# Patient Record
Sex: Female | Born: 1940
Health system: Southern US, Community
[De-identification: ages and names within clinical notes are randomized; demographics above are authoritative.]

## PROBLEM LIST (undated history)

## (undated) DIAGNOSIS — N189 Chronic kidney disease, unspecified: Secondary | ICD-10-CM

## (undated) DIAGNOSIS — I10 Essential (primary) hypertension: Secondary | ICD-10-CM

## (undated) DIAGNOSIS — F0390 Unspecified dementia without behavioral disturbance: Secondary | ICD-10-CM

## (undated) DIAGNOSIS — R51 Headache: Secondary | ICD-10-CM

## (undated) DIAGNOSIS — D649 Anemia, unspecified: Secondary | ICD-10-CM

## (undated) DIAGNOSIS — K219 Gastro-esophageal reflux disease without esophagitis: Secondary | ICD-10-CM

## (undated) DIAGNOSIS — U071 COVID-19: Secondary | ICD-10-CM

## (undated) DIAGNOSIS — M1990 Unspecified osteoarthritis, unspecified site: Secondary | ICD-10-CM

## (undated) DIAGNOSIS — E119 Type 2 diabetes mellitus without complications: Secondary | ICD-10-CM

## (undated) DIAGNOSIS — R519 Headache, unspecified: Secondary | ICD-10-CM

## (undated) DIAGNOSIS — M109 Gout, unspecified: Secondary | ICD-10-CM

## (undated) HISTORY — PX: DIALYSIS/PERMA CATHETER INSERTION: CATH118288

## (undated) HISTORY — PX: ABDOMINAL HYSTERECTOMY: SHX81

---

## 2000-05-15 ENCOUNTER — Encounter: Payer: Self-pay | Admitting: Nephrology

## 2000-05-15 ENCOUNTER — Encounter: Admission: RE | Admit: 2000-05-15 | Discharge: 2000-05-15 | Payer: Self-pay | Admitting: Nephrology

## 2007-09-17 ENCOUNTER — Ambulatory Visit (HOSPITAL_COMMUNITY): Admission: RE | Admit: 2007-09-17 | Discharge: 2007-09-17 | Payer: Self-pay | Admitting: Internal Medicine

## 2007-10-04 ENCOUNTER — Ambulatory Visit (HOSPITAL_COMMUNITY): Admission: RE | Admit: 2007-10-04 | Discharge: 2007-10-04 | Payer: Self-pay | Admitting: Internal Medicine

## 2010-05-02 ENCOUNTER — Other Ambulatory Visit: Admission: RE | Admit: 2010-05-02 | Discharge: 2010-05-02 | Payer: Self-pay | Admitting: Internal Medicine

## 2012-09-17 ENCOUNTER — Other Ambulatory Visit: Payer: Self-pay | Admitting: Internal Medicine

## 2012-09-17 DIAGNOSIS — Z780 Asymptomatic menopausal state: Secondary | ICD-10-CM

## 2012-09-17 DIAGNOSIS — Z1231 Encounter for screening mammogram for malignant neoplasm of breast: Secondary | ICD-10-CM

## 2012-10-03 ENCOUNTER — Ambulatory Visit
Admission: RE | Admit: 2012-10-03 | Discharge: 2012-10-03 | Disposition: A | Discharge status: Home / Self Care | Payer: 59 | Source: Ambulatory Visit | Attending: Internal Medicine | Admitting: Internal Medicine

## 2012-10-03 DIAGNOSIS — Z1231 Encounter for screening mammogram for malignant neoplasm of breast: Secondary | ICD-10-CM

## 2012-10-03 DIAGNOSIS — Z780 Asymptomatic menopausal state: Secondary | ICD-10-CM

## 2014-03-04 ENCOUNTER — Encounter (HOSPITAL_COMMUNITY): Payer: Self-pay | Admitting: Emergency Medicine

## 2014-03-04 ENCOUNTER — Inpatient Hospital Stay (HOSPITAL_COMMUNITY)
Admission: EM | Admit: 2014-03-04 | Discharge: 2014-03-06 | DRG: 684 | Disposition: A | Discharge status: Home / Self Care | Payer: No Typology Code available for payment source | Attending: Internal Medicine | Admitting: Internal Medicine

## 2014-03-04 DIAGNOSIS — N182 Chronic kidney disease, stage 2 (mild): Secondary | ICD-10-CM | POA: Diagnosis present

## 2014-03-04 DIAGNOSIS — IMO0002 Reserved for concepts with insufficient information to code with codable children: Secondary | ICD-10-CM

## 2014-03-04 DIAGNOSIS — Z9119 Patient's noncompliance with other medical treatment and regimen: Secondary | ICD-10-CM

## 2014-03-04 DIAGNOSIS — E785 Hyperlipidemia, unspecified: Secondary | ICD-10-CM | POA: Diagnosis present

## 2014-03-04 DIAGNOSIS — N179 Acute kidney failure, unspecified: Principal | ICD-10-CM

## 2014-03-04 DIAGNOSIS — I129 Hypertensive chronic kidney disease with stage 1 through stage 4 chronic kidney disease, or unspecified chronic kidney disease: Secondary | ICD-10-CM | POA: Diagnosis present

## 2014-03-04 DIAGNOSIS — E860 Dehydration: Secondary | ICD-10-CM

## 2014-03-04 DIAGNOSIS — Z91199 Patient's noncompliance with other medical treatment and regimen due to unspecified reason: Secondary | ICD-10-CM

## 2014-03-04 DIAGNOSIS — M109 Gout, unspecified: Secondary | ICD-10-CM | POA: Diagnosis present

## 2014-03-04 DIAGNOSIS — E1365 Other specified diabetes mellitus with hyperglycemia: Secondary | ICD-10-CM | POA: Diagnosis present

## 2014-03-04 DIAGNOSIS — E1349 Other specified diabetes mellitus with other diabetic neurological complication: Secondary | ICD-10-CM | POA: Diagnosis present

## 2014-03-04 DIAGNOSIS — Z91148 Patient's other noncompliance with medication regimen for other reason: Secondary | ICD-10-CM

## 2014-03-04 DIAGNOSIS — R739 Hyperglycemia, unspecified: Secondary | ICD-10-CM

## 2014-03-04 DIAGNOSIS — R7309 Other abnormal glucose: Secondary | ICD-10-CM

## 2014-03-04 DIAGNOSIS — Z9114 Patient's other noncompliance with medication regimen: Secondary | ICD-10-CM

## 2014-03-04 DIAGNOSIS — R197 Diarrhea, unspecified: Secondary | ICD-10-CM

## 2014-03-04 DIAGNOSIS — I10 Essential (primary) hypertension: Secondary | ICD-10-CM

## 2014-03-04 DIAGNOSIS — Z794 Long term (current) use of insulin: Secondary | ICD-10-CM

## 2014-03-04 HISTORY — DX: Type 2 diabetes mellitus without complications: E11.9

## 2014-03-04 LAB — COMPREHENSIVE METABOLIC PANEL
ALK PHOS: 122 U/L — AB (ref 39–117)
ALT: 12 U/L (ref 0–35)
AST: 12 U/L (ref 0–37)
Albumin: 3.3 g/dL — ABNORMAL LOW (ref 3.5–5.2)
BUN: 58 mg/dL — ABNORMAL HIGH (ref 6–23)
CALCIUM: 9.2 mg/dL (ref 8.4–10.5)
CO2: 20 meq/L (ref 19–32)
Chloride: 100 mEq/L (ref 96–112)
Creatinine, Ser: 2.92 mg/dL — ABNORMAL HIGH (ref 0.50–1.10)
GFR, EST AFRICAN AMERICAN: 18 mL/min — AB (ref >90)
GFR, EST NON AFRICAN AMERICAN: 15 mL/min — AB (ref >90)
GLUCOSE: 156 mg/dL — AB (ref 70–99)
POTASSIUM: 4 meq/L (ref 3.7–5.3)
SODIUM: 136 meq/L — AB (ref 137–147)
TOTAL PROTEIN: 7.5 g/dL (ref 6.0–8.3)
Total Bilirubin: <0.2 mg/dL — ABNORMAL LOW (ref 0.3–1.2)

## 2014-03-04 LAB — CBC WITH DIFFERENTIAL/PLATELET
BASOS ABS: 0 10*3/uL (ref 0.0–0.1)
Basophils Relative: 0 % (ref 0–1)
EOS PCT: 2 % (ref 0–5)
Eosinophils Absolute: 0.1 10*3/uL (ref 0.0–0.7)
HCT: 33.4 % — ABNORMAL LOW (ref 36.0–46.0)
Hemoglobin: 11.1 g/dL — ABNORMAL LOW (ref 12.0–15.0)
LYMPHS ABS: 1.6 10*3/uL (ref 0.7–4.0)
LYMPHS PCT: 25 % (ref 12–46)
MCH: 27.1 pg (ref 26.0–34.0)
MCHC: 33.2 g/dL (ref 30.0–36.0)
MCV: 81.5 fL (ref 78.0–100.0)
Monocytes Absolute: 0.4 10*3/uL (ref 0.1–1.0)
Monocytes Relative: 7 % (ref 3–12)
NEUTROS PCT: 66 % (ref 43–77)
Neutro Abs: 4.1 10*3/uL (ref 1.7–7.7)
PLATELETS: 266 10*3/uL (ref 150–400)
RBC: 4.1 MIL/uL (ref 3.87–5.11)
RDW: 13.9 % (ref 11.5–15.5)
WBC: 6.3 10*3/uL (ref 4.0–10.5)

## 2014-03-04 LAB — CBG MONITORING, ED
Glucose-Capillary: 147 mg/dL — ABNORMAL HIGH (ref 70–99)
Glucose-Capillary: 96 mg/dL (ref 70–99)

## 2014-03-04 LAB — GLUCOSE, CAPILLARY: Glucose-Capillary: 187 mg/dL — ABNORMAL HIGH (ref 70–99)

## 2014-03-04 MED ORDER — SODIUM CHLORIDE 0.9 % IV SOLN
INTRAVENOUS | Status: DC
  Administered 2014-03-05: 800 mL via INTRAVENOUS
  Administered 2014-03-05: 01:00:00 via INTRAVENOUS

## 2014-03-04 MED ORDER — EZETIMIBE 10 MG PO TABS
10.0000 mg | ORAL_TABLET | Freq: Every day | ORAL | Status: DC
  Administered 2014-03-05 – 2014-03-06 (×2): 10 mg via ORAL
  Filled 2014-03-04 (×2): qty 1

## 2014-03-04 MED ORDER — INSULIN ASPART 100 UNIT/ML ~~LOC~~ SOLN
0.0000 [IU] | Freq: Three times a day (TID) | SUBCUTANEOUS | Status: DC
  Administered 2014-03-05 (×2): 5 [IU] via SUBCUTANEOUS
  Administered 2014-03-06: 3 [IU] via SUBCUTANEOUS

## 2014-03-04 MED ORDER — PNEUMOCOCCAL VAC POLYVALENT 25 MCG/0.5ML IJ INJ
0.5000 mL | INJECTION | INTRAMUSCULAR | Status: DC
  Filled 2014-03-04: qty 0.5

## 2014-03-04 MED ORDER — AMLODIPINE BESYLATE 10 MG PO TABS
10.0000 mg | ORAL_TABLET | Freq: Every day | ORAL | Status: DC
  Administered 2014-03-05 – 2014-03-06 (×2): 10 mg via ORAL
  Filled 2014-03-04 (×2): qty 1

## 2014-03-04 MED ORDER — SODIUM CHLORIDE 0.9 % IV SOLN: INTRAVENOUS | Status: DC

## 2014-03-04 MED ORDER — INSULIN DETEMIR 100 UNIT/ML ~~LOC~~ SOLN
20.0000 [IU] | Freq: Every day | SUBCUTANEOUS | Status: DC
  Administered 2014-03-05 (×2): 20 [IU] via SUBCUTANEOUS
  Filled 2014-03-04 (×3): qty 0.2

## 2014-03-04 MED ORDER — SODIUM CHLORIDE 0.9 % IV BOLUS (SEPSIS)
1000.0000 mL | Freq: Once | INTRAVENOUS | Status: AC
  Administered 2014-03-04: 1000 mL via INTRAVENOUS

## 2014-03-04 MED ORDER — ALLOPURINOL 100 MG PO TABS
100.0000 mg | ORAL_TABLET | Freq: Every day | ORAL | Status: DC
  Administered 2014-03-05 – 2014-03-06 (×2): 100 mg via ORAL
  Filled 2014-03-04 (×2): qty 1

## 2014-03-04 NOTE — Progress Notes (Signed)
Pt admitted to the unit. Pt is alert and oriented. Pt oriented to room, staff, and call bell. Bed in lowest position. Full assessment to Epic. Call bell with in reach. Told to call for assists. Will continue to monitor.  Abbagayle Zaragoza E  

## 2014-03-04 NOTE — ED Notes (Signed)
Nurse First Rounds : Nurse explained delay , wait time and process to pt. Respirations unlabored / VSS / no distress.

## 2014-03-04 NOTE — ED Notes (Addendum)
Pt in stating her CBG was 532 at her MD office and he gave her some insulin, she has since developed bilateral lower leg pain and cramping, denies other symptoms with this, no distress noted, states she has been having trouble controlling her CBG since some recent medication changes, denies pain at this time.

## 2014-03-04 NOTE — H&P (Signed)
PCP:   Foye Spurling, MD   Chief Complaint:  Uncontrolled sugar  HPI: 73 yo female h/o dm, htn, renal insuff last cr 1.5 per pcp report comes in from pcp office for leg cramps and uncontrolled diabetes.  Pt was recently started on lantus but her insurance has been refusing to pay for unclear reasons so she has not been able to take lantus.  She went to pcp today, her glucose was over 400, a short acting form of insulin was given unknown dosage, she came to ed for legs cramping.  By the time she was seen in ED, her glucose had normalized but she was found to be in worsening renal failure.  edp called pcp and found out her cr is normally around 1.5.  Pt states she had about 24 hours of mild diarrhea one week ago which has since resolved.  No n/v.  No abd pain.  No dysuria.  No suprapubic pain, no foul smelling urine.  No swelling.  Her leg cramps are gone.  Is getting her first liter of ivf now.  She is hungry.  Review of Systems:  Positive and negative as per HPI otherwise all other systems are negative  Past Medical History: Past Medical History  Diagnosis Date  . Diabetes mellitus without complication    History reviewed. No pertinent past surgical history.  Medications: Prior to Admission medications   Medication Sig Start Date End Date Taking? Authorizing Provider  allopurinol (ZYLOPRIM) 100 MG tablet Take 100 mg by mouth daily.   Yes Historical Provider, MD  amLODipine (NORVASC) 10 MG tablet Take 10 mg by mouth daily.   Yes Historical Provider, MD  ezetimibe (ZETIA) 10 MG tablet Take 10 mg by mouth daily.   Yes Historical Provider, MD  insulin detemir (LEVEMIR) 100 UNIT/ML injection Inject 20 Units into the skin at bedtime.   Yes Historical Provider, MD  valsartan-hydrochlorothiazide (DIOVAN-HCT) 320-12.5 MG per tablet Take 1 tablet by mouth daily.   Yes Historical Provider, MD    Allergies:  No Known Allergies  Social History:  reports that she has never smoked. She does not  have any smokeless tobacco history on file. Her alcohol and drug histories are not on file.  Family History: History reviewed. No pertinent family history.  Physical Exam: Filed Vitals:   03/04/14 1517 03/04/14 1935 03/04/14 2030 03/04/14 2117  BP: 142/59 150/64 170/63   Pulse: 98 94 92   Temp: 98.4 F (36.9 C) 98.1 F (36.7 C)    TempSrc: Oral Oral    Resp: 20 14    SpO2: 99% 100% 100% 100%   General appearance: alert, cooperative and no distress Head: Normocephalic, without obvious abnormality, atraumatic Eyes: negative Nose: Nares normal. Septum midline. Mucosa normal. No drainage or sinus tenderness. Neck: no JVD and supple, symmetrical, trachea midline Lungs: clear to auscultation bilaterally Heart: regular rate and rhythm, S1, S2 normal, no murmur, click, rub or gallop Abdomen: soft, non-tender; bowel sounds normal; no masses,  no organomegaly Extremities: extremities normal, atraumatic, no cyanosis or edema Pulses: 2+ and symmetric Skin: Skin color, texture, turgor normal. No rashes or lesions Neurologic: Grossly normal    Labs on Admission:   Recent Labs  03/04/14 1537  NA 136*  K 4.0  CL 100  CO2 20  GLUCOSE 156*  BUN 58*  CREATININE 2.92*  CALCIUM 9.2    Recent Labs  03/04/14 1537  AST 12  ALT 12  ALKPHOS 122*  BILITOT <0.2*  PROT 7.5  ALBUMIN 3.3*    Recent Labs  03/04/14 1537  WBC 6.3  NEUTROABS 4.1  HGB 11.1*  HCT 33.4*  MCV 81.5  PLT 266    Radiological Exams on Admission: No results found.  Assessment/Plan  73 yo female with uncontrolled diabetes and worsening renal failure  Principal Problem:   Acute renal failure-  Does not sound like the diarrhea she had last week was significant enough to cause this much of a bump in her renal function.  May be progression of ckd due to uncontrolled chronic illnesses.  Will bolus 2 liter ivf and place on ivf overnight.  Will ck ua, osm studies and spot urine sodium.  Repeat labs in am.   Also order renal u/s.  No evidence of uremia at this time.    Active Problems:   Drug noncompliance due to insurance refusing to pay outpt medications, perhaps sw consult can help in finding out why pt is having a hard time getting her lantus paid for.   Diarrhea  resolved   DM (diabetes mellitus), secondary uncontrolled  Ssi.  lantus  Obs, full code.      Denesha Brouse A 03/04/2014, 10:07 PM

## 2014-03-04 NOTE — ED Notes (Addendum)
Pt resting in bed talking in clear complete sentences.  Pt states no complaints at this time, and that she is ready to go home.

## 2014-03-04 NOTE — ED Provider Notes (Signed)
CSN: [id number]     Arrival date & time 03/04/14  1438 History   First MD Initiated Contact with Patient 03/04/14 2036     Chief Complaint  Patient presents with  . Hyperglycemia     (Consider location/radiation/quality/duration/timing/severity/associated sxs/prior Treatment) HPI Comments: 73 yo female with DM on levemir, htn presents with hyperglycemia.  Pt was seen in the office for hyperglycemia and cramping.  Pt had Insulin in office however glucose continued to be elevated.  Pt has had similar in the past, pcp and pt trying to improve as difficulties with medicine insurance coverage.  Pt back on lantus.  Glu improved to normal in ED.  Pt has no sxs currently.  Mild recent diarrhea however tolerating normal po.  No nsaid use.  Pt has kidney dz hx per her however unsure extent or Cr.    Patient is a 73 y.o. female presenting with hyperglycemia. The history is provided by the patient.  Hyperglycemia Associated symptoms: no abdominal pain, no chest pain, no dysuria, no fever, no shortness of breath and no vomiting     Past Medical History  Diagnosis Date  . Diabetes mellitus without complication    History reviewed. No pertinent past surgical history. History reviewed. No pertinent family history. History  Substance Use Topics  . Smoking status: Never Smoker   . Smokeless tobacco: Not on file  . Alcohol Use: Not on file   OB History   Grav Para Term Preterm Abortions TAB SAB Ect Mult Living                 Review of Systems  Constitutional: Negative for fever and chills.  HENT: Negative for congestion.   Eyes: Negative for visual disturbance.  Respiratory: Negative for shortness of breath.   Cardiovascular: Negative for chest pain.  Gastrointestinal: Negative for vomiting and abdominal pain.  Genitourinary: Negative for dysuria and flank pain.  Musculoskeletal: Positive for myalgias. Negative for back pain, neck pain and neck stiffness.  Skin: Negative for rash.   Neurological: Negative for light-headedness and headaches.      Allergies  Review of patient's allergies indicates no known allergies.  Home Medications   Current Outpatient Rx  Name  Route  Sig  Dispense  Refill  . allopurinol (ZYLOPRIM) 100 MG tablet   Oral   Take 100 mg by mouth daily.         Marland Kitchen amLODipine (NORVASC) 10 MG tablet   Oral   Take 10 mg by mouth daily.         Marland Kitchen ezetimibe (ZETIA) 10 MG tablet   Oral   Take 10 mg by mouth daily.         . insulin detemir (LEVEMIR) 100 UNIT/ML injection   Subcutaneous   Inject 20 Units into the skin at bedtime.         . valsartan-hydrochlorothiazide (DIOVAN-HCT) 320-12.5 MG per tablet   Oral   Take 1 tablet by mouth daily.          BP 170/63  Pulse 92  Temp(Src) 98.1 F (36.7 C) (Oral)  Resp 14  SpO2 100% Physical Exam  Nursing note and vitals reviewed. Constitutional: She is oriented to person, place, and time. She appears well-developed and well-nourished.  HENT:  Head: Normocephalic and atraumatic.  Mild dry mm  Eyes: Conjunctivae are normal. Right eye exhibits no discharge. Left eye exhibits no discharge.  Neck: Normal range of motion. Neck supple. No tracheal deviation present.  Cardiovascular: Normal rate and  regular rhythm.   Pulmonary/Chest: Effort normal and breath sounds normal.  Abdominal: Soft. She exhibits no distension. There is no tenderness. There is no guarding.  Musculoskeletal: She exhibits no edema.  Neurological: She is alert and oriented to person, place, and time.  Skin: Skin is warm. No rash noted.  Psychiatric: She has a normal mood and affect.    ED Course  Procedures (including critical care time) Labs Review Labs Reviewed  CBC WITH DIFFERENTIAL - Abnormal; Notable for the following:    Hemoglobin 11.1 (*)    HCT 33.4 (*)    All other components within normal limits  COMPREHENSIVE METABOLIC PANEL - Abnormal; Notable for the following:    Sodium 136 (*)    Glucose,  Bld 156 (*)    BUN 58 (*)    Creatinine, Ser 2.92 (*)    Albumin 3.3 (*)    Alkaline Phosphatase 122 (*)    Total Bilirubin <0.2 (*)    GFR calc non Af Amer 15 (*)    GFR calc Af Amer 18 (*)    All other components within normal limits  CBG MONITORING, ED - Abnormal; Notable for the following:    Glucose-Capillary 147 (*)    All other components within normal limits  CBG MONITORING, ED   Imaging Review No results found.   EKG Interpretation None      MDM   Final diagnoses:  Dehydration  Hyperglycemia  ARF (acute renal failure)   Glu improved from treatment at pcp office. Busy in the ED, pt waited for hours to be seen.   ARF with elevated BUN, recent diarrhea, clinically dehydrated on top of likely CRF from dm and htn. Recommended IV fluids and observation, pt refuses IV and refuses admission, Patient has capacity to make decisions, understands benefits of hospitalization and risks of going home may result in worsening health condition including worsening kidney fx.  Patient refuses hospital placement. Patient understands they may return at any time.    Discussed with pcp, he spoke with her on the phone and recommended observation for fluids/ recheck blood work in am, pt agreed to stay. IV fluids given.    The patients results and plan were reviewed and discussed.   Any x-rays performed were personally reviewed by myself.   Differential diagnosis were considered with the presenting HPI.   Filed Vitals:   03/05/14 1447 03/05/14 2147 03/06/14 0300 03/06/14 0959  BP: 168/67 157/78 148/67 132/80  Pulse: 100 87 86   Temp: 98.6 F (37 C) 98.3 F (36.8 C) 97.7 F (36.5 C)   TempSrc: Oral Oral Oral   Resp: 18 18 20    Height:      Weight:      SpO2: 99% 98% 99%     Admission/ observation were discussed with the admitting physician, patient and/or family and they are comfortable with the plan.       Mariea Clonts, MD 03/07/14 [phone]

## 2014-03-05 ENCOUNTER — Observation Stay (HOSPITAL_COMMUNITY): Payer: No Typology Code available for payment source

## 2014-03-05 DIAGNOSIS — I10 Essential (primary) hypertension: Secondary | ICD-10-CM

## 2014-03-05 LAB — URINALYSIS, ROUTINE W REFLEX MICROSCOPIC
Bilirubin Urine: NEGATIVE
Glucose, UA: 100 mg/dL — AB
KETONES UR: NEGATIVE mg/dL
Leukocytes, UA: NEGATIVE
NITRITE: NEGATIVE
Protein, ur: 100 mg/dL — AB
Specific Gravity, Urine: 1.013 (ref 1.005–1.030)
Urobilinogen, UA: 0.2 mg/dL (ref 0.0–1.0)
pH: 5 (ref 5.0–8.0)

## 2014-03-05 LAB — CBC
HEMATOCRIT: 30.3 % — AB (ref 36.0–46.0)
Hemoglobin: 9.9 g/dL — ABNORMAL LOW (ref 12.0–15.0)
MCH: 26.7 pg (ref 26.0–34.0)
MCHC: 32.7 g/dL (ref 30.0–36.0)
MCV: 81.7 fL (ref 78.0–100.0)
Platelets: 231 10*3/uL (ref 150–400)
RBC: 3.71 MIL/uL — ABNORMAL LOW (ref 3.87–5.11)
RDW: 13.9 % (ref 11.5–15.5)
WBC: 6.1 10*3/uL (ref 4.0–10.5)

## 2014-03-05 LAB — GLUCOSE, CAPILLARY
Glucose-Capillary: 81 mg/dL (ref 70–99)
Glucose-Capillary: 296 mg/dL — ABNORMAL HIGH (ref 70–99)
Glucose-Capillary: 282 mg/dL — ABNORMAL HIGH (ref 70–99)
Glucose-Capillary: 207 mg/dL — ABNORMAL HIGH (ref 70–99)

## 2014-03-05 LAB — BASIC METABOLIC PANEL
BUN: 48 mg/dL — AB (ref 6–23)
CO2: 20 meq/L (ref 19–32)
CREATININE: 2.55 mg/dL — AB (ref 0.50–1.10)
Calcium: 8.5 mg/dL (ref 8.4–10.5)
Chloride: 108 mEq/L (ref 96–112)
GFR calc non Af Amer: 18 mL/min — ABNORMAL LOW (ref >90)
GFR, EST AFRICAN AMERICAN: 21 mL/min — AB (ref >90)
Glucose, Bld: 166 mg/dL — ABNORMAL HIGH (ref 70–99)
Potassium: 3.8 mEq/L (ref 3.7–5.3)
Sodium: 141 mEq/L (ref 137–147)

## 2014-03-05 LAB — PROTEIN / CREATININE RATIO, URINE
Creatinine, Urine: 33.69 mg/dL
Protein Creatinine Ratio: 2.64 — ABNORMAL HIGH (ref 0.00–0.15)
Total Protein, Urine: 88.8 mg/dL

## 2014-03-05 LAB — URINE MICROSCOPIC-ADD ON

## 2014-03-05 LAB — OSMOLALITY, URINE: OSMOLALITY UR: 243 mosm/kg — AB (ref 390–1090)

## 2014-03-05 LAB — OSMOLALITY: OSMOLALITY: 307 mosm/kg — AB (ref 275–300)

## 2014-03-05 LAB — HEMOGLOBIN A1C
Hgb A1c MFr Bld: 14.4 % — ABNORMAL HIGH (ref <5.7)
Mean Plasma Glucose: 367 mg/dL — ABNORMAL HIGH (ref <117)

## 2014-03-05 LAB — SODIUM, URINE, RANDOM: Sodium, Ur: 48 mEq/L

## 2014-03-05 MED ORDER — HEPARIN SODIUM (PORCINE) 5000 UNIT/ML IJ SOLN
5000.0000 [IU] | Freq: Three times a day (TID) | INTRAMUSCULAR | Status: DC
  Administered 2014-03-05 – 2014-03-06 (×3): 5000 [IU] via SUBCUTANEOUS
  Filled 2014-03-05 (×9): qty 1

## 2014-03-05 NOTE — Progress Notes (Signed)
UR completed. Patient changed to inpatient- requiring IVF @75cc /hr for elevated creatinine

## 2014-03-05 NOTE — Progress Notes (Signed)
PATIENT DETAILS Name: Katie Garcia Age: 73 y.o. Sex: female Date of Birth: Dec 23, 1941 Admit Date: 03/04/2014 Admitting Physician Phillips Grout, MD [id number] S, MD  Subjective: No major complaints overnight. No further leg cramps.  Assessment/Plan: Principal Problem:   Acute renal failure - Likely prerenal secondary to uncontrolled diabetes, ACE inhibitor and HCTZ and recent diarrheal illness. - Creatinine is now down pending - Continue with IV hydration, renal ultrasound negative for hydronephrosis. - Avoid nephrotoxic agents - Does have some proteinuria, given history of diabetes will obtain a spot urine protein to creatinine ratio.  Active Problems: Uncontrolled diabetes - Sugars here are currently controlled with Levemir 20 units and sliding scale insulin. - Obtain A1c  Hypertension - Given renal failure, will hold HCTZ and losartan - Continue amlodipine, we'll continue to monitor closely. If needed we'll add a beta blocker  Recent history of diarrhea - No further episodes, this is resolved.  History of gout - Continue allopurinol  History of dyslipidemia - Continue Zetia  Disposition: Remain inpatient  DVT Prophylaxis: Prophylactic Heparin  Code Status: Full code  Family Communication Numerous family members at bedside  Procedures:  None  CONSULTS:  None  Time spent 40 minutes-which includes 50% of the time with face-to-face with patient/ family and coordinating care related to the above assessment and plan.    MEDICATIONS: Scheduled Meds: . sodium chloride   Intravenous STAT  . allopurinol  100 mg Oral Daily  . amLODipine  10 mg Oral Daily  . ezetimibe  10 mg Oral Daily  . insulin aspart  0-9 Units Subcutaneous TID WC  . insulin detemir  20 Units Subcutaneous QHS  . pneumococcal 23 valent vaccine  0.5 mL Intramuscular Tomorrow-1000   Continuous Infusions: . sodium chloride 100 mL/hr at 03/05/14 0045   PRN  Meds:.  Antibiotics: Anti-infectives   None       PHYSICAL EXAM: Vital signs in last 24 hours: Filed Vitals:   03/04/14 2327 03/05/14 0500 03/05/14 0501 03/05/14 0502  BP:  155/74 162/84 157/78  Pulse:  87 93 97  Temp:  98.2 F (36.8 C)    TempSrc:  Oral    Resp:  18    Height: 5' (1.524 m)     Weight: 92.987 kg (205 lb)     SpO2:  100%      Weight change:  Filed Weights   03/04/14 2327  Weight: 92.987 kg (205 lb)   Body mass index is 40.04 kg/(m^2).   Gen Exam: Awake and alert with clear speech.   Neck: Supple, No JVD.   Chest: B/L Clear.   CVS: S1 S2 Regular, no murmurs.  Abdomen: soft, BS +, non tender, non distended.  Extremities: no edema, lower extremities warm to touch. Neurologic: Non Focal.   Skin: No Rash.   Wounds: N/A.    Intake/Output from previous day:  Intake/Output Summary (Last 24 hours) at 03/05/14 1025 Last data filed at 03/04/14 2223  Gross per 24 hour  Intake    240 ml  Output      0 ml  Net    240 ml     LAB RESULTS: CBC  Recent Labs Lab 03/04/14 1537 03/05/14 0502  WBC 6.3 6.1  HGB 11.1* 9.9*  HCT 33.4* 30.3*  PLT 266 231  MCV 81.5 81.7  MCH 27.1 26.7  MCHC 33.2 32.7  RDW 13.9 13.9  LYMPHSABS 1.6  --   MONOABS 0.4  --  EOSABS 0.1  --   BASOSABS 0.0  --     Chemistries   Recent Labs Lab 03/04/14 1537 03/05/14 0502  NA 136* 141  K 4.0 3.8  CL 100 108  CO2 20 20  GLUCOSE 156* 166*  BUN 58* 48*  CREATININE 2.92* 2.55*  CALCIUM 9.2 8.5    CBG:  Recent Labs Lab 03/04/14 1539 03/04/14 1859 03/04/14 2333 03/05/14 0915  GLUCAP 147* 96 187* 81    GFR Estimated Creatinine Clearance: 20.6 ml/min (by C-G formula based on Cr of 2.55).  Coagulation profile No results found for this basename: INR, PROTIME,  in the last 168 hours  Cardiac Enzymes No results found for this basename: CK, CKMB, TROPONINI, MYOGLOBIN,  in the last 168 hours  No components found with this basename: POCBNP,  No results found  for this basename: DDIMER,  in the last 72 hours No results found for this basename: HGBA1C,  in the last 72 hours No results found for this basename: CHOL, HDL, LDLCALC, TRIG, CHOLHDL, LDLDIRECT,  in the last 72 hours No results found for this basename: TSH, T4TOTAL, FREET3, T3FREE, THYROIDAB,  in the last 72 hours No results found for this basename: VITAMINB12, FOLATE, FERRITIN, TIBC, IRON, RETICCTPCT,  in the last 72 hours No results found for this basename: LIPASE, AMYLASE,  in the last 72 hours  Urine Studies No results found for this basename: UACOL, UAPR, USPG, UPH, UTP, UGL, UKET, UBIL, UHGB, UNIT, UROB, ULEU, UEPI, UWBC, URBC, UBAC, CAST, CRYS, UCOM, BILUA,  in the last 72 hours  MICROBIOLOGY: No results found for this or any previous visit (from the past 240 hour(s)).  RADIOLOGY STUDIES/RESULTS: US Renal  03/05/2014   CLINICAL DATA:  Renal failure.  Diabetes  EXAM: RENAL/URINARY TRACT ULTRASOUND COMPLETE  COMPARISON:  None.  FINDINGS: Right Kidney:  Length: 9.6 cm. Echogenicity within normal limits. No mass or hydronephrosis visualized.  Left Kidney:  Length: 9.3 cm. Low lying left kidney in the pelvis. Echogenicity within normal limits. No mass or hydronephrosis visualized.  Bladder:  Appears normal for degree of bladder distention.  IMPRESSION: Negative for obstruction.  Pelvic kidney on the left.   Electronically Signed   By: Franchot Gallo M.D.   On: 03/05/2014 09:40    Oren Binet, MD  [facility] Pager:[phone]  If 7PM-7AM, please contact night-coverage [email or web address] Password [email or web address] 03/05/2014, 10:25 AM   LOS: 1 day

## 2014-03-05 NOTE — Care Management Note (Signed)
    Page 1 of 1   03/06/2014     10:55:59 AM   CARE MANAGEMENT NOTE 03/06/2014  Patient:  Katie Garcia, Katie Garcia   Account Number:  [id number]  Date Initiated:  03/05/2014  Documentation initiated by:  Tomi Bamberger  Subjective/Objective Assessment:   dx arf, uncontrolled dm  admit- lives with spouse.     Action/Plan:   Anticipated DC Date:  03/06/2014   Anticipated DC Plan:  HOME/SELF CARE      DC Planning Services  CM consult      Choice offered to / List presented to:             Status of service:  Completed, signed off Medicare Important Message given?   (If response is "NO", the following Medicare IM given date fields will be blank) Date Medicare IM given:   Date Additional Medicare IM given:    Discharge Disposition:  HOME/SELF CARE  Per UR Regulation:    If discussed at Long Length of Stay Meetings, dates discussed:    Comments:  03/06/14 [place], BSN [phone] patient for dc today.  03/05/14 [place], BSN [phone] patient lives with spouse, NCM did a benefit check on lantus insulin and levemir insulin, per her insurance , patient has a deductible of  $2500.00  and has met $ 1496.59 of that,  her co pay is $258.72, pateint will need to use lantus first for 30 days , if patient use levemir insulin will need to get MD prior approval.   NCM informed patient of this information, she will pay the co pay.

## 2014-03-06 LAB — BASIC METABOLIC PANEL
BUN: 34 mg/dL — AB (ref 6–23)
CO2: 21 meq/L (ref 19–32)
Calcium: 8.7 mg/dL (ref 8.4–10.5)
Chloride: 111 mEq/L (ref 96–112)
Creatinine, Ser: 2.02 mg/dL — ABNORMAL HIGH (ref 0.50–1.10)
GFR calc Af Amer: 27 mL/min — ABNORMAL LOW (ref >90)
GFR calc non Af Amer: 24 mL/min — ABNORMAL LOW (ref >90)
Glucose, Bld: 81 mg/dL (ref 70–99)
Potassium: 4.4 mEq/L (ref 3.7–5.3)
Sodium: 144 mEq/L (ref 137–147)

## 2014-03-06 LAB — GLUCOSE, CAPILLARY
GLUCOSE-CAPILLARY: 245 mg/dL — AB (ref 70–99)
Glucose-Capillary: 92 mg/dL (ref 70–99)

## 2014-03-06 MED ORDER — INSULIN DETEMIR 100 UNIT/ML ~~LOC~~ SOLN: 20.0000 [IU] | Freq: Every day | SUBCUTANEOUS | Status: DC

## 2014-03-06 NOTE — Progress Notes (Signed)
Katie Garcia to be D/C'd Home per MD order.  Discussed with the patient and all questions fully answered.    Medication List    STOP taking these medications       valsartan-hydrochlorothiazide 320-12.5 MG per tablet  Commonly known as:  DIOVAN-HCT      TAKE these medications       allopurinol 100 MG tablet  Commonly known as:  ZYLOPRIM  Take 100 mg by mouth daily.     amLODipine 10 MG tablet  Commonly known as:  NORVASC  Take 10 mg by mouth daily.     ezetimibe 10 MG tablet  Commonly known as:  ZETIA  Take 10 mg by mouth daily.     insulin detemir 100 UNIT/ML injection  Commonly known as:  LEVEMIR  Inject 0.2 mLs (20 Units total) into the skin at bedtime.        VVS, Skin clean, dry and intact without evidence of skin break down, no evidence of skin tears noted. IV catheter discontinued intact. Site without signs and symptoms of complications. Dressing and pressure applied.  An After Visit Summary was printed and given to the patient.  D/c education completed with patient/family including follow up instructions, medication list, d/c activities limitations if indicated, with other d/c instructions as indicated by MD - patient able to verbalize understanding, all questions fully answered.   Patient instructed to return to ED, call 911, or call MD for any changes in condition.   Patient escorted via [place], and D/C home via private auto.  Audria Nine F 03/06/2014 1:42 PM

## 2014-03-06 NOTE — Discharge Summary (Signed)
PATIENT DETAILS Name: Katie Garcia Age: 73 y.o. Sex: female Date of Birth: 1941-05-09 MRN: [id number]. Admit Date: 03/04/2014 Admitting Physician: Phillips Grout, MD [id number] S, MD  Recommendations for Outpatient Follow-up:  1. Please check chemistry panel at next followup with PCP 2. Continue to optimize diabetic regimen. 3. Suspected diabetic nephropathy, may need referral to nephrology, we'll defer this to primary care practitioner 4. Once creatinine is back to baseline, would benefit from resumption of ACE inhibitor.  PRIMARY DISCHARGE DIAGNOSIS:  Principal Problem:   Acute renal failure Active Problems:   Drug noncompliance   Diarrhea   DM (diabetes mellitus), secondary uncontrolled   ARF (acute renal failure)   Renal failure, acute      PAST MEDICAL HISTORY: Past Medical History  Diagnosis Date  . Diabetes mellitus without complication     DISCHARGE MEDICATIONS:   Medication List    STOP taking these medications       valsartan-hydrochlorothiazide 320-12.5 MG per tablet  Commonly known as:  DIOVAN-HCT      TAKE these medications       allopurinol 100 MG tablet  Commonly known as:  ZYLOPRIM  Take 100 mg by mouth daily.     amLODipine 10 MG tablet  Commonly known as:  NORVASC  Take 10 mg by mouth daily.     ezetimibe 10 MG tablet  Commonly known as:  ZETIA  Take 10 mg by mouth daily.     insulin detemir 100 UNIT/ML injection  Commonly known as:  LEVEMIR  Inject 0.2 mLs (20 Units total) into the skin at bedtime.        ALLERGIES:  No Known Allergies  BRIEF HPI:  See H&P, Labs, Consult and Test reports for all details in brief, patient is a 73 year old female with history of diabetes, hypertension, reported history of chronic kidney disease stage II with a baseline creatinine of around 1.5 who presented to the ED after she was found to have a sugar of more than 400 at her primary care practitioner's office, she reportedly was given a  dose of NovoLog at the primary care practitioner's office following which she started having leg cramps. She was then brought to the emergency room, where she was found to have a creatinine of 2.9. She was then admitted for further evaluation and treatment  CONSULTATIONS:   None  PERTINENT RADIOLOGIC STUDIES: US Renal  03/05/2014   CLINICAL DATA:  Renal failure.  Diabetes  EXAM: RENAL/URINARY TRACT ULTRASOUND COMPLETE  COMPARISON:  None.  FINDINGS: Right Kidney:  Length: 9.6 cm. Echogenicity within normal limits. No mass or hydronephrosis visualized.  Left Kidney:  Length: 9.3 cm. Low lying left kidney in the pelvis. Echogenicity within normal limits. No mass or hydronephrosis visualized.  Bladder:  Appears normal for degree of bladder distention.  IMPRESSION: Negative for obstruction.  Pelvic kidney on the left.   Electronically Signed   By: Franchot Gallo M.D.   On: 03/05/2014 09:40     PERTINENT LAB RESULTS: CBC:  Recent Labs  03/04/14 1537 03/05/14 0502  WBC 6.3 6.1  HGB 11.1* 9.9*  HCT 33.4* 30.3*  PLT 266 231   CMET CMP     Component Value Date/Time   NA 144 03/06/2014 0640   K 4.4 03/06/2014 0640   CL 111 03/06/2014 0640   CO2 21 03/06/2014 0640   GLUCOSE 81 03/06/2014 0640   BUN 34* 03/06/2014 0640   CREATININE 2.02* 03/06/2014 0640   CALCIUM 8.7 03/06/2014 0640  PROT 7.5 03/04/2014 1537   ALBUMIN 3.3* 03/04/2014 1537   AST 12 03/04/2014 1537   ALT 12 03/04/2014 1537   ALKPHOS 122* 03/04/2014 1537   BILITOT <0.2* 03/04/2014 1537   GFRNONAA 24* 03/06/2014 0640   GFRAA 27* 03/06/2014 0640    GFR Estimated Creatinine Clearance: 26 ml/min (by C-G formula based on Cr of 2.02). No results found for this basename: LIPASE, AMYLASE,  in the last 72 hours No results found for this basename: CKTOTAL, CKMB, CKMBINDEX, TROPONINI,  in the last 72 hours No components found with this basename: POCBNP,  No results found for this basename: DDIMER,  in the last 72 hours  Recent Labs   03/05/14 1055  HGBA1C 14.4*   No results found for this basename: CHOL, HDL, LDLCALC, TRIG, CHOLHDL, LDLDIRECT,  in the last 72 hours No results found for this basename: TSH, T4TOTAL, FREET3, T3FREE, THYROIDAB,  in the last 72 hours No results found for this basename: VITAMINB12, FOLATE, FERRITIN, TIBC, IRON, RETICCTPCT,  in the last 72 hours Coags: No results found for this basename: PT, INR,  in the last 72 hours Microbiology: No results found for this or any previous visit (from the past 240 hour(s)).   BRIEF HOSPITAL COURSE:  Acute on chronic renal failure  - Likely prerenal secondary to uncontrolled diabetes, ACE inhibitor and HCTZ and recent diarrheal illness. - She was admitted with a creatinine of 2.9, she was started on IV fluids, a renal ultrasound was negative for hydronephrosis. ACE inhibitor, HCTZ were placed on hold. With these measures, creatinine started to improve, by day of discharge it was down to 2.02. She seems to be clinically euvolemic, and suspect that creatinine will continue to improve off IV fluids. She is requesting to be discharged home, suspect that she should be stable for discharge as long as she follows up with her primary care practitioner early next week for a quick hospital followup and chemistry panel checked. Would continue to hold off on starting ACE inhibitor and HCTZ at the time of discharge. - Suspect she has some amount of diabetic nephropathy at baseline, a spot protein in her urine creatinine ratio was 2.64. I would defer further workup to be done in the outpatient setting, including a nephrology referral.  Suspected diabetic nephropathy - Urine analysis done on admission showed a protein of 100. A spot urine protein creatinine ratio is 2.64. Suspected diabetic nephropathy, reportedly according to the H&P her baseline creatinine is around 1.5. I suspect this patient may have some component of diabetic nephropathy causing her to have chronic kidney  disease. Currently because of worsening renal failure, ACE inhibitor is on hold, this should be resumed if the creatinine continues to improve in the outpatient setting. She probably may need referral to nephrology at some point as well.  Uncontrolled diabetes - CBGs were significantly elevated on admission, apparently there was an issue of her insurance not covering Lantus and she may have missed a few doses of insulin. In any event, patient was admitted, started on Levemir 20 units along with sliding scale insulin. With these measures her sugars are significantly better, on discharge she will be discharged on 20 units of Levemir. Her A1c is 14.4. We'll defer further optimization of her insulin regimen to her primary care practitioner  Hypertension  - Given renal failure, will hold HCTZ and losartan on discharge - Continue amlodipine on discharge, blood pressure seems to be reasonably controlled with a single agent. She will followup with her  primary care practitioner next few days for further optimization  Recent history of diarrhea  - No further episodes, this has resolved. She did have a few episodes of loose watery stools approximately a week before this admission, this may have contributing to worsening of renal function.  History of gout  - Continue allopurinol   History of dyslipidemia  - Continue Zetia   TODAY-DAY OF DISCHARGE:  Subjective:   Bera Beckner today has no headache,no chest abdominal pain,no new weakness tingling or numbness, feels much better wants to go home today.   Objective:   Blood pressure 132/80, pulse 86, temperature 97.7 F (36.5 C), temperature source Oral, resp. rate 20, height 5' (1.524 m), weight 92.987 kg (205 lb), SpO2 99.00%.  Intake/Output Summary (Last 24 hours) at 03/06/14 1005 Last data filed at 03/06/14 [id number]  Gross per 24 hour  Intake 1441.67 ml  Output   2600 ml  Net -1158.33 ml   Filed Weights   03/04/14 2327  Weight: 92.987 kg (205  lb)    Exam Awake Alert, Oriented *3, No new F.N deficits, Normal affect [place].AT,PERRAL Supple Neck,No JVD, No cervical lymphadenopathy appriciated.  Symmetrical Chest wall movement, Good air movement bilaterally, CTAB RRR,No Gallops,Rubs or new Murmurs, No Parasternal Heave +ve B.Sounds, Abd Soft, Non tender, No organomegaly appriciated, No rebound -guarding or rigidity. No Cyanosis, Clubbing or edema, No new Rash or bruise  DISCHARGE CONDITION: Stable  DISPOSITION: Home  DISCHARGE INSTRUCTIONS:    Activity:  As tolerated   Diet recommendation: Diabetic Diet Heart Healthy diet  Discharge Orders   Future Orders Complete By Expires   Call MD for:  persistant nausea and vomiting  As directed    Call MD for:  temperature >100.4  As directed    Diet - low sodium heart healthy  As directed    Diet Carb Modified  As directed    Increase activity slowly  As directed       Follow-up Information   Follow up with Foye Spurling, MD On 03/16/2014. (appt 12 noon)    Specialty:  Internal Medicine   Contact information:   [address] [place] [address] [phone]       Total Time spent on discharge equals 45 minutes.  SignedOren Binet 03/06/2014 10:05 AM

## 2014-03-06 NOTE — Progress Notes (Signed)
Spoke with staff RN about this patient.  Will probably go home on insulin.  HgbA1C was 14.4%. To check patient on insulin administration before discharge.  Case management working with patient on insurance coverage for insulin.  Harvel Ricks RN BSN CDE

## 2014-03-06 NOTE — Progress Notes (Signed)
Pt was able to self-administer insulin. Explained the importance of rotating sites. All questions were answered.

## 2015-06-04 ENCOUNTER — Emergency Department (HOSPITAL_COMMUNITY): Payer: BLUE CROSS/BLUE SHIELD

## 2015-06-04 ENCOUNTER — Encounter (HOSPITAL_COMMUNITY): Payer: Self-pay | Admitting: Family Medicine

## 2015-06-04 ENCOUNTER — Emergency Department (HOSPITAL_COMMUNITY)
Admission: EM | Admit: 2015-06-04 | Discharge: 2015-06-04 | Disposition: A | Discharge status: Home / Self Care | Payer: BLUE CROSS/BLUE SHIELD | Attending: Emergency Medicine | Admitting: Emergency Medicine

## 2015-06-04 DIAGNOSIS — S0003XA Contusion of scalp, initial encounter: Secondary | ICD-10-CM | POA: Insufficient documentation

## 2015-06-04 DIAGNOSIS — S62609A Fracture of unspecified phalanx of unspecified finger, initial encounter for closed fracture: Secondary | ICD-10-CM

## 2015-06-04 DIAGNOSIS — I10 Essential (primary) hypertension: Secondary | ICD-10-CM | POA: Insufficient documentation

## 2015-06-04 DIAGNOSIS — Z880 Allergy status to penicillin: Secondary | ICD-10-CM | POA: Diagnosis not present

## 2015-06-04 DIAGNOSIS — Z79899 Other long term (current) drug therapy: Secondary | ICD-10-CM | POA: Insufficient documentation

## 2015-06-04 DIAGNOSIS — Y998 Other external cause status: Secondary | ICD-10-CM | POA: Insufficient documentation

## 2015-06-04 DIAGNOSIS — W01198A Fall on same level from slipping, tripping and stumbling with subsequent striking against other object, initial encounter: Secondary | ICD-10-CM | POA: Diagnosis not present

## 2015-06-04 DIAGNOSIS — Y9301 Activity, walking, marching and hiking: Secondary | ICD-10-CM | POA: Insufficient documentation

## 2015-06-04 DIAGNOSIS — S62636A Displaced fracture of distal phalanx of right little finger, initial encounter for closed fracture: Secondary | ICD-10-CM | POA: Insufficient documentation

## 2015-06-04 DIAGNOSIS — S0990XA Unspecified injury of head, initial encounter: Secondary | ICD-10-CM | POA: Diagnosis not present

## 2015-06-04 DIAGNOSIS — S0093XA Contusion of unspecified part of head, initial encounter: Secondary | ICD-10-CM

## 2015-06-04 DIAGNOSIS — W19XXXA Unspecified fall, initial encounter: Secondary | ICD-10-CM

## 2015-06-04 DIAGNOSIS — E119 Type 2 diabetes mellitus without complications: Secondary | ICD-10-CM | POA: Diagnosis not present

## 2015-06-04 DIAGNOSIS — Z794 Long term (current) use of insulin: Secondary | ICD-10-CM | POA: Insufficient documentation

## 2015-06-04 DIAGNOSIS — Y92090 Kitchen in other non-institutional residence as the place of occurrence of the external cause: Secondary | ICD-10-CM | POA: Insufficient documentation

## 2015-06-04 DIAGNOSIS — S63256A Unspecified dislocation of right little finger, initial encounter: Secondary | ICD-10-CM

## 2015-06-04 DIAGNOSIS — S63296A Dislocation of distal interphalangeal joint of right little finger, initial encounter: Secondary | ICD-10-CM | POA: Insufficient documentation

## 2015-06-04 DIAGNOSIS — S6991XA Unspecified injury of right wrist, hand and finger(s), initial encounter: Secondary | ICD-10-CM | POA: Diagnosis present

## 2015-06-04 HISTORY — DX: Essential (primary) hypertension: I10

## 2015-06-04 LAB — CBG MONITORING, ED: GLUCOSE-CAPILLARY: 241 mg/dL — AB (ref 65–99)

## 2015-06-04 MED ORDER — ACETAMINOPHEN 325 MG PO TABS
650.0000 mg | ORAL_TABLET | Freq: Once | ORAL | Status: AC
  Administered 2015-06-04: 650 mg via ORAL
  Filled 2015-06-04: qty 2

## 2015-06-04 MED ORDER — LIDOCAINE HCL (PF) 1 % IJ SOLN: 30.0000 mL | Freq: Once | INTRAMUSCULAR | Status: DC

## 2015-06-04 NOTE — ED Notes (Signed)
Ortho notified for finger splint static

## 2015-06-04 NOTE — ED Notes (Signed)
Pt sts PTA her feet slipped out from under her and she fell striking her head on the chair leg. Denies LOC. No bleeding. sts also hurt right pinkly finger,.

## 2015-06-04 NOTE — ED Provider Notes (Signed)
Patient slipped this morning while walking. Stating "my feet went out from under me. She bumped her head as a result no loss of consciousness. Presently complains of pain at right little finger. On exam no distress Glasgow Coma Score 15 HEENT exam is small hematoma right occipital hematoma neck nontender supple right upper extremity with deformity of fifth finger. Good capillary refill. Limited range of motion of distal phalanx. Skin intact. Procedure the right little finger was reduced by me. No N aesthetic needed. Reduced by direct traction after timeout performed after reduction patient had good capillary refill and full range of motion of distal phalanx. Finger splint X-rays reviewed by me  Orlie Dakin, MD 06/04/15 [phone]

## 2015-06-04 NOTE — Discharge Instructions (Signed)
Return to the emergency room with worsening of symptoms, new symptoms or with symptoms that are concerning , especially severe worsening of headache, visual or speech changes, weakness in face, arms or legs OR redness, swelling, numbness, tingling, fevers, blue fingers. RICE: Rest, Ice (three cycles of 20 mins on, 54mins off at least twice a day), compression/brace, elevation. Heating pad works well for back pain. Tylenol for pain. Call to make follow up appointment with hand orthopedist. Please call your doctor for a followup appointment within 24-48 hours. When you talk to your doctor please let them know that you were seen in the emergency department and have them acquire all of your records so that they can discuss the findings with you and formulate a treatment plan to fully care for your new and ongoing problems.  Read below information and follow recommendations. Fall Prevention and Home Safety Falls cause injuries and can affect all age groups. It is possible to use preventive measures to significantly decrease the likelihood of falls. There are many simple measures which can make your home safer and prevent falls. OUTDOORS  Repair cracks and edges of walkways and driveways.  Remove high doorway thresholds.  Trim shrubbery on the main path into your home.  Have good outside lighting.  Clear walkways of tools, rocks, debris, and clutter.  Check that handrails are not broken and are securely fastened. Both sides of steps should have handrails.  Have leaves, snow, and ice cleared regularly.  Use sand or salt on walkways during winter months.  In the garage, clean up grease or oil spills. BATHROOM  Install night lights.  Install grab bars by the toilet and in the tub and shower.  Use non-skid mats or decals in the tub or shower.  Place a plastic non-slip stool in the shower to sit on, if needed.  Keep floors dry and clean up all water on the floor immediately.  Remove soap  buildup in the tub or shower on a regular basis.  Secure bath mats with non-slip, double-sided rug tape.  Remove throw rugs and tripping hazards from the floors. BEDROOMS  Install night lights.  Make sure a bedside light is easy to reach.  Do not use oversized bedding.  Keep a telephone by your bedside.  Have a firm chair with side arms to use for getting dressed.  Remove throw rugs and tripping hazards from the floor. KITCHEN  Keep handles on pots and pans turned toward the center of the stove. Use back burners when possible.  Clean up spills quickly and allow time for drying.  Avoid walking on wet floors.  Avoid hot utensils and knives.  Position shelves so they are not too high or low.  Place commonly used objects within easy reach.  If necessary, use a sturdy step stool with a grab bar when reaching.  Keep electrical cables out of the way.  Do not use floor polish or wax that makes floors slippery. If you must use wax, use non-skid floor wax.  Remove throw rugs and tripping hazards from the floor. STAIRWAYS  Never leave objects on stairs.  Place handrails on both sides of stairways and use them. Fix any loose handrails. Make sure handrails on both sides of the stairways are as long as the stairs.  Check carpeting to make sure it is firmly attached along stairs. Make repairs to worn or loose carpet promptly.  Avoid placing throw rugs at the top or bottom of stairways, or properly secure the rug  with carpet tape to prevent slippage. Get rid of throw rugs, if possible.  Have an electrician put in a light switch at the top and bottom of the stairs. OTHER FALL PREVENTION TIPS  Wear low-heel or rubber-soled shoes that are supportive and fit well. Wear closed toe shoes.  When using a stepladder, make sure it is fully opened and both spreaders are firmly locked. Do not climb a closed stepladder.  Add color or contrast paint or tape to grab bars and handrails in  your home. Place contrasting color strips on first and last steps.  Learn and use mobility aids as needed. Install an electrical emergency response system.  Turn on lights to avoid dark areas. Replace light bulbs that burn out immediately. Get light switches that glow.  Arrange furniture to create clear pathways. Keep furniture in the same place.  Firmly attach carpet with non-skid or double-sided tape.  Eliminate uneven floor surfaces.  Select a carpet pattern that does not visually hide the edge of steps.  Be aware of all pets. OTHER HOME SAFETY TIPS  Set the water temperature for 120 F (48.8 C).  Keep emergency numbers on or near the telephone.  Keep smoke detectors on every level of the home and near sleeping areas. Document Released: 12/01/2002 Document Revised: 06/11/2012 Document Reviewed: 03/01/2012 [employer] Patient Information 2015 [place], [place]. This information is not intended to replace advice given to you by your health care provider. Make sure you discuss any questions you have with your health care provider.  Finger Dislocation Finger dislocation is the displacement of bones in your finger at the joints. Most commonly, finger dislocation occurs at the proximal interphalangeal joint (the joint closest to your knuckle). Very strong, fibrous tissues (ligaments) and joint capsules connect the three bones of your fingers.  CAUSES Dislocation is caused by a forceful impact. This impact moves these bones off the joint and often tears your ligaments.  SYMPTOMS Symptoms of finger dislocation include:  Deformity of your finger.  Pain, with loss of movement. DIAGNOSIS  Finger dislocation is diagnosed with a physical exam. Often, X-ray exams are done to see if you have associated injuries, such as bone fractures. TREATMENT  Finger dislocations are treated by putting your bones back into position (reduction) either by manually moving the bones back into place or through  surgery. Your finger is then kept in a fixed position (immobilized) with the use of a dressing or splint for a brief period. When your ligament has to be surgically repaired, it needs to be kept in a fixed position with a dressing or splint for 1 to 2 weeks. Because joint stiffness is a long-term complication of finger dislocation, hand exercises or physical therapy to increase the range of motion and to regain strength is usually started as soon as the ligament is healed. Exercises and therapy generally last no more than 3 months. HOME CARE INSTRUCTIONS The following measures can help to reduce pain and speed up the healing process:  Rest your injured joint. Do not move until instructed otherwise by your caregiver. Avoid activities similar to the one that caused your injury.  Apply ice to your injured joint for the first day or 2 after your reduction or as directed by your caregiver. Applying ice helps to reduce inflammation and pain.  Put ice in a plastic bag.  Place a towel between your skin and the bag.  Leave the ice on for 15-20 minutes at a time, every 2 hours while you are  awake.  Elevate your hand above your heart as directed by your caregiver to reduce swelling.  Take over-the-counter or prescription medicine for pain as your caregiver instructs you. SEEK IMMEDIATE MEDICAL CARE IF:  Your dressing or splint becomes damaged.  Your pain becomes worse rather than better.  You lose feeling in your finger, or it becomes cold and white. MAKE SURE YOU:  Understand these instructions.  Will watch your condition.  Will get help right away if you are not doing well or get worse. Document Released: 12/08/2000 Document Revised: 03/04/2012 Document Reviewed: 10/01/2011 [facility] Patient Information 2015 [place], [place]. This information is not intended to replace advice given to you by your health care provider. Make sure you discuss any questions you have with your health care  provider.

## 2015-06-04 NOTE — ED Provider Notes (Signed)
CSN: [id number]     Arrival date & time 06/04/15  1348 History   First MD Initiated Contact with Patient 06/04/15 1403     Chief Complaint  Patient presents with  . Fall     (Consider location/radiation/quality/duration/timing/severity/associated sxs/prior Treatment) HPI  Jaydalee Heery Mcglown is a 74 y.o. female with PMH of diabetes, hypertension presenting with fall less than 1 hour ago. Patient states she was in the kitchen, walking around a chair and slipped landing on her buttocks I pinky finger and her head striking a chair leg. She denies any loss of consciousness. No proceeding lightheadedness or dizziness. She denies chest pain, shortness of breath, nausea, vomiting, visual changes, slurred speech, back pain. Patient endorses mild headache that developed gradually. Patient denies cardiac history.   Past Medical History  Diagnosis Date  . Diabetes mellitus without complication   . Hypertension    History reviewed. No pertinent past surgical history. History reviewed. No pertinent family history. History  Substance Use Topics  . Smoking status: Never Smoker   . Smokeless tobacco: Not on file  . Alcohol Use: Not on file   OB History    No data available     Review of Systems 10 Systems reviewed and are negative for acute change except as noted in the HPI.    Allergies  Penicillins  Home Medications   Prior to Admission medications   Medication Sig Start Date End Date Taking? Authorizing Provider  allopurinol (ZYLOPRIM) 100 MG tablet Take 100 mg by mouth daily.    Historical Provider, MD  amLODipine (NORVASC) 10 MG tablet Take 10 mg by mouth daily.    Historical Provider, MD  ezetimibe (ZETIA) 10 MG tablet Take 10 mg by mouth daily.    Historical Provider, MD  insulin detemir (LEVEMIR) 100 UNIT/ML injection Inject 0.2 mLs (20 Units total) into the skin at bedtime. 03/06/14   Shanker Kristeen Mans, MD   BP 180/64 mmHg  Pulse 83  Temp(Src) 97.9 F (36.6 C)  Resp 18  SpO2  99% Physical Exam  Constitutional: She appears well-developed and well-nourished. No distress.  HENT:  Head: Normocephalic.  Mouth/Throat: Oropharynx is clear and moist.  No malocclusion, no mid-face tenderness  1 cm right occipital hematoma without overlying laceration.  Eyes: Conjunctivae and EOM are normal. Pupils are equal, round, and reactive to light. Right eye exhibits no discharge. Left eye exhibits no discharge.  Cardiovascular: Normal rate and regular rhythm.   Pulmonary/Chest: Effort normal and breath sounds normal. No respiratory distress. She has no wheezes.  Abdominal: Soft. Bowel sounds are normal. She exhibits no distension. There is no tenderness.  Musculoskeletal:  No significant midline spine tenderness, no crepitus or step-offs. Right little finger distal deformity with associated swelling and erythema. Strength and sensation intact. 2+ radial pulses equal bilaterally. Mild tenderness to right wrist with full range of motion. No significant swelling or lesion.  Neurological: She is alert. No cranial nerve deficit. She exhibits normal muscle tone. Coordination normal.  Speech is clear and goal oriented Moves extremities without ataxia  Strength 5/5 in upper and lower extremities. Sensation intact. No pronator drift. Normal gait.   Skin: Skin is warm and dry. She is not diaphoretic.  Nursing note and vitals reviewed.   ED Course  Reduction of dislocation Date/Time: 06/04/2015 4:00 PM Performed by: Keaunna Skipper Authorized by: Al Corpus Consent: Verbal consent obtained. Risks and benefits: risks, benefits and alternatives were discussed Consent given by: patient Patient understanding: patient states understanding of  the procedure being performed Patient consent: the patient's understanding of the procedure matches consent given Patient identity confirmed: verbally with patient Local anesthesia used: no Patient sedated: no Patient tolerance: Patient  tolerated the procedure well with no immediate complications   (including critical care time) Labs Review Labs Reviewed - No data to display  Imaging Review Ct Head Wo Contrast  06/04/2015   CLINICAL DATA:  Status post fall, no loss of consciousness  EXAM: CT HEAD WITHOUT CONTRAST  CT CERVICAL SPINE WITHOUT CONTRAST  TECHNIQUE: Multidetector CT imaging of the head and cervical spine was performed following the standard protocol without intravenous contrast. Multiplanar CT image reconstructions of the cervical spine were also generated.  COMPARISON:  None.  FINDINGS: CT HEAD FINDINGS  There is no evidence of mass effect, midline shift, or extra-axial fluid collections. There is no evidence of a space-occupying lesion or intracranial hemorrhage. There is no evidence of a cortical-based area of acute infarction.  The ventricles and sulci are appropriate for the patient's age. The basal cisterns are patent.  Visualized portions of the orbits are unremarkable. The visualized portions of the paranasal sinuses and mastoid air cells are unremarkable.  The osseous structures are unremarkable.  CT CERVICAL SPINE FINDINGS  The alignment is anatomic. The vertebral body heights are maintained. There is loss of the normal cervical lordosis with mild reversal which may be secondary to the presence of a cervical collar versus spasm. There is no acute fracture. There is no static listhesis. The prevertebral soft tissues are normal. The intraspinal soft tissues are not fully imaged on this examination due to poor soft tissue contrast, but there is no gross soft tissue abnormality.  There is degenerative disc disease with disc height loss at C5-6. There is dense ossification of the posterior longitudinal ligament at C2-3 mildly narrowing the central canal. Central disc protrusion at C3-4. Small central disc protrusion at C4-5. Mild disc osteophyte complex at C5-6 eccentric towards the left with bilateral uncovertebral  degenerative changes resulting in bilateral foraminal narrowing. Moderate left facet arthropathy at C7-T1 .  The visualized portions of the lung apices demonstrate no focal abnormality.  IMPRESSION: 1. No acute intracranial pathology. 2. No acute osseous injury of the cervical spine. 3. Cervical spine spondylosis as described above.   Electronically Signed   By: Kathreen Devoid   On: 06/04/2015 15:08   Ct Cervical Spine Wo Contrast  06/04/2015   CLINICAL DATA:  Status post fall, no loss of consciousness  EXAM: CT HEAD WITHOUT CONTRAST  CT CERVICAL SPINE WITHOUT CONTRAST  TECHNIQUE: Multidetector CT imaging of the head and cervical spine was performed following the standard protocol without intravenous contrast. Multiplanar CT image reconstructions of the cervical spine were also generated.  COMPARISON:  None.  FINDINGS: CT HEAD FINDINGS  There is no evidence of mass effect, midline shift, or extra-axial fluid collections. There is no evidence of a space-occupying lesion or intracranial hemorrhage. There is no evidence of a cortical-based area of acute infarction.  The ventricles and sulci are appropriate for the patient's age. The basal cisterns are patent.  Visualized portions of the orbits are unremarkable. The visualized portions of the paranasal sinuses and mastoid air cells are unremarkable.  The osseous structures are unremarkable.  CT CERVICAL SPINE FINDINGS  The alignment is anatomic. The vertebral body heights are maintained. There is loss of the normal cervical lordosis with mild reversal which may be secondary to the presence of a cervical collar versus spasm. There is no  acute fracture. There is no static listhesis. The prevertebral soft tissues are normal. The intraspinal soft tissues are not fully imaged on this examination due to poor soft tissue contrast, but there is no gross soft tissue abnormality.  There is degenerative disc disease with disc height loss at C5-6. There is dense ossification of  the posterior longitudinal ligament at C2-3 mildly narrowing the central canal. Central disc protrusion at C3-4. Small central disc protrusion at C4-5. Mild disc osteophyte complex at C5-6 eccentric towards the left with bilateral uncovertebral degenerative changes resulting in bilateral foraminal narrowing. Moderate left facet arthropathy at C7-T1 .  The visualized portions of the lung apices demonstrate no focal abnormality.  IMPRESSION: 1. No acute intracranial pathology. 2. No acute osseous injury of the cervical spine. 3. Cervical spine spondylosis as described above.   Electronically Signed   By: Kathreen Devoid   On: 06/04/2015 15:08   Dg Hand Complete Right  06/04/2015   CLINICAL DATA:  [place] and injured right hand.  EXAM: RIGHT HAND - COMPLETE 3+ VIEW  COMPARISON:  None.  FINDINGS: There is a palmar dislocation of the distal phalanx of the fifth finger. Suspect an associated dorsal plate avulsion fracture. There are mild degenerative changes involving the hand but no other acute bony abnormality.  IMPRESSION: Palmar dislocation at the DIP joint of the fifth digit with probable associated avulsion fracture.   Electronically Signed   By: Marijo Sanes M.D.   On: 06/04/2015 14:53   Dg Finger Little Right  06/04/2015   CLINICAL DATA:  Postreduction of little finger dislocation. Subsequent encounter.  EXAM: RIGHT LITTLE FINGER 2+V  COMPARISON:  Earlier same date.  FINDINGS: Dislocation at the DIP joint has been reduced. There is a small avulsion fracture involving the ulnar base of the distal phalanx. The head of the middle phalanx appears intact. Mild soft tissue swelling noted.  IMPRESSION: Successful reduction of DIP dislocation with small avulsion fracture of the base of the distal phalanx.   Electronically Signed   By: Richardean Sale M.D.   On: 06/04/2015 15:48     EKG Interpretation None        MDM   Final diagnoses:  Fall  Finger fracture, right, closed, initial encounter  Dislocation of  right little finger, initial encounter  Traumatic hematoma of head, initial encounter   Patient presenting with mechanical fall with head injury and little finger injury. VSS. Patient say she has minimal pain. Neurological exam without deficits. Patient with deformity in her right distal little finger on right. Neurovascular intact. X-ray with evidence of distal phalangeal dislocation. Reduction performed without anesthetic. Patient tolerated procedure without immediate complications. Repeat x-ray with successful reduction. Care placed in a splint. Patient to follow-up with hand surgery. Patient nontoxic well-appearing and stable for discharge. Follow-up with PCP.  Discussed return precautions with patient. Discussed all results and patient verbalizes understanding and agrees with plan.  This is a shared patient. This patient was discussed with the physician who saw and evaluated the patient and agrees with the plan.   Al Corpus, PA-C 06/04/15 [place], MD 06/04/15 1723

## 2015-06-04 NOTE — Progress Notes (Signed)
Orthopedic Tech Progress Note Patient Details:  Katie Garcia 06/21/1941 [id number]  Ortho Devices Type of Ortho Device: Finger splint Ortho Device/Splint Location: RUE Ortho Device/Splint Interventions: Application   Asia R Thompson 06/04/2015, 4:05 PM

## 2016-02-09 DIAGNOSIS — D631 Anemia in chronic kidney disease: Secondary | ICD-10-CM | POA: Diagnosis not present

## 2016-02-09 DIAGNOSIS — I129 Hypertensive chronic kidney disease with stage 1 through stage 4 chronic kidney disease, or unspecified chronic kidney disease: Secondary | ICD-10-CM | POA: Diagnosis not present

## 2016-02-09 DIAGNOSIS — M109 Gout, unspecified: Secondary | ICD-10-CM | POA: Diagnosis not present

## 2016-02-09 DIAGNOSIS — E1129 Type 2 diabetes mellitus with other diabetic kidney complication: Secondary | ICD-10-CM | POA: Diagnosis not present

## 2016-02-09 DIAGNOSIS — N2581 Secondary hyperparathyroidism of renal origin: Secondary | ICD-10-CM | POA: Diagnosis not present

## 2016-02-09 DIAGNOSIS — N189 Chronic kidney disease, unspecified: Secondary | ICD-10-CM | POA: Diagnosis not present

## 2016-03-27 DIAGNOSIS — E7800 Pure hypercholesterolemia, unspecified: Secondary | ICD-10-CM | POA: Diagnosis not present

## 2016-03-27 DIAGNOSIS — M150 Primary generalized (osteo)arthritis: Secondary | ICD-10-CM | POA: Diagnosis not present

## 2016-03-27 DIAGNOSIS — L03115 Cellulitis of right lower limb: Secondary | ICD-10-CM | POA: Diagnosis not present

## 2016-03-27 DIAGNOSIS — I10 Essential (primary) hypertension: Secondary | ICD-10-CM | POA: Diagnosis not present

## 2016-04-30 ENCOUNTER — Emergency Department (HOSPITAL_COMMUNITY)
Admission: EM | Admit: 2016-04-30 | Discharge: 2016-04-30 | Disposition: A | Discharge status: Home / Self Care | Payer: Medicare Other | Attending: Emergency Medicine | Admitting: Emergency Medicine

## 2016-04-30 ENCOUNTER — Encounter (HOSPITAL_COMMUNITY): Payer: Self-pay | Admitting: *Deleted

## 2016-04-30 DIAGNOSIS — J3489 Other specified disorders of nose and nasal sinuses: Secondary | ICD-10-CM | POA: Diagnosis not present

## 2016-04-30 DIAGNOSIS — H9209 Otalgia, unspecified ear: Secondary | ICD-10-CM | POA: Insufficient documentation

## 2016-04-30 DIAGNOSIS — Z79899 Other long term (current) drug therapy: Secondary | ICD-10-CM | POA: Insufficient documentation

## 2016-04-30 DIAGNOSIS — E119 Type 2 diabetes mellitus without complications: Secondary | ICD-10-CM | POA: Diagnosis not present

## 2016-04-30 DIAGNOSIS — H578 Other specified disorders of eye and adnexa: Secondary | ICD-10-CM | POA: Diagnosis present

## 2016-04-30 DIAGNOSIS — Z880 Allergy status to penicillin: Secondary | ICD-10-CM | POA: Diagnosis not present

## 2016-04-30 DIAGNOSIS — H1013 Acute atopic conjunctivitis, bilateral: Secondary | ICD-10-CM | POA: Diagnosis not present

## 2016-04-30 DIAGNOSIS — Z794 Long term (current) use of insulin: Secondary | ICD-10-CM | POA: Insufficient documentation

## 2016-04-30 DIAGNOSIS — I10 Essential (primary) hypertension: Secondary | ICD-10-CM | POA: Diagnosis not present

## 2016-04-30 MED ORDER — OXYMETAZOLINE HCL 0.05 % NA SOLN: 1.0000 | Freq: Two times a day (BID) | NASAL | Status: DC

## 2016-04-30 MED ORDER — NAPHAZOLINE-PHENIRAMINE 0.025-0.3 % OP SOLN
1.0000 [drp] | OPHTHALMIC | Status: DC | PRN
Start: 2016-04-30 — End: 2016-06-26

## 2016-04-30 MED ORDER — TETRACAINE HCL 0.5 % OP SOLN
1.0000 [drp] | Freq: Once | OPHTHALMIC | Status: AC
  Administered 2016-04-30: 1 [drp] via OPHTHALMIC
  Filled 2016-04-30: qty 2

## 2016-04-30 MED ORDER — FLUORESCEIN SODIUM 1 MG OP STRP
1.0000 | ORAL_STRIP | Freq: Once | OPHTHALMIC | Status: AC
  Administered 2016-04-30: 1 via OPHTHALMIC
  Filled 2016-04-30: qty 1

## 2016-04-30 NOTE — ED Notes (Signed)
Declined W/C at D/C and was escorted to lobby by RN. 

## 2016-04-30 NOTE — ED Provider Notes (Signed)
CSN: [id number]     Arrival date & time 04/30/16  [id number] History  By signing my name below, I, Eustaquio Maize, attest that this documentation has been prepared under the direction and in the presence of Harlene Ramus, PA-C. Electronically Signed: Eustaquio Maize, ED Scribe. 04/30/2016. 10:20 AM.   Chief Complaint  Patient presents with  . Eye Problem   The history is provided by the patient. No language interpreter was used.     HPI Comments: Tiffany Kuzmenko Bartha is a 75 y.o. female with PMHx DM and HTN, who presents to the Emergency Department complaining of gradual onset, constant, itchy watery red eyes x 2-3 weeks, gradually worsening. Pt reports that her eyes are crusted shut in the mornings. She also notes a gritty sensation to her eyes. No recent injury to the eyes. Pt does not wear contacts. She also complains of a dry cough, nasal congestion, sore throat, bilateral ear pain, wheezing, and sinus pressure. Pt has hx of seasonal allergies but states it has never been this severe. She has been eating and drinking normally. Denies eye pain, visual changes, photophobia, rhinorrhea, shortness of breath, chest pain, nausea, vomiting, diarrhea, fever, or any other associated symptoms. Pt states that her glucose levels run in the 200-300 range but has been this way since being on antibiotics a couple of months ago.   Past Medical History  Diagnosis Date  . Diabetes mellitus without complication ([place])   . Hypertension    History reviewed. No pertinent past surgical history. History reviewed. No pertinent family history. Social History  Substance Use Topics  . Smoking status: Never Smoker   . Smokeless tobacco: Never Used  . Alcohol Use: No   OB History    No data available     Review of Systems  Constitutional: Negative for fever.  HENT: Positive for congestion, ear pain, sinus pressure and sore throat. Negative for rhinorrhea.   Eyes: Positive for discharge, redness and itching. Negative for  photophobia, pain and visual disturbance.  Respiratory: Positive for cough and wheezing. Negative for shortness of breath.   Cardiovascular: Negative for chest pain.  Gastrointestinal: Negative for nausea, vomiting and diarrhea.   Allergies  Penicillins  Home Medications   Prior to Admission medications   Medication Sig Start Date End Date Taking? Authorizing Provider  allopurinol (ZYLOPRIM) 100 MG tablet Take 100 mg by mouth daily.    Historical Provider, MD  amLODipine (NORVASC) 10 MG tablet Take 10 mg by mouth daily.    Historical Provider, MD  ezetimibe (ZETIA) 10 MG tablet Take 10 mg by mouth daily.    Historical Provider, MD  insulin detemir (LEVEMIR) 100 UNIT/ML injection Inject 0.2 mLs (20 Units total) into the skin at bedtime. 03/06/14   Shanker Kristeen Mans, MD  naphazoline-pheniramine (NAPHCON-A) 0.025-0.3 % ophthalmic solution Place 1 drop into both eyes every 4 (four) hours as needed for irritation or allergies. 04/30/16   Nona Dell, PA-C  oxymetazoline (AFRIN NASAL SPRAY) 0.05 % nasal spray Place 1 spray into both nostrils 2 (two) times daily. Apply 1 spray to both nostrils twice daily for up to 3 days. Do not use the medication for more than 3 days. 04/30/16   Chesley Noon Nadeau, PA-C   BP 156/74 mmHg  Pulse 78  Temp(Src) 98.4 F (36.9 C) (Oral)  Resp 18  Ht 5' (1.524 m)  Wt 96.616 kg  BMI 41.60 kg/m2  SpO2 99%   Physical Exam  Constitutional: She is oriented to person, place,  and time. She appears well-developed and well-nourished.  HENT:  Head: Normocephalic and atraumatic.  Right Ear: Tympanic membrane normal.  Left Ear: Tympanic membrane normal.  Nose: Rhinorrhea present. Right sinus exhibits no maxillary sinus tenderness and no frontal sinus tenderness. Left sinus exhibits no maxillary sinus tenderness and no frontal sinus tenderness.  Mouth/Throat: Uvula is midline, oropharynx is clear and moist and mucous membranes are normal. No oropharyngeal  exudate, posterior oropharyngeal edema, posterior oropharyngeal erythema or tonsillar abscesses.  Eyes: EOM are normal. Pupils are equal, round, and reactive to light. Right eye exhibits discharge (watery). Right eye exhibits no chemosis and no hordeolum. No foreign body present in the right eye. Left eye exhibits discharge (watery). Left eye exhibits no chemosis and no hordeolum. No foreign body present in the left eye. Right conjunctiva is injected. Left conjunctiva is injected. No scleral icterus.  Slit lamp exam:      The right eye shows no corneal abrasion, no corneal flare, no corneal ulcer, no foreign body, no hyphema and no fluorescein uptake.       The left eye shows no corneal abrasion, no corneal flare, no corneal ulcer, no foreign body, no hyphema and no fluorescein uptake.  Neck: Normal range of motion. Neck supple.  Cardiovascular: Normal rate, regular rhythm, normal heart sounds and intact distal pulses.   Pulmonary/Chest: Effort normal and breath sounds normal. No respiratory distress. She has no wheezes. She has no rales. She exhibits no tenderness.  Abdominal: Soft. Bowel sounds are normal. There is no tenderness.  Musculoskeletal: She exhibits no edema.  Lymphadenopathy:    She has no cervical adenopathy.  Neurological: She is alert and oriented to person, place, and time.  Skin: Skin is warm and dry.  Nursing note and vitals reviewed.   ED Course  Procedures (including critical care time)  DIAGNOSTIC STUDIES: Oxygen Saturation is 99% on RA, normal by my interpretation.    COORDINATION OF CARE: 10:12 AM-Discussed treatment plan which includes fluorescein strip test with pt at bedside and pt agreed to plan.   Labs Review Labs Reviewed - No data to display  Imaging Review No results found. I have personally reviewed and evaluated these images and lab results as part of my medical decision-making.   EKG Interpretation None      MDM   Final diagnoses:  Allergic  conjunctivitis, bilateral  Rhinorrhea   Patient presentation consistent with allergic conjunctivitis.  No purulent discharge, corneal abrasions, entrapment, consensual photophobia, or dendritic staining with fluorescein study.  Presentation non-concerning for iritis, bacterial conjunctivitis, corneal abrasions, or HSV.  No antibiotics are indicated and patient will be prescribed naphazoline for itching and afrin for nasal congestion.  Patient advised to followup with ophthalmologist if symptoms persist or worsen in any way including vision change or purulent discharge.  Patient verbalizes understanding and is agreeable with discharge.   I personally performed the services described in this documentation, which was scribed in my presence. The recorded information has been reviewed and is accurate.      Chesley Noon Shepherd, [place] 04/30/16 1157  Sherwood Gambler, MD 05/03/16 [phone]

## 2016-04-30 NOTE — Discharge Instructions (Signed)
Take your medications as prescribed. You may also use over-the-counter artificial tears as needed. Follow-up with your primary care provider in the next 3-4 days as needed if your symptoms have not improved. Return to the emergency department if symptoms worsen or new onset of fever, facial swelling, change in vision, purulent eye drainage, light sensitivity, difficulty breathing, reactive cough, coughing up blood, wheezing, chest pain.

## 2016-04-30 NOTE — ED Notes (Signed)
Pt reports bilateral eyes have been itching and clear drainage for 3 weeks. Swelling to eyes started 2 days ago.

## 2016-05-10 DIAGNOSIS — N189 Chronic kidney disease, unspecified: Secondary | ICD-10-CM | POA: Diagnosis not present

## 2016-05-10 DIAGNOSIS — I129 Hypertensive chronic kidney disease with stage 1 through stage 4 chronic kidney disease, or unspecified chronic kidney disease: Secondary | ICD-10-CM | POA: Diagnosis not present

## 2016-05-10 DIAGNOSIS — D631 Anemia in chronic kidney disease: Secondary | ICD-10-CM | POA: Diagnosis not present

## 2016-05-10 DIAGNOSIS — N2581 Secondary hyperparathyroidism of renal origin: Secondary | ICD-10-CM | POA: Diagnosis not present

## 2016-05-10 DIAGNOSIS — M109 Gout, unspecified: Secondary | ICD-10-CM | POA: Diagnosis not present

## 2016-05-10 DIAGNOSIS — E119 Type 2 diabetes mellitus without complications: Secondary | ICD-10-CM | POA: Diagnosis not present

## 2016-05-10 DIAGNOSIS — E1129 Type 2 diabetes mellitus with other diabetic kidney complication: Secondary | ICD-10-CM | POA: Diagnosis not present

## 2016-06-26 ENCOUNTER — Emergency Department (HOSPITAL_COMMUNITY): Payer: Medicare Other

## 2016-06-26 ENCOUNTER — Emergency Department (HOSPITAL_COMMUNITY)
Admission: EM | Admit: 2016-06-26 | Discharge: 2016-06-26 | Disposition: A | Discharge status: Home / Self Care | Payer: Medicare Other | Attending: Emergency Medicine | Admitting: Emergency Medicine

## 2016-06-26 ENCOUNTER — Encounter (HOSPITAL_COMMUNITY): Payer: Self-pay

## 2016-06-26 DIAGNOSIS — R0789 Other chest pain: Secondary | ICD-10-CM | POA: Diagnosis not present

## 2016-06-26 DIAGNOSIS — I10 Essential (primary) hypertension: Secondary | ICD-10-CM | POA: Insufficient documentation

## 2016-06-26 DIAGNOSIS — R42 Dizziness and giddiness: Secondary | ICD-10-CM | POA: Diagnosis not present

## 2016-06-26 DIAGNOSIS — Z794 Long term (current) use of insulin: Secondary | ICD-10-CM | POA: Diagnosis not present

## 2016-06-26 DIAGNOSIS — K219 Gastro-esophageal reflux disease without esophagitis: Secondary | ICD-10-CM | POA: Insufficient documentation

## 2016-06-26 DIAGNOSIS — R079 Chest pain, unspecified: Secondary | ICD-10-CM | POA: Insufficient documentation

## 2016-06-26 DIAGNOSIS — E119 Type 2 diabetes mellitus without complications: Secondary | ICD-10-CM | POA: Diagnosis not present

## 2016-06-26 DIAGNOSIS — N289 Disorder of kidney and ureter, unspecified: Secondary | ICD-10-CM | POA: Diagnosis not present

## 2016-06-26 DIAGNOSIS — R51 Headache: Secondary | ICD-10-CM | POA: Diagnosis not present

## 2016-06-26 LAB — CBC
HCT: 34.1 % — ABNORMAL LOW (ref 36.0–46.0)
HEMOGLOBIN: 10.5 g/dL — AB (ref 12.0–15.0)
MCH: 26.2 pg (ref 26.0–34.0)
MCHC: 30.8 g/dL (ref 30.0–36.0)
MCV: 85 fL (ref 78.0–100.0)
Platelets: 254 10*3/uL (ref 150–400)
RBC: 4.01 MIL/uL (ref 3.87–5.11)
RDW: 14.9 % (ref 11.5–15.5)
WBC: 7.9 10*3/uL (ref 4.0–10.5)

## 2016-06-26 LAB — COMPREHENSIVE METABOLIC PANEL
ALT: 11 U/L — ABNORMAL LOW (ref 14–54)
AST: 15 U/L (ref 15–41)
Albumin: 3.3 g/dL — ABNORMAL LOW (ref 3.5–5.0)
Alkaline Phosphatase: 70 U/L (ref 38–126)
Anion gap: 10 (ref 5–15)
BUN: 68 mg/dL — AB (ref 6–20)
CHLORIDE: 105 mmol/L (ref 101–111)
CO2: 21 mmol/L — ABNORMAL LOW (ref 22–32)
Calcium: 9.6 mg/dL (ref 8.9–10.3)
Creatinine, Ser: 3.9 mg/dL — ABNORMAL HIGH (ref 0.44–1.00)
GFR calc Af Amer: 12 mL/min — ABNORMAL LOW (ref >60)
GFR, EST NON AFRICAN AMERICAN: 11 mL/min — AB (ref >60)
Glucose, Bld: 267 mg/dL — ABNORMAL HIGH (ref 65–99)
POTASSIUM: 4.3 mmol/L (ref 3.5–5.1)
Sodium: 136 mmol/L (ref 135–145)
Total Bilirubin: 0.6 mg/dL (ref 0.3–1.2)
Total Protein: 7.4 g/dL (ref 6.5–8.1)

## 2016-06-26 LAB — URINALYSIS, ROUTINE W REFLEX MICROSCOPIC
Bilirubin Urine: NEGATIVE
GLUCOSE, UA: 250 mg/dL — AB
Ketones, ur: NEGATIVE mg/dL
Nitrite: NEGATIVE
Specific Gravity, Urine: 1.018 (ref 1.005–1.030)
pH: 5.5 (ref 5.0–8.0)

## 2016-06-26 LAB — URINE MICROSCOPIC-ADD ON

## 2016-06-26 LAB — I-STAT TROPONIN, ED: TROPONIN I, POC: 0.01 ng/mL (ref 0.00–0.08)

## 2016-06-26 LAB — LIPASE, BLOOD: LIPASE: 28 U/L (ref 11–51)

## 2016-06-26 MED ORDER — ONDANSETRON 4 MG PO TBDP
ORAL_TABLET | ORAL | Status: AC
  Filled 2016-06-26: qty 2

## 2016-06-26 MED ORDER — OMEPRAZOLE 20 MG PO CPDR: 20.0000 mg | DELAYED_RELEASE_CAPSULE | Freq: Every day | ORAL | Status: DC

## 2016-06-26 MED ORDER — MECLIZINE HCL 12.5 MG PO TABS: 12.5000 mg | ORAL_TABLET | Freq: Three times a day (TID) | ORAL | Status: DC | PRN

## 2016-06-26 MED ORDER — DIAZEPAM 2 MG PO TABS
2.0000 mg | ORAL_TABLET | Freq: Once | ORAL | Status: AC
  Administered 2016-06-26: 2 mg via ORAL
  Filled 2016-06-26: qty 1

## 2016-06-26 MED ORDER — ONDANSETRON 4 MG PO TBDP
8.0000 mg | ORAL_TABLET | Freq: Once | ORAL | Status: AC
  Administered 2016-06-26: 8 mg via ORAL

## 2016-06-26 NOTE — ED Notes (Signed)
Patient transported to X-ray 

## 2016-06-26 NOTE — ED Provider Notes (Signed)
CSN: [id number]     Arrival date & time 06/26/16  0919 History   First MD Initiated Contact with Patient 06/26/16 [phone]     Chief Complaint  Patient presents with  . Dizziness  . indigestion      (Consider location/radiation/quality/duration/timing/severity/associated sxs/prior Treatment) HPI Katie Garcia is a 75 y.o. female with hx of HTN and DM, presents to ED with multiple complaints. Pt reports posterior headache, nausea, dizziness, throat and chest burning, pain in bilateral hips. ptReports that her headache and dizziness started gradually approximately 2 days ago. She states the headache is mild, states "mostly annoying." She reports sensation of room spinning when she moves her head or when she walks. States symptoms are intermittent, and does not happen each time she walks. She states she has to hold onto things. She reports history of right inner ear problem "years and years ago" with similar symptoms. Patient states she is also having intermittent sensation of burning in her throat that radiates into her chest. She states that feels like indigestion. She has tried Tums which has helped. Patient also states that eating makes this pain better. She denies any vomiting. She denies any problems with her bowels or bladder. She denies any urinary symptoms. She has not tried any other medications to help with this.  Past Medical History  Diagnosis Date  . Diabetes mellitus without complication ([place])   . Hypertension    History reviewed. No pertinent past surgical history. No family history on file. Social History  Substance Use Topics  . Smoking status: Never Smoker   . Smokeless tobacco: Never Used  . Alcohol Use: No   OB History    No data available     Review of Systems  Constitutional: Negative for fever and chills.  HENT: Positive for ear pain. Negative for congestion, sore throat, tinnitus and trouble swallowing.   Respiratory: Negative for cough, chest tightness and  shortness of breath.   Cardiovascular: Positive for chest pain. Negative for palpitations and leg swelling.  Gastrointestinal: Negative for nausea, vomiting, abdominal pain and diarrhea.  Genitourinary: Negative for dysuria, flank pain and pelvic pain.  Musculoskeletal: Negative for myalgias, arthralgias, neck pain and neck stiffness.  Skin: Negative for rash.  Neurological: Positive for dizziness, light-headedness and headaches. Negative for weakness.  All other systems reviewed and are negative.     Allergies  Penicillins  Home Medications   Prior to Admission medications   Medication Sig Start Date End Date Taking? Authorizing Provider  allopurinol (ZYLOPRIM) 100 MG tablet Take 100 mg by mouth daily.    Historical Provider, MD  amLODipine (NORVASC) 10 MG tablet Take 10 mg by mouth daily.    Historical Provider, MD  ezetimibe (ZETIA) 10 MG tablet Take 10 mg by mouth daily.    Historical Provider, MD  insulin detemir (LEVEMIR) 100 UNIT/ML injection Inject 0.2 mLs (20 Units total) into the skin at bedtime. 03/06/14   Shanker Kristeen Mans, MD  naphazoline-pheniramine (NAPHCON-A) 0.025-0.3 % ophthalmic solution Place 1 drop into both eyes every 4 (four) hours as needed for irritation or allergies. 04/30/16   Nona Dell, PA-C  oxymetazoline (AFRIN NASAL SPRAY) 0.05 % nasal spray Place 1 spray into both nostrils 2 (two) times daily. Apply 1 spray to both nostrils twice daily for up to 3 days. Do not use the medication for more than 3 days. 04/30/16   Chesley Noon Nadeau, PA-C   BP 152/67 mmHg  Pulse 79  Temp(Src) 98.6 F (37 C)  Resp 18  Ht 5' (1.524 m)  Wt 92.987 kg  BMI 40.04 kg/m2  SpO2 100% Physical Exam  Constitutional: She is oriented to person, place, and time. She appears well-developed and well-nourished. No distress.  HENT:  Head: Normocephalic.  Eyes: Conjunctivae and EOM are normal. Pupils are equal, round, and reactive to light.  No nystagmus  Neck: Normal  range of motion. Neck supple.  Cardiovascular: Normal rate, regular rhythm and normal heart sounds.   Pulmonary/Chest: Effort normal and breath sounds normal. No respiratory distress. She has no wheezes. She has no rales.  Abdominal: Soft. Bowel sounds are normal. She exhibits no distension. There is no tenderness. There is no rebound.  Musculoskeletal: She exhibits no edema.  Neurological: She is alert and oriented to person, place, and time.  5/5 and equal upper and lower extremity strength bilaterally. Equal grip strength bilaterally. Normal finger to nose and heel to shin. No pronator drift. Gait is normal  Skin: Skin is warm and dry.  Psychiatric: She has a normal mood and affect. Her behavior is normal.  Nursing note and vitals reviewed.   ED Course  Procedures (including critical care time) Labs Review Labs Reviewed  CBC - Abnormal; Notable for the following:    Hemoglobin 10.5 (*)    HCT 34.1 (*)    All other components within normal limits  COMPREHENSIVE METABOLIC PANEL - Abnormal; Notable for the following:    CO2 21 (*)    Glucose, Bld 267 (*)    BUN 68 (*)    Creatinine, Ser 3.90 (*)    Albumin 3.3 (*)    ALT 11 (*)    GFR calc non Af Amer 11 (*)    GFR calc Af Amer 12 (*)    All other components within normal limits  URINALYSIS, ROUTINE W REFLEX MICROSCOPIC (NOT AT [facility]) - Abnormal; Notable for the following:    APPearance CLOUDY (*)    Glucose, UA 250 (*)    Hgb urine dipstick SMALL (*)    Protein, ur >300 (*)    Leukocytes, UA SMALL (*)    All other components within normal limits  URINE MICROSCOPIC-ADD ON - Abnormal; Notable for the following:    Squamous Epithelial / LPF 0-5 (*)    Bacteria, UA RARE (*)    All other components within normal limits  LIPASE, BLOOD  I-STAT TROPOININ, ED    Imaging Review Dg Chest 2 View  06/26/2016  CLINICAL DATA:  Dizziness with posterior headache and indigestion for 2 days, diabetes mellitus, hypertension EXAM: CHEST  2  VIEW COMPARISON:  None FINDINGS: Normal heart size, mediastinal contours, and pulmonary vascularity. Lungs clear. No definite infiltrate, pleural effusion or pneumothorax. Bones demineralized. IMPRESSION: No acute abnormalities. Electronically Signed   By: Lavonia Dana M.D.   On: 06/26/2016 10:05   Mr Brain Wo Contrast  06/26/2016  CLINICAL DATA:  Dizziness and posterior headache for 2 days. EXAM: MRI HEAD WITHOUT CONTRAST TECHNIQUE: Multiplanar, multiecho pulse sequences of the brain and surrounding structures were obtained without intravenous contrast. COMPARISON:  06/04/2015 head CT FINDINGS: Calvarium and upper cervical spine: No focal marrow signal abnormality. Orbits: Negative. Sinuses and Mastoids: Clear. Brain: No acute or remote infarct, hemorrhage, hydrocephalus, or mass lesion. No evidence of large vessel occlusion. Normal cerebral volume and white matter appearance for age. Other: Incidental 10 mm nodule in the superficial right parotid with fluid fluid level. This area was likely not encompassed on comparison CT. Fluid level consistent with cystic  nature. No visible solid mass. No cluster of cysts or dilated vessels typical of venolymphatic malformation. IMPRESSION: 1. Negative intracranial imaging.  No explanation for symptoms. 2. 10 mm right parotid cyst with fluid level. Electronically Signed   By: Monte Fantasia M.D.   On: 06/26/2016 14:30   I have personally reviewed and evaluated these images and lab results as part of my medical decision-making.   EKG Interpretation   Date/Time:  Monday June 26 2016 09:24:58 EDT Ventricular Rate:  79 PR Interval:  162 QRS Duration: 76 QT Interval:  372 QTC Calculation: 426 R Axis:   -4 Text Interpretation:  Sinus rhythm with Premature atrial complexes  Moderate voltage criteria for LVH, may be normal variant Borderline ECG No  old tracing to compare Confirmed by [facility]  MD, ELLIOTT [phone]) on 06/26/2016  9:49:55 AM      MDM   Final  diagnoses:  Chest pain, unspecified chest pain type  Vertigo  Gastroesophageal reflux disease, esophagitis presence not specified  Renal insufficiency   Patient in emergency department with vertigo like symptoms also some burning sensation of throat and chest. Also complaining of mild headache. Will check labs, orthostatic vitals, EKG.  Patient is not orthostatic. Lab work did show worsening renal function with creatinine of 2.9, patient's last creatinine was 2 years ago and was 2. Although creatinine has doubled now, we do not have any recent blood work on this patient so not sure if this is acute. Glucose is 267, will need to follow-up with primary care doctor regarding both of these. Encouraged to drink plenty of fluids at home. No major electrolyte abnormalities otherwise. I discussed patient with Dr. Peyton Bottoms who has seen her as well, will get MRI of the brain.  Negative MRI of the brain. Patient was given 2 mg by mouth Valium, and she felt better and stated that it helped her dizziness. Most likely peripheral vertigo. Will start on Prilosec and meclizine. Will have her follow-up with primary care doctor to recheck her renal function. Instructed to drink plenty of fluids. Return precautions discussed  Filed Vitals:   06/26/16 1425 06/26/16 1430 06/26/16 1445 06/26/16 1500  BP: 163/69 166/75 154/66 160/67  Pulse: 87 76 79 72  Temp:      Resp: 15 12 20 13   Height:      Weight:      SpO2: 100% 99% 98% 95%     Jeannett Senior, PA-C 06/26/16 [place], MD 06/26/16 1721

## 2016-06-26 NOTE — Discharge Instructions (Signed)
Take prilosec for heartburn daily. Take antivert for dizziness as needed.   Your kidney function is elevated today, please follow up with your doctor to have it rechecked as soon possible.   Return if worsening.    Benign Positional Vertigo Vertigo is the feeling that you or your surroundings are moving when they are not. Benign positional vertigo is the most common form of vertigo. The cause of this condition is not serious (is benign). This condition is triggered by certain movements and positions (is positional). This condition can be dangerous if it occurs while you are doing something that could endanger you or others, such as driving.  CAUSES In many cases, the cause of this condition is not known. It may be caused by a disturbance in an area of the inner ear that helps your brain to sense movement and balance. This disturbance can be caused by a viral infection (labyrinthitis), head injury, or repetitive motion. RISK FACTORS This condition is more likely to develop in:  Women.  People who are 42 years of age or older. SYMPTOMS Symptoms of this condition usually happen when you move your head or your eyes in different directions. Symptoms may start suddenly, and they usually last for less than a minute. Symptoms may include:  Loss of balance and falling.  Feeling like you are spinning or moving.  Feeling like your surroundings are spinning or moving.  Nausea and vomiting.  Blurred vision.  Dizziness.  Involuntary eye movement (nystagmus). Symptoms can be mild and cause only slight annoyance, or they can be severe and interfere with daily life. Episodes of benign positional vertigo may return (recur) over time, and they may be triggered by certain movements. Symptoms may improve over time. DIAGNOSIS This condition is usually diagnosed by medical history and a physical exam of the head, neck, and ears. You may be referred to a health care provider who specializes in ear, nose,  and throat (ENT) problems (otolaryngologist) or a provider who specializes in disorders of the nervous system (neurologist). You may have additional testing, including:  MRI.  A CT scan.  Eye movement tests. Your health care provider may ask you to change positions quickly while he or she watches you for symptoms of benign positional vertigo, such as nystagmus. Eye movement may be tested with an electronystagmogram (ENG), caloric stimulation, the Dix-Hallpike test, or the roll test.  An electroencephalogram (EEG). This records electrical activity in your brain.  Hearing tests. TREATMENT Usually, your health care provider will treat this by moving your head in specific positions to adjust your inner ear back to normal. Surgery may be needed in severe cases, but this is rare. In some cases, benign positional vertigo may resolve on its own in 2-4 weeks. HOME CARE INSTRUCTIONS Safety  Move slowly.Avoid sudden body or head movements.  Avoid driving.  Avoid operating heavy machinery.  Avoid doing any tasks that would be dangerous to you or others if a vertigo episode would occur.  If you have trouble walking or keeping your balance, try using a cane for stability. If you feel dizzy or unstable, sit down right away.  Return to your normal activities as told by your health care provider. Ask your health care provider what activities are safe for you. General Instructions  Take over-the-counter and prescription medicines only as told by your health care provider.  Avoid certain positions or movements as told by your health care provider.  Drink enough fluid to keep your urine clear or pale  yellow.  Keep all follow-up visits as told by your health care provider. This is important. SEEK MEDICAL CARE IF:  You have a fever.  Your condition gets worse or you develop new symptoms.  Your family or friends notice any behavioral changes.  Your nausea or vomiting gets worse.  You have  numbness or a "pins and needles" sensation. SEEK IMMEDIATE MEDICAL CARE IF:  You have difficulty speaking or moving.  You are always dizzy.  You faint.  You develop severe headaches.  You have weakness in your legs or arms.  You have changes in your hearing or vision.  You develop a stiff neck.  You develop sensitivity to light.   This information is not intended to replace advice given to you by your health care provider. Make sure you discuss any questions you have with your health care provider.   Document Released: 09/18/2006 Document Revised: 09/01/2015 Document Reviewed: 04/05/2015 Elsevier Interactive Patient Education 2016 [place].   Gastroesophageal Reflux Disease, Adult Normally, food travels down the esophagus and stays in the stomach to be digested. However, when a person has gastroesophageal reflux disease (GERD), food and stomach acid move back up into the esophagus. When this happens, the esophagus becomes sore and inflamed. Over time, GERD can create small holes (ulcers) in the lining of the esophagus.  CAUSES This condition is caused by a problem with the muscle between the esophagus and the stomach (lower esophageal sphincter, or LES). Normally, the LES muscle closes after food passes through the esophagus to the stomach. When the LES is weakened or abnormal, it does not close properly, and that allows food and stomach acid to go back up into the esophagus. The LES can be weakened by certain dietary substances, medicines, and medical conditions, including:  Tobacco use.  Pregnancy.  Having a hiatal hernia.  Heavy alcohol use.  Certain foods and beverages, such as coffee, chocolate, onions, and peppermint. RISK FACTORS This condition is more likely to develop in:  People who have an increased body weight.  People who have connective tissue disorders.  People who use NSAID medicines. SYMPTOMS Symptoms of this condition  include:  Heartburn.  Difficult or painful swallowing.  The feeling of having a lump in the throat.  Abitter taste in the mouth.  Bad breath.  Having a large amount of saliva.  Having an upset or bloated stomach.  Belching.  Chest pain.  Shortness of breath or wheezing.  Ongoing (chronic) cough or a night-time cough.  Wearing away of tooth enamel.  Weight loss. Different conditions can cause chest pain. Make sure to see your health care provider if you experience chest pain. DIAGNOSIS Your health care provider will take a medical history and perform a physical exam. To determine if you have mild or severe GERD, your health care provider may also monitor how you respond to treatment. You may also have other tests, including:  An endoscopy toexamine your stomach and esophagus with a small camera.  A test thatmeasures the acidity level in your esophagus.  A test thatmeasures how much pressure is on your esophagus.  A barium swallow or modified barium swallow to show the shape, size, and functioning of your esophagus. TREATMENT The goal of treatment is to help relieve your symptoms and to prevent complications. Treatment for this condition may vary depending on how severe your symptoms are. Your health care provider may recommend:  Changes to your diet.  Medicine.  Surgery. HOME CARE INSTRUCTIONS Diet  Follow a  diet as recommended by your health care provider. This may involve avoiding foods and drinks such as:  Coffee and tea (with or without caffeine).  Drinks that containalcohol.  Energy drinks and sports drinks.  Carbonated drinks or sodas.  Chocolate and cocoa.  Peppermint and mint flavorings.  Garlic and onions.  Horseradish.  Spicy and acidic foods, including peppers, chili powder, curry powder, vinegar, hot sauces, and barbecue sauce.  Citrus fruit juices and citrus fruits, such as oranges, lemons, and limes.  Tomato-based foods, such as  red sauce, chili, salsa, and pizza with red sauce.  Fried and fatty foods, such as donuts, french fries, potato chips, and high-fat dressings.  High-fat meats, such as hot dogs and fatty cuts of red and white meats, such as rib eye steak, sausage, ham, and bacon.  High-fat dairy items, such as whole milk, butter, and cream cheese.  Eat small, frequent meals instead of large meals.  Avoid drinking large amounts of liquid with your meals.  Avoid eating meals during the 2-3 hours before bedtime.  Avoid lying down right after you eat.  Do not exercise right after you eat. General Instructions  Pay attention to any changes in your symptoms.  Take over-the-counter and prescription medicines only as told by your health care provider. Do not take aspirin, ibuprofen, or other NSAIDs unless your health care provider told you to do so.  Do not use any tobacco products, including cigarettes, chewing tobacco, and e-cigarettes. If you need help quitting, ask your health care provider.  Wear loose-fitting clothing. Do not wear anything tight around your waist that causes pressure on your abdomen.  Raise (elevate) the head of your bed 6 inches (15cm).  Try to reduce your stress, such as with yoga or meditation. If you need help reducing stress, ask your health care provider.  If you are overweight, reduce your weight to an amount that is healthy for you. Ask your health care provider for guidance about a safe weight loss goal.  Keep all follow-up visits as told by your health care provider. This is important. SEEK MEDICAL CARE IF:  You have new symptoms.  You have unexplained weight loss.  You have difficulty swallowing, or it hurts to swallow.  You have wheezing or a persistent cough.  Your symptoms do not improve with treatment.  You have a hoarse voice. SEEK IMMEDIATE MEDICAL CARE IF:  You have pain in your arms, neck, jaw, teeth, or back.  You feel sweaty, dizzy, or  light-headed.  You have chest pain or shortness of breath.  You vomit and your vomit looks like blood or coffee grounds.  You faint.  Your stool is bloody or black.  You cannot swallow, drink, or eat.   This information is not intended to replace advice given to you by your health care provider. Make sure you discuss any questions you have with your health care provider.   Document Released: 09/20/2005 Document Revised: 09/01/2015 Document Reviewed: 04/07/2015 Elsevier Interactive Patient Education [employer].

## 2016-06-26 NOTE — ED Notes (Signed)
Patient assisted to restroom in wheelchair.

## 2016-06-26 NOTE — ED Notes (Signed)
Pt stable, ambulatory, states understanding of discharge instructions 

## 2016-06-26 NOTE — ED Notes (Signed)
Patient complains of dizziness with posterior headache and indigestion in chest x 2 days. Also concerned with bilateral hip pain x 2 days. Alert and oriented.  NAD

## 2016-06-26 NOTE — ED Provider Notes (Signed)
  Face-to-face evaluation   History: She complains of dizziness, and bilateral intermittent headache, for 2 days. The dizziness is intermittent and comes and goes but is worse with movement of her head. She has a remote history of peripheral vertigo but none recently.  Physical exam: Alert, elderly female. Mild, intermittent right beating nystagmus, with rightward gaze. Positive test of skew, right eye. Negative head impulse testing.  Medical screening examination/treatment/procedure(s) were conducted as a shared visit with non-physician practitioner(s) and myself.  I personally evaluated the patient during the encounter  Daleen Bo, MD 06/26/16 [phone]

## 2016-06-29 DIAGNOSIS — M150 Primary generalized (osteo)arthritis: Secondary | ICD-10-CM | POA: Diagnosis not present

## 2016-06-29 DIAGNOSIS — I10 Essential (primary) hypertension: Secondary | ICD-10-CM | POA: Diagnosis not present

## 2016-06-29 DIAGNOSIS — E119 Type 2 diabetes mellitus without complications: Secondary | ICD-10-CM | POA: Diagnosis not present

## 2016-06-29 DIAGNOSIS — E7800 Pure hypercholesterolemia, unspecified: Secondary | ICD-10-CM | POA: Diagnosis not present

## 2016-07-26 DIAGNOSIS — H25813 Combined forms of age-related cataract, bilateral: Secondary | ICD-10-CM | POA: Diagnosis not present

## 2016-07-26 DIAGNOSIS — E113293 Type 2 diabetes mellitus with mild nonproliferative diabetic retinopathy without macular edema, bilateral: Secondary | ICD-10-CM | POA: Diagnosis not present

## 2016-07-26 DIAGNOSIS — H401131 Primary open-angle glaucoma, bilateral, mild stage: Secondary | ICD-10-CM | POA: Diagnosis not present

## 2016-07-27 DIAGNOSIS — M150 Primary generalized (osteo)arthritis: Secondary | ICD-10-CM | POA: Diagnosis not present

## 2016-07-27 DIAGNOSIS — I10 Essential (primary) hypertension: Secondary | ICD-10-CM | POA: Diagnosis not present

## 2016-07-27 DIAGNOSIS — E7800 Pure hypercholesterolemia, unspecified: Secondary | ICD-10-CM | POA: Diagnosis not present

## 2016-07-27 DIAGNOSIS — E119 Type 2 diabetes mellitus without complications: Secondary | ICD-10-CM | POA: Diagnosis not present

## 2016-07-31 DIAGNOSIS — M109 Gout, unspecified: Secondary | ICD-10-CM | POA: Diagnosis not present

## 2016-07-31 DIAGNOSIS — N2581 Secondary hyperparathyroidism of renal origin: Secondary | ICD-10-CM | POA: Diagnosis not present

## 2016-07-31 DIAGNOSIS — D631 Anemia in chronic kidney disease: Secondary | ICD-10-CM | POA: Diagnosis not present

## 2016-07-31 DIAGNOSIS — Z6841 Body Mass Index (BMI) 40.0 and over, adult: Secondary | ICD-10-CM | POA: Diagnosis not present

## 2016-07-31 DIAGNOSIS — N189 Chronic kidney disease, unspecified: Secondary | ICD-10-CM | POA: Diagnosis not present

## 2016-07-31 DIAGNOSIS — E1129 Type 2 diabetes mellitus with other diabetic kidney complication: Secondary | ICD-10-CM | POA: Diagnosis not present

## 2016-07-31 DIAGNOSIS — I129 Hypertensive chronic kidney disease with stage 1 through stage 4 chronic kidney disease, or unspecified chronic kidney disease: Secondary | ICD-10-CM | POA: Diagnosis not present

## 2016-08-03 ENCOUNTER — Other Ambulatory Visit: Payer: Self-pay | Admitting: Vascular Surgery

## 2016-08-03 DIAGNOSIS — Z01810 Encounter for preprocedural cardiovascular examination: Secondary | ICD-10-CM

## 2016-08-03 DIAGNOSIS — N185 Chronic kidney disease, stage 5: Secondary | ICD-10-CM

## 2016-08-18 ENCOUNTER — Other Ambulatory Visit (HOSPITAL_COMMUNITY): Payer: Medicare Other

## 2016-08-18 ENCOUNTER — Encounter (HOSPITAL_COMMUNITY): Payer: Medicare Other

## 2016-08-18 ENCOUNTER — Ambulatory Visit: Payer: Medicare Other | Admitting: Vascular Surgery

## 2016-08-22 ENCOUNTER — Encounter: Payer: Self-pay | Admitting: Vascular Surgery

## 2016-09-13 DIAGNOSIS — Z6841 Body Mass Index (BMI) 40.0 and over, adult: Secondary | ICD-10-CM | POA: Diagnosis not present

## 2016-09-13 DIAGNOSIS — E1129 Type 2 diabetes mellitus with other diabetic kidney complication: Secondary | ICD-10-CM | POA: Diagnosis not present

## 2016-09-13 DIAGNOSIS — M109 Gout, unspecified: Secondary | ICD-10-CM | POA: Diagnosis not present

## 2016-09-13 DIAGNOSIS — N189 Chronic kidney disease, unspecified: Secondary | ICD-10-CM | POA: Diagnosis not present

## 2016-09-13 DIAGNOSIS — N2581 Secondary hyperparathyroidism of renal origin: Secondary | ICD-10-CM | POA: Diagnosis not present

## 2016-09-13 DIAGNOSIS — I129 Hypertensive chronic kidney disease with stage 1 through stage 4 chronic kidney disease, or unspecified chronic kidney disease: Secondary | ICD-10-CM | POA: Diagnosis not present

## 2016-09-13 DIAGNOSIS — D631 Anemia in chronic kidney disease: Secondary | ICD-10-CM | POA: Diagnosis not present

## 2016-09-15 ENCOUNTER — Encounter: Payer: Self-pay | Admitting: Vascular Surgery

## 2016-09-20 ENCOUNTER — Other Ambulatory Visit (HOSPITAL_COMMUNITY): Payer: Self-pay | Admitting: *Deleted

## 2016-09-21 ENCOUNTER — Ambulatory Visit (HOSPITAL_COMMUNITY)
Admission: RE | Admit: 2016-09-21 | Discharge: 2016-09-21 | Disposition: A | Discharge status: Home / Self Care | Payer: Medicare Other | Source: Ambulatory Visit | Attending: Nephrology | Admitting: Nephrology

## 2016-09-21 DIAGNOSIS — D509 Iron deficiency anemia, unspecified: Secondary | ICD-10-CM | POA: Insufficient documentation

## 2016-09-21 MED ORDER — SODIUM CHLORIDE 0.9 % IV SOLN
510.0000 mg | INTRAVENOUS | Status: DC
  Administered 2016-09-21: 510 mg via INTRAVENOUS
  Filled 2016-09-21: qty 17

## 2016-09-21 NOTE — Discharge Instructions (Signed)

## 2016-09-22 ENCOUNTER — Ambulatory Visit (HOSPITAL_COMMUNITY)
Admission: RE | Admit: 2016-09-22 | Discharge: 2016-09-22 | Disposition: A | Discharge status: Home / Self Care | Payer: Medicare Other | Source: Ambulatory Visit | Attending: Vascular Surgery | Admitting: Vascular Surgery

## 2016-09-22 ENCOUNTER — Ambulatory Visit (INDEPENDENT_AMBULATORY_CARE_PROVIDER_SITE_OTHER): Payer: Medicare Other | Admitting: Vascular Surgery

## 2016-09-22 ENCOUNTER — Other Ambulatory Visit: Payer: Self-pay

## 2016-09-22 ENCOUNTER — Ambulatory Visit (INDEPENDENT_AMBULATORY_CARE_PROVIDER_SITE_OTHER)
Admission: RE | Admit: 2016-09-22 | Discharge: 2016-09-22 | Disposition: A | Discharge status: Home / Self Care | Payer: Medicare Other | Source: Ambulatory Visit | Attending: Vascular Surgery | Admitting: Vascular Surgery

## 2016-09-22 ENCOUNTER — Encounter: Payer: Self-pay | Admitting: Vascular Surgery

## 2016-09-22 VITALS — BP 137/78 | HR 82 | Temp 97.5°F | Resp 20 | Ht 60.0 in | Wt 208.0 lb

## 2016-09-22 DIAGNOSIS — Z01810 Encounter for preprocedural cardiovascular examination: Secondary | ICD-10-CM | POA: Diagnosis not present

## 2016-09-22 DIAGNOSIS — N185 Chronic kidney disease, stage 5: Secondary | ICD-10-CM | POA: Diagnosis not present

## 2016-09-22 DIAGNOSIS — N184 Chronic kidney disease, stage 4 (severe): Secondary | ICD-10-CM

## 2016-09-22 NOTE — Progress Notes (Signed)
Patient ID: Katie Garcia, female   DOB: 15-Jun-1941, 75 y.o.   MRN: [id number]  Reason for Consult: New Evaluation (CKD stage 5, Eval for AVF. )   Referred by Foye Spurling, MD  Subjective:     HPI:  Katie Garcia is a 75 y.o. female with history of CK D that has been worsening with time. She is now to states that she will soon require dialysis per Dr. Moshe Cipro. She presents today for consideration of dialysis access. She has never had surgery on any of her extremities. She is without history of DVT. She does not take blood thinners. She has no pain in either upper extremity. Her questions today or concern for having surgery and possibility of further procedures to maintain access.  Past Medical History:  Diagnosis Date  . Diabetes mellitus without complication ([place])   . Hypertension    No family history on file. No past surgical history on file.  Short Social History:  Social History  Substance Use Topics  . Smoking status: Never Smoker  . Smokeless tobacco: Never Used  . Alcohol use No    Allergies  Allergen Reactions  . Penicillins Rash    Current Outpatient Prescriptions  Medication Sig Dispense Refill  . allopurinol (ZYLOPRIM) 100 MG tablet Take 200 mg by mouth daily.     Marland Kitchen amLODipine (NORVASC) 10 MG tablet Take 10 mg by mouth daily.    Marland Kitchen aspirin 81 MG tablet Take 81 mg by mouth daily.    . calcitRIOL (ROCALTROL) 0.25 MCG capsule Take 0.25 mcg by mouth daily.    . carvedilol (COREG) 25 MG tablet Take 25 mg by mouth 2 (two) times daily with a meal.    . furosemide (LASIX) 40 MG tablet Take 40 mg by mouth daily.    . insulin aspart protamine- aspart (NOVOLOG MIX 70/30) (70-30) 100 UNIT/ML injection Inject 20 Units into the skin daily with breakfast.    . insulin detemir (LEVEMIR) 100 UNIT/ML injection Inject 0.2 mLs (20 Units total) into the skin at bedtime. (Patient taking differently: Inject 10-20 Units into the skin 2 (two) times daily. 20 in the morning and  10 at bed time) 10 mL 0  . meclizine (ANTIVERT) 12.5 MG tablet Take 1 tablet (12.5 mg total) by mouth 3 (three) times daily as needed for dizziness. 30 tablet 0  . omeprazole (PRILOSEC) 20 MG capsule Take 1 capsule (20 mg total) by mouth daily. 30 capsule 0  . rosuvastatin (CRESTOR) 10 MG tablet Take 10 mg by mouth every Monday, Wednesday, and Friday.     No current facility-administered medications for this visit.     Review of Systems  Constitutional:  Constitutional negative. HENT: HENT negative.  Eyes: Eyes negative.  Respiratory: Respiratory negative.  Cardiovascular: Cardiovascular negative.  GI: Gastrointestinal negative.  Musculoskeletal: Musculoskeletal negative.  Skin: Skin negative.  Neurological: Neurological negative. Hematologic: Hematologic/lymphatic negative.  Psychiatric: Psychiatric negative.        Objective:  Objective   Vitals:   09/22/16 1116  BP: 137/78  Pulse: 82  Resp: 20  Temp: 97.5 F (36.4 C)  TempSrc: Oral  SpO2: 98%  Weight: 208 lb (94.3 kg)  Height: 5' (1.524 m)   Body mass index is 40.62 kg/m.  Physical Exam  Constitutional: She is oriented to person, place, and time. She appears well-developed and well-nourished.  HENT:  Head: Normocephalic.  Eyes: EOM are normal.  Neck: Normal range of motion.  Cardiovascular:  Pulses:  Radial pulses are 2+ on the right side, and 2+ on the left side.  Pulmonary/Chest: Effort normal.  Abdominal: Soft.  Musculoskeletal: Normal range of motion. She exhibits no edema or deformity.  Neurological: She is alert and oriented to person, place, and time.  Skin: Skin is warm and dry.  Psychiatric: She has a normal mood and affect. Her behavior is normal. Judgment and thought content normal.    Data: Left upper extremity cephalic vein is 0.5 cm at the antecubital fossa 0.4 cm at the level of the shoulder. Her left brachial artery has a 0.5 cm intraluminal diameter is triphasic waveform.       Assessment/Plan:    75 year old [place] American female here for consideration of dialysis access creation. She has adequate cephalic vein on the left as well as brachial artery for brachiocephalic AV fistula creation. Risks and benefits including damage to arteries and veins nerves steal and likelihood of further procedures to maintain access were described to her. Along with her daughter she demonstrates good understanding. We will schedule her for left upper extremity AV fistula likely brachiocephalic in the near future with either me or one of my partners.     Waynetta Sandy MD [facility]

## 2016-09-27 ENCOUNTER — Other Ambulatory Visit (HOSPITAL_COMMUNITY): Payer: Self-pay | Admitting: *Deleted

## 2016-09-28 ENCOUNTER — Ambulatory Visit (HOSPITAL_COMMUNITY)
Admission: RE | Admit: 2016-09-28 | Discharge: 2016-09-28 | Disposition: A | Discharge status: Home / Self Care | Payer: Medicare Other | Source: Ambulatory Visit | Attending: Nephrology | Admitting: Nephrology

## 2016-09-28 DIAGNOSIS — D509 Iron deficiency anemia, unspecified: Secondary | ICD-10-CM | POA: Insufficient documentation

## 2016-09-28 MED ORDER — SODIUM CHLORIDE 0.9 % IV SOLN
510.0000 mg | INTRAVENOUS | Status: DC
  Administered 2016-09-28: 510 mg via INTRAVENOUS
  Filled 2016-09-28: qty 17

## 2016-09-29 NOTE — Progress Notes (Signed)
Several unsuccessful attempts were made to contact pt; pt has no voice mailbox available. Emergency contact has the same number listed as pt.

## 2016-10-01 NOTE — Anesthesia Preprocedure Evaluation (Addendum)
Anesthesia Evaluation  Patient identified by MRN, date of birth, ID band Patient awake    Reviewed: Allergy & Precautions, H&P , NPO status , Patient's Chart, lab work & pertinent test results  History of Anesthesia Complications Negative for: history of anesthetic complications  Airway Mallampati: II  TM Distance: >3 FB Neck ROM: full    Dental  (+) Edentulous Lower, Edentulous Upper   Pulmonary neg pulmonary ROS,    Pulmonary exam normal breath sounds clear to auscultation       Cardiovascular hypertension, Normal cardiovascular exam Rhythm:regular Rate:Normal     Neuro/Psych negative neurological ROS     GI/Hepatic Neg liver ROS, GERD  ,  Endo/Other  diabetesMorbid obesity  Renal/GU ESRFRenal disease     Musculoskeletal   Abdominal   Peds  Hematology negative hematology ROS (+)   Anesthesia Other Findings   Reproductive/Obstetrics negative OB ROS                            Anesthesia Physical Anesthesia Plan  ASA: III  Anesthesia Plan: MAC   Post-op Pain Management:    Induction: Intravenous  Airway Management Planned: Simple Face Mask  Additional Equipment:   Intra-op Plan:   Post-operative Plan:   Informed Consent: I have reviewed the patients History and Physical, chart, labs and discussed the procedure including the risks, benefits and alternatives for the proposed anesthesia with the patient or authorized representative who has indicated his/her understanding and acceptance.   Dental Advisory Given  Plan Discussed with: Anesthesiologist, CRNA and Surgeon  Anesthesia Plan Comments:        Anesthesia Quick Evaluation

## 2016-10-02 ENCOUNTER — Ambulatory Visit (HOSPITAL_COMMUNITY): Payer: Medicare Other | Admitting: Anesthesiology

## 2016-10-02 ENCOUNTER — Other Ambulatory Visit: Payer: Self-pay

## 2016-10-02 ENCOUNTER — Encounter (HOSPITAL_COMMUNITY): Payer: Self-pay | Admitting: *Deleted

## 2016-10-02 ENCOUNTER — Encounter (HOSPITAL_COMMUNITY)
Admission: RE | Disposition: A | Discharge status: Home / Self Care | Payer: Self-pay | Source: Ambulatory Visit | Attending: Vascular Surgery

## 2016-10-02 ENCOUNTER — Ambulatory Visit (HOSPITAL_COMMUNITY)
Admission: RE | Admit: 2016-10-02 | Discharge: 2016-10-02 | Disposition: A | Discharge status: Home / Self Care | Payer: Medicare Other | Source: Ambulatory Visit | Attending: Vascular Surgery | Admitting: Vascular Surgery

## 2016-10-02 DIAGNOSIS — N186 End stage renal disease: Secondary | ICD-10-CM | POA: Diagnosis not present

## 2016-10-02 DIAGNOSIS — I120 Hypertensive chronic kidney disease with stage 5 chronic kidney disease or end stage renal disease: Secondary | ICD-10-CM | POA: Insufficient documentation

## 2016-10-02 DIAGNOSIS — Z48812 Encounter for surgical aftercare following surgery on the circulatory system: Secondary | ICD-10-CM

## 2016-10-02 DIAGNOSIS — Z6841 Body Mass Index (BMI) 40.0 and over, adult: Secondary | ICD-10-CM | POA: Diagnosis not present

## 2016-10-02 DIAGNOSIS — E1122 Type 2 diabetes mellitus with diabetic chronic kidney disease: Secondary | ICD-10-CM | POA: Insufficient documentation

## 2016-10-02 DIAGNOSIS — D649 Anemia, unspecified: Secondary | ICD-10-CM | POA: Diagnosis not present

## 2016-10-02 DIAGNOSIS — N185 Chronic kidney disease, stage 5: Secondary | ICD-10-CM | POA: Diagnosis not present

## 2016-10-02 DIAGNOSIS — N179 Acute kidney failure, unspecified: Secondary | ICD-10-CM

## 2016-10-02 DIAGNOSIS — K219 Gastro-esophageal reflux disease without esophagitis: Secondary | ICD-10-CM | POA: Insufficient documentation

## 2016-10-02 HISTORY — DX: Headache: R51

## 2016-10-02 HISTORY — PX: AV FISTULA PLACEMENT: SHX1204

## 2016-10-02 HISTORY — DX: Gastro-esophageal reflux disease without esophagitis: K21.9

## 2016-10-02 HISTORY — DX: Anemia, unspecified: D64.9

## 2016-10-02 HISTORY — DX: Unspecified osteoarthritis, unspecified site: M19.90

## 2016-10-02 HISTORY — DX: Headache, unspecified: R51.9

## 2016-10-02 HISTORY — DX: Chronic kidney disease, unspecified: N18.9

## 2016-10-02 LAB — POCT I-STAT 4, (NA,K, GLUC, HGB,HCT)
GLUCOSE: 162 mg/dL — AB (ref 65–99)
HEMATOCRIT: 29 % — AB (ref 36.0–46.0)
HEMOGLOBIN: 9.9 g/dL — AB (ref 12.0–15.0)
POTASSIUM: 4.3 mmol/L (ref 3.5–5.1)
Sodium: 142 mmol/L (ref 135–145)

## 2016-10-02 LAB — GLUCOSE, CAPILLARY
GLUCOSE-CAPILLARY: 219 mg/dL — AB (ref 65–99)
Glucose-Capillary: 160 mg/dL — ABNORMAL HIGH (ref 65–99)

## 2016-10-02 SURGERY — ARTERIOVENOUS (AV) FISTULA CREATION
Anesthesia: Monitor Anesthesia Care | Site: Arm Upper | Laterality: Left

## 2016-10-02 MED ORDER — MIDAZOLAM HCL 5 MG/5ML IJ SOLN
INTRAMUSCULAR | Status: DC | PRN
  Administered 2016-10-02: 1 mg via INTRAVENOUS

## 2016-10-02 MED ORDER — PROPOFOL 10 MG/ML IV BOLUS
INTRAVENOUS | Status: DC | PRN
  Administered 2016-10-02: 20 mg via INTRAVENOUS
  Administered 2016-10-02: 10 mg via INTRAVENOUS
  Administered 2016-10-02: 20 mg via INTRAVENOUS

## 2016-10-02 MED ORDER — FENTANYL CITRATE (PF) 100 MCG/2ML IJ SOLN
INTRAMUSCULAR | Status: AC
  Filled 2016-10-02: qty 2

## 2016-10-02 MED ORDER — 0.9 % SODIUM CHLORIDE (POUR BTL) OPTIME
TOPICAL | Status: DC | PRN
  Administered 2016-10-02: 1000 mL

## 2016-10-02 MED ORDER — CHLORHEXIDINE GLUCONATE CLOTH 2 % EX PADS: 6.0000 | MEDICATED_PAD | Freq: Once | CUTANEOUS | Status: DC

## 2016-10-02 MED ORDER — OXYCODONE-ACETAMINOPHEN 5-325 MG PO TABS: 1.0000 | ORAL_TABLET | Freq: Four times a day (QID) | ORAL | 0 refills | Status: DC | PRN

## 2016-10-02 MED ORDER — PROPOFOL 500 MG/50ML IV EMUL
INTRAVENOUS | Status: DC | PRN
  Administered 2016-10-02: 50 ug/kg/min via INTRAVENOUS

## 2016-10-02 MED ORDER — VANCOMYCIN HCL IN DEXTROSE 1-5 GM/200ML-% IV SOLN
1000.0000 mg | INTRAVENOUS | Status: AC
  Administered 2016-10-02: 1000 mg via INTRAVENOUS
  Filled 2016-10-02: qty 200

## 2016-10-02 MED ORDER — PHENYLEPHRINE 40 MCG/ML (10ML) SYRINGE FOR IV PUSH (FOR BLOOD PRESSURE SUPPORT)
PREFILLED_SYRINGE | INTRAVENOUS | Status: DC | PRN
  Administered 2016-10-02: 80 ug via INTRAVENOUS
  Administered 2016-10-02: 120 ug via INTRAVENOUS
  Administered 2016-10-02: 80 ug via INTRAVENOUS
  Administered 2016-10-02: 120 ug via INTRAVENOUS

## 2016-10-02 MED ORDER — LIDOCAINE HCL (PF) 1 % IJ SOLN
INTRAMUSCULAR | Status: AC
  Filled 2016-10-02: qty 30

## 2016-10-02 MED ORDER — PROPOFOL 10 MG/ML IV BOLUS
INTRAVENOUS | Status: AC
  Filled 2016-10-02: qty 20

## 2016-10-02 MED ORDER — LIDOCAINE-EPINEPHRINE (PF) 1 %-1:200000 IJ SOLN
INTRAMUSCULAR | Status: AC
  Filled 2016-10-02: qty 30

## 2016-10-02 MED ORDER — FENTANYL CITRATE (PF) 100 MCG/2ML IJ SOLN: 25.0000 ug | INTRAMUSCULAR | Status: DC | PRN

## 2016-10-02 MED ORDER — ONDANSETRON HCL 4 MG/2ML IJ SOLN: 4.0000 mg | Freq: Once | INTRAMUSCULAR | Status: DC | PRN

## 2016-10-02 MED ORDER — SODIUM CHLORIDE 0.9 % IV SOLN
INTRAVENOUS | Status: DC
  Administered 2016-10-02: 07:00:00 via INTRAVENOUS

## 2016-10-02 MED ORDER — FENTANYL CITRATE (PF) 100 MCG/2ML IJ SOLN
INTRAMUSCULAR | Status: DC | PRN
  Administered 2016-10-02 (×2): 25 ug via INTRAVENOUS

## 2016-10-02 MED ORDER — LIDOCAINE-EPINEPHRINE (PF) 1 %-1:200000 IJ SOLN
INTRAMUSCULAR | Status: DC | PRN
  Administered 2016-10-02: 30 mL

## 2016-10-02 MED ORDER — ONDANSETRON HCL 4 MG/2ML IJ SOLN
INTRAMUSCULAR | Status: DC | PRN
  Administered 2016-10-02: 4 mg via INTRAVENOUS

## 2016-10-02 MED ORDER — PHENYLEPHRINE 40 MCG/ML (10ML) SYRINGE FOR IV PUSH (FOR BLOOD PRESSURE SUPPORT)
PREFILLED_SYRINGE | INTRAVENOUS | Status: AC
  Filled 2016-10-02: qty 10

## 2016-10-02 MED ORDER — SODIUM CHLORIDE 0.9 % IV SOLN
INTRAVENOUS | Status: DC | PRN
  Administered 2016-10-02: 08:00:00

## 2016-10-02 MED ORDER — MIDAZOLAM HCL 2 MG/2ML IJ SOLN
INTRAMUSCULAR | Status: AC
  Filled 2016-10-02: qty 2

## 2016-10-02 SURGICAL SUPPLY — 34 items
ARMBAND PINK RESTRICT EXTREMIT (MISCELLANEOUS) ×3 IMPLANT
CANISTER SUCTION 2500CC (MISCELLANEOUS) ×3 IMPLANT
CLIP TI MEDIUM 6 (CLIP) ×3 IMPLANT
CLIP TI WIDE RED SMALL 6 (CLIP) ×3 IMPLANT
COVER PROBE W GEL 5X96 (DRAPES) ×3 IMPLANT
DERMABOND ADVANCED (GAUZE/BANDAGES/DRESSINGS) ×2
DERMABOND ADVANCED .7 DNX12 (GAUZE/BANDAGES/DRESSINGS) ×1 IMPLANT
ELECT REM PT RETURN 9FT ADLT (ELECTROSURGICAL) ×3
ELECTRODE REM PT RTRN 9FT ADLT (ELECTROSURGICAL) ×1 IMPLANT
GLOVE BIO SURGEON STRL SZ 6.5 (GLOVE) ×2 IMPLANT
GLOVE BIO SURGEON STRL SZ7.5 (GLOVE) ×3 IMPLANT
GLOVE BIO SURGEONS STRL SZ 6.5 (GLOVE) ×1
GLOVE BIOGEL PI IND STRL 6.5 (GLOVE) ×2 IMPLANT
GLOVE BIOGEL PI IND STRL 7.0 (GLOVE) ×1 IMPLANT
GLOVE BIOGEL PI INDICATOR 6.5 (GLOVE) ×4
GLOVE BIOGEL PI INDICATOR 7.0 (GLOVE) ×2
GLOVE SURG SS PI 6.0 STRL IVOR (GLOVE) ×3 IMPLANT
GOWN STRL NON-REIN LRG LVL3 (GOWN DISPOSABLE) ×3 IMPLANT
GOWN STRL REUS W/ TWL LRG LVL3 (GOWN DISPOSABLE) ×1 IMPLANT
GOWN STRL REUS W/ TWL XL LVL3 (GOWN DISPOSABLE) ×1 IMPLANT
GOWN STRL REUS W/TWL LRG LVL3 (GOWN DISPOSABLE) ×3
GOWN STRL REUS W/TWL XL LVL3 (GOWN DISPOSABLE) ×2
KIT BASIN OR (CUSTOM PROCEDURE TRAY) ×3 IMPLANT
KIT ROOM TURNOVER OR (KITS) ×3 IMPLANT
LIQUID BAND (GAUZE/BANDAGES/DRESSINGS) ×3 IMPLANT
NS IRRIG 1000ML POUR BTL (IV SOLUTION) ×3 IMPLANT
PACK CV ACCESS (CUSTOM PROCEDURE TRAY) ×3 IMPLANT
PAD ARMBOARD 7.5X6 YLW CONV (MISCELLANEOUS) ×6 IMPLANT
SUT MNCRL AB 4-0 PS2 18 (SUTURE) ×3 IMPLANT
SUT PROLENE 6 0 BV (SUTURE) ×6 IMPLANT
SUT VIC AB 3-0 SH 27 (SUTURE) ×2
SUT VIC AB 3-0 SH 27X BRD (SUTURE) ×1 IMPLANT
UNDERPAD 30X30 (UNDERPADS AND DIAPERS) ×3 IMPLANT
WATER STERILE IRR 1000ML POUR (IV SOLUTION) ×3 IMPLANT

## 2016-10-02 NOTE — Anesthesia Postprocedure Evaluation (Signed)
Anesthesia Post Note  Patient: Janera Peugh Penick  Procedure(s) Performed: Procedure(s) (LRB): ARTERIOVENOUS (AV) FISTULA CREATION LEFT UPPER ARM (Left)  Patient location during evaluation: PACU Anesthesia Type: MAC Level of consciousness: awake and alert Pain management: pain level controlled Vital Signs Assessment: post-procedure vital signs reviewed and stable Respiratory status: spontaneous breathing, nonlabored ventilation, respiratory function stable and patient connected to nasal cannula oxygen Cardiovascular status: stable and blood pressure returned to baseline Anesthetic complications: no    Last Vitals:  Vitals:   10/02/16 0900 10/02/16 0914  BP: (!) 166/67 (!) 166/69  Pulse: 69 76  Resp: 12 18  Temp: 36.1 C     Last Pain: There were no vitals filed for this visit.               Zenaida Deed

## 2016-10-02 NOTE — H&P (Signed)
     History and Physical Update  The patient was interviewed and re-examined.  The patient's previous History and Physical has been reviewed and is unchanged from the office. Proceed with left arm avf likely brachiocephalic.  Brandon C. Donzetta Matters, MD [facility] Office: [phone] Pager: [phone]   10/02/2016, 7:16 AM

## 2016-10-02 NOTE — Transfer of Care (Signed)
Immediate Anesthesia Transfer of Care Note  Patient: Katie Garcia  Procedure(s) Performed: Procedure(s): ARTERIOVENOUS (AV) FISTULA CREATION LEFT UPPER ARM (Left)  Patient Location: PACU  Anesthesia Type:MAC  Level of Consciousness: awake, alert , oriented and patient cooperative  Airway & Oxygen Therapy: Patient Spontanous Breathing and Patient connected to face mask oxygen  Post-op Assessment: Report given to RN, Post -op Vital signs reviewed and stable and Patient moving all extremities X 4  Post vital signs: Reviewed and stable  Last Vitals:  Vitals:   10/02/16 0605  BP: (!) 168/53  Pulse: 74  Resp: 20  Temp: 36.8 C    Last Pain: There were no vitals filed for this visit.       Complications: No apparent anesthesia complications

## 2016-10-02 NOTE — Op Note (Signed)
    OPERATIVE NOTE   PROCEDURE: left brachiocephalic arteriovenous fistula placement  PRE-OPERATIVE DIAGNOSIS: ckd  POST-OPERATIVE DIAGNOSIS: same  SURGEON: Ardis Lawley C. Donzetta Matters, MD  ANESTHESIA: local and MAC  ESTIMATED BLOOD LOSS: 20 cc  FINDING(S): Large caliber cephalic vein, suitable brachial artery without disease. At completion palpable thrill in arm with signal at radial pulse.  SPECIMEN(S):  none  INDICATIONS:   Katie Garcia is a 75 y.o. female who presents with ckd.  The patient is scheduled for left brachiocephalic arteriovenous fistula placement.  The patient is aware the risks include but are not limited to: bleeding, infection, steal syndrome, nerve damage, ischemic monomelic neuropathy, failure to mature, and need for additional procedures.  The patient is aware of the risks of the procedure and elects to proceed forward.  DESCRIPTION: After full informed written consent was obtained from the patient, the patient was brought back to the operating room and placed supine upon the operating table.  Prior to induction, the patient received IV antibiotics.   After obtaining adequate anesthesia, the patient was then prepped and draped in the standard fashion for a left arm access procedure.  I turned my attention first to identifying the patient's cephalic vein and brachial artery.  Using SonoSite guidance, the location of these vessels were marked out on the skin.   At this point, I injected local anesthetic to obtain a field block of the antecubitum.  In total, I injected about 10 mL of 1% lidocaine with epinephrine.  I made a transverse incision at the level of the antecubitum and dissected through the subcutaneous tissue and fascia to gain exposure of the cephalic vein and  brachial artery. The distal segment of the vein was ligated with a  3-0 silk, and the vein was transected.  The proximal segment was interrogated with serial dilators.  The vein accepted up to a 4 mm dilator  without any difficulty.  I then instilled the heparinized saline into the vein and clamped it.  I made an arteriotomy with a #11 blade, and then I extended the arteriotomy with a Potts scissor.  I injected heparinized saline proximal and distal to this arteriotomy.  The vein was then sewn to the artery in an end-to-side configuration with a running stitch of 6-0 Prolene.  Prior to completing this anastomosis, I allowed the vein and artery to backbleed.  There was no evidence of clot from any vessels.  I completed the anastomosis in the usual fashion and then released all vessel loops and clamps.  There was a strong thrill in fistula and signal at the radial artery. Hemostasis was obtained. The subcutaneous tissue was reapproximated with a running stitch of 3-0 Vicryl.  The skin was then reapproximated with a running subcuticular stitch of 4-0 Vicryl.  The skin was then cleaned, dried, and reinforced with Dermabond.  The patient tolerated this procedure well.   COMPLICATIONS: none  CONDITION: good   Katie Ivins C. Donzetta Matters, MD [facility] Office: [phone] Pager: [phone]  10/02/2016, 8:15 AM

## 2016-10-02 NOTE — Anesthesia Procedure Notes (Signed)
Procedure Name: MAC Date/Time: 10/02/2016 7:30 AM Performed by: Merrilyn Puma B Pre-anesthesia Checklist: Patient identified, Emergency Drugs available, Suction available, Patient being monitored and Timeout performed Patient Re-evaluated:Patient Re-evaluated prior to inductionOxygen Delivery Method: Simple face mask Placement Confirmation: positive ETCO2,  CO2 detector and breath sounds checked- equal and bilateral Dental Injury: Teeth and Oropharynx as per pre-operative assessment

## 2016-10-03 ENCOUNTER — Telehealth: Payer: Self-pay | Admitting: Vascular Surgery

## 2016-10-03 ENCOUNTER — Encounter (HOSPITAL_COMMUNITY): Payer: Self-pay | Admitting: Vascular Surgery

## 2016-10-03 NOTE — Telephone Encounter (Signed)
-----   Message from Denman George, RN sent at 10/02/2016 12:44 PM EDT ----- Regarding: needs 6 wk f/u with (L) arm access duplex and appt. with Dr. Donzetta Matters   ----- Message ----- From: Waynetta Sandy, MD Sent: 10/02/2016   8:18 AM To: [facility] [address]  Katie Garcia [id number] [birth year]   PROCEDURE: left brachiocephalic arteriovenous fistula placement  PRE-OPERATIVE DIAGNOSIS: ckd  SURGEON: Brandon C. Donzetta Matters, MD  F/u in 6 weeks with dialysis access duplex left upper extremity

## 2016-10-03 NOTE — Telephone Encounter (Signed)
No VM set up, mailed letter to home address for [place] on 11/16 and f/u on 11/17

## 2016-10-25 DIAGNOSIS — D631 Anemia in chronic kidney disease: Secondary | ICD-10-CM | POA: Diagnosis not present

## 2016-10-25 DIAGNOSIS — Z23 Encounter for immunization: Secondary | ICD-10-CM | POA: Diagnosis not present

## 2016-10-25 DIAGNOSIS — I129 Hypertensive chronic kidney disease with stage 1 through stage 4 chronic kidney disease, or unspecified chronic kidney disease: Secondary | ICD-10-CM | POA: Diagnosis not present

## 2016-10-25 DIAGNOSIS — N189 Chronic kidney disease, unspecified: Secondary | ICD-10-CM | POA: Diagnosis not present

## 2016-10-25 DIAGNOSIS — N2581 Secondary hyperparathyroidism of renal origin: Secondary | ICD-10-CM | POA: Diagnosis not present

## 2016-10-25 DIAGNOSIS — Z6841 Body Mass Index (BMI) 40.0 and over, adult: Secondary | ICD-10-CM | POA: Diagnosis not present

## 2016-10-25 DIAGNOSIS — M109 Gout, unspecified: Secondary | ICD-10-CM | POA: Diagnosis not present

## 2016-10-25 DIAGNOSIS — E119 Type 2 diabetes mellitus without complications: Secondary | ICD-10-CM | POA: Diagnosis not present

## 2016-10-30 DIAGNOSIS — E7800 Pure hypercholesterolemia, unspecified: Secondary | ICD-10-CM | POA: Diagnosis not present

## 2016-10-30 DIAGNOSIS — M150 Primary generalized (osteo)arthritis: Secondary | ICD-10-CM | POA: Diagnosis not present

## 2016-10-30 DIAGNOSIS — E119 Type 2 diabetes mellitus without complications: Secondary | ICD-10-CM | POA: Diagnosis not present

## 2016-10-30 DIAGNOSIS — N390 Urinary tract infection, site not specified: Secondary | ICD-10-CM | POA: Diagnosis not present

## 2016-10-30 DIAGNOSIS — I10 Essential (primary) hypertension: Secondary | ICD-10-CM | POA: Diagnosis not present

## 2016-11-03 ENCOUNTER — Encounter: Payer: Self-pay | Admitting: Vascular Surgery

## 2016-11-09 ENCOUNTER — Ambulatory Visit (HOSPITAL_COMMUNITY)
Admission: RE | Admit: 2016-11-09 | Discharge: 2016-11-09 | Disposition: A | Discharge status: Home / Self Care | Payer: Medicare Other | Source: Ambulatory Visit | Attending: Vascular Surgery | Admitting: Vascular Surgery

## 2016-11-09 DIAGNOSIS — N185 Chronic kidney disease, stage 5: Secondary | ICD-10-CM | POA: Insufficient documentation

## 2016-11-09 DIAGNOSIS — Z48812 Encounter for surgical aftercare following surgery on the circulatory system: Secondary | ICD-10-CM | POA: Insufficient documentation

## 2016-11-10 ENCOUNTER — Encounter: Payer: Self-pay | Admitting: Vascular Surgery

## 2016-11-10 ENCOUNTER — Ambulatory Visit (INDEPENDENT_AMBULATORY_CARE_PROVIDER_SITE_OTHER): Payer: Medicare Other | Admitting: Vascular Surgery

## 2016-11-10 VITALS — BP 166/77 | HR 79 | Temp 98.0°F | Ht 60.0 in | Wt 215.0 lb

## 2016-11-10 DIAGNOSIS — N184 Chronic kidney disease, stage 4 (severe): Secondary | ICD-10-CM

## 2016-11-10 NOTE — Progress Notes (Signed)
Subjective:     Patient ID: Katie Garcia, female   DOB: [birth year], 75 y.o.   MRN: [id number]  HPI Katie Garcia returns for follow-up of left arm AV fistula placement. She has done well without any complaints other than coolness to her left hand. Her hand remains sensory and motor intact. She does have discomfort with bending her elbow. She is not on dialysis at this time.   Review of Systems Coolness to her hand on the left    Objective:   Physical Exam  Constitutional: She is oriented to person, place, and time. She appears well-developed.  Cardiovascular: Normal rate.   Pulmonary/Chest: Effort normal.  Musculoskeletal:  Left upper arm palpable thrill, hand is warm to touch with 5/5 strength  Neurological: She is alert and oriented to person, place, and time.  Skin: Skin is warm and dry.   Flow volume is 1595 in the fistula. Diameter of the fistula ranges from 0.6 cm 20.83 centimeters on the proximal arm. It is deep in the proximal arm at 0.8 cm but distally is more superficial. She has no internal vessel narrowing.    Assessment/Plan:      75 year old female CKD now with good functioning fistula in her left upper arm. She does not have necessity for dialysis at this time. I cleared her for use of the fistula at the first of next year. She can follow-up when necessary basis.  Chamia Schmutz C. Donzetta Matters, MD [facility] [place] Office: [phone] Pager: [phone]

## 2017-02-06 ENCOUNTER — Other Ambulatory Visit (HOSPITAL_COMMUNITY): Payer: Self-pay | Admitting: *Deleted

## 2017-02-07 ENCOUNTER — Encounter (HOSPITAL_COMMUNITY)
Admission: RE | Admit: 2017-02-07 | Discharge: 2017-02-07 | Disposition: A | Discharge status: Home / Self Care | Payer: Medicare Other | Source: Ambulatory Visit | Attending: Nephrology | Admitting: Nephrology

## 2017-02-07 DIAGNOSIS — N184 Chronic kidney disease, stage 4 (severe): Secondary | ICD-10-CM | POA: Insufficient documentation

## 2017-02-07 DIAGNOSIS — D631 Anemia in chronic kidney disease: Secondary | ICD-10-CM | POA: Insufficient documentation

## 2017-02-07 LAB — POCT HEMOGLOBIN-HEMACUE: HEMOGLOBIN: 9.8 g/dL — AB (ref 12.0–15.0)

## 2017-02-07 MED ORDER — EPOETIN ALFA 20000 UNIT/ML IJ SOLN
20000.0000 [IU] | INTRAMUSCULAR | Status: DC
  Administered 2017-02-07: 20000 [IU] via SUBCUTANEOUS

## 2017-02-07 MED ORDER — EPOETIN ALFA 20000 UNIT/ML IJ SOLN
INTRAMUSCULAR | Status: AC
  Filled 2017-02-07: qty 1

## 2017-02-07 NOTE — Discharge Instructions (Signed)
Epoetin Alfa injection °What is this medicine? °EPOETIN ALFA (e POE e tin AL fa) helps your body make more red blood cells. This medicine is used to treat anemia caused by chronic kidney failure, cancer chemotherapy, or HIV-therapy. It may also be used before surgery if you have anemia. °This medicine may be used for other purposes; ask your health care provider or pharmacist if you have questions. °COMMON BRAND NAME(S): Epogen, Procrit °What should I tell my health care provider before I take this medicine? °They need to know if you have any of these conditions: °-blood clotting disorders °-cancer patient not on chemotherapy °-cystic fibrosis °-heart disease, such as angina or heart failure °-hemoglobin level of 12 g/dL or greater °-high blood pressure °-low levels of folate, iron, or vitamin B12 °-seizures °-an unusual or allergic reaction to erythropoietin, albumin, benzyl alcohol, hamster proteins, other medicines, foods, dyes, or preservatives °-pregnant or trying to get pregnant °-breast-feeding °How should I use this medicine? °This medicine is for injection into a vein or under the skin. It is usually given by a health care professional in a hospital or clinic setting. °If you get this medicine at home, you will be taught how to prepare and give this medicine. Use exactly as directed. Take your medicine at regular intervals. Do not take your medicine more often than directed. °It is important that you put your used needles and syringes in a special sharps container. Do not put them in a trash can. If you do not have a sharps container, call your pharmacist or healthcare provider to get one. °A special MedGuide will be given to you by the pharmacist with each prescription and refill. Be sure to read this information carefully each time. °Talk to your pediatrician regarding the use of this medicine in children. While this drug may be prescribed for selected conditions, precautions do apply. °Overdosage: If you  think you have taken too much of this medicine contact a poison control center or emergency room at once. °NOTE: This medicine is only for you. Do not share this medicine with others. °What if I miss a dose? °If you miss a dose, take it as soon as you can. If it is almost time for your next dose, take only that dose. Do not take double or extra doses. °What may interact with this medicine? °Do not take this medicine with any of the following medications: °-darbepoetin alfa °This list may not describe all possible interactions. Give your health care provider a list of all the medicines, herbs, non-prescription drugs, or dietary supplements you use. Also tell them if you smoke, drink alcohol, or use illegal drugs. Some items may interact with your medicine. °What should I watch for while using this medicine? °Your condition will be monitored carefully while you are receiving this medicine. °You may need blood work done while you are taking this medicine. °What side effects may I notice from receiving this medicine? °Side effects that you should report to your doctor or health care professional as soon as possible: °-allergic reactions like skin rash, itching or hives, swelling of the face, lips, or tongue °-breathing problems °-changes in vision °-chest pain °-confusion, trouble speaking or understanding °-feeling faint or lightheaded, falls °-high blood pressure °-muscle aches or pains °-pain, swelling, warmth in the leg °-rapid weight gain °-severe headaches °-sudden numbness or weakness of the face, arm or leg °-trouble walking, dizziness, loss of balance or coordination °-seizures (convulsions) °-swelling of the ankles, feet, hands °-unusually weak or tired °  Side effects that usually do not require medical attention (report to your doctor or health care professional if they continue or are bothersome): °-diarrhea °-fever, chills (flu-like symptoms) °-headaches °-nausea, vomiting °-redness, stinging, or swelling at  site where injected °This list may not describe all possible side effects. Call your doctor for medical advice about side effects. You may report side effects to FDA at 1-800-FDA-1088. °Where should I keep my medicine? °Keep out of the reach of children. °Store in a refrigerator between 2 and 8 degrees C (36 and 46 degrees F). Do not freeze or shake. Throw away any unused portion if using a single-dose vial. Multi-dose vials can be kept in the refrigerator for up to 21 days after the initial dose. Throw away unused medicine. °NOTE: This sheet is a summary. It may not cover all possible information. If you have questions about this medicine, talk to your doctor, pharmacist, or health care provider. °© 2017 [employer] (2016-07-31 19:42:31) ° °

## 2017-02-09 ENCOUNTER — Encounter: Payer: Self-pay | Admitting: Nephrology

## 2017-02-09 DIAGNOSIS — N189 Chronic kidney disease, unspecified: Secondary | ICD-10-CM

## 2017-02-09 DIAGNOSIS — D631 Anemia in chronic kidney disease: Secondary | ICD-10-CM | POA: Insufficient documentation

## 2017-02-20 ENCOUNTER — Other Ambulatory Visit (HOSPITAL_COMMUNITY): Payer: Self-pay | Admitting: *Deleted

## 2017-02-21 ENCOUNTER — Encounter (HOSPITAL_COMMUNITY)
Admission: RE | Admit: 2017-02-21 | Discharge: 2017-02-21 | Disposition: A | Discharge status: Home / Self Care | Payer: Medicare Other | Source: Ambulatory Visit | Attending: Nephrology | Admitting: Nephrology

## 2017-02-21 DIAGNOSIS — N184 Chronic kidney disease, stage 4 (severe): Secondary | ICD-10-CM | POA: Diagnosis not present

## 2017-02-21 LAB — POCT HEMOGLOBIN-HEMACUE: HEMOGLOBIN: 10.7 g/dL — AB (ref 12.0–15.0)

## 2017-02-21 MED ORDER — EPOETIN ALFA 20000 UNIT/ML IJ SOLN
INTRAMUSCULAR | Status: AC
  Filled 2017-02-21: qty 1

## 2017-02-21 MED ORDER — EPOETIN ALFA 20000 UNIT/ML IJ SOLN
20000.0000 [IU] | Freq: Once | INTRAMUSCULAR | Status: AC
  Administered 2017-02-21: 20000 [IU] via SUBCUTANEOUS

## 2017-02-23 ENCOUNTER — Encounter: Payer: Self-pay | Admitting: Nephrology

## 2017-02-23 DIAGNOSIS — D509 Iron deficiency anemia, unspecified: Secondary | ICD-10-CM | POA: Insufficient documentation

## 2017-03-07 ENCOUNTER — Inpatient Hospital Stay (HOSPITAL_COMMUNITY)
Admission: RE | Admit: 2017-03-07 | Discharge: 2017-03-07 | Disposition: A | Discharge status: Home / Self Care | Payer: Medicare Other | Source: Ambulatory Visit | Attending: Nephrology | Admitting: Nephrology

## 2017-04-04 ENCOUNTER — Encounter (HOSPITAL_COMMUNITY)
Admission: RE | Admit: 2017-04-04 | Discharge: 2017-04-04 | Disposition: A | Discharge status: Home / Self Care | Payer: Medicare Other | Source: Ambulatory Visit | Attending: Nephrology | Admitting: Nephrology

## 2017-04-04 DIAGNOSIS — D631 Anemia in chronic kidney disease: Secondary | ICD-10-CM | POA: Insufficient documentation

## 2017-04-04 DIAGNOSIS — N184 Chronic kidney disease, stage 4 (severe): Secondary | ICD-10-CM | POA: Diagnosis not present

## 2017-04-04 LAB — IRON AND TIBC
Iron: 54 ug/dL (ref 28–170)
Saturation Ratios: 28 % (ref 10.4–31.8)
TIBC: 195 ug/dL — ABNORMAL LOW (ref 250–450)
UIBC: 141 ug/dL

## 2017-04-04 LAB — POCT HEMOGLOBIN-HEMACUE: Hemoglobin: 10.2 g/dL — ABNORMAL LOW (ref 12.0–15.0)

## 2017-04-04 LAB — FERRITIN: Ferritin: 182 ng/mL (ref 11–307)

## 2017-04-04 MED ORDER — EPOETIN ALFA 20000 UNIT/ML IJ SOLN
INTRAMUSCULAR | Status: AC
  Filled 2017-04-04: qty 1

## 2017-04-04 MED ORDER — EPOETIN ALFA 20000 UNIT/ML IJ SOLN
20000.0000 [IU] | INTRAMUSCULAR | Status: DC
  Administered 2017-04-04: 20000 [IU] via SUBCUTANEOUS

## 2017-04-18 ENCOUNTER — Encounter (HOSPITAL_COMMUNITY)
Admission: RE | Admit: 2017-04-18 | Discharge: 2017-04-18 | Disposition: A | Discharge status: Home / Self Care | Payer: Medicare Other | Source: Ambulatory Visit | Attending: Nephrology | Admitting: Nephrology

## 2017-04-18 DIAGNOSIS — N184 Chronic kidney disease, stage 4 (severe): Secondary | ICD-10-CM | POA: Diagnosis not present

## 2017-04-18 DIAGNOSIS — D631 Anemia in chronic kidney disease: Secondary | ICD-10-CM

## 2017-04-18 LAB — POCT HEMOGLOBIN-HEMACUE: Hemoglobin: 11 g/dL — ABNORMAL LOW (ref 12.0–15.0)

## 2017-04-18 MED ORDER — EPOETIN ALFA 20000 UNIT/ML IJ SOLN
20000.0000 [IU] | INTRAMUSCULAR | Status: DC
  Administered 2017-04-18: 20000 [IU] via SUBCUTANEOUS

## 2017-04-18 MED ORDER — EPOETIN ALFA 20000 UNIT/ML IJ SOLN
INTRAMUSCULAR | Status: AC
  Filled 2017-04-18: qty 1

## 2017-05-01 ENCOUNTER — Other Ambulatory Visit (HOSPITAL_COMMUNITY): Payer: Self-pay | Admitting: *Deleted

## 2017-05-02 ENCOUNTER — Encounter (HOSPITAL_COMMUNITY)
Admission: RE | Admit: 2017-05-02 | Discharge: 2017-05-02 | Disposition: A | Discharge status: Home / Self Care | Payer: Medicare Other | Source: Ambulatory Visit | Attending: Nephrology | Admitting: Nephrology

## 2017-05-02 DIAGNOSIS — D631 Anemia in chronic kidney disease: Secondary | ICD-10-CM | POA: Diagnosis present

## 2017-05-02 DIAGNOSIS — N184 Chronic kidney disease, stage 4 (severe): Secondary | ICD-10-CM | POA: Diagnosis not present

## 2017-05-02 LAB — IRON AND TIBC
IRON: 44 ug/dL (ref 28–170)
Saturation Ratios: 24 % (ref 10.4–31.8)
TIBC: 186 ug/dL — AB (ref 250–450)
UIBC: 142 ug/dL

## 2017-05-02 LAB — FERRITIN: FERRITIN: 145 ng/mL (ref 11–307)

## 2017-05-02 LAB — POCT HEMOGLOBIN-HEMACUE: Hemoglobin: 10.9 g/dL — ABNORMAL LOW (ref 12.0–15.0)

## 2017-05-02 MED ORDER — EPOETIN ALFA 20000 UNIT/ML IJ SOLN
20000.0000 [IU] | INTRAMUSCULAR | Status: DC
  Administered 2017-05-02: 10:00:00 20000 [IU] via SUBCUTANEOUS

## 2017-05-02 MED ORDER — EPOETIN ALFA 20000 UNIT/ML IJ SOLN
INTRAMUSCULAR | Status: AC
Start: 2017-05-02 — End: 2017-05-02
  Filled 2017-05-02: qty 1

## 2017-05-15 ENCOUNTER — Other Ambulatory Visit (HOSPITAL_COMMUNITY): Payer: Self-pay | Admitting: *Deleted

## 2017-05-16 ENCOUNTER — Encounter (HOSPITAL_COMMUNITY)
Admission: RE | Admit: 2017-05-16 | Discharge: 2017-05-16 | Disposition: A | Discharge status: Home / Self Care | Payer: Medicare Other | Source: Ambulatory Visit | Attending: Nephrology | Admitting: Nephrology

## 2017-05-16 DIAGNOSIS — N184 Chronic kidney disease, stage 4 (severe): Secondary | ICD-10-CM | POA: Diagnosis not present

## 2017-05-16 DIAGNOSIS — D631 Anemia in chronic kidney disease: Secondary | ICD-10-CM

## 2017-05-16 LAB — POCT HEMOGLOBIN-HEMACUE: HEMOGLOBIN: 11.6 g/dL — AB (ref 12.0–15.0)

## 2017-05-16 LAB — RENAL FUNCTION PANEL
ALBUMIN: 3.2 g/dL — AB (ref 3.5–5.0)
ANION GAP: 10 (ref 5–15)
BUN: 64 mg/dL — ABNORMAL HIGH (ref 6–20)
CHLORIDE: 110 mmol/L (ref 101–111)
CO2: 20 mmol/L — ABNORMAL LOW (ref 22–32)
Calcium: 8.9 mg/dL (ref 8.9–10.3)
Creatinine, Ser: 4.29 mg/dL — ABNORMAL HIGH (ref 0.44–1.00)
GFR calc Af Amer: 11 mL/min — ABNORMAL LOW (ref >60)
GFR, EST NON AFRICAN AMERICAN: 9 mL/min — AB (ref >60)
GLUCOSE: 192 mg/dL — AB (ref 65–99)
PHOSPHORUS: 4.6 mg/dL (ref 2.5–4.6)
POTASSIUM: 4.6 mmol/L (ref 3.5–5.1)
Sodium: 140 mmol/L (ref 135–145)

## 2017-05-16 MED ORDER — EPOETIN ALFA 20000 UNIT/ML IJ SOLN
20000.0000 [IU] | INTRAMUSCULAR | Status: DC
  Administered 2017-05-16: 10:00:00 20000 [IU] via SUBCUTANEOUS

## 2017-05-16 MED ORDER — EPOETIN ALFA 20000 UNIT/ML IJ SOLN
INTRAMUSCULAR | Status: AC
  Filled 2017-05-16: qty 1

## 2017-05-17 LAB — PTH, INTACT AND CALCIUM
Calcium, Total (PTH): 8.8 mg/dL (ref 8.7–10.3)
PTH: 133 pg/mL — AB (ref 15–65)

## 2017-05-30 ENCOUNTER — Encounter (HOSPITAL_COMMUNITY)
Admission: RE | Admit: 2017-05-30 | Discharge: 2017-05-30 | Disposition: A | Discharge status: Home / Self Care | Payer: Medicare Other | Source: Ambulatory Visit | Attending: Nephrology | Admitting: Nephrology

## 2017-05-30 DIAGNOSIS — N184 Chronic kidney disease, stage 4 (severe): Secondary | ICD-10-CM | POA: Diagnosis present

## 2017-05-30 DIAGNOSIS — D631 Anemia in chronic kidney disease: Secondary | ICD-10-CM | POA: Diagnosis present

## 2017-05-30 LAB — IRON AND TIBC
IRON: 34 ug/dL (ref 28–170)
Saturation Ratios: 18 % (ref 10.4–31.8)
TIBC: 189 ug/dL — AB (ref 250–450)
UIBC: 155 ug/dL

## 2017-05-30 LAB — FERRITIN: FERRITIN: 101 ng/mL (ref 11–307)

## 2017-05-30 LAB — POCT HEMOGLOBIN-HEMACUE: HEMOGLOBIN: 11 g/dL — AB (ref 12.0–15.0)

## 2017-05-30 MED ORDER — EPOETIN ALFA 20000 UNIT/ML IJ SOLN
20000.0000 [IU] | INTRAMUSCULAR | Status: DC
  Administered 2017-05-30: 20000 [IU] via SUBCUTANEOUS

## 2017-05-30 MED ORDER — EPOETIN ALFA 20000 UNIT/ML IJ SOLN
INTRAMUSCULAR | Status: AC
  Filled 2017-05-30: qty 1

## 2017-06-13 ENCOUNTER — Encounter (HOSPITAL_COMMUNITY)
Admission: RE | Admit: 2017-06-13 | Discharge: 2017-06-13 | Disposition: A | Discharge status: Home / Self Care | Payer: Medicare Other | Source: Ambulatory Visit | Attending: Nephrology | Admitting: Nephrology

## 2017-06-13 DIAGNOSIS — N184 Chronic kidney disease, stage 4 (severe): Secondary | ICD-10-CM | POA: Diagnosis not present

## 2017-06-13 DIAGNOSIS — D631 Anemia in chronic kidney disease: Secondary | ICD-10-CM

## 2017-06-13 LAB — POCT HEMOGLOBIN-HEMACUE: HEMOGLOBIN: 11.5 g/dL — AB (ref 12.0–15.0)

## 2017-06-13 LAB — RENAL FUNCTION PANEL
ALBUMIN: 3 g/dL — AB (ref 3.5–5.0)
Anion gap: 7 (ref 5–15)
BUN: 58 mg/dL — AB (ref 6–20)
CHLORIDE: 108 mmol/L (ref 101–111)
CO2: 24 mmol/L (ref 22–32)
Calcium: 8.4 mg/dL — ABNORMAL LOW (ref 8.9–10.3)
Creatinine, Ser: 4.18 mg/dL — ABNORMAL HIGH (ref 0.44–1.00)
GFR, EST AFRICAN AMERICAN: 11 mL/min — AB (ref >60)
GFR, EST NON AFRICAN AMERICAN: 10 mL/min — AB (ref >60)
Glucose, Bld: 237 mg/dL — ABNORMAL HIGH (ref 65–99)
PHOSPHORUS: 5 mg/dL — AB (ref 2.5–4.6)
POTASSIUM: 4.4 mmol/L (ref 3.5–5.1)
Sodium: 139 mmol/L (ref 135–145)

## 2017-06-13 MED ORDER — EPOETIN ALFA 20000 UNIT/ML IJ SOLN
INTRAMUSCULAR | Status: AC
  Administered 2017-06-13: 10:00:00 20000 [IU] via SUBCUTANEOUS
  Filled 2017-06-13: qty 1

## 2017-06-13 MED ORDER — EPOETIN ALFA 20000 UNIT/ML IJ SOLN
20000.0000 [IU] | INTRAMUSCULAR | Status: DC
  Administered 2017-06-13: 20000 [IU] via SUBCUTANEOUS

## 2017-06-14 LAB — PTH, INTACT AND CALCIUM
Calcium, Total (PTH): 8.2 mg/dL — ABNORMAL LOW (ref 8.7–10.3)
PTH: 168 pg/mL — AB (ref 15–65)

## 2017-06-26 ENCOUNTER — Encounter (HOSPITAL_COMMUNITY): Payer: Medicare Other

## 2017-07-09 ENCOUNTER — Encounter (HOSPITAL_COMMUNITY)
Admission: RE | Admit: 2017-07-09 | Discharge: 2017-07-09 | Disposition: A | Discharge status: Home / Self Care | Payer: Medicare Other | Source: Ambulatory Visit | Attending: Nephrology | Admitting: Nephrology

## 2017-07-09 DIAGNOSIS — D631 Anemia in chronic kidney disease: Secondary | ICD-10-CM | POA: Diagnosis present

## 2017-07-09 DIAGNOSIS — N184 Chronic kidney disease, stage 4 (severe): Secondary | ICD-10-CM | POA: Insufficient documentation

## 2017-07-09 LAB — RENAL FUNCTION PANEL
Albumin: 3.2 g/dL — ABNORMAL LOW (ref 3.5–5.0)
Anion gap: 8 (ref 5–15)
BUN: 55 mg/dL — ABNORMAL HIGH (ref 6–20)
CHLORIDE: 111 mmol/L (ref 101–111)
CO2: 20 mmol/L — ABNORMAL LOW (ref 22–32)
Calcium: 9.4 mg/dL (ref 8.9–10.3)
Creatinine, Ser: 4.17 mg/dL — ABNORMAL HIGH (ref 0.44–1.00)
GFR, EST AFRICAN AMERICAN: 11 mL/min — AB (ref >60)
GFR, EST NON AFRICAN AMERICAN: 10 mL/min — AB (ref >60)
Glucose, Bld: 150 mg/dL — ABNORMAL HIGH (ref 65–99)
POTASSIUM: 4.6 mmol/L (ref 3.5–5.1)
Phosphorus: 4 mg/dL (ref 2.5–4.6)
Sodium: 139 mmol/L (ref 135–145)

## 2017-07-09 LAB — IRON AND TIBC
IRON: 65 ug/dL (ref 28–170)
Saturation Ratios: 35 % — ABNORMAL HIGH (ref 10.4–31.8)
TIBC: 186 ug/dL — ABNORMAL LOW (ref 250–450)
UIBC: 121 ug/dL

## 2017-07-09 LAB — FERRITIN: Ferritin: 196 ng/mL (ref 11–307)

## 2017-07-09 LAB — POCT HEMOGLOBIN-HEMACUE: HEMOGLOBIN: 11.7 g/dL — AB (ref 12.0–15.0)

## 2017-07-09 MED ORDER — EPOETIN ALFA 20000 UNIT/ML IJ SOLN
20000.0000 [IU] | INTRAMUSCULAR | Status: DC
  Administered 2017-07-09: 10:00:00 20000 [IU] via SUBCUTANEOUS

## 2017-07-09 MED ORDER — EPOETIN ALFA 20000 UNIT/ML IJ SOLN
INTRAMUSCULAR | Status: AC
  Filled 2017-07-09: qty 1

## 2017-07-11 LAB — PTH, INTACT AND CALCIUM
CALCIUM TOTAL (PTH): 9.4 mg/dL (ref 8.7–10.3)
PTH: 59 pg/mL (ref 15–65)

## 2017-07-25 ENCOUNTER — Encounter (HOSPITAL_COMMUNITY)
Admission: RE | Admit: 2017-07-25 | Discharge: 2017-07-25 | Disposition: A | Discharge status: Home / Self Care | Payer: Medicare Other | Source: Ambulatory Visit | Attending: Nephrology | Admitting: Nephrology

## 2017-07-25 DIAGNOSIS — N184 Chronic kidney disease, stage 4 (severe): Secondary | ICD-10-CM | POA: Insufficient documentation

## 2017-07-25 DIAGNOSIS — D631 Anemia in chronic kidney disease: Secondary | ICD-10-CM | POA: Insufficient documentation

## 2017-07-25 LAB — POCT HEMOGLOBIN-HEMACUE: Hemoglobin: 11.1 g/dL — ABNORMAL LOW (ref 12.0–15.0)

## 2017-07-25 MED ORDER — EPOETIN ALFA 20000 UNIT/ML IJ SOLN
20000.0000 [IU] | INTRAMUSCULAR | Status: DC
Start: 2017-07-25 — End: 2017-07-26
  Administered 2017-07-25: 20000 [IU] via SUBCUTANEOUS

## 2017-07-25 MED ORDER — EPOETIN ALFA 20000 UNIT/ML IJ SOLN
INTRAMUSCULAR | Status: AC
  Administered 2017-07-25: 20000 [IU] via SUBCUTANEOUS
  Filled 2017-07-25: qty 1

## 2017-08-07 ENCOUNTER — Other Ambulatory Visit (HOSPITAL_COMMUNITY): Payer: Self-pay | Admitting: *Deleted

## 2017-08-08 ENCOUNTER — Encounter (HOSPITAL_COMMUNITY)
Admission: RE | Admit: 2017-08-08 | Discharge: 2017-08-08 | Disposition: A | Discharge status: Home / Self Care | Payer: Medicare Other | Source: Ambulatory Visit | Attending: Nephrology | Admitting: Nephrology

## 2017-08-08 DIAGNOSIS — N184 Chronic kidney disease, stage 4 (severe): Secondary | ICD-10-CM | POA: Diagnosis not present

## 2017-08-08 DIAGNOSIS — D631 Anemia in chronic kidney disease: Secondary | ICD-10-CM

## 2017-08-08 LAB — RENAL FUNCTION PANEL
ALBUMIN: 3 g/dL — AB (ref 3.5–5.0)
Anion gap: 12 (ref 5–15)
BUN: 75 mg/dL — ABNORMAL HIGH (ref 6–20)
CALCIUM: 8.5 mg/dL — AB (ref 8.9–10.3)
CO2: 19 mmol/L — AB (ref 22–32)
Chloride: 107 mmol/L (ref 101–111)
Creatinine, Ser: 4.73 mg/dL — ABNORMAL HIGH (ref 0.44–1.00)
GFR calc non Af Amer: 8 mL/min — ABNORMAL LOW (ref >60)
GFR, EST AFRICAN AMERICAN: 10 mL/min — AB (ref >60)
GLUCOSE: 188 mg/dL — AB (ref 65–99)
PHOSPHORUS: 5.7 mg/dL — AB (ref 2.5–4.6)
POTASSIUM: 4.3 mmol/L (ref 3.5–5.1)
SODIUM: 138 mmol/L (ref 135–145)

## 2017-08-08 LAB — IRON AND TIBC
Iron: 37 ug/dL (ref 28–170)
SATURATION RATIOS: 19 % (ref 10.4–31.8)
TIBC: 192 ug/dL — AB (ref 250–450)
UIBC: 155 ug/dL

## 2017-08-08 LAB — FERRITIN: Ferritin: 97 ng/mL (ref 11–307)

## 2017-08-08 LAB — POCT HEMOGLOBIN-HEMACUE: Hemoglobin: 11.9 g/dL — ABNORMAL LOW (ref 12.0–15.0)

## 2017-08-08 MED ORDER — EPOETIN ALFA 20000 UNIT/ML IJ SOLN
INTRAMUSCULAR | Status: AC
  Filled 2017-08-08: qty 1

## 2017-08-08 MED ORDER — EPOETIN ALFA 20000 UNIT/ML IJ SOLN
20000.0000 [IU] | INTRAMUSCULAR | Status: DC
  Administered 2017-08-08: 20000 [IU] via SUBCUTANEOUS

## 2017-08-09 LAB — PTH, INTACT AND CALCIUM
CALCIUM TOTAL (PTH): 8.4 mg/dL — AB (ref 8.7–10.3)
PTH: 144 pg/mL — AB (ref 15–65)

## 2017-08-22 ENCOUNTER — Encounter (HOSPITAL_COMMUNITY)
Admission: RE | Admit: 2017-08-22 | Discharge: 2017-08-22 | Disposition: A | Discharge status: Home / Self Care | Payer: Medicare Other | Source: Ambulatory Visit | Attending: Nephrology | Admitting: Nephrology

## 2017-08-22 DIAGNOSIS — N184 Chronic kidney disease, stage 4 (severe): Secondary | ICD-10-CM | POA: Diagnosis not present

## 2017-08-22 DIAGNOSIS — D631 Anemia in chronic kidney disease: Secondary | ICD-10-CM

## 2017-08-22 LAB — POCT HEMOGLOBIN-HEMACUE: HEMOGLOBIN: 12.3 g/dL (ref 12.0–15.0)

## 2017-08-22 MED ORDER — EPOETIN ALFA 20000 UNIT/ML IJ SOLN: 20000.0000 [IU] | INTRAMUSCULAR | Status: DC

## 2017-09-05 ENCOUNTER — Encounter (HOSPITAL_COMMUNITY): Payer: Medicare Other

## 2017-09-18 ENCOUNTER — Encounter (HOSPITAL_COMMUNITY)
Admission: RE | Admit: 2017-09-18 | Discharge: 2017-09-18 | Disposition: A | Discharge status: Home / Self Care | Payer: Medicare Other | Source: Ambulatory Visit | Attending: Nephrology | Admitting: Nephrology

## 2017-09-18 DIAGNOSIS — N184 Chronic kidney disease, stage 4 (severe): Secondary | ICD-10-CM | POA: Diagnosis not present

## 2017-09-18 DIAGNOSIS — D631 Anemia in chronic kidney disease: Secondary | ICD-10-CM | POA: Insufficient documentation

## 2017-09-18 LAB — RENAL FUNCTION PANEL
ALBUMIN: 2.8 g/dL — AB (ref 3.5–5.0)
Anion gap: 9 (ref 5–15)
BUN: 71 mg/dL — AB (ref 6–20)
CO2: 21 mmol/L — ABNORMAL LOW (ref 22–32)
CREATININE: 4.44 mg/dL — AB (ref 0.44–1.00)
Calcium: 8.2 mg/dL — ABNORMAL LOW (ref 8.9–10.3)
Chloride: 108 mmol/L (ref 101–111)
GFR calc Af Amer: 10 mL/min — ABNORMAL LOW (ref >60)
GFR, EST NON AFRICAN AMERICAN: 9 mL/min — AB (ref >60)
Glucose, Bld: 219 mg/dL — ABNORMAL HIGH (ref 65–99)
PHOSPHORUS: 5 mg/dL — AB (ref 2.5–4.6)
Potassium: 3.9 mmol/L (ref 3.5–5.1)
Sodium: 138 mmol/L (ref 135–145)

## 2017-09-18 LAB — POCT HEMOGLOBIN-HEMACUE: HEMOGLOBIN: 10.9 g/dL — AB (ref 12.0–15.0)

## 2017-09-18 LAB — IRON AND TIBC
Iron: 72 ug/dL (ref 28–170)
Saturation Ratios: 40 % — ABNORMAL HIGH (ref 10.4–31.8)
TIBC: 179 ug/dL — ABNORMAL LOW (ref 250–450)
UIBC: 107 ug/dL

## 2017-09-18 LAB — FERRITIN: FERRITIN: 181 ng/mL (ref 11–307)

## 2017-09-18 MED ORDER — EPOETIN ALFA 20000 UNIT/ML IJ SOLN
INTRAMUSCULAR | Status: AC
  Filled 2017-09-18: qty 1

## 2017-09-18 MED ORDER — EPOETIN ALFA 20000 UNIT/ML IJ SOLN
20000.0000 [IU] | INTRAMUSCULAR | Status: DC
  Administered 2017-09-18: 12:00:00 20000 [IU] via SUBCUTANEOUS

## 2017-09-19 LAB — PTH, INTACT AND CALCIUM
CALCIUM TOTAL (PTH): 8.3 mg/dL — AB (ref 8.7–10.3)
PTH: 147 pg/mL — ABNORMAL HIGH (ref 15–65)

## 2017-10-03 ENCOUNTER — Encounter (HOSPITAL_COMMUNITY)
Admission: RE | Admit: 2017-10-03 | Discharge: 2017-10-03 | Disposition: A | Discharge status: Home / Self Care | Payer: Medicare Other | Source: Ambulatory Visit | Attending: Nephrology | Admitting: Nephrology

## 2017-10-03 DIAGNOSIS — N184 Chronic kidney disease, stage 4 (severe): Secondary | ICD-10-CM | POA: Diagnosis present

## 2017-10-03 DIAGNOSIS — D631 Anemia in chronic kidney disease: Secondary | ICD-10-CM | POA: Diagnosis present

## 2017-10-03 LAB — POCT HEMOGLOBIN-HEMACUE: HEMOGLOBIN: 11.3 g/dL — AB (ref 12.0–15.0)

## 2017-10-03 MED ORDER — EPOETIN ALFA 20000 UNIT/ML IJ SOLN
20000.0000 [IU] | INTRAMUSCULAR | Status: DC
  Administered 2017-10-03: 20000 [IU] via SUBCUTANEOUS

## 2017-10-03 MED ORDER — EPOETIN ALFA 20000 UNIT/ML IJ SOLN
INTRAMUSCULAR | Status: AC
  Administered 2017-10-03: 10:00:00 20000 [IU] via SUBCUTANEOUS
  Filled 2017-10-03: qty 1

## 2017-10-17 ENCOUNTER — Encounter (HOSPITAL_COMMUNITY)
Admission: RE | Admit: 2017-10-17 | Discharge: 2017-10-17 | Disposition: A | Discharge status: Home / Self Care | Payer: Medicare Other | Source: Ambulatory Visit | Attending: Nephrology | Admitting: Nephrology

## 2017-10-17 DIAGNOSIS — N184 Chronic kidney disease, stage 4 (severe): Principal | ICD-10-CM

## 2017-10-17 DIAGNOSIS — D631 Anemia in chronic kidney disease: Secondary | ICD-10-CM

## 2017-10-17 LAB — RENAL FUNCTION PANEL
Albumin: 2.9 g/dL — ABNORMAL LOW (ref 3.5–5.0)
Anion gap: 10 (ref 5–15)
BUN: 78 mg/dL — AB (ref 6–20)
CO2: 19 mmol/L — ABNORMAL LOW (ref 22–32)
CREATININE: 4.75 mg/dL — AB (ref 0.44–1.00)
Calcium: 8.3 mg/dL — ABNORMAL LOW (ref 8.9–10.3)
Chloride: 107 mmol/L (ref 101–111)
GFR, EST AFRICAN AMERICAN: 10 mL/min — AB (ref >60)
GFR, EST NON AFRICAN AMERICAN: 8 mL/min — AB (ref >60)
Glucose, Bld: 287 mg/dL — ABNORMAL HIGH (ref 65–99)
Phosphorus: 6.5 mg/dL — ABNORMAL HIGH (ref 2.5–4.6)
Potassium: 4.7 mmol/L (ref 3.5–5.1)
Sodium: 136 mmol/L (ref 135–145)

## 2017-10-17 LAB — IRON AND TIBC
IRON: 72 ug/dL (ref 28–170)
SATURATION RATIOS: 35 % — AB (ref 10.4–31.8)
TIBC: 203 ug/dL — ABNORMAL LOW (ref 250–450)
UIBC: 131 ug/dL

## 2017-10-17 LAB — POCT HEMOGLOBIN-HEMACUE: HEMOGLOBIN: 11.9 g/dL — AB (ref 12.0–15.0)

## 2017-10-17 LAB — FERRITIN: FERRITIN: 144 ng/mL (ref 11–307)

## 2017-10-17 MED ORDER — EPOETIN ALFA 20000 UNIT/ML IJ SOLN
INTRAMUSCULAR | Status: AC
  Filled 2017-10-17: qty 1

## 2017-10-17 MED ORDER — EPOETIN ALFA 20000 UNIT/ML IJ SOLN
20000.0000 [IU] | INTRAMUSCULAR | Status: DC
  Administered 2017-10-17: 10:00:00 20000 [IU] via SUBCUTANEOUS

## 2017-10-18 LAB — PTH, INTACT AND CALCIUM
Calcium, Total (PTH): 8.2 mg/dL — ABNORMAL LOW (ref 8.7–10.3)
PTH: 205 pg/mL — ABNORMAL HIGH (ref 15–65)

## 2017-10-31 ENCOUNTER — Encounter (HOSPITAL_COMMUNITY)
Admission: RE | Admit: 2017-10-31 | Discharge: 2017-10-31 | Disposition: A | Discharge status: Home / Self Care | Payer: Medicare Other | Source: Ambulatory Visit | Attending: Nephrology | Admitting: Nephrology

## 2017-10-31 VITALS — BP 174/68 | HR 83 | Temp 98.1°F | Resp 20

## 2017-10-31 DIAGNOSIS — D631 Anemia in chronic kidney disease: Secondary | ICD-10-CM | POA: Diagnosis present

## 2017-10-31 DIAGNOSIS — N184 Chronic kidney disease, stage 4 (severe): Secondary | ICD-10-CM | POA: Diagnosis present

## 2017-10-31 LAB — POCT HEMOGLOBIN-HEMACUE: HEMOGLOBIN: 12.3 g/dL (ref 12.0–15.0)

## 2017-10-31 MED ORDER — EPOETIN ALFA 20000 UNIT/ML IJ SOLN: 20000.0000 [IU] | INTRAMUSCULAR | Status: DC

## 2017-11-14 ENCOUNTER — Inpatient Hospital Stay (HOSPITAL_COMMUNITY)
Admission: RE | Admit: 2017-11-14 | Discharge: 2017-11-14 | Disposition: A | Discharge status: Home / Self Care | Payer: Medicare Other | Source: Ambulatory Visit | Attending: Nephrology | Admitting: Nephrology

## 2017-11-14 ENCOUNTER — Encounter (HOSPITAL_COMMUNITY): Payer: Self-pay

## 2017-12-14 ENCOUNTER — Encounter (HOSPITAL_COMMUNITY)
Admission: RE | Admit: 2017-12-14 | Discharge: 2017-12-14 | Disposition: A | Discharge status: Home / Self Care | Payer: Medicare Other | Source: Ambulatory Visit | Attending: Nephrology | Admitting: Nephrology

## 2017-12-14 VITALS — BP 154/60 | HR 73 | Resp 20

## 2017-12-14 DIAGNOSIS — N184 Chronic kidney disease, stage 4 (severe): Secondary | ICD-10-CM | POA: Insufficient documentation

## 2017-12-14 DIAGNOSIS — D631 Anemia in chronic kidney disease: Secondary | ICD-10-CM | POA: Insufficient documentation

## 2017-12-14 LAB — RENAL FUNCTION PANEL
Albumin: 3 g/dL — ABNORMAL LOW (ref 3.5–5.0)
Anion gap: 9 (ref 5–15)
BUN: 71 mg/dL — AB (ref 6–20)
CHLORIDE: 107 mmol/L (ref 101–111)
CO2: 21 mmol/L — AB (ref 22–32)
CREATININE: 5.56 mg/dL — AB (ref 0.44–1.00)
Calcium: 8.3 mg/dL — ABNORMAL LOW (ref 8.9–10.3)
GFR calc non Af Amer: 7 mL/min — ABNORMAL LOW (ref >60)
GFR, EST AFRICAN AMERICAN: 8 mL/min — AB (ref >60)
Glucose, Bld: 215 mg/dL — ABNORMAL HIGH (ref 65–99)
POTASSIUM: 4.2 mmol/L (ref 3.5–5.1)
Phosphorus: 5.9 mg/dL — ABNORMAL HIGH (ref 2.5–4.6)
Sodium: 137 mmol/L (ref 135–145)

## 2017-12-14 LAB — POCT HEMOGLOBIN-HEMACUE: Hemoglobin: 10.5 g/dL — ABNORMAL LOW (ref 12.0–15.0)

## 2017-12-14 LAB — IRON AND TIBC
IRON: 70 ug/dL (ref 28–170)
Saturation Ratios: 35 % — ABNORMAL HIGH (ref 10.4–31.8)
TIBC: 202 ug/dL — AB (ref 250–450)
UIBC: 132 ug/dL

## 2017-12-14 LAB — FERRITIN: FERRITIN: 257 ng/mL (ref 11–307)

## 2017-12-14 MED ORDER — EPOETIN ALFA 20000 UNIT/ML IJ SOLN
20000.0000 [IU] | INTRAMUSCULAR | Status: DC
  Administered 2017-12-14: 20000 [IU] via SUBCUTANEOUS

## 2017-12-14 MED ORDER — EPOETIN ALFA 20000 UNIT/ML IJ SOLN
INTRAMUSCULAR | Status: AC
  Filled 2017-12-14: qty 1

## 2017-12-15 LAB — PTH, INTACT AND CALCIUM
Calcium, Total (PTH): 8.3 mg/dL — ABNORMAL LOW (ref 8.7–10.3)
PTH: 34 pg/mL (ref 15–65)

## 2018-01-04 ENCOUNTER — Encounter (HOSPITAL_COMMUNITY)
Admission: RE | Admit: 2018-01-04 | Discharge: 2018-01-04 | Disposition: A | Discharge status: Home / Self Care | Payer: Medicare Other | Source: Ambulatory Visit | Attending: Nephrology | Admitting: Nephrology

## 2018-01-04 VITALS — BP 158/66 | HR 81 | Resp 20

## 2018-01-04 DIAGNOSIS — D631 Anemia in chronic kidney disease: Secondary | ICD-10-CM | POA: Diagnosis present

## 2018-01-04 DIAGNOSIS — N184 Chronic kidney disease, stage 4 (severe): Secondary | ICD-10-CM | POA: Diagnosis present

## 2018-01-04 LAB — POCT HEMOGLOBIN-HEMACUE: Hemoglobin: 10.4 g/dL — ABNORMAL LOW (ref 12.0–15.0)

## 2018-01-04 MED ORDER — EPOETIN ALFA 20000 UNIT/ML IJ SOLN
INTRAMUSCULAR | Status: AC
  Filled 2018-01-04: qty 1

## 2018-01-04 MED ORDER — EPOETIN ALFA 20000 UNIT/ML IJ SOLN
20000.0000 [IU] | INTRAMUSCULAR | Status: DC
  Administered 2018-01-04: 09:00:00 20000 [IU] via SUBCUTANEOUS

## 2018-01-25 ENCOUNTER — Inpatient Hospital Stay (HOSPITAL_COMMUNITY)
Admission: RE | Admit: 2018-01-25 | Discharge: 2018-01-25 | Disposition: A | Discharge status: Home / Self Care | Payer: Medicare Other | Source: Ambulatory Visit | Attending: Nephrology | Admitting: Nephrology

## 2018-01-25 ENCOUNTER — Encounter (HOSPITAL_COMMUNITY): Payer: Self-pay

## 2018-02-01 ENCOUNTER — Encounter (HOSPITAL_COMMUNITY)
Admission: RE | Admit: 2018-02-01 | Discharge: 2018-02-01 | Disposition: A | Discharge status: Home / Self Care | Payer: Medicare Other | Source: Ambulatory Visit | Attending: Nephrology | Admitting: Nephrology

## 2018-02-01 VITALS — BP 173/70 | HR 76 | Temp 98.1°F | Resp 20

## 2018-02-01 DIAGNOSIS — D631 Anemia in chronic kidney disease: Secondary | ICD-10-CM | POA: Diagnosis present

## 2018-02-01 DIAGNOSIS — N184 Chronic kidney disease, stage 4 (severe): Secondary | ICD-10-CM | POA: Insufficient documentation

## 2018-02-01 LAB — IRON AND TIBC
IRON: 58 ug/dL (ref 28–170)
SATURATION RATIOS: 30 % (ref 10.4–31.8)
TIBC: 192 ug/dL — AB (ref 250–450)
UIBC: 134 ug/dL

## 2018-02-01 LAB — RENAL FUNCTION PANEL
ANION GAP: 14 (ref 5–15)
Albumin: 2.9 g/dL — ABNORMAL LOW (ref 3.5–5.0)
BUN: 75 mg/dL — ABNORMAL HIGH (ref 6–20)
CALCIUM: 8.3 mg/dL — AB (ref 8.9–10.3)
CHLORIDE: 108 mmol/L (ref 101–111)
CO2: 19 mmol/L — AB (ref 22–32)
Creatinine, Ser: 5.26 mg/dL — ABNORMAL HIGH (ref 0.44–1.00)
GFR calc Af Amer: 8 mL/min — ABNORMAL LOW (ref >60)
GFR calc non Af Amer: 7 mL/min — ABNORMAL LOW (ref >60)
GLUCOSE: 168 mg/dL — AB (ref 65–99)
Phosphorus: 5.2 mg/dL — ABNORMAL HIGH (ref 2.5–4.6)
Potassium: 4.5 mmol/L (ref 3.5–5.1)
SODIUM: 141 mmol/L (ref 135–145)

## 2018-02-01 LAB — FERRITIN: Ferritin: 232 ng/mL (ref 11–307)

## 2018-02-01 LAB — POCT HEMOGLOBIN-HEMACUE: Hemoglobin: 10.5 g/dL — ABNORMAL LOW (ref 12.0–15.0)

## 2018-02-01 MED ORDER — EPOETIN ALFA 20000 UNIT/ML IJ SOLN
INTRAMUSCULAR | Status: AC
  Filled 2018-02-01: qty 1

## 2018-02-01 MED ORDER — EPOETIN ALFA 20000 UNIT/ML IJ SOLN
20000.0000 [IU] | INTRAMUSCULAR | Status: DC
  Administered 2018-02-01: 09:00:00 20000 [IU] via SUBCUTANEOUS

## 2018-02-02 LAB — PTH, INTACT AND CALCIUM
CALCIUM TOTAL (PTH): 8.4 mg/dL — AB (ref 8.7–10.3)
PTH: 167 pg/mL — AB (ref 15–65)

## 2018-02-22 ENCOUNTER — Encounter (HOSPITAL_COMMUNITY)
Admission: RE | Admit: 2018-02-22 | Discharge: 2018-02-22 | Disposition: A | Discharge status: Home / Self Care | Payer: Medicare Other | Source: Ambulatory Visit | Attending: Nephrology | Admitting: Nephrology

## 2018-02-22 VITALS — BP 163/53 | HR 76 | Temp 98.9°F | Resp 20

## 2018-02-22 DIAGNOSIS — D631 Anemia in chronic kidney disease: Secondary | ICD-10-CM | POA: Diagnosis present

## 2018-02-22 DIAGNOSIS — N184 Chronic kidney disease, stage 4 (severe): Secondary | ICD-10-CM | POA: Insufficient documentation

## 2018-02-22 LAB — POCT HEMOGLOBIN-HEMACUE: Hemoglobin: 10.6 g/dL — ABNORMAL LOW (ref 12.0–15.0)

## 2018-02-22 MED ORDER — EPOETIN ALFA 20000 UNIT/ML IJ SOLN
20000.0000 [IU] | INTRAMUSCULAR | Status: DC
  Administered 2018-02-22: 10:00:00 20000 [IU] via SUBCUTANEOUS

## 2018-02-22 MED ORDER — EPOETIN ALFA 20000 UNIT/ML IJ SOLN
INTRAMUSCULAR | Status: AC
  Filled 2018-02-22: qty 1

## 2018-03-08 ENCOUNTER — Encounter (HOSPITAL_COMMUNITY)
Admission: RE | Admit: 2018-03-08 | Discharge: 2018-03-08 | Disposition: A | Discharge status: Home / Self Care | Payer: Medicare Other | Source: Ambulatory Visit | Attending: Nephrology | Admitting: Nephrology

## 2018-03-08 VITALS — BP 162/55 | HR 76 | Temp 98.3°F | Resp 16

## 2018-03-08 DIAGNOSIS — N184 Chronic kidney disease, stage 4 (severe): Secondary | ICD-10-CM | POA: Diagnosis not present

## 2018-03-08 DIAGNOSIS — D631 Anemia in chronic kidney disease: Secondary | ICD-10-CM

## 2018-03-08 LAB — POCT HEMOGLOBIN-HEMACUE: Hemoglobin: 10.5 g/dL — ABNORMAL LOW (ref 12.0–15.0)

## 2018-03-08 MED ORDER — EPOETIN ALFA 20000 UNIT/ML IJ SOLN
INTRAMUSCULAR | Status: AC
  Filled 2018-03-08: qty 1

## 2018-03-08 MED ORDER — EPOETIN ALFA 20000 UNIT/ML IJ SOLN
20000.0000 [IU] | INTRAMUSCULAR | Status: DC
  Administered 2018-03-08: 10:00:00 20000 [IU] via SUBCUTANEOUS

## 2018-03-26 ENCOUNTER — Encounter: Payer: Self-pay | Admitting: Registered"

## 2018-03-26 ENCOUNTER — Encounter: Payer: Medicare Other | Attending: Internal Medicine | Admitting: Registered"

## 2018-03-26 DIAGNOSIS — E1169 Type 2 diabetes mellitus with other specified complication: Secondary | ICD-10-CM | POA: Diagnosis not present

## 2018-03-26 DIAGNOSIS — IMO0002 Reserved for concepts with insufficient information to code with codable children: Secondary | ICD-10-CM

## 2018-03-26 DIAGNOSIS — Z713 Dietary counseling and surveillance: Secondary | ICD-10-CM | POA: Diagnosis not present

## 2018-03-26 DIAGNOSIS — E1365 Other specified diabetes mellitus with hyperglycemia: Secondary | ICD-10-CM

## 2018-03-26 NOTE — Progress Notes (Signed)
Diabetes Self-Management Education  Visit Type: First/Initial  Appt. Start Time: 1045 Appt. End Time: 0539  03/26/2018  [name], identified by name and date of birth, is a 77 y.o. female with a diagnosis of Diabetes: Type 2.   ASSESSMENT This patient is accompanied in the office by her daughter. Patient lives with daughters. Patient's daughter reports that one morning her mother's BG was 8 and her mother was in the kitchen getting food but could not communicate. Daughters continued to help her treat the low blood sugar with sweets until her mother was able to communicate and reported feeling better. Patient's states they continued to monitor their mother throughout the day.  Patient reports that she waits until lunchtime to take 20 units insulin because that is when she usually first eats a substantial meal. Patient reports she will take more insulin in the evening anywhere between 10 pm- midnight, 10-15 units depending on BG reading and doesn't have any specific unit amount or sliding scale she follows, just guesses how much she needs.  Patient states last night her BG was >250 mg/dL and she took 15 units, didn't have anything to eat afterward. Patient states she woke up with BG of 62 mg/dL, drank 6 oz coke and ate a jelly sandwich, did not check BG again until 2 hours later and it was 258 mg/dL.  Patient states she drinks diet coke on a regular basis, and they eat out regularly, mostly [employer], where she will get rice, meat, and collard greens and a sugar-free beverage. Patient states she has stopped eating dessert and other sweets, but does like potato chips and popcorn to snack on. Patient's daughter said they have the MyPlate handout on their refrigerator.  Patient has fistula placed, but per patient has not needed to start dialysis yet.  Diabetes Self-Management Education - 03/26/18 1059      Visit Information   Visit Type  First/Initial      Initial Visit   Diabetes  Type  Type 2    Are you currently following a meal plan?  No    Are you taking your medications as prescribed?  Yes    Date Diagnosed  ~35 years ago       Health Coping   How would you rate your overall health?  Fair      Psychosocial Assessment   Other persons present  Family Member;Patient patient lives with daughters      Complications   Last HgB A1C per patient/outside source  9.4 % 3/19    How often do you check your blood sugar?  1-2 times/day    Fasting Blood glucose range (mg/dL)  -- this am 62, has been as low as 38    Have you had a dilated eye exam in the past 12 months?  Yes    Have you had a dental exam in the past 12 months?  No    Are you checking your feet?  Yes    How many days per week are you checking your feet?  3      Dietary Intake   Breakfast  bacon, eggs, grits or rice, toast, coffee sweetn low, cream    Snack (morning)  no    Lunch  [place] vegetables, fish, rice, greens, unsweet tea OR diet soda    Snack (afternoon)  PB or cheese crackers    Dinner  chicken, string beans, rice 8 pm    Snack (evening)  popcorn, potato chips  Beverage(s)  water, diet soda, coffee, unsweet tea      Exercise   Exercise Type  Light (walking / raking leaves)    How many days per week to you exercise?  7    How many minutes per day do you exercise?  25    Total minutes per week of exercise  175      Patient Education   Nutrition management   Role of diet in the treatment of diabetes and the relationship between the three main macronutrients and blood glucose level    Medications  Other (comment) stressed importance of understanding how to use insulin and get clear instructions from MD    Monitoring  Identified appropriate SMBG and/or A1C goals.    Acute complications  Taught treatment of hypoglycemia - the 15 rule.      Individualized Goals (developed by patient)   Nutrition  General guidelines for healthy choices and portions discussed    Monitoring   test my  blood glucose as discussed take BG log to doctor's appointments      Outcomes   Expected Outcomes  Demonstrated interest in learning. Expect positive outcomes    Future DMSE  PRN    Program Status  Not Completed      Individualized Plan for Diabetes Self-Management Training:   Learning Objective:  Patient will have a greater understanding of diabetes self-management. Patient education plan is to attend individual and/or group sessions per assessed needs and concerns.   Patient Instructions   It seems the priority now is to stabilize your blood sugar so it doesn't drop so low. Understanding how to use your insulin should help.  Also try to not over treat low blood sugar - use the rule of 15 (handout)  Consider looking for the app that goes with your glucometer and download the information to take with you to your doctor appointment. If not able to get app, take your meter with you to your appointment.   Ask your doctor for the panic value when you should go to the hospital for low blood sugar.  Food: sound like you are eating balanced meals, but may want to work on portion sizes limiting rice and pasta to 1 cup.   Expected Outcomes:  Demonstrated interest in learning. Expect positive outcomes  Education material provided: A1C conversion sheet and My Plate, Types of Insulin, Low blood sugar  If problems or questions, patient to contact team via:  Phone and MyChart  Future DSME appointment: PRN

## 2018-03-26 NOTE — Patient Instructions (Signed)
   It seems the priority now is to stabilize your blood sugar so it doesn't drop so low. Understanding how to use your insulin should help.  Also try to not over treat low blood sugar - use the rule of 15 (handout)  Consider looking for the app that goes with your glucometer and download the information to take with you to your doctor appointment. If not able to get app, take your meter with you to your appointment.   Ask your doctor for the panic value when you should go to the hospital for low blood sugar.  Food: sound like you are eating balanced meals, but may want to work on portion sizes limiting rice and pasta to 1 cup.

## 2018-03-29 ENCOUNTER — Ambulatory Visit (HOSPITAL_COMMUNITY)
Admission: RE | Admit: 2018-03-29 | Discharge: 2018-03-29 | Disposition: A | Discharge status: Home / Self Care | Payer: Medicare Other | Source: Ambulatory Visit | Attending: Nephrology | Admitting: Nephrology

## 2018-03-29 VITALS — BP 146/65 | HR 72 | Temp 97.9°F | Resp 20

## 2018-03-29 DIAGNOSIS — D631 Anemia in chronic kidney disease: Secondary | ICD-10-CM | POA: Insufficient documentation

## 2018-03-29 DIAGNOSIS — N184 Chronic kidney disease, stage 4 (severe): Secondary | ICD-10-CM | POA: Diagnosis present

## 2018-03-29 LAB — RENAL FUNCTION PANEL
ANION GAP: 11 (ref 5–15)
Albumin: 3 g/dL — ABNORMAL LOW (ref 3.5–5.0)
BUN: 81 mg/dL — ABNORMAL HIGH (ref 6–20)
CHLORIDE: 108 mmol/L (ref 101–111)
CO2: 17 mmol/L — AB (ref 22–32)
CREATININE: 5.61 mg/dL — AB (ref 0.44–1.00)
Calcium: 8.4 mg/dL — ABNORMAL LOW (ref 8.9–10.3)
GFR calc Af Amer: 8 mL/min — ABNORMAL LOW (ref >60)
GFR calc non Af Amer: 7 mL/min — ABNORMAL LOW (ref >60)
GLUCOSE: 191 mg/dL — AB (ref 65–99)
Phosphorus: 5.2 mg/dL — ABNORMAL HIGH (ref 2.5–4.6)
Potassium: 4.6 mmol/L (ref 3.5–5.1)
SODIUM: 136 mmol/L (ref 135–145)

## 2018-03-29 LAB — IRON AND TIBC
IRON: 57 ug/dL (ref 28–170)
SATURATION RATIOS: 28 % (ref 10.4–31.8)
TIBC: 204 ug/dL — AB (ref 250–450)
UIBC: 147 ug/dL

## 2018-03-29 LAB — FERRITIN: FERRITIN: 135 ng/mL (ref 11–307)

## 2018-03-29 LAB — POCT HEMOGLOBIN-HEMACUE: Hemoglobin: 10.5 g/dL — ABNORMAL LOW (ref 12.0–15.0)

## 2018-03-29 MED ORDER — EPOETIN ALFA 20000 UNIT/ML IJ SOLN
INTRAMUSCULAR | Status: AC
  Filled 2018-03-29: qty 1

## 2018-03-29 MED ORDER — EPOETIN ALFA 20000 UNIT/ML IJ SOLN
20000.0000 [IU] | INTRAMUSCULAR | Status: DC
  Administered 2018-03-29: 20000 [IU] via SUBCUTANEOUS

## 2018-03-30 LAB — PTH, INTACT AND CALCIUM
Calcium, Total (PTH): 8.3 mg/dL — ABNORMAL LOW (ref 8.7–10.3)
PTH: 231 pg/mL — ABNORMAL HIGH (ref 15–65)

## 2018-04-02 ENCOUNTER — Ambulatory Visit (INDEPENDENT_AMBULATORY_CARE_PROVIDER_SITE_OTHER): Payer: Medicare Other | Admitting: Endocrinology

## 2018-04-02 ENCOUNTER — Encounter: Payer: Self-pay | Admitting: Endocrinology

## 2018-04-02 DIAGNOSIS — R011 Cardiac murmur, unspecified: Secondary | ICD-10-CM | POA: Diagnosis not present

## 2018-04-02 MED ORDER — INSULIN ASPART 100 UNIT/ML FLEXPEN: 7.0000 [IU] | PEN_INJECTOR | Freq: Three times a day (TID) | SUBCUTANEOUS | 11 refills | Status: DC

## 2018-04-02 MED ORDER — INSULIN DEGLUDEC 100 UNIT/ML ~~LOC~~ SOPN: 10.0000 [IU] | PEN_INJECTOR | Freq: Every day | SUBCUTANEOUS | 11 refills | Status: DC

## 2018-04-02 NOTE — Patient Instructions (Addendum)
good diet and exercise significantly improve the control of your diabetes.  please let me know if you wish to be referred to a dietician.  high blood sugar is very risky to your health.  you should see an eye doctor and dentist every year.  It is very important to get all recommended vaccinations.  Controlling your blood pressure and cholesterol drastically reduces the damage diabetes does to your body.  Those who smoke should quit.  Please discuss these with your doctor.  check your blood sugar twice a day.  vary the time of day when you check, between before the 3 meals, and at bedtime.  also check if you have symptoms of your blood sugar being too high or too low.  please keep a record of the readings and bring it to your next appointment here (or you can bring the meter itself).  You can write it on any piece of paper.  please call us sooner if your blood sugar goes below 70, or if you have a lot of readings over 200. Let's check the "echocardiogram" (easy and painless heart test).  you will receive a phone call, about a day and time for an appointment. For now, please: Increase the fiasp to 7 units 3 times a day (just before each meal). You don't have to take any extra, and: Reduce the tresiba to 10 units daily. Please call or message [place] next week, to tell us how the blood sugar is doing.   Please come back for a follow-up appointment in 1 month.

## 2018-04-02 NOTE — Progress Notes (Signed)
Subjective:    Patient ID: Katie Garcia, female    DOB: 1941-01-03, 77 y.o.   MRN: [id number]  HPI Pt states DM was dx'ed in 1980 (she had GDM in 1970); she has mild neuropathy of the lower extremities; she has assoc associated DR and renal failure; she has been on insulin since 2004; pt says her diet and exercise are good; she has never had pancreatitis, pancreatic surgery, or DKA.  Last episode of severe hypoglycemia was in 2017.  She takes tresiba, 16 units qd, and fiasp, 5 units 3 times a day (just before each meal).  Pt says cbg's vary from 45-400.  It is in general higher as the day goes on.   Past Medical History:  Diagnosis Date  . Anemia   . Arthritis   . Chronic kidney disease   . Diabetes mellitus without complication ([place])   . GERD (gastroesophageal reflux disease)   . Headache   . Hypertension     Past Surgical History:  Procedure Laterality Date  . ABDOMINAL HYSTERECTOMY    . AV FISTULA PLACEMENT Left 10/02/2016   Procedure: ARTERIOVENOUS (AV) FISTULA CREATION LEFT UPPER ARM;  Surgeon: Waynetta Sandy, MD;  Location: [facility];  Service: Vascular;  Laterality: Left;  . CESAREAN SECTION      Social History   Socioeconomic History  . Marital status: Married    Spouse name: Not on file  . Number of children: Not on file  . Years of education: Not on file  . Highest education level: Not on file  Occupational History  . Not on file  Social Needs  . Financial resource strain: Not on file  . Food insecurity:    Worry: Not on file    Inability: Not on file  . Transportation needs:    Medical: Not on file    Non-medical: Not on file  Tobacco Use  . Smoking status: Never Smoker  . Smokeless tobacco: Never Used  Substance and Sexual Activity  . Alcohol use: No  . Drug use: No  . Sexual activity: Not on file  Lifestyle  . Physical activity:    Days per week: Not on file    Minutes per session: Not on file  . Stress: Not on file  Relationships  . Social  connections:    Talks on phone: Not on file    Gets together: Not on file    Attends religious service: Not on file    Active member of club or organization: Not on file    Attends meetings of clubs or organizations: Not on file    Relationship status: Not on file  . Intimate partner violence:    Fear of current or ex partner: Not on file    Emotionally abused: Not on file    Physically abused: Not on file    Forced sexual activity: Not on file  Other Topics Concern  . Not on file  Social History Narrative  . Not on file    Current Outpatient Medications on File Prior to Visit  Medication Sig Dispense Refill  . allopurinol (ZYLOPRIM) 100 MG tablet Take 200 mg by mouth daily.     Marland Kitchen amLODipine (NORVASC) 10 MG tablet Take 10 mg by mouth daily.    Marland Kitchen aspirin 81 MG tablet Take 81 mg by mouth daily.    . calcitRIOL (ROCALTROL) 0.25 MCG capsule Take 0.25 mcg by mouth daily.    . Calcium Carbonate Antacid (TUMS E-X PO) Take  by mouth.    . carvedilol (COREG) 25 MG tablet Take 25 mg by mouth 2 (two) times daily with a meal.    . furosemide (LASIX) 40 MG tablet Take 80 mg by mouth daily.     . Iron-FA-B Cmp-C-Biot-Probiotic (FUSION PLUS PO) Take by mouth.    . meclizine (ANTIVERT) 12.5 MG tablet Take 1 tablet (12.5 mg total) by mouth 3 (three) times daily as needed for dizziness. 30 tablet 0  . omeprazole (PRILOSEC) 20 MG capsule Take 1 capsule (20 mg total) by mouth daily. 30 capsule 0  . Probiotic Product (RESTORA PO) Take 1 day or 1 dose by mouth.    . Pseudoephedrine-DM-GG (ROBITUSSIN CF PO) Take by mouth.    . rosuvastatin (CRESTOR) 10 MG tablet Take 10 mg by mouth every Monday, Wednesday, and Friday.     No current facility-administered medications on file prior to visit.     Allergies  Allergen Reactions  . Penicillins Rash    Family History  Problem Relation Age of Onset  . Diabetes Mother     BP (!) 168/80 (BP Location: Right Arm, Patient Position: Sitting, Cuff Size:  Large)   Pulse 76   Temp 98.3 F (36.8 C) (Oral)   Wt 216 lb 12.8 oz (98.3 kg)   SpO2 98%   BMI 42.34 kg/m    Review of Systems denies weight loss, blurry vision, headache, chest pain, sob, n/v, urinary frequency, excessive diaphoresis, memory loss, depression, cold intolerance, and rhinorrhea.  She has easy bruising, muscle cramps, and intermitt lightheadedness.       Objective:   Physical Exam VS: see vs page GEN: no distress HEAD: head: no deformity eyes: no periorbital swelling, no proptosis external nose and ears are normal mouth: no lesion seen NECK: supple, thyroid is not enlarged CHEST WALL: no deformity LUNGS: clear to auscultation CV: reg rate and rhythm; high-pitched biphasic murmur ABD: abdomen is soft, nontender.  no hepatosplenomegaly.  not distended.  no hernia MUSCULOSKELETAL: muscle bulk and strength are grossly normal.  no obvious joint swelling.  gait is normal and steady EXTEMITIES: no deformity.  no ulcer on the feet.  feet are of normal color and temp.  1+ bilat leg edema.  There is bilateral onychomycosis of the toenails.   PULSES: dorsalis pedis intact bilat.  no carotid bruit.  Access at the left antecubital area, with palpable thrill.   NEURO:  cn 2-12 grossly intact.   readily moves all 4's.  sensation is intact to touch on the feet SKIN:  Normal texture and temperature.  No rash or suspicious lesion is visible.   NODES:  None palpable at the neck PSYCH: alert, well-oriented.  Does not appear anxious nor depressed.  Lab Results  Component Value Date   CREATININE 5.61 (H) 03/29/2018   BUN 81 (H) 03/29/2018   NA 136 03/29/2018   K 4.6 03/29/2018   CL 108 03/29/2018   CO2 17 (L) 03/29/2018   outside test results are reviewed: A1c=9.4%  I have reviewed outside records, and summarized: Pt was noted to have elevated a1c, and referred here.  Other probds addressed were gout and HTN     Assessment & Plan:  Insulin-requiring type 2 DM, with ESRD:  Based on the pattern of her cbg's, she needs some adjustment in her therapy Heart murmur, new, uncertain etiology  Patient Instructions  good diet and exercise significantly improve the control of your diabetes.  please let me know if you wish to be  referred to a dietician.  high blood sugar is very risky to your health.  you should see an eye doctor and dentist every year.  It is very important to get all recommended vaccinations.  Controlling your blood pressure and cholesterol drastically reduces the damage diabetes does to your body.  Those who smoke should quit.  Please discuss these with your doctor.  check your blood sugar twice a day.  vary the time of day when you check, between before the 3 meals, and at bedtime.  also check if you have symptoms of your blood sugar being too high or too low.  please keep a record of the readings and bring it to your next appointment here (or you can bring the meter itself).  You can write it on any piece of paper.  please call us sooner if your blood sugar goes below 70, or if you have a lot of readings over 200. Let's check the "echocardiogram" (easy and painless heart test).  you will receive a phone call, about a day and time for an appointment. For now, please: Increase the fiasp to 7 units 3 times a day (just before each meal). You don't have to take any extra, and: Reduce the tresiba to 10 units daily. Please call or message [place] next week, to tell us how the blood sugar is doing.   Please come back for a follow-up appointment in 1 month.

## 2018-04-11 ENCOUNTER — Other Ambulatory Visit: Payer: Self-pay

## 2018-04-11 ENCOUNTER — Ambulatory Visit (HOSPITAL_COMMUNITY): Payer: Medicare Other | Attending: Cardiology

## 2018-04-11 DIAGNOSIS — R011 Cardiac murmur, unspecified: Secondary | ICD-10-CM

## 2018-04-11 DIAGNOSIS — I10 Essential (primary) hypertension: Secondary | ICD-10-CM | POA: Insufficient documentation

## 2018-04-11 DIAGNOSIS — I272 Pulmonary hypertension, unspecified: Secondary | ICD-10-CM | POA: Diagnosis not present

## 2018-04-11 DIAGNOSIS — N2889 Other specified disorders of kidney and ureter: Secondary | ICD-10-CM | POA: Insufficient documentation

## 2018-04-11 DIAGNOSIS — E1129 Type 2 diabetes mellitus with other diabetic kidney complication: Secondary | ICD-10-CM | POA: Insufficient documentation

## 2018-04-19 ENCOUNTER — Ambulatory Visit (HOSPITAL_COMMUNITY)
Admission: RE | Admit: 2018-04-19 | Discharge: 2018-04-19 | Disposition: A | Discharge status: Home / Self Care | Payer: Medicare Other | Source: Ambulatory Visit | Attending: Nephrology | Admitting: Nephrology

## 2018-04-19 VITALS — BP 167/59 | HR 74 | Temp 99.4°F | Resp 16

## 2018-04-19 DIAGNOSIS — N184 Chronic kidney disease, stage 4 (severe): Secondary | ICD-10-CM | POA: Diagnosis not present

## 2018-04-19 DIAGNOSIS — D631 Anemia in chronic kidney disease: Secondary | ICD-10-CM | POA: Diagnosis not present

## 2018-04-19 LAB — POCT HEMOGLOBIN-HEMACUE: Hemoglobin: 10.9 g/dL — ABNORMAL LOW (ref 12.0–15.0)

## 2018-04-19 MED ORDER — EPOETIN ALFA 20000 UNIT/ML IJ SOLN
20000.0000 [IU] | INTRAMUSCULAR | Status: DC
  Administered 2018-04-19: 20000 [IU] via SUBCUTANEOUS

## 2018-04-19 MED ORDER — EPOETIN ALFA 20000 UNIT/ML IJ SOLN
INTRAMUSCULAR | Status: AC
  Administered 2018-04-19: 20000 [IU] via SUBCUTANEOUS
  Filled 2018-04-19: qty 1

## 2018-05-07 ENCOUNTER — Ambulatory Visit (INDEPENDENT_AMBULATORY_CARE_PROVIDER_SITE_OTHER): Payer: Medicare Other | Admitting: Endocrinology

## 2018-05-07 ENCOUNTER — Encounter: Payer: Self-pay | Admitting: Endocrinology

## 2018-05-07 VITALS — BP 130/60 | HR 95 | Ht 60.0 in | Wt 213.0 lb

## 2018-05-07 DIAGNOSIS — E1365 Other specified diabetes mellitus with hyperglycemia: Secondary | ICD-10-CM

## 2018-05-07 DIAGNOSIS — IMO0002 Reserved for concepts with insufficient information to code with codable children: Secondary | ICD-10-CM

## 2018-05-07 LAB — POCT GLYCOSYLATED HEMOGLOBIN (HGB A1C): Hemoglobin A1C: 7.5

## 2018-05-07 MED ORDER — INSULIN DEGLUDEC 100 UNIT/ML ~~LOC~~ SOPN: 10.0000 [IU] | PEN_INJECTOR | Freq: Every day | SUBCUTANEOUS | 11 refills | Status: DC

## 2018-05-07 NOTE — Patient Instructions (Addendum)
check your blood sugar twice a day.  vary the time of day when you check, between before the 3 meals, and at bedtime.  also check if you have symptoms of your blood sugar being too high or too low.  please keep a record of the readings and bring it to your next appointment here (or you can bring the meter itself).  You can write it on any piece of paper.  please call us sooner if your blood sugar goes below 70, or if you have a lot of readings over 200. Increase the fiasp to 7 units 3 times a day (just before each meal), no matter what your blood sugar is.   Reduce the tresiba to 10 units daily.  Please come back for a follow-up appointment in 2 months.

## 2018-05-07 NOTE — Progress Notes (Signed)
Subjective:    Patient ID: Katie Garcia, female    DOB: 05/08/1941, 77 y.o.   MRN: [id number]  HPI Pt returns for f/u of diabetes mellitus: DM type: Insulin-requiring type 2 DM: Dx'ed: 6712 Complications: polyneuropathy, DR, and ESRD Therapy: insulin since 2004 GDM: 1970 DKA: never Severe hypoglycemia: last episode was 2017 Pancreatitis: never Pancreatic imaging: never Other: she takes multiple daily injections Interval history: Meter is downloaded today, and the printout is scanned into the record.  It varies from 63-285.   It is in general higher as the day goes on (200's).  She takes tresiba, 13 units qd, and PRN fiasp (0-7 units just before each meal).   Past Medical History:  Diagnosis Date  . Anemia   . Arthritis   . Chronic kidney disease   . Diabetes mellitus without complication ([place])   . GERD (gastroesophageal reflux disease)   . Headache   . Hypertension     Past Surgical History:  Procedure Laterality Date  . ABDOMINAL HYSTERECTOMY    . AV FISTULA PLACEMENT Left 10/02/2016   Procedure: ARTERIOVENOUS (AV) FISTULA CREATION LEFT UPPER ARM;  Surgeon: Waynetta Sandy, MD;  Location: [facility];  Service: Vascular;  Laterality: Left;  . CESAREAN SECTION      Social History   Socioeconomic History  . Marital status: Married    Spouse name: Not on file  . Number of children: Not on file  . Years of education: Not on file  . Highest education level: Not on file  Occupational History  . Not on file  Social Needs  . Financial resource strain: Not on file  . Food insecurity:    Worry: Not on file    Inability: Not on file  . Transportation needs:    Medical: Not on file    Non-medical: Not on file  Tobacco Use  . Smoking status: Never Smoker  . Smokeless tobacco: Never Used  Substance and Sexual Activity  . Alcohol use: No  . Drug use: No  . Sexual activity: Not on file  Lifestyle  . Physical activity:    Days per week: Not on file    Minutes per  session: Not on file  . Stress: Not on file  Relationships  . Social connections:    Talks on phone: Not on file    Gets together: Not on file    Attends religious service: Not on file    Active member of club or organization: Not on file    Attends meetings of clubs or organizations: Not on file    Relationship status: Not on file  . Intimate partner violence:    Fear of current or ex partner: Not on file    Emotionally abused: Not on file    Physically abused: Not on file    Forced sexual activity: Not on file  Other Topics Concern  . Not on file  Social History Narrative  . Not on file    Current Outpatient Medications on File Prior to Visit  Medication Sig Dispense Refill  . allopurinol (ZYLOPRIM) 100 MG tablet Take 200 mg by mouth daily.     Marland Kitchen amLODipine (NORVASC) 10 MG tablet Take 10 mg by mouth daily.    Marland Kitchen aspirin 81 MG tablet Take 81 mg by mouth daily.    . calcitRIOL (ROCALTROL) 0.25 MCG capsule Take 0.25 mcg by mouth daily.    . Calcium Carbonate Antacid (TUMS E-X PO) Take by mouth.    Marland Kitchen  carvedilol (COREG) 25 MG tablet Take 25 mg by mouth 2 (two) times daily with a meal.    . furosemide (LASIX) 40 MG tablet Take 80 mg by mouth daily.     . insulin aspart (FIASP FLEXTOUCH) 100 UNIT/ML FlexPen Inject 7 Units into the skin 3 (three) times daily with meals. 15 mL 11  . Iron-FA-B Cmp-C-Biot-Probiotic (FUSION PLUS PO) Take by mouth.    . meclizine (ANTIVERT) 12.5 MG tablet Take 1 tablet (12.5 mg total) by mouth 3 (three) times daily as needed for dizziness. 30 tablet 0  . omeprazole (PRILOSEC) 20 MG capsule Take 1 capsule (20 mg total) by mouth daily. 30 capsule 0  . rosuvastatin (CRESTOR) 10 MG tablet Take 10 mg by mouth every Monday, Wednesday, and Friday.     No current facility-administered medications on file prior to visit.     Allergies  Allergen Reactions  . Penicillins Rash    Family History  Problem Relation Age of Onset  . Diabetes Mother     BP 130/60  (BP Location: Left Arm, Patient Position: Sitting, Cuff Size: Large)   Pulse 95   Ht 5' (1.524 m)   Wt 213 lb (96.6 kg)   SpO2 98%   BMI 41.60 kg/m    Review of Systems Denies LOC.      Objective:   Physical Exam VITAL SIGNS:  See vs page GENERAL: no distress Pulses: dorsalis pedis intact bilat.   MSK: no deformity of the feet CV: 1+ bilat leg edema.   Skin:  no ulcer on the feet.  normal color and temp on the feet. Neuro: sensation is intact to touch on the feet Ext: There is bilateral onychomycosis of the toenails.    Lab Results  Component Value Date   HGBA1C 7.5 05/07/2018      Assessment & Plan:  Insulin-requiring type 2 DM, with ESRD: Based on the pattern of her cbg's, she needs some adjustment in her therapy.  Patient Instructions  check your blood sugar twice a day.  vary the time of day when you check, between before the 3 meals, and at bedtime.  also check if you have symptoms of your blood sugar being too high or too low.  please keep a record of the readings and bring it to your next appointment here (or you can bring the meter itself).  You can write it on any piece of paper.  please call us sooner if your blood sugar goes below 70, or if you have a lot of readings over 200. Increase the fiasp to 7 units 3 times a day (just before each meal), no matter what your blood sugar is.   Reduce the tresiba to 10 units daily.  Please come back for a follow-up appointment in 2 months.

## 2018-05-10 ENCOUNTER — Other Ambulatory Visit: Payer: Self-pay

## 2018-05-10 ENCOUNTER — Ambulatory Visit (HOSPITAL_COMMUNITY)
Admission: RE | Admit: 2018-05-10 | Discharge: 2018-05-10 | Disposition: A | Discharge status: Home / Self Care | Payer: Medicare Other | Source: Ambulatory Visit | Attending: Nephrology | Admitting: Nephrology

## 2018-05-10 ENCOUNTER — Telehealth: Payer: Self-pay | Admitting: Endocrinology

## 2018-05-10 VITALS — BP 147/56 | HR 71 | Temp 98.2°F | Resp 20

## 2018-05-10 DIAGNOSIS — D631 Anemia in chronic kidney disease: Secondary | ICD-10-CM | POA: Insufficient documentation

## 2018-05-10 DIAGNOSIS — N184 Chronic kidney disease, stage 4 (severe): Secondary | ICD-10-CM | POA: Diagnosis not present

## 2018-05-10 DIAGNOSIS — Z5181 Encounter for therapeutic drug level monitoring: Secondary | ICD-10-CM | POA: Insufficient documentation

## 2018-05-10 DIAGNOSIS — Z79899 Other long term (current) drug therapy: Secondary | ICD-10-CM | POA: Diagnosis not present

## 2018-05-10 LAB — RENAL FUNCTION PANEL
Albumin: 2.8 g/dL — ABNORMAL LOW (ref 3.5–5.0)
Anion gap: 12 (ref 5–15)
BUN: 95 mg/dL — ABNORMAL HIGH (ref 6–20)
CO2: 21 mmol/L — ABNORMAL LOW (ref 22–32)
Calcium: 8.4 mg/dL — ABNORMAL LOW (ref 8.9–10.3)
Chloride: 110 mmol/L (ref 101–111)
Creatinine, Ser: 5.97 mg/dL — ABNORMAL HIGH (ref 0.44–1.00)
GFR calc Af Amer: 7 mL/min — ABNORMAL LOW (ref >60)
GFR calc non Af Amer: 6 mL/min — ABNORMAL LOW (ref >60)
Glucose, Bld: 162 mg/dL — ABNORMAL HIGH (ref 65–99)
Phosphorus: 5.9 mg/dL — ABNORMAL HIGH (ref 2.5–4.6)
Potassium: 4.5 mmol/L (ref 3.5–5.1)
Sodium: 143 mmol/L (ref 135–145)

## 2018-05-10 LAB — IRON AND TIBC
Iron: 59 ug/dL (ref 28–170)
Saturation Ratios: 32 % — ABNORMAL HIGH (ref 10.4–31.8)
TIBC: 183 ug/dL — ABNORMAL LOW (ref 250–450)
UIBC: 124 ug/dL

## 2018-05-10 LAB — FERRITIN: Ferritin: 123 ng/mL (ref 11–307)

## 2018-05-10 LAB — POCT HEMOGLOBIN-HEMACUE: Hemoglobin: 10.6 g/dL — ABNORMAL LOW (ref 12.0–15.0)

## 2018-05-10 MED ORDER — INSULIN DEGLUDEC 100 UNIT/ML ~~LOC~~ SOPN: 10.0000 [IU] | PEN_INJECTOR | Freq: Every day | SUBCUTANEOUS | 11 refills | Status: DC

## 2018-05-10 MED ORDER — INSULIN ASPART 100 UNIT/ML FLEXPEN: 7.0000 [IU] | PEN_INJECTOR | Freq: Three times a day (TID) | SUBCUTANEOUS | 11 refills | Status: DC

## 2018-05-10 MED ORDER — EPOETIN ALFA 20000 UNIT/ML IJ SOLN: 20000.0000 [IU] | INTRAMUSCULAR | Status: DC

## 2018-05-10 MED ORDER — EPOETIN ALFA 20000 UNIT/ML IJ SOLN
INTRAMUSCULAR | Status: AC
  Administered 2018-05-10: 20000 [IU]
  Filled 2018-05-10: qty 1

## 2018-05-10 NOTE — Telephone Encounter (Signed)
I have sent both prescriptions for patient. I spoke with patient's daughter to notify her of this.

## 2018-05-10 NOTE — Telephone Encounter (Signed)
insulin degludec (TRESIBA FLEXTOUCH) 100 UNIT/ML SOPN FlexTouch Pen   insulin aspart (FIASP FLEXTOUCH) 100 UNIT/ML FlexPen  Patient stated that dr was sending these into the pharmacy and they have not received them Patient is completely out of the [place] and has missed a dosage due to being out    [id number] - , [place]

## 2018-05-11 LAB — PTH, INTACT AND CALCIUM
Calcium, Total (PTH): 8.1 mg/dL — ABNORMAL LOW (ref 8.7–10.3)
PTH: 231 pg/mL — ABNORMAL HIGH (ref 15–65)

## 2018-05-14 ENCOUNTER — Other Ambulatory Visit: Payer: Self-pay

## 2018-05-14 ENCOUNTER — Other Ambulatory Visit: Payer: Self-pay | Admitting: Endocrinology

## 2018-05-14 ENCOUNTER — Telehealth: Payer: Self-pay | Admitting: Endocrinology

## 2018-05-14 MED ORDER — INSULIN ASPART (W/NIACINAMIDE) 100 UNIT/ML ~~LOC~~ SOPN: 7.0000 [IU] | PEN_INJECTOR | Freq: Three times a day (TID) | SUBCUTANEOUS | 4 refills | Status: DC

## 2018-05-14 NOTE — Telephone Encounter (Signed)
[facility] pharmacy called stated they need a PA for the Fiasp insulin  [id number] [address], [place] #:  [id number]

## 2018-05-14 NOTE — Telephone Encounter (Signed)
[facility] physician office called stated the wrong prescription was called in, Patient need prescription for the insulin Fiasp   [id number] [address], [place] #:  [id number]

## 2018-05-14 NOTE — Telephone Encounter (Signed)
I see where I sent in novolog so I have corrected & sent in fiasp instead.

## 2018-05-16 NOTE — Telephone Encounter (Signed)
I have submitted PA & waiting on response.

## 2018-05-17 ENCOUNTER — Other Ambulatory Visit: Payer: Self-pay | Admitting: Endocrinology

## 2018-05-17 MED ORDER — INSULIN LISPRO 100 UNIT/ML (KWIKPEN): 7.0000 [IU] | PEN_INJECTOR | Freq: Three times a day (TID) | SUBCUTANEOUS | 11 refills | Status: DC

## 2018-05-30 ENCOUNTER — Other Ambulatory Visit (HOSPITAL_COMMUNITY): Payer: Self-pay | Admitting: *Deleted

## 2018-05-31 ENCOUNTER — Ambulatory Visit (HOSPITAL_COMMUNITY)
Admission: RE | Admit: 2018-05-31 | Discharge: 2018-05-31 | Disposition: A | Discharge status: Home / Self Care | Payer: Medicare Other | Source: Ambulatory Visit | Attending: Nephrology | Admitting: Nephrology

## 2018-05-31 VITALS — BP 147/55 | HR 80 | Temp 98.0°F | Resp 20

## 2018-05-31 DIAGNOSIS — N184 Chronic kidney disease, stage 4 (severe): Secondary | ICD-10-CM | POA: Insufficient documentation

## 2018-05-31 DIAGNOSIS — D631 Anemia in chronic kidney disease: Secondary | ICD-10-CM | POA: Insufficient documentation

## 2018-05-31 LAB — POCT HEMOGLOBIN-HEMACUE: Hemoglobin: 10.6 g/dL — ABNORMAL LOW (ref 12.0–15.0)

## 2018-05-31 MED ORDER — EPOETIN ALFA 20000 UNIT/ML IJ SOLN
INTRAMUSCULAR | Status: AC
  Filled 2018-05-31: qty 1

## 2018-05-31 MED ORDER — EPOETIN ALFA 20000 UNIT/ML IJ SOLN
20000.0000 [IU] | INTRAMUSCULAR | Status: DC
  Administered 2018-05-31: 20000 [IU] via SUBCUTANEOUS

## 2018-06-21 ENCOUNTER — Encounter (HOSPITAL_COMMUNITY): Payer: Medicare Other

## 2018-06-24 ENCOUNTER — Encounter (HOSPITAL_COMMUNITY)
Admission: RE | Admit: 2018-06-24 | Discharge: 2018-06-24 | Disposition: A | Discharge status: Home / Self Care | Payer: Medicare Other | Source: Ambulatory Visit | Attending: Nephrology | Admitting: Nephrology

## 2018-06-24 VITALS — BP 156/58 | HR 76 | Temp 98.0°F | Resp 20

## 2018-06-24 DIAGNOSIS — D631 Anemia in chronic kidney disease: Secondary | ICD-10-CM | POA: Insufficient documentation

## 2018-06-24 DIAGNOSIS — N184 Chronic kidney disease, stage 4 (severe): Secondary | ICD-10-CM | POA: Diagnosis not present

## 2018-06-24 LAB — POCT HEMOGLOBIN-HEMACUE: Hemoglobin: 9.9 g/dL — ABNORMAL LOW (ref 12.0–15.0)

## 2018-06-24 LAB — RENAL FUNCTION PANEL
Albumin: 3 g/dL — ABNORMAL LOW (ref 3.5–5.0)
Anion gap: 14 (ref 5–15)
BUN: 93 mg/dL — ABNORMAL HIGH (ref 8–23)
CALCIUM: 8.3 mg/dL — AB (ref 8.9–10.3)
CHLORIDE: 106 mmol/L (ref 98–111)
CO2: 19 mmol/L — AB (ref 22–32)
CREATININE: 6.49 mg/dL — AB (ref 0.44–1.00)
GFR calc Af Amer: 6 mL/min — ABNORMAL LOW (ref >60)
GFR calc non Af Amer: 6 mL/min — ABNORMAL LOW (ref >60)
GLUCOSE: 154 mg/dL — AB (ref 70–99)
Phosphorus: 6.4 mg/dL — ABNORMAL HIGH (ref 2.5–4.6)
Potassium: 4.3 mmol/L (ref 3.5–5.1)
SODIUM: 139 mmol/L (ref 135–145)

## 2018-06-24 LAB — IRON AND TIBC
IRON: 53 ug/dL (ref 28–170)
SATURATION RATIOS: 30 % (ref 10.4–31.8)
TIBC: 176 ug/dL — ABNORMAL LOW (ref 250–450)
UIBC: 123 ug/dL

## 2018-06-24 LAB — FERRITIN: FERRITIN: 132 ng/mL (ref 11–307)

## 2018-06-24 MED ORDER — EPOETIN ALFA 20000 UNIT/ML IJ SOLN
INTRAMUSCULAR | Status: AC
  Filled 2018-06-24: qty 1

## 2018-06-24 MED ORDER — EPOETIN ALFA 20000 UNIT/ML IJ SOLN
20000.0000 [IU] | INTRAMUSCULAR | Status: DC
  Administered 2018-06-24: 20000 [IU] via SUBCUTANEOUS

## 2018-06-25 LAB — PTH, INTACT AND CALCIUM
CALCIUM TOTAL (PTH): 8.3 mg/dL — AB (ref 8.7–10.3)
PTH: 234 pg/mL — ABNORMAL HIGH (ref 15–65)

## 2018-07-10 ENCOUNTER — Encounter: Payer: Self-pay | Admitting: Endocrinology

## 2018-07-10 ENCOUNTER — Ambulatory Visit (INDEPENDENT_AMBULATORY_CARE_PROVIDER_SITE_OTHER): Payer: Medicare Other | Admitting: Endocrinology

## 2018-07-10 VITALS — BP 160/70 | HR 79 | Ht 60.0 in | Wt 212.0 lb

## 2018-07-10 DIAGNOSIS — IMO0002 Reserved for concepts with insufficient information to code with codable children: Secondary | ICD-10-CM

## 2018-07-10 DIAGNOSIS — E1365 Other specified diabetes mellitus with hyperglycemia: Secondary | ICD-10-CM

## 2018-07-10 LAB — POCT GLYCOSYLATED HEMOGLOBIN (HGB A1C): Hemoglobin A1C: 7.4 % — AB (ref 4.0–5.6)

## 2018-07-10 MED ORDER — INSULIN DEGLUDEC 100 UNIT/ML ~~LOC~~ SOPN: 7.0000 [IU] | PEN_INJECTOR | Freq: Every day | SUBCUTANEOUS | 11 refills | Status: DC

## 2018-07-10 MED ORDER — INSULIN LISPRO 100 UNIT/ML (KWIKPEN)
8.0000 [IU] | PEN_INJECTOR | Freq: Three times a day (TID) | SUBCUTANEOUS | 11 refills | Status: DC
Start: 2018-07-10 — End: 2018-10-10

## 2018-07-10 NOTE — Progress Notes (Signed)
Subjective:    Patient ID: Katie Garcia, female    DOB: 05/20/1941, 77 y.o.   MRN: [id number]  HPI Pt returns for f/u of diabetes mellitus: DM type: Insulin-requiring type 2 Dx'ed: 1025 Complications: polyneuropathy, DR, and ESRD Therapy: insulin since 2004.   GDM: 1970 DKA: never.   Severe hypoglycemia: last episode was 2017.   Pancreatitis: never.   Pancreatic imaging: never.  Other: she takes multiple daily injections Interval history: no cbg record, but states cbg's vary from 57-300.   It is in general lowest fasting.  pt states she feels well in general, except for a recent scrape on the right heel.   Past Medical History:  Diagnosis Date  . Anemia   . Arthritis   . Chronic kidney disease   . Diabetes mellitus without complication ([place])   . GERD (gastroesophageal reflux disease)   . Headache   . Hypertension     Past Surgical History:  Procedure Laterality Date  . ABDOMINAL HYSTERECTOMY    . AV FISTULA PLACEMENT Left 10/02/2016   Procedure: ARTERIOVENOUS (AV) FISTULA CREATION LEFT UPPER ARM;  Surgeon: Waynetta Sandy, MD;  Location: [facility];  Service: Vascular;  Laterality: Left;  . CESAREAN SECTION      Social History   Socioeconomic History  . Marital status: Married    Spouse name: Not on file  . Number of children: Not on file  . Years of education: Not on file  . Highest education level: Not on file  Occupational History  . Not on file  Social Needs  . Financial resource strain: Not on file  . Food insecurity:    Worry: Not on file    Inability: Not on file  . Transportation needs:    Medical: Not on file    Non-medical: Not on file  Tobacco Use  . Smoking status: Never Smoker  . Smokeless tobacco: Never Used  Substance and Sexual Activity  . Alcohol use: No  . Drug use: No  . Sexual activity: Not on file  Lifestyle  . Physical activity:    Days per week: Not on file    Minutes per session: Not on file  . Stress: Not on file    Relationships  . Social connections:    Talks on phone: Not on file    Gets together: Not on file    Attends religious service: Not on file    Active member of club or organization: Not on file    Attends meetings of clubs or organizations: Not on file    Relationship status: Not on file  . Intimate partner violence:    Fear of current or ex partner: Not on file    Emotionally abused: Not on file    Physically abused: Not on file    Forced sexual activity: Not on file  Other Topics Concern  . Not on file  Social History Narrative  . Not on file    Current Outpatient Medications on File Prior to Visit  Medication Sig Dispense Refill  . allopurinol (ZYLOPRIM) 100 MG tablet Take 200 mg by mouth daily.     Marland Kitchen amLODipine (NORVASC) 10 MG tablet Take 10 mg by mouth daily.    Marland Kitchen aspirin 81 MG tablet Take 81 mg by mouth daily.    . calcitRIOL (ROCALTROL) 0.25 MCG capsule Take 0.25 mcg by mouth daily.    . Calcium Carbonate Antacid (TUMS E-X PO) Take by mouth.    . carvedilol (COREG)  25 MG tablet Take 25 mg by mouth 2 (two) times daily with a meal.    . furosemide (LASIX) 40 MG tablet Take 80 mg by mouth daily.     . insulin aspart (NOVOLOG) 100 UNIT/ML FlexPen Inject 7 Units into the skin 3 (three) times daily with meals. 15 mL 11  . Iron-FA-B Cmp-C-Biot-Probiotic (FUSION PLUS PO) Take by mouth.    . meclizine (ANTIVERT) 12.5 MG tablet Take 1 tablet (12.5 mg total) by mouth 3 (three) times daily as needed for dizziness. 30 tablet 0  . omeprazole (PRILOSEC) 20 MG capsule Take 1 capsule (20 mg total) by mouth daily. 30 capsule 0  . rosuvastatin (CRESTOR) 10 MG tablet Take 10 mg by mouth every Monday, Wednesday, and Friday.     No current facility-administered medications on file prior to visit.     Allergies  Allergen Reactions  . Penicillins Rash    Family History  Problem Relation Age of Onset  . Diabetes Mother     BP (!) 160/70 (BP Location: Left Arm, Patient Position:  Sitting, Cuff Size: Large)   Pulse 79   Ht 5' (1.524 m)   Wt 212 lb (96.2 kg)   SpO2 98%   BMI 41.40 kg/m    Review of Systems Denies LOC    Objective:   Physical Exam VITAL SIGNS:  See vs page GENERAL: no distress Pulses: dorsalis pedis intact bilat.   MSK: no deformity of the feet CV: 1+ bilat leg edema.   Skin:  no ulcer on the feet, but there is a 1 cm shallow skin avulsion at the right heel.  normal color and temp on the feet. Neuro: sensation is intact to touch on the feet Ext: There is bilateral onychomycosis of the toenails.    Lab Results  Component Value Date   HGBA1C 7.4 (A) 07/10/2018       Assessment & Plan:  Type 2 DM, with DR: Based on the pattern of her cbg's, she needs some adjustment in her therapy. ESRD: in this context, she needs more mealtime insulin, and less basal Skin avulsion, new  Patient Instructions  Your blood pressure is high today.  Please see your primary care provider soon, to have it rechecked check your blood sugar twice a day.  vary the time of day when you check, between before the 3 meals, and at bedtime.  also check if you have symptoms of your blood sugar being too high or too low.  please keep a record of the readings and bring it to your next appointment here (or you can bring the meter itself).  You can write it on any piece of paper.  please call us sooner if your blood sugar goes below 70, or if you have a lot of readings over 200. Increase the humalog to 8 units 3 times a day (just before each meal), no matter what your blood sugar is, and:  Reduce the tresiba to 7 units daily.  Please keep the scrape on you heel covered with antibiotic ointment and a bandaid, until it heals.   Please come back for a follow-up appointment in 3 months.

## 2018-07-10 NOTE — Patient Instructions (Addendum)
Your blood pressure is high today.  Please see your primary care provider soon, to have it rechecked check your blood sugar twice a day.  vary the time of day when you check, between before the 3 meals, and at bedtime.  also check if you have symptoms of your blood sugar being too high or too low.  please keep a record of the readings and bring it to your next appointment here (or you can bring the meter itself).  You can write it on any piece of paper.  please call us sooner if your blood sugar goes below 70, or if you have a lot of readings over 200. Increase the humalog to 8 units 3 times a day (just before each meal), no matter what your blood sugar is, and:  Reduce the tresiba to 7 units daily.  Please keep the scrape on you heel covered with antibiotic ointment and a bandaid, until it heals.   Please come back for a follow-up appointment in 3 months.

## 2018-07-12 ENCOUNTER — Other Ambulatory Visit (HOSPITAL_COMMUNITY): Payer: Self-pay | Admitting: *Deleted

## 2018-07-15 ENCOUNTER — Ambulatory Visit (HOSPITAL_COMMUNITY)
Admission: RE | Admit: 2018-07-15 | Discharge: 2018-07-15 | Disposition: A | Discharge status: Home / Self Care | Payer: Medicare Other | Source: Ambulatory Visit | Attending: Nephrology | Admitting: Nephrology

## 2018-07-15 VITALS — BP 165/57 | HR 70 | Temp 98.4°F

## 2018-07-15 DIAGNOSIS — N184 Chronic kidney disease, stage 4 (severe): Secondary | ICD-10-CM | POA: Insufficient documentation

## 2018-07-15 DIAGNOSIS — D631 Anemia in chronic kidney disease: Secondary | ICD-10-CM | POA: Insufficient documentation

## 2018-07-15 MED ORDER — EPOETIN ALFA 20000 UNIT/ML IJ SOLN
20000.0000 [IU] | INTRAMUSCULAR | Status: DC
  Administered 2018-07-15: 20000 [IU] via SUBCUTANEOUS

## 2018-07-15 MED ORDER — EPOETIN ALFA 20000 UNIT/ML IJ SOLN
INTRAMUSCULAR | Status: AC
  Filled 2018-07-15: qty 1

## 2018-07-16 LAB — POCT HEMOGLOBIN-HEMACUE: Hemoglobin: 10.1 g/dL — ABNORMAL LOW (ref 12.0–15.0)

## 2018-08-05 ENCOUNTER — Encounter (HOSPITAL_COMMUNITY)
Admission: RE | Admit: 2018-08-05 | Discharge: 2018-08-05 | Disposition: A | Discharge status: Home / Self Care | Payer: Medicare Other | Source: Ambulatory Visit | Attending: Nephrology | Admitting: Nephrology

## 2018-08-05 VITALS — BP 192/73 | HR 77 | Temp 98.3°F | Ht 60.0 in | Wt 212.0 lb

## 2018-08-05 DIAGNOSIS — D631 Anemia in chronic kidney disease: Secondary | ICD-10-CM | POA: Insufficient documentation

## 2018-08-05 DIAGNOSIS — N184 Chronic kidney disease, stage 4 (severe): Secondary | ICD-10-CM | POA: Insufficient documentation

## 2018-08-05 LAB — IRON AND TIBC
Iron: 53 ug/dL (ref 28–170)
SATURATION RATIOS: 28 % (ref 10.4–31.8)
TIBC: 186 ug/dL — ABNORMAL LOW (ref 250–450)
UIBC: 133 ug/dL

## 2018-08-05 LAB — RENAL FUNCTION PANEL
ALBUMIN: 3 g/dL — AB (ref 3.5–5.0)
Anion gap: 12 (ref 5–15)
BUN: 70 mg/dL — AB (ref 8–23)
CHLORIDE: 112 mmol/L — AB (ref 98–111)
CO2: 18 mmol/L — ABNORMAL LOW (ref 22–32)
CREATININE: 6.44 mg/dL — AB (ref 0.44–1.00)
Calcium: 8.2 mg/dL — ABNORMAL LOW (ref 8.9–10.3)
GFR calc Af Amer: 7 mL/min — ABNORMAL LOW (ref >60)
GFR calc non Af Amer: 6 mL/min — ABNORMAL LOW (ref >60)
GLUCOSE: 131 mg/dL — AB (ref 70–99)
PHOSPHORUS: 6.6 mg/dL — AB (ref 2.5–4.6)
POTASSIUM: 4.7 mmol/L (ref 3.5–5.1)
Sodium: 142 mmol/L (ref 135–145)

## 2018-08-05 LAB — FERRITIN: FERRITIN: 151 ng/mL (ref 11–307)

## 2018-08-05 LAB — POCT HEMOGLOBIN-HEMACUE: Hemoglobin: 10.2 g/dL — ABNORMAL LOW (ref 12.0–15.0)

## 2018-08-05 MED ORDER — EPOETIN ALFA 20000 UNIT/ML IJ SOLN
INTRAMUSCULAR | Status: AC
  Filled 2018-08-05: qty 1

## 2018-08-05 MED ORDER — EPOETIN ALFA 20000 UNIT/ML IJ SOLN
20000.0000 [IU] | INTRAMUSCULAR | Status: DC
Start: 2018-08-05 — End: 2018-08-06
  Administered 2018-08-05: 20000 [IU] via SUBCUTANEOUS

## 2018-08-06 LAB — PTH, INTACT AND CALCIUM
CALCIUM TOTAL (PTH): 8 mg/dL — AB (ref 8.7–10.3)
PTH: 217 pg/mL — ABNORMAL HIGH (ref 15–65)

## 2018-08-08 LAB — HEPATITIS B SURFACE ANTIGEN: HEP B S AG: NEGATIVE

## 2018-08-23 ENCOUNTER — Other Ambulatory Visit (HOSPITAL_COMMUNITY): Payer: Self-pay

## 2018-08-27 ENCOUNTER — Ambulatory Visit (HOSPITAL_COMMUNITY)
Admission: RE | Admit: 2018-08-27 | Discharge: 2018-08-27 | Disposition: A | Discharge status: Home / Self Care | Payer: Medicare Other | Source: Ambulatory Visit | Attending: Nephrology | Admitting: Nephrology

## 2018-08-27 VITALS — BP 148/62 | HR 72 | Temp 98.0°F | Resp 20

## 2018-08-27 DIAGNOSIS — N184 Chronic kidney disease, stage 4 (severe): Secondary | ICD-10-CM

## 2018-08-27 DIAGNOSIS — D631 Anemia in chronic kidney disease: Secondary | ICD-10-CM | POA: Diagnosis present

## 2018-08-27 DIAGNOSIS — N185 Chronic kidney disease, stage 5: Secondary | ICD-10-CM | POA: Insufficient documentation

## 2018-08-27 LAB — RENAL FUNCTION PANEL
Albumin: 2.7 g/dL — ABNORMAL LOW (ref 3.5–5.0)
Anion gap: 9 (ref 5–15)
BUN: 89 mg/dL — AB (ref 8–23)
CHLORIDE: 112 mmol/L — AB (ref 98–111)
CO2: 20 mmol/L — ABNORMAL LOW (ref 22–32)
Calcium: 8.1 mg/dL — ABNORMAL LOW (ref 8.9–10.3)
Creatinine, Ser: 6.62 mg/dL — ABNORMAL HIGH (ref 0.44–1.00)
GFR calc Af Amer: 6 mL/min — ABNORMAL LOW (ref >60)
GFR, EST NON AFRICAN AMERICAN: 5 mL/min — AB (ref >60)
Glucose, Bld: 146 mg/dL — ABNORMAL HIGH (ref 70–99)
POTASSIUM: 5 mmol/L (ref 3.5–5.1)
Phosphorus: 5.1 mg/dL — ABNORMAL HIGH (ref 2.5–4.6)
Sodium: 141 mmol/L (ref 135–145)

## 2018-08-27 LAB — CBC WITH DIFFERENTIAL/PLATELET
Abs Immature Granulocytes: 0 10*3/uL (ref 0.0–0.1)
Basophils Absolute: 0 10*3/uL (ref 0.0–0.1)
Basophils Relative: 1 %
EOS ABS: 0 10*3/uL (ref 0.0–0.7)
Eosinophils Relative: 0 %
HEMATOCRIT: 30.8 % — AB (ref 36.0–46.0)
Hemoglobin: 9.2 g/dL — ABNORMAL LOW (ref 12.0–15.0)
IMMATURE GRANULOCYTES: 0 %
LYMPHS ABS: 0.7 10*3/uL (ref 0.7–4.0)
Lymphocytes Relative: 15 %
MCH: 25.5 pg — ABNORMAL LOW (ref 26.0–34.0)
MCHC: 29.9 g/dL — ABNORMAL LOW (ref 30.0–36.0)
MCV: 85.3 fL (ref 78.0–100.0)
MONO ABS: 0.5 10*3/uL (ref 0.1–1.0)
MONOS PCT: 11 %
NEUTROS PCT: 73 %
Neutro Abs: 3.4 10*3/uL (ref 1.7–7.7)
Platelets: 200 10*3/uL (ref 150–400)
RBC: 3.61 MIL/uL — ABNORMAL LOW (ref 3.87–5.11)
RDW: 17.1 % — AB (ref 11.5–15.5)
WBC: 4.8 10*3/uL (ref 4.0–10.5)

## 2018-08-27 MED ORDER — EPOETIN ALFA 20000 UNIT/ML IJ SOLN
20000.0000 [IU] | INTRAMUSCULAR | Status: DC
  Administered 2018-08-27: 20000 [IU] via SUBCUTANEOUS

## 2018-08-27 MED ORDER — EPOETIN ALFA 20000 UNIT/ML IJ SOLN
INTRAMUSCULAR | Status: AC
  Administered 2018-08-27: 20000 [IU] via SUBCUTANEOUS
  Filled 2018-08-27: qty 1

## 2018-08-28 LAB — HEPATITIS B SURFACE ANTIGEN: HEP B S AG: NEGATIVE

## 2018-09-17 ENCOUNTER — Ambulatory Visit (HOSPITAL_COMMUNITY)
Admission: RE | Admit: 2018-09-17 | Discharge: 2018-09-17 | Disposition: A | Discharge status: Home / Self Care | Payer: Medicare Other | Source: Ambulatory Visit | Attending: Nephrology | Admitting: Nephrology

## 2018-09-17 VITALS — BP 170/70 | HR 79 | Temp 98.2°F | Resp 20

## 2018-09-17 DIAGNOSIS — D631 Anemia in chronic kidney disease: Secondary | ICD-10-CM

## 2018-09-17 DIAGNOSIS — N184 Chronic kidney disease, stage 4 (severe): Secondary | ICD-10-CM | POA: Diagnosis not present

## 2018-09-17 LAB — POCT HEMOGLOBIN-HEMACUE: Hemoglobin: 10 g/dL — ABNORMAL LOW (ref 12.0–15.0)

## 2018-09-17 LAB — IRON AND TIBC
IRON: 71 ug/dL (ref 28–170)
Saturation Ratios: 35 % — ABNORMAL HIGH (ref 10.4–31.8)
TIBC: 202 ug/dL — ABNORMAL LOW (ref 250–450)
UIBC: 131 ug/dL

## 2018-09-17 LAB — FERRITIN: Ferritin: 165 ng/mL (ref 11–307)

## 2018-09-17 MED ORDER — EPOETIN ALFA 20000 UNIT/ML IJ SOLN
INTRAMUSCULAR | Status: AC
  Administered 2018-09-17: 20000 [IU] via SUBCUTANEOUS
  Filled 2018-09-17: qty 1

## 2018-09-17 MED ORDER — EPOETIN ALFA 20000 UNIT/ML IJ SOLN
20000.0000 [IU] | INTRAMUSCULAR | Status: DC
Start: 2018-09-17 — End: 2018-09-18
  Administered 2018-09-17: 20000 [IU] via SUBCUTANEOUS

## 2018-09-18 LAB — PTH, INTACT AND CALCIUM
Calcium, Total (PTH): 7.3 mg/dL — ABNORMAL LOW (ref 8.7–10.3)
PTH: 363 pg/mL — ABNORMAL HIGH (ref 15–65)

## 2018-10-09 ENCOUNTER — Ambulatory Visit (HOSPITAL_COMMUNITY)
Admission: RE | Admit: 2018-10-09 | Discharge: 2018-10-09 | Disposition: A | Discharge status: Home / Self Care | Payer: Medicare Other | Source: Ambulatory Visit | Attending: Nephrology | Admitting: Nephrology

## 2018-10-09 VITALS — BP 159/75 | HR 75 | Resp 20

## 2018-10-09 DIAGNOSIS — N184 Chronic kidney disease, stage 4 (severe): Secondary | ICD-10-CM | POA: Diagnosis present

## 2018-10-09 DIAGNOSIS — D631 Anemia in chronic kidney disease: Secondary | ICD-10-CM

## 2018-10-09 LAB — RENAL FUNCTION PANEL
ALBUMIN: 3 g/dL — AB (ref 3.5–5.0)
ANION GAP: 11 (ref 5–15)
BUN: 78 mg/dL — ABNORMAL HIGH (ref 8–23)
CO2: 20 mmol/L — ABNORMAL LOW (ref 22–32)
CREATININE: 7.43 mg/dL — AB (ref 0.44–1.00)
Calcium: 8.3 mg/dL — ABNORMAL LOW (ref 8.9–10.3)
Chloride: 108 mmol/L (ref 98–111)
GFR calc non Af Amer: 5 mL/min — ABNORMAL LOW (ref >60)
GFR, EST AFRICAN AMERICAN: 6 mL/min — AB (ref >60)
Glucose, Bld: 218 mg/dL — ABNORMAL HIGH (ref 70–99)
PHOSPHORUS: 5.1 mg/dL — AB (ref 2.5–4.6)
Potassium: 4.7 mmol/L (ref 3.5–5.1)
Sodium: 139 mmol/L (ref 135–145)

## 2018-10-09 LAB — CBC WITH DIFFERENTIAL/PLATELET
Abs Immature Granulocytes: 0.01 10*3/uL (ref 0.00–0.07)
BASOS ABS: 0 10*3/uL (ref 0.0–0.1)
Basophils Relative: 1 %
EOS PCT: 1 %
Eosinophils Absolute: 0.1 10*3/uL (ref 0.0–0.5)
HEMATOCRIT: 33.8 % — AB (ref 36.0–46.0)
Hemoglobin: 9.9 g/dL — ABNORMAL LOW (ref 12.0–15.0)
Immature Granulocytes: 0 %
Lymphocytes Relative: 17 %
Lymphs Abs: 0.8 10*3/uL (ref 0.7–4.0)
MCH: 25 pg — ABNORMAL LOW (ref 26.0–34.0)
MCHC: 29.3 g/dL — AB (ref 30.0–36.0)
MCV: 85.4 fL (ref 80.0–100.0)
Monocytes Absolute: 0.5 10*3/uL (ref 0.1–1.0)
Monocytes Relative: 10 %
NRBC: 0 % (ref 0.0–0.2)
Neutro Abs: 3.6 10*3/uL (ref 1.7–7.7)
Neutrophils Relative %: 71 %
Platelets: 222 10*3/uL (ref 150–400)
RBC: 3.96 MIL/uL (ref 3.87–5.11)
RDW: 16.7 % — ABNORMAL HIGH (ref 11.5–15.5)
WBC: 5 10*3/uL (ref 4.0–10.5)

## 2018-10-09 MED ORDER — EPOETIN ALFA 20000 UNIT/ML IJ SOLN
20000.0000 [IU] | INTRAMUSCULAR | Status: DC
  Administered 2018-10-09: 20000 [IU] via SUBCUTANEOUS

## 2018-10-09 MED ORDER — EPOETIN ALFA 20000 UNIT/ML IJ SOLN
INTRAMUSCULAR | Status: AC
  Filled 2018-10-09: qty 1

## 2018-10-10 ENCOUNTER — Encounter: Payer: Self-pay | Admitting: Endocrinology

## 2018-10-10 ENCOUNTER — Ambulatory Visit (INDEPENDENT_AMBULATORY_CARE_PROVIDER_SITE_OTHER): Payer: Medicare Other | Admitting: Endocrinology

## 2018-10-10 VITALS — BP 146/70 | HR 81 | Ht 60.0 in | Wt 213.0 lb

## 2018-10-10 DIAGNOSIS — L600 Ingrowing nail: Secondary | ICD-10-CM | POA: Insufficient documentation

## 2018-10-10 DIAGNOSIS — IMO0002 Reserved for concepts with insufficient information to code with codable children: Secondary | ICD-10-CM

## 2018-10-10 DIAGNOSIS — E1365 Other specified diabetes mellitus with hyperglycemia: Secondary | ICD-10-CM | POA: Diagnosis not present

## 2018-10-10 LAB — POCT GLYCOSYLATED HEMOGLOBIN (HGB A1C): HEMOGLOBIN A1C: 7.7 % — AB (ref 4.0–5.6)

## 2018-10-10 MED ORDER — INSULIN LISPRO 100 UNIT/ML (KWIKPEN): PEN_INJECTOR | SUBCUTANEOUS | 11 refills | Status: DC

## 2018-10-10 NOTE — Patient Instructions (Addendum)
Your blood pressure is high today.  Please see your primary care provider soon, to have it rechecked check your blood sugar twice a day.  vary the time of day when you check, between before the 3 meals, and at bedtime.  also check if you have symptoms of your blood sugar being too high or too low.  please keep a record of the readings and bring it to your next appointment here (or you can bring the meter itself).  You can write it on any piece of paper.  please call us sooner if your blood sugar goes below 70, or if you have a lot of readings over 200. Please reduce the humalog to 3 times a day (just before each meal) 8-7-8 units, and: Please continue the same tresiba Please see a foot specialist.  you will receive a phone call, about a day and time for an appointment.   Please come back for a follow-up appointment in 3 months.

## 2018-10-10 NOTE — Progress Notes (Signed)
Subjective:    Patient ID: Katie Garcia, female    DOB: 19-Oct-1941, 77 y.o.   MRN: [id number]  HPI Pt returns for f/u of diabetes mellitus: DM type: Insulin-requiring type 2 Dx'ed: 6222 Complications: polyneuropathy, DR, and ESRD Therapy: insulin since 2004.   GDM: 1970 DKA: never.   Severe hypoglycemia: last episode was 2017.   Pancreatitis: never.   Pancreatic imaging: never.  Other: she takes multiple daily injections Interval history: no cbg record, but states cbg's vary from 65-300.   It is in general lowest in the afternoon.  pt states she feels well in general, except for pain at the left great toe.   Past Medical History:  Diagnosis Date  . Anemia   . Arthritis   . Chronic kidney disease   . Diabetes mellitus without complication ([place])   . GERD (gastroesophageal reflux disease)   . Headache   . Hypertension     Past Surgical History:  Procedure Laterality Date  . ABDOMINAL HYSTERECTOMY    . AV FISTULA PLACEMENT Left 10/02/2016   Procedure: ARTERIOVENOUS (AV) FISTULA CREATION LEFT UPPER ARM;  Surgeon: Waynetta Sandy, MD;  Location: [facility];  Service: Vascular;  Laterality: Left;  . CESAREAN SECTION      Social History   Socioeconomic History  . Marital status: Married    Spouse name: Not on file  . Number of children: Not on file  . Years of education: Not on file  . Highest education level: Not on file  Occupational History  . Not on file  Social Needs  . Financial resource strain: Not on file  . Food insecurity:    Worry: Not on file    Inability: Not on file  . Transportation needs:    Medical: Not on file    Non-medical: Not on file  Tobacco Use  . Smoking status: Never Smoker  . Smokeless tobacco: Never Used  Substance and Sexual Activity  . Alcohol use: No  . Drug use: No  . Sexual activity: Not on file  Lifestyle  . Physical activity:    Days per week: Not on file    Minutes per session: Not on file  . Stress: Not on file    Relationships  . Social connections:    Talks on phone: Not on file    Gets together: Not on file    Attends religious service: Not on file    Active member of club or organization: Not on file    Attends meetings of clubs or organizations: Not on file    Relationship status: Not on file  . Intimate partner violence:    Fear of current or ex partner: Not on file    Emotionally abused: Not on file    Physically abused: Not on file    Forced sexual activity: Not on file  Other Topics Concern  . Not on file  Social History Narrative  . Not on file    Current Outpatient Medications on File Prior to Visit  Medication Sig Dispense Refill  . allopurinol (ZYLOPRIM) 100 MG tablet Take 200 mg by mouth daily.     Marland Kitchen amLODipine (NORVASC) 10 MG tablet Take 10 mg by mouth daily.    Marland Kitchen aspirin 81 MG tablet Take 81 mg by mouth daily.    . calcitRIOL (ROCALTROL) 0.25 MCG capsule Take 0.25 mcg by mouth daily.    . Calcium Carbonate Antacid (TUMS E-X PO) Take by mouth.    . carvedilol (  COREG) 25 MG tablet Take 25 mg by mouth 2 (two) times daily with a meal.    . furosemide (LASIX) 40 MG tablet Take 80 mg by mouth daily.     . insulin degludec (TRESIBA FLEXTOUCH) 100 UNIT/ML SOPN FlexTouch Pen Inject 0.07 mLs (7 Units total) into the skin daily. 5 pen 11  . Iron-FA-B Cmp-C-Biot-Probiotic (FUSION PLUS PO) Take by mouth.    Marland Kitchen omeprazole (PRILOSEC) 20 MG capsule Take 1 capsule (20 mg total) by mouth daily. 30 capsule 0  . rosuvastatin (CRESTOR) 10 MG tablet Take 10 mg by mouth every Monday, Wednesday, and Friday.    . insulin aspart (NOVOLOG) 100 UNIT/ML FlexPen Inject 7 Units into the skin 3 (three) times daily with meals. (Patient not taking: Reported on 10/10/2018) 15 mL 11  . meclizine (ANTIVERT) 12.5 MG tablet Take 1 tablet (12.5 mg total) by mouth 3 (three) times daily as needed for dizziness. (Patient not taking: Reported on 10/10/2018) 30 tablet 0   No current facility-administered medications on  file prior to visit.     Allergies  Allergen Reactions  . Penicillins Rash    Family History  Problem Relation Age of Onset  . Diabetes Mother     BP (!) 146/70 (BP Location: Right Arm, Patient Position: Sitting, Cuff Size: Large)   Pulse 81   Ht 5' (1.524 m)   Wt 213 lb (96.6 kg)   SpO2 97%   BMI 41.60 kg/m    Review of Systems Denies LOC    Objective:   Physical Exam VITAL SIGNS:  See vs page GENERAL: no distress Pulses: dorsalis pedis intact bilat.   MSK: no deformity of the feet CV: 1+ bilat leg edema.   Skin:  no ulcer on the feet.  normal color and temp on the feet. Neuro: sensation is intact to touch on the feet Ext: There is bilateral onychomycosis of the toenails. The left great toe is ingrown   Lab Results  Component Value Date   HGBA1C 7.7 (A) 10/10/2018       Assessment & Plan:  Insulin-requiring type 2 DM, with ESRD, therapy limited by hypoglycemia Ingrown nail: worse  Patient Instructions  Your blood pressure is high today.  Please see your primary care provider soon, to have it rechecked check your blood sugar twice a day.  vary the time of day when you check, between before the 3 meals, and at bedtime.  also check if you have symptoms of your blood sugar being too high or too low.  please keep a record of the readings and bring it to your next appointment here (or you can bring the meter itself).  You can write it on any piece of paper.  please call us sooner if your blood sugar goes below 70, or if you have a lot of readings over 200. Please reduce the humalog to 3 times a day (just before each meal) 8-7-8 units, and: Please continue the same tresiba Please see a foot specialist.  you will receive a phone call, about a day and time for an appointment.   Please come back for a follow-up appointment in 3 months.

## 2018-10-21 DIAGNOSIS — N2581 Secondary hyperparathyroidism of renal origin: Secondary | ICD-10-CM | POA: Insufficient documentation

## 2018-10-21 DIAGNOSIS — I770 Arteriovenous fistula, acquired: Secondary | ICD-10-CM | POA: Insufficient documentation

## 2018-10-21 DIAGNOSIS — E1151 Type 2 diabetes mellitus with diabetic peripheral angiopathy without gangrene: Secondary | ICD-10-CM | POA: Insufficient documentation

## 2018-10-21 DIAGNOSIS — D688 Other specified coagulation defects: Secondary | ICD-10-CM | POA: Insufficient documentation

## 2018-10-21 DIAGNOSIS — R509 Fever, unspecified: Secondary | ICD-10-CM | POA: Insufficient documentation

## 2018-10-21 DIAGNOSIS — L299 Pruritus, unspecified: Secondary | ICD-10-CM | POA: Insufficient documentation

## 2018-10-21 DIAGNOSIS — R0602 Shortness of breath: Secondary | ICD-10-CM | POA: Insufficient documentation

## 2018-10-25 DIAGNOSIS — E8770 Fluid overload, unspecified: Secondary | ICD-10-CM | POA: Insufficient documentation

## 2018-10-28 ENCOUNTER — Ambulatory Visit: Payer: Medicare Other | Admitting: Podiatry

## 2018-10-28 DIAGNOSIS — N185 Chronic kidney disease, stage 5: Secondary | ICD-10-CM | POA: Insufficient documentation

## 2018-10-28 DIAGNOSIS — E785 Hyperlipidemia, unspecified: Secondary | ICD-10-CM | POA: Insufficient documentation

## 2018-10-28 DIAGNOSIS — M79674 Pain in right toe(s): Secondary | ICD-10-CM

## 2018-10-28 DIAGNOSIS — E213 Hyperparathyroidism, unspecified: Secondary | ICD-10-CM | POA: Insufficient documentation

## 2018-10-28 DIAGNOSIS — M79675 Pain in left toe(s): Secondary | ICD-10-CM

## 2018-10-28 DIAGNOSIS — E1121 Type 2 diabetes mellitus with diabetic nephropathy: Secondary | ICD-10-CM | POA: Diagnosis not present

## 2018-10-28 DIAGNOSIS — L600 Ingrowing nail: Secondary | ICD-10-CM

## 2018-10-28 DIAGNOSIS — B351 Tinea unguium: Secondary | ICD-10-CM

## 2018-10-28 DIAGNOSIS — Q632 Ectopic kidney: Secondary | ICD-10-CM | POA: Insufficient documentation

## 2018-10-28 DIAGNOSIS — R42 Dizziness and giddiness: Secondary | ICD-10-CM | POA: Insufficient documentation

## 2018-10-28 DIAGNOSIS — M109 Gout, unspecified: Secondary | ICD-10-CM | POA: Insufficient documentation

## 2018-10-28 DIAGNOSIS — R809 Proteinuria, unspecified: Secondary | ICD-10-CM | POA: Insufficient documentation

## 2018-10-28 DIAGNOSIS — Z01818 Encounter for other preprocedural examination: Secondary | ICD-10-CM | POA: Insufficient documentation

## 2018-10-30 ENCOUNTER — Encounter (HOSPITAL_COMMUNITY): Payer: Medicare Other

## 2018-10-30 NOTE — Progress Notes (Signed)
Subjective:   Patient ID: Katie Garcia, female   DOB: 77 y.o.   MRN: [id number]   HPI 77 year old female presents to consider concerns of thick, discolored, elongated toenails that she cannot trim herself as well as for both her big toes becoming ingrown.  She said they are painful with pressure in shoes but she denies any redness or drainage or any swelling.  She is diabetic her last A1c was 7.7.  She has no other concerns today.  No history of ulceration.   Review of Systems  All other systems reviewed and are negative.  Past Medical History:  Diagnosis Date  . Anemia   . Arthritis   . Chronic kidney disease   . Diabetes mellitus without complication ([place])   . GERD (gastroesophageal reflux disease)   . Headache   . Hypertension     Past Surgical History:  Procedure Laterality Date  . ABDOMINAL HYSTERECTOMY    . AV FISTULA PLACEMENT Left 10/02/2016   Procedure: ARTERIOVENOUS (AV) FISTULA CREATION LEFT UPPER ARM;  Surgeon: Waynetta Sandy, MD;  Location: [facility];  Service: Vascular;  Laterality: Left;  . CESAREAN SECTION       Current Outpatient Medications:  .  ACCU-CHEK GUIDE test strip, USE TO CHECK BG UP TO 4 TIMES A DAY (DX: E11.22 - DIABETES WITH CKD AND INSULIN THERAPY) IN VITRO, Disp: , Rfl: 1 .  allopurinol (ZYLOPRIM) 100 MG tablet, Take 200 mg by mouth daily. , Disp: , Rfl:  .  amLODipine (NORVASC) 10 MG tablet, Take 10 mg by mouth daily., Disp: , Rfl:  .  aspirin 81 MG tablet, Take 81 mg by mouth daily., Disp: , Rfl:  .  B-D ULTRAFINE III SHORT PEN 31G X 8 MM MISC, USE TO INJECT INSULIN 4 TIMES DAILY, Disp: , Rfl: 11 .  calcitRIOL (ROCALTROL) 0.25 MCG capsule, Take 0.25 mcg by mouth daily., Disp: , Rfl:  .  calcium acetate (PHOSLO) 667 MG capsule, TAKE 1 CAPSULE BY MOUTH THREE TIMES A DAY WITH MEALS TAKE 1 WITH EVERY MEAL, Disp: , Rfl: 3 .  Calcium Carbonate Antacid (TUMS E-X PO), Take by mouth., Disp: , Rfl:  .  carvedilol (COREG) 25 MG tablet, Take 25 mg by  mouth 2 (two) times daily with a meal., Disp: , Rfl:  .  furosemide (LASIX) 40 MG tablet, Take 80 mg by mouth daily. , Disp: , Rfl:  .  insulin aspart (NOVOLOG) 100 UNIT/ML FlexPen, Inject 7 Units into the skin 3 (three) times daily with meals. (Patient not taking: Reported on 10/10/2018), Disp: 15 mL, Rfl: 11 .  insulin degludec (TRESIBA FLEXTOUCH) 100 UNIT/ML SOPN FlexTouch Pen, Inject 0.07 mLs (7 Units total) into the skin daily., Disp: 5 pen, Rfl: 11 .  insulin lispro (HUMALOG KWIKPEN) 100 UNIT/ML KiwkPen, 3 times a day (just before each meal) 8-7-8 units, and pen needles 4/day., Disp: 5 pen, Rfl: 11 .  Iron-FA-B Cmp-C-Biot-Probiotic (FUSION PLUS PO), Take by mouth., Disp: , Rfl:  .  meclizine (ANTIVERT) 12.5 MG tablet, Take 1 tablet (12.5 mg total) by mouth 3 (three) times daily as needed for dizziness. (Patient not taking: Reported on 10/10/2018), Disp: 30 tablet, Rfl: 0 .  omeprazole (PRILOSEC) 20 MG capsule, Take 1 capsule (20 mg total) by mouth daily., Disp: 30 capsule, Rfl: 0 .  rosuvastatin (CRESTOR) 10 MG tablet, Take 10 mg by mouth every Monday, Wednesday, and Friday., Disp: , Rfl:   Allergies  Allergen Reactions  . Penicillins Rash  Objective:  Physical Exam  General: AAO x3, NAD  Dermatological: Nails are hypertrophic, dystrophic, brittle, discolored, elongated 10.  Incurvation present to both medial lateral corners of bilateral hallux toenails without any signs of infection.  No surrounding redness or drainage. Tenderness nails 1-5 bilaterally. No open lesions or pre-ulcerative lesions are identified today.  Vascular: DP pulses 2/4, PT pulse 1/4, CRT less than 3 seconds.  Chronic edema bilaterally present.  Neruologic: Grossly intact via light touch bilateral. Protective threshold with Semmes Wienstein monofilament intact to all pedal sites bilateral.  Musculoskeletal: No gross boney pedal deformities bilateral. No pain, crepitus, or limitation noted with foot and  ankle range of motion bilateral. Muscular strength 5/5 in all groups tested bilateral.    Assessment:   77 year old female with symptomatic onychomycosis, ingrown toenail     Plan:   -Treatment options discussed including all alternatives, risks, and complications -Etiology of symptoms were discussed -Nails debrided 10 without complications or bleeding.  Discussed partial nail avulsions if symptoms continue but there is no signs of infection there is resolution of pain after debridement.  Continue to monitor her right brain signs or symptoms of infection. -Daily foot inspection -Follow-up in 3 months or sooner if any problems arise. In the meantime, encouraged to call the office with any questions, concerns, change in symptoms.   Celesta Gentile, DPM

## 2018-11-28 ENCOUNTER — Telehealth: Payer: Self-pay | Admitting: Endocrinology

## 2018-11-28 DIAGNOSIS — E441 Mild protein-calorie malnutrition: Secondary | ICD-10-CM | POA: Insufficient documentation

## 2018-11-28 NOTE — Telephone Encounter (Signed)
Per [facility] "Caller states that [facility] and she saw a mutual PT Kalysta Sitter DOB [id number] that has started dialysis and her diet was changed quite a bit and due to that she has skipped her insulin dosage b/c she was afraid to go to low. PT needs an insulin adjustment. Call back # [facility] [phone]"

## 2018-11-28 NOTE — Telephone Encounter (Signed)
Called Tammy back at number listed below and requested a call back.

## 2018-11-29 NOTE — Telephone Encounter (Signed)
Please move up next appt to next week

## 2018-11-29 NOTE — Telephone Encounter (Signed)
Can you please advise? Pt has been skipping meal time insulin. Checking BID

## 2018-11-29 NOTE — Telephone Encounter (Signed)
Scheduled 12/03/18 @2 

## 2018-12-03 ENCOUNTER — Ambulatory Visit (INDEPENDENT_AMBULATORY_CARE_PROVIDER_SITE_OTHER): Payer: Medicare Other | Admitting: Endocrinology

## 2018-12-03 ENCOUNTER — Encounter: Payer: Self-pay | Admitting: Endocrinology

## 2018-12-03 VITALS — BP 156/72 | HR 81 | Ht 60.0 in | Wt 204.2 lb

## 2018-12-03 DIAGNOSIS — E1365 Other specified diabetes mellitus with hyperglycemia: Secondary | ICD-10-CM | POA: Diagnosis not present

## 2018-12-03 DIAGNOSIS — IMO0002 Reserved for concepts with insufficient information to code with codable children: Secondary | ICD-10-CM

## 2018-12-03 LAB — POCT GLYCOSYLATED HEMOGLOBIN (HGB A1C): Hemoglobin A1C: 7.1 % — AB (ref 4.0–5.6)

## 2018-12-03 MED ORDER — INSULIN DEGLUDEC 100 UNIT/ML ~~LOC~~ SOPN: 6.0000 [IU] | PEN_INJECTOR | Freq: Every day | SUBCUTANEOUS | 11 refills | Status: DC

## 2018-12-03 NOTE — Patient Instructions (Addendum)
check your blood sugar twice a day.  vary the time of day when you check, between before the 3 meals, and at bedtime.  also check if you have symptoms of your blood sugar being too high or too low.  please keep a record of the readings and bring it to your next appointment here (or you can bring the meter itself).  You can write it on any piece of paper.  please call us sooner if your blood sugar goes below 70, or if you have a lot of readings over 200. Please continue the same tresiba to 6 units per day, and: Please continue the same humalog.  Please come back for a follow-up appointment in 3 months.

## 2018-12-03 NOTE — Progress Notes (Signed)
Subjective:    Patient ID: Katie Garcia, female    DOB: 03/26/1941, 77 y.o.   MRN: [id number]  HPI Pt returns for f/u of diabetes mellitus:  DM type: Insulin-requiring type 2 Dx'ed: 3614 Complications: polyneuropathy, DR, and ESRD Therapy: insulin since 2004.   GDM: 1970 DKA: never.   Severe hypoglycemia: last episode was 2017.   Pancreatitis: never.   Pancreatic imaging: never.  Other: she takes multiple daily injections. Interval history: Meter is downloaded today, and the printout is scanned into the record.  cbg varies from 65-340.  There is no trend throughout the day. pt states she feels well in general.   Past Medical History:  Diagnosis Date  . Anemia   . Arthritis   . Chronic kidney disease   . Diabetes mellitus without complication ([place])   . GERD (gastroesophageal reflux disease)   . Headache   . Hypertension     Past Surgical History:  Procedure Laterality Date  . ABDOMINAL HYSTERECTOMY    . AV FISTULA PLACEMENT Left 10/02/2016   Procedure: ARTERIOVENOUS (AV) FISTULA CREATION LEFT UPPER ARM;  Surgeon: Waynetta Sandy, MD;  Location: [facility];  Service: Vascular;  Laterality: Left;  . CESAREAN SECTION      Social History   Socioeconomic History  . Marital status: Married    Spouse name: Not on file  . Number of children: Not on file  . Years of education: Not on file  . Highest education level: Not on file  Occupational History  . Not on file  Social Needs  . Financial resource strain: Not on file  . Food insecurity:    Worry: Not on file    Inability: Not on file  . Transportation needs:    Medical: Not on file    Non-medical: Not on file  Tobacco Use  . Smoking status: Never Smoker  . Smokeless tobacco: Never Used  Substance and Sexual Activity  . Alcohol use: No  . Drug use: No  . Sexual activity: Not on file  Lifestyle  . Physical activity:    Days per week: Not on file    Minutes per session: Not on file  . Stress: Not on file    Relationships  . Social connections:    Talks on phone: Not on file    Gets together: Not on file    Attends religious service: Not on file    Active member of club or organization: Not on file    Attends meetings of clubs or organizations: Not on file    Relationship status: Not on file  . Intimate partner violence:    Fear of current or ex partner: Not on file    Emotionally abused: Not on file    Physically abused: Not on file    Forced sexual activity: Not on file  Other Topics Concern  . Not on file  Social History Narrative  . Not on file    Current Outpatient Medications on File Prior to Visit  Medication Sig Dispense Refill  . ACCU-CHEK GUIDE test strip USE TO CHECK BG UP TO 4 TIMES A DAY (DX: E11.22 - DIABETES WITH CKD AND INSULIN THERAPY) IN VITRO  1  . allopurinol (ZYLOPRIM) 100 MG tablet Take 200 mg by mouth daily.     Marland Kitchen amLODipine (NORVASC) 10 MG tablet Take 10 mg by mouth daily.    Marland Kitchen aspirin 81 MG tablet Take 81 mg by mouth daily.    . B Complex-C-Zn-Folic  Acid (DIALYVITE/ZINC) TABS every evening.  3  . B-D ULTRAFINE III SHORT PEN 31G X 8 MM MISC USE TO INJECT INSULIN 4 TIMES DAILY  11  . calcium acetate (PHOSLO) 667 MG capsule TAKE 1 CAPSULE BY MOUTH THREE TIMES A DAY WITH MEALS TAKE 1 WITH EVERY MEAL  3  . carvedilol (COREG) 25 MG tablet Take 25 mg by mouth 2 (two) times daily with a meal.    . insulin lispro (HUMALOG KWIKPEN) 100 UNIT/ML KiwkPen 3 times a day (just before each meal) 8-7-8 units, and pen needles 4/day. 5 pen 11  . Iron-FA-B Cmp-C-Biot-Probiotic (FUSION PLUS PO) Take by mouth.    . meclizine (ANTIVERT) 12.5 MG tablet Take 1 tablet (12.5 mg total) by mouth 3 (three) times daily as needed for dizziness. 30 tablet 0  . omeprazole (PRILOSEC) 20 MG capsule Take 1 capsule (20 mg total) by mouth daily. 30 capsule 0  . rosuvastatin (CRESTOR) 10 MG tablet Take 10 mg by mouth every Monday, Wednesday, and Friday.    . calcitRIOL (ROCALTROL) 0.25 MCG capsule  Take 0.25 mcg by mouth daily.    . Calcium Carbonate Antacid (TUMS E-X PO) Take by mouth.    . furosemide (LASIX) 40 MG tablet Take 80 mg by mouth daily.      No current facility-administered medications on file prior to visit.     Allergies  Allergen Reactions  . Penicillins Rash    Family History  Problem Relation Age of Onset  . Diabetes Mother     BP (!) 156/72 (BP Location: Right Arm, Patient Position: Sitting, Cuff Size: Normal)   Pulse 81   Ht 5' (1.524 m)   Wt 204 lb 3.2 oz (92.6 kg)   SpO2 98%   BMI 39.88 kg/m   Review of Systems Denies LOC    Objective:   Physical Exam VITAL SIGNS:  See vs page GENERAL: no distress Pulses: dorsalis pedis intact bilat.   MSK: no deformity of the feet CV: 1+ bilat leg edema.   Skin:  no ulcer on the feet.  normal color and temp on the feet. Neuro: sensation is intact to touch on the feet Ext: There is bilateral onychomycosis of the toenails.    Lab Results  Component Value Date   HGBA1C 7.1 (A) 12/03/2018       Assessment & Plan:  HTN: she has HD tomorrow.   Insulin-requiring type 2 DM, with DR.  ESRD: a1c overestimates glycemic control, but we still need to decrease insulin.   Hypoglycemia: due to insulin.    Patient Instructions  check your blood sugar twice a day.  vary the time of day when you check, between before the 3 meals, and at bedtime.  also check if you have symptoms of your blood sugar being too high or too low.  please keep a record of the readings and bring it to your next appointment here (or you can bring the meter itself).  You can write it on any piece of paper.  please call us sooner if your blood sugar goes below 70, or if you have a lot of readings over 200. Please continue the same tresiba to 6 units per day, and: Please continue the same humalog.  Please come back for a follow-up appointment in 3 months.

## 2019-01-13 ENCOUNTER — Ambulatory Visit: Payer: Medicare Other | Admitting: Endocrinology

## 2019-01-29 ENCOUNTER — Ambulatory Visit: Payer: Medicare Other | Admitting: Podiatry

## 2019-01-29 DIAGNOSIS — L84 Corns and callosities: Secondary | ICD-10-CM | POA: Diagnosis not present

## 2019-01-29 DIAGNOSIS — M79675 Pain in left toe(s): Secondary | ICD-10-CM

## 2019-01-29 DIAGNOSIS — B351 Tinea unguium: Secondary | ICD-10-CM

## 2019-01-29 DIAGNOSIS — M79674 Pain in right toe(s): Secondary | ICD-10-CM

## 2019-01-29 DIAGNOSIS — E1122 Type 2 diabetes mellitus with diabetic chronic kidney disease: Secondary | ICD-10-CM

## 2019-01-29 DIAGNOSIS — N186 End stage renal disease: Secondary | ICD-10-CM | POA: Diagnosis not present

## 2019-01-29 NOTE — Patient Instructions (Addendum)
Onychomycosis/Fungal Toenails  WHAT IS IT? An infection that lies within the keratin of your nail plate that is caused by a fungus.  WHY ME? Fungal infections affect all ages, sexes, races, and creeds.  There may be many factors that predispose you to a fungal infection such as age, coexisting medical conditions such as diabetes, or an autoimmune disease; stress, medications, fatigue, genetics, etc.  Bottom line: fungus thrives in a warm, moist environment and your shoes offer such a location.  IS IT CONTAGIOUS? Theoretically, yes.  You do not want to share shoes, nail clippers or files with someone who has fungal toenails.  Walking around barefoot in the same room or sleeping in the same bed is unlikely to transfer the organism.  It is important to realize, however, that fungus can spread easily from one nail to the next on the same foot.  HOW DO WE TREAT THIS?  There are several ways to treat this condition.  Treatment may depend on many factors such as age, medications, pregnancy, liver and kidney conditions, etc.  It is best to ask your doctor which options are available to you.  1. No treatment.   Unlike many other medical concerns, you can live with this condition.  However for many people this can be a painful condition and may lead to ingrown toenails or a bacterial infection.  It is recommended that you keep the nails cut short to help reduce the amount of fungal nail. 2. Topical treatment.  These range from herbal remedies to prescription strength nail lacquers.  About 40-50% effective, topicals require twice daily application for approximately 9 to 12 months or until an entirely new nail has grown out.  The most effective topicals are medical grade medications available through physicians offices. 3. Oral antifungal medications.  With an 80-90% cure rate, the most common oral medication requires 3 to 4 months of therapy and stays in your system for a year as the new nail grows out.  Oral  antifungal medications do require blood work to make sure it is a safe drug for you.  A liver function panel will be performed prior to starting the medication and after the first month of treatment.  It is important to have the blood work performed to avoid any harmful side effects.  In general, this medication safe but blood work is required. 4. Laser Therapy.  This treatment is performed by applying a specialized laser to the affected nail plate.  This therapy is noninvasive, fast, and non-painful.  It is not covered by insurance and is therefore, out of pocket.  The results have been very good with a 80-95% cure rate.  [facility] is the only practice in the area to offer this therapy. 5. Permanent Nail Avulsion.  Removing the entire nail so that a new nail will not grow back. Diabetes Mellitus and Foot Care Foot care is an important part of your health, especially when you have diabetes. Diabetes may cause you to have problems because of poor blood flow (circulation) to your feet and legs, which can cause your skin to:  Become thinner and drier.  Break more easily.  Heal more slowly.  Peel and crack. You may also have nerve damage (neuropathy) in your legs and feet, causing decreased feeling in them. This means that you may not notice minor injuries to your feet that could lead to more serious problems. Noticing and addressing any potential problems early is the best way to prevent future   foot problems. How to care for your feet Foot hygiene  Wash your feet daily with warm water and mild soap. Do not use hot water. Then, pat your feet and the areas between your toes until they are completely dry. Do not soak your feet as this can dry your skin.  Trim your toenails straight across. Do not dig under them or around the cuticle. File the edges of your nails with an emery board or nail file.  Apply a moisturizing lotion or petroleum jelly to the skin on your feet and to dry, brittle  toenails. Use lotion that does not contain alcohol and is unscented. Do not apply lotion between your toes. Shoes and socks  Wear clean socks or stockings every day. Make sure they are not too tight. Do not wear knee-high stockings since they may decrease blood flow to your legs.  Wear shoes that fit properly and have enough cushioning. Always look in your shoes before you put them on to be sure there are no objects inside.  To break in new shoes, wear them for just a few hours a day. This prevents injuries on your feet. Wounds, scrapes, corns, and calluses  Check your feet daily for blisters, cuts, bruises, sores, and redness. If you cannot see the bottom of your feet, use a mirror or ask someone for help.  Do not cut corns or calluses or try to remove them with medicine.  If you find a minor scrape, cut, or break in the skin on your feet, keep it and the skin around it clean and dry. You may clean these areas with mild soap and water. Do not clean the area with peroxide, alcohol, or iodine.  If you have a wound, scrape, corn, or callus on your foot, look at it several times a day to make sure it is healing and not infected. Check for: ? Redness, swelling, or pain. ? Fluid or blood. ? Warmth. ? Pus or a bad smell. General instructions  Do not cross your legs. This may decrease blood flow to your feet.  Do not use heating pads or hot water bottles on your feet. They may burn your skin. If you have lost feeling in your feet or legs, you may not know this is happening until it is too late.  Protect your feet from hot and cold by wearing shoes, such as at the beach or on hot pavement.  Schedule a complete foot exam at least once a year (annually) or more often if you have foot problems. If you have foot problems, report any cuts, sores, or bruises to your health care provider immediately. Contact a health care provider if:  You have a medical condition that increases your risk of  infection and you have any cuts, sores, or bruises on your feet.  You have an injury that is not healing.  You have redness on your legs or feet.  You feel burning or tingling in your legs or feet.  You have pain or cramps in your legs and feet.  Your legs or feet are numb.  Your feet always feel cold.  You have pain around a toenail. Get help right away if:  You have a wound, scrape, corn, or callus on your foot and: ? You have pain, swelling, or redness that gets worse. ? You have fluid or blood coming from the wound, scrape, corn, or callus. ? Your wound, scrape, corn, or callus feels warm to the touch. ? You have   pus or a bad smell coming from the wound, scrape, corn, or callus. ? You have a fever. ? You have a red line going up your leg. Summary  Check your feet every day for cuts, sores, red spots, swelling, and blisters.  Moisturize feet and legs daily.  Wear shoes that fit properly and have enough cushioning.  If you have foot problems, report any cuts, sores, or bruises to your health care provider immediately.  Schedule a complete foot exam at least once a year (annually) or more often if you have foot problems. This information is not intended to replace advice given to you by your health care provider. Make sure you discuss any questions you have with your health care provider. Document Released: 12/08/2000 Document Revised: 01/23/2018 Document Reviewed: 01/12/2017 Elsevier Interactive Patient Education  2019 [employer]  Corns and Calluses Corns are small areas of thickened skin that occur on the top, sides, or tip of a toe. They contain a cone-shaped core with a point that can press on a nerve below. This causes pain.  Calluses are areas of thickened skin that can occur anywhere on the body, including the hands, fingers, palms, soles of the feet, and heels. Calluses are usually larger than corns. What are the causes? Corns and calluses are caused by rubbing  (friction) or pressure, such as from shoes that are too tight or do not fit properly. What increases the risk? Corns are more likely to develop in people who have misshapen toes (toe deformities), such as hammer toes. Calluses can occur with friction to any area of the skin. They are more likely to develop in people who:  Work with their hands.  Wear shoes that fit poorly, are too tight, or are high-heeled.  Have toe deformities. What are the signs or symptoms? Symptoms of a corn or callus include:  A hard growth on the skin.  Pain or tenderness under the skin.  Redness and swelling.  Increased discomfort while wearing tight-fitting shoes, if your feet are affected. If a corn or callus becomes infected, symptoms may include:  Redness and swelling that gets worse.  Pain.  Fluid, blood, or pus draining from the corn or callus. How is this diagnosed? Corns and calluses may be diagnosed based on your symptoms, your medical history, and a physical exam. How is this treated? Treatment for corns and calluses may include:  Removing the cause of the friction or pressure. This may involve: ? Changing your shoes. ? Wearing shoe inserts (orthotics) or other protective layers in your shoes, such as a corn pad. ? Wearing gloves.  Applying medicine to the skin (topical medicine) to help soften skin in the hardened, thickened areas.  Removing layers of dead skin with a file to reduce the size of the corn or callus.  Removing the corn or callus with a scalpel or laser.  Taking antibiotic medicines, if your corn or callus is infected.  Having surgery, if a toe deformity is the cause. Follow these instructions at home:   Take over-the-counter and prescription medicines only as told by your health care provider.  If you were prescribed an antibiotic, take it as told by your health care provider. Do not stop taking it even if your condition starts to improve.  Wear shoes that fit  well. Avoid wearing high-heeled shoes and shoes that are too tight or too loose.  Wear any padding, protective layers, gloves, or orthotics as told by your health care provider.  Soak your   hands or feet and then use a file or pumice stone to soften your corn or callus. Do this as told by your health care provider.  Check your corn or callus every day for symptoms of infection. Contact a health care provider if you:  Notice that your symptoms do not improve with treatment.  Have redness or swelling that gets worse.  Notice that your corn or callus becomes painful.  Have fluid, blood, or pus coming from your corn or callus.  Have new symptoms. Summary  Corns are small areas of thickened skin that occur on the top, sides, or tip of a toe.  Calluses are areas of thickened skin that can occur anywhere on the body, including the hands, fingers, palms, and soles of the feet. Calluses are usually larger than corns.  Corns and calluses are caused by rubbing (friction) or pressure, such as from shoes that are too tight or do not fit properly.  Treatment may include wearing any padding, protective layers, gloves, or orthotics as told by your health care provider. This information is not intended to replace advice given to you by your health care provider. Make sure you discuss any questions you have with your health care provider. Document Released: 09/16/2004 Document Revised: 10/24/2017 Document Reviewed: 10/24/2017 Elsevier Interactive Patient Education  2019 [employer]  

## 2019-02-02 NOTE — Progress Notes (Addendum)
Subjective: Katie Garcia presents today with painful, thick toenails 1-5 b/l that she cannot cut and which interfere with daily activities.  Most symptomatic are her b/l great toes which are ingrown. She denies any redness, drainage, or swelling.  Pain is aggravated when wearing enclosed shoe gear.  [name], MD is her PCP.    Current Outpatient Medications:  .  ACCU-CHEK GUIDE test strip, USE TO CHECK BG UP TO 4 TIMES A DAY (DX: E11.22 - DIABETES WITH CKD AND INSULIN THERAPY) IN VITRO, Disp: , Rfl: 1 .  allopurinol (ZYLOPRIM) 100 MG tablet, Take 200 mg by mouth daily. , Disp: , Rfl:  .  amLODipine (NORVASC) 10 MG tablet, Take 10 mg by mouth daily., Disp: , Rfl:  .  aspirin 81 MG tablet, Take 81 mg by mouth daily., Disp: , Rfl:  .  B Complex-C-Zn-Folic Acid (DIALYVITE/ZINC) TABS, every evening., Disp: , Rfl: 3 .  B-D ULTRAFINE III SHORT PEN 31G X 8 MM MISC, USE TO INJECT INSULIN 4 TIMES DAILY, Disp: , Rfl: 11 .  calcitRIOL (ROCALTROL) 0.25 MCG capsule, Take 0.25 mcg by mouth daily., Disp: , Rfl:  .  calcium acetate (PHOSLO) 667 MG capsule, TAKE 1 CAPSULE BY MOUTH THREE TIMES A DAY WITH MEALS TAKE 1 WITH EVERY MEAL, Disp: , Rfl: 3 .  Calcium Carbonate Antacid (TUMS E-X PO), Take by mouth., Disp: , Rfl:  .  carvedilol (COREG) 25 MG tablet, Take 25 mg by mouth 2 (two) times daily with a meal., Disp: , Rfl:  .  furosemide (LASIX) 40 MG tablet, Take 80 mg by mouth daily. , Disp: , Rfl:  .  insulin degludec (TRESIBA FLEXTOUCH) 100 UNIT/ML SOPN FlexTouch Pen, Inject 0.06 mLs (6 Units total) into the skin daily., Disp: 5 pen, Rfl: 11 .  insulin lispro (HUMALOG KWIKPEN) 100 UNIT/ML KiwkPen, 3 times a day (just before each meal) 8-7-8 units, and pen needles 4/day., Disp: 5 pen, Rfl: 11 .  Iron-FA-B Cmp-C-Biot-Probiotic (FUSION PLUS PO), Take by mouth., Disp: , Rfl:  .  meclizine (ANTIVERT) 12.5 MG tablet, Take 1 tablet (12.5 mg total) by mouth 3 (three) times daily as needed for  dizziness., Disp: 30 tablet, Rfl: 0 .  omeprazole (PRILOSEC) 20 MG capsule, Take 1 capsule (20 mg total) by mouth daily., Disp: 30 capsule, Rfl: 0 .  rosuvastatin (CRESTOR) 10 MG tablet, Take 10 mg by mouth every Monday, Wednesday, and Friday., Disp: , Rfl:   Allergies  Allergen Reactions  . Penicillins Rash    Objective:  Vascular Examination: Capillary refill time <3 seconds x 10 digits  Dorsalis pedis 2/4 b/l   Posterior tibial pulses 1/4  b/l  Digital hair absent x 10 digits  Skin temperature gradient WNL b/l  Edema b/l LE with dependent rubor.  Dermatological Examination: Skin with normal turgor, texture and tone b/l  Toenails 1-5 b/l discolored, thick, dystrophic with subungual debris and pain with palpation to nailbeds due to thickness of nails.  Hyperkeratotic lesion submet head 5 b/l with tenderness to palpation. No erythema, no edema, no drainage, no flocculence.  Musculoskeletal: Muscle strength 5/5 to all LE muscle groups  No gross bony deformities b/l.  No pain, crepitus or joint limitation noted with ROM.   Neurological: Sensation intact with 10 gram monofilament.  Vibratory sensation intact.  Assessment: Painful onychomycosis toenails 1-5 b/l  Callus submet head 5 b/l NIDDM with  ESRD  Plan: 1. Toenails 1-5 b/l were debrided in length and girth without iatrogenic bleeding.  Offending nail borders debrided and curretaged without incident. Borders cleansed with alcohol. 2. Calluses pared submet head 5 b/l without incident. 3. Patient to continue soft, supportive shoe gear 4. Patient to report any pedal injuries to medical professional immediately. 5. Follow up 3 months.  6. Patient/POA to call should there be a concern in the interim.

## 2019-02-03 ENCOUNTER — Encounter: Payer: Self-pay | Admitting: Podiatry

## 2019-02-11 DIAGNOSIS — I10 Essential (primary) hypertension: Secondary | ICD-10-CM | POA: Insufficient documentation

## 2019-03-05 ENCOUNTER — Ambulatory Visit (INDEPENDENT_AMBULATORY_CARE_PROVIDER_SITE_OTHER): Payer: Medicare Other | Admitting: Endocrinology

## 2019-03-05 ENCOUNTER — Encounter: Payer: Self-pay | Admitting: Endocrinology

## 2019-03-05 ENCOUNTER — Other Ambulatory Visit: Payer: Self-pay

## 2019-03-05 VITALS — BP 160/60 | HR 87 | Ht 60.0 in | Wt 189.6 lb

## 2019-03-05 DIAGNOSIS — IMO0002 Reserved for concepts with insufficient information to code with codable children: Secondary | ICD-10-CM

## 2019-03-05 DIAGNOSIS — E1365 Other specified diabetes mellitus with hyperglycemia: Secondary | ICD-10-CM

## 2019-03-05 LAB — POCT GLYCOSYLATED HEMOGLOBIN (HGB A1C): Hemoglobin A1C: 8.6 % — AB (ref 4.0–5.6)

## 2019-03-05 MED ORDER — INSULIN NPH (HUMAN) (ISOPHANE) 100 UNIT/ML ~~LOC~~ SUSP: 28.0000 [IU] | SUBCUTANEOUS | 11 refills | Status: DC

## 2019-03-05 NOTE — Progress Notes (Signed)
Subjective:    Patient ID: Katie Garcia, female    DOB: [birth year], 78 y.o.   MRN: [id number]  HPI Pt returns for f/u of diabetes mellitus:  DM type: Insulin-requiring type 2 Dx'ed: 0865 Complications: polyneuropathy, DR, and ESRD Therapy: insulin since 2004.   GDM: 1970 DKA: never.   Severe hypoglycemia: last episode was 2017.   Pancreatitis: never.   Pancreatic imaging: never.  Other: she takes multiple daily injections.   Interval history: Pt says she sometimes misses the insulin, due to husband's recent death.  she says she can no longer afford name brand insulin.  No new sxs.   Past Medical History:  Diagnosis Date  . Anemia   . Arthritis   . Chronic kidney disease   . Diabetes mellitus without complication ([place])   . GERD (gastroesophageal reflux disease)   . Headache   . Hypertension     Past Surgical History:  Procedure Laterality Date  . ABDOMINAL HYSTERECTOMY    . AV FISTULA PLACEMENT Left 10/02/2016   Procedure: ARTERIOVENOUS (AV) FISTULA CREATION LEFT UPPER ARM;  Surgeon: Waynetta Sandy, MD;  Location: [facility];  Service: Vascular;  Laterality: Left;  . CESAREAN SECTION      Social History   Socioeconomic History  . Marital status: Married    Spouse name: Not on file  . Number of children: Not on file  . Years of education: Not on file  . Highest education level: Not on file  Occupational History  . Not on file  Social Needs  . Financial resource strain: Not on file  . Food insecurity:    Worry: Not on file    Inability: Not on file  . Transportation needs:    Medical: Not on file    Non-medical: Not on file  Tobacco Use  . Smoking status: Never Smoker  . Smokeless tobacco: Never Used  Substance and Sexual Activity  . Alcohol use: No  . Drug use: No  . Sexual activity: Not on file  Lifestyle  . Physical activity:    Days per week: Not on file    Minutes per session: Not on file  . Stress: Not on file  Relationships  . Social  connections:    Talks on phone: Not on file    Gets together: Not on file    Attends religious service: Not on file    Active member of club or organization: Not on file    Attends meetings of clubs or organizations: Not on file    Relationship status: Not on file  . Intimate partner violence:    Fear of current or ex partner: Not on file    Emotionally abused: Not on file    Physically abused: Not on file    Forced sexual activity: Not on file  Other Topics Concern  . Not on file  Social History Narrative  . Not on file    Current Outpatient Medications on File Prior to Visit  Medication Sig Dispense Refill  . ACCU-CHEK GUIDE test strip USE TO CHECK BG UP TO 4 TIMES A DAY (DX: E11.22 - DIABETES WITH CKD AND INSULIN THERAPY) IN VITRO  1  . allopurinol (ZYLOPRIM) 100 MG tablet Take 200 mg by mouth daily.     Marland Kitchen amLODipine (NORVASC) 10 MG tablet Take 10 mg by mouth daily.    Marland Kitchen aspirin 81 MG tablet Take 81 mg by mouth daily.    . B Complex-C-Zn-Folic Acid (DIALYVITE/ZINC) TABS every  evening.  3  . B-D ULTRAFINE III SHORT PEN 31G X 8 MM MISC USE TO INJECT INSULIN 4 TIMES DAILY  11  . calcitRIOL (ROCALTROL) 0.25 MCG capsule Take 0.25 mcg by mouth daily.    . calcium acetate (PHOSLO) 667 MG capsule TAKE 1 CAPSULE BY MOUTH THREE TIMES A DAY WITH MEALS TAKE 1 WITH EVERY MEAL  3  . Calcium Carbonate Antacid (TUMS E-X PO) Take by mouth.    . carvedilol (COREG) 25 MG tablet Take 25 mg by mouth 2 (two) times daily with a meal.    . furosemide (LASIX) 40 MG tablet Take 80 mg by mouth daily.     . Iron-FA-B Cmp-C-Biot-Probiotic (FUSION PLUS PO) Take by mouth.    . meclizine (ANTIVERT) 12.5 MG tablet Take 1 tablet (12.5 mg total) by mouth 3 (three) times daily as needed for dizziness. 30 tablet 0  . omeprazole (PRILOSEC) 20 MG capsule Take 1 capsule (20 mg total) by mouth daily. 30 capsule 0  . rosuvastatin (CRESTOR) 10 MG tablet Take 10 mg by mouth every Monday, Wednesday, and Friday.     No  current facility-administered medications on file prior to visit.     Allergies  Allergen Reactions  . Penicillins Rash    Family History  Problem Relation Age of Onset  . Diabetes Mother     BP (!) 160/60 Comment: took antihypertensive today  Pulse 87   Ht 5' (1.524 m)   Wt 189 lb 9.6 oz (86 kg)   SpO2 95%   BMI 37.03 kg/m   Review of Systems She has lost weight since last ov    Objective:   Physical Exam VITAL SIGNS:  See vs page GENERAL: no distress Pulses: dorsalis pedis intact bilat.   MSK: no deformity of the feet CV: trace bilat leg edema.   Skin:  no ulcer on the feet.  normal color and temp on the feet. Neuro: sensation is intact to touch on the feet Ext: There is bilateral onychomycosis of the toenails.    Lab Results  Component Value Date   HGBA1C 8.6 (A) 03/05/2019   Lab Results  Component Value Date   CREATININE 7.43 (H) 10/09/2018   BUN 78 (H) 10/09/2018   NA 139 10/09/2018   K 4.7 10/09/2018   CL 108 10/09/2018   CO2 20 (L) 10/09/2018       Assessment & Plan:  Insulin-requiring type 2 DM: worse.  Pt says dtr can draw up insulin.  Because she cannot draw up her own insulin, she is not a candidate for multiple daily injections.  Renal failure: in this setting, NPH insulin is preferred.   HTN: recheck at HD tomorrow.    Patient Instructions  check your blood sugar twice a day.  vary the time of day when you check, between before the 3 meals, and at bedtime.  also check if you have symptoms of your blood sugar being too high or too low.  please keep a record of the readings and bring it to your next appointment here (or you can bring the meter itself).  You can write it on any piece of paper.  please call us sooner if your blood sugar goes below 70, or if you have a lot of readings over 200. Please change both insulins to "NPH," 28 units each morning. On this type of insulin schedule, you should eat meals on a regular schedule.  If a meal is  missed or significantly delayed, your  blood sugar could go low.  Please come back for a follow-up appointment in 2 weeks.

## 2019-03-05 NOTE — Patient Instructions (Addendum)
check your blood sugar twice a day.  vary the time of day when you check, between before the 3 meals, and at bedtime.  also check if you have symptoms of your blood sugar being too high or too low.  please keep a record of the readings and bring it to your next appointment here (or you can bring the meter itself).  You can write it on any piece of paper.  please call us sooner if your blood sugar goes below 70, or if you have a lot of readings over 200. Please change both insulins to "NPH," 28 units each morning. On this type of insulin schedule, you should eat meals on a regular schedule.  If a meal is missed or significantly delayed, your blood sugar could go low.  Please come back for a follow-up appointment in 2 weeks.

## 2019-03-18 ENCOUNTER — Other Ambulatory Visit: Payer: Self-pay

## 2019-03-19 ENCOUNTER — Other Ambulatory Visit: Payer: Self-pay

## 2019-03-19 ENCOUNTER — Encounter: Payer: Self-pay | Admitting: Endocrinology

## 2019-03-19 ENCOUNTER — Ambulatory Visit (INDEPENDENT_AMBULATORY_CARE_PROVIDER_SITE_OTHER): Payer: Medicare Other | Admitting: Endocrinology

## 2019-03-19 VITALS — BP 160/82 | HR 76 | Temp 98.7°F | Ht 60.0 in | Wt 191.0 lb

## 2019-03-19 DIAGNOSIS — IMO0002 Reserved for concepts with insufficient information to code with codable children: Secondary | ICD-10-CM

## 2019-03-19 DIAGNOSIS — E1365 Other specified diabetes mellitus with hyperglycemia: Secondary | ICD-10-CM | POA: Diagnosis not present

## 2019-03-19 MED ORDER — INSULIN NPH ISOPHANE & REGULAR (70-30) 100 UNIT/ML ~~LOC~~ SUSP: 28.0000 [IU] | Freq: Every day | SUBCUTANEOUS | 11 refills | Status: DC

## 2019-03-19 NOTE — Progress Notes (Signed)
Subjective:    Patient ID: Katie Garcia, female    DOB: 1941/09/06, 78 y.o.   MRN: [id number]  HPI Pt returns for f/u of diabetes mellitus:  DM type: Insulin-requiring type 2 Dx'ed: 4098 Complications: polyneuropathy, DR, and ESRD Therapy: insulin since 2004.   GDM: 1970 DKA: never.   Severe hypoglycemia: last episode was 2017.   Pancreatitis: never.   Pancreatic imaging: never.  Other: she takes QD insulin, after poor results with multiple daily injections.   Interval history: Meter is downloaded today, and the printout is scanned into the record.  Since on the NPH (just a few days), cbg varies from 66-200's.  It is in general higher as the day goes on.  pt states she feels well in general. Past Medical History:  Diagnosis Date  . Anemia   . Arthritis   . Chronic kidney disease   . Diabetes mellitus without complication ([place])   . GERD (gastroesophageal reflux disease)   . Headache   . Hypertension     Past Surgical History:  Procedure Laterality Date  . ABDOMINAL HYSTERECTOMY    . AV FISTULA PLACEMENT Left 10/02/2016   Procedure: ARTERIOVENOUS (AV) FISTULA CREATION LEFT UPPER ARM;  Surgeon: Waynetta Sandy, MD;  Location: [facility];  Service: Vascular;  Laterality: Left;  . CESAREAN SECTION      Social History   Socioeconomic History  . Marital status: Married    Spouse name: Not on file  . Number of children: Not on file  . Years of education: Not on file  . Highest education level: Not on file  Occupational History  . Not on file  Social Needs  . Financial resource strain: Not on file  . Food insecurity:    Worry: Not on file    Inability: Not on file  . Transportation needs:    Medical: Not on file    Non-medical: Not on file  Tobacco Use  . Smoking status: Never Smoker  . Smokeless tobacco: Never Used  Substance and Sexual Activity  . Alcohol use: No  . Drug use: No  . Sexual activity: Not on file  Lifestyle  . Physical activity:    Days per  week: Not on file    Minutes per session: Not on file  . Stress: Not on file  Relationships  . Social connections:    Talks on phone: Not on file    Gets together: Not on file    Attends religious service: Not on file    Active member of club or organization: Not on file    Attends meetings of clubs or organizations: Not on file    Relationship status: Not on file  . Intimate partner violence:    Fear of current or ex partner: Not on file    Emotionally abused: Not on file    Physically abused: Not on file    Forced sexual activity: Not on file  Other Topics Concern  . Not on file  Social History Narrative  . Not on file    Current Outpatient Medications on File Prior to Visit  Medication Sig Dispense Refill  . ACCU-CHEK GUIDE test strip USE TO CHECK BG UP TO 4 TIMES A DAY (DX: E11.22 - DIABETES WITH CKD AND INSULIN THERAPY) IN VITRO  1  . allopurinol (ZYLOPRIM) 100 MG tablet Take 200 mg by mouth daily.     Marland Kitchen amLODipine (NORVASC) 10 MG tablet Take 10 mg by mouth daily.    Marland Kitchen  aspirin 81 MG tablet Take 81 mg by mouth daily.    . B Complex-C-Zn-Folic Acid (DIALYVITE/ZINC) TABS every evening.  3  . B-D ULTRAFINE III SHORT PEN 31G X 8 MM MISC USE TO INJECT INSULIN 4 TIMES DAILY  11  . calcitRIOL (ROCALTROL) 0.25 MCG capsule Take 0.25 mcg by mouth daily.    . calcium acetate (PHOSLO) 667 MG capsule TAKE 1 CAPSULE BY MOUTH THREE TIMES A DAY WITH MEALS TAKE 1 WITH EVERY MEAL  3  . Calcium Carbonate Antacid (TUMS E-X PO) Take by mouth.    . carvedilol (COREG) 25 MG tablet Take 25 mg by mouth 2 (two) times daily with a meal.    . furosemide (LASIX) 40 MG tablet Take 80 mg by mouth daily.     . Iron-FA-B Cmp-C-Biot-Probiotic (FUSION PLUS PO) Take by mouth.    . meclizine (ANTIVERT) 12.5 MG tablet Take 1 tablet (12.5 mg total) by mouth 3 (three) times daily as needed for dizziness. 30 tablet 0  . omeprazole (PRILOSEC) 20 MG capsule Take 1 capsule (20 mg total) by mouth daily. 30 capsule 0  .  rosuvastatin (CRESTOR) 10 MG tablet Take 10 mg by mouth every Monday, Wednesday, and Friday.     No current facility-administered medications on file prior to visit.     Allergies  Allergen Reactions  . Penicillins Rash    Family History  Problem Relation Age of Onset  . Diabetes Mother     BP (!) 160/82 (BP Location: Right Arm, Patient Position: Sitting, Cuff Size: Normal)   Pulse 76   Temp 98.7 F (37.1 C)   Ht 5' (1.524 m)   Wt 191 lb (86.6 kg)   SpO2 98%   BMI 37.30 kg/m    Review of Systems She denies LOC      Objective:   Physical Exam VITAL SIGNS:  See vs page GENERAL: no distress Pulses: dorsalis pedis intact bilat.   MSK: no deformity of the feet CV: trace bilat leg edema.   Skin:  no ulcer on the feet.  normal color and temp on the feet. Neuro: sensation is intact to touch on the feet Ext: There is bilateral onychomycosis of the toenails.   Lab Results  Component Value Date   CREATININE 7.43 (H) 10/09/2018   BUN 78 (H) 10/09/2018   NA 139 10/09/2018   K 4.7 10/09/2018   CL 108 10/09/2018   CO2 20 (L) 10/09/2018   Lab Results  Component Value Date   HGBA1C 8.6 (A) 03/05/2019      Assessment & Plan:  HTN: is noted today: pt will f/u tomorrow at HD. Insulin-requiring type 2 DM, with DR: she needs increased rx. Hypoglycemia: This limits aggressiveness of glycemic control.  ESRD: in this context, he needs a faster-acting qd insulin.   Patient Instructions  check your blood sugar twice a day.  vary the time of day when you check, between before the 3 meals, and at bedtime.  also check if you have symptoms of your blood sugar being too high or too low.  please keep a record of the readings and bring it to your next appointment here (or you can bring the meter itself).  You can write it on any piece of paper.  please call us sooner if your blood sugar goes below 70, or if you have a lot of readings over 200. Please change the NPH insulin to "70/30,"  28  units with breakfast.  Therefore, it will  be important to et breakfast, even on dialysis days. On this type of insulin schedule, you should eat meals on a regular schedule.  If a meal is missed or significantly delayed, your blood sugar could go low.  Please come back for a follow-up appointment in 1 month.

## 2019-03-19 NOTE — Patient Instructions (Addendum)
check your blood sugar twice a day.  vary the time of day when you check, between before the 3 meals, and at bedtime.  also check if you have symptoms of your blood sugar being too high or too low.  please keep a record of the readings and bring it to your next appointment here (or you can bring the meter itself).  You can write it on any piece of paper.  please call us sooner if your blood sugar goes below 70, or if you have a lot of readings over 200. Please change the NPH insulin to "70/30,"  28 units with breakfast.  Therefore, it will be important to et breakfast, even on dialysis days. On this type of insulin schedule, you should eat meals on a regular schedule.  If a meal is missed or significantly delayed, your blood sugar could go low.  Please come back for a follow-up appointment in 1 month.

## 2019-04-18 ENCOUNTER — Other Ambulatory Visit: Payer: Self-pay

## 2019-04-18 ENCOUNTER — Ambulatory Visit (INDEPENDENT_AMBULATORY_CARE_PROVIDER_SITE_OTHER): Payer: Medicare Other | Admitting: Endocrinology

## 2019-04-18 DIAGNOSIS — E1365 Other specified diabetes mellitus with hyperglycemia: Secondary | ICD-10-CM | POA: Diagnosis not present

## 2019-04-18 DIAGNOSIS — IMO0002 Reserved for concepts with insufficient information to code with codable children: Secondary | ICD-10-CM

## 2019-04-18 MED ORDER — INSULIN NPH ISOPHANE & REGULAR (70-30) 100 UNIT/ML ~~LOC~~ SUSP: 26.0000 [IU] | Freq: Every day | SUBCUTANEOUS | 11 refills | Status: DC

## 2019-04-18 NOTE — Progress Notes (Signed)
Subjective:    Patient ID: Katie Garcia, female    DOB: Oct 26, 1941, 78 y.o.   MRN: [id number]  HPI  telehealth visit today via doxy video visit.  Alternatives to telehealth are presented to this patient, and the patient agrees to the telehealth visit. Pt is advised of the cost of the visit, and agrees to this, also.   Patient is at home, and I am at the office.   Pt returns for f/u of diabetes mellitus:  DM type: Insulin-requiring type 2 Dx'ed: 1856 Complications: polyneuropathy, DR, and ESRD Therapy: insulin since 2004.   GDM: 1970 DKA: never.   Severe hypoglycemia: last episode was 2017.    Pancreatitis: never.   Pancreatic imaging: never.  Other: she takes QD insulin, after poor results with multiple daily injections; she was changed to 70/30 qam, due to pattern of cbg's.  Interval history: cbg varies from 55-409.  It is in general higher as the day goes on, but no necessarily so.  pt states she feels well in general, except for allergic rhinitis.   Past Medical History:  Diagnosis Date  . Anemia   . Arthritis   . Chronic kidney disease   . Diabetes mellitus without complication ([place])   . GERD (gastroesophageal reflux disease)   . Headache   . Hypertension     Past Surgical History:  Procedure Laterality Date  . ABDOMINAL HYSTERECTOMY    . AV FISTULA PLACEMENT Left 10/02/2016   Procedure: ARTERIOVENOUS (AV) FISTULA CREATION LEFT UPPER ARM;  Surgeon: Waynetta Sandy, MD;  Location: [facility];  Service: Vascular;  Laterality: Left;  . CESAREAN SECTION      Social History   Socioeconomic History  . Marital status: Married    Spouse name: Not on file  . Number of children: Not on file  . Years of education: Not on file  . Highest education level: Not on file  Occupational History  . Not on file  Social Needs  . Financial resource strain: Not on file  . Food insecurity:    Worry: Not on file    Inability: Not on file  . Transportation needs:    Medical:  Not on file    Non-medical: Not on file  Tobacco Use  . Smoking status: Never Smoker  . Smokeless tobacco: Never Used  Substance and Sexual Activity  . Alcohol use: No  . Drug use: No  . Sexual activity: Not on file  Lifestyle  . Physical activity:    Days per week: Not on file    Minutes per session: Not on file  . Stress: Not on file  Relationships  . Social connections:    Talks on phone: Not on file    Gets together: Not on file    Attends religious service: Not on file    Active member of club or organization: Not on file    Attends meetings of clubs or organizations: Not on file    Relationship status: Not on file  . Intimate partner violence:    Fear of current or ex partner: Not on file    Emotionally abused: Not on file    Physically abused: Not on file    Forced sexual activity: Not on file  Other Topics Concern  . Not on file  Social History Narrative  . Not on file    Current Outpatient Medications on File Prior to Visit  Medication Sig Dispense Refill  . ACCU-CHEK GUIDE test strip USE TO  CHECK BG UP TO 4 TIMES A DAY (DX: E11.22 - DIABETES WITH CKD AND INSULIN THERAPY) IN VITRO  1  . allopurinol (ZYLOPRIM) 100 MG tablet Take 200 mg by mouth daily.     Marland Kitchen amLODipine (NORVASC) 10 MG tablet Take 10 mg by mouth daily.    Marland Kitchen aspirin 81 MG tablet Take 81 mg by mouth daily.    . B Complex-C-Zn-Folic Acid (DIALYVITE/ZINC) TABS every evening.  3  . B-D ULTRAFINE III SHORT PEN 31G X 8 MM MISC USE TO INJECT INSULIN 4 TIMES DAILY  11  . calcitRIOL (ROCALTROL) 0.25 MCG capsule Take 0.25 mcg by mouth daily.    . calcium acetate (PHOSLO) 667 MG capsule TAKE 1 CAPSULE BY MOUTH THREE TIMES A DAY WITH MEALS TAKE 1 WITH EVERY MEAL  3  . Calcium Carbonate Antacid (TUMS E-X PO) Take by mouth.    . carvedilol (COREG) 25 MG tablet Take 25 mg by mouth 2 (two) times daily with a meal.    . furosemide (LASIX) 40 MG tablet Take 80 mg by mouth daily.     . Iron-FA-B Cmp-C-Biot-Probiotic  (FUSION PLUS PO) Take by mouth.    . meclizine (ANTIVERT) 12.5 MG tablet Take 1 tablet (12.5 mg total) by mouth 3 (three) times daily as needed for dizziness. 30 tablet 0  . omeprazole (PRILOSEC) 20 MG capsule Take 1 capsule (20 mg total) by mouth daily. 30 capsule 0  . rosuvastatin (CRESTOR) 10 MG tablet Take 10 mg by mouth every Monday, Wednesday, and Friday.     No current facility-administered medications on file prior to visit.     Allergies  Allergen Reactions  . Penicillins Rash    Family History  Problem Relation Age of Onset  . Diabetes Mother       Review of Systems Denies LOC    Objective:   Physical Exam   Lab Results  Component Value Date   HGBA1C 8.6 (A) 03/05/2019      Assessment & Plan:  Insulin-requiring type 2 DM, with DR: uncertain glycemic control.  Hypoglycemia: This limits aggressiveness of glycemic control. ESRD: he may need a faster-acting qd insulin, but we'll first try reducing current insulin.    Patient Instructions  check your blood sugar twice a day.  vary the time of day when you check, between before the 3 meals, and at bedtime.  also check if you have symptoms of your blood sugar being too high or too low.  please keep a record of the readings and bring it to your next appointment here (or you can bring the meter itself).  You can write it on any piece of paper.  please call us sooner if your blood sugar goes below 70, or if you have a lot of readings over 200. Please reduce the insulin to 26 units with breakfast.  Therefore, it will be important to eat breakfast, even on dialysis days. On this type of insulin schedule, you should eat meals on a regular schedule.  If a meal is missed or significantly delayed, your blood sugar could go low.  Please come back for a follow-up appointment in 1 month.

## 2019-04-18 NOTE — Patient Instructions (Addendum)
check your blood sugar twice a day.  vary the time of day when you check, between before the 3 meals, and at bedtime.  also check if you have symptoms of your blood sugar being too high or too low.  please keep a record of the readings and bring it to your next appointment here (or you can bring the meter itself).  You can write it on any piece of paper.  please call us sooner if your blood sugar goes below 70, or if you have a lot of readings over 200. Please reduce the insulin to 26 units with breakfast.  Therefore, it will be important to eat breakfast, even on dialysis days. On this type of insulin schedule, you should eat meals on a regular schedule.  If a meal is missed or significantly delayed, your blood sugar could go low.  Please come back for a follow-up appointment in 1 month.

## 2019-04-30 ENCOUNTER — Encounter: Payer: Self-pay | Admitting: Podiatry

## 2019-04-30 ENCOUNTER — Other Ambulatory Visit: Payer: Self-pay

## 2019-04-30 ENCOUNTER — Ambulatory Visit: Payer: Medicare Other | Admitting: Podiatry

## 2019-04-30 VITALS — Temp 97.3°F

## 2019-04-30 DIAGNOSIS — M79675 Pain in left toe(s): Secondary | ICD-10-CM

## 2019-04-30 DIAGNOSIS — M79674 Pain in right toe(s): Secondary | ICD-10-CM | POA: Diagnosis not present

## 2019-04-30 DIAGNOSIS — E1122 Type 2 diabetes mellitus with diabetic chronic kidney disease: Secondary | ICD-10-CM

## 2019-04-30 DIAGNOSIS — N186 End stage renal disease: Secondary | ICD-10-CM

## 2019-04-30 DIAGNOSIS — L84 Corns and callosities: Secondary | ICD-10-CM

## 2019-04-30 DIAGNOSIS — B351 Tinea unguium: Secondary | ICD-10-CM | POA: Diagnosis not present

## 2019-04-30 NOTE — Patient Instructions (Addendum)
Diabetes Mellitus and Foot Care Foot care is an important part of your health, especially when you have diabetes. Diabetes may cause you to have problems because of poor blood flow (circulation) to your feet and legs, which can cause your skin to:  Become thinner and drier.  Break more easily.  Heal more slowly.  Peel and crack. You may also have nerve damage (neuropathy) in your legs and feet, causing decreased feeling in them. This means that you may not notice minor injuries to your feet that could lead to more serious problems. Noticing and addressing any potential problems early is the best way to prevent future foot problems. How to care for your feet Foot hygiene  Wash your feet daily with warm water and mild soap. Do not use hot water. Then, pat your feet and the areas between your toes until they are completely dry. Do not soak your feet as this can dry your skin.  Trim your toenails straight across. Do not dig under them or around the cuticle. File the edges of your nails with an emery board or nail file.  Apply a moisturizing lotion or petroleum jelly to the skin on your feet and to dry, brittle toenails. Use lotion that does not contain alcohol and is unscented. Do not apply lotion between your toes. Shoes and socks  Wear clean socks or stockings every day. Make sure they are not too tight. Do not wear knee-high stockings since they may decrease blood flow to your legs.  Wear shoes that fit properly and have enough cushioning. Always look in your shoes before you put them on to be sure there are no objects inside.  To break in new shoes, wear them for just a few hours a day. This prevents injuries on your feet. Wounds, scrapes, corns, and calluses  Check your feet daily for blisters, cuts, bruises, sores, and redness. If you cannot see the bottom of your feet, use a mirror or ask someone for help.  Do not cut corns or calluses or try to remove them with medicine.  If you  find a minor scrape, cut, or break in the skin on your feet, keep it and the skin around it clean and dry. You may clean these areas with mild soap and water. Do not clean the area with peroxide, alcohol, or iodine.  If you have a wound, scrape, corn, or callus on your foot, look at it several times a day to make sure it is healing and not infected. Check for: ? Redness, swelling, or pain. ? Fluid or blood. ? Warmth. ? Pus or a bad smell. General instructions  Do not cross your legs. This may decrease blood flow to your feet.  Do not use heating pads or hot water bottles on your feet. They may burn your skin. If you have lost feeling in your feet or legs, you may not know this is happening until it is too late.  Protect your feet from hot and cold by wearing shoes, such as at the beach or on hot pavement.  Schedule a complete foot exam at least once a year (annually) or more often if you have foot problems. If you have foot problems, report any cuts, sores, or bruises to your health care provider immediately. Contact a health care provider if:  You have a medical condition that increases your risk of infection and you have any cuts, sores, or bruises on your feet.  You have an injury that is not   healing.  You have redness on your legs or feet.  You feel burning or tingling in your legs or feet.  You have pain or cramps in your legs and feet.  Your legs or feet are numb.  Your feet always feel cold.  You have pain around a toenail. Get help right away if:  You have a wound, scrape, corn, or callus on your foot and: ? You have pain, swelling, or redness that gets worse. ? You have fluid or blood coming from the wound, scrape, corn, or callus. ? Your wound, scrape, corn, or callus feels warm to the touch. ? You have pus or a bad smell coming from the wound, scrape, corn, or callus. ? You have a fever. ? You have a red line going up your leg. Summary  Check your feet every day  for cuts, sores, red spots, swelling, and blisters.  Moisturize feet and legs daily.  Wear shoes that fit properly and have enough cushioning.  If you have foot problems, report any cuts, sores, or bruises to your health care provider immediately.  Schedule a complete foot exam at least once a year (annually) or more often if you have foot problems. This information is not intended to replace advice given to you by your health care provider. Make sure you discuss any questions you have with your health care provider. Document Released: 12/08/2000 Document Revised: 01/23/2018 Document Reviewed: 01/12/2017 Elsevier Interactive Patient Education  2019 [employer]  Onychomycosis/Fungal Toenails  WHAT IS IT? An infection that lies within the keratin of your nail plate that is caused by a fungus.  WHY ME? Fungal infections affect all ages, sexes, races, and creeds.  There may be many factors that predispose you to a fungal infection such as age, coexisting medical conditions such as diabetes, or an autoimmune disease; stress, medications, fatigue, genetics, etc.  Bottom line: fungus thrives in a warm, moist environment and your shoes offer such a location.  IS IT CONTAGIOUS? Theoretically, yes.  You do not want to share shoes, nail clippers or files with someone who has fungal toenails.  Walking around barefoot in the same room or sleeping in the same bed is unlikely to transfer the organism.  It is important to realize, however, that fungus can spread easily from one nail to the next on the same foot.  HOW DO WE TREAT THIS?  There are several ways to treat this condition.  Treatment may depend on many factors such as age, medications, pregnancy, liver and kidney conditions, etc.  It is best to ask your doctor which options are available to you.  1. No treatment.   Unlike many other medical concerns, you can live with this condition.  However for many people this can be a painful condition and  may lead to ingrown toenails or a bacterial infection.  It is recommended that you keep the nails cut short to help reduce the amount of fungal nail. 2. Topical treatment.  These range from herbal remedies to prescription strength nail lacquers.  About 40-50% effective, topicals require twice daily application for approximately 9 to 12 months or until an entirely new nail has grown out.  The most effective topicals are medical grade medications available through physicians offices. 3. Oral antifungal medications.  With an 80-90% cure rate, the most common oral medication requires 3 to 4 months of therapy and stays in your system for a year as the new nail grows out.  Oral antifungal medications do require   blood work to make sure it is a safe drug for you.  A liver function panel will be performed prior to starting the medication and after the first month of treatment.  It is important to have the blood work performed to avoid any harmful side effects.  In general, this medication safe but blood work is required. 4. Laser Therapy.  This treatment is performed by applying a specialized laser to the affected nail plate.  This therapy is noninvasive, fast, and non-painful.  It is not covered by insurance and is therefore, out of pocket.  The results have been very good with a 80-95% cure rate.  [facility] is the only practice in the area to offer this therapy. 5. Permanent Nail Avulsion.  Removing the entire nail so that a new nail will not grow back. 

## 2019-04-30 NOTE — Progress Notes (Signed)
Subjective: Katie Garcia is a 78 y.o. y.o. female who presents today with cc of painful, discolored, thick toenails and painful calluses b/l  which interfere with daily activities. Plantar calluses pose a risk for her due to her diabetes. Pain is aggravated when wearing enclosed shoe gear and relieved with periodic professional debridement.  [name], MD is her PCP. She sees Dr. Renato Shin for her diabetes. Last visit with Dr. Loanne Drilling was 04/18/2019.   Current Outpatient Medications:  .  ACCU-CHEK GUIDE test strip, USE TO CHECK BG UP TO 4 TIMES A DAY (DX: E11.22 - DIABETES WITH CKD AND INSULIN THERAPY) IN VITRO, Disp: , Rfl: 1 .  allopurinol (ZYLOPRIM) 100 MG tablet, Take 200 mg by mouth daily. , Disp: , Rfl:  .  amLODipine (NORVASC) 10 MG tablet, Take 10 mg by mouth daily., Disp: , Rfl:  .  aspirin 81 MG tablet, Take 81 mg by mouth daily., Disp: , Rfl:  .  B Complex-C-Zn-Folic Acid (DIALYVITE/ZINC) TABS, every evening., Disp: , Rfl: 3 .  B-D ULTRAFINE III SHORT PEN 31G X 8 MM MISC, USE TO INJECT INSULIN 4 TIMES DAILY, Disp: , Rfl: 11 .  calcitRIOL (ROCALTROL) 0.25 MCG capsule, Take 0.25 mcg by mouth daily., Disp: , Rfl:  .  calcium acetate (PHOSLO) 667 MG capsule, TAKE 1 CAPSULE BY MOUTH THREE TIMES A DAY WITH MEALS TAKE 1 WITH EVERY MEAL, Disp: , Rfl: 3 .  Calcium Carbonate Antacid (TUMS E-X PO), Take by mouth., Disp: , Rfl:  .  carvedilol (COREG) 25 MG tablet, Take 25 mg by mouth 2 (two) times daily with a meal., Disp: , Rfl:  .  escitalopram (LEXAPRO) 5 MG tablet, , Disp: , Rfl:  .  furosemide (LASIX) 40 MG tablet, Take 80 mg by mouth daily. , Disp: , Rfl:  .  insulin NPH-regular Human (NOVOLIN 70/30) (70-30) 100 UNIT/ML injection, Inject 26 Units into the skin daily with breakfast., Disp: 10 mL, Rfl: 11 .  Iron-FA-B Cmp-C-Biot-Probiotic (FUSION PLUS PO), Take by mouth., Disp: , Rfl:  .  lisinopril (ZESTRIL) 10 MG tablet, TAKE 1 TABLET BY MOUTH EVERY DAY AT BEDTIME AS  DIRECTED, Disp: , Rfl:  .  loratadine (CLARITIN) 10 MG tablet, Take 10 mg by mouth daily., Disp: , Rfl:  .  meclizine (ANTIVERT) 12.5 MG tablet, Take 1 tablet (12.5 mg total) by mouth 3 (three) times daily as needed for dizziness., Disp: 30 tablet, Rfl: 0 .  nystatin (MYCOSTATIN) 100000 UNIT/ML suspension, TAKE 4 ML BY MOUTH FOUR TIMES A DAY FOR 10 DAYS, Disp: , Rfl:  .  omeprazole (PRILOSEC) 20 MG capsule, Take 1 capsule (20 mg total) by mouth daily., Disp: 30 capsule, Rfl: 0 .  RELION INSULIN SYR 0.5ML/31G 31G X 5/16" 0.5 ML MISC, USE 1 ONCE DAILY, Disp: , Rfl:  .  rosuvastatin (CRESTOR) 10 MG tablet, Take 10 mg by mouth every Monday, Wednesday, and Friday., Disp: , Rfl:   Allergies  Allergen Reactions  . Penicillins Rash    Objective:  Vascular Examination: Capillary refill time <3 seconds x 10 digits.  Dorsalis pedis pulses 2/4 b/l.  Posterior tibial pulses 1/4 b/l.  Digital hair absent x 10 digits.  Skin temperature gradient WNL b/l.  Edema b/l LE with dependent rubor b/l.  No warmth. No pain with calf compression.   Dermatological Examination: Skin with normal turgor, texture and tone b/l.  Toenails 1-5 b/l discolored, thick, dystrophic with subungual debris and pain with palpation to nailbeds due  to thickness of nails.  Hyperkeratotic lesion submet head 5 b/l. No erythema, no edema, no drainage, no flocculence noted.   Musculoskeletal: Muscle strength 5/5 to all LE muscle groups.  No pain, crepitus or joint limitation noted with ROM.  Neurological: Sensation intact with 10 gram monofilament 5/5 b/l.  Vibratory sensation intact b/l.  Assessment: 1. Painful onychomycosis toenails 1-5 b/l 2.  Callus submet head 5 b/l 3.  NIDDM with ESRD  Plan: 1. Continue diabetic foot care principles. Literature dispensed on today. 2. Toenails 1-5 b/l were debrided in length and girth without iatrogenic bleeding. 3. Hyperkeratotic lesion(s) submet head 5 b/l pared with  sterile scalpel blade without incident. 4. Patient to continue soft, supportive shoe gear daily. 5. Patient to report any pedal injuries to medical professional immediately. 6. Follow up 3 months.  7. Patient/POA to call should there be a concern in the interim.

## 2019-07-24 ENCOUNTER — Encounter: Payer: Self-pay | Admitting: Nephrology

## 2019-07-28 DIAGNOSIS — T7840XA Allergy, unspecified, initial encounter: Secondary | ICD-10-CM | POA: Insufficient documentation

## 2019-07-28 DIAGNOSIS — T782XXA Anaphylactic shock, unspecified, initial encounter: Secondary | ICD-10-CM | POA: Insufficient documentation

## 2019-07-30 ENCOUNTER — Encounter: Payer: Self-pay | Admitting: Podiatry

## 2019-07-30 ENCOUNTER — Ambulatory Visit: Payer: Medicare Other | Admitting: Podiatry

## 2019-07-30 ENCOUNTER — Other Ambulatory Visit: Payer: Self-pay

## 2019-07-30 VITALS — Temp 97.1°F

## 2019-07-30 DIAGNOSIS — E1122 Type 2 diabetes mellitus with diabetic chronic kidney disease: Secondary | ICD-10-CM | POA: Diagnosis not present

## 2019-07-30 DIAGNOSIS — M79674 Pain in right toe(s): Secondary | ICD-10-CM

## 2019-07-30 DIAGNOSIS — L84 Corns and callosities: Secondary | ICD-10-CM

## 2019-07-30 DIAGNOSIS — B351 Tinea unguium: Secondary | ICD-10-CM | POA: Diagnosis not present

## 2019-07-30 DIAGNOSIS — M79675 Pain in left toe(s): Secondary | ICD-10-CM | POA: Diagnosis not present

## 2019-07-30 DIAGNOSIS — N186 End stage renal disease: Secondary | ICD-10-CM | POA: Diagnosis not present

## 2019-07-30 NOTE — Patient Instructions (Signed)
Diabetes Mellitus and Foot Care Foot care is an important part of your health, especially when you have diabetes. Diabetes may cause you to have problems because of poor blood flow (circulation) to your feet and legs, which can cause your skin to:  Become thinner and drier.  Break more easily.  Heal more slowly.  Peel and crack. You may also have nerve damage (neuropathy) in your legs and feet, causing decreased feeling in them. This means that you may not notice minor injuries to your feet that could lead to more serious problems. Noticing and addressing any potential problems early is the best way to prevent future foot problems. How to care for your feet Foot hygiene  Wash your feet daily with warm water and mild soap. Do not use hot water. Then, pat your feet and the areas between your toes until they are completely dry. Do not soak your feet as this can dry your skin.  Trim your toenails straight across. Do not dig under them or around the cuticle. File the edges of your nails with an emery board or nail file.  Apply a moisturizing lotion or petroleum jelly to the skin on your feet and to dry, brittle toenails. Use lotion that does not contain alcohol and is unscented. Do not apply lotion between your toes. Shoes and socks  Wear clean socks or stockings every day. Make sure they are not too tight. Do not wear knee-high stockings since they may decrease blood flow to your legs.  Wear shoes that fit properly and have enough cushioning. Always look in your shoes before you put them on to be sure there are no objects inside.  To break in new shoes, wear them for just a few hours a day. This prevents injuries on your feet. Wounds, scrapes, corns, and calluses  Check your feet daily for blisters, cuts, bruises, sores, and redness. If you cannot see the bottom of your feet, use a mirror or ask someone for help.  Do not cut corns or calluses or try to remove them with medicine.  If you  find a minor scrape, cut, or break in the skin on your feet, keep it and the skin around it clean and dry. You may clean these areas with mild soap and water. Do not clean the area with peroxide, alcohol, or iodine.  If you have a wound, scrape, corn, or callus on your foot, look at it several times a day to make sure it is healing and not infected. Check for: ? Redness, swelling, or pain. ? Fluid or blood. ? Warmth. ? Pus or a bad smell. General instructions  Do not cross your legs. This may decrease blood flow to your feet.  Do not use heating pads or hot water bottles on your feet. They may burn your skin. If you have lost feeling in your feet or legs, you may not know this is happening until it is too late.  Protect your feet from hot and cold by wearing shoes, such as at the beach or on hot pavement.  Schedule a complete foot exam at least once a year (annually) or more often if you have foot problems. If you have foot problems, report any cuts, sores, or bruises to your health care provider immediately. Contact a health care provider if:  You have a medical condition that increases your risk of infection and you have any cuts, sores, or bruises on your feet.  You have an injury that is not   healing.  You have redness on your legs or feet.  You feel burning or tingling in your legs or feet.  You have pain or cramps in your legs and feet.  Your legs or feet are numb.  Your feet always feel cold.  You have pain around a toenail. Get help right away if:  You have a wound, scrape, corn, or callus on your foot and: ? You have pain, swelling, or redness that gets worse. ? You have fluid or blood coming from the wound, scrape, corn, or callus. ? Your wound, scrape, corn, or callus feels warm to the touch. ? You have pus or a bad smell coming from the wound, scrape, corn, or callus. ? You have a fever. ? You have a red line going up your leg. Summary  Check your feet every day  for cuts, sores, red spots, swelling, and blisters.  Moisturize feet and legs daily.  Wear shoes that fit properly and have enough cushioning.  If you have foot problems, report any cuts, sores, or bruises to your health care provider immediately.  Schedule a complete foot exam at least once a year (annually) or more often if you have foot problems. This information is not intended to replace advice given to you by your health care provider. Make sure you discuss any questions you have with your health care provider. Document Released: 12/08/2000 Document Revised: 01/23/2018 Document Reviewed: 01/12/2017 Elsevier Patient Education  2020 [employer]   Onychomycosis/Fungal Toenails  WHAT IS IT? An infection that lies within the keratin of your nail plate that is caused by a fungus.  WHY ME? Fungal infections affect all ages, sexes, races, and creeds.  There may be many factors that predispose you to a fungal infection such as age, coexisting medical conditions such as diabetes, or an autoimmune disease; stress, medications, fatigue, genetics, etc.  Bottom line: fungus thrives in a warm, moist environment and your shoes offer such a location.  IS IT CONTAGIOUS? Theoretically, yes.  You do not want to share shoes, nail clippers or files with someone who has fungal toenails.  Walking around barefoot in the same room or sleeping in the same bed is unlikely to transfer the organism.  It is important to realize, however, that fungus can spread easily from one nail to the next on the same foot.  HOW DO WE TREAT THIS?  There are several ways to treat this condition.  Treatment may depend on many factors such as age, medications, pregnancy, liver and kidney conditions, etc.  It is best to ask your doctor which options are available to you.  1. No treatment.   Unlike many other medical concerns, you can live with this condition.  However for many people this can be a painful condition and may lead to  ingrown toenails or a bacterial infection.  It is recommended that you keep the nails cut short to help reduce the amount of fungal nail. 2. Topical treatment.  These range from herbal remedies to prescription strength nail lacquers.  About 40-50% effective, topicals require twice daily application for approximately 9 to 12 months or until an entirely new nail has grown out.  The most effective topicals are medical grade medications available through physicians offices. 3. Oral antifungal medications.  With an 80-90% cure rate, the most common oral medication requires 3 to 4 months of therapy and stays in your system for a year as the new nail grows out.  Oral antifungal medications do require   blood work to make sure it is a safe drug for you.  A liver function panel will be performed prior to starting the medication and after the first month of treatment.  It is important to have the blood work performed to avoid any harmful side effects.  In general, this medication safe but blood work is required. 4. Laser Therapy.  This treatment is performed by applying a specialized laser to the affected nail plate.  This therapy is noninvasive, fast, and non-painful.  It is not covered by insurance and is therefore, out of pocket.  The results have been very good with a 80-95% cure rate.  [facility] is the only practice in the area to offer this therapy. 5. Permanent Nail Avulsion.  Removing the entire nail so that a new nail will not grow back. 

## 2019-08-03 NOTE — Progress Notes (Signed)
Subjective: Katie Garcia is a 78 y.o. y.o. female who presents today for preventative diabetic foot care. She resents with cc of painful, discolored, thick toenails and calluses.  Pain is aggravated when wearing enclosed shoe gear and relieved with periodic professional debridement.  She voices no new pedal concerns on today's visit.   Current Outpatient Medications:  .  ACCU-CHEK GUIDE test strip, USE TO CHECK BG UP TO 4 TIMES A DAY (DX: E11.22 - DIABETES WITH CKD AND INSULIN THERAPY) IN VITRO, Disp: , Rfl: 1 .  allopurinol (ZYLOPRIM) 100 MG tablet, Take 200 mg by mouth daily. , Disp: , Rfl:  .  amLODipine (NORVASC) 10 MG tablet, Take 10 mg by mouth daily., Disp: , Rfl:  .  aspirin 81 MG tablet, Take 81 mg by mouth daily., Disp: , Rfl:  .  B Complex-C-Zn-Folic Acid (DIALYVITE/ZINC) TABS, every evening., Disp: , Rfl: 3 .  B-D ULTRAFINE III SHORT PEN 31G X 8 MM MISC, USE TO INJECT INSULIN 4 TIMES DAILY, Disp: , Rfl: 11 .  calcitRIOL (ROCALTROL) 0.25 MCG capsule, Take 0.25 mcg by mouth daily., Disp: , Rfl:  .  calcium acetate (PHOSLO) 667 MG capsule, TAKE 1 CAPSULE BY MOUTH THREE TIMES A DAY WITH MEALS TAKE 1 WITH EVERY MEAL, Disp: , Rfl: 3 .  Calcium Carbonate Antacid (TUMS E-X PO), Take by mouth., Disp: , Rfl:  .  carvedilol (COREG) 25 MG tablet, Take 25 mg by mouth 2 (two) times daily with a meal., Disp: , Rfl:  .  Dextromethorphan-guaiFENesin (MUCINEX DM) 30-600 MG TB12, TAKE 1 TABLET BY MOUTH EVERY 12 HOURS FOR 7 DAYS AS NEEDED, Disp: , Rfl:  .  escitalopram (LEXAPRO) 5 MG tablet, , Disp: , Rfl:  .  esomeprazole (NEXIUM) 20 MG capsule, Take by mouth daily., Disp: , Rfl:  .  furosemide (LASIX) 40 MG tablet, Take 80 mg by mouth daily. , Disp: , Rfl:  .  insulin NPH-regular Human (NOVOLIN 70/30) (70-30) 100 UNIT/ML injection, Inject 26 Units into the skin daily with breakfast., Disp: 10 mL, Rfl: 11 .  Iron-FA-B Cmp-C-Biot-Probiotic (FUSION PLUS PO), Take by mouth., Disp: , Rfl:  .   lisinopril (ZESTRIL) 10 MG tablet, TAKE 1 TABLET BY MOUTH EVERY DAY AT BEDTIME AS DIRECTED, Disp: , Rfl:  .  loratadine (CLARITIN) 10 MG tablet, Take 10 mg by mouth daily., Disp: , Rfl:  .  meclizine (ANTIVERT) 12.5 MG tablet, Take 1 tablet (12.5 mg total) by mouth 3 (three) times daily as needed for dizziness., Disp: 30 tablet, Rfl: 0 .  nystatin (MYCOSTATIN) 100000 UNIT/ML suspension, TAKE 4 ML BY MOUTH FOUR TIMES A DAY FOR 10 DAYS, Disp: , Rfl:  .  omeprazole (PRILOSEC) 20 MG capsule, Take 1 capsule (20 mg total) by mouth daily., Disp: 30 capsule, Rfl: 0 .  RELION INSULIN SYR 0.5ML/31G 31G X 5/16" 0.5 ML MISC, USE 1 ONCE DAILY, Disp: , Rfl:  .  rosuvastatin (CRESTOR) 10 MG tablet, Take 10 mg by mouth every Monday, Wednesday, and Friday., Disp: , Rfl:  .  triamcinolone cream (KENALOG) 0.1 %, APPLY CREAM EXTERNALLY TO AFFECTED AREA UP TO TWICE DAILY AS NEEDED (NOT TO FACE GROIN AXILLA), Disp: , Rfl:   Allergies  Allergen Reactions  . Penicillins Rash    Objective: Vitals:   07/30/19 1059  Temp: (!) 97.1 F (36.2 C)    Vascular Examination: Capillary refill time <3 seconds x 10 digits.  Dorsalis pedis pulses 2/4 b/l.  Posterior tibial pulses 1/4  b/l.  Digital hair absent x 10 digits.  Skin temperature gradient WNL b/l.  Bilateral LE edema with dependent rubor. No increased warmth nor pain.   Dermatological Examination: Skin with normal turgor, texture and tone b/l.  Toenails 1-5 b/l discolored, thick, dystrophic with subungual debris and pain with palpation to nailbeds due to thickness of nails.  Hyperkeratotic lesion submet head 5 b/l. No erythema, no edema, no drainage, no flocculence noted.  Musculoskeletal: Muscle strength 5/5 to all LE muscle groups b/l.  Neurological: Sensation intact 5/5 b/l with 10 gram monofilament.  Vibratory sensation intact b/l.  Assessment: 1. Painful onychomycosis toenails 1-5 b/l 2.  Callus submet head 5 b/l 3.  NIDDM with  ESRD  Plan: 1. Continue diabetic foot care principles. Literature dispensed on today. 2. Toenails 1-5 b/l were debrided in length and girth without iatrogenic bleeding. 3. Hyperkeratotic lesion submet head 5 b/l pared with sterile scalpel blade without incident. 4. Patient to continue soft, supportive shoe gear daily. 5. Patient to report any pedal injuries to medical professional immediately. 6. Follow up 3 months.  7. Patient/POA to call should there be a concern in the interim.

## 2019-08-25 ENCOUNTER — Telehealth: Payer: Self-pay | Admitting: Internal Medicine

## 2019-08-25 NOTE — Telephone Encounter (Signed)
I received a call from the answering service.  Informed that Katie Garcia from [facility] called and needed a call back tonight regarding clarification.  Answering service did not know the medication name and did not know what [employer] needed specifically.  Just had a phone number and message to call back tonight.  The phone number given [phone].  Called multiple times - message stated - not a working number.  Called back to [facility].  They could not find any documentation of this call and unable to get in touch with "[place] number" to get any more information.  The [place] number [phone] - stated not accepting incoming calls.  There was no documentation in chart of any communication today and since unable to get any information or contact [employer] to see what was needed, I called and spoke to the pt - Katie Garcia.  She was not aware of anything she needed or anything outstanding.  Stated she was doing fine and was comfortable with me sending a message to Dr Loanne Drilling to see if can clarify things tomorrow.

## 2019-08-25 NOTE — Telephone Encounter (Signed)
please contact patient: Ov is due

## 2019-08-26 ENCOUNTER — Other Ambulatory Visit: Payer: Self-pay

## 2019-08-26 ENCOUNTER — Telehealth: Payer: Self-pay | Admitting: Endocrinology

## 2019-08-26 DIAGNOSIS — IMO0002 Reserved for concepts with insufficient information to code with codable children: Secondary | ICD-10-CM

## 2019-08-26 DIAGNOSIS — E1365 Other specified diabetes mellitus with hyperglycemia: Secondary | ICD-10-CM

## 2019-08-26 MED ORDER — HUMULIN 70/30 (70-30) 100 UNIT/ML ~~LOC~~ SUSP: 26.0000 [IU] | Freq: Every day | SUBCUTANEOUS | 2 refills | Status: DC

## 2019-08-26 NOTE — Telephone Encounter (Signed)
Patient has been scheduled for appointment on 08/29/19 at 9:45 a.m.

## 2019-08-26 NOTE — Telephone Encounter (Signed)
Stated they sent [place] a fax in regards to patient needs a RX refill, please call provider number below for more information.  Thanks

## 2019-08-26 NOTE — Telephone Encounter (Signed)
This is a very vague message and it is not known what [employer] is referring to. This pt has not received any paperwork from any outside entity in which we were required to complete nor return via fax. Closed due to insufficient information.

## 2019-08-26 NOTE — Telephone Encounter (Signed)
[employer] called on a status of paperwork they faxed [place] on August 26th.  Please Advise, Thanks  PH# [phone]

## 2019-08-26 NOTE — Telephone Encounter (Signed)
Please call pt to schedule an appt per Dr. Cordelia Pen request.

## 2019-08-29 ENCOUNTER — Other Ambulatory Visit: Payer: Self-pay

## 2019-08-29 ENCOUNTER — Ambulatory Visit (INDEPENDENT_AMBULATORY_CARE_PROVIDER_SITE_OTHER): Payer: Medicare Other | Admitting: Endocrinology

## 2019-08-29 ENCOUNTER — Encounter: Payer: Self-pay | Admitting: Endocrinology

## 2019-08-29 VITALS — BP 102/52 | HR 83 | Ht 60.0 in | Wt 192.2 lb

## 2019-08-29 DIAGNOSIS — E1365 Other specified diabetes mellitus with hyperglycemia: Secondary | ICD-10-CM

## 2019-08-29 DIAGNOSIS — IMO0002 Reserved for concepts with insufficient information to code with codable children: Secondary | ICD-10-CM

## 2019-08-29 LAB — POCT GLYCOSYLATED HEMOGLOBIN (HGB A1C): Hemoglobin A1C: 8.8 % — AB (ref 4.0–5.6)

## 2019-08-29 NOTE — Patient Instructions (Addendum)
check your blood sugar twice a day.  vary the time of day when you check, between before the 3 meals, and at bedtime.  also check if you have symptoms of your blood sugar being too high or too low.  please keep a record of the readings and bring it to your next appointment here (or you can bring the meter itself).  You can write it on any piece of paper.  please call us sooner if your blood sugar goes below 70, or if you have a lot of readings over 200. A different type of blood test is requested for you today.  We'll let you know about the results.  We'll slightly increase the insulin if it is high.  On this type of insulin schedule, you should eat meals on a regular schedule.  If a meal is missed or significantly delayed, your blood sugar could go low.  Please come back for a follow-up appointment in 2 months.

## 2019-08-29 NOTE — Progress Notes (Signed)
Subjective:    Patient ID: Katie Garcia, female    DOB: 02-12-1941, 78 y.o.   MRN: [id number]  HPI Pt returns for f/u of diabetes mellitus:  DM type: Insulin-requiring type 2 Dx'ed: [id number] Complications: polyneuropathy, DR, and ESRD.  Therapy: insulin since 2004.   GDM: 1970 DKA: never.   Severe hypoglycemia: last episode was 2017.    Pancreatitis: never.   Pancreatic imaging: never.  Other: she takes QD insulin, after poor results with multiple daily injections; she was changed to 70/30 qam, due to pattern of cbg's.   Interval history: cbg varies from 64-400.  It is in general lowest after a smaller-than-expected meal.  However, cbg is rarely below 70.  pt states she feels well in general.  Past Medical History:  Diagnosis Date  . Anemia   . Arthritis   . Chronic kidney disease   . Diabetes mellitus without complication ([place])   . GERD (gastroesophageal reflux disease)   . Headache   . Hypertension     Past Surgical History:  Procedure Laterality Date  . ABDOMINAL HYSTERECTOMY    . AV FISTULA PLACEMENT Left 10/02/2016   Procedure: ARTERIOVENOUS (AV) FISTULA CREATION LEFT UPPER ARM;  Surgeon: Waynetta Sandy, MD;  Location: [place];  Service: Vascular;  Laterality: Left;  . CESAREAN SECTION      Social History   Socioeconomic History  . Marital status: Married    Spouse name: Not on file  . Number of children: Not on file  . Years of education: Not on file  . Highest education level: Not on file  Occupational History  . Not on file  Social Needs  . Financial resource strain: Not on file  . Food insecurity    Worry: Not on file    Inability: Not on file  . Transportation needs    Medical: Not on file    Non-medical: Not on file  Tobacco Use  . Smoking status: Never Smoker  . Smokeless tobacco: Never Used  Substance and Sexual Activity  . Alcohol use: No  . Drug use: No  . Sexual activity: Not on file  Lifestyle  . Physical activity    Days per week:  Not on file    Minutes per session: Not on file  . Stress: Not on file  Relationships  . Social Herbalist on phone: Not on file    Gets together: Not on file    Attends religious service: Not on file    Active member of club or organization: Not on file    Attends meetings of clubs or organizations: Not on file    Relationship status: Not on file  . Intimate partner violence    Fear of current or ex partner: Not on file    Emotionally abused: Not on file    Physically abused: Not on file    Forced sexual activity: Not on file  Other Topics Concern  . Not on file  Social History Narrative  . Not on file    Current Outpatient Medications on File Prior to Visit  Medication Sig Dispense Refill  . ACCU-CHEK GUIDE test strip USE TO CHECK BG UP TO 4 TIMES A DAY (DX: E11.22 - DIABETES WITH CKD AND INSULIN THERAPY) IN VITRO  1  . allopurinol (ZYLOPRIM) 100 MG tablet Take 200 mg by mouth daily.     Marland Kitchen amLODipine (NORVASC) 10 MG tablet Take 10 mg by mouth daily.    Marland Kitchen  aspirin 81 MG tablet Take 81 mg by mouth daily.    . B Complex-C-Zn-Folic Acid (DIALYVITE/ZINC) TABS every evening.  3  . calcitRIOL (ROCALTROL) 0.25 MCG capsule Take 0.25 mcg by mouth daily.    . calcium acetate (PHOSLO) 667 MG capsule TAKE 1 CAPSULE BY MOUTH THREE TIMES A DAY WITH MEALS TAKE 1 WITH EVERY MEAL  3  . Calcium Carbonate Antacid (TUMS E-X PO) Take by mouth.    . carvedilol (COREG) 25 MG tablet Take 25 mg by mouth 2 (two) times daily with a meal.    . Dextromethorphan-guaiFENesin (MUCINEX DM) 30-600 MG TB12 TAKE 1 TABLET BY MOUTH EVERY 12 HOURS FOR 7 DAYS AS NEEDED    . escitalopram (LEXAPRO) 5 MG tablet     . esomeprazole (NEXIUM) 20 MG capsule Take by mouth daily.    . furosemide (LASIX) 40 MG tablet Take 80 mg by mouth daily.     . insulin NPH-regular Human (HUMULIN 70/30) (70-30) 100 UNIT/ML injection Inject 26 Units into the skin daily with breakfast. Inject 26 Units into the skin daily with  breakfast. 8 mL 2  . Iron-FA-B Cmp-C-Biot-Probiotic (FUSION PLUS PO) Take by mouth.    Marland Kitchen lisinopril (ZESTRIL) 10 MG tablet TAKE 1 TABLET BY MOUTH EVERY DAY AT BEDTIME AS DIRECTED    . loratadine (CLARITIN) 10 MG tablet Take 10 mg by mouth daily.    . meclizine (ANTIVERT) 12.5 MG tablet Take 1 tablet (12.5 mg total) by mouth 3 (three) times daily as needed for dizziness. 30 tablet 0  . nystatin (MYCOSTATIN) 100000 UNIT/ML suspension TAKE 4 ML BY MOUTH FOUR TIMES A DAY FOR 10 DAYS    . omeprazole (PRILOSEC) 20 MG capsule Take 1 capsule (20 mg total) by mouth daily. 30 capsule 0  . RELION INSULIN SYR 0.5ML/31G 31G X 5/16" 0.5 ML MISC USE 1 ONCE DAILY    . rosuvastatin (CRESTOR) 10 MG tablet Take 10 mg by mouth every Monday, Wednesday, and Friday.    . triamcinolone cream (KENALOG) 0.1 % APPLY CREAM EXTERNALLY TO AFFECTED AREA UP TO TWICE DAILY AS NEEDED (NOT TO FACE GROIN AXILLA)     No current facility-administered medications on file prior to visit.     Allergies  Allergen Reactions  . Penicillins Rash    Family History  Problem Relation Age of Onset  . Diabetes Mother     BP (!) 102/52 (BP Location: Right Wrist, Patient Position: Sitting, Cuff Size: Normal)   Pulse 83   Ht 5' (1.524 m)   Wt 192 lb 3.2 oz (87.2 kg)   SpO2 93%   BMI 37.54 kg/m   Review of Systems Denies LOC.      Objective:   Physical Exam VITAL SIGNS:  See vs page GENERAL: no distress Pulses: dorsalis pedis intact bilat.   MSK: no deformity of the feet CV: no leg edema Skin:  no ulcer on the feet.  normal color and temp on the feet. Neuro: sensation is intact to touch on the feet  Lab Results  Component Value Date   HGBA1C 8.8 (A) 08/29/2019   Lab Results  Component Value Date   CREATININE 7.43 (H) 10/09/2018   BUN 78 (H) 10/09/2018   NA 139 10/09/2018   K 4.7 10/09/2018   CL 108 10/09/2018   CO2 20 (L) 10/09/2018       Assessment & Plan:  Insulin-requiring type 2 DM, with DR: uncertain  glycemic control.  Check fructosamine Renal failure: in this  setting, he needs a fast-acting qd insulin Hypoglycemia: this limits aggressiveness of glycemic control.    Patient Instructions  check your blood sugar twice a day.  vary the time of day when you check, between before the 3 meals, and at bedtime.  also check if you have symptoms of your blood sugar being too high or too low.  please keep a record of the readings and bring it to your next appointment here (or you can bring the meter itself).  You can write it on any piece of paper.  please call us sooner if your blood sugar goes below 70, or if you have a lot of readings over 200. A different type of blood test is requested for you today.  We'll let you know about the results.  We'll slightly increase the insulin if it is high.  On this type of insulin schedule, you should eat meals on a regular schedule.  If a meal is missed or significantly delayed, your blood sugar could go low.  Please come back for a follow-up appointment in 2 months.

## 2019-09-02 LAB — FRUCTOSAMINE: Fructosamine: 408 umol/L — ABNORMAL HIGH (ref 205–285)

## 2019-09-04 ENCOUNTER — Other Ambulatory Visit: Payer: Self-pay

## 2019-10-29 ENCOUNTER — Ambulatory Visit: Payer: Medicare Other | Admitting: Endocrinology

## 2019-10-29 ENCOUNTER — Ambulatory Visit: Payer: Medicare Other | Admitting: Podiatry

## 2019-11-12 ENCOUNTER — Ambulatory Visit: Payer: Medicare Other | Admitting: Podiatry

## 2019-11-14 ENCOUNTER — Encounter: Payer: Self-pay | Admitting: Podiatry

## 2019-11-14 ENCOUNTER — Ambulatory Visit: Payer: Medicare Other | Admitting: Podiatry

## 2019-11-14 ENCOUNTER — Other Ambulatory Visit: Payer: Self-pay

## 2019-11-14 DIAGNOSIS — E1122 Type 2 diabetes mellitus with diabetic chronic kidney disease: Secondary | ICD-10-CM

## 2019-11-14 DIAGNOSIS — N186 End stage renal disease: Secondary | ICD-10-CM | POA: Diagnosis not present

## 2019-11-14 DIAGNOSIS — M79675 Pain in left toe(s): Secondary | ICD-10-CM

## 2019-11-14 DIAGNOSIS — M79674 Pain in right toe(s): Secondary | ICD-10-CM

## 2019-11-14 DIAGNOSIS — B351 Tinea unguium: Secondary | ICD-10-CM

## 2019-11-14 DIAGNOSIS — L84 Corns and callosities: Secondary | ICD-10-CM

## 2019-11-14 NOTE — Patient Instructions (Signed)
Diabetes Mellitus and Foot Care Foot care is an important part of your health, especially when you have diabetes. Diabetes may cause you to have problems because of poor blood flow (circulation) to your feet and legs, which can cause your skin to:  Become thinner and drier.  Break more easily.  Heal more slowly.  Peel and crack. You may also have nerve damage (neuropathy) in your legs and feet, causing decreased feeling in them. This means that you may not notice minor injuries to your feet that could lead to more serious problems. Noticing and addressing any potential problems early is the best way to prevent future foot problems. How to care for your feet Foot hygiene  Wash your feet daily with warm water and mild soap. Do not use hot water. Then, pat your feet and the areas between your toes until they are completely dry. Do not soak your feet as this can dry your skin.  Trim your toenails straight across. Do not dig under them or around the cuticle. File the edges of your nails with an emery board or nail file.  Apply a moisturizing lotion or petroleum jelly to the skin on your feet and to dry, brittle toenails. Use lotion that does not contain alcohol and is unscented. Do not apply lotion between your toes. Shoes and socks  Wear clean socks or stockings every day. Make sure they are not too tight. Do not wear knee-high stockings since they may decrease blood flow to your legs.  Wear shoes that fit properly and have enough cushioning. Always look in your shoes before you put them on to be sure there are no objects inside.  To break in new shoes, wear them for just a few hours a day. This prevents injuries on your feet. Wounds, scrapes, corns, and calluses  Check your feet daily for blisters, cuts, bruises, sores, and redness. If you cannot see the bottom of your feet, use a mirror or ask someone for help.  Do not cut corns or calluses or try to remove them with medicine.  If you  find a minor scrape, cut, or break in the skin on your feet, keep it and the skin around it clean and dry. You may clean these areas with mild soap and water. Do not clean the area with peroxide, alcohol, or iodine.  If you have a wound, scrape, corn, or callus on your foot, look at it several times a day to make sure it is healing and not infected. Check for: ? Redness, swelling, or pain. ? Fluid or blood. ? Warmth. ? Pus or a bad smell. General instructions  Do not cross your legs. This may decrease blood flow to your feet.  Do not use heating pads or hot water bottles on your feet. They may burn your skin. If you have lost feeling in your feet or legs, you may not know this is happening until it is too late.  Protect your feet from hot and cold by wearing shoes, such as at the beach or on hot pavement.  Schedule a complete foot exam at least once a year (annually) or more often if you have foot problems. If you have foot problems, report any cuts, sores, or bruises to your health care provider immediately. Contact a health care provider if:  You have a medical condition that increases your risk of infection and you have any cuts, sores, or bruises on your feet.  You have an injury that is not   healing.  You have redness on your legs or feet.  You feel burning or tingling in your legs or feet.  You have pain or cramps in your legs and feet.  Your legs or feet are numb.  Your feet always feel cold.  You have pain around a toenail. Get help right away if:  You have a wound, scrape, corn, or callus on your foot and: ? You have pain, swelling, or redness that gets worse. ? You have fluid or blood coming from the wound, scrape, corn, or callus. ? Your wound, scrape, corn, or callus feels warm to the touch. ? You have pus or a bad smell coming from the wound, scrape, corn, or callus. ? You have a fever. ? You have a red line going up your leg. Summary  Check your feet every day  for cuts, sores, red spots, swelling, and blisters.  Moisturize feet and legs daily.  Wear shoes that fit properly and have enough cushioning.  If you have foot problems, report any cuts, sores, or bruises to your health care provider immediately.  Schedule a complete foot exam at least once a year (annually) or more often if you have foot problems. This information is not intended to replace advice given to you by your health care provider. Make sure you discuss any questions you have with your health care provider. Document Released: 12/08/2000 Document Revised: 01/23/2018 Document Reviewed: 01/12/2017 Elsevier Patient Education  2020 [employer]  

## 2019-11-22 NOTE — Progress Notes (Signed)
Subjective: Katie Garcia is seen today for diabetic foot care follow up painful calluses and elongated, thickened toenails bilateral feet that she cannot cut. Pain interferes with daily activities. Aggravating factor includes wearing enclosed shoe gear and relieved with periodic debridement.  Medications reviewed in chart.  Allergies  Allergen Reactions  . Penicillins Rash     Objective:  Vascular Examination: Capillary refill time <3 seconds b/l.  Dorsalis pedis present b/l.  Posterior tibial pulses faintly palpable b/l.  Digital hair absent b/l.   Skin temperature gradient WNL b/l.   Bilateral LE edema with dependent rubor. No open wounds. No pain with calf compression.   Dermatological Examination: Skin with normal turgor, texture and tone b/l.  Toenails 1-5 b/l discolored, thick, dystrophic with subungual debris and pain with palpation to nailbeds due to thickness of nails.  Hyperkeratotic lesion submet head 5 b/l with tenderness to palpation. No edema, no erythema, no drainage, no flocculence.  Musculoskeletal: Muscle strength 5/5 to all LE muscle groups b/l.  Neurological Examination: Protective sensation intact with 10 gram monofilament bilaterally.  Assessment: Painful onychomycosis toenails 1-5 b/l  Calluses submet head 5 b/l NIDDM with ESRD  Plan: 1. Toenails 1-5 b/l were debrided in length and girth without iatrogenic bleeding. 2. Calluses pared submetatarsal head 5 b/l utilizing sterile scalpel blade without incident. 3. Patient to continue soft, supportive shoe gear daily. 4. Patient to report any pedal injuries to medical professional immediately. 5. Follow up 3 months.  6. Patient/POA to call should there be a concern in the interim.

## 2019-12-16 ENCOUNTER — Inpatient Hospital Stay (HOSPITAL_COMMUNITY)
Admission: EM | Admit: 2019-12-16 | Discharge: 2019-12-20 | DRG: 177 | Disposition: A | Discharge status: Home / Self Care | Payer: Medicare Other | Attending: Internal Medicine | Admitting: Internal Medicine

## 2019-12-16 ENCOUNTER — Emergency Department (HOSPITAL_COMMUNITY): Payer: Medicare Other

## 2019-12-16 ENCOUNTER — Other Ambulatory Visit: Payer: Self-pay

## 2019-12-16 ENCOUNTER — Encounter (HOSPITAL_COMMUNITY): Payer: Self-pay | Admitting: Emergency Medicine

## 2019-12-16 DIAGNOSIS — R410 Disorientation, unspecified: Secondary | ICD-10-CM

## 2019-12-16 DIAGNOSIS — D649 Anemia, unspecified: Secondary | ICD-10-CM | POA: Diagnosis present

## 2019-12-16 DIAGNOSIS — Z992 Dependence on renal dialysis: Secondary | ICD-10-CM | POA: Diagnosis not present

## 2019-12-16 DIAGNOSIS — N2581 Secondary hyperparathyroidism of renal origin: Secondary | ICD-10-CM | POA: Diagnosis present

## 2019-12-16 DIAGNOSIS — E785 Hyperlipidemia, unspecified: Secondary | ICD-10-CM | POA: Diagnosis present

## 2019-12-16 DIAGNOSIS — R4182 Altered mental status, unspecified: Secondary | ICD-10-CM | POA: Diagnosis present

## 2019-12-16 DIAGNOSIS — J1289 Other viral pneumonia: Secondary | ICD-10-CM | POA: Diagnosis not present

## 2019-12-16 DIAGNOSIS — E8889 Other specified metabolic disorders: Secondary | ICD-10-CM | POA: Diagnosis present

## 2019-12-16 DIAGNOSIS — K219 Gastro-esophageal reflux disease without esophagitis: Secondary | ICD-10-CM | POA: Diagnosis present

## 2019-12-16 DIAGNOSIS — E1365 Other specified diabetes mellitus with hyperglycemia: Secondary | ICD-10-CM | POA: Diagnosis present

## 2019-12-16 DIAGNOSIS — E1165 Type 2 diabetes mellitus with hyperglycemia: Secondary | ICD-10-CM | POA: Diagnosis present

## 2019-12-16 DIAGNOSIS — Z79899 Other long term (current) drug therapy: Secondary | ICD-10-CM | POA: Diagnosis not present

## 2019-12-16 DIAGNOSIS — G9341 Metabolic encephalopathy: Secondary | ICD-10-CM | POA: Diagnosis present

## 2019-12-16 DIAGNOSIS — T380X5A Adverse effect of glucocorticoids and synthetic analogues, initial encounter: Secondary | ICD-10-CM | POA: Diagnosis present

## 2019-12-16 DIAGNOSIS — N186 End stage renal disease: Secondary | ICD-10-CM | POA: Diagnosis present

## 2019-12-16 DIAGNOSIS — Z90710 Acquired absence of both cervix and uterus: Secondary | ICD-10-CM

## 2019-12-16 DIAGNOSIS — E1151 Type 2 diabetes mellitus with diabetic peripheral angiopathy without gangrene: Secondary | ICD-10-CM | POA: Diagnosis present

## 2019-12-16 DIAGNOSIS — Z794 Long term (current) use of insulin: Secondary | ICD-10-CM | POA: Diagnosis not present

## 2019-12-16 DIAGNOSIS — J9601 Acute respiratory failure with hypoxia: Secondary | ICD-10-CM | POA: Diagnosis present

## 2019-12-16 DIAGNOSIS — Z23 Encounter for immunization: Secondary | ICD-10-CM | POA: Diagnosis not present

## 2019-12-16 DIAGNOSIS — Z7982 Long term (current) use of aspirin: Secondary | ICD-10-CM

## 2019-12-16 DIAGNOSIS — IMO0002 Reserved for concepts with insufficient information to code with codable children: Secondary | ICD-10-CM | POA: Diagnosis present

## 2019-12-16 DIAGNOSIS — I120 Hypertensive chronic kidney disease with stage 5 chronic kidney disease or end stage renal disease: Secondary | ICD-10-CM | POA: Diagnosis present

## 2019-12-16 DIAGNOSIS — E1122 Type 2 diabetes mellitus with diabetic chronic kidney disease: Secondary | ICD-10-CM | POA: Diagnosis present

## 2019-12-16 DIAGNOSIS — Z833 Family history of diabetes mellitus: Secondary | ICD-10-CM

## 2019-12-16 DIAGNOSIS — U071 COVID-19: Secondary | ICD-10-CM | POA: Diagnosis present

## 2019-12-16 LAB — CBC WITH DIFFERENTIAL/PLATELET
Abs Immature Granulocytes: 0.01 10*3/uL (ref 0.00–0.07)
Basophils Absolute: 0 10*3/uL (ref 0.0–0.1)
Basophils Relative: 0 %
Eosinophils Absolute: 0 10*3/uL (ref 0.0–0.5)
Eosinophils Relative: 0 %
HCT: 34.6 % — ABNORMAL LOW (ref 36.0–46.0)
Hemoglobin: 11.1 g/dL — ABNORMAL LOW (ref 12.0–15.0)
Immature Granulocytes: 0 %
Lymphocytes Relative: 17 %
Lymphs Abs: 0.7 10*3/uL (ref 0.7–4.0)
MCH: 29.4 pg (ref 26.0–34.0)
MCHC: 32.1 g/dL (ref 30.0–36.0)
MCV: 91.5 fL (ref 80.0–100.0)
Monocytes Absolute: 0.5 10*3/uL (ref 0.1–1.0)
Monocytes Relative: 13 %
Neutro Abs: 2.7 10*3/uL (ref 1.7–7.7)
Neutrophils Relative %: 70 %
Platelets: 169 10*3/uL (ref 150–400)
RBC: 3.78 MIL/uL — ABNORMAL LOW (ref 3.87–5.11)
RDW: 14.7 % (ref 11.5–15.5)
WBC: 3.9 10*3/uL — ABNORMAL LOW (ref 4.0–10.5)
nRBC: 0 % (ref 0.0–0.2)

## 2019-12-16 LAB — BASIC METABOLIC PANEL
Anion gap: 14 (ref 5–15)
BUN: 18 mg/dL (ref 8–23)
CO2: 29 mmol/L (ref 22–32)
Calcium: 8.3 mg/dL — ABNORMAL LOW (ref 8.9–10.3)
Chloride: 94 mmol/L — ABNORMAL LOW (ref 98–111)
Creatinine, Ser: 4.95 mg/dL — ABNORMAL HIGH (ref 0.44–1.00)
GFR calc Af Amer: 9 mL/min — ABNORMAL LOW (ref >60)
GFR calc non Af Amer: 8 mL/min — ABNORMAL LOW (ref >60)
Glucose, Bld: 132 mg/dL — ABNORMAL HIGH (ref 70–99)
Potassium: 3.2 mmol/L — ABNORMAL LOW (ref 3.5–5.1)
Sodium: 137 mmol/L (ref 135–145)

## 2019-12-16 LAB — CBG MONITORING, ED
Glucose-Capillary: 145 mg/dL — ABNORMAL HIGH (ref 70–99)
Glucose-Capillary: 149 mg/dL — ABNORMAL HIGH (ref 70–99)

## 2019-12-16 LAB — SARS CORONAVIRUS 2 (TAT 6-24 HRS): SARS Coronavirus 2: POSITIVE — AB

## 2019-12-16 LAB — LACTIC ACID, PLASMA: Lactic Acid, Venous: 1.1 mmol/L (ref 0.5–1.9)

## 2019-12-16 MED ORDER — HEPARIN SODIUM (PORCINE) 5000 UNIT/ML IJ SOLN
5000.0000 [IU] | Freq: Three times a day (TID) | INTRAMUSCULAR | Status: DC
  Administered 2019-12-17 – 2019-12-20 (×9): 5000 [IU] via SUBCUTANEOUS
  Filled 2019-12-16 (×9): qty 1

## 2019-12-16 MED ORDER — GUAIFENESIN-DM 100-10 MG/5ML PO SYRP: 10.0000 mL | ORAL_SOLUTION | ORAL | Status: DC | PRN

## 2019-12-16 MED ORDER — IOHEXOL 350 MG/ML SOLN
100.0000 mL | Freq: Once | INTRAVENOUS | Status: AC | PRN
  Administered 2019-12-16: 100 mL via INTRAVENOUS

## 2019-12-16 MED ORDER — HYDROCOD POLST-CPM POLST ER 10-8 MG/5ML PO SUER: 5.0000 mL | Freq: Two times a day (BID) | ORAL | Status: DC | PRN

## 2019-12-16 MED ORDER — INSULIN NPH (HUMAN) (ISOPHANE) 100 UNIT/ML ~~LOC~~ SUSP
10.0000 [IU] | Freq: Two times a day (BID) | SUBCUTANEOUS | Status: DC
  Filled 2019-12-16: qty 10

## 2019-12-16 MED ORDER — ACETAMINOPHEN 500 MG PO TABS
1000.0000 mg | ORAL_TABLET | Freq: Once | ORAL | Status: AC
  Administered 2019-12-16: 1000 mg via ORAL
  Filled 2019-12-16: qty 2

## 2019-12-16 MED ORDER — ACETAMINOPHEN 325 MG PO TABS
650.0000 mg | ORAL_TABLET | Freq: Four times a day (QID) | ORAL | Status: DC | PRN
  Administered 2019-12-17 – 2019-12-18 (×2): 650 mg via ORAL
  Filled 2019-12-16 (×2): qty 2

## 2019-12-16 MED ORDER — SODIUM CHLORIDE 0.9 % IV SOLN
200.0000 mg | Freq: Once | INTRAVENOUS | Status: AC
  Administered 2019-12-17: 200 mg via INTRAVENOUS
  Filled 2019-12-16: qty 200

## 2019-12-16 MED ORDER — INSULIN ASPART 100 UNIT/ML ~~LOC~~ SOLN
0.0000 [IU] | SUBCUTANEOUS | Status: DC
  Administered 2019-12-17: 1 [IU] via SUBCUTANEOUS

## 2019-12-16 MED ORDER — SODIUM CHLORIDE 0.9 % IV SOLN
100.0000 mg | Freq: Every day | INTRAVENOUS | Status: AC
  Administered 2019-12-17 – 2019-12-19 (×4): 100 mg via INTRAVENOUS
  Filled 2019-12-16: qty 100
  Filled 2019-12-16 (×3): qty 20

## 2019-12-16 MED ORDER — ONDANSETRON HCL 4 MG/2ML IJ SOLN: 4.0000 mg | Freq: Four times a day (QID) | INTRAMUSCULAR | Status: DC | PRN

## 2019-12-16 MED ORDER — ONDANSETRON HCL 4 MG PO TABS: 4.0000 mg | ORAL_TABLET | Freq: Four times a day (QID) | ORAL | Status: DC | PRN

## 2019-12-16 NOTE — ED Notes (Signed)
ED Provider at bedside. 

## 2019-12-16 NOTE — ED Provider Notes (Signed)
[facility] EMERGENCY DEPARTMENT Provider Note   CSN: [id number] Arrival date & time: 12/16/19  1021     History Chief Complaint  Patient presents with  . Altered Mental Status    Katie Garcia is a 78 y.o. female.  HPI Patient presents to the emergency department with altered mental status from dialysis.  The patient was at dialysis and they stated that she came into the facility alert and oriented x4 and then midway through her dialysis became confused and altered.  The patient was sent here for further evaluation.  The patient is unable to give any history as she does appear confused.  She does states she is at the hospital.  The patient does not have any complaints on general questioning.  In speaking with the patient's daughter she states her mom does get confused from time to time during her dialysis.  The dialysis center was concerned because she has a daughter that has Covid and they felt that this was warranting further evaluation.    Past Medical History:  Diagnosis Date  . Anemia   . Arthritis   . Chronic kidney disease   . Diabetes mellitus without complication ([place])   . GERD (gastroesophageal reflux disease)   . Headache   . Hypertension     Patient Active Problem List   Diagnosis Date Noted  . Chronic kidney disease (CKD), stage V ([place]) 10/28/2018  . Gout 10/28/2018  . HLD (hyperlipidemia) 10/28/2018  . Hyperparathyroidism ([place]) 10/28/2018  . Pelvic kidney 10/28/2018  . Pre-transplant evaluation for CKD (chronic kidney disease) 10/28/2018  . Proteinuria 10/28/2018  . Vertigo 10/28/2018  . Ingrown nail of great toe of left foot 10/10/2018  . Murmur, cardiac 04/02/2018  . Iron deficiency anemia 02/23/2017  . Anemia in chronic kidney disease 02/09/2017  . Drug noncompliance 03/04/2014  . Diarrhea 03/04/2014  . Acute renal failure ([place]) 03/04/2014  . DM (diabetes mellitus), secondary uncontrolled ([place]) 03/04/2014  . ARF (acute renal  failure) ([place]) 03/04/2014  . Renal failure, acute ([place]) 03/04/2014    Past Surgical History:  Procedure Laterality Date  . ABDOMINAL HYSTERECTOMY    . AV FISTULA PLACEMENT Left 10/02/2016   Procedure: ARTERIOVENOUS (AV) FISTULA CREATION LEFT UPPER ARM;  Surgeon: Waynetta Sandy, MD;  Location: [facility];  Service: Vascular;  Laterality: Left;  . CESAREAN SECTION       OB History   No obstetric history on file.     Family History  Problem Relation Age of Onset  . Diabetes Mother     Social History   Tobacco Use  . Smoking status: Never Smoker  . Smokeless tobacco: Never Used  Substance Use Topics  . Alcohol use: No  . Drug use: No    Home Medications Prior to Admission medications   Medication Sig Start Date End Date Taking? Authorizing Provider  ACCU-CHEK GUIDE test strip USE TO CHECK BG UP TO 4 TIMES A DAY (DX: E11.22 - DIABETES WITH CKD AND INSULIN THERAPY) IN VITRO 10/06/18   [provider]  allopurinol (ZYLOPRIM) 100 MG tablet Take 200 mg by mouth daily.     [provider]  amLODipine (NORVASC) 10 MG tablet Take 10 mg by mouth daily.    [provider]  aspirin 81 MG tablet Take 81 mg by mouth daily.    [provider]  B Complex-C-Zn-Folic Acid (DIALYVITE/ZINC) TABS every evening. 10/25/18   [provider]  calcitRIOL (ROCALTROL) 0.25 MCG capsule Take 0.25  mcg by mouth daily.    [provider]  calcium acetate (PHOSLO) 667 MG capsule TAKE 1 CAPSULE BY MOUTH THREE TIMES A DAY WITH MEALS TAKE 1 WITH EVERY MEAL 10/24/18   [provider]  Calcium Carbonate Antacid (TUMS E-X PO) Take by mouth.    [provider]  carvedilol (COREG) 25 MG tablet Take 25 mg by mouth 2 (two) times daily with a meal.    [provider]  Dextromethorphan-guaiFENesin (MUCINEX DM) 30-600 MG TB12 TAKE 1 TABLET BY MOUTH EVERY 12 HOURS FOR 7 DAYS AS NEEDED 05/02/19   [provider]  escitalopram  (LEXAPRO) 5 MG tablet  04/27/19   [provider]  esomeprazole (NEXIUM) 20 MG capsule Take by mouth daily. 06/11/19   [provider]  famotidine (PEPCID) 20 MG tablet Take 20 mg by mouth daily. 10/21/19   [provider]  furosemide (LASIX) 40 MG tablet Take 80 mg by mouth daily.     [provider]  insulin NPH-regular Human (HUMULIN 70/30) (70-30) 100 UNIT/ML injection Inject 26 Units into the skin daily with breakfast. Inject 26 Units into the skin daily with breakfast. Patient taking differently: Inject 32 Units into the skin daily with breakfast. Inject 32 Units into the skin daily with breakfast. 08/26/19   Renato Shin, MD  Iron-FA-B Cmp-C-Biot-Probiotic (FUSION PLUS PO) Take by mouth.    [provider]  lisinopril (ZESTRIL) 10 MG tablet TAKE 1 TABLET BY MOUTH EVERY DAY AT BEDTIME AS DIRECTED 02/27/19   [provider]  loratadine (CLARITIN) 10 MG tablet Take 10 mg by mouth daily. 04/01/19   [provider]  losartan (COZAAR) 25 MG tablet Take 25 mg by mouth daily. 11/03/19   [provider]  meclizine (ANTIVERT) 12.5 MG tablet Take 1 tablet (12.5 mg total) by mouth 3 (three) times daily as needed for dizziness. 06/26/16   Kirichenko, Tatyana, PA-C  nystatin (MYCOSTATIN) 100000 UNIT/ML suspension TAKE 4 ML BY MOUTH FOUR TIMES A DAY FOR 10 DAYS 04/21/19   [provider]  omeprazole (PRILOSEC) 20 MG capsule Take 1 capsule (20 mg total) by mouth daily. 06/26/16   Kirichenko, Lahoma Rocker, PA-C  RELION INSULIN SYR 0.5ML/31G 31G X 5/16" 0.5 ML MISC USE 1 ONCE DAILY 03/05/19   [provider]  rosuvastatin (CRESTOR) 10 MG tablet Take 10 mg by mouth every Monday, Wednesday, and Friday.    [provider]  triamcinolone cream (KENALOG) 0.1 % APPLY CREAM EXTERNALLY TO AFFECTED AREA UP TO TWICE DAILY AS NEEDED (NOT TO FACE GROIN AXILLA) 05/28/19   [provider]    Allergies    Penicillins  Review of Systems    Review of Systems Level 5 caveat applies due to altered mental status Physical Exam Updated Vital Signs BP (!) 152/85   Pulse 85   Temp (!) 101.9 F (38.8 C) (Rectal)   Resp 16   SpO2 99%   Physical Exam Vitals and nursing note reviewed.  Constitutional:      General: She is not in acute distress.    Appearance: She is well-developed.  HENT:     Head: Normocephalic and atraumatic.  Eyes:     Pupils: Pupils are equal, round, and reactive to light.  Cardiovascular:     Rate and Rhythm: Normal rate and regular rhythm.     Heart sounds: Normal heart sounds. No murmur. No friction rub. No gallop.   Pulmonary:     Effort: Pulmonary effort is normal. No  respiratory distress.     Breath sounds: Normal breath sounds. No wheezing.  Abdominal:     General: Bowel sounds are normal. There is no distension.     Palpations: Abdomen is soft.     Tenderness: There is no abdominal tenderness.  Musculoskeletal:     Cervical back: Normal range of motion and neck supple.  Skin:    General: Skin is warm and dry.     Capillary Refill: Capillary refill takes less than 2 seconds.     Findings: No erythema or rash.  Neurological:     General: No focal deficit present.     Mental Status: She is alert. She is disoriented.     Motor: No abnormal muscle tone.     Coordination: Coordination normal.  Psychiatric:        Behavior: Behavior normal.     ED Results / Procedures / Treatments   Labs (all labs ordered are listed, but only abnormal results are displayed) Labs Reviewed  BASIC METABOLIC PANEL - Abnormal; Notable for the following components:      Result Value   Potassium 3.2 (*)    Chloride 94 (*)    Glucose, Bld 132 (*)    Creatinine, Ser 4.95 (*)    Calcium 8.3 (*)    GFR calc non Af Amer 8 (*)    GFR calc Af Amer 9 (*)    All other components within normal limits  CBC WITH DIFFERENTIAL/PLATELET - Abnormal; Notable for the following components:   WBC 3.9 (*)    RBC 3.78 (*)      Hemoglobin 11.1 (*)    HCT 34.6 (*)    All other components within normal limits  SARS CORONAVIRUS 2 (TAT 6-24 HRS)  LACTIC ACID, PLASMA  LACTIC ACID, PLASMA    EKG EKG Interpretation  Date/Time:  Tuesday December 16 2019 10:32:54 EST Ventricular Rate:  85 PR Interval:    QRS Duration: 80 QT Interval:  376 QTC Calculation: 448 R Axis:   39 Text Interpretation: Sinus rhythm No significant change since last tracing Confirmed by Deno Etienne [phone]) on 12/16/2019 10:38:18 AM   Radiology No results found.  Procedures Procedures (including critical care time)  Medications Ordered in ED Medications - No data to display  ED Course  I have reviewed the triage vital signs and the nursing notes.  Pertinent labs & imaging results that were available during my care of the patient were reviewed by me and considered in my medical decision making (see chart for details).    MDM Rules/Calculators/A&P                      Patient does have a fever at this time.  The patient can only tell me her birthdate and repeats this several times and ask other questions.  I did speak with neurology who advised that we need to get a CT angio head and neck along with the perfusion scan.  Patient has had no other symptoms here in the emergency department has no other complaints at this time. Final Clinical Impression(s) / ED Diagnoses Final diagnoses:  None    Rx / DC Orders ED Discharge Orders    None       Dalia Heading, PA-C 12/16/19 [place], DO 12/16/19 1546

## 2019-12-16 NOTE — ED Provider Notes (Signed)
Patient received in sign out from [facility], [place]. Patient is a dialysis patient, sudden onset of altered mental status during dialysis today. Patient sent to ED for evaluation. Patient noted to be febrile upon arrival. No obvious source in infection. Patient recently had a negative COVID test, but known exposure to family member that is COVID positive. Initial provider had consulted with neurology who recommended CT angio of head/neck and perfusion scan. No acute findings. On reassessment, patient remains confused to time and events. No neck pain/stiffness/meningismus. Normal lactate. No leukocytosis. At present, fever of unknown origin. Suspect COVID. Patient without increased work of breathing, normal oxygen saturations on room air.  Will request admission.  Patient admitted to hospitalist service.  Results for orders placed or performed during the hospital encounter of [id number]  Basic metabolic panel  Result Value Ref Range   Sodium 137 135 - 145 mmol/L   Potassium 3.2 (L) 3.5 - 5.1 mmol/L   Chloride 94 (L) 98 - 111 mmol/L   CO2 29 22 - 32 mmol/L   Glucose, Bld 132 (H) 70 - 99 mg/dL   BUN 18 8 - 23 mg/dL   Creatinine, Ser 4.95 (H) 0.44 - 1.00 mg/dL   Calcium 8.3 (L) 8.9 - 10.3 mg/dL   GFR calc non Af Amer 8 (L) >60 mL/min   GFR calc Af Amer 9 (L) >60 mL/min   Anion gap 14 5 - 15  CBC with Differential  Result Value Ref Range   WBC 3.9 (L) 4.0 - 10.5 K/uL   RBC 3.78 (L) 3.87 - 5.11 MIL/uL   Hemoglobin 11.1 (L) 12.0 - 15.0 g/dL   HCT 34.6 (L) 36.0 - 46.0 %   MCV 91.5 80.0 - 100.0 fL   MCH 29.4 26.0 - 34.0 pg   MCHC 32.1 30.0 - 36.0 g/dL   RDW 14.7 11.5 - 15.5 %   Platelets 169 150 - 400 K/uL   nRBC 0.0 0.0 - 0.2 %   Neutrophils Relative % 70 %   Neutro Abs 2.7 1.7 - 7.7 K/uL   Lymphocytes Relative 17 %   Lymphs Abs 0.7 0.7 - 4.0 K/uL   Monocytes Relative 13 %   Monocytes Absolute 0.5 0.1 - 1.0 K/uL   Eosinophils Relative 0 %   Eosinophils Absolute 0.0 0.0 - 0.5 K/uL    Basophils Relative 0 %   Basophils Absolute 0.0 0.0 - 0.1 K/uL   Immature Granulocytes 0 %   Abs Immature Granulocytes 0.01 0.00 - 0.07 K/uL  Lactic acid, plasma  Result Value Ref Range   Lactic Acid, Venous 1.1 0.5 - 1.9 mmol/L   CT Angio Head W or Wo Contrast  Result Date: 12/16/2019 CLINICAL DATA:  Stroke. Progressive acute mental status changes during dialysis. EXAM: CT ANGIOGRAPHY HEAD AND NECK CT PERFUSION BRAIN TECHNIQUE: Multidetector CT imaging of the head and neck was performed using the standard protocol during bolus administration of intravenous contrast. Multiplanar CT image reconstructions and MIPs were obtained to evaluate the vascular anatomy. Carotid stenosis measurements (when applicable) are obtained utilizing NASCET criteria, using the distal internal carotid diameter as the denominator. Multiphase CT imaging of the brain was performed following IV bolus contrast injection. Subsequent parametric perfusion maps were calculated using RAPID software. CONTRAST:  178mL OMNIPAQUE IOHEXOL 350 MG/ML SOLN COMPARISON:  MR head without contrast 06/26/2016 FINDINGS: CT HEAD FINDINGS Brain: No acute infarct, hemorrhage, or mass lesion is present. The ventricles are of normal size. No significant white matter lesions are present. The  brainstem and cerebellum are within normal limits. Vascular: Atherosclerotic calcifications are present within the cavernous internal carotid arteries bilaterally. There is no hyperdense vessel. Skull: Calvarium is intact. No focal lytic or blastic lesions are present. No significant extracranial soft tissue lesion is present. Sinuses/Orbits: The paranasal sinuses and mastoid air cells are clear. The globes and orbits are within normal limits. ASPECTS ([place] Stroke Program Early CT Score) - Ganglionic level infarction (caudate, lentiform nuclei, internal capsule, insula, M1-M3 cortex): 7/7 - Supraganglionic infarction (M4-M6 cortex): 3/3 Total score (0-10 with 10  being normal): 10/10 Review of the MIP images confirms the above findings CTA NECK FINDINGS Aortic arch: A 3 vessel arch configuration is present. There is no significant atherosclerotic disease or stenosis. There is no aneurysm. Right carotid system: The proximal right common carotid artery is obscured by motion artifact. There is no significant stenosis in the right common carotid artery. Right MCA bifurcation is within normal limits. Cervical right ICA is normal. Left carotid system: The left common carotid artery is somewhat distorted by motion. No focal stenosis is present. Atherosclerotic calcifications are present at the bifurcation without significant stenosis. Cervical left ICA is otherwise normal. Vertebral arteries: The right vertebral artery is the dominant vessel. There is some distortion at the C4-5 level. No other significant stenosis is present. Vertebral artery origins are within normal limits bilaterally. Skeleton: Is straightening and reversal of some the normal cervical lordosis. 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 Grade 1 retrolisthesis is likely exaggerated by artifact at C4-5. Focal lytic or blastic lesions are present. Other neck: The soft tissues the neck are otherwise unremarkable. Upper chest: Posterior airspace opacities are present on the right. The left lung is clear. Thoracic inlet is normal. Review of the MIP images confirms the above findings CTA HEAD FINDINGS Anterior circulation: Atherosclerotic calcifications are present within the cavernous internal carotid arteries bilaterally without a significant stenosis relative to the more distal vessels. ICA termini are normal bilaterally. The left A1 segment is dominant. The right A1 segment is hypoplastic or occluded. The anterior communicating artery is patent and both A2 segments fill. M1 segments are normal bilaterally. The MCA bifurcations are intact. There is some irregularity of distal ACA and  MCA branch vessels without a significant proximal stenosis or occlusion. No aneurysm is present. Posterior circulation: The right vertebral artery is the dominant vessel. The left vertebral artery terminates at the PICA. The right AICA vessel is dominant. The basilar artery is normal. Both posterior cerebral arteries originate from the basilar tip. There is mild irregularity of the left P2 segment with a 50% stenosis. Mild irregularity is present in the distal PCA branch vessels without other significant proximal stenosis or occlusion. Venous sinuses: The dural sinuses are patent. The straight sinus and deep cerebral veins are intact. Cortical veins are within normal limits. Anatomic variants: None Review of the MIP images confirms the above findings CT Brain Perfusion Findings: ASPECTS: 10/10 CBF (<30%) Volume: 71mL Perfusion (Tmax>6.0s) volume: 12mL Mismatch Volume: 43mL Infarction Location:n/a IMPRESSION: 1. CT perfusion demonstrates no acute infarct or significant ischemia. 2. No emergent large vessel occlusion. 3. Absence of the right A1 segment is likely due to a hypoplastic or chronically occluded vessel. Anterior communicating artery and both A2 segments are normal. 4. Atherosclerotic changes are noted at the left carotid bifurcation and cavernous internal carotid arteries bilaterally without significant stenosis. 5. Hypoplastic left vertebral artery terminates at the PICA. 6. Mild narrowing of the proximal left PCA. 7.  Mild diffuse distal small vessel disease without significant proximal stenosis or occlusion elsewhere within the circle[place]. These results were called by telephone at the time of interpretation on 12/16/2019 at 4:45 pm to provider [facility] , who verbally acknowledged these results. Electronically Signed   By: San Morelle M.D.   On: 12/16/2019 16:46   DG Chest 2 View  Result Date: 12/16/2019 CLINICAL DATA:  Altered mental status. EXAM: CHEST - 2 VIEW COMPARISON:  PA  and lateral chest 06/26/2016. FINDINGS: Heart size is upper normal with pulmonary vascular congestion. No consolidative process, pneumothorax or effusion. No acute or focal bony abnormality. IMPRESSION: Pulmonary vascular congestion.  No acute disease. Electronically Signed   By: Inge Rise M.D.   On: 12/16/2019 15:58   CT Angio Neck W and/or Wo Contrast  Result Date: 12/16/2019 CLINICAL DATA:  Stroke. Progressive acute mental status changes during dialysis. EXAM: CT ANGIOGRAPHY HEAD AND NECK CT PERFUSION BRAIN TECHNIQUE: Multidetector CT imaging of the head and neck was performed using the standard protocol during bolus administration of intravenous contrast. Multiplanar CT image reconstructions and MIPs were obtained to evaluate the vascular anatomy. Carotid stenosis measurements (when applicable) are obtained utilizing NASCET criteria, using the distal internal carotid diameter as the denominator. Multiphase CT imaging of the brain was performed following IV bolus contrast injection. Subsequent parametric perfusion maps were calculated using RAPID software. CONTRAST:  158mL OMNIPAQUE IOHEXOL 350 MG/ML SOLN COMPARISON:  MR head without contrast 06/26/2016 FINDINGS: CT HEAD FINDINGS Brain: No acute infarct, hemorrhage, or mass lesion is present. The ventricles are of normal size. No significant white matter lesions are present. The brainstem and cerebellum are within normal limits. Vascular: Atherosclerotic calcifications are present within the cavernous internal carotid arteries bilaterally. There is no hyperdense vessel. Skull: Calvarium is intact. No focal lytic or blastic lesions are present. No significant extracranial soft tissue lesion is present. Sinuses/Orbits: The paranasal sinuses and mastoid air cells are clear. The globes and orbits are within normal limits. ASPECTS ([place] Stroke Program Early CT Score) - Ganglionic level infarction (caudate, lentiform nuclei, internal capsule, insula,  M1-M3 cortex): 7/7 - Supraganglionic infarction (M4-M6 cortex): 3/3 Total score (0-10 with 10 being normal): 10/10 Review of the MIP images confirms the above findings CTA NECK FINDINGS Aortic arch: A 3 vessel arch configuration is present. There is no significant atherosclerotic disease or stenosis. There is no aneurysm. Right carotid system: The proximal right common carotid artery is obscured by motion artifact. There is no significant stenosis in the right common carotid artery. Right MCA bifurcation is within normal limits. Cervical right ICA is normal. Left carotid system: The left common carotid artery is somewhat distorted by motion. No focal stenosis is present. Atherosclerotic calcifications are present at the bifurcation without significant stenosis. Cervical left ICA is otherwise normal. Vertebral arteries: The right vertebral artery is the dominant vessel. There is some distortion at the C4-5 level. No other significant stenosis is present. Vertebral artery origins are within normal limits bilaterally. Skeleton: Is straightening and reversal of some the normal cervical lordosis. 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 Grade 1 retrolisthesis is likely exaggerated by artifact at C4-5. Focal lytic or blastic lesions are present. Other neck: The soft tissues the neck are otherwise unremarkable. Upper chest: Posterior airspace opacities are present on the right. The left lung is clear. Thoracic inlet is normal. Review of the MIP images confirms the above findings CTA HEAD FINDINGS Anterior circulation: Atherosclerotic calcifications are present within  the cavernous internal carotid arteries bilaterally without a significant stenosis relative to the more distal vessels. ICA termini are normal bilaterally. The left A1 segment is dominant. The right A1 segment is hypoplastic or occluded. The anterior communicating artery is patent and both A2 segments fill. M1 segments are  normal bilaterally. The MCA bifurcations are intact. There is some irregularity of distal ACA and MCA branch vessels without a significant proximal stenosis or occlusion. No aneurysm is present. Posterior circulation: The right vertebral artery is the dominant vessel. The left vertebral artery terminates at the PICA. The right AICA vessel is dominant. The basilar artery is normal. Both posterior cerebral arteries originate from the basilar tip. There is mild irregularity of the left P2 segment with a 50% stenosis. Mild irregularity is present in the distal PCA branch vessels without other significant proximal stenosis or occlusion. Venous sinuses: The dural sinuses are patent. The straight sinus and deep cerebral veins are intact. Cortical veins are within normal limits. Anatomic variants: None Review of the MIP images confirms the above findings CT Brain Perfusion Findings: ASPECTS: 10/10 CBF (<30%) Volume: 54mL Perfusion (Tmax>6.0s) volume: 71mL Mismatch Volume: 48mL Infarction Location:n/a IMPRESSION: 1. CT perfusion demonstrates no acute infarct or significant ischemia. 2. No emergent large vessel occlusion. 3. Absence of the right A1 segment is likely due to a hypoplastic or chronically occluded vessel. Anterior communicating artery and both A2 segments are normal. 4. Atherosclerotic changes are noted at the left carotid bifurcation and cavernous internal carotid arteries bilaterally without significant stenosis. 5. Hypoplastic left vertebral artery terminates at the PICA. 6. Mild narrowing of the proximal left PCA. 7. Mild diffuse distal small vessel disease without significant proximal stenosis or occlusion elsewhere within the circle[place]of[place]. These results were called by telephone at the time of interpretation on 12/16/2019 at 4:45 pm to provider [facility] , who verbally acknowledged these results. Electronically Signed   By: San Morelle M.D.   On: 12/16/2019 16:46   CT CEREBRAL PERFUSION  W CONTRAST  Result Date: 12/16/2019 CLINICAL DATA:  Stroke. Progressive acute mental status changes during dialysis. EXAM: CT ANGIOGRAPHY HEAD AND NECK CT PERFUSION BRAIN TECHNIQUE: Multidetector CT imaging of the head and neck was performed using the standard protocol during bolus administration of intravenous contrast. Multiplanar CT image reconstructions and MIPs were obtained to evaluate the vascular anatomy. Carotid stenosis measurements (when applicable) are obtained utilizing NASCET criteria, using the distal internal carotid diameter as the denominator. Multiphase CT imaging of the brain was performed following IV bolus contrast injection. Subsequent parametric perfusion maps were calculated using RAPID software. CONTRAST:  163mL OMNIPAQUE IOHEXOL 350 MG/ML SOLN COMPARISON:  MR head without contrast 06/26/2016 FINDINGS: CT HEAD FINDINGS Brain: No acute infarct, hemorrhage, or mass lesion is present. The ventricles are of normal size. No significant white matter lesions are present. The brainstem and cerebellum are within normal limits. Vascular: Atherosclerotic calcifications are present within the cavernous internal carotid arteries bilaterally. There is no hyperdense vessel. Skull: Calvarium is intact. No focal lytic or blastic lesions are present. No significant extracranial soft tissue lesion is present. Sinuses/Orbits: The paranasal sinuses and mastoid air cells are clear. The globes and orbits are within normal limits. ASPECTS ([place] Stroke Program Early CT Score) - Ganglionic level infarction (caudate, lentiform nuclei, internal capsule, insula, M1-M3 cortex): 7/7 - Supraganglionic infarction (M4-M6 cortex): 3/3 Total score (0-10 with 10 being normal): 10/10 Review of the MIP images confirms the above findings CTA NECK FINDINGS Aortic arch: A 3 vessel arch  configuration is present. There is no significant atherosclerotic disease or stenosis. There is no aneurysm. Right carotid system: The  proximal right common carotid artery is obscured by motion artifact. There is no significant stenosis in the right common carotid artery. Right MCA bifurcation is within normal limits. Cervical right ICA is normal. Left carotid system: The left common carotid artery is somewhat distorted by motion. No focal stenosis is present. Atherosclerotic calcifications are present at the bifurcation without significant stenosis. Cervical left ICA is otherwise normal. Vertebral arteries: The right vertebral artery is the dominant vessel. There is some distortion at the C4-5 level. No other significant stenosis is present. Vertebral artery origins are within normal limits bilaterally. Skeleton: Is straightening and reversal of some the normal cervical lordosis. 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 Grade 1 retrolisthesis is likely exaggerated by artifact at C4-5. Focal lytic or blastic lesions are present. Other neck: The soft tissues the neck are otherwise unremarkable. Upper chest: Posterior airspace opacities are present on the right. The left lung is clear. Thoracic inlet is normal. Review of the MIP images confirms the above findings CTA HEAD FINDINGS Anterior circulation: Atherosclerotic calcifications are present within the cavernous internal carotid arteries bilaterally without a significant stenosis relative to the more distal vessels. ICA termini are normal bilaterally. The left A1 segment is dominant. The right A1 segment is hypoplastic or occluded. The anterior communicating artery is patent and both A2 segments fill. M1 segments are normal bilaterally. The MCA bifurcations are intact. There is some irregularity of distal ACA and MCA branch vessels without a significant proximal stenosis or occlusion. No aneurysm is present. Posterior circulation: The right vertebral artery is the dominant vessel. The left vertebral artery terminates at the PICA. The right AICA vessel is dominant.  The basilar artery is normal. Both posterior cerebral arteries originate from the basilar tip. There is mild irregularity of the left P2 segment with a 50% stenosis. Mild irregularity is present in the distal PCA branch vessels without other significant proximal stenosis or occlusion. Venous sinuses: The dural sinuses are patent. The straight sinus and deep cerebral veins are intact. Cortical veins are within normal limits. Anatomic variants: None Review of the MIP images confirms the above findings CT Brain Perfusion Findings: ASPECTS: 10/10 CBF (<30%) Volume: 44mL Perfusion (Tmax>6.0s) volume: 54mL Mismatch Volume: 9mL Infarction Location:n/a IMPRESSION: 1. CT perfusion demonstrates no acute infarct or significant ischemia. 2. No emergent large vessel occlusion. 3. Absence of the right A1 segment is likely due to a hypoplastic or chronically occluded vessel. Anterior communicating artery and both A2 segments are normal. 4. Atherosclerotic changes are noted at the left carotid bifurcation and cavernous internal carotid arteries bilaterally without significant stenosis. 5. Hypoplastic left vertebral artery terminates at the PICA. 6. Mild narrowing of the proximal left PCA. 7. Mild diffuse distal small vessel disease without significant proximal stenosis or occlusion elsewhere within the circle[place]of-[place]. These results were called by telephone at the time of interpretation on 12/16/2019 at 4:45 pm to provider [facility] , who verbally acknowledged these results. Electronically Signed   By: San Morelle M.D.   On: 12/16/2019 16:46          Etta Quill, NP 12/16/19 OR:[id number]    Daleen Bo, MD 12/19/19 [phone]

## 2019-12-16 NOTE — Progress Notes (Signed)
LUA graft deaccessed, pressure dressing applied for 25 minutes, RN at bedside , aware to check site for bleeding. No bleeding noted so far. IV team

## 2019-12-16 NOTE — ED Notes (Signed)
IV team at bedside 

## 2019-12-16 NOTE — ED Notes (Signed)
Patient standing at the end of her bed, this RN redirected patient back onto stretcher, patient provided warm blanket and educated on importance of calling for help if she needs to get up.

## 2019-12-16 NOTE — ED Notes (Signed)
Patient transported to X-ray 

## 2019-12-16 NOTE — ED Notes (Signed)
Unable to obtain labs with iv start, will consult phlebotomy.

## 2019-12-16 NOTE — ED Notes (Signed)
Per patient's daughter, her and her sister both are admitted with covid, patient had negative test on Thursday. [employer] Lawyer made aware.

## 2019-12-16 NOTE — ED Notes (Signed)
Did rectal temp on patient it was 101.9

## 2019-12-16 NOTE — ED Notes (Addendum)
ED TO INPATIENT HANDOFF REPORT  ED Nurse Name and Phone #: William Hamburger, RN [phone]  S Name/Age/Gender Katie Garcia 79 y.o. female Room/Bed: 037C/037C  Code Status   Code Status: Prior  Home/SNF/Other Home Patient oriented to: self, place Is this baseline? No   Triage Complete: Triage complete  Chief Complaint ams  Triage Note Pt arrives via ptar from dialysis for AMS, pt is normally a/ox4 at baseline. Upon starting dialysis they reported that patient was at baseline but throughout her session she became disoriented. Dialysis reported that patient finished her session today and that patient's daughter also recently tested positive for covid and is in the hospital. Pt alert and oriented to person and place only. resp e/u, nad. Speech clear, face symmetrical, moves all limbs equally.     Allergies Allergies  Allergen Reactions  . Penicillins Rash    Did it involve swelling of the face/tongue/throat, SOB, or low BP? No Did it involve sudden or severe rash/hives, skin peeling, or any reaction on the inside of your mouth or nose? Yes Did you need to seek medical attention at a hospital or doctor's office? N/A When did it last happen?N/A If all above answers are "NO", may proceed with cephalosporin use.    Level of Care/Admitting Diagnosis ED Disposition    None      B Medical/Surgery History Past Medical History:  Diagnosis Date  . Anemia   . Arthritis   . Chronic kidney disease   . Diabetes mellitus without complication ([place])   . GERD (gastroesophageal reflux disease)   . Headache   . Hypertension    Past Surgical History:  Procedure Laterality Date  . ABDOMINAL HYSTERECTOMY    . AV FISTULA PLACEMENT Left 10/02/2016   Procedure: ARTERIOVENOUS (AV) FISTULA CREATION LEFT UPPER ARM;  Surgeon: Waynetta Sandy, MD;  Location: [facility];  Service: Vascular;  Laterality: Left;  . CESAREAN SECTION       A IV Location/Drains/Wounds Patient  Lines/Drains/Airways Status   Active Line/Drains/Airways    Name:   Placement date:   Placement time:   Site:   Days:   Peripheral IV 12/16/19 Right Hand   12/16/19    1047    Hand   less than 1   Peripheral IV 12/16/19 Right Antecubital   12/16/19    1132    Antecubital   less than 1   Peripheral IV 12/16/19 Right Antecubital   12/16/19    1345    Antecubital   less than 1   Fistula / Graft Left Upper arm Arteriovenous fistula   10/02/16    0759    Upper arm   1170   Incision (Closed) 10/02/16 Arm Left   10/02/16    0759     1170          Intake/Output Last 24 hours No intake or output data in the 24 hours ending 12/16/19 1945  Labs/Imaging Results for orders placed or performed during the hospital encounter of 12/16/19 (from the past 48 hour(s))  Basic metabolic panel     Status: Abnormal   Collection Time: 12/16/19 11:25 AM  Result Value Ref Range   Sodium 137 135 - 145 mmol/L   Potassium 3.2 (L) 3.5 - 5.1 mmol/L   Chloride 94 (L) 98 - 111 mmol/L   CO2 29 22 - 32 mmol/L   Glucose, Bld 132 (H) 70 - 99 mg/dL   BUN 18 8 - 23 mg/dL   Creatinine, Ser 4.95 (  H) 0.44 - 1.00 mg/dL   Calcium 8.3 (L) 8.9 - 10.3 mg/dL   GFR calc non Af Amer 8 (L) >60 mL/min   GFR calc Af Amer 9 (L) >60 mL/min   Anion gap 14 5 - 15    Comment: Performed at [facility] [address] [place], [place] [address]  CBC with Differential     Status: Abnormal   Collection Time: 12/16/19 11:25 AM  Result Value Ref Range   WBC 3.9 (L) 4.0 - 10.5 K/uL   RBC 3.78 (L) 3.87 - 5.11 MIL/uL   Hemoglobin 11.1 (L) 12.0 - 15.0 g/dL   HCT 34.6 (L) 36.0 - 46.0 %   MCV 91.5 80.0 - 100.0 fL   MCH 29.4 26.0 - 34.0 pg   MCHC 32.1 30.0 - 36.0 g/dL   RDW 14.7 11.5 - 15.5 %   Platelets 169 150 - 400 K/uL   nRBC 0.0 0.0 - 0.2 %   Neutrophils Relative % 70 %   Neutro Abs 2.7 1.7 - 7.7 K/uL   Lymphocytes Relative 17 %   Lymphs Abs 0.7 0.7 - 4.0 K/uL   Monocytes Relative 13 %   Monocytes Absolute 0.5 0.1 - 1.0 K/uL    Eosinophils Relative 0 %   Eosinophils Absolute 0.0 0.0 - 0.5 K/uL   Basophils Relative 0 %   Basophils Absolute 0.0 0.0 - 0.1 K/uL   Immature Granulocytes 0 %   Abs Immature Granulocytes 0.01 0.00 - 0.07 K/uL    Comment: Performed at [facility], [address], [place], [place] [address]  Lactic acid, plasma     Status: None   Collection Time: 12/16/19 11:51 AM  Result Value Ref Range   Lactic Acid, Venous 1.1 0.5 - 1.9 mmol/L    Comment: Performed at [facility] [address], [place], [place] [address]   CT Angio Head W or [place] Contrast  Result Date: 12/16/2019 CLINICAL DATA:  Stroke. Progressive acute mental status changes during dialysis. EXAM: CT ANGIOGRAPHY HEAD AND NECK CT PERFUSION BRAIN TECHNIQUE: Multidetector CT imaging of the head and neck was performed using the standard protocol during bolus administration of intravenous contrast. Multiplanar CT image reconstructions and MIPs were obtained to evaluate the vascular anatomy. Carotid stenosis measurements (when applicable) are obtained utilizing NASCET criteria, using the distal internal carotid diameter as the denominator. Multiphase CT imaging of the brain was performed following IV bolus contrast injection. Subsequent parametric perfusion maps were calculated using RAPID software. CONTRAST:  174mL OMNIPAQUE IOHEXOL 350 MG/ML SOLN COMPARISON:  MR head without contrast 06/26/2016 FINDINGS: CT HEAD FINDINGS Brain: No acute infarct, hemorrhage, or mass lesion is present. The ventricles are of normal size. No significant white matter lesions are present. The brainstem and cerebellum are within normal limits. Vascular: Atherosclerotic calcifications are present within the cavernous internal carotid arteries bilaterally. There is no hyperdense vessel. Skull: Calvarium is intact. No focal lytic or blastic lesions are present. No significant extracranial soft tissue lesion is present. Sinuses/Orbits: The paranasal sinuses and  mastoid air cells are clear. The globes and orbits are within normal limits. ASPECTS ([place] Stroke Program Early CT Score) - Ganglionic level infarction (caudate, lentiform nuclei, internal capsule, insula, M1-M3 cortex): 7/7 - Supraganglionic infarction (M4-M6 cortex): 3/3 Total score (0-10 with 10 being normal): 10/10 Review of the MIP images confirms the above findings CTA NECK FINDINGS Aortic arch: A 3 vessel arch configuration is present. There is no significant atherosclerotic disease  or stenosis. There is no aneurysm. Right carotid system: The proximal right common carotid artery is obscured by motion artifact. There is no significant stenosis in the right common carotid artery. Right MCA bifurcation is within normal limits. Cervical right ICA is normal. Left carotid system: The left common carotid artery is somewhat distorted by motion. No focal stenosis is present. Atherosclerotic calcifications are present at the bifurcation without significant stenosis. Cervical left ICA is otherwise normal. Vertebral arteries: The right vertebral artery is the dominant vessel. There is some distortion at the C4-5 level. No other significant stenosis is present. Vertebral artery origins are within normal limits bilaterally. Skeleton: Is straightening and reversal of some the normal cervical lordosis. 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 Grade 1 retrolisthesis is likely exaggerated by artifact at C4-5. Focal lytic or blastic lesions are present. Other neck: The soft tissues the neck are otherwise unremarkable. Upper chest: Posterior airspace opacities are present on the right. The left lung is clear. Thoracic inlet is normal. Review of the MIP images confirms the above findings CTA HEAD FINDINGS Anterior circulation: Atherosclerotic calcifications are present within the cavernous internal carotid arteries bilaterally without a significant stenosis relative to the more distal vessels.  ICA termini are normal bilaterally. The left A1 segment is dominant. The right A1 segment is hypoplastic or occluded. The anterior communicating artery is patent and both A2 segments fill. M1 segments are normal bilaterally. The MCA bifurcations are intact. There is some irregularity of distal ACA and MCA branch vessels without a significant proximal stenosis or occlusion. No aneurysm is present. Posterior circulation: The right vertebral artery is the dominant vessel. The left vertebral artery terminates at the PICA. The right AICA vessel is dominant. The basilar artery is normal. Both posterior cerebral arteries originate from the basilar tip. There is mild irregularity of the left P2 segment with a 50% stenosis. Mild irregularity is present in the distal PCA branch vessels without other significant proximal stenosis or occlusion. Venous sinuses: The dural sinuses are patent. The straight sinus and deep cerebral veins are intact. Cortical veins are within normal limits. Anatomic variants: None Review of the MIP images confirms the above findings CT Brain Perfusion Findings: ASPECTS: 10/10 CBF (<30%) Volume: 11mL Perfusion (Tmax>6.0s) volume: 29mL Mismatch Volume: 23mL Infarction Location:n/a IMPRESSION: 1. CT perfusion demonstrates no acute infarct or significant ischemia. 2. No emergent large vessel occlusion. 3. Absence of the right A1 segment is likely due to a hypoplastic or chronically occluded vessel. Anterior communicating artery and both A2 segments are normal. 4. Atherosclerotic changes are noted at the left carotid bifurcation and cavernous internal carotid arteries bilaterally without significant stenosis. 5. Hypoplastic left vertebral artery terminates at the PICA. 6. Mild narrowing of the proximal left PCA. 7. Mild diffuse distal small vessel disease without significant proximal stenosis or occlusion elsewhere within the circle[place]. These results were called by telephone at the time of  interpretation on 12/16/2019 at 4:45 pm to provider [facility] , who verbally acknowledged these results. Electronically Signed   By: San Morelle M.D.   On: 12/16/2019 16:46   DG Chest 2 View  Result Date: 12/16/2019 CLINICAL DATA:  Altered mental status. EXAM: CHEST - 2 VIEW COMPARISON:  PA and lateral chest 06/26/2016. FINDINGS: Heart size is upper normal with pulmonary vascular congestion. No consolidative process, pneumothorax or effusion. No acute or focal bony abnormality. IMPRESSION: Pulmonary vascular congestion.  No acute disease. Electronically Signed   By: Inge Rise M.D.  On: 12/16/2019 15:58   CT Angio Neck W and/or [place] Contrast  Result Date: 12/16/2019 CLINICAL DATA:  Stroke. Progressive acute mental status changes during dialysis. EXAM: CT ANGIOGRAPHY HEAD AND NECK CT PERFUSION BRAIN TECHNIQUE: Multidetector CT imaging of the head and neck was performed using the standard protocol during bolus administration of intravenous contrast. Multiplanar CT image reconstructions and MIPs were obtained to evaluate the vascular anatomy. Carotid stenosis measurements (when applicable) are obtained utilizing NASCET criteria, using the distal internal carotid diameter as the denominator. Multiphase CT imaging of the brain was performed following IV bolus contrast injection. Subsequent parametric perfusion maps were calculated using RAPID software. CONTRAST:  13mL OMNIPAQUE IOHEXOL 350 MG/ML SOLN COMPARISON:  MR head without contrast 06/26/2016 FINDINGS: CT HEAD FINDINGS Brain: No acute infarct, hemorrhage, or mass lesion is present. The ventricles are of normal size. No significant white matter lesions are present. The brainstem and cerebellum are within normal limits. Vascular: Atherosclerotic calcifications are present within the cavernous internal carotid arteries bilaterally. There is no hyperdense vessel. Skull: Calvarium is intact. No focal lytic or blastic lesions are  present. No significant extracranial soft tissue lesion is present. Sinuses/Orbits: The paranasal sinuses and mastoid air cells are clear. The globes and orbits are within normal limits. ASPECTS ([place] Stroke Program Early CT Score) - Ganglionic level infarction (caudate, lentiform nuclei, internal capsule, insula, M1-M3 cortex): 7/7 - Supraganglionic infarction (M4-M6 cortex): 3/3 Total score (0-10 with 10 being normal): 10/10 Review of the MIP images confirms the above findings CTA NECK FINDINGS Aortic arch: A 3 vessel arch configuration is present. There is no significant atherosclerotic disease or stenosis. There is no aneurysm. Right carotid system: The proximal right common carotid artery is obscured by motion artifact. There is no significant stenosis in the right common carotid artery. Right MCA bifurcation is within normal limits. Cervical right ICA is normal. Left carotid system: The left common carotid artery is somewhat distorted by motion. No focal stenosis is present. Atherosclerotic calcifications are present at the bifurcation without significant stenosis. Cervical left ICA is otherwise normal. Vertebral arteries: The right vertebral artery is the dominant vessel. There is some distortion at the C4-5 level. No other significant stenosis is present. Vertebral artery origins are within normal limits bilaterally. Skeleton: Is straightening and reversal of some the normal cervical lordosis. 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 Grade 1 retrolisthesis is likely exaggerated by artifact at C4-5. Focal lytic or blastic lesions are present. Other neck: The soft tissues the neck are otherwise unremarkable. Upper chest: Posterior airspace opacities are present on the right. The left lung is clear. Thoracic inlet is normal. Review of the MIP images confirms the above findings CTA HEAD FINDINGS Anterior circulation: Atherosclerotic calcifications are present within the  cavernous internal carotid arteries bilaterally without a significant stenosis relative to the more distal vessels. ICA termini are normal bilaterally. The left A1 segment is dominant. The right A1 segment is hypoplastic or occluded. The anterior communicating artery is patent and both A2 segments fill. M1 segments are normal bilaterally. The MCA bifurcations are intact. There is some irregularity of distal ACA and MCA branch vessels without a significant proximal stenosis or occlusion. No aneurysm is present. Posterior circulation: The right vertebral artery is the dominant vessel. The left vertebral artery terminates at the PICA. The right AICA vessel is dominant. The basilar artery is normal. Both posterior cerebral arteries originate from the basilar tip. There is mild irregularity of the left P2 segment  with a 50% stenosis. Mild irregularity is present in the distal PCA branch vessels without other significant proximal stenosis or occlusion. Venous sinuses: The dural sinuses are patent. The straight sinus and deep cerebral veins are intact. Cortical veins are within normal limits. Anatomic variants: None Review of the MIP images confirms the above findings CT Brain Perfusion Findings: ASPECTS: 10/10 CBF (<30%) Volume: 65mL Perfusion (Tmax>6.0s) volume: 45mL Mismatch Volume: 36mL Infarction Location:n/a IMPRESSION: 1. CT perfusion demonstrates no acute infarct or significant ischemia. 2. No emergent large vessel occlusion. 3. Absence of the right A1 segment is likely due to a hypoplastic or chronically occluded vessel. Anterior communicating artery and both A2 segments are normal. 4. Atherosclerotic changes are noted at the left carotid bifurcation and cavernous internal carotid arteries bilaterally without significant stenosis. 5. Hypoplastic left vertebral artery terminates at the PICA. 6. Mild narrowing of the proximal left PCA. 7. Mild diffuse distal small vessel disease without significant proximal stenosis or  occlusion elsewhere within the circle[place]of[place]. These results were called by telephone at the time of interpretation on 12/16/2019 at 4:45 pm to provider [facility] , who verbally acknowledged these results. Electronically Signed   By: San Morelle M.D.   On: 12/16/2019 16:46   CT CEREBRAL PERFUSION W CONTRAST  Result Date: 12/16/2019 CLINICAL DATA:  Stroke. Progressive acute mental status changes during dialysis. EXAM: CT ANGIOGRAPHY HEAD AND NECK CT PERFUSION BRAIN TECHNIQUE: Multidetector CT imaging of the head and neck was performed using the standard protocol during bolus administration of intravenous contrast. Multiplanar CT image reconstructions and MIPs were obtained to evaluate the vascular anatomy. Carotid stenosis measurements (when applicable) are obtained utilizing NASCET criteria, using the distal internal carotid diameter as the denominator. Multiphase CT imaging of the brain was performed following IV bolus contrast injection. Subsequent parametric perfusion maps were calculated using RAPID software. CONTRAST:  183mL OMNIPAQUE IOHEXOL 350 MG/ML SOLN COMPARISON:  MR head without contrast 06/26/2016 FINDINGS: CT HEAD FINDINGS Brain: No acute infarct, hemorrhage, or mass lesion is present. The ventricles are of normal size. No significant white matter lesions are present. The brainstem and cerebellum are within normal limits. Vascular: Atherosclerotic calcifications are present within the cavernous internal carotid arteries bilaterally. There is no hyperdense vessel. Skull: Calvarium is intact. No focal lytic or blastic lesions are present. No significant extracranial soft tissue lesion is present. Sinuses/Orbits: The paranasal sinuses and mastoid air cells are clear. The globes and orbits are within normal limits. ASPECTS ([place] Stroke Program Early CT Score) - Ganglionic level infarction (caudate, lentiform nuclei, internal capsule, insula, M1-M3 cortex): 7/7 - Supraganglionic  infarction (M4-M6 cortex): 3/3 Total score (0-10 with 10 being normal): 10/10 Review of the MIP images confirms the above findings CTA NECK FINDINGS Aortic arch: A 3 vessel arch configuration is present. There is no significant atherosclerotic disease or stenosis. There is no aneurysm. Right carotid system: The proximal right common carotid artery is obscured by motion artifact. There is no significant stenosis in the right common carotid artery. Right MCA bifurcation is within normal limits. Cervical right ICA is normal. Left carotid system: The left common carotid artery is somewhat distorted by motion. No focal stenosis is present. Atherosclerotic calcifications are present at the bifurcation without significant stenosis. Cervical left ICA is otherwise normal. Vertebral arteries: The right vertebral artery is the dominant vessel. There is some distortion at the C4-5 level. No other significant stenosis is present. Vertebral artery origins are within normal limits bilaterally. Skeleton: Is straightening and reversal of some  the normal cervical lordosis. 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 Grade 1 retrolisthesis is likely exaggerated by artifact at C4-5. Focal lytic or blastic lesions are present. Other neck: The soft tissues the neck are otherwise unremarkable. Upper chest: Posterior airspace opacities are present on the right. The left lung is clear. Thoracic inlet is normal. Review of the MIP images confirms the above findings CTA HEAD FINDINGS Anterior circulation: Atherosclerotic calcifications are present within the cavernous internal carotid arteries bilaterally without a significant stenosis relative to the more distal vessels. ICA termini are normal bilaterally. The left A1 segment is dominant. The right A1 segment is hypoplastic or occluded. The anterior communicating artery is patent and both A2 segments fill. M1 segments are normal bilaterally. The MCA bifurcations  are intact. There is some irregularity of distal ACA and MCA branch vessels without a significant proximal stenosis or occlusion. No aneurysm is present. Posterior circulation: The right vertebral artery is the dominant vessel. The left vertebral artery terminates at the PICA. The right AICA vessel is dominant. The basilar artery is normal. Both posterior cerebral arteries originate from the basilar tip. There is mild irregularity of the left P2 segment with a 50% stenosis. Mild irregularity is present in the distal PCA branch vessels without other significant proximal stenosis or occlusion. Venous sinuses: The dural sinuses are patent. The straight sinus and deep cerebral veins are intact. Cortical veins are within normal limits. Anatomic variants: None Review of the MIP images confirms the above findings CT Brain Perfusion Findings: ASPECTS: 10/10 CBF (<30%) Volume: 19mL Perfusion (Tmax>6.0s) volume: 60mL Mismatch Volume: 33mL Infarction Location:n/a IMPRESSION: 1. CT perfusion demonstrates no acute infarct or significant ischemia. 2. No emergent large vessel occlusion. 3. Absence of the right A1 segment is likely due to a hypoplastic or chronically occluded vessel. Anterior communicating artery and both A2 segments are normal. 4. Atherosclerotic changes are noted at the left carotid bifurcation and cavernous internal carotid arteries bilaterally without significant stenosis. 5. Hypoplastic left vertebral artery terminates at the PICA. 6. Mild narrowing of the proximal left PCA. 7. Mild diffuse distal small vessel disease without significant proximal stenosis or occlusion elsewhere within the circle[place]of-[place]. These results were called by telephone at the time of interpretation on 12/16/2019 at 4:45 pm to provider [facility] , who verbally acknowledged these results. Electronically Signed   By: San Morelle M.D.   On: 12/16/2019 16:46    Pending Labs Unresulted Labs (From admission, onward)     Start     Ordered   12/16/19 1405  SARS CORONAVIRUS 2 (TAT 6-24 HRS) Nasopharyngeal Nasopharyngeal Swab  (Tier 3 (TAT 6-24 hrs))  Once,   STAT    Question Answer Comment  Is this test for diagnosis or screening Screening   Symptomatic for COVID-19 as defined by CDC No   Hospitalized for COVID-19 No   Admitted to ICU for COVID-19 No   Previously tested for COVID-19 No   Resident in a congregate (group) care setting No   Employed in healthcare setting No   Pregnant No      12/16/19 1404   12/16/19 1151  Lactic acid, plasma  Now then every 2 hours,   STAT     12/16/19 1150          Vitals/Pain Today's Vitals   12/16/19 1530 12/16/19 1731 12/16/19 1816 12/16/19 1900  BP: (!) 148/105  (!) 161/82 (!) 201/94  Pulse: (!) 110  (!) 108 (!) 106  Resp:  17 17  Temp:  (!) 102.7 F (39.3 C)    TempSrc:  Oral    SpO2: 98%  99% 94%  PainSc:   0-No pain     Isolation Precautions No active isolations  Medications Medications  iohexol (OMNIPAQUE) 350 MG/ML injection 100 mL (100 mLs Intravenous Contrast Given 12/16/19 1623)  acetaminophen (TYLENOL) tablet 1,000 mg (1,000 mg Oral Given 12/16/19 1817)    Mobility walks with person assist High fall risk   Focused Assessments Neuro Assessment Handoff:  Swallow screen pass? No    NIH Stroke Scale ( + Modified Stroke Scale Criteria)  Interval: Initial Level of Consciousness (1a.)   : Alert, keenly responsive LOC Questions (1b. )   +: Answers one question correctly LOC Commands (1c. )   + : Performs both tasks correctly Best Gaze (2. )  +: Normal Visual (3. )  +: No visual loss Facial Palsy (4. )    : Normal symmetrical movements Motor Arm, Left (5a. )   +: No drift Motor Arm, Right (5b. )   +: No drift Motor Leg, Left (6a. )   +: No drift Motor Leg, Right (6b. )   +: No drift Limb Ataxia (7. ): Absent Sensory (8. )   +: Normal, no sensory loss Best Language (9. )   +: No aphasia Dysarthria (10. ):  Normal Extinction/Inattention (11.)   +: No Abnormality Modified SS Total  +: 1 Complete NIHSS TOTAL: 1 Last date known well: 12/16/19   Neuro Assessment: Exceptions to WDL Neuro Checks:   Initial (12/16/19 1030)  Last Documented NIHSS Modified Score: 1 (12/16/19 1434) Has TPA been given? No If patient is a Neuro Trauma and patient is going to OR before floor call report to [place] nurse: [phone] or [phone]     R Recommendations: See Admitting Provider Note  Report given to:   Additional Notes:

## 2019-12-16 NOTE — ED Triage Notes (Signed)
Pt arrives via ptar from dialysis for AMS, pt is normally a/ox4 at baseline. Upon starting dialysis they reported that patient was at baseline but throughout her session she became disoriented. Dialysis reported that patient finished her session today and that patient's daughter also recently tested positive for covid and is in the hospital. Pt alert and oriented to person and place only. resp e/u, nad. Speech clear, face symmetrical, moves all limbs equally.

## 2019-12-16 NOTE — ED Notes (Signed)
Marrianne Mood Shappell [phone] would like updates ( daughter)

## 2019-12-17 DIAGNOSIS — U071 COVID-19: Principal | ICD-10-CM

## 2019-12-17 DIAGNOSIS — G9341 Metabolic encephalopathy: Secondary | ICD-10-CM

## 2019-12-17 DIAGNOSIS — N186 End stage renal disease: Secondary | ICD-10-CM

## 2019-12-17 DIAGNOSIS — E1365 Other specified diabetes mellitus with hyperglycemia: Secondary | ICD-10-CM

## 2019-12-17 DIAGNOSIS — R410 Disorientation, unspecified: Secondary | ICD-10-CM

## 2019-12-17 LAB — CBC WITH DIFFERENTIAL/PLATELET
Abs Immature Granulocytes: 0.01 10*3/uL (ref 0.00–0.07)
Basophils Absolute: 0 10*3/uL (ref 0.0–0.1)
Basophils Relative: 0 %
Eosinophils Absolute: 0 10*3/uL (ref 0.0–0.5)
Eosinophils Relative: 0 %
HCT: 31.2 % — ABNORMAL LOW (ref 36.0–46.0)
Hemoglobin: 10 g/dL — ABNORMAL LOW (ref 12.0–15.0)
Immature Granulocytes: 0 %
Lymphocytes Relative: 17 %
Lymphs Abs: 0.5 10*3/uL — ABNORMAL LOW (ref 0.7–4.0)
MCH: 29.3 pg (ref 26.0–34.0)
MCHC: 32.1 g/dL (ref 30.0–36.0)
MCV: 91.5 fL (ref 80.0–100.0)
Monocytes Absolute: 0.3 10*3/uL (ref 0.1–1.0)
Monocytes Relative: 12 %
Neutro Abs: 2 10*3/uL (ref 1.7–7.7)
Neutrophils Relative %: 71 %
Platelets: 144 10*3/uL — ABNORMAL LOW (ref 150–400)
RBC: 3.41 MIL/uL — ABNORMAL LOW (ref 3.87–5.11)
RDW: 14.6 % (ref 11.5–15.5)
WBC: 2.8 10*3/uL — ABNORMAL LOW (ref 4.0–10.5)
nRBC: 0 % (ref 0.0–0.2)

## 2019-12-17 LAB — CBG MONITORING, ED
Glucose-Capillary: 123 mg/dL — ABNORMAL HIGH (ref 70–99)
Glucose-Capillary: 158 mg/dL — ABNORMAL HIGH (ref 70–99)
Glucose-Capillary: 227 mg/dL — ABNORMAL HIGH (ref 70–99)
Glucose-Capillary: 301 mg/dL — ABNORMAL HIGH (ref 70–99)

## 2019-12-17 LAB — COMPREHENSIVE METABOLIC PANEL
ALT: 21 U/L (ref 0–44)
AST: 38 U/L (ref 15–41)
Albumin: 2.9 g/dL — ABNORMAL LOW (ref 3.5–5.0)
Alkaline Phosphatase: 47 U/L (ref 38–126)
Anion gap: 15 (ref 5–15)
BUN: 31 mg/dL — ABNORMAL HIGH (ref 8–23)
CO2: 26 mmol/L (ref 22–32)
Calcium: 7.8 mg/dL — ABNORMAL LOW (ref 8.9–10.3)
Chloride: 95 mmol/L — ABNORMAL LOW (ref 98–111)
Creatinine, Ser: 7.47 mg/dL — ABNORMAL HIGH (ref 0.44–1.00)
GFR calc Af Amer: 6 mL/min — ABNORMAL LOW (ref >60)
GFR calc non Af Amer: 5 mL/min — ABNORMAL LOW (ref >60)
Glucose, Bld: 118 mg/dL — ABNORMAL HIGH (ref 70–99)
Potassium: 3.7 mmol/L (ref 3.5–5.1)
Sodium: 136 mmol/L (ref 135–145)
Total Bilirubin: 0.6 mg/dL (ref 0.3–1.2)
Total Protein: 6.1 g/dL — ABNORMAL LOW (ref 6.5–8.1)

## 2019-12-17 LAB — ABO/RH: ABO/RH(D): O POS

## 2019-12-17 LAB — C-REACTIVE PROTEIN
CRP: 5.2 mg/dL — ABNORMAL HIGH (ref <1.0)
CRP: 5.6 mg/dL — ABNORMAL HIGH (ref <1.0)

## 2019-12-17 LAB — PROCALCITONIN: Procalcitonin: 0.21 ng/mL

## 2019-12-17 LAB — D-DIMER, QUANTITATIVE
D-Dimer, Quant: 1.55 ug/mL-FEU — ABNORMAL HIGH (ref 0.00–0.50)
D-Dimer, Quant: 1.6 ug/mL-FEU — ABNORMAL HIGH (ref 0.00–0.50)

## 2019-12-17 LAB — LACTIC ACID, PLASMA: Lactic Acid, Venous: 1.2 mmol/L (ref 0.5–1.9)

## 2019-12-17 LAB — HEMOGLOBIN A1C
Hgb A1c MFr Bld: 8.6 % — ABNORMAL HIGH (ref 4.8–5.6)
Mean Plasma Glucose: 200.12 mg/dL

## 2019-12-17 MED ORDER — DEXAMETHASONE SODIUM PHOSPHATE 10 MG/ML IJ SOLN
6.0000 mg | Freq: Every day | INTRAMUSCULAR | Status: DC
  Administered 2019-12-17 – 2019-12-20 (×4): 6 mg via INTRAVENOUS
  Filled 2019-12-17 (×4): qty 1

## 2019-12-17 MED ORDER — ASPIRIN EC 81 MG PO TBEC
81.0000 mg | DELAYED_RELEASE_TABLET | Freq: Every day | ORAL | Status: DC
  Administered 2019-12-17 – 2019-12-20 (×4): 81 mg via ORAL
  Filled 2019-12-17 (×4): qty 1

## 2019-12-17 MED ORDER — FAMOTIDINE 20 MG PO TABS
20.0000 mg | ORAL_TABLET | Freq: Every day | ORAL | Status: DC
  Administered 2019-12-17 – 2019-12-20 (×4): 20 mg via ORAL
  Filled 2019-12-17 (×4): qty 1

## 2019-12-17 MED ORDER — CARVEDILOL 25 MG PO TABS
25.0000 mg | ORAL_TABLET | Freq: Two times a day (BID) | ORAL | Status: DC
  Administered 2019-12-17 – 2019-12-20 (×6): 25 mg via ORAL
  Filled 2019-12-17: qty 2
  Filled 2019-12-17 (×2): qty 1
  Filled 2019-12-17: qty 2
  Filled 2019-12-17 (×2): qty 1

## 2019-12-17 MED ORDER — RENA-VITE PO TABS
1.0000 | ORAL_TABLET | Freq: Every evening | ORAL | Status: DC
  Administered 2019-12-17 – 2019-12-19 (×3): 1 via ORAL
  Filled 2019-12-17 (×3): qty 1

## 2019-12-17 MED ORDER — ESCITALOPRAM OXALATE 10 MG PO TABS
5.0000 mg | ORAL_TABLET | Freq: Every day | ORAL | Status: DC
  Administered 2019-12-17 – 2019-12-20 (×4): 5 mg via ORAL
  Filled 2019-12-17 (×4): qty 1

## 2019-12-17 MED ORDER — LOSARTAN POTASSIUM 50 MG PO TABS
25.0000 mg | ORAL_TABLET | Freq: Every day | ORAL | Status: DC
  Administered 2019-12-17 – 2019-12-20 (×4): 25 mg via ORAL
  Filled 2019-12-17 (×4): qty 1

## 2019-12-17 MED ORDER — INSULIN ASPART 100 UNIT/ML ~~LOC~~ SOLN
0.0000 [IU] | Freq: Three times a day (TID) | SUBCUTANEOUS | Status: DC
  Administered 2019-12-17: 1 [IU] via SUBCUTANEOUS
  Administered 2019-12-17: 3 [IU] via SUBCUTANEOUS
  Administered 2019-12-17: 2 [IU] via SUBCUTANEOUS
  Administered 2019-12-18 (×2): 3 [IU] via SUBCUTANEOUS
  Administered 2019-12-19: 9 [IU] via SUBCUTANEOUS
  Administered 2019-12-19: 7 [IU] via SUBCUTANEOUS
  Administered 2019-12-19: 3 [IU] via SUBCUTANEOUS
  Administered 2019-12-20: 09:00:00 2 [IU] via SUBCUTANEOUS
  Administered 2019-12-20: 3 [IU] via SUBCUTANEOUS

## 2019-12-17 MED ORDER — CALCIUM ACETATE (PHOS BINDER) 667 MG PO CAPS
667.0000 mg | ORAL_CAPSULE | Freq: Three times a day (TID) | ORAL | Status: DC
  Administered 2019-12-17 – 2019-12-20 (×10): 667 mg via ORAL
  Filled 2019-12-17 (×12): qty 1

## 2019-12-17 MED ORDER — INSULIN GLARGINE 100 UNIT/ML ~~LOC~~ SOLN
10.0000 [IU] | Freq: Every day | SUBCUTANEOUS | Status: DC
  Administered 2019-12-17 – 2019-12-18 (×2): 10 [IU] via SUBCUTANEOUS
  Filled 2019-12-17 (×2): qty 0.1

## 2019-12-17 MED ORDER — ALLOPURINOL 100 MG PO TABS
200.0000 mg | ORAL_TABLET | Freq: Every day | ORAL | Status: DC
  Administered 2019-12-17 – 2019-12-20 (×4): 200 mg via ORAL
  Filled 2019-12-17 (×4): qty 2

## 2019-12-17 MED ORDER — LORATADINE 10 MG PO TABS
10.0000 mg | ORAL_TABLET | Freq: Every day | ORAL | Status: DC
  Administered 2019-12-17 – 2019-12-20 (×4): 10 mg via ORAL
  Filled 2019-12-17 (×4): qty 1

## 2019-12-17 MED ORDER — LISINOPRIL 10 MG PO TABS
10.0000 mg | ORAL_TABLET | Freq: Every day | ORAL | Status: DC
  Administered 2019-12-17: 10 mg via ORAL
  Filled 2019-12-17: qty 1

## 2019-12-17 MED ORDER — PANTOPRAZOLE SODIUM 40 MG PO TBEC
40.0000 mg | DELAYED_RELEASE_TABLET | Freq: Every day | ORAL | Status: DC
  Administered 2019-12-17 – 2019-12-20 (×4): 40 mg via ORAL
  Filled 2019-12-17 (×4): qty 1

## 2019-12-17 MED ORDER — ROSUVASTATIN CALCIUM 5 MG PO TABS
10.0000 mg | ORAL_TABLET | ORAL | Status: DC
  Administered 2019-12-17 – 2019-12-19 (×3): 10 mg via ORAL
  Filled 2019-12-17 (×5): qty 2

## 2019-12-17 MED ORDER — CALCITRIOL 0.25 MCG PO CAPS
0.2500 ug | ORAL_CAPSULE | ORAL | Status: DC
  Administered 2019-12-18 – 2019-12-20 (×2): 0.25 ug via ORAL
  Filled 2019-12-17 (×2): qty 1

## 2019-12-17 MED ORDER — DARBEPOETIN ALFA 40 MCG/0.4ML IJ SOSY: 40.0000 ug | PREFILLED_SYRINGE | INTRAMUSCULAR | Status: DC

## 2019-12-17 MED ORDER — INSULIN ASPART 100 UNIT/ML ~~LOC~~ SOLN
5.0000 [IU] | Freq: Once | SUBCUTANEOUS | Status: AC
  Administered 2019-12-17: 5 [IU] via SUBCUTANEOUS

## 2019-12-17 MED ORDER — NEPRO/CARBSTEADY PO LIQD
237.0000 mL | Freq: Three times a day (TID) | ORAL | Status: DC
  Administered 2019-12-17 – 2019-12-20 (×6): 237 mL via ORAL
  Filled 2019-12-17 (×4): qty 237

## 2019-12-17 MED ORDER — CHLORHEXIDINE GLUCONATE CLOTH 2 % EX PADS
6.0000 | MEDICATED_PAD | Freq: Every day | CUTANEOUS | Status: DC
  Administered 2019-12-18 – 2019-12-20 (×3): 6 via TOPICAL

## 2019-12-17 MED ORDER — CALCITRIOL 0.25 MCG PO CAPS
0.2500 ug | ORAL_CAPSULE | Freq: Every day | ORAL | Status: DC
  Administered 2019-12-17: 0.25 ug via ORAL
  Filled 2019-12-17: qty 1

## 2019-12-17 MED ORDER — FUROSEMIDE 20 MG PO TABS
80.0000 mg | ORAL_TABLET | Freq: Every day | ORAL | Status: DC
  Administered 2019-12-17: 80 mg via ORAL
  Filled 2019-12-17: qty 4

## 2019-12-17 NOTE — ED Notes (Signed)
Pt has breakfast tray at bedside.  

## 2019-12-17 NOTE — ED Notes (Signed)
Pt kept inquiring about wanting a blanket. This RN took pt's temperature, pt is febrile at 102.6F. This RN will administer PRN tylenol per order

## 2019-12-17 NOTE — ED Notes (Signed)
Pt lunch tray delivered.

## 2019-12-17 NOTE — ED Notes (Signed)
Breakfast ordered 

## 2019-12-17 NOTE — ED Notes (Addendum)
This RN attempted to obtain bloodwork, was unable to do so. Admitting MD at bedside, this RN informed MD of inability to obtain bloodwork. MD told this RN to document inability and have phlebotomy try. Will continue to attempt as able.

## 2019-12-17 NOTE — ED Notes (Signed)
Pt's daughter updated on status and plan of care (with pt's permission)

## 2019-12-17 NOTE — H&P (Addendum)
History and Physical    Katie [name] DOB: 1941-02-13 DOA: 12/16/2019  PCP: [name], MD  Patient coming from: Home  I have personally briefly reviewed patient's old medical records in [place]  Chief Complaint: AMS  HPI: Katie Garcia is a 78 y.o. female with medical history significant of ESRD, HTN, DM2.  Patient had progressively worsening AMS during dialysis today.  Finished dialysis then sent to ED for evaluation.   ED Course: Fever 102.x on arrival.  COVID+ in ED, has family member who is also COVID+.  WBC nl, CXR shows pulmonary vascular congestion.  Apparently initially very encephalopathic in ED.  CTA head and neck neg for acute stroke findings.  By the time I am seeing patient for admission, she thankfully has defervesced and is now awake, AAOx4, and mentating well.    Review of Systems: As per HPI, otherwise all review of systems negative.  Past Medical History:  Diagnosis Date  . Anemia   . Arthritis   . Chronic kidney disease   . Diabetes mellitus without complication ([place])   . GERD (gastroesophageal reflux disease)   . Headache   . Hypertension     Past Surgical History:  Procedure Laterality Date  . ABDOMINAL HYSTERECTOMY    . AV FISTULA PLACEMENT Left 10/02/2016   Procedure: ARTERIOVENOUS (AV) FISTULA CREATION LEFT UPPER ARM;  Surgeon: Waynetta Sandy, MD;  Location: [facility];  Service: Vascular;  Laterality: Left;  . CESAREAN SECTION       reports that she has never smoked. She has never used smokeless tobacco. She reports that she does not drink alcohol or use drugs.  Allergies  Allergen Reactions  . Penicillins Rash    Did it involve swelling of the face/tongue/throat, SOB, or low BP? No Did it involve sudden or severe rash/hives, skin peeling, or any reaction on the inside of your mouth or nose? Yes Did you need to seek medical attention at a hospital or doctor's office? N/A When did it last  happen?N/A If all above answers are "NO", may proceed with cephalosporin use.    Family History  Problem Relation Age of Onset  . Diabetes Mother      Prior to Admission medications   Medication Sig Start Date End Date Taking? Authorizing Provider  ACCU-CHEK GUIDE test strip USE TO CHECK BG UP TO 4 TIMES A DAY (DX: E11.22 - DIABETES WITH CKD AND INSULIN THERAPY) IN VITRO 10/06/18  Yes [provider]  allopurinol (ZYLOPRIM) 100 MG tablet Take 200 mg by mouth daily.    Yes [provider]  aspirin 81 MG tablet Take 81 mg by mouth daily.   Yes [provider]  B Complex-C-Zn-Folic Acid (DIALYVITE/ZINC) TABS Take 1 tablet by mouth every evening.  10/25/18  Yes [provider]  calcitRIOL (ROCALTROL) 0.25 MCG capsule Take 0.25 mcg by mouth daily.   Yes [provider]  calcium acetate (PHOSLO) 667 MG capsule Take 667 mg by mouth 3 (three) times daily with meals.  10/24/18  Yes [provider]  Calcium Carbonate Antacid (TUMS E-X PO) Take 1 tablet by mouth daily.    Yes [provider]  carvedilol (COREG) 25 MG tablet Take 25 mg by mouth 2 (two) times daily with a meal.   Yes [provider]  escitalopram (LEXAPRO) 5 MG tablet Take 5 mg by mouth daily.  04/27/19  Yes [provider]  famotidine (PEPCID) 20 MG tablet Take 20 mg by  mouth daily. 10/21/19  Yes [provider]  furosemide (LASIX) 40 MG tablet Take 80 mg by mouth daily.    Yes [provider]  insulin NPH-regular Human (HUMULIN 70/30) (70-30) 100 UNIT/ML injection Inject 26 Units into the skin daily with breakfast. Inject 26 Units into the skin daily with breakfast. Patient taking differently: Inject 28 Units into the skin daily with breakfast. Inject 28 Units into the skin daily with breakfast. 08/26/19  Yes Renato Shin, MD  Iron-FA-B Cmp-C-Biot-Probiotic (FUSION PLUS PO) Take 1 tablet by mouth daily.    Yes [provider]    lisinopril (ZESTRIL) 10 MG tablet Take 10 mg by mouth daily.  02/27/19  Yes [provider]  loratadine (CLARITIN) 10 MG tablet Take 10 mg by mouth daily. 04/01/19  Yes [provider]  losartan (COZAAR) 25 MG tablet Take 25 mg by mouth daily. 11/03/19  Yes [provider]  omeprazole (PRILOSEC) 20 MG capsule Take 1 capsule (20 mg total) by mouth daily. 06/26/16  Yes Kirichenko, Tatyana, PA-C  RELION INSULIN SYR 0.5ML/31G 31G X 5/16" 0.5 ML MISC USE 1 ONCE DAILY 03/05/19  Yes [provider]  rosuvastatin (CRESTOR) 10 MG tablet Take 10 mg by mouth every Monday, Wednesday, and Friday.   Yes [provider]  triamcinolone cream (KENALOG) 0.1 % Apply 1 application topically daily as needed (apply to face and Groin area).  05/28/19  Yes [provider]    Physical Exam: Vitals:   12/16/19 2300 12/16/19 2315 12/16/19 2330 12/16/19 2345  BP: (!) 144/82 (!) 155/59 (!) 165/59 (!) 163/69  Pulse: 83 84 82 81  Resp:      Temp:      TempSrc:      SpO2: 95% 100% 98% 96%    Constitutional: NAD, calm, comfortable Eyes: PERRL, lids and conjunctivae normal ENMT: Mucous membranes are moist. Posterior pharynx clear of any exudate or lesions.Normal dentition.  Neck: normal, supple, no masses, no thyromegaly Respiratory: clear to auscultation bilaterally, no wheezing, no crackles. Normal respiratory effort. No accessory muscle use.  Cardiovascular: Regular rate and rhythm, no murmurs / rubs / gallops. No extremity edema. 2+ pedal pulses. No carotid bruits.  Abdomen: no tenderness, no masses palpated. No hepatosplenomegaly. Bowel sounds positive.  Musculoskeletal: no clubbing / cyanosis. No joint deformity upper and lower extremities. Good ROM, no contractures. Normal muscle tone.  Skin: no rashes, lesions, ulcers. No induration Neurologic: CN 2-12 grossly intact. Sensation intact, DTR normal. Strength 5/5 in all 4.  Psychiatric: Normal judgment and insight.  Alert and oriented x 3. Normal mood.    Labs on Admission: I have personally reviewed following labs and imaging studies  CBC: Recent Labs  Lab 12/16/19 1125  WBC 3.9*  NEUTROABS 2.7  HGB 11.1*  HCT 34.6*  MCV 91.5  PLT 123XX123   Basic Metabolic Panel: Recent Labs  Lab 12/16/19 1125  NA 137  K 3.2*  CL 94*  CO2 29  GLUCOSE 132*  BUN 18  CREATININE 4.95*  CALCIUM 8.3*   GFR: CrCl cannot be calculated (Unknown ideal weight.). Liver Function Tests: No results for input(s): AST, ALT, ALKPHOS, BILITOT, PROT, ALBUMIN in the last 168 hours. No results for input(s): LIPASE, AMYLASE in the last 168 hours. No results for input(s): AMMONIA in the last 168 hours. Coagulation Profile: No results for input(s): INR, PROTIME in the last 168 hours. Cardiac Enzymes: No results for input(s): CKTOTAL, CKMB, CKMBINDEX, TROPONINI in the last 168 hours. BNP (last 3  results) No results for input(s): PROBNP in the last 8760 hours. HbA1C: No results for input(s): HGBA1C in the last 72 hours. CBG: Recent Labs  Lab 12/16/19 2206 12/16/19 2358  GLUCAP 149* 145*   Lipid Profile: No results for input(s): CHOL, HDL, LDLCALC, TRIG, CHOLHDL, LDLDIRECT in the last 72 hours. Thyroid Function Tests: No results for input(s): TSH, T4TOTAL, FREET4, T3FREE, THYROIDAB in the last 72 hours. Anemia Panel: No results for input(s): VITAMINB12, FOLATE, FERRITIN, TIBC, IRON, RETICCTPCT in the last 72 hours. Urine analysis:    Component Value Date/Time   COLORURINE YELLOW 06/26/2016 1135   APPEARANCEUR CLOUDY (A) 06/26/2016 1135   LABSPEC 1.018 06/26/2016 1135   PHURINE 5.5 06/26/2016 1135   GLUCOSEU 250 (A) 06/26/2016 1135   HGBUR SMALL (A) 06/26/2016 1135   BILIRUBINUR NEGATIVE 06/26/2016 1135   KETONESUR NEGATIVE 06/26/2016 1135   PROTEINUR >300 (A) 06/26/2016 1135   UROBILINOGEN 0.2 03/05/2014 0013   NITRITE NEGATIVE 06/26/2016 1135   LEUKOCYTESUR SMALL (A) 06/26/2016 1135    Radiological  Exams on Admission: CT Angio Head W or Wo Contrast  Result Date: 12/16/2019 CLINICAL DATA:  Stroke. Progressive acute mental status changes during dialysis. EXAM: CT ANGIOGRAPHY HEAD AND NECK CT PERFUSION BRAIN TECHNIQUE: Multidetector CT imaging of the head and neck was performed using the standard protocol during bolus administration of intravenous contrast. Multiplanar CT image reconstructions and MIPs were obtained to evaluate the vascular anatomy. Carotid stenosis measurements (when applicable) are obtained utilizing NASCET criteria, using the distal internal carotid diameter as the denominator. Multiphase CT imaging of the brain was performed following IV bolus contrast injection. Subsequent parametric perfusion maps were calculated using RAPID software. CONTRAST:  136mL OMNIPAQUE IOHEXOL 350 MG/ML SOLN COMPARISON:  MR head without contrast 06/26/2016 FINDINGS: CT HEAD FINDINGS Brain: No acute infarct, hemorrhage, or mass lesion is present. The ventricles are of normal size. No significant white matter lesions are present. The brainstem and cerebellum are within normal limits. Vascular: Atherosclerotic calcifications are present within the cavernous internal carotid arteries bilaterally. There is no hyperdense vessel. Skull: Calvarium is intact. No focal lytic or blastic lesions are present. No significant extracranial soft tissue lesion is present. Sinuses/Orbits: The paranasal sinuses and mastoid air cells are clear. The globes and orbits are within normal limits. ASPECTS ([place] Stroke Program Early CT Score) - Ganglionic level infarction (caudate, lentiform nuclei, internal capsule, insula, M1-M3 cortex): 7/7 - Supraganglionic infarction (M4-M6 cortex): 3/3 Total score (0-10 with 10 being normal): 10/10 Review of the MIP images confirms the above findings CTA NECK FINDINGS Aortic arch: A 3 vessel arch configuration is present. There is no significant atherosclerotic disease or stenosis. There is no  aneurysm. Right carotid system: The proximal right common carotid artery is obscured by motion artifact. There is no significant stenosis in the right common carotid artery. Right MCA bifurcation is within normal limits. Cervical right ICA is normal. Left carotid system: The left common carotid artery is somewhat distorted by motion. No focal stenosis is present. Atherosclerotic calcifications are present at the bifurcation without significant stenosis. Cervical left ICA is otherwise normal. Vertebral arteries: The right vertebral artery is the dominant vessel. There is some distortion at the C4-5 level. No other significant stenosis is present. Vertebral artery origins are within normal limits bilaterally. Skeleton: Is straightening and reversal of some the normal cervical lordosis. 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 Grade 1 retrolisthesis is likely exaggerated by artifact at C4-5. Focal lytic or blastic lesions  are present. Other neck: The soft tissues the neck are otherwise unremarkable. Upper chest: Posterior airspace opacities are present on the right. The left lung is clear. Thoracic inlet is normal. Review of the MIP images confirms the above findings CTA HEAD FINDINGS Anterior circulation: Atherosclerotic calcifications are present within the cavernous internal carotid arteries bilaterally without a significant stenosis relative to the more distal vessels. ICA termini are normal bilaterally. The left A1 segment is dominant. The right A1 segment is hypoplastic or occluded. The anterior communicating artery is patent and both A2 segments fill. M1 segments are normal bilaterally. The MCA bifurcations are intact. There is some irregularity of distal ACA and MCA branch vessels without a significant proximal stenosis or occlusion. No aneurysm is present. Posterior circulation: The right vertebral artery is the dominant vessel. The left vertebral artery terminates at the PICA.  The right AICA vessel is dominant. The basilar artery is normal. Both posterior cerebral arteries originate from the basilar tip. There is mild irregularity of the left P2 segment with a 50% stenosis. Mild irregularity is present in the distal PCA branch vessels without other significant proximal stenosis or occlusion. Venous sinuses: The dural sinuses are patent. The straight sinus and deep cerebral veins are intact. Cortical veins are within normal limits. Anatomic variants: None Review of the MIP images confirms the above findings CT Brain Perfusion Findings: ASPECTS: 10/10 CBF (<30%) Volume: 35mL Perfusion (Tmax>6.0s) volume: 34mL Mismatch Volume: 15mL Infarction Location:n/a IMPRESSION: 1. CT perfusion demonstrates no acute infarct or significant ischemia. 2. No emergent large vessel occlusion. 3. Absence of the right A1 segment is likely due to a hypoplastic or chronically occluded vessel. Anterior communicating artery and both A2 segments are normal. 4. Atherosclerotic changes are noted at the left carotid bifurcation and cavernous internal carotid arteries bilaterally without significant stenosis. 5. Hypoplastic left vertebral artery terminates at the PICA. 6. Mild narrowing of the proximal left PCA. 7. Mild diffuse distal small vessel disease without significant proximal stenosis or occlusion elsewhere within the circle[place]. These results were called by telephone at the time of interpretation on 12/16/2019 at 4:45 pm to provider [facility] , who verbally acknowledged these results. Electronically Signed   By: San Morelle M.D.   On: 12/16/2019 16:46   DG Chest 2 View  Result Date: 12/16/2019 CLINICAL DATA:  Altered mental status. EXAM: CHEST - 2 VIEW COMPARISON:  PA and lateral chest 06/26/2016. FINDINGS: Heart size is upper normal with pulmonary vascular congestion. No consolidative process, pneumothorax or effusion. No acute or focal bony abnormality. IMPRESSION: Pulmonary vascular  congestion.  No acute disease. Electronically Signed   By: Inge Rise M.D.   On: 12/16/2019 15:58   CT Angio Neck W and/or Wo Contrast  Result Date: 12/16/2019 CLINICAL DATA:  Stroke. Progressive acute mental status changes during dialysis. EXAM: CT ANGIOGRAPHY HEAD AND NECK CT PERFUSION BRAIN TECHNIQUE: Multidetector CT imaging of the head and neck was performed using the standard protocol during bolus administration of intravenous contrast. Multiplanar CT image reconstructions and MIPs were obtained to evaluate the vascular anatomy. Carotid stenosis measurements (when applicable) are obtained utilizing NASCET criteria, using the distal internal carotid diameter as the denominator. Multiphase CT imaging of the brain was performed following IV bolus contrast injection. Subsequent parametric perfusion maps were calculated using RAPID software. CONTRAST:  155mL OMNIPAQUE IOHEXOL 350 MG/ML SOLN COMPARISON:  MR head without contrast 06/26/2016 FINDINGS: CT HEAD FINDINGS Brain: No acute infarct, hemorrhage, or mass lesion is present. The ventricles are of  normal size. No significant white matter lesions are present. The brainstem and cerebellum are within normal limits. Vascular: Atherosclerotic calcifications are present within the cavernous internal carotid arteries bilaterally. There is no hyperdense vessel. Skull: Calvarium is intact. No focal lytic or blastic lesions are present. No significant extracranial soft tissue lesion is present. Sinuses/Orbits: The paranasal sinuses and mastoid air cells are clear. The globes and orbits are within normal limits. ASPECTS ([place] Stroke Program Early CT Score) - Ganglionic level infarction (caudate, lentiform nuclei, internal capsule, insula, M1-M3 cortex): 7/7 - Supraganglionic infarction (M4-M6 cortex): 3/3 Total score (0-10 with 10 being normal): 10/10 Review of the MIP images confirms the above findings CTA NECK FINDINGS Aortic arch: A 3 vessel arch  configuration is present. There is no significant atherosclerotic disease or stenosis. There is no aneurysm. Right carotid system: The proximal right common carotid artery is obscured by motion artifact. There is no significant stenosis in the right common carotid artery. Right MCA bifurcation is within normal limits. Cervical right ICA is normal. Left carotid system: The left common carotid artery is somewhat distorted by motion. No focal stenosis is present. Atherosclerotic calcifications are present at the bifurcation without significant stenosis. Cervical left ICA is otherwise normal. Vertebral arteries: The right vertebral artery is the dominant vessel. There is some distortion at the C4-5 level. No other significant stenosis is present. Vertebral artery origins are within normal limits bilaterally. Skeleton: Is straightening and reversal of some the normal cervical lordosis. 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 Grade 1 retrolisthesis is likely exaggerated by artifact at C4-5. Focal lytic or blastic lesions are present. Other neck: The soft tissues the neck are otherwise unremarkable. Upper chest: Posterior airspace opacities are present on the right. The left lung is clear. Thoracic inlet is normal. Review of the MIP images confirms the above findings CTA HEAD FINDINGS Anterior circulation: Atherosclerotic calcifications are present within the cavernous internal carotid arteries bilaterally without a significant stenosis relative to the more distal vessels. ICA termini are normal bilaterally. The left A1 segment is dominant. The right A1 segment is hypoplastic or occluded. The anterior communicating artery is patent and both A2 segments fill. M1 segments are normal bilaterally. The MCA bifurcations are intact. There is some irregularity of distal ACA and MCA branch vessels without a significant proximal stenosis or occlusion. No aneurysm is present. Posterior circulation:  The right vertebral artery is the dominant vessel. The left vertebral artery terminates at the PICA. The right AICA vessel is dominant. The basilar artery is normal. Both posterior cerebral arteries originate from the basilar tip. There is mild irregularity of the left P2 segment with a 50% stenosis. Mild irregularity is present in the distal PCA branch vessels without other significant proximal stenosis or occlusion. Venous sinuses: The dural sinuses are patent. The straight sinus and deep cerebral veins are intact. Cortical veins are within normal limits. Anatomic variants: None Review of the MIP images confirms the above findings CT Brain Perfusion Findings: ASPECTS: 10/10 CBF (<30%) Volume: 13mL Perfusion (Tmax>6.0s) volume: 54mL Mismatch Volume: 36mL Infarction Location:n/a IMPRESSION: 1. CT perfusion demonstrates no acute infarct or significant ischemia. 2. No emergent large vessel occlusion. 3. Absence of the right A1 segment is likely due to a hypoplastic or chronically occluded vessel. Anterior communicating artery and both A2 segments are normal. 4. Atherosclerotic changes are noted at the left carotid bifurcation and cavernous internal carotid arteries bilaterally without significant stenosis. 5. Hypoplastic left vertebral artery terminates at the  PICA. 6. Mild narrowing of the proximal left PCA. 7. Mild diffuse distal small vessel disease without significant proximal stenosis or occlusion elsewhere within the circle[place]of[place]. These results were called by telephone at the time of interpretation on 12/16/2019 at 4:45 pm to provider [facility] , who verbally acknowledged these results. Electronically Signed   By: San Morelle M.D.   On: 12/16/2019 16:46   CT CEREBRAL PERFUSION W CONTRAST  Result Date: 12/16/2019 CLINICAL DATA:  Stroke. Progressive acute mental status changes during dialysis. EXAM: CT ANGIOGRAPHY HEAD AND NECK CT PERFUSION BRAIN TECHNIQUE: Multidetector CT imaging of the  head and neck was performed using the standard protocol during bolus administration of intravenous contrast. Multiplanar CT image reconstructions and MIPs were obtained to evaluate the vascular anatomy. Carotid stenosis measurements (when applicable) are obtained utilizing NASCET criteria, using the distal internal carotid diameter as the denominator. Multiphase CT imaging of the brain was performed following IV bolus contrast injection. Subsequent parametric perfusion maps were calculated using RAPID software. CONTRAST:  133mL OMNIPAQUE IOHEXOL 350 MG/ML SOLN COMPARISON:  MR head without contrast 06/26/2016 FINDINGS: CT HEAD FINDINGS Brain: No acute infarct, hemorrhage, or mass lesion is present. The ventricles are of normal size. No significant white matter lesions are present. The brainstem and cerebellum are within normal limits. Vascular: Atherosclerotic calcifications are present within the cavernous internal carotid arteries bilaterally. There is no hyperdense vessel. Skull: Calvarium is intact. No focal lytic or blastic lesions are present. No significant extracranial soft tissue lesion is present. Sinuses/Orbits: The paranasal sinuses and mastoid air cells are clear. The globes and orbits are within normal limits. ASPECTS ([place] Stroke Program Early CT Score) - Ganglionic level infarction (caudate, lentiform nuclei, internal capsule, insula, M1-M3 cortex): 7/7 - Supraganglionic infarction (M4-M6 cortex): 3/3 Total score (0-10 with 10 being normal): 10/10 Review of the MIP images confirms the above findings CTA NECK FINDINGS Aortic arch: A 3 vessel arch configuration is present. There is no significant atherosclerotic disease or stenosis. There is no aneurysm. Right carotid system: The proximal right common carotid artery is obscured by motion artifact. There is no significant stenosis in the right common carotid artery. Right MCA bifurcation is within normal limits. Cervical right ICA is normal. Left  carotid system: The left common carotid artery is somewhat distorted by motion. No focal stenosis is present. Atherosclerotic calcifications are present at the bifurcation without significant stenosis. Cervical left ICA is otherwise normal. Vertebral arteries: The right vertebral artery is the dominant vessel. There is some distortion at the C4-5 level. No other significant stenosis is present. Vertebral artery origins are within normal limits bilaterally. Skeleton: Is straightening and reversal of some the normal cervical lordosis. 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 Grade 1 retrolisthesis is likely exaggerated by artifact at C4-5. Focal lytic or blastic lesions are present. Other neck: The soft tissues the neck are otherwise unremarkable. Upper chest: Posterior airspace opacities are present on the right. The left lung is clear. Thoracic inlet is normal. Review of the MIP images confirms the above findings CTA HEAD FINDINGS Anterior circulation: Atherosclerotic calcifications are present within the cavernous internal carotid arteries bilaterally without a significant stenosis relative to the more distal vessels. ICA termini are normal bilaterally. The left A1 segment is dominant. The right A1 segment is hypoplastic or occluded. The anterior communicating artery is patent and both A2 segments fill. M1 segments are normal bilaterally. The MCA bifurcations are intact. There is some irregularity of distal ACA and  MCA branch vessels without a significant proximal stenosis or occlusion. No aneurysm is present. Posterior circulation: The right vertebral artery is the dominant vessel. The left vertebral artery terminates at the PICA. The right AICA vessel is dominant. The basilar artery is normal. Both posterior cerebral arteries originate from the basilar tip. There is mild irregularity of the left P2 segment with a 50% stenosis. Mild irregularity is present in the distal PCA branch  vessels without other significant proximal stenosis or occlusion. Venous sinuses: The dural sinuses are patent. The straight sinus and deep cerebral veins are intact. Cortical veins are within normal limits. Anatomic variants: None Review of the MIP images confirms the above findings CT Brain Perfusion Findings: ASPECTS: 10/10 CBF (<30%) Volume: 72mL Perfusion (Tmax>6.0s) volume: 25mL Mismatch Volume: 90mL Infarction Location:n/a IMPRESSION: 1. CT perfusion demonstrates no acute infarct or significant ischemia. 2. No emergent large vessel occlusion. 3. Absence of the right A1 segment is likely due to a hypoplastic or chronically occluded vessel. Anterior communicating artery and both A2 segments are normal. 4. Atherosclerotic changes are noted at the left carotid bifurcation and cavernous internal carotid arteries bilaterally without significant stenosis. 5. Hypoplastic left vertebral artery terminates at the PICA. 6. Mild narrowing of the proximal left PCA. 7. Mild diffuse distal small vessel disease without significant proximal stenosis or occlusion elsewhere within the [place]. These results were called by telephone at the time of interpretation on 12/16/2019 at 4:45 pm to provider [facility] , who verbally acknowledged these results. Electronically Signed   By: San Morelle M.D.   On: 12/16/2019 16:46    EKG: Independently reviewed.  Assessment/Plan Principal Problem:   COVID-19 virus infection Active Problems:   DM (diabetes mellitus), secondary uncontrolled ([place])   ESRD (end stage renal disease) ([place])   Acute metabolic encephalopathy    1. COVID-19 - 1. COVID pathway 2. Procalcitonin, d.dimer, and CRP pending 3. Daily labs 4. remdesivir 5. No O2 requirement at this time so not starting steroids 2. Delirium secondary to COVID - 1. Waxing and waning mental status with fever, seems pretty clear that this is delirium due to COVID 2. CTA head and neck neg, CT perfusion  neg for acute stroke 3. Mental status back to baseline by time of my evaluation 1. Though id expect it to worsen again with next fever. 3. DM2 - 1. Hold home 70/30 2. Will use Lantus 10u Daily 3. And sensitive SSI AC 4. HTN - 1. Will continue home meds 5. ESRD - 1. Call nephro in AM for routine IP dialysis during stay  DVT prophylaxis: Heparin [place] Code Status: Full Family Communication: No family in room Disposition Plan: Home after admit Consults called: None Admission status: Admit to inpatient  Severity of Illness: The appropriate patient status for this patient is INPATIENT. Inpatient status is judged to be reasonable and necessary in order to provide the required intensity of service to ensure the patient's safety. The patient's presenting symptoms, physical exam findings, and initial radiographic and laboratory data in the context of their chronic comorbidities is felt to place them at high risk for further clinical deterioration. Furthermore, it is not anticipated that the patient will be medically stable for discharge from the hospital within 2 midnights of admission. The following factors support the patient status of inpatient.   IP status due to Acute encephalopathy associated with COVID-19.   * I certify that at the point of admission it is my clinical judgment that the patient will require inpatient hospital  care spanning beyond 2 midnights from the point of admission due to high intensity of service, high risk for further deterioration and high frequency of surveillance required.*    Keldrick Pomplun M. DO [facility]  How to contact the [facility] Attending or Consulting provider [place] or covering provider during after hours [place], for this patient?  1. Check the care team in [facility] and look for a) attending/consulting [facility] provider listed and b) the [facility] team listed 2. Log into [email or web address]  [employer] Physician Scheduling and messaging for groups and whole hospitals  On  call and physician scheduling software for group practices, residents, hospitalists and other medical providers for call, clinic, rotation and shift schedules. On[employer] is a hospital-wide system for scheduling doctors and paging doctors on call. EasyPlot is for scientific plotting and data analysis.  [email or web address]  and use [facility]'s universal password to access. If you do not have the password, please contact the hospital operator.  3. Locate the [facility] provider you are looking for under [facility] and page to a number that you can be directly reached. 4. If you still have difficulty reaching the provider, please page the [facility] (Director on Call) for the Hospitalists listed on amion for assistance.  12/17/2019, 12:48 AM

## 2019-12-17 NOTE — Consult Note (Signed)
[facility]    Indication for Consultation:  Management of ESRD/hemodialysis; anemia, hypertension/volume and secondary hyperparathyroidism  HPI: [facility] Stay is a 78 y.o. female with ESRD secondary to DM/HTN on TTS [facility] at [facility] who was noted to become progressively confused during her HD session yesterday to the point of trying to get out of her chair.  She actually had driven to dialysis earlier in the day.  Her treatment was terminated after 3 hr and 20 min and she was sent via EMS to the hospital where she was found to be COVID +. She also has a family member who is COVID + Work up in the ED showed CTA head negative for CVA, WBC normal with CXR showing vascular congestion. Temp spike to 102.7 upon arrival is now down to 99.9.  She is currently stable in the ED awaiting a COVID unit bed.  Past Medical History:  Diagnosis Date  . Anemia   . Arthritis   . Chronic kidney disease   . Diabetes mellitus without complication ([place])   . GERD (gastroesophageal reflux disease)   . Headache   . Hypertension    Past Surgical History:  Procedure Laterality Date  . ABDOMINAL HYSTERECTOMY    . AV FISTULA PLACEMENT Left 10/02/2016   Procedure: ARTERIOVENOUS (AV) FISTULA CREATION LEFT UPPER ARM;  Surgeon: Katie Sandy, MD;  Location: [facility];  Service: Vascular;  Laterality: Left;  . CESAREAN SECTION     Family History  Problem Relation Age of Onset  . Diabetes Mother    Social History:  reports that she has never smoked. She has never used smokeless tobacco. She reports that she does not drink alcohol or use drugs. Allergies  Allergen Reactions  . Penicillins Rash    Did it involve swelling of the face/tongue/throat, SOB, or low BP? No Did it involve sudden or severe rash/hives, skin peeling, or any reaction on the inside of your mouth or nose? Yes Did you need to seek medical attention at a hospital or doctor's office? N/A When did  it last happen?N/A If all above answers are "NO", may proceed with cephalosporin use.   Prior to Admission medications   Medication Sig Start Date End Date Taking? Authorizing Provider  ACCU-CHEK GUIDE test strip USE TO CHECK BG UP TO 4 TIMES A DAY (DX: E11.22 - DIABETES WITH CKD AND INSULIN THERAPY) IN VITRO 10/06/18  Yes [provider]  allopurinol (ZYLOPRIM) 100 MG tablet Take 200 mg by mouth daily.    Yes [provider]  aspirin 81 MG tablet Take 81 mg by mouth daily.   Yes [provider]  B Complex-C-Zn-Folic Acid (DIALYVITE/ZINC) TABS Take 1 tablet by mouth every evening.  10/25/18  Yes [provider]  calcitRIOL (ROCALTROL) 0.25 MCG capsule Take 0.25 mcg by mouth daily.   Yes [provider]  calcium acetate (PHOSLO) 667 MG capsule Take 667 mg by mouth 3 (three) times daily with meals.  10/24/18  Yes [provider]  Calcium Carbonate Antacid (TUMS E-X PO) Take 1 tablet by mouth daily.    Yes [provider]  carvedilol (COREG) 25 MG tablet Take 25 mg by mouth 2 (two) times daily with a meal.   Yes [provider]  escitalopram (LEXAPRO) 5 MG tablet Take 5 mg by mouth daily.  04/27/19  Yes [provider]  famotidine (PEPCID) 20 MG tablet Take 20 mg by mouth daily. 10/21/19  Yes [provider]  furosemide (LASIX) 40 MG tablet Take 80 mg by mouth daily.    Yes [provider]  insulin NPH-regular Human (HUMULIN 70/30) (70-30) 100 UNIT/ML injection Inject 26 Units into the skin daily with breakfast. Inject 26 Units into the skin daily with breakfast. Patient taking differently: Inject 28 Units into the skin daily with breakfast. Inject 28 Units into the skin daily with breakfast. 08/26/19  Yes Katie Shin, MD  Iron-FA-B Cmp-C-Biot-Probiotic (FUSION PLUS PO) Take 1 tablet by mouth daily.    Yes [provider]  lisinopril (ZESTRIL) 10 MG tablet Take 10 mg by mouth daily.  02/27/19   Yes [provider]  loratadine (CLARITIN) 10 MG tablet Take 10 mg by mouth daily. 04/01/19  Yes [provider]  losartan (COZAAR) 25 MG tablet Take 25 mg by mouth daily. 11/03/19  Yes [provider]  omeprazole (PRILOSEC) 20 MG capsule Take 1 capsule (20 mg total) by mouth daily. 06/26/16  Yes Kirichenko, Tatyana, PA-C  RELION INSULIN SYR 0.5ML/31G 31G X 5/16" 0.5 ML MISC USE 1 ONCE DAILY 03/05/19  Yes [provider]  rosuvastatin (CRESTOR) 10 MG tablet Take 10 mg by mouth every Monday, Wednesday, and Friday.   Yes [provider]  triamcinolone cream (KENALOG) 0.1 % Apply 1 application topically daily as needed (apply to face and Groin area).  05/28/19  Yes [provider]   Current Facility-Administered Medications  Medication Dose Route Frequency Provider Last Rate Last Admin  . acetaminophen (TYLENOL) tablet 650 mg  650 mg Oral Q6H PRN Etta Quill, DO   650 mg at 12/17/19 0240  . allopurinol (ZYLOPRIM) tablet 200 mg  200 mg Oral Daily Jennette Kettle M, DO   200 mg at 12/17/19 1054  . aspirin EC tablet 81 mg  81 mg Oral Daily Etta Quill, DO   81 mg at 12/17/19 1054  . calcitRIOL (ROCALTROL) capsule 0.25 mcg  0.25 mcg Oral Daily Jennette Kettle M, DO   0.25 mcg at 12/17/19 [id number]  . calcium acetate (PHOSLO) capsule 667 mg  667 mg Oral TID WC Etta Quill, DO   667 mg at 12/17/19 1324  . carvedilol (COREG) tablet 25 mg  25 mg Oral BID WC Jennette Kettle M, DO   25 mg at 12/17/19 0751  . chlorpheniramine-HYDROcodone (TUSSIONEX) 10-8 MG/5ML suspension 5 mL  5 mL Oral Q12H PRN Etta Quill, DO      . dexamethasone (DECADRON) injection 6 mg  6 mg Intravenous Daily Arrien, Jimmy Picket, MD   6 mg at 12/17/19 0825  . escitalopram (LEXAPRO) tablet 5 mg  5 mg Oral Daily Jennette Kettle M, DO   5 mg at 12/17/19 1054  . famotidine (PEPCID) tablet 20 mg  20 mg Oral Daily Jennette Kettle M, DO   20 mg at 12/17/19 1056  . furosemide (LASIX)  tablet 80 mg  80 mg Oral Daily Jennette Kettle M, DO   80 mg at 12/17/19 1056  . guaiFENesin-dextromethorphan (ROBITUSSIN DM) 100-10 MG/5ML syrup 10 mL  10 mL Oral Q4H PRN Etta Quill, DO      . heparin injection 5,000 Units  5,000 Units Subcutaneous Q8H Jennette Kettle M, DO   5,000 Units at 12/17/19 1326  . insulin aspart (novoLOG) injection 0-9 Units  0-9 Units Subcutaneous TID WC Etta Quill, DO   2 Units at 12/17/19 1324  . insulin glargine (LANTUS) injection 10 Units  10 Units Subcutaneous Daily Etta Quill,  DO   10 Units at 12/17/19 0912  . lisinopril (ZESTRIL) tablet 10 mg  10 mg Oral Daily Jennette Kettle M, DO   10 mg at 12/17/19 1055  . loratadine (CLARITIN) tablet 10 mg  10 mg Oral Daily Jennette Kettle M, DO   10 mg at 12/17/19 1054  . losartan (COZAAR) tablet 25 mg  25 mg Oral Daily Jennette Kettle M, DO   25 mg at 12/17/19 1055  . multivitamin (RENA-VIT) tablet 1 tablet  1 tablet Oral QPM Etta Quill, DO      . ondansetron [facility]) tablet 4 mg  4 mg Oral Q6H PRN Etta Quill, DO       Or  . ondansetron [facility]) injection 4 mg  4 mg Intravenous Q6H PRN Etta Quill, DO      . pantoprazole (PROTONIX) EC tablet 40 mg  40 mg Oral Daily Jennette Kettle M, DO   40 mg at 12/17/19 1055  . remdesivir 100 mg in sodium chloride 0.9 % 100 mL IVPB  100 mg Intravenous Daily Jennette Kettle M, DO 200 mL/hr at 12/17/19 0919 100 mg at 12/17/19 0919  . rosuvastatin (CRESTOR) tablet 10 mg  10 mg Oral Q M,W,F Etta Quill, DO   10 mg at 12/17/19 [id number]   Current Outpatient Medications  Medication Sig Dispense Refill  . ACCU-CHEK GUIDE test strip USE TO CHECK BG UP TO 4 TIMES A DAY (DX: E11.22 - DIABETES WITH CKD AND INSULIN THERAPY) IN VITRO  1  . allopurinol (ZYLOPRIM) 100 MG tablet Take 200 mg by mouth daily.     Marland Kitchen aspirin 81 MG tablet Take 81 mg by mouth daily.    . B Complex-C-Zn-Folic Acid (DIALYVITE/ZINC) TABS Take 1 tablet by mouth every evening.   3  . calcitRIOL  (ROCALTROL) 0.25 MCG capsule Take 0.25 mcg by mouth daily.    . calcium acetate (PHOSLO) 667 MG capsule Take 667 mg by mouth 3 (three) times daily with meals.   3  . Calcium Carbonate Antacid (TUMS E-X PO) Take 1 tablet by mouth daily.     . carvedilol (COREG) 25 MG tablet Take 25 mg by mouth 2 (two) times daily with a meal.    . escitalopram (LEXAPRO) 5 MG tablet Take 5 mg by mouth daily.     . famotidine (PEPCID) 20 MG tablet Take 20 mg by mouth daily.    . furosemide (LASIX) 40 MG tablet Take 80 mg by mouth daily.     . insulin NPH-regular Human (HUMULIN 70/30) (70-30) 100 UNIT/ML injection Inject 26 Units into the skin daily with breakfast. Inject 26 Units into the skin daily with breakfast. (Patient taking differently: Inject 28 Units into the skin daily with breakfast. Inject 28 Units into the skin daily with breakfast.) 8 mL 2  . Iron-FA-B Cmp-C-Biot-Probiotic (FUSION PLUS PO) Take 1 tablet by mouth daily.     Marland Kitchen lisinopril (ZESTRIL) 10 MG tablet Take 10 mg by mouth daily.     Marland Kitchen loratadine (CLARITIN) 10 MG tablet Take 10 mg by mouth daily.    Marland Kitchen losartan (COZAAR) 25 MG tablet Take 25 mg by mouth daily.    Marland Kitchen omeprazole (PRILOSEC) 20 MG capsule Take 1 capsule (20 mg total) by mouth daily. 30 capsule 0  . RELION INSULIN SYR 0.5ML/31G 31G X 5/16" 0.5 ML MISC USE 1 ONCE DAILY    . rosuvastatin (CRESTOR) 10 MG tablet Take 10 mg by mouth every Monday, Wednesday, and Friday.    Marland Kitchen  triamcinolone cream (KENALOG) 0.1 % Apply 1 application topically daily as needed (apply to face and Groin area).      Labs: Basic Metabolic Panel: Recent Labs  Lab 12/16/19 1125 12/17/19 0411  NA 137 136  K 3.2* 3.7  CL 94* 95*  CO2 29 26  GLUCOSE 132* 118*  BUN 18 31*  CREATININE 4.95* 7.47*  CALCIUM 8.3* 7.8*   Liver Function Tests: Recent Labs  Lab 12/17/19 0411  AST 38  ALT 21  ALKPHOS 47  BILITOT 0.6  PROT 6.1*  ALBUMIN 2.9*   No results for input(s): LIPASE, AMYLASE in the last 168 hours. No  results for input(s): AMMONIA in the last 168 hours. CBC: Recent Labs  Lab 12/16/19 1125 12/17/19 0411  WBC 3.9* 2.8*  NEUTROABS 2.7 2.0  HGB 11.1* 10.0*  HCT 34.6* 31.2*  MCV 91.5 91.5  PLT 169 144*   Cardiac Enzymes: No results for input(s): CKTOTAL, CKMB, CKMBINDEX, TROPONINI in the last 168 hours. CBG: Recent Labs  Lab 12/16/19 2206 12/16/19 2358 12/17/19 0731 12/17/19 1213  GLUCAP 149* 145* 123* 158*   Iron Studies: No results for input(s): IRON, TIBC, TRANSFERRIN, FERRITIN in the last 72 hours. Studies/Results: CT Angio Head W or Wo Contrast  Result Date: 12/16/2019 CLINICAL DATA:  Stroke. Progressive acute mental status changes during dialysis. EXAM: CT ANGIOGRAPHY HEAD AND NECK CT PERFUSION BRAIN TECHNIQUE: Multidetector CT imaging of the head and neck was performed using the standard protocol during bolus administration of intravenous contrast. Multiplanar CT image reconstructions and MIPs were obtained to evaluate the vascular anatomy. Carotid stenosis measurements (when applicable) are obtained utilizing NASCET criteria, using the distal internal carotid diameter as the denominator. Multiphase CT imaging of the brain was performed following IV bolus contrast injection. Subsequent parametric perfusion maps were calculated using RAPID software. CONTRAST:  19mL OMNIPAQUE IOHEXOL 350 MG/ML SOLN COMPARISON:  MR head without contrast 06/26/2016 FINDINGS: CT HEAD FINDINGS Brain: No acute infarct, hemorrhage, or mass lesion is present. The ventricles are of normal size. No significant white matter lesions are present. The brainstem and cerebellum are within normal limits. Vascular: Atherosclerotic calcifications are present within the cavernous internal carotid arteries bilaterally. There is no hyperdense vessel. Skull: Calvarium is intact. No focal lytic or blastic lesions are present. No significant extracranial soft tissue lesion is present. Sinuses/Orbits: The paranasal sinuses  and mastoid air cells are clear. The globes and orbits are within normal limits. ASPECTS ([place] Stroke Program Early CT Score) - Ganglionic level infarction (caudate, lentiform nuclei, internal capsule, insula, M1-M3 cortex): 7/7 - Supraganglionic infarction (M4-M6 cortex): 3/3 Total score (0-10 with 10 being normal): 10/10 Review of the MIP images confirms the above findings CTA NECK FINDINGS Aortic arch: A 3 vessel arch configuration is present. There is no significant atherosclerotic disease or stenosis. There is no aneurysm. Right carotid system: The proximal right common carotid artery is obscured by motion artifact. There is no significant stenosis in the right common carotid artery. Right MCA bifurcation is within normal limits. Cervical right ICA is normal. Left carotid system: The left common carotid artery is somewhat distorted by motion. No focal stenosis is present. Atherosclerotic calcifications are present at the bifurcation without significant stenosis. Cervical left ICA is otherwise normal. Vertebral arteries: The right vertebral artery is the dominant vessel. There is some distortion at the C4-5 level. No other significant stenosis is present. Vertebral artery origins are within normal limits bilaterally. Skeleton: Is straightening and reversal of some the normal cervical lordosis.  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 Grade 1 retrolisthesis is likely exaggerated by artifact at C4-5. Focal lytic or blastic lesions are present. Other neck: The soft tissues the neck are otherwise unremarkable. Upper chest: Posterior airspace opacities are present on the right. The left lung is clear. Thoracic inlet is normal. Review of the MIP images confirms the above findings CTA HEAD FINDINGS Anterior circulation: Atherosclerotic calcifications are present within the cavernous internal carotid arteries bilaterally without a significant stenosis relative to the more distal  vessels. ICA termini are normal bilaterally. The left A1 segment is dominant. The right A1 segment is hypoplastic or occluded. The anterior communicating artery is patent and both A2 segments fill. M1 segments are normal bilaterally. The MCA bifurcations are intact. There is some irregularity of distal ACA and MCA branch vessels without a significant proximal stenosis or occlusion. No aneurysm is present. Posterior circulation: The right vertebral artery is the dominant vessel. The left vertebral artery terminates at the PICA. The right AICA vessel is dominant. The basilar artery is normal. Both posterior cerebral arteries originate from the basilar tip. There is mild irregularity of the left P2 segment with a 50% stenosis. Mild irregularity is present in the distal PCA branch vessels without other significant proximal stenosis or occlusion. Venous sinuses: The dural sinuses are patent. The straight sinus and deep cerebral veins are intact. Cortical veins are within normal limits. Anatomic variants: None Review of the MIP images confirms the above findings CT Brain Perfusion Findings: ASPECTS: 10/10 CBF (<30%) Volume: 57mL Perfusion (Tmax>6.0s) volume: 64mL Mismatch Volume: 57mL Infarction Location:n/a IMPRESSION: 1. CT perfusion demonstrates no acute infarct or significant ischemia. 2. No emergent large vessel occlusion. 3. Absence of the right A1 segment is likely due to a hypoplastic or chronically occluded vessel. Anterior communicating artery and both A2 segments are normal. 4. Atherosclerotic changes are noted at the left carotid bifurcation and cavernous internal carotid arteries bilaterally without significant stenosis. 5. Hypoplastic left vertebral artery terminates at the PICA. 6. Mild narrowing of the proximal left PCA. 7. Mild diffuse distal small vessel disease without significant proximal stenosis or occlusion elsewhere within the circle[place]. These results were called by telephone at the time of  interpretation on 12/16/2019 at 4:45 pm to provider [facility] , who verbally acknowledged these results. Electronically Signed   By: San Morelle M.D.   On: 12/16/2019 16:46   DG Chest 2 View  Result Date: 12/16/2019 CLINICAL DATA:  Altered mental status. EXAM: CHEST - 2 VIEW COMPARISON:  PA and lateral chest 06/26/2016. FINDINGS: Heart size is upper normal with pulmonary vascular congestion. No consolidative process, pneumothorax or effusion. No acute or focal bony abnormality. IMPRESSION: Pulmonary vascular congestion.  No acute disease. Electronically Signed   By: Inge Rise M.D.   On: 12/16/2019 15:58   CT Angio Neck W and/or Wo Contrast  Result Date: 12/16/2019 CLINICAL DATA:  Stroke. Progressive acute mental status changes during dialysis. EXAM: CT ANGIOGRAPHY HEAD AND NECK CT PERFUSION BRAIN TECHNIQUE: Multidetector CT imaging of the head and neck was performed using the standard protocol during bolus administration of intravenous contrast. Multiplanar CT image reconstructions and MIPs were obtained to evaluate the vascular anatomy. Carotid stenosis measurements (when applicable) are obtained utilizing NASCET criteria, using the distal internal carotid diameter as the denominator. Multiphase CT imaging of the brain was performed following IV bolus contrast injection. Subsequent parametric perfusion maps were calculated using RAPID software. CONTRAST:  155mL OMNIPAQUE IOHEXOL 350 MG/ML  SOLN COMPARISON:  MR head without contrast 06/26/2016 FINDINGS: CT HEAD FINDINGS Brain: No acute infarct, hemorrhage, or mass lesion is present. The ventricles are of normal size. No significant white matter lesions are present. The brainstem and cerebellum are within normal limits. Vascular: Atherosclerotic calcifications are present within the cavernous internal carotid arteries bilaterally. There is no hyperdense vessel. Skull: Calvarium is intact. No focal lytic or blastic lesions are  present. No significant extracranial soft tissue lesion is present. Sinuses/Orbits: The paranasal sinuses and mastoid air cells are clear. The globes and orbits are within normal limits. ASPECTS ([place] Stroke Program Early CT Score) - Ganglionic level infarction (caudate, lentiform nuclei, internal capsule, insula, M1-M3 cortex): 7/7 - Supraganglionic infarction (M4-M6 cortex): 3/3 Total score (0-10 with 10 being normal): 10/10 Review of the MIP images confirms the above findings CTA NECK FINDINGS Aortic arch: A 3 vessel arch configuration is present. There is no significant atherosclerotic disease or stenosis. There is no aneurysm. Right carotid system: The proximal right common carotid artery is obscured by motion artifact. There is no significant stenosis in the right common carotid artery. Right MCA bifurcation is within normal limits. Cervical right ICA is normal. Left carotid system: The left common carotid artery is somewhat distorted by motion. No focal stenosis is present. Atherosclerotic calcifications are present at the bifurcation without significant stenosis. Cervical left ICA is otherwise normal. Vertebral arteries: The right vertebral artery is the dominant vessel. There is some distortion at the C4-5 level. No other significant stenosis is present. Vertebral artery origins are within normal limits bilaterally. Skeleton: Is straightening and reversal of some the normal cervical lordosis. 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 Grade 1 retrolisthesis is likely exaggerated by artifact at C4-5. Focal lytic or blastic lesions are present. Other neck: The soft tissues the neck are otherwise unremarkable. Upper chest: Posterior airspace opacities are present on the right. The left lung is clear. Thoracic inlet is normal. Review of the MIP images confirms the above findings CTA HEAD FINDINGS Anterior circulation: Atherosclerotic calcifications are present within the  cavernous internal carotid arteries bilaterally without a significant stenosis relative to the more distal vessels. ICA termini are normal bilaterally. The left A1 segment is dominant. The right A1 segment is hypoplastic or occluded. The anterior communicating artery is patent and both A2 segments fill. M1 segments are normal bilaterally. The MCA bifurcations are intact. There is some irregularity of distal ACA and MCA branch vessels without a significant proximal stenosis or occlusion. No aneurysm is present. Posterior circulation: The right vertebral artery is the dominant vessel. The left vertebral artery terminates at the PICA. The right AICA vessel is dominant. The basilar artery is normal. Both posterior cerebral arteries originate from the basilar tip. There is mild irregularity of the left P2 segment with a 50% stenosis. Mild irregularity is present in the distal PCA branch vessels without other significant proximal stenosis or occlusion. Venous sinuses: The dural sinuses are patent. The straight sinus and deep cerebral veins are intact. Cortical veins are within normal limits. Anatomic variants: None Review of the MIP images confirms the above findings CT Brain Perfusion Findings: ASPECTS: 10/10 CBF (<30%) Volume: 41mL Perfusion (Tmax>6.0s) volume: 43mL Mismatch Volume: 2mL Infarction Location:n/a IMPRESSION: 1. CT perfusion demonstrates no acute infarct or significant ischemia. 2. No emergent large vessel occlusion. 3. Absence of the right A1 segment is likely due to a hypoplastic or chronically occluded vessel. Anterior communicating artery and both A2 segments are normal. 4.  Atherosclerotic changes are noted at the left carotid bifurcation and cavernous internal carotid arteries bilaterally without significant stenosis. 5. Hypoplastic left vertebral artery terminates at the PICA. 6. Mild narrowing of the proximal left PCA. 7. Mild diffuse distal small vessel disease without significant proximal stenosis or  occlusion elsewhere within the circle[place]. These results were called by telephone at the time of interpretation on 12/16/2019 at 4:45 pm to provider [facility] , who verbally acknowledged these results. Electronically Signed   By: San Morelle M.D.   On: 12/16/2019 16:46   CT CEREBRAL PERFUSION W CONTRAST  Result Date: 12/16/2019 CLINICAL DATA:  Stroke. Progressive acute mental status changes during dialysis. EXAM: CT ANGIOGRAPHY HEAD AND NECK CT PERFUSION BRAIN TECHNIQUE: Multidetector CT imaging of the head and neck was performed using the standard protocol during bolus administration of intravenous contrast. Multiplanar CT image reconstructions and MIPs were obtained to evaluate the vascular anatomy. Carotid stenosis measurements (when applicable) are obtained utilizing NASCET criteria, using the distal internal carotid diameter as the denominator. Multiphase CT imaging of the brain was performed following IV bolus contrast injection. Subsequent parametric perfusion maps were calculated using RAPID software. CONTRAST:  145mL OMNIPAQUE IOHEXOL 350 MG/ML SOLN COMPARISON:  MR head without contrast 06/26/2016 FINDINGS: CT HEAD FINDINGS Brain: No acute infarct, hemorrhage, or mass lesion is present. The ventricles are of normal size. No significant white matter lesions are present. The brainstem and cerebellum are within normal limits. Vascular: Atherosclerotic calcifications are present within the cavernous internal carotid arteries bilaterally. There is no hyperdense vessel. Skull: Calvarium is intact. No focal lytic or blastic lesions are present. No significant extracranial soft tissue lesion is present. Sinuses/Orbits: The paranasal sinuses and mastoid air cells are clear. The globes and orbits are within normal limits. ASPECTS ([place] Stroke Program Early CT Score) - Ganglionic level infarction (caudate, lentiform nuclei, internal capsule, insula, M1-M3 cortex): 7/7 - Supraganglionic  infarction (M4-M6 cortex): 3/3 Total score (0-10 with 10 being normal): 10/10 Review of the MIP images confirms the above findings CTA NECK FINDINGS Aortic arch: A 3 vessel arch configuration is present. There is no significant atherosclerotic disease or stenosis. There is no aneurysm. Right carotid system: The proximal right common carotid artery is obscured by motion artifact. There is no significant stenosis in the right common carotid artery. Right MCA bifurcation is within normal limits. Cervical right ICA is normal. Left carotid system: The left common carotid artery is somewhat distorted by motion. No focal stenosis is present. Atherosclerotic calcifications are present at the bifurcation without significant stenosis. Cervical left ICA is otherwise normal. Vertebral arteries: The right vertebral artery is the dominant vessel. There is some distortion at the C4-5 level. No other significant stenosis is present. Vertebral artery origins are within normal limits bilaterally. Skeleton: Is straightening and reversal of some the normal cervical lordosis. 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 Grade 1 retrolisthesis is likely exaggerated by artifact at C4-5. Focal lytic or blastic lesions are present. Other neck: The soft tissues the neck are otherwise unremarkable. Upper chest: Posterior airspace opacities are present on the right. The left lung is clear. Thoracic inlet is normal. Review of the MIP images confirms the above findings CTA HEAD FINDINGS Anterior circulation: Atherosclerotic calcifications are present within the cavernous internal carotid arteries bilaterally without a significant stenosis relative to the more distal vessels. ICA termini are normal bilaterally. The left A1 segment is dominant. The right A1 segment is hypoplastic or occluded. The anterior communicating  artery is patent and both A2 segments fill. M1 segments are normal bilaterally. The MCA bifurcations  are intact. There is some irregularity of distal ACA and MCA branch vessels without a significant proximal stenosis or occlusion. No aneurysm is present. Posterior circulation: The right vertebral artery is the dominant vessel. The left vertebral artery terminates at the PICA. The right AICA vessel is dominant. The basilar artery is normal. Both posterior cerebral arteries originate from the basilar tip. There is mild irregularity of the left P2 segment with a 50% stenosis. Mild irregularity is present in the distal PCA branch vessels without other significant proximal stenosis or occlusion. Venous sinuses: The dural sinuses are patent. The straight sinus and deep cerebral veins are intact. Cortical veins are within normal limits. Anatomic variants: None Review of the MIP images confirms the above findings CT Brain Perfusion Findings: ASPECTS: 10/10 CBF (<30%) Volume: 66mL Perfusion (Tmax>6.0s) volume: 51mL Mismatch Volume: 59mL Infarction Location:n/a IMPRESSION: 1. CT perfusion demonstrates no acute infarct or significant ischemia. 2. No emergent large vessel occlusion. 3. Absence of the right A1 segment is likely due to a hypoplastic or chronically occluded vessel. Anterior communicating artery and both A2 segments are normal. 4. Atherosclerotic changes are noted at the left carotid bifurcation and cavernous internal carotid arteries bilaterally without significant stenosis. 5. Hypoplastic left vertebral artery terminates at the PICA. 6. Mild narrowing of the proximal left PCA. 7. Mild diffuse distal small vessel disease without significant proximal stenosis or occlusion elsewhere within the [place]. These results were called by telephone at the time of interpretation on 12/16/2019 at 4:45 pm to provider [facility] , who verbally acknowledged these results. Electronically Signed   By: San Morelle M.D.   On: 12/16/2019 16:46    ROS: As per HPI otherwise negative. Physical Exam: Vitals:    12/17/19 1200 12/17/19 1245 12/17/19 1300 12/17/19 1330  BP: (!) 156/66  (!) 169/75   Pulse: 79 79 81 79  Resp: (!) 22 20 (!) 21 15  Temp:      TempSrc:      SpO2: 94% 96% 99% 99%     Patient was not directly examined today because of COVID-19 status in order to minimize exposure and preserve PPE.  The physical examination  And planof care of the patient was discussed with her ED RN. Dialysis Access: AVF left upper  Dialysis Orders: TTS first shift EDW 87 GO 400/ A 1.5 2 K 2.25 Ca AVF profile 4 heparin 8400 Mircera 30 q 4 - last 12/3 calcitriol 0.75 no Fe  Assessment/Plan: 1. COVID - on remdesivir /decadron 2. ESRD -  TTS - HD tomorrow K 3.7- start on added K bath - dialyze when transferred to the floor 3. Hypertension/volume  - pul vasc congestion on CXR yesterday -home  BP meds losartan 25 and coreg 25 bid- have stopped lisinopril- titrate EDW down while here  4. Anemia  - hgb 10 - hgb fairly stable - las tsat 50% - continue on same ESA schedule - due for redose next week 5. Metabolic bone disease -  Continue calcitriol with HD only with HD and tid phoslo 6. Nutrition - alb 2.9 - renal /carb mod - at risk for weight loss - add nepro 7. DM - per primary 8. AMS  - disoriented yesterday at dialysis  - secondary to fever, better now   Myriam Jacobson, PA-C [facility] [phone] 12/17/2019, 2:26 PM

## 2019-12-17 NOTE — Progress Notes (Signed)
PROGRESS NOTE    Katie Garcia  [id number] DOB: [birth year] DOA: 12/16/2019 PCP: [name], MD    Brief Narrative:  78 year old female who presented with altered mental status.  She does have significant past medical history for end-stage renal disease, hypertension and type 2 diabetes mellitus.  She was brought to the hospital due to progressive altered mentation during dialysis.  On her initial physical examination her temperature was 102 F, blood pressure 144/82, pulse rate 84, oxygen saturation 92 to 96%, her lungs are clear to auscultation bilaterally, heart S1-S2 present and rhythmic, abdomen soft, no lower extremity edema. Sodium 137, potassium 3.2, chloride 94, bicarb 29, glucose 132, BUN 18, creatinine 4.9, white count 3.9, hemoglobin 11.1, hematocrit 34.6, platelets 169.  SARS COVID-19 positive.  Chest radiograph with bilateral interstitial infiltrates, predominantly left base, personally reviewed.  CT angio head and neck no acute changes.  EKG 85 bpm, normal axis, normal intervals, normal sinus, no ST segment or T wave changes.  Patient was admitted to the hospital working diagnosis of acute metabolic encephalopathy in the setting of SARS COVID-19 infection.   Assessment & Plan:   Principal Problem:   COVID-19 virus infection Active Problems:   DM (diabetes mellitus), secondary uncontrolled ([place])   ESRD (end stage renal disease) ([place])   Acute metabolic encephalopathy   1. Acute viral pneumonia due to SARS COVID 19.  RR: 11  Pulse oxymetry: 94 to 100  Fi02: 21% room air.  COVID-19 Labs  Recent Labs    12/17/19 0130 12/17/19 0411  DDIMER 1.55* 1.60*  CRP 5.2* 5.6*    Lab Results  Component Value Date   SARSCOV2NAA POSITIVE (A) 12/16/2019    Stable inflammatory markers.   Will continue medical therapy with Remdesivir # 1/5. Patient with elevated inflammatory markers and significant risk factors for worsening COVID 19 viral pneumonia, will add  systemic steroids with dexamethasone 6 mg Iv q 24H.   Continue with antitussive agents and airway clearing techniques with flutter valve and incentive spirometer.   2. Metabolic encephalopathy with delirium. Multifactorial, clinically has been improving, continue neuro checks per unit protocol and supportive medical therapy for viral pneumonia.   3. ESRD on HD. Patient clinically euvolemic, K at 3.7 with serum bicarbonate at 26. Will continue to follow nephrology recommendations for further renal replacement therapy during her hospitalization.   4. HTN. Continue blood pressure control. Continue with carvedilol and losartan.    5. Uncontrolled T2DM (Hgb A1c at 8,6)/ dysplidemia. Will continue glucose cover and monitoring with insulin sliding scale and basal insulin 10 units glargine. Continue statin therapy with rosuvastatin.    DVT prophylaxis: enoxaparin   Code Status:  full Family Communication: no family at the bedside  Disposition Plan/ discharge barriers: pending clinical improvement.    Consultants:   Nephrology   Procedures:     Antimicrobials:       Subjective: Patient with improved symptoms but not yet back to baseline, positive dyspnea and cough, no nausea or vomiting, no chest pain.   Objective: Vitals:   12/17/19 0425 12/17/19 0500 12/17/19 0600 12/17/19 0700  BP:  (!) 164/84 (!) 142/66 (!) 174/77  Pulse: 82 85 76 80  Resp: 12 16 20 19   Temp:      TempSrc:      SpO2: 100% 99% 97% 99%   No intake or output data in the 24 hours ending 12/17/19 0755 There were no vitals filed for this visit.  Examination:   General: Not in pain  or dyspnea, deconditioned  Neurology: Awake and alert, non focal  E ENT: mild pallor, no icterus, oral mucosa moist Cardiovascular: No JVD. S1-S2 present, rhythmic, no gallops, rubs, or murmurs. No lower extremity edema. Pulmonary: positive breath sounds bilaterally. Gastrointestinal. Abdomen with no organomegaly, non tender, no  rebound or guarding Skin. No rashes Musculoskeletal: no joint deformities     Data Reviewed: I have personally reviewed following labs and imaging studies  CBC: Recent Labs  Lab 12/16/19 1125 12/17/19 0411  WBC 3.9* 2.8*  NEUTROABS 2.7 2.0  HGB 11.1* 10.0*  HCT 34.6* 31.2*  MCV 91.5 91.5  PLT 169 123456*   Basic Metabolic Panel: Recent Labs  Lab 12/16/19 1125 12/17/19 0411  NA 137 136  K 3.2* 3.7  CL 94* 95*  CO2 29 26  GLUCOSE 132* 118*  BUN 18 31*  CREATININE 4.95* 7.47*  CALCIUM 8.3* 7.8*   GFR: CrCl cannot be calculated (Unknown ideal weight.). Liver Function Tests: Recent Labs  Lab 12/17/19 0411  AST 38  ALT 21  ALKPHOS 47  BILITOT 0.6  PROT 6.1*  ALBUMIN 2.9*   No results for input(s): LIPASE, AMYLASE in the last 168 hours. No results for input(s): AMMONIA in the last 168 hours. Coagulation Profile: No results for input(s): INR, PROTIME in the last 168 hours. Cardiac Enzymes: No results for input(s): CKTOTAL, CKMB, CKMBINDEX, TROPONINI in the last 168 hours. BNP (last 3 results) No results for input(s): PROBNP in the last 8760 hours. HbA1C: Recent Labs    12/17/19 0130  HGBA1C 8.6*   CBG: Recent Labs  Lab 12/16/19 2206 12/16/19 2358 12/17/19 0731  GLUCAP 149* 145* 123*   Lipid Profile: No results for input(s): CHOL, HDL, LDLCALC, TRIG, CHOLHDL, LDLDIRECT in the last 72 hours. Thyroid Function Tests: No results for input(s): TSH, T4TOTAL, FREET4, T3FREE, THYROIDAB in the last 72 hours. Anemia Panel: No results for input(s): VITAMINB12, FOLATE, FERRITIN, TIBC, IRON, RETICCTPCT in the last 72 hours.    Radiology Studies: I have reviewed all of the imaging during this hospital visit personally     Scheduled Meds: . allopurinol  200 mg Oral Daily  . aspirin EC  81 mg Oral Daily  . calcitRIOL  0.25 mcg Oral Daily  . calcium acetate  667 mg Oral TID WC  . carvedilol  25 mg Oral BID WC  . escitalopram  5 mg Oral Daily  .  famotidine  20 mg Oral Daily  . furosemide  80 mg Oral Daily  . heparin  5,000 Units Subcutaneous Q8H  . insulin aspart  0-9 Units Subcutaneous TID WC  . insulin glargine  10 Units Subcutaneous Daily  . lisinopril  10 mg Oral Daily  . loratadine  10 mg Oral Daily  . losartan  25 mg Oral Daily  . multivitamin  1 tablet Oral QPM  . pantoprazole  40 mg Oral Daily  . rosuvastatin  10 mg Oral Q M,W,F   Continuous Infusions: . remdesivir 100 mg in NS 100 mL 100 mg (12/17/19 0754)     LOS: 1 day        Ebbie Cherry Gerome Apley, MD

## 2019-12-18 LAB — CBC WITH DIFFERENTIAL/PLATELET
Abs Immature Granulocytes: 0.01 10*3/uL (ref 0.00–0.07)
Basophils Absolute: 0 10*3/uL (ref 0.0–0.1)
Basophils Relative: 0 %
Eosinophils Absolute: 0 10*3/uL (ref 0.0–0.5)
Eosinophils Relative: 0 %
HCT: 30.4 % — ABNORMAL LOW (ref 36.0–46.0)
Hemoglobin: 10 g/dL — ABNORMAL LOW (ref 12.0–15.0)
Immature Granulocytes: 0 %
Lymphocytes Relative: 12 %
Lymphs Abs: 0.4 10*3/uL — ABNORMAL LOW (ref 0.7–4.0)
MCH: 29.3 pg (ref 26.0–34.0)
MCHC: 32.9 g/dL (ref 30.0–36.0)
MCV: 89.1 fL (ref 80.0–100.0)
Monocytes Absolute: 0.3 10*3/uL (ref 0.1–1.0)
Monocytes Relative: 9 %
Neutro Abs: 2.8 10*3/uL (ref 1.7–7.7)
Neutrophils Relative %: 79 %
Platelets: 146 10*3/uL — ABNORMAL LOW (ref 150–400)
RBC: 3.41 MIL/uL — ABNORMAL LOW (ref 3.87–5.11)
RDW: 14.5 % (ref 11.5–15.5)
WBC: 3.6 10*3/uL — ABNORMAL LOW (ref 4.0–10.5)
nRBC: 0 % (ref 0.0–0.2)

## 2019-12-18 LAB — COMPREHENSIVE METABOLIC PANEL
ALT: 24 U/L (ref 0–44)
AST: 50 U/L — ABNORMAL HIGH (ref 15–41)
Albumin: 2.9 g/dL — ABNORMAL LOW (ref 3.5–5.0)
Alkaline Phosphatase: 50 U/L (ref 38–126)
Anion gap: 19 — ABNORMAL HIGH (ref 5–15)
BUN: 60 mg/dL — ABNORMAL HIGH (ref 8–23)
CO2: 22 mmol/L (ref 22–32)
Calcium: 7.8 mg/dL — ABNORMAL LOW (ref 8.9–10.3)
Chloride: 91 mmol/L — ABNORMAL LOW (ref 98–111)
Creatinine, Ser: 10.31 mg/dL — ABNORMAL HIGH (ref 0.44–1.00)
GFR calc Af Amer: 4 mL/min — ABNORMAL LOW (ref >60)
GFR calc non Af Amer: 3 mL/min — ABNORMAL LOW (ref >60)
Glucose, Bld: 223 mg/dL — ABNORMAL HIGH (ref 70–99)
Potassium: 3.9 mmol/L (ref 3.5–5.1)
Sodium: 132 mmol/L — ABNORMAL LOW (ref 135–145)
Total Bilirubin: 0.2 mg/dL — ABNORMAL LOW (ref 0.3–1.2)
Total Protein: 6.2 g/dL — ABNORMAL LOW (ref 6.5–8.1)

## 2019-12-18 LAB — D-DIMER, QUANTITATIVE: D-Dimer, Quant: 1.51 ug/mL-FEU — ABNORMAL HIGH (ref 0.00–0.50)

## 2019-12-18 LAB — GLUCOSE, CAPILLARY
Glucose-Capillary: 215 mg/dL — ABNORMAL HIGH (ref 70–99)
Glucose-Capillary: 137 mg/dL — ABNORMAL HIGH (ref 70–99)
Glucose-Capillary: 213 mg/dL — ABNORMAL HIGH (ref 70–99)
Glucose-Capillary: 244 mg/dL — ABNORMAL HIGH (ref 70–99)

## 2019-12-18 LAB — C-REACTIVE PROTEIN: CRP: 7.6 mg/dL — ABNORMAL HIGH (ref <1.0)

## 2019-12-18 LAB — MRSA PCR SCREENING: MRSA by PCR: NEGATIVE

## 2019-12-18 MED ORDER — DARBEPOETIN ALFA 40 MCG/0.4ML IJ SOSY
PREFILLED_SYRINGE | INTRAMUSCULAR | Status: AC
  Administered 2019-12-18: 40 ug via INTRAVENOUS
  Filled 2019-12-18: qty 0.4

## 2019-12-18 MED ORDER — PNEUMOCOCCAL VAC POLYVALENT 25 MCG/0.5ML IJ INJ
0.5000 mL | INJECTION | INTRAMUSCULAR | Status: DC
  Filled 2019-12-18: qty 0.5

## 2019-12-18 MED ORDER — INSULIN GLARGINE 100 UNIT/ML ~~LOC~~ SOLN
20.0000 [IU] | Freq: Every day | SUBCUTANEOUS | Status: DC
  Administered 2019-12-19 – 2019-12-20 (×2): 20 [IU] via SUBCUTANEOUS
  Filled 2019-12-18 (×2): qty 0.2

## 2019-12-18 MED ORDER — INSULIN GLARGINE 100 UNIT/ML ~~LOC~~ SOLN
10.0000 [IU] | Freq: Once | SUBCUTANEOUS | Status: AC
  Administered 2019-12-18: 10 [IU] via SUBCUTANEOUS
  Filled 2019-12-18: qty 0.1

## 2019-12-18 NOTE — Progress Notes (Signed)
PT Cancellation Note  Patient Details Name: Katie Garcia MRN: [id number] DOB: Jul 23, 1941   Cancelled Treatment:    Reason Eval/Treat Not Completed: (P) Patient at procedure or test/unavailable Pt off floor for hemodialysis. PT will follow back for Buckhorn [name] PT, DPT Acute Rehabilitation Services Pager [phone] Office [phone]   [place] 12/18/2019, 2:32 PM

## 2019-12-18 NOTE — Plan of Care (Signed)

## 2019-12-18 NOTE — Progress Notes (Signed)
OT Cancellation Note  Patient Details Name: Shondra Binion Knezevic MRN: [id number] DOB: 1941-12-23   Cancelled Treatment:    Reason Eval/Treat Not Completed: Patient at procedure or test/ unavailable.  Pt in HD.  Nilsa Nutting., OTR/L Acute Rehabilitation Services Pager [phone] Office [phone]   Lucille Passy M 12/18/2019, 1:19 PM

## 2019-12-18 NOTE — Progress Notes (Signed)
Received report from ED.  

## 2019-12-18 NOTE — Progress Notes (Signed)
[facility] Progress Note   Dialysis Orders: TTS first shift EDW 87 GO 400/ A 1.5 2 K 2.25 Ca AVF profile 4 heparin 8400 Mircera 30 q 4 - last 12/3 calcitriol 0.75 no Fe  Assessment/Plan: 1. COVID - on remdesivir /decadron 2. ESRD -  TTS - HD being intiated k 3.9 - next HD will be Sunday due to holiday schedule 3. Hypertension/volume  - pul vasc congestion on CXR yesterday -home  BP meds losartan 25 and coreg 25 bid- have stopped lisinopril- titrate EDW down while here  4. Anemia  - hgb 10  fairly stable - last tsat 50% - continue on same ESA schedule - due for redose next week 5. Metabolic bone disease -  Continue calcitriol with HD only with HD and tid phoslo 6. Nutrition - alb 2.9 - renal /carb mod - at risk for weight loss - add nepro 7. DM - per primary 8. AMS  - disoriented yesterday at dialysis the date of admission - secondary to fever, better now   Myriam Jacobson, PA-C [place] [phone] 12/18/2019,11:56 AM  LOS: 2 days   Subjective:   Stable overnight - BP up some pre HD - low grade temp  Objective Vitals:   12/18/19 1059 12/18/19 1105 12/18/19 1115 12/18/19 1130  BP: (!) 144/65 (!) 167/70 138/69 (!) 147/65  Pulse: 76 75 79 75  Resp:  18    Temp:      TempSrc:      SpO2:      Weight:      Height:       Patient was not directly examined today because of COVID-19 status in order to minimize exposure and preserve PPE. Her chart and pertinent findings were reviewed. Dialysis Access: AVF left upper   Additional Objective Labs: Basic Metabolic Panel: Recent Labs  Lab 12/16/19 1125 12/17/19 0411 12/18/19 0659  NA 137 136 132*  K 3.2* 3.7 3.9  CL 94* 95* 91*  CO2 29 26 22   GLUCOSE 132* 118* 223*  BUN 18 31* 60*  CREATININE 4.95* 7.47* 10.31*  CALCIUM 8.3* 7.8* 7.8*   Liver Function Tests: Recent Labs  Lab 12/17/19 0411 12/18/19 0659  AST 38 50*  ALT 21 24  ALKPHOS 47 50  BILITOT 0.6 0.2*  PROT 6.1* 6.2*   ALBUMIN 2.9* 2.9*   No results for input(s): LIPASE, AMYLASE in the last 168 hours. CBC: Recent Labs  Lab 12/16/19 1125 12/17/19 0411 12/18/19 0659  WBC 3.9* 2.8* 3.6*  NEUTROABS 2.7 2.0 2.8  HGB 11.1* 10.0* 10.0*  HCT 34.6* 31.2* 30.4*  MCV 91.5 91.5 89.1  PLT 169 144* 146*   Blood Culture No results found for: SDES, SPECREQUEST, CULT, REPTSTATUS  Cardiac Enzymes: No results for input(s): CKTOTAL, CKMB, CKMBINDEX, TROPONINI in the last 168 hours. CBG: Recent Labs  Lab 12/17/19 0731 12/17/19 1213 12/17/19 1640 12/17/19 2240 12/18/19 0754  GLUCAP 123* 158* 227* 301* 215*   Iron Studies: No results for input(s): IRON, TIBC, TRANSFERRIN, FERRITIN in the last 72 hours. No results found for: INR, PROTIME Studies/Results: CT Angio Head W or Wo Contrast  Result Date: 12/16/2019 CLINICAL DATA:  Stroke. Progressive acute mental status changes during dialysis. EXAM: CT ANGIOGRAPHY HEAD AND NECK CT PERFUSION BRAIN TECHNIQUE: Multidetector CT imaging of the head and neck was performed using the standard protocol during bolus administration of intravenous contrast. Multiplanar CT image reconstructions and MIPs were obtained to evaluate the vascular anatomy. Carotid stenosis measurements (  when applicable) are obtained utilizing NASCET criteria, using the distal internal carotid diameter as the denominator. Multiphase CT imaging of the brain was performed following IV bolus contrast injection. Subsequent parametric perfusion maps were calculated using RAPID software. CONTRAST:  122mL OMNIPAQUE IOHEXOL 350 MG/ML SOLN COMPARISON:  MR head without contrast 06/26/2016 FINDINGS: CT HEAD FINDINGS Brain: No acute infarct, hemorrhage, or mass lesion is present. The ventricles are of normal size. No significant white matter lesions are present. The brainstem and cerebellum are within normal limits. Vascular: Atherosclerotic calcifications are present within the cavernous internal carotid arteries  bilaterally. There is no hyperdense vessel. Skull: Calvarium is intact. No focal lytic or blastic lesions are present. No significant extracranial soft tissue lesion is present. Sinuses/Orbits: The paranasal sinuses and mastoid air cells are clear. The globes and orbits are within normal limits. ASPECTS ([place] Stroke Program Early CT Score) - Ganglionic level infarction (caudate, lentiform nuclei, internal capsule, insula, M1-M3 cortex): 7/7 - Supraganglionic infarction (M4-M6 cortex): 3/3 Total score (0-10 with 10 being normal): 10/10 Review of the MIP images confirms the above findings CTA NECK FINDINGS Aortic arch: A 3 vessel arch configuration is present. There is no significant atherosclerotic disease or stenosis. There is no aneurysm. Right carotid system: The proximal right common carotid artery is obscured by motion artifact. There is no significant stenosis in the right common carotid artery. Right MCA bifurcation is within normal limits. Cervical right ICA is normal. Left carotid system: The left common carotid artery is somewhat distorted by motion. No focal stenosis is present. Atherosclerotic calcifications are present at the bifurcation without significant stenosis. Cervical left ICA is otherwise normal. Vertebral arteries: The right vertebral artery is the dominant vessel. There is some distortion at the C4-5 level. No other significant stenosis is present. Vertebral artery origins are within normal limits bilaterally. Skeleton: Is straightening and reversal of some the normal cervical lordosis. 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 Grade 1 retrolisthesis is likely exaggerated by artifact at C4-5. Focal lytic or blastic lesions are present. Other neck: The soft tissues the neck are otherwise unremarkable. Upper chest: Posterior airspace opacities are present on the right. The left lung is clear. Thoracic inlet is normal. Review of the MIP images confirms the above  findings CTA HEAD FINDINGS Anterior circulation: Atherosclerotic calcifications are present within the cavernous internal carotid arteries bilaterally without a significant stenosis relative to the more distal vessels. ICA termini are normal bilaterally. The left A1 segment is dominant. The right A1 segment is hypoplastic or occluded. The anterior communicating artery is patent and both A2 segments fill. M1 segments are normal bilaterally. The MCA bifurcations are intact. There is some irregularity of distal ACA and MCA branch vessels without a significant proximal stenosis or occlusion. No aneurysm is present. Posterior circulation: The right vertebral artery is the dominant vessel. The left vertebral artery terminates at the PICA. The right AICA vessel is dominant. The basilar artery is normal. Both posterior cerebral arteries originate from the basilar tip. There is mild irregularity of the left P2 segment with a 50% stenosis. Mild irregularity is present in the distal PCA branch vessels without other significant proximal stenosis or occlusion. Venous sinuses: The dural sinuses are patent. The straight sinus and deep cerebral veins are intact. Cortical veins are within normal limits. Anatomic variants: None Review of the MIP images confirms the above findings CT Brain Perfusion Findings: ASPECTS: 10/10 CBF (<30%) Volume: 36mL Perfusion (Tmax>6.0s) volume: 41mL Mismatch Volume: 60mL Infarction  Location:n/a IMPRESSION: 1. CT perfusion demonstrates no acute infarct or significant ischemia. 2. No emergent large vessel occlusion. 3. Absence of the right A1 segment is likely due to a hypoplastic or chronically occluded vessel. Anterior communicating artery and both A2 segments are normal. 4. Atherosclerotic changes are noted at the left carotid bifurcation and cavernous internal carotid arteries bilaterally without significant stenosis. 5. Hypoplastic left vertebral artery terminates at the PICA. 6. Mild narrowing of the  proximal left PCA. 7. Mild diffuse distal small vessel disease without significant proximal stenosis or occlusion elsewhere within the circle[place]. These results were called by telephone at the time of interpretation on 12/16/2019 at 4:45 pm to provider [facility] , who verbally acknowledged these results. Electronically Signed   By: San Morelle M.D.   On: 12/16/2019 16:46   DG Chest 2 View  Result Date: 12/16/2019 CLINICAL DATA:  Altered mental status. EXAM: CHEST - 2 VIEW COMPARISON:  PA and lateral chest 06/26/2016. FINDINGS: Heart size is upper normal with pulmonary vascular congestion. No consolidative process, pneumothorax or effusion. No acute or focal bony abnormality. IMPRESSION: Pulmonary vascular congestion.  No acute disease. Electronically Signed   By: Inge Rise M.D.   On: 12/16/2019 15:58   CT Angio Neck W and/or Wo Contrast  Result Date: 12/16/2019 CLINICAL DATA:  Stroke. Progressive acute mental status changes during dialysis. EXAM: CT ANGIOGRAPHY HEAD AND NECK CT PERFUSION BRAIN TECHNIQUE: Multidetector CT imaging of the head and neck was performed using the standard protocol during bolus administration of intravenous contrast. Multiplanar CT image reconstructions and MIPs were obtained to evaluate the vascular anatomy. Carotid stenosis measurements (when applicable) are obtained utilizing NASCET criteria, using the distal internal carotid diameter as the denominator. Multiphase CT imaging of the brain was performed following IV bolus contrast injection. Subsequent parametric perfusion maps were calculated using RAPID software. CONTRAST:  148mL OMNIPAQUE IOHEXOL 350 MG/ML SOLN COMPARISON:  MR head without contrast 06/26/2016 FINDINGS: CT HEAD FINDINGS Brain: No acute infarct, hemorrhage, or mass lesion is present. The ventricles are of normal size. No significant white matter lesions are present. The brainstem and cerebellum are within normal limits. Vascular:  Atherosclerotic calcifications are present within the cavernous internal carotid arteries bilaterally. There is no hyperdense vessel. Skull: Calvarium is intact. No focal lytic or blastic lesions are present. No significant extracranial soft tissue lesion is present. Sinuses/Orbits: The paranasal sinuses and mastoid air cells are clear. The globes and orbits are within normal limits. ASPECTS ([place] Stroke Program Early CT Score) - Ganglionic level infarction (caudate, lentiform nuclei, internal capsule, insula, M1-M3 cortex): 7/7 - Supraganglionic infarction (M4-M6 cortex): 3/3 Total score (0-10 with 10 being normal): 10/10 Review of the MIP images confirms the above findings CTA NECK FINDINGS Aortic arch: A 3 vessel arch configuration is present. There is no significant atherosclerotic disease or stenosis. There is no aneurysm. Right carotid system: The proximal right common carotid artery is obscured by motion artifact. There is no significant stenosis in the right common carotid artery. Right MCA bifurcation is within normal limits. Cervical right ICA is normal. Left carotid system: The left common carotid artery is somewhat distorted by motion. No focal stenosis is present. Atherosclerotic calcifications are present at the bifurcation without significant stenosis. Cervical left ICA is otherwise normal. Vertebral arteries: The right vertebral artery is the dominant vessel. There is some distortion at the C4-5 level. No other significant stenosis is present. Vertebral artery origins are within normal limits bilaterally. Skeleton: Is straightening and reversal of  some the normal cervical lordosis. 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 Grade 1 retrolisthesis is likely exaggerated by artifact at C4-5. Focal lytic or blastic lesions are present. Other neck: The soft tissues the neck are otherwise unremarkable. Upper chest: Posterior airspace opacities are present on the right. The  left lung is clear. Thoracic inlet is normal. Review of the MIP images confirms the above findings CTA HEAD FINDINGS Anterior circulation: Atherosclerotic calcifications are present within the cavernous internal carotid arteries bilaterally without a significant stenosis relative to the more distal vessels. ICA termini are normal bilaterally. The left A1 segment is dominant. The right A1 segment is hypoplastic or occluded. The anterior communicating artery is patent and both A2 segments fill. M1 segments are normal bilaterally. The MCA bifurcations are intact. There is some irregularity of distal ACA and MCA branch vessels without a significant proximal stenosis or occlusion. No aneurysm is present. Posterior circulation: The right vertebral artery is the dominant vessel. The left vertebral artery terminates at the PICA. The right AICA vessel is dominant. The basilar artery is normal. Both posterior cerebral arteries originate from the basilar tip. There is mild irregularity of the left P2 segment with a 50% stenosis. Mild irregularity is present in the distal PCA branch vessels without other significant proximal stenosis or occlusion. Venous sinuses: The dural sinuses are patent. The straight sinus and deep cerebral veins are intact. Cortical veins are within normal limits. Anatomic variants: None Review of the MIP images confirms the above findings CT Brain Perfusion Findings: ASPECTS: 10/10 CBF (<30%) Volume: 38mL Perfusion (Tmax>6.0s) volume: 2mL Mismatch Volume: 7mL Infarction Location:n/a IMPRESSION: 1. CT perfusion demonstrates no acute infarct or significant ischemia. 2. No emergent large vessel occlusion. 3. Absence of the right A1 segment is likely due to a hypoplastic or chronically occluded vessel. Anterior communicating artery and both A2 segments are normal. 4. Atherosclerotic changes are noted at the left carotid bifurcation and cavernous internal carotid arteries bilaterally without significant  stenosis. 5. Hypoplastic left vertebral artery terminates at the PICA. 6. Mild narrowing of the proximal left PCA. 7. Mild diffuse distal small vessel disease without significant proximal stenosis or occlusion elsewhere within the circle[place]of[place]. These results were called by telephone at the time of interpretation on 12/16/2019 at 4:45 pm to provider [facility] , who verbally acknowledged these results. Electronically Signed   By: San Morelle M.D.   On: 12/16/2019 16:46   CT CEREBRAL PERFUSION W CONTRAST  Result Date: 12/16/2019 CLINICAL DATA:  Stroke. Progressive acute mental status changes during dialysis. EXAM: CT ANGIOGRAPHY HEAD AND NECK CT PERFUSION BRAIN TECHNIQUE: Multidetector CT imaging of the head and neck was performed using the standard protocol during bolus administration of intravenous contrast. Multiplanar CT image reconstructions and MIPs were obtained to evaluate the vascular anatomy. Carotid stenosis measurements (when applicable) are obtained utilizing NASCET criteria, using the distal internal carotid diameter as the denominator. Multiphase CT imaging of the brain was performed following IV bolus contrast injection. Subsequent parametric perfusion maps were calculated using RAPID software. CONTRAST:  140mL OMNIPAQUE IOHEXOL 350 MG/ML SOLN COMPARISON:  MR head without contrast 06/26/2016 FINDINGS: CT HEAD FINDINGS Brain: No acute infarct, hemorrhage, or mass lesion is present. The ventricles are of normal size. No significant white matter lesions are present. The brainstem and cerebellum are within normal limits. Vascular: Atherosclerotic calcifications are present within the cavernous internal carotid arteries bilaterally. There is no hyperdense vessel. Skull: Calvarium is intact. No focal lytic or blastic  lesions are present. No significant extracranial soft tissue lesion is present. Sinuses/Orbits: The paranasal sinuses and mastoid air cells are clear. The globes and  orbits are within normal limits. ASPECTS ([place] Stroke Program Early CT Score) - Ganglionic level infarction (caudate, lentiform nuclei, internal capsule, insula, M1-M3 cortex): 7/7 - Supraganglionic infarction (M4-M6 cortex): 3/3 Total score (0-10 with 10 being normal): 10/10 Review of the MIP images confirms the above findings CTA NECK FINDINGS Aortic arch: A 3 vessel arch configuration is present. There is no significant atherosclerotic disease or stenosis. There is no aneurysm. Right carotid system: The proximal right common carotid artery is obscured by motion artifact. There is no significant stenosis in the right common carotid artery. Right MCA bifurcation is within normal limits. Cervical right ICA is normal. Left carotid system: The left common carotid artery is somewhat distorted by motion. No focal stenosis is present. Atherosclerotic calcifications are present at the bifurcation without significant stenosis. Cervical left ICA is otherwise normal. Vertebral arteries: The right vertebral artery is the dominant vessel. There is some distortion at the C4-5 level. No other significant stenosis is present. Vertebral artery origins are within normal limits bilaterally. Skeleton: Is straightening and reversal of some the normal cervical lordosis. 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 Grade 1 retrolisthesis is likely exaggerated by artifact at C4-5. Focal lytic or blastic lesions are present. Other neck: The soft tissues the neck are otherwise unremarkable. Upper chest: Posterior airspace opacities are present on the right. The left lung is clear. Thoracic inlet is normal. Review of the MIP images confirms the above findings CTA HEAD FINDINGS Anterior circulation: Atherosclerotic calcifications are present within the cavernous internal carotid arteries bilaterally without a significant stenosis relative to the more distal vessels. ICA termini are normal bilaterally. The left  A1 segment is dominant. The right A1 segment is hypoplastic or occluded. The anterior communicating artery is patent and both A2 segments fill. M1 segments are normal bilaterally. The MCA bifurcations are intact. There is some irregularity of distal ACA and MCA branch vessels without a significant proximal stenosis or occlusion. No aneurysm is present. Posterior circulation: The right vertebral artery is the dominant vessel. The left vertebral artery terminates at the PICA. The right AICA vessel is dominant. The basilar artery is normal. Both posterior cerebral arteries originate from the basilar tip. There is mild irregularity of the left P2 segment with a 50% stenosis. Mild irregularity is present in the distal PCA branch vessels without other significant proximal stenosis or occlusion. Venous sinuses: The dural sinuses are patent. The straight sinus and deep cerebral veins are intact. Cortical veins are within normal limits. Anatomic variants: None Review of the MIP images confirms the above findings CT Brain Perfusion Findings: ASPECTS: 10/10 CBF (<30%) Volume: 15mL Perfusion (Tmax>6.0s) volume: 43mL Mismatch Volume: 13mL Infarction Location:n/a IMPRESSION: 1. CT perfusion demonstrates no acute infarct or significant ischemia. 2. No emergent large vessel occlusion. 3. Absence of the right A1 segment is likely due to a hypoplastic or chronically occluded vessel. Anterior communicating artery and both A2 segments are normal. 4. Atherosclerotic changes are noted at the left carotid bifurcation and cavernous internal carotid arteries bilaterally without significant stenosis. 5. Hypoplastic left vertebral artery terminates at the PICA. 6. Mild narrowing of the proximal left PCA. 7. Mild diffuse distal small vessel disease without significant proximal stenosis or occlusion elsewhere within the circle[place]of[place]. These results were called by telephone at the time of interpretation on 12/16/2019 at 4:45 pm to  provider  Dalia Heading , who verbally acknowledged these results. Electronically Signed   By: San Morelle M.D.   On: 12/16/2019 16:46   Medications: . remdesivir 100 mg in NS 100 mL Stopped (12/17/19 2304)   . allopurinol  200 mg Oral Daily  . aspirin EC  81 mg Oral Daily  . calcitRIOL  0.25 mcg Oral Q T,Th,Sa-HD  . calcium acetate  667 mg Oral TID WC  . carvedilol  25 mg Oral BID WC  . Chlorhexidine Gluconate Cloth  6 each Topical Q0600  . Darbepoetin Alfa      . [START ON 12/25/2019] darbepoetin (ARANESP) injection - DIALYSIS  40 mcg Intravenous Q Thu-HD  . dexamethasone (DECADRON) injection  6 mg Intravenous Daily  . escitalopram  5 mg Oral Daily  . famotidine  20 mg Oral Daily  . feeding supplement (NEPRO CARB STEADY)  237 mL Oral TID WC  . heparin  5,000 Units Subcutaneous Q8H  . insulin aspart  0-9 Units Subcutaneous TID WC  . insulin glargine  10 Units Subcutaneous Daily  . loratadine  10 mg Oral Daily  . losartan  25 mg Oral Daily  . multivitamin  1 tablet Oral QPM  . pantoprazole  40 mg Oral Daily  . [START ON 12/19/2019] pneumococcal 23 valent vaccine  0.5 mL Intramuscular Tomorrow-1000  . rosuvastatin  10 mg Oral Q M,W,F

## 2019-12-18 NOTE — Progress Notes (Signed)
Pt arrived from ED. Pt lives at home with children. Pt has flexiseal in place. Pt is A&Ox4. Pt's skin is dry and pt states she has eczema to groin and buttocks (no open areas noted just pink in color). Oriented pt to room and call bell. Told pt to call for assistance before getting out of bed. Pt stated understanding. Pt placed on telemetry. Pt has AV fistula to left upper arm. Will continue to monitor pt. Ranelle Oyster. RN

## 2019-12-18 NOTE — Progress Notes (Signed)
PROGRESS NOTE    Katie Garcia  [id number] DOB: 07-04-1941 DOA: 12/16/2019 PCP: [name], MD    Brief Narrative:  78 year old female who presented with altered mental status.  She does have significant past medical history for end-stage renal disease, hypertension and type 2 diabetes mellitus.  She was brought to the hospital due to progressive altered mentation during dialysis.  On her initial physical examination her temperature was 102 F, blood pressure 144/82, pulse rate 84, oxygen saturation 92 to 96%, her lungs are clear to auscultation bilaterally, heart S1-S2 present and rhythmic, abdomen soft, no lower extremity edema. Sodium 137, potassium 3.2, chloride 94, bicarb 29, glucose 132, BUN 18, creatinine 4.9, white count 3.9, hemoglobin 11.1, hematocrit 34.6, platelets 169.  SARS COVID-19 positive.  Chest radiograph with bilateral interstitial infiltrates, predominantly left base, personally reviewed.  CT angio head and neck no acute changes.  EKG 85 bpm, normal axis, normal intervals, normal sinus, no ST segment or T wave changes.  Patient was admitted to the hospital with a working diagnosis of acute metabolic encephalopathy in the setting of SARS COVID-19 infection.  Patient with elevated inflammatory markers and significant risk factors for worsening COVID 19 viral pneumonia, will add systemic steroids  Patient has been responding well to medical therapy with Remdesivir and systemic steroids.   Assessment & Plan:   Principal Problem:   COVID-19 virus infection Active Problems:   DM (diabetes mellitus), secondary uncontrolled ([place])   ESRD (end stage renal disease) ([place])   Acute metabolic encephalopathy   1. Acute viral pneumonia due to SARS COVID 19.  RR:19  Pulse oxymetry: 97%  Fi02: 21% room air.  COVID-19 Labs  Recent Labs    12/17/19 0130 12/17/19 0411 12/18/19 0659  DDIMER 1.55* 1.60* 1.51*  CRP 5.2* 5.6* 7.6*    Lab Results  Component  Value Date   SARSCOV2NAA POSITIVE (A) 12/16/2019     Inflammatory markers continue to be stable. Sherald Hess well medical therapy with Remdesivir # 2/5 (AST 50 and ALT 24)  Continue steroids with dexamethasone 6 mg Iv q 24H.  On antitussive agents and airway clearing techniques with flutter valve and incentive spirometer.   2. Metabolic encephalopathy with delirium. Now resolved. Will request patient to be out of bed tid with meals and have PT and OT evaluations.  Continue with escitalopram.   3. ESRD on HD. Patient continue to be clinically euvolemic, K at 3.9 with serum bicarbonate at 22. Schedule TTS, next session will be 12/27. Will continue to hold on furosemide for now.   4. HTN. On carvedilol and losartan for blood pressure control.   5. Uncontrolled T2DM (Hgb A1c at 8,6)/ dysplidemia. Fasting glucose this am is  223, patient had 6 units of sliding scale yesterday. At home uses 26 in am and 28 units pm of 70/30 insulin, about 36 units basal.   Continue with insulin sliding scale and increase basal insulin to 20 units glargine. On rosuvastatin.    DVT prophylaxis: enoxaparin   Code Status:  full Family Communication: no family at the bedside  Disposition Plan/ discharge barriers: pending clinical improvement    Nutrition Status:           Skin Documentation:     Consultants:   Nephrology   Procedures:     Antimicrobials:       Subjective: Patient is feeling better, dyspnea has been improving, not yet back to baseline, asking to get out of the bed. No nausea  or vomiting.   Objective: Vitals:   12/18/19 0145 12/18/19 0150 12/18/19 0305 12/18/19 0522  BP: 126/75     Pulse: 76 76    Resp: (!) 26 19    Temp:    98.6 F (37 C)  TempSrc:    Oral  SpO2: 96% 97%    Weight:   85.4 kg   Height:   5' (1.524 m)     Intake/Output Summary (Last 24 hours) at 12/18/2019 0757 Last data filed at 12/17/2019 [id number] Gross per 24 hour  Intake 340 ml    Output --  Net 340 ml   Filed Weights   12/18/19 0305  Weight: 85.4 kg    Examination:   General: Not in pain or dyspnea, deconditioned  Neurology: Awake and alert, non focal  E ENT: mild pallor, no icterus, oral mucosa moist Cardiovascular: No JVD. S1-S2 present, rhythmic. No lower extremity edema. Pulmonary: positive  breath sounds bilaterally. Gastrointestinal. Abdomen with, no organomegaly, non tender, no rebound or guarding Skin. No rashes Musculoskeletal: no joint deformities     Data Reviewed: I have personally reviewed following labs and imaging studies  CBC: Recent Labs  Lab 12/16/19 1125 12/17/19 0411 12/18/19 0659  WBC 3.9* 2.8* 3.6*  NEUTROABS 2.7 2.0 2.8  HGB 11.1* 10.0* 10.0*  HCT 34.6* 31.2* 30.4*  MCV 91.5 91.5 89.1  PLT 169 144* [id number]*   Basic Metabolic Panel: Recent Labs  Lab 12/16/19 1125 12/17/19 0411 12/18/19 0659  NA 137 136 132*  K 3.2* 3.7 3.9  CL 94* 95* 91*  CO2 29 26 22   GLUCOSE 132* 118* 223*  BUN 18 31* 60*  CREATININE 4.95* 7.47* 10.31*  CALCIUM 8.3* 7.8* 7.8*   GFR: Estimated Creatinine Clearance: 4.5 mL/min (A) (by C-G formula based on SCr of 10.31 mg/dL (H)). Liver Function Tests: Recent Labs  Lab 12/17/19 0411 12/18/19 0659  AST 38 50*  ALT 21 24  ALKPHOS 47 50  BILITOT 0.6 0.2*  PROT 6.1* 6.2*  ALBUMIN 2.9* 2.9*   No results for input(s): LIPASE, AMYLASE in the last 168 hours. No results for input(s): AMMONIA in the last 168 hours. Coagulation Profile: No results for input(s): INR, PROTIME in the last 168 hours. Cardiac Enzymes: No results for input(s): CKTOTAL, CKMB, CKMBINDEX, TROPONINI in the last 168 hours. BNP (last 3 results) No results for input(s): PROBNP in the last 8760 hours. HbA1C: Recent Labs    12/17/19 0130  HGBA1C 8.6*   CBG: Recent Labs  Lab 12/17/19 0731 12/17/19 1213 12/17/19 1640 12/17/19 2240 12/18/19 0754  GLUCAP 123* 158* 227* 301* 215*   Lipid Profile: No results for  input(s): CHOL, HDL, LDLCALC, TRIG, CHOLHDL, LDLDIRECT in the last 72 hours. Thyroid Function Tests: No results for input(s): TSH, T4TOTAL, FREET4, T3FREE, THYROIDAB in the last 72 hours. Anemia Panel: No results for input(s): VITAMINB12, FOLATE, FERRITIN, TIBC, IRON, RETICCTPCT in the last 72 hours.    Radiology Studies: I have reviewed all of the imaging during this hospital visit personally     Scheduled Meds: . allopurinol  200 mg Oral Daily  . aspirin EC  81 mg Oral Daily  . calcitRIOL  0.25 mcg Oral Q T,Th,Sa-HD  . calcium acetate  667 mg Oral TID WC  . carvedilol  25 mg Oral BID WC  . Chlorhexidine Gluconate Cloth  6 each Topical Q0600  . [START ON 12/25/2019] darbepoetin (ARANESP) injection - DIALYSIS  40 mcg Intravenous Q Thu-HD  . dexamethasone (DECADRON) injection  6 mg Intravenous Daily  . escitalopram  5 mg Oral Daily  . famotidine  20 mg Oral Daily  . feeding supplement (NEPRO CARB STEADY)  237 mL Oral TID WC  . heparin  5,000 Units Subcutaneous Q8H  . insulin aspart  0-9 Units Subcutaneous TID WC  . insulin glargine  10 Units Subcutaneous Daily  . loratadine  10 mg Oral Daily  . losartan  25 mg Oral Daily  . multivitamin  1 tablet Oral QPM  . pantoprazole  40 mg Oral Daily  . [START ON 12/19/2019] pneumococcal 23 valent vaccine  0.5 mL Intramuscular Tomorrow-1000  . rosuvastatin  10 mg Oral Q M,W,F   Continuous Infusions: . remdesivir 100 mg in NS 100 mL Stopped (12/17/19 2304)     LOS: 2 days        Adamarys Shall Gerome Apley, MD

## 2019-12-19 LAB — COMPREHENSIVE METABOLIC PANEL
ALT: 29 U/L (ref 0–44)
AST: 52 U/L — ABNORMAL HIGH (ref 15–41)
Albumin: 2.8 g/dL — ABNORMAL LOW (ref 3.5–5.0)
Alkaline Phosphatase: 48 U/L (ref 38–126)
Anion gap: 17 — ABNORMAL HIGH (ref 5–15)
BUN: 32 mg/dL — ABNORMAL HIGH (ref 8–23)
CO2: 24 mmol/L (ref 22–32)
Calcium: 7.8 mg/dL — ABNORMAL LOW (ref 8.9–10.3)
Chloride: 95 mmol/L — ABNORMAL LOW (ref 98–111)
Creatinine, Ser: 6.26 mg/dL — ABNORMAL HIGH (ref 0.44–1.00)
GFR calc Af Amer: 7 mL/min — ABNORMAL LOW (ref >60)
GFR calc non Af Amer: 6 mL/min — ABNORMAL LOW (ref >60)
Glucose, Bld: 285 mg/dL — ABNORMAL HIGH (ref 70–99)
Potassium: 4.3 mmol/L (ref 3.5–5.1)
Sodium: 136 mmol/L (ref 135–145)
Total Bilirubin: 0.2 mg/dL — ABNORMAL LOW (ref 0.3–1.2)
Total Protein: 5.6 g/dL — ABNORMAL LOW (ref 6.5–8.1)

## 2019-12-19 LAB — FERRITIN: Ferritin: 713 ng/mL — ABNORMAL HIGH (ref 11–307)

## 2019-12-19 LAB — GLUCOSE, CAPILLARY
Glucose-Capillary: 244 mg/dL — ABNORMAL HIGH (ref 70–99)
Glucose-Capillary: 388 mg/dL — ABNORMAL HIGH (ref 70–99)
Glucose-Capillary: 328 mg/dL — ABNORMAL HIGH (ref 70–99)
Glucose-Capillary: 195 mg/dL — ABNORMAL HIGH (ref 70–99)

## 2019-12-19 LAB — D-DIMER, QUANTITATIVE: D-Dimer, Quant: 1.34 ug/mL-FEU — ABNORMAL HIGH (ref 0.00–0.50)

## 2019-12-19 LAB — C-REACTIVE PROTEIN: CRP: 6.4 mg/dL — ABNORMAL HIGH (ref <1.0)

## 2019-12-19 MED ORDER — INSULIN GLARGINE 100 UNIT/ML ~~LOC~~ SOLN
10.0000 [IU] | Freq: Once | SUBCUTANEOUS | Status: AC
  Administered 2019-12-19: 10 [IU] via SUBCUTANEOUS
  Filled 2019-12-19: qty 0.1

## 2019-12-19 MED ORDER — PHENOL 1.4 % MT LIQD: 1.0000 | Freq: Four times a day (QID) | OROMUCOSAL | Status: DC | PRN

## 2019-12-19 MED ORDER — INSULIN ASPART 100 UNIT/ML ~~LOC~~ SOLN
3.0000 [IU] | Freq: Three times a day (TID) | SUBCUTANEOUS | Status: DC
  Administered 2019-12-19 – 2019-12-20 (×3): 3 [IU] via SUBCUTANEOUS

## 2019-12-19 NOTE — Plan of Care (Signed)

## 2019-12-19 NOTE — Progress Notes (Signed)
Patient ID: Katie Garcia, female   DOB: 07-18-1941, 78 y.o.   MRN: [id number]  [facility] Progress Note   Assessment/ Plan:   1.  COVID-19 pneumonia with associated encephalopathy: On intravenous remdesivir and Decadron with significant clinical improvement-1 more dose of remdesivir left tomorrow following which she can potentially be discharged back to her outpatient dialysis unit facility. 2. ESRD: Underwent hemodialysis yesterday on her usual outpatient TTS schedule with next hemodialysis due on Sunday to accommodate for holiday. 3. Anemia: Stable hemoglobin and hematocrit without overt blood loss, continue to follow on ESA. 4. CKD-MBD: On calcium acetate for phosphorus binding, continue calcitriol for PTH suppression. 5. Nutrition: Low albumin possibly exacerbated by recent acute infection with COVID-19, continue renal diet with ONS. 6. Hypertension: Blood pressure currently at goal, monitor with HD/UF and ongoing antihypertensive therapy.  Subjective:   Reports to be feeling much better, she does not quite recall events leading up to her hospitalization but feels well today.   Objective:   BP (!) 125/53 (BP Location: Right Arm)   Pulse 70   Temp 98.9 F (37.2 C) (Oral)   Resp 18   Ht 5' (1.524 m)   Wt 88 kg   SpO2 97%   BMI 37.89 kg/m   Physical Exam: Gen: Sitting up comfortably on the side of her bed in a chair.  She is well oriented to time, place, person and situation. CVS: Pulse regular rhythm, normal rate, S1 and S2 normal Resp: Clear to auscultation, no rales/rhonchi. Abd: Soft, flat, nontender Ext: No lower extremity edema, left upper arm AV fistula with palpable thrill  Labs: BMET Recent Labs  Lab 12/16/19 1125 12/17/19 0411 12/18/19 0659 12/19/19 0311  NA 137 136 132* 136  K 3.2* 3.7 3.9 4.3  CL 94* 95* 91* 95*  CO2 29 26 22 24   GLUCOSE 132* 118* 223* 285*  BUN 18 31* 60* 32*  CREATININE 4.95* 7.47* 10.31* 6.26*  CALCIUM 8.3* 7.8* 7.8*  7.8*   CBC Recent Labs  Lab 12/16/19 1125 12/17/19 0411 12/18/19 0659  WBC 3.9* 2.8* 3.6*  NEUTROABS 2.7 2.0 2.8  HGB 11.1* 10.0* 10.0*  HCT 34.6* 31.2* 30.4*  MCV 91.5 91.5 89.1  PLT 169 144* 146*      Medications:    . allopurinol  200 mg Oral Daily  . aspirin EC  81 mg Oral Daily  . calcitRIOL  0.25 mcg Oral Q T,Th,Sa-HD  . calcium acetate  667 mg Oral TID WC  . carvedilol  25 mg Oral BID WC  . Chlorhexidine Gluconate Cloth  6 each Topical Q0600  . [START ON 12/25/2019] darbepoetin (ARANESP) injection - DIALYSIS  40 mcg Intravenous Q Thu-HD  . dexamethasone (DECADRON) injection  6 mg Intravenous Daily  . escitalopram  5 mg Oral Daily  . famotidine  20 mg Oral Daily  . feeding supplement (NEPRO CARB STEADY)  237 mL Oral TID WC  . heparin  5,000 Units Subcutaneous Q8H  . insulin aspart  0-9 Units Subcutaneous TID WC  . insulin glargine  20 Units Subcutaneous Daily  . loratadine  10 mg Oral Daily  . losartan  25 mg Oral Daily  . multivitamin  1 tablet Oral QPM  . pantoprazole  40 mg Oral Daily  . pneumococcal 23 valent vaccine  0.5 mL Intramuscular Tomorrow-1000  . rosuvastatin  10 mg Oral Q M,W,F   Elmarie Shiley, MD 12/19/2019, 11:36 AM

## 2019-12-19 NOTE — Progress Notes (Signed)
PROGRESS NOTE    Katie Garcia  [id number] DOB: January 13, 1941 DOA: 12/16/2019 PCP: [name], MD    Brief Narrative:  78 year old female who presented with altered mental status. She does have significant past medical history for end-stage renal disease, hypertension and type 2 diabetes mellitus. She was brought to the hospital due to progressive altered mentation during dialysis. On her initial physical examination her temperature was 102 F, blood pressure 144/82, pulse rate 84, oxygen saturation 92 to96%, her lungs are clear to auscultation bilaterally, heart S1-S2 present and rhythmic, abdomen soft, no lower extremity edema. Sodium 137, potassium 3.2, chloride 94, bicarb 29, glucose 132, BUN 18, creatinine 4.9, white count 3.9, hemoglobin 11.1, hematocrit 34.6, platelets 169.SARS COVID-19 positive. Chest radiograph with bilateral interstitial infiltrates, predominantly left base, personally reviewed. CT angio head and neck no acute changes. EKG 85 bpm, normal axis, normal intervals, normal sinus, no ST segment or T wave changes.  Patient was admitted to the hospital with a working diagnosis of acute metabolic encephalopathy in the setting of SARS COVID-19 infection.  Patient with elevated inflammatory markers and significant risk factors for worsening COVID 19 viral pneumonia, will add systemic steroids  Patient has been responding well to medical therapy with Remdesivir and systemic steroids.     Assessment & Plan:   Principal Problem:   COVID-19 virus infection Active Problems:   DM (diabetes mellitus), secondary uncontrolled ([place])   ESRD (end stage renal disease) ([place])   Acute metabolic encephalopathy   1. Acute viral pneumonia due to SARS COVID 19.  RR: 18  Pulse oxymetry: 97%  Fi02: 21% room air.   COVID-19 Labs  Recent Labs    12/17/19 0411 12/18/19 0659 12/19/19 0311  DDIMER 1.60* 1.51* 1.34*  FERRITIN  --   --  713*  CRP 5.6* 7.6*  6.4*    Lab Results  Component Value Date   SARSCOV2NAA POSITIVE (A) 12/16/2019     Continue to improve inflammatory markers.   Continue medical therapy with Remdesivir # 5/5 (AST 52 and ALT 29)  Continue steroids with dexamethasone 6 mg Iv q 24H.  Continue antitussive agents and airway clearing techniques with flutter valve and incentive spirometer.   2. Metabolic encephalopathy with delirium. Now resolved.  Continue with escitalopram. Pending physical and occupational therapy evaluation. Patient has been out of bed to chair.   3. ESRD on HD. Continue euvolemic, K at 4,3 with serum bicarbonate at 22. Here schedule is TTS. Plan for HD on 12/27. Holding furosemide for now. On Aranesp, Phoslo and calcitriol.   4. HTN. Continue with carvedilol and losartan with good tolerationl.   5. Uncontrolled T2DM (Hgb A1c at 8,6) and steroid induced hyperglycemia/ dysplidemia. Fasting glucose this am is  285, will increase basal insulin to 30 units and pre meal coverage with 3 units. Continue with insulin sliding scale. Continue with rosuvastatin.    DVT prophylaxis:enoxaparin Code Status:full Family Communication:no family at the bedside Disposition Plan/ discharge barriers:pending clinical improvement   Subjective: Patient is feeling better, has been our of bed to chair, dyspnea is improving, no nausea or vomiting, not back to baseline yet. Will have HD tomorrow.   Objective: Vitals:   12/18/19 2117 12/19/19 0020 12/19/19 0308 12/19/19 0545  BP: 133/84   (!) 125/53  Pulse: 79   70  Resp: 18   18  Temp: (!) 101.6 F (38.7 C) 98.7 F (37.1 C)  98.9 F (37.2 C)  TempSrc: Oral Oral  Oral  SpO2: 96%  97%  Weight:   88 kg   Height:        Intake/Output Summary (Last 24 hours) at 12/19/2019 0920 Last data filed at 12/18/2019 1430 Gross per 24 hour  Intake 240 ml  Output 2500 ml  Net -2260 ml   Filed Weights   12/18/19 1050 12/18/19 1430 12/19/19 0308  Weight: 89  kg 86.5 kg 88 kg    Examination:   General: Not in pain or dyspnea, deconditioned  Neurology: Awake and alert, non focal  E ENT: mild pallor, no icterus, oral mucosa moist Cardiovascular: No JVD. S1-S2 present, rhythmic, no gallops, rubs, or murmurs. No lower extremity edema. Pulmonary: positive breath sounds bilaterally. Gastrointestinal. Abdomen with no organomegaly, non tender, no rebound or guarding Skin. No rashes Musculoskeletal: no joint deformities     Data Reviewed: I have personally reviewed following labs and imaging studies  CBC: Recent Labs  Lab 12/16/19 1125 12/17/19 0411 12/18/19 0659  WBC 3.9* 2.8* 3.6*  NEUTROABS 2.7 2.0 2.8  HGB 11.1* 10.0* 10.0*  HCT 34.6* 31.2* 30.4*  MCV 91.5 91.5 89.1  PLT 169 144* [id number]*   Basic Metabolic Panel: Recent Labs  Lab 12/16/19 1125 12/17/19 0411 12/18/19 0659 12/19/19 0311  NA 137 136 132* 136  K 3.2* 3.7 3.9 4.3  CL 94* 95* 91* 95*  CO2 29 26 22 24   GLUCOSE 132* 118* 223* 285*  BUN 18 31* 60* 32*  CREATININE 4.95* 7.47* 10.31* 6.26*  CALCIUM 8.3* 7.8* 7.8* 7.8*   GFR: Estimated Creatinine Clearance: 7.5 mL/min (A) (by C-G formula based on SCr of 6.26 mg/dL (H)). Liver Function Tests: Recent Labs  Lab 12/17/19 0411 12/18/19 0659 12/19/19 0311  AST 38 50* 52*  ALT 21 24 29   ALKPHOS 47 50 48  BILITOT 0.6 0.2* 0.2*  PROT 6.1* 6.2* 5.6*  ALBUMIN 2.9* 2.9* 2.8*   No results for input(s): LIPASE, AMYLASE in the last 168 hours. No results for input(s): AMMONIA in the last 168 hours. Coagulation Profile: No results for input(s): INR, PROTIME in the last 168 hours. Cardiac Enzymes: No results for input(s): CKTOTAL, CKMB, CKMBINDEX, TROPONINI in the last 168 hours. BNP (last 3 results) No results for input(s): PROBNP in the last 8760 hours. HbA1C: Recent Labs    12/17/19 0130  HGBA1C 8.6*   CBG: Recent Labs  Lab 12/18/19 0754 12/18/19 1511 12/18/19 1659 12/18/19 2119 12/19/19 0753  GLUCAP 215*  137* 213* 244* 244*   Lipid Profile: No results for input(s): CHOL, HDL, LDLCALC, TRIG, CHOLHDL, LDLDIRECT in the last 72 hours. Thyroid Function Tests: No results for input(s): TSH, T4TOTAL, FREET4, T3FREE, THYROIDAB in the last 72 hours. Anemia Panel: Recent Labs    12/19/19 [id number]*      Radiology Studies: I have reviewed all of the imaging during this hospital visit personally     Scheduled Meds: . allopurinol  200 mg Oral Daily  . aspirin EC  81 mg Oral Daily  . calcitRIOL  0.25 mcg Oral Q T,Th,Sa-HD  . calcium acetate  667 mg Oral TID WC  . carvedilol  25 mg Oral BID WC  . Chlorhexidine Gluconate Cloth  6 each Topical Q0600  . [START ON 12/25/2019] darbepoetin (ARANESP) injection - DIALYSIS  40 mcg Intravenous Q Thu-HD  . dexamethasone (DECADRON) injection  6 mg Intravenous Daily  . escitalopram  5 mg Oral Daily  . famotidine  20 mg Oral Daily  . feeding supplement (NEPRO CARB STEADY)  237 mL  Oral TID WC  . heparin  5,000 Units Subcutaneous Q8H  . insulin aspart  0-9 Units Subcutaneous TID WC  . insulin glargine  20 Units Subcutaneous Daily  . loratadine  10 mg Oral Daily  . losartan  25 mg Oral Daily  . multivitamin  1 tablet Oral QPM  . pantoprazole  40 mg Oral Daily  . pneumococcal 23 valent vaccine  0.5 mL Intramuscular Tomorrow-1000  . rosuvastatin  10 mg Oral Q M,W,F   Continuous Infusions:   LOS: 3 days        Lashika Erker Gerome Apley, MD

## 2019-12-20 DIAGNOSIS — J1282 Pneumonia due to coronavirus disease 2019: Secondary | ICD-10-CM

## 2019-12-20 DIAGNOSIS — J1289 Other viral pneumonia: Secondary | ICD-10-CM

## 2019-12-20 DIAGNOSIS — U071 COVID-19: Secondary | ICD-10-CM

## 2019-12-20 LAB — COMPREHENSIVE METABOLIC PANEL
ALT: 27 U/L (ref 0–44)
AST: 40 U/L (ref 15–41)
Albumin: 2.7 g/dL — ABNORMAL LOW (ref 3.5–5.0)
Alkaline Phosphatase: 57 U/L (ref 38–126)
Anion gap: 13 (ref 5–15)
BUN: 66 mg/dL — ABNORMAL HIGH (ref 8–23)
CO2: 26 mmol/L (ref 22–32)
Calcium: 7.9 mg/dL — ABNORMAL LOW (ref 8.9–10.3)
Chloride: 96 mmol/L — ABNORMAL LOW (ref 98–111)
Creatinine, Ser: 8.61 mg/dL — ABNORMAL HIGH (ref 0.44–1.00)
GFR calc Af Amer: 5 mL/min — ABNORMAL LOW (ref >60)
GFR calc non Af Amer: 4 mL/min — ABNORMAL LOW (ref >60)
Glucose, Bld: 215 mg/dL — ABNORMAL HIGH (ref 70–99)
Potassium: 4.4 mmol/L (ref 3.5–5.1)
Sodium: 135 mmol/L (ref 135–145)
Total Bilirubin: 0.5 mg/dL (ref 0.3–1.2)
Total Protein: 6 g/dL — ABNORMAL LOW (ref 6.5–8.1)

## 2019-12-20 LAB — GLUCOSE, CAPILLARY
Glucose-Capillary: 196 mg/dL — ABNORMAL HIGH (ref 70–99)
Glucose-Capillary: 248 mg/dL — ABNORMAL HIGH (ref 70–99)

## 2019-12-20 LAB — C-REACTIVE PROTEIN: CRP: 4.9 mg/dL — ABNORMAL HIGH (ref <1.0)

## 2019-12-20 LAB — D-DIMER, QUANTITATIVE: D-Dimer, Quant: 0.96 ug/mL-FEU — ABNORMAL HIGH (ref 0.00–0.50)

## 2019-12-20 LAB — FERRITIN: Ferritin: 2476 ng/mL — ABNORMAL HIGH (ref 11–307)

## 2019-12-20 MED ORDER — GUAIFENESIN-DM 100-10 MG/5ML PO SYRP: 5.0000 mL | ORAL_SOLUTION | Freq: Four times a day (QID) | ORAL | 0 refills | Status: DC | PRN

## 2019-12-20 MED ORDER — SODIUM CHLORIDE 0.9 % IV SOLN
100.0000 mg | Freq: Every day | INTRAVENOUS | Status: AC
  Administered 2019-12-20: 100 mg via INTRAVENOUS
  Filled 2019-12-20: qty 20

## 2019-12-20 NOTE — Evaluation (Signed)
Physical Therapy Evaluation & Discharge Patient Details Name: Katie Garcia MRN: [id number] DOB: 29-Aug-1941 Today's Date: 12/20/2019   History of Present Illness  Pt is a 78 y.o. female admitted 12/16/19 with AMS during dialysis. (+) SARS COVID-19. Head CT without acute changes. PMH includes ESRD, HTN, DM2.    Clinical Impression  Patient evaluated by Physical Therapy with no further acute PT needs identified. PTA, pt independent, drives and lives with children. Today, pt independent with mobility and ADL tasks. SpO2 97% on RA. All education has been completed and the patient has no further questions. Encouraged frequent OOB mobility during hospital admission, including hallway ambulation with mask. Acute PT is signing off. Thank you for this referral.    Follow Up Recommendations No PT follow up;Supervision - Intermittent    Equipment Recommendations  None recommended by PT    Recommendations for Other Services       Precautions / Restrictions Restrictions Weight Bearing Restrictions: No      Mobility  Bed Mobility               General bed mobility comments: Received standing in bathroom preparing to do washup  Transfers Overall transfer level: Independent Equipment used: None                Ambulation/Gait Ambulation/Gait assistance: Independent Gait Distance (Feet): 350 Feet Assistive device: None Gait Pattern/deviations: Step-through pattern;Decreased stride length;Wide base of support   Gait velocity interpretation: 1.31 - 2.62 ft/sec, indicative of limited community Conservation officer, historic buildings [place] (Stroke Patients Only)       Balance Overall balance assessment: No apparent balance deficits (not formally assessed)                                           Pertinent Vitals/Pain Pain Assessment: No/denies pain    Home Living Family/patient expects to be discharged to:: Private  residence Living Arrangements: Children Available Help at Discharge: Family;Available 24 hours/day Type of Home: House Home Access: Stairs to enter Entrance Stairs-Rails: Right Entrance Stairs-Number of Steps: 5 Home Layout: One level Home Equipment: Walker - 2 wheels;Bedside commode Additional Comments: Uses BSC as seat in tub    Prior Function Level of Independence: Independent         Comments: Independent without DME. Drives. Reports no hobbies since HD takes up most of her time. Enjoys time with grandchildren     Hand Dominance        Extremity/Trunk Assessment   Upper Extremity Assessment Upper Extremity Assessment: Overall WFL for tasks assessed    Lower Extremity Assessment Lower Extremity Assessment: Overall WFL for tasks assessed    Cervical / Trunk Assessment Cervical / Trunk Assessment: Normal  Communication   Communication: No difficulties  Cognition Arousal/Alertness: Awake/alert Behavior During Therapy: WFL for tasks assessed/performed Overall Cognitive Status: Within Functional Limits for tasks assessed                                 General Comments: Increased time to answer date/year, ultimately A&Ox4. WFL for simple tasks, not formally assessed      General Comments General comments (skin integrity, edema, etc.): SpO2 97% on RA. Educ on activity recommendations while admitted and for  return home    Exercises     Assessment/Plan    PT Assessment Patent does not need any further PT services  PT Problem List         PT Treatment Interventions      PT Goals (Current goals can be found in the Care Plan section)  Acute Rehab PT Goals PT Goal Formulation: All assessment and education complete, DC therapy    Frequency     Barriers to discharge        Co-evaluation               AM-PAC PT "6 Clicks" Mobility  Outcome Measure Help needed turning from your back to your side while in a flat bed without using  bedrails?: None Help needed moving from lying on your back to sitting on the side of a flat bed without using bedrails?: None Help needed moving to and from a bed to a chair (including a wheelchair)?: None Help needed standing up from a chair using your arms (e.g., wheelchair or bedside chair)?: None Help needed to walk in hospital room?: None Help needed climbing 3-5 steps with a railing? : A Little 6 Click Score: 23    End of Session   Activity Tolerance: Patient tolerated treatment well Patient left: in chair;with call bell/phone within reach;with nursing/sitter in room Nurse Communication: Mobility status PT Visit Diagnosis: Other abnormalities of gait and mobility (R26.89)    Time: [id number] PT Time Calculation (min) (ACUTE ONLY): 15 min   Charges:   PT Evaluation $PT Eval Moderate Complexity: [place], PT, DPT Acute Rehabilitation Services  Pager [phone] Office [place] 12/20/2019, 9:07 AM

## 2019-12-20 NOTE — Progress Notes (Signed)
Katie Garcia to be D/C'd home per MD order. Discussed with the patient and all questions fully answered. VVS, Skin clean, dry and intact without evidence of skin break down, no evidence of skin tears noted.  IV catheter discontinued intact. Site without signs and symptoms of complications. Dressing and pressure applied.  An After Visit Summary was printed and given to the patient.  Patient escorted via [place], and D/C home via private auto.  Melonie [place]

## 2019-12-20 NOTE — Discharge Summary (Signed)
Physician Discharge Summary  Katie [name] DOB: 12-12-1941 DOA: 12/16/2019  PCP: [name], MD  Admit date: 12/16/2019 Discharge date: 12/20/2019  Admitted From: Home  Disposition:  Home   Recommendations for Outpatient Follow-up and new medication changes:  1. Follow up with Dr. Fara Olden in 2 weeks. 2. Continue quarantine for 2 weeks, use a mask in public and maintain physical distancing.   Home Health: no   Equipment/Devices: no   Discharge Condition: stable  CODE STATUS: full  Diet recommendation: heart health and diabetic/ renal prudent.   Brief/Interim Summary: 78 year old female who presented with altered mental status. She does have significant past medical history for end-stage renal disease, hypertension and type 2 diabetes mellitus. She was brought to the hospital due to progressive altered mentation during dialysis. On her initial physical examination her temperature was 102 F, blood pressure 144/82, pulse rate 84, oxygen saturation 92 to96%, her lungs were clear to auscultation bilaterally, heart S1-S2 present and rhythmic, abdomen soft, no lower extremity edema. Sodium 137, potassium 3.2, chloride 94, bicarb 29, glucose 132, BUN 18, creatinine 4.9, white count 3.9, hemoglobin 11.1, hematocrit 34.6, platelets 169.SARS COVID-19 positive. Chest radiograph with bilateral interstitial infiltrates, predominantly left base, personally reviewed. CT angio head and neck no acute changes. EKG 85 bpm, normal axis, normal intervals, normal sinus, no ST segment or T wave changes.  Patient was admitted to the hospitalwith aworking diagnosis of acute metabolic encephalopathy in the setting of SARS COVID-19 infection/ viral pneumonia.  Patient with elevated inflammatory markers and significant risk factors for worsening COVID 19 viral pneumonia, treated with Remdesivir and systemic steroids  Patient responded well to medical therapy.  1.  Acute  hypoxic respiratory failure due to SARS COVID-19 viral pneumonia.  Patient was admitted to the medical ward, she received supplemental oxygen per nasal cannula, medical therapy with remdesivir and systemic corticosteroids.  She received antitussive agents and airway clearing techniques with incentive spirometer and flutter valve.  Her symptoms and inflammatory markers improved.   COVID-19 Labs  Recent Labs    12/18/19 0659 12/19/19 0311 12/20/19 0500  DDIMER 1.51* 1.34* 0.96*  FERRITIN  --  713* 2,476*  CRP 7.6* 6.4* 4.9*    Lab Results  Component Value Date   SARSCOV2NAA POSITIVE (A) 12/16/2019   Patient's oximetry at discharge is 100% on room air.  2.  Metabolic encephalopathy with delirium.  Likely multifactorial in the setting of acute viral infection, SARS COVID-19.  Patient received supportive medical therapy with improvement of her mentation, at discharge she is back to her baseline, continue escitalopram.  She was seen by physical therapy and occupational therapy with no follow-up recommended.  3.  End-stage renal disease on hemodialysis.  Patient received renal replacement therapy while hospitalized with no major complications, continue redness, PhosLo and calcitriol.  Discharge patient will resume furosemide.  4.  Hypertension.  Blood pressure control with carvedilol and losartan.  5.  Uncontrolled type 2 diabetes mellitus (hemoglobin 123456 8.6) complicated with steroid-induced hyperglycemia.  Dyslipidemia.  Patient received insulin therapy: basal, pre-meal and sliding scale.  At discharge steroids will be discontinued and patient will resume her regular insulin regimen.  Continue rosuvastatin.   Discharge Diagnoses:  Principal Problem:   COVID-19 virus infection Active Problems:   DM (diabetes mellitus), secondary uncontrolled ([place])   ESRD (end stage renal disease) ([place])   Acute metabolic encephalopathy   Pneumonia due to COVID-19 virus    Discharge  Instructions   Allergies as of 12/20/2019  Reactions   Penicillins Rash   Did it involve swelling of the face/tongue/throat, SOB, or low BP? No Did it involve sudden or severe rash/hives, skin peeling, or any reaction on the inside of your mouth or nose? Yes Did you need to seek medical attention at a hospital or doctor's office? N/A When did it last happen?N/A If all above answers are "NO", may proceed with cephalosporin use.      Medication List    TAKE these medications   Accu-Chek Guide test strip Generic drug: glucose blood USE TO CHECK BG UP TO 4 TIMES A DAY (DX: E11.22 - DIABETES WITH CKD AND INSULIN THERAPY) IN VITRO   allopurinol 100 MG tablet Commonly known as: ZYLOPRIM Take 200 mg by mouth daily.   aspirin 81 MG tablet Take 81 mg by mouth daily.   calcitRIOL 0.25 MCG capsule Commonly known as: ROCALTROL Take 0.25 mcg by mouth daily.   calcium acetate 667 MG capsule Commonly known as: PHOSLO Take 667 mg by mouth 3 (three) times daily with meals.   carvedilol 25 MG tablet Commonly known as: COREG Take 25 mg by mouth 2 (two) times daily with a meal.   Dialyvite/Zinc Tabs Take 1 tablet by mouth every evening.   escitalopram 5 MG tablet Commonly known as: LEXAPRO Take 5 mg by mouth daily.   famotidine 20 MG tablet Commonly known as: PEPCID Take 20 mg by mouth daily.   furosemide 40 MG tablet Commonly known as: LASIX Take 80 mg by mouth daily.   FUSION PLUS PO Take 1 tablet by mouth daily.   guaiFENesin-dextromethorphan 100-10 MG/5ML syrup Commonly known as: ROBITUSSIN DM Take 5 mLs by mouth every 6 (six) hours as needed for cough.   HumuLIN 70/30 (70-30) 100 UNIT/ML injection Generic drug: insulin NPH-regular Human Inject 26 Units into the skin daily with breakfast. Inject 26 Units into the skin daily with breakfast. What changed:   how much to take  additional instructions   lisinopril 10 MG tablet Commonly known as:  ZESTRIL Take 10 mg by mouth daily.   loratadine 10 MG tablet Commonly known as: CLARITIN Take 10 mg by mouth daily.   losartan 25 MG tablet Commonly known as: COZAAR Take 25 mg by mouth daily.   omeprazole 20 MG capsule Commonly known as: PRILOSEC Take 1 capsule (20 mg total) by mouth daily.   RELION INSULIN SYR 0.5ML/31G 31G X 5/16" 0.5 ML Misc Generic drug: Insulin Syringe-Needle U-100 USE 1 ONCE DAILY   rosuvastatin 10 MG tablet Commonly known as: CRESTOR Take 10 mg by mouth every Monday, Wednesday, and Friday.   triamcinolone cream 0.1 % Commonly known as: KENALOG Apply 1 application topically daily as needed (apply to face and Groin area).   TUMS E-X PO Take 1 tablet by mouth daily.      Follow-up Information    [name], MD Follow up in 2 week(s).   Specialty: Internal Medicine Contact information: [address] [place] [address] [phone]          Allergies  Allergen Reactions  . Penicillins Rash    Did it involve swelling of the face/tongue/throat, SOB, or low BP? No Did it involve sudden or severe rash/hives, skin peeling, or any reaction on the inside of your mouth or nose? Yes Did you need to seek medical attention at a hospital or doctor's office? N/A When did it last happen?N/A If all above answers are "NO", may proceed with cephalosporin use.  Consultations:  Nephrology    Procedures/Studies: CT Angio Head W or Wo Contrast  Result Date: 12/16/2019 CLINICAL DATA:  Stroke. Progressive acute mental status changes during dialysis. EXAM: CT ANGIOGRAPHY HEAD AND NECK CT PERFUSION BRAIN TECHNIQUE: Multidetector CT imaging of the head and neck was performed using the standard protocol during bolus administration of intravenous contrast. Multiplanar CT image reconstructions and MIPs were obtained to evaluate the vascular anatomy. Carotid stenosis measurements (when applicable) are obtained utilizing NASCET  criteria, using the distal internal carotid diameter as the denominator. Multiphase CT imaging of the brain was performed following IV bolus contrast injection. Subsequent parametric perfusion maps were calculated using RAPID software. CONTRAST:  116mL OMNIPAQUE IOHEXOL 350 MG/ML SOLN COMPARISON:  MR head without contrast 06/26/2016 FINDINGS: CT HEAD FINDINGS Brain: No acute infarct, hemorrhage, or mass lesion is present. The ventricles are of normal size. No significant white matter lesions are present. The brainstem and cerebellum are within normal limits. Vascular: Atherosclerotic calcifications are present within the cavernous internal carotid arteries bilaterally. There is no hyperdense vessel. Skull: Calvarium is intact. No focal lytic or blastic lesions are present. No significant extracranial soft tissue lesion is present. Sinuses/Orbits: The paranasal sinuses and mastoid air cells are clear. The globes and orbits are within normal limits. ASPECTS ([place] Stroke Program Early CT Score) - Ganglionic level infarction (caudate, lentiform nuclei, internal capsule, insula, M1-M3 cortex): 7/7 - Supraganglionic infarction (M4-M6 cortex): 3/3 Total score (0-10 with 10 being normal): 10/10 Review of the MIP images confirms the above findings CTA NECK FINDINGS Aortic arch: A 3 vessel arch configuration is present. There is no significant atherosclerotic disease or stenosis. There is no aneurysm. Right carotid system: The proximal right common carotid artery is obscured by motion artifact. There is no significant stenosis in the right common carotid artery. Right MCA bifurcation is within normal limits. Cervical right ICA is normal. Left carotid system: The left common carotid artery is somewhat distorted by motion. No focal stenosis is present. Atherosclerotic calcifications are present at the bifurcation without significant stenosis. Cervical left ICA is otherwise normal. Vertebral arteries: The right vertebral  artery is the dominant vessel. There is some distortion at the C4-5 level. No other significant stenosis is present. Vertebral artery origins are within normal limits bilaterally. Skeleton: Is straightening and reversal of some the normal cervical lordosis. 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 Grade 1 retrolisthesis is likely exaggerated by artifact at C4-5. Focal lytic or blastic lesions are present. Other neck: The soft tissues the neck are otherwise unremarkable. Upper chest: Posterior airspace opacities are present on the right. The left lung is clear. Thoracic inlet is normal. Review of the MIP images confirms the above findings CTA HEAD FINDINGS Anterior circulation: Atherosclerotic calcifications are present within the cavernous internal carotid arteries bilaterally without a significant stenosis relative to the more distal vessels. ICA termini are normal bilaterally. The left A1 segment is dominant. The right A1 segment is hypoplastic or occluded. The anterior communicating artery is patent and both A2 segments fill. M1 segments are normal bilaterally. The MCA bifurcations are intact. There is some irregularity of distal ACA and MCA branch vessels without a significant proximal stenosis or occlusion. No aneurysm is present. Posterior circulation: The right vertebral artery is the dominant vessel. The left vertebral artery terminates at the PICA. The right AICA vessel is dominant. The basilar artery is normal. Both posterior cerebral arteries originate from the basilar tip. There is mild irregularity of the left  P2 segment with a 50% stenosis. Mild irregularity is present in the distal PCA branch vessels without other significant proximal stenosis or occlusion. Venous sinuses: The dural sinuses are patent. The straight sinus and deep cerebral veins are intact. Cortical veins are within normal limits. Anatomic variants: None Review of the MIP images confirms the above  findings CT Brain Perfusion Findings: ASPECTS: 10/10 CBF (<30%) Volume: 60mL Perfusion (Tmax>6.0s) volume: 18mL Mismatch Volume: 108mL Infarction Location:n/a IMPRESSION: 1. CT perfusion demonstrates no acute infarct or significant ischemia. 2. No emergent large vessel occlusion. 3. Absence of the right A1 segment is likely due to a hypoplastic or chronically occluded vessel. Anterior communicating artery and both A2 segments are normal. 4. Atherosclerotic changes are noted at the left carotid bifurcation and cavernous internal carotid arteries bilaterally without significant stenosis. 5. Hypoplastic left vertebral artery terminates at the PICA. 6. Mild narrowing of the proximal left PCA. 7. Mild diffuse distal small vessel disease without significant proximal stenosis or occlusion elsewhere within the circle[place]. These results were called by telephone at the time of interpretation on 12/16/2019 at 4:45 pm to provider [facility] , who verbally acknowledged these results. Electronically Signed   By: San Morelle M.D.   On: 12/16/2019 16:46   DG Chest 2 View  Result Date: 12/16/2019 CLINICAL DATA:  Altered mental status. EXAM: CHEST - 2 VIEW COMPARISON:  PA and lateral chest 06/26/2016. FINDINGS: Heart size is upper normal with pulmonary vascular congestion. No consolidative process, pneumothorax or effusion. No acute or focal bony abnormality. IMPRESSION: Pulmonary vascular congestion.  No acute disease. Electronically Signed   By: Inge Rise M.D.   On: 12/16/2019 15:58   CT Angio Neck W and/or Wo Contrast  Result Date: 12/16/2019 CLINICAL DATA:  Stroke. Progressive acute mental status changes during dialysis. EXAM: CT ANGIOGRAPHY HEAD AND NECK CT PERFUSION BRAIN TECHNIQUE: Multidetector CT imaging of the head and neck was performed using the standard protocol during bolus administration of intravenous contrast. Multiplanar CT image reconstructions and MIPs were obtained to evaluate  the vascular anatomy. Carotid stenosis measurements (when applicable) are obtained utilizing NASCET criteria, using the distal internal carotid diameter as the denominator. Multiphase CT imaging of the brain was performed following IV bolus contrast injection. Subsequent parametric perfusion maps were calculated using RAPID software. CONTRAST:  158mL OMNIPAQUE IOHEXOL 350 MG/ML SOLN COMPARISON:  MR head without contrast 06/26/2016 FINDINGS: CT HEAD FINDINGS Brain: No acute infarct, hemorrhage, or mass lesion is present. The ventricles are of normal size. No significant white matter lesions are present. The brainstem and cerebellum are within normal limits. Vascular: Atherosclerotic calcifications are present within the cavernous internal carotid arteries bilaterally. There is no hyperdense vessel. Skull: Calvarium is intact. No focal lytic or blastic lesions are present. No significant extracranial soft tissue lesion is present. Sinuses/Orbits: The paranasal sinuses and mastoid air cells are clear. The globes and orbits are within normal limits. ASPECTS ([place] Stroke Program Early CT Score) - Ganglionic level infarction (caudate, lentiform nuclei, internal capsule, insula, M1-M3 cortex): 7/7 - Supraganglionic infarction (M4-M6 cortex): 3/3 Total score (0-10 with 10 being normal): 10/10 Review of the MIP images confirms the above findings CTA NECK FINDINGS Aortic arch: A 3 vessel arch configuration is present. There is no significant atherosclerotic disease or stenosis. There is no aneurysm. Right carotid system: The proximal right common carotid artery is obscured by motion artifact. There is no significant stenosis in the right common carotid artery. Right MCA bifurcation is within normal limits. Cervical right  ICA is normal. Left carotid system: The left common carotid artery is somewhat distorted by motion. No focal stenosis is present. Atherosclerotic calcifications are present at the bifurcation without  significant stenosis. Cervical left ICA is otherwise normal. Vertebral arteries: The right vertebral artery is the dominant vessel. There is some distortion at the C4-5 level. No other significant stenosis is present. Vertebral artery origins are within normal limits bilaterally. Skeleton: Is straightening and reversal of some the normal cervical lordosis. 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 Grade 1 retrolisthesis is likely exaggerated by artifact at C4-5. Focal lytic or blastic lesions are present. Other neck: The soft tissues the neck are otherwise unremarkable. Upper chest: Posterior airspace opacities are present on the right. The left lung is clear. Thoracic inlet is normal. Review of the MIP images confirms the above findings CTA HEAD FINDINGS Anterior circulation: Atherosclerotic calcifications are present within the cavernous internal carotid arteries bilaterally without a significant stenosis relative to the more distal vessels. ICA termini are normal bilaterally. The left A1 segment is dominant. The right A1 segment is hypoplastic or occluded. The anterior communicating artery is patent and both A2 segments fill. M1 segments are normal bilaterally. The MCA bifurcations are intact. There is some irregularity of distal ACA and MCA branch vessels without a significant proximal stenosis or occlusion. No aneurysm is present. Posterior circulation: The right vertebral artery is the dominant vessel. The left vertebral artery terminates at the PICA. The right AICA vessel is dominant. The basilar artery is normal. Both posterior cerebral arteries originate from the basilar tip. There is mild irregularity of the left P2 segment with a 50% stenosis. Mild irregularity is present in the distal PCA branch vessels without other significant proximal stenosis or occlusion. Venous sinuses: The dural sinuses are patent. The straight sinus and deep cerebral veins are intact. Cortical veins  are within normal limits. Anatomic variants: None Review of the MIP images confirms the above findings CT Brain Perfusion Findings: ASPECTS: 10/10 CBF (<30%) Volume: 8mL Perfusion (Tmax>6.0s) volume: 65mL Mismatch Volume: 64mL Infarction Location:n/a IMPRESSION: 1. CT perfusion demonstrates no acute infarct or significant ischemia. 2. No emergent large vessel occlusion. 3. Absence of the right A1 segment is likely due to a hypoplastic or chronically occluded vessel. Anterior communicating artery and both A2 segments are normal. 4. Atherosclerotic changes are noted at the left carotid bifurcation and cavernous internal carotid arteries bilaterally without significant stenosis. 5. Hypoplastic left vertebral artery terminates at the PICA. 6. Mild narrowing of the proximal left PCA. 7. Mild diffuse distal small vessel disease without significant proximal stenosis or occlusion elsewhere within the circle[place]of[place]. These results were called by telephone at the time of interpretation on 12/16/2019 at 4:45 pm to provider [facility] , who verbally acknowledged these results. Electronically Signed   By: San Morelle M.D.   On: 12/16/2019 16:46   CT CEREBRAL PERFUSION W CONTRAST  Result Date: 12/16/2019 CLINICAL DATA:  Stroke. Progressive acute mental status changes during dialysis. EXAM: CT ANGIOGRAPHY HEAD AND NECK CT PERFUSION BRAIN TECHNIQUE: Multidetector CT imaging of the head and neck was performed using the standard protocol during bolus administration of intravenous contrast. Multiplanar CT image reconstructions and MIPs were obtained to evaluate the vascular anatomy. Carotid stenosis measurements (when applicable) are obtained utilizing NASCET criteria, using the distal internal carotid diameter as the denominator. Multiphase CT imaging of the brain was performed following IV bolus contrast injection. Subsequent parametric perfusion maps were calculated using RAPID software. CONTRAST:  151mL  OMNIPAQUE IOHEXOL 350 MG/ML SOLN COMPARISON:  MR head without contrast 06/26/2016 FINDINGS: CT HEAD FINDINGS Brain: No acute infarct, hemorrhage, or mass lesion is present. The ventricles are of normal size. No significant white matter lesions are present. The brainstem and cerebellum are within normal limits. Vascular: Atherosclerotic calcifications are present within the cavernous internal carotid arteries bilaterally. There is no hyperdense vessel. Skull: Calvarium is intact. No focal lytic or blastic lesions are present. No significant extracranial soft tissue lesion is present. Sinuses/Orbits: The paranasal sinuses and mastoid air cells are clear. The globes and orbits are within normal limits. ASPECTS ([place] Stroke Program Early CT Score) - Ganglionic level infarction (caudate, lentiform nuclei, internal capsule, insula, M1-M3 cortex): 7/7 - Supraganglionic infarction (M4-M6 cortex): 3/3 Total score (0-10 with 10 being normal): 10/10 Review of the MIP images confirms the above findings CTA NECK FINDINGS Aortic arch: A 3 vessel arch configuration is present. There is no significant atherosclerotic disease or stenosis. There is no aneurysm. Right carotid system: The proximal right common carotid artery is obscured by motion artifact. There is no significant stenosis in the right common carotid artery. Right MCA bifurcation is within normal limits. Cervical right ICA is normal. Left carotid system: The left common carotid artery is somewhat distorted by motion. No focal stenosis is present. Atherosclerotic calcifications are present at the bifurcation without significant stenosis. Cervical left ICA is otherwise normal. Vertebral arteries: The right vertebral artery is the dominant vessel. There is some distortion at the C4-5 level. No other significant stenosis is present. Vertebral artery origins are within normal limits bilaterally. Skeleton: Is straightening and reversal of some the normal cervical lordosis.  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 Grade 1 retrolisthesis is likely exaggerated by artifact at C4-5. Focal lytic or blastic lesions are present. Other neck: The soft tissues the neck are otherwise unremarkable. Upper chest: Posterior airspace opacities are present on the right. The left lung is clear. Thoracic inlet is normal. Review of the MIP images confirms the above findings CTA HEAD FINDINGS Anterior circulation: Atherosclerotic calcifications are present within the cavernous internal carotid arteries bilaterally without a significant stenosis relative to the more distal vessels. ICA termini are normal bilaterally. The left A1 segment is dominant. The right A1 segment is hypoplastic or occluded. The anterior communicating artery is patent and both A2 segments fill. M1 segments are normal bilaterally. The MCA bifurcations are intact. There is some irregularity of distal ACA and MCA branch vessels without a significant proximal stenosis or occlusion. No aneurysm is present. Posterior circulation: The right vertebral artery is the dominant vessel. The left vertebral artery terminates at the PICA. The right AICA vessel is dominant. The basilar artery is normal. Both posterior cerebral arteries originate from the basilar tip. There is mild irregularity of the left P2 segment with a 50% stenosis. Mild irregularity is present in the distal PCA branch vessels without other significant proximal stenosis or occlusion. Venous sinuses: The dural sinuses are patent. The straight sinus and deep cerebral veins are intact. Cortical veins are within normal limits. Anatomic variants: None Review of the MIP images confirms the above findings CT Brain Perfusion Findings: ASPECTS: 10/10 CBF (<30%) Volume: 80mL Perfusion (Tmax>6.0s) volume: 57mL Mismatch Volume: 82mL Infarction Location:n/a IMPRESSION: 1. CT perfusion demonstrates no acute infarct or significant ischemia. 2. No emergent large  vessel occlusion. 3. Absence of the right A1 segment is likely due to a hypoplastic or chronically occluded vessel. Anterior communicating artery and  both A2 segments are normal. 4. Atherosclerotic changes are noted at the left carotid bifurcation and cavernous internal carotid arteries bilaterally without significant stenosis. 5. Hypoplastic left vertebral artery terminates at the PICA. 6. Mild narrowing of the proximal left PCA. 7. Mild diffuse distal small vessel disease without significant proximal stenosis or occlusion elsewhere within the circle[place]of-[place]. These results were called by telephone at the time of interpretation on 12/16/2019 at 4:45 pm to provider [facility] , who verbally acknowledged these results. Electronically Signed   By: San Morelle M.D.   On: 12/16/2019 16:46      Procedures:   Subjective: Patient is feeling better, no further confusion or agitation, no dyspnea or chest pain.   Discharge Exam: Vitals:   12/19/19 2210 12/20/19 0516  BP: 133/61 (!) 163/76  Pulse: 72 69  Resp: 20 16  Temp: 98.7 F (37.1 C) 98.5 F (36.9 C)  SpO2: 98% 100%   Vitals:   12/19/19 1440 12/19/19 2210 12/20/19 0427 12/20/19 0516  BP: (!) 141/48 133/61  (!) 163/76  Pulse: 84 72  69  Resp: 20 20  16   Temp: 98.1 F (36.7 C) 98.7 F (37.1 C)  98.5 F (36.9 C)  TempSrc: Oral Oral  Oral  SpO2: 96% 98%  100%  Weight:   88.2 kg   Height:        General: Not in pain or dyspnea.  Neurology: Awake and alert, non focal  E ENT: no pallor, no icterus, oral mucosa moist Cardiovascular: No JVD. S1-S2 present, rhythmic. No lower extremity edema. Pulmonary: vesicular breath sounds bilaterally. Gastrointestinal. Abdomen with no organomegaly, non tender, no rebound or guarding Skin. No rashes Musculoskeletal: no joint deformities   The results of significant diagnostics from this hospitalization (including imaging, microbiology, ancillary and laboratory) are listed below  for reference.     Microbiology: Recent Results (from the past 240 hour(s))  SARS CORONAVIRUS 2 (TAT 6-24 HRS) Nasopharyngeal Nasopharyngeal Swab     Status: Abnormal   Collection Time: 12/16/19  2:05 PM   Specimen: Nasopharyngeal Swab  Result Value Ref Range Status   SARS Coronavirus 2 POSITIVE (A) NEGATIVE Final    Comment: RESULT CALLED TO, READ BACK BY AND VERIFIED WITH: L.CHILTON,RN 2056 [id number] I.MANNING (NOTE) SARS-CoV-2 target nucleic acids are DETECTED. The SARS-CoV-2 RNA is generally detectable in upper and lower respiratory specimens during the acute phase of infection. Positive results are indicative of the presence of SARS-CoV-2 RNA. Clinical correlation with patient history and other diagnostic information is  necessary to determine patient infection status. Positive results do not rule out bacterial infection or co-infection with other viruses.  The expected result is Negative. Fact Sheet for Patients: SugarRoll.be Fact Sheet for Healthcare Providers: https://www.woods-mathews.com/ This test is not yet approved or cleared by the [place] FDA and  has been authorized for detection and/or diagnosis of SARS-CoV-2 by FDA under an Emergency Use Authorization (EUA). This EUA will remain  in effect (meaning this test can be used) for th e duration of the COVID-19 declaration under Section 564(b)(1) of the Act, 21 U.S.C. section 360bbb-3(b)(1), unless the authorization is terminated or revoked sooner. Performed at [facility], [place] [address] [place], [place] [address]   MRSA PCR Screening     Status: None   Collection Time: 12/18/19  2:46 AM   Specimen: Nasal Mucosa; Nasopharyngeal  Result Value Ref Range Status   MRSA by PCR NEGATIVE NEGATIVE Final    Comment:  The GeneXpert MRSA Assay (FDA approved for NASAL specimens only), is one component of a comprehensive MRSA colonization surveillance program. It  is not intended to diagnose MRSA infection nor to guide or monitor treatment for MRSA infections. Performed at [facility], [place] [address], [place], [place] [address]      Labs: BNP (last 3 results) No results for input(s): BNP in the last 8760 hours. Basic Metabolic Panel: Recent Labs  Lab 12/16/19 1125 12/17/19 0411 12/18/19 0659 12/19/19 0311 12/20/19 0500  NA 137 136 132* 136 135  K 3.2* 3.7 3.9 4.3 4.4  CL 94* 95* 91* 95* 96*  CO2 29 26 22 24 26   GLUCOSE 132* 118* 223* 285* 215*  BUN 18 31* 60* 32* 66*  CREATININE 4.95* 7.47* 10.31* 6.26* 8.61*  CALCIUM 8.3* 7.8* 7.8* 7.8* 7.9*   Liver Function Tests: Recent Labs  Lab 12/17/19 0411 12/18/19 0659 12/19/19 0311 12/20/19 0500  AST 38 50* 52* 40  ALT 21 24 29 27   ALKPHOS 47 50 48 57  BILITOT 0.6 0.2* 0.2* 0.5  PROT 6.1* 6.2* 5.6* 6.0*  ALBUMIN 2.9* 2.9* 2.8* 2.7*   No results for input(s): LIPASE, AMYLASE in the last 168 hours. No results for input(s): AMMONIA in the last 168 hours. CBC: Recent Labs  Lab 12/16/19 1125 12/17/19 0411 12/18/19 0659  WBC 3.9* 2.8* 3.6*  NEUTROABS 2.7 2.0 2.8  HGB 11.1* 10.0* 10.0*  HCT 34.6* 31.2* 30.4*  MCV 91.5 91.5 89.1  PLT 169 144* 146*   Cardiac Enzymes: No results for input(s): CKTOTAL, CKMB, CKMBINDEX, TROPONINI in the last 168 hours. BNP: Invalid input(s): POCBNP CBG: Recent Labs  Lab 12/19/19 0753 12/19/19 1145 12/19/19 1650 12/19/19 2210 12/20/19 0819  GLUCAP 244* 388* 328* 195* 196*   D-Dimer Recent Labs    12/19/19 0311 12/20/19 0500  DDIMER 1.34* 0.96*   Hgb A1c No results for input(s): HGBA1C in the last 72 hours. Lipid Profile No results for input(s): CHOL, HDL, LDLCALC, TRIG, CHOLHDL, LDLDIRECT in the last 72 hours. Thyroid function studies No results for input(s): TSH, T4TOTAL, T3FREE, THYROIDAB in the last 72 hours.  Invalid input(s): FREET3 Anemia work up Recent Labs    12/19/19 0311 12/20/19 0500  FERRITIN 713* 2,476*    Urinalysis    Component Value Date/Time   COLORURINE YELLOW 06/26/2016 1135   APPEARANCEUR CLOUDY (A) 06/26/2016 1135   LABSPEC 1.018 06/26/2016 1135   PHURINE 5.5 06/26/2016 1135   GLUCOSEU 250 (A) 06/26/2016 1135   HGBUR SMALL (A) 06/26/2016 1135   BILIRUBINUR NEGATIVE 06/26/2016 1135   KETONESUR NEGATIVE 06/26/2016 1135   PROTEINUR >300 (A) 06/26/2016 1135   UROBILINOGEN 0.2 03/05/2014 0013   NITRITE NEGATIVE 06/26/2016 1135   LEUKOCYTESUR SMALL (A) 06/26/2016 1135   Sepsis Labs Invalid input(s): PROCALCITONIN,  WBC,  LACTICIDVEN Microbiology Recent Results (from the past 240 hour(s))  SARS CORONAVIRUS 2 (TAT 6-24 HRS) Nasopharyngeal Nasopharyngeal Swab     Status: Abnormal   Collection Time: 12/16/19  2:05 PM   Specimen: Nasopharyngeal Swab  Result Value Ref Range Status   SARS Coronavirus 2 POSITIVE (A) NEGATIVE Final    Comment: RESULT CALLED TO, READ BACK BY AND VERIFIED WITH: L.CHILTON,RN 2056 [id number] I.MANNING (NOTE) SARS-CoV-2 target nucleic acids are DETECTED. The SARS-CoV-2 RNA is generally detectable in upper and lower respiratory specimens during the acute phase of infection. Positive results are indicative of the presence of SARS-CoV-2 RNA. Clinical correlation with patient history and other diagnostic information is  necessary to determine patient infection status. Positive results do not rule out bacterial infection or co-infection with other viruses.  The expected result is Negative. Fact Sheet for Patients: SugarRoll.be Fact Sheet for Healthcare Providers: https://www.woods-mathews.com/ This test is not yet approved or cleared by the [place] FDA and  has been authorized for detection and/or diagnosis of SARS-CoV-2 by FDA under an Emergency Use Authorization (EUA). This EUA will remain  in effect (meaning this test can be used) for th e duration of the COVID-19 declaration under Section 564(b)(1) of the  Act, 21 U.S.C. section 360bbb-3(b)(1), unless the authorization is terminated or revoked sooner. Performed at [facility], [place] [address] [place], [place] [address]   MRSA PCR Screening     Status: None   Collection Time: 12/18/19  2:46 AM   Specimen: Nasal Mucosa; Nasopharyngeal  Result Value Ref Range Status   MRSA by PCR NEGATIVE NEGATIVE Final    Comment:        The GeneXpert MRSA Assay (FDA approved for NASAL specimens only), is one component of a comprehensive MRSA colonization surveillance program. It is not intended to diagnose MRSA infection nor to guide or monitor treatment for MRSA infections. Performed at [facility], [place] [address], [place], [place] [address]      Time coordinating discharge: 45 minutes  SIGNED:   Tawni Millers, MD  [facility] 12/20/2019, 9:25 AM

## 2019-12-20 NOTE — Progress Notes (Addendum)
[facility] Progress Note   Subjective:  Improved. May be able to discharge today.   Objective Vitals:   12/19/19 1440 12/19/19 2210 12/20/19 0427 12/20/19 0516  BP: (!) 141/48 133/61  (!) 163/76  Pulse: 84 72  69  Resp: 20 20  16   Temp: 98.1 F (36.7 C) 98.7 F (37.1 C)  98.5 F (36.9 C)  TempSrc: Oral Oral  Oral  SpO2: 96% 98%  100%  Weight:   88.2 kg   Height:         Weight change: -0.8 kg   Additional Objective Labs: Basic Metabolic Panel: Recent Labs  Lab 12/18/19 0659 12/19/19 0311 12/20/19 0500  NA 132* 136 135  K 3.9 4.3 4.4  CL 91* 95* 96*  CO2 22 24 26   GLUCOSE 223* 285* 215*  BUN 60* 32* 66*  CREATININE 10.31* 6.26* 8.61*  CALCIUM 7.8* 7.8* 7.9*   CBC: Recent Labs  Lab 12/16/19 1125 12/17/19 0411 12/18/19 0659  WBC 3.9* 2.8* 3.6*  NEUTROABS 2.7 2.0 2.8  HGB 11.1* 10.0* 10.0*  HCT 34.6* 31.2* 30.4*  MCV 91.5 91.5 89.1  PLT 169 144* 146*   Blood Culture No results found for: SDES, SPECREQUEST, CULT, REPTSTATUS   Medications:  . allopurinol  200 mg Oral Daily  . aspirin EC  81 mg Oral Daily  . calcitRIOL  0.25 mcg Oral Q T,Th,Sa-HD  . calcium acetate  667 mg Oral TID WC  . carvedilol  25 mg Oral BID WC  . Chlorhexidine Gluconate Cloth  6 each Topical Q0600  . [START ON 12/25/2019] darbepoetin (ARANESP) injection - DIALYSIS  40 mcg Intravenous Q Thu-HD  . dexamethasone (DECADRON) injection  6 mg Intravenous Daily  . escitalopram  5 mg Oral Daily  . famotidine  20 mg Oral Daily  . feeding supplement (NEPRO CARB STEADY)  237 mL Oral TID WC  . heparin  5,000 Units Subcutaneous Q8H  . insulin aspart  0-9 Units Subcutaneous TID WC  . insulin aspart  3 Units Subcutaneous TID WC  . insulin glargine  20 Units Subcutaneous Daily  . loratadine  10 mg Oral Daily  . losartan  25 mg Oral Daily  . multivitamin  1 tablet Oral QPM  . pantoprazole  40 mg Oral Daily  . pneumococcal 23 valent vaccine  0.5 mL Intramuscular  Tomorrow-1000  . rosuvastatin  10 mg Oral Q M,W,F   Physical Exam Due to the nature of this patient's [id number] with isolation and in keeping with efforts to prevent the spread of infection and to conserve personal protective equipment, a physical exam was not personally performed.  A chart review of other providers notes and the patient's lab work as well as review of other pertinent studies was performed.     Dialysis Orders:  TTS first shift EDW 87 GO 400/ A 1.5 2 K 2.25 Ca UFP 4  heparin 8400  Mircera 30 q 4 - last 12/3 calcitriol 0.75   Assessment/Plan: 1. COVID-19 PNA/AMS. Completed remdesivir/decadron course -per primary  2. ESRD - HD TTS. Next HD 12/27 per holiday schedule.  Will need resume OP HD on GO COVID shift.   3. HTN/volume- BP slightly elevated. Continue home meds. UF to EDW as tolerated  4. Anemia- Hgb 10. Continue ESA as outpatient.  5. Metabolic Bone Disease- Corr Ca ok. On PhosLo binder/VDRA.  6. Nutrition - Prot supp for low albumin   Lynnda Child PA-C [place] Pager [phone] 12/20/2019,9:36 AM  LOS: 4 days

## 2019-12-21 ENCOUNTER — Telehealth: Payer: Self-pay | Admitting: Nephrology

## 2019-12-21 NOTE — Telephone Encounter (Signed)
Transition of care contact from inpatient facility   Date of discharge: 12/20/19 Date of contact: 12/21/19  Method of contact: Phone, spoke with daughter,  Katie Garcia  Patient contacted to discuss transition of care from recent inpatient hospitalization. Patient was admitted to [facility] from 12/22 - 12/20/19 with diagnosis of COVID-19 viral pneumonia.   Medication changes reviewed Patient will follow up with outpatient dialysis center: Today, currently at dialysis.  Other follow up needs: Will see primary care in 2 weeks.

## 2020-02-13 ENCOUNTER — Other Ambulatory Visit: Payer: Self-pay

## 2020-02-13 ENCOUNTER — Ambulatory Visit: Payer: Medicare Other | Admitting: Podiatry

## 2020-02-13 ENCOUNTER — Encounter: Payer: Self-pay | Admitting: Podiatry

## 2020-02-13 DIAGNOSIS — M79674 Pain in right toe(s): Secondary | ICD-10-CM

## 2020-02-13 DIAGNOSIS — L84 Corns and callosities: Secondary | ICD-10-CM | POA: Diagnosis not present

## 2020-02-13 DIAGNOSIS — E1122 Type 2 diabetes mellitus with diabetic chronic kidney disease: Secondary | ICD-10-CM | POA: Diagnosis not present

## 2020-02-13 DIAGNOSIS — N186 End stage renal disease: Secondary | ICD-10-CM | POA: Diagnosis not present

## 2020-02-13 DIAGNOSIS — B351 Tinea unguium: Secondary | ICD-10-CM | POA: Diagnosis not present

## 2020-02-13 DIAGNOSIS — M79675 Pain in left toe(s): Secondary | ICD-10-CM | POA: Diagnosis not present

## 2020-02-13 NOTE — Patient Instructions (Signed)
Diabetes Mellitus and Foot Care Foot care is an important part of your health, especially when you have diabetes. Diabetes may cause you to have problems because of poor blood flow (circulation) to your feet and legs, which can cause your skin to:  Become thinner and drier.  Break more easily.  Heal more slowly.  Peel and crack. You may also have nerve damage (neuropathy) in your legs and feet, causing decreased feeling in them. This means that you may not notice minor injuries to your feet that could lead to more serious problems. Noticing and addressing any potential problems early is the best way to prevent future foot problems. How to care for your feet Foot hygiene  Wash your feet daily with warm water and mild soap. Do not use hot water. Then, pat your feet and the areas between your toes until they are completely dry. Do not soak your feet as this can dry your skin.  Trim your toenails straight across. Do not dig under them or around the cuticle. File the edges of your nails with an emery board or nail file.  Apply a moisturizing lotion or petroleum jelly to the skin on your feet and to dry, brittle toenails. Use lotion that does not contain alcohol and is unscented. Do not apply lotion between your toes. Shoes and socks  Wear clean socks or stockings every day. Make sure they are not too tight. Do not wear knee-high stockings since they may decrease blood flow to your legs.  Wear shoes that fit properly and have enough cushioning. Always look in your shoes before you put them on to be sure there are no objects inside.  To break in new shoes, wear them for just a few hours a day. This prevents injuries on your feet. Wounds, scrapes, corns, and calluses  Check your feet daily for blisters, cuts, bruises, sores, and redness. If you cannot see the bottom of your feet, use a mirror or ask someone for help.  Do not cut corns or calluses or try to remove them with medicine.  If you  find a minor scrape, cut, or break in the skin on your feet, keep it and the skin around it clean and dry. You may clean these areas with mild soap and water. Do not clean the area with peroxide, alcohol, or iodine.  If you have a wound, scrape, corn, or callus on your foot, look at it several times a day to make sure it is healing and not infected. Check for: ? Redness, swelling, or pain. ? Fluid or blood. ? Warmth. ? Pus or a bad smell. General instructions  Do not cross your legs. This may decrease blood flow to your feet.  Do not use heating pads or hot water bottles on your feet. They may burn your skin. If you have lost feeling in your feet or legs, you may not know this is happening until it is too late.  Protect your feet from hot and cold by wearing shoes, such as at the beach or on hot pavement.  Schedule a complete foot exam at least once a year (annually) or more often if you have foot problems. If you have foot problems, report any cuts, sores, or bruises to your health care provider immediately. Contact a health care provider if:  You have a medical condition that increases your risk of infection and you have any cuts, sores, or bruises on your feet.  You have an injury that is not   healing.  You have redness on your legs or feet.  You feel burning or tingling in your legs or feet.  You have pain or cramps in your legs and feet.  Your legs or feet are numb.  Your feet always feel cold.  You have pain around a toenail. Get help right away if:  You have a wound, scrape, corn, or callus on your foot and: ? You have pain, swelling, or redness that gets worse. ? You have fluid or blood coming from the wound, scrape, corn, or callus. ? Your wound, scrape, corn, or callus feels warm to the touch. ? You have pus or a bad smell coming from the wound, scrape, corn, or callus. ? You have a fever. ? You have a red line going up your leg. Summary  Check your feet every day  for cuts, sores, red spots, swelling, and blisters.  Moisturize feet and legs daily.  Wear shoes that fit properly and have enough cushioning.  If you have foot problems, report any cuts, sores, or bruises to your health care provider immediately.  Schedule a complete foot exam at least once a year (annually) or more often if you have foot problems. This information is not intended to replace advice given to you by your health care provider. Make sure you discuss any questions you have with your health care provider. Document Revised: 09/03/2019 Document Reviewed: 01/12/2017 Elsevier Patient Education  2020 Elsevier Inc.  

## 2020-02-19 NOTE — Progress Notes (Signed)
Subjective: Katie Garcia presents today for follow up of preventative diabetic foot care and callus(es) plantar aspect of both feet and painful mycotic toenails b/l that are difficult to trim. Pain interferes with ambulation. Aggravating factors include wearing enclosed shoe gear. Pain is relieved with periodic professional debridement.   Katie Garcia relates she did contract COVID-19 before Christmas and was hospitalized. She relates she and her family contracted it. They have all recovered.  Allergies  Allergen Reactions  . Penicillins Rash    Did it involve swelling of the face/tongue/throat, SOB, or low BP? No Did it involve sudden or severe rash/hives, skin peeling, or any reaction on the inside of your mouth or nose? Yes Did you need to seek medical attention at a hospital or doctor's office? N/A When did it last happen?N/A If all above answers are "NO", may proceed with cephalosporin use.     Objective: There were no vitals filed for this visit.  Vascular Examination:  Capillary fill time to digits <3s b/l, palpable DP pulses b/l, faintly palpable PT pulses b/l, pedal hair absent b/l, skin temperature gradient within normal limits b/l and nonpitting edema noted b/l LE  Dermatological Examination: Pedal skin with normal turgor, texture and tone bilaterally, no open wounds bilaterally, no interdigital macerations bilaterally, toenails 1-5 b/l elongated, dystrophic, thickened, crumbly with subungual debris, incurvated nailplate b/l great toes b/l borders with tenderness to palpation. No erythema, no edema, no drainage, no flocculence and hyperkeratotic lesion(s) submet head 5 b/l.  No erythema, no edema, no drainage, no flocculence  Musculoskeletal: Normal muscle strength 5/5 to all lower extremity muscle groups bilaterally, no gross bony deformities bilaterally and no pain crepitus or joint limitation noted with ROM b/l  Neurological: Protective sensation intact 5/5 intact  bilaterally with 10g monofilament b/l and vibratory sensation intact b/l  Assessment: 1. Pain due to onychomycosis of toenails of both feet   2. Callus   3. Diabetes mellitus with ESRD (end-stage renal disease) ([place])    Plan: -Continue diabetic foot care principles. Literature dispensed on today.  -Toenails 1-5 b/l were debrided in length and girth with sterile nail nippers and dremel without iatrogenic bleeding. -Calluses were debrided without complication or incident. Total number debrided =2, submet head 5 b/l. -Patient to continue soft, supportive shoe gear daily. -Patient to report any pedal injuries to medical professional immediately. -Patient/POA to call should there be question/concern in the interim.  Return in about 3 months (around 05/12/2020) for diabetic nail and callus trim.

## 2020-03-25 DIAGNOSIS — T829XXA Unspecified complication of cardiac and vascular prosthetic device, implant and graft, initial encounter: Secondary | ICD-10-CM | POA: Insufficient documentation

## 2020-03-28 ENCOUNTER — Other Ambulatory Visit: Payer: Self-pay | Admitting: Endocrinology

## 2020-03-28 DIAGNOSIS — IMO0002 Reserved for concepts with insufficient information to code with codable children: Secondary | ICD-10-CM

## 2020-03-28 DIAGNOSIS — E1365 Other specified diabetes mellitus with hyperglycemia: Secondary | ICD-10-CM

## 2020-03-28 NOTE — Telephone Encounter (Signed)
1.  Please schedule f/u appt 2.  Then please refill x 1, pending that appt.  

## 2020-04-01 ENCOUNTER — Telehealth: Payer: Self-pay | Admitting: Endocrinology

## 2020-04-01 NOTE — Telephone Encounter (Signed)
Please advise 

## 2020-04-01 NOTE — Telephone Encounter (Signed)
1.  Please schedule f/u appt 2.  Then please refill x 1, pending that appt.  

## 2020-04-01 NOTE — Telephone Encounter (Signed)
MEDICATION: novolin 70/30  PHARMACY:   [facility], [place] Phone:  [phone]  Fax:  [phone]     IS THIS A 90 DAY SUPPLY : "one bottle at a time"  IS PATIENT OUT OF MEDICATION: yes  IF NOT; HOW MUCH IS LEFT:   LAST APPOINTMENT DATE: 08/29/2019  NEXT APPOINTMENT DATE:@Visit  date not found  DO WE HAVE YOUR PERMISSION TO LEAVE A DETAILED MESSAGE: yes  OTHER COMMENTS: Patient requesting refill for novolin but I explained to her I only saw humulin in the patient's chart - she says she wants novolin because that's what she's been taking.   **Let patient know to contact pharmacy at the end of the day to make sure medication is ready. **  ** Please notify patient to allow 48-72 hours to process**  **Encourage patient to contact the pharmacy for refills or they can request refills through [facility]**

## 2020-04-01 NOTE — Telephone Encounter (Signed)
LVM requesting returned call 

## 2020-04-02 ENCOUNTER — Other Ambulatory Visit: Payer: Self-pay

## 2020-04-02 ENCOUNTER — Telehealth: Payer: Self-pay | Admitting: Endocrinology

## 2020-04-02 DIAGNOSIS — IMO0002 Reserved for concepts with insufficient information to code with codable children: Secondary | ICD-10-CM

## 2020-04-02 DIAGNOSIS — E1365 Other specified diabetes mellitus with hyperglycemia: Secondary | ICD-10-CM

## 2020-04-02 MED ORDER — HUMULIN 70/30 (70-30) 100 UNIT/ML ~~LOC~~ SUSP: SUBCUTANEOUS | 0 refills | Status: DC

## 2020-04-02 MED ORDER — HUMULIN 70/30 (70-30) 100 UNIT/ML ~~LOC~~ SUSP: 32.0000 [IU] | Freq: Every day | SUBCUTANEOUS | 0 refills | Status: DC

## 2020-04-02 NOTE — Telephone Encounter (Signed)
Called pt and spoke with pt's daughter. Daughter stated that the pt is taking 26 untis daily and sugars are running 300-500. Pt's daugther was given MD message and Rx was sent because pt is out of insulin.

## 2020-04-02 NOTE — Telephone Encounter (Signed)
please contact patient: How much are you taking?  Please continue the same for now. I'll see you Monday.

## 2020-04-02 NOTE — Telephone Encounter (Signed)
Please clarify the dose you would like pt to be taking.

## 2020-04-02 NOTE — Telephone Encounter (Signed)
Pharmacy called requesting a new rx sent for humulin with more clear instructions, there are two different instructions on the prescription.

## 2020-04-02 NOTE — Telephone Encounter (Signed)
Outpatient Medication Detail   Disp Refills Start End   insulin NPH-regular Human (HUMULIN 70/30) (70-30) 100 UNIT/ML injection 8 mL 0 04/02/2020    Sig - Route: Inject 32 Units into the skin daily with breakfast. Inject 26 Units into the skin daily with breakfast. - Subcutaneous   Sent to pharmacy as: insulin NPH-regular Human (HUMULIN 70/30) (70-30) 100 UNIT/ML injection   E-Prescribing Status: Receipt confirmed by pharmacy (04/02/2020 10:59 AM EDT)

## 2020-04-02 NOTE — Telephone Encounter (Signed)
Patient scheduled for Monday April 12th.

## 2020-04-05 ENCOUNTER — Ambulatory Visit (INDEPENDENT_AMBULATORY_CARE_PROVIDER_SITE_OTHER): Payer: Medicare Other | Admitting: Endocrinology

## 2020-04-05 ENCOUNTER — Other Ambulatory Visit: Payer: Self-pay

## 2020-04-05 ENCOUNTER — Encounter: Payer: Self-pay | Admitting: Endocrinology

## 2020-04-05 DIAGNOSIS — E1365 Other specified diabetes mellitus with hyperglycemia: Secondary | ICD-10-CM | POA: Diagnosis not present

## 2020-04-05 DIAGNOSIS — IMO0002 Reserved for concepts with insufficient information to code with codable children: Secondary | ICD-10-CM

## 2020-04-05 MED ORDER — HUMULIN 70/30 (70-30) 100 UNIT/ML ~~LOC~~ SUSP: 26.0000 [IU] | Freq: Every day | SUBCUTANEOUS | 11 refills | Status: DC

## 2020-04-05 NOTE — Progress Notes (Signed)
Subjective:    Patient ID: Katie Garcia, female    DOB: 05/20/1941, 79 y.o.   MRN: [id number]  HPI Pt returns for f/u of diabetes mellitus:  DM type: Insulin-requiring type 2 Dx'ed: 5465 Complications: polyneuropathy, DR, and ESRD.  Therapy: insulin since 2004.   GDM: 1970 DKA: never.   Severe hypoglycemia: last episode was 2017.    Pancreatitis: never.   Pancreatic imaging: never.  Other: she takes QD insulin, after poor results with multiple daily injections; she was changed to 70/30 qam, due to pattern of cbg's; fructosamine has confirmed A1c.   Interval history: no cbg record, but states cbg varies from 49-500.  It is in general lowest after a smaller-than-expected meal.  pt states she feels well in general.  Pt says she never misses the insulin.   Past Medical History:  Diagnosis Date  . Anemia   . Arthritis   . Chronic kidney disease   . Diabetes mellitus without complication ([place])   . GERD (gastroesophageal reflux disease)   . Headache   . Hypertension     Past Surgical History:  Procedure Laterality Date  . ABDOMINAL HYSTERECTOMY    . AV FISTULA PLACEMENT Left 10/02/2016   Procedure: ARTERIOVENOUS (AV) FISTULA CREATION LEFT UPPER ARM;  Surgeon: Waynetta Sandy, MD;  Location: [facility];  Service: Vascular;  Laterality: Left;  . CESAREAN SECTION      Social History   Socioeconomic History  . Marital status: Married    Spouse name: Not on file  . Number of children: Not on file  . Years of education: Not on file  . Highest education level: Not on file  Occupational History  . Not on file  Tobacco Use  . Smoking status: Never Smoker  . Smokeless tobacco: Never Used  Substance and Sexual Activity  . Alcohol use: No  . Drug use: No  . Sexual activity: Not on file  Other Topics Concern  . Not on file  Social History Narrative  . Not on file   Social Determinants of Health   Financial Resource Strain:   . Difficulty of Paying Living Expenses:    Food Insecurity:   . Worried About Charity fundraiser in the Last Year:   . Arboriculturist in the Last Year:   Transportation Needs:   . Film/video editor (Medical):   Marland Kitchen Lack of Transportation (Non-Medical):   Physical Activity:   . Days of Exercise per Week:   . Minutes of Exercise per Session:   Stress:   . Feeling of Stress :   Social Connections:   . Frequency of Communication with Friends and Family:   . Frequency of Social Gatherings with Friends and Family:   . Attends Religious Services:   . Active Member of Clubs or Organizations:   . Attends Archivist Meetings:   Marland Kitchen Marital Status:   Intimate Partner Violence:   . Fear of Current or Ex-Partner:   . Emotionally Abused:   Marland Kitchen Physically Abused:   . Sexually Abused:     Current Outpatient Medications on File Prior to Visit  Medication Sig Dispense Refill  . ACCU-CHEK GUIDE test strip USE TO CHECK BG UP TO 4 TIMES A DAY (DX: E11.22 - DIABETES WITH CKD AND INSULIN THERAPY) IN VITRO  1  . allopurinol (ZYLOPRIM) 100 MG tablet Take 200 mg by mouth daily.     Marland Kitchen aspirin 81 MG tablet Take 81 mg by mouth daily.    Marland Kitchen  B Complex-C-Zn-Folic Acid (DIALYVITE/ZINC) TABS Take 1 tablet by mouth every evening.   3  . calcitRIOL (ROCALTROL) 0.25 MCG capsule Take 0.25 mcg by mouth daily.    . calcium acetate (PHOSLO) 667 MG capsule Take 667 mg by mouth 3 (three) times daily with meals.   3  . Calcium Carbonate Antacid (TUMS E-X PO) Take 1 tablet by mouth daily.     . carvedilol (COREG) 25 MG tablet Take 25 mg by mouth 2 (two) times daily with a meal.    . escitalopram (LEXAPRO) 5 MG tablet Take 5 mg by mouth daily.     . famotidine (PEPCID) 20 MG tablet Take 20 mg by mouth daily.    . furosemide (LASIX) 40 MG tablet Take 80 mg by mouth daily.     Marland Kitchen guaiFENesin-dextromethorphan (ROBITUSSIN DM) 100-10 MG/5ML syrup Take 5 mLs by mouth every 6 (six) hours as needed for cough. 118 mL 0  . Iron-FA-B Cmp-C-Biot-Probiotic (FUSION  PLUS PO) Take 1 tablet by mouth daily.     Marland Kitchen lisinopril (ZESTRIL) 10 MG tablet Take 10 mg by mouth daily.     Marland Kitchen loratadine (CLARITIN) 10 MG tablet Take 10 mg by mouth daily.    Marland Kitchen losartan (COZAAR) 25 MG tablet Take 25 mg by mouth daily.    . Methoxy PEG-Epoetin Beta (MIRCERA IJ) Mircera    . omeprazole (PRILOSEC) 20 MG capsule Take 1 capsule (20 mg total) by mouth daily. 30 capsule 0  . RELION INSULIN SYR 0.5ML/31G 31G X 5/16" 0.5 ML MISC USE 1 ONCE DAILY    . rosuvastatin (CRESTOR) 10 MG tablet Take 10 mg by mouth every Monday, Wednesday, and Friday.    . triamcinolone cream (KENALOG) 0.1 % Apply 1 application topically daily as needed (apply to face and Groin area).      No current facility-administered medications on file prior to visit.     Family History  Problem Relation Age of Onset  . Diabetes Mother     BP 140/90 (BP Location: Right Arm, Patient Position: Sitting, Cuff Size: Normal)   Pulse 87   Wt 191 lb 6.4 oz (86.8 kg)   SpO2 98%   BMI 37.38 kg/m    Review of Systems Denies LOC    Objective:   Physical Exam VITAL SIGNS:  See vs page GENERAL: no distress Pulses: dorsalis pedis intact bilat.   MSK: no deformity of the feet CV: no leg edema Skin:  no ulcer on the feet.  normal color and temp on the feet. Neuro: sensation is intact to touch on the feet  A1c=10.6%     Assessment & Plan:  Insulin-requiring type 2 DM, with DR: worse Hypoglycemia: this limits aggressiveness of glycemic control.  We can't increase insulin now.  HTN: is noted today  Patient Instructions  Your blood pressure is high today.  Please see your primary care provider soon, to have it rechecked check your blood sugar twice a day.  vary the time of day when you check, between before the 3 meals, and at bedtime.  also check if you have symptoms of your blood sugar being too high or too low.  please keep a record of the readings and bring it to your next appointment here (or you can bring the  meter itself).  You can write it on any piece of paper.  please call us sooner if your blood sugar goes below 70, or if you have a lot of readings over 200. Please continue  the same insulin for now.  On this type of insulin schedule, you should eat meals on a regular schedule.  If a meal is missed or significantly delayed, your blood sugar could go low. Getting rid of the lows is the next step in your diabetes care.   Please come back for a follow-up appointment in 1 month.

## 2020-04-05 NOTE — Patient Instructions (Addendum)
Your blood pressure is high today.  Please see your primary care provider soon, to have it rechecked check your blood sugar twice a day.  vary the time of day when you check, between before the 3 meals, and at bedtime.  also check if you have symptoms of your blood sugar being too high or too low.  please keep a record of the readings and bring it to your next appointment here (or you can bring the meter itself).  You can write it on any piece of paper.  please call us sooner if your blood sugar goes below 70, or if you have a lot of readings over 200. Please continue the same insulin for now.  On this type of insulin schedule, you should eat meals on a regular schedule.  If a meal is missed or significantly delayed, your blood sugar could go low. Getting rid of the lows is the next step in your diabetes care.   Please come back for a follow-up appointment in 1 month.

## 2020-04-21 ENCOUNTER — Other Ambulatory Visit: Payer: Self-pay | Admitting: *Deleted

## 2020-04-21 DIAGNOSIS — N186 End stage renal disease: Secondary | ICD-10-CM

## 2020-04-27 ENCOUNTER — Telehealth (HOSPITAL_COMMUNITY): Payer: Self-pay

## 2020-04-27 NOTE — Telephone Encounter (Signed)

## 2020-04-28 ENCOUNTER — Ambulatory Visit: Payer: Medicare Other | Admitting: Vascular Surgery

## 2020-04-28 ENCOUNTER — Ambulatory Visit (HOSPITAL_COMMUNITY)
Admission: RE | Admit: 2020-04-28 | Discharge: 2020-04-28 | Disposition: A | Discharge status: Home / Self Care | Payer: Medicare Other | Source: Ambulatory Visit | Attending: Vascular Surgery | Admitting: Vascular Surgery

## 2020-04-28 ENCOUNTER — Ambulatory Visit (INDEPENDENT_AMBULATORY_CARE_PROVIDER_SITE_OTHER)
Admission: RE | Admit: 2020-04-28 | Discharge: 2020-04-28 | Disposition: A | Discharge status: Home / Self Care | Payer: Medicare Other | Source: Ambulatory Visit | Attending: Vascular Surgery | Admitting: Vascular Surgery

## 2020-04-28 ENCOUNTER — Other Ambulatory Visit: Payer: Self-pay

## 2020-04-28 ENCOUNTER — Encounter: Payer: Self-pay | Admitting: Vascular Surgery

## 2020-04-28 VITALS — BP 195/106 | HR 84 | Temp 98.1°F | Resp 20 | Ht 60.0 in | Wt 196.0 lb

## 2020-04-28 DIAGNOSIS — N186 End stage renal disease: Secondary | ICD-10-CM | POA: Insufficient documentation

## 2020-04-28 NOTE — Progress Notes (Signed)
REASON FOR CONSULT:    To evaluate for hemodialysis access.  The consult is requested by Dr. Otelia Santee.   ASSESSMENT & PLAN:   END-STAGE RENAL DISEASE: This patient's left brachiocephalic fistula is no longer salvageable at this point.  She has a functioning catheter.  I think her next option for a fistula would be a basilic vein transposition in the left arm.  I explained that this would have to be done in 2 stages.  If the vein is not mature adequately for a second stage basilic vein transposition then the options would be a radiocephalic or brachiocephalic fistula on the right versus an AV graft in the left arm.  I have discussed the indications for the for stage basilic vein transposition and the potential complications.  All of their questions were answered and she is agreeable to proceed.  Her surgery is scheduled for 05/18/2019 which gives her some time to get her blood pressure under better control before surgery.  HYPERTENSION: The patient's initial blood pressure today was elevated. We repeated this and this was still elevated. We have encouraged the patient to follow up with their primary care physician for management of their blood pressure.  I have explained that if she comes in for surgery and her blood pressure is this high that her surgery would have to be canceled until her blood pressure was under better control.   Deitra Mayo, MD Office: [phone]   HPI:   Katie Garcia is a pleasant 79 y.o. female, who was sent for evaluation for hemodialysis access.  The patient had a left brachiocephalic fistula placed in October 2017.  Patient has had multiple interventions on this most recently on 03/15/2020.  The patient had thrombectomy and placement of a tunneled dialysis catheter.  She is sent for evaluation for new access.  Of note the patient is right-handed.  She dialyzes on Tuesdays Thursdays and Saturdays.  She had a left brachiocephalic fistula placed in October 2017.   She tells me this has been worked on twice.  After the most recent attempted thrombectomy this could not be salvaged and the catheter was placed.  She denies any recent uremic symptoms.  Specifically, she denies nausea, vomiting, fatigue, anorexia, or palpitations.  She does not have a pacemaker.  She is not on anticoagulation.  Past Medical History:  Diagnosis Date  . Anemia   . Arthritis   . Chronic kidney disease   . Diabetes mellitus without complication ([place])   . GERD (gastroesophageal reflux disease)   . Headache   . Hypertension     Family History  Problem Relation Age of Onset  . Diabetes Mother     SOCIAL HISTORY: Social History   Socioeconomic History  . Marital status: Married    Spouse name: Not on file  . Number of children: Not on file  . Years of education: Not on file  . Highest education level: Not on file  Occupational History  . Not on file  Tobacco Use  . Smoking status: Never Smoker  . Smokeless tobacco: Never Used  Substance and Sexual Activity  . Alcohol use: No  . Drug use: No  . Sexual activity: Not on file  Other Topics Concern  . Not on file  Social History Narrative  . Not on file   Social Determinants of Health   Financial Resource Strain:   . Difficulty of Paying Living Expenses:   Food Insecurity:   . Worried About [employer] of  Food in the Last Year:   . [place] in the Last Year:   Transportation Needs:   . Film/video editor (Medical):   Marland Kitchen Lack of Transportation (Non-Medical):   Physical Activity:   . Days of Exercise per Week:   . Minutes of Exercise per Session:   Stress:   . Feeling of Stress :   Social Connections:   . Frequency of Communication with Friends and Family:   . Frequency of Social Gatherings with Friends and Family:   . Attends Religious Services:   . Active Member of Clubs or Organizations:   . Attends Archivist Meetings:   Marland Kitchen Marital Status:   Intimate Partner Violence:   .  Fear of Current or Ex-Partner:   . Emotionally Abused:   Marland Kitchen Physically Abused:   . Sexually Abused:     Allergies  Allergen Reactions  . Penicillins Rash    Did it involve swelling of the face/tongue/throat, SOB, or low BP? No Did it involve sudden or severe rash/hives, skin peeling, or any reaction on the inside of your mouth or nose? Yes Did you need to seek medical attention at a hospital or doctor's office? N/A When did it last happen?N/A If all above answers are "NO", may proceed with cephalosporin use.    Current Outpatient Medications  Medication Sig Dispense Refill  . ACCU-CHEK GUIDE test strip USE TO CHECK BG UP TO 4 TIMES A DAY (DX: E11.22 - DIABETES WITH CKD AND INSULIN THERAPY) IN VITRO  1  . allopurinol (ZYLOPRIM) 100 MG tablet Take 200 mg by mouth daily.     Marland Kitchen aspirin 81 MG tablet Take 81 mg by mouth daily.    . B Complex-C-Zn-Folic Acid (DIALYVITE/ZINC) TABS Take 1 tablet by mouth every evening.   3  . calcitRIOL (ROCALTROL) 0.25 MCG capsule Take 0.25 mcg by mouth daily.    . calcium acetate (PHOSLO) 667 MG capsule Take 667 mg by mouth 3 (three) times daily with meals.   3  . Calcium Carbonate Antacid (TUMS E-X PO) Take 1 tablet by mouth daily.     . carvedilol (COREG) 25 MG tablet Take 25 mg by mouth 2 (two) times daily with a meal.    . escitalopram (LEXAPRO) 5 MG tablet Take 5 mg by mouth daily.     . famotidine (PEPCID) 20 MG tablet Take 20 mg by mouth daily.    . furosemide (LASIX) 40 MG tablet Take 80 mg by mouth daily.     Marland Kitchen guaiFENesin-dextromethorphan (ROBITUSSIN DM) 100-10 MG/5ML syrup Take 5 mLs by mouth every 6 (six) hours as needed for cough. 118 mL 0  . insulin NPH-regular Human (HUMULIN 70/30) (70-30) 100 UNIT/ML injection Inject 26 Units into the skin daily with breakfast. Inject 26 Units into the skin daily with breakfast. 10 mL 11  . Iron-FA-B Cmp-C-Biot-Probiotic (FUSION PLUS PO) Take 1 tablet by mouth daily.     Marland Kitchen lisinopril (ZESTRIL) 10 MG  tablet Take 10 mg by mouth daily.     Marland Kitchen loratadine (CLARITIN) 10 MG tablet Take 10 mg by mouth daily.    Marland Kitchen losartan (COZAAR) 25 MG tablet Take 25 mg by mouth daily.    . Methoxy PEG-Epoetin Beta (MIRCERA IJ) Mircera    . omeprazole (PRILOSEC) 20 MG capsule Take 1 capsule (20 mg total) by mouth daily. 30 capsule 0  . RELION INSULIN SYR 0.5ML/31G 31G X 5/16" 0.5 ML MISC USE 1 ONCE DAILY    .  rosuvastatin (CRESTOR) 10 MG tablet Take 10 mg by mouth every Monday, Wednesday, and Friday.    . triamcinolone cream (KENALOG) 0.1 % Apply 1 application topically daily as needed (apply to face and Groin area).      No current facility-administered medications for this visit.    REVIEW OF SYSTEMS:  [X]  denotes positive finding, [ ]  denotes negative finding Cardiac  Comments:  Chest pain or chest pressure:    Shortness of breath upon exertion:    Short of breath when lying flat:    Irregular heart rhythm:        Vascular    Pain in calf, thigh, or hip brought on by ambulation:    Pain in feet at night that wakes you up from your sleep:     Blood clot in your veins:    Leg swelling:         Pulmonary    Oxygen at home:    Productive cough:     Wheezing:         Neurologic    Sudden weakness in arms or legs:     Sudden numbness in arms or legs:     Sudden onset of difficulty speaking or slurred speech:    Temporary loss of vision in one eye:     Problems with dizziness:         Gastrointestinal    Blood in stool:     Vomited blood:         Genitourinary    Burning when urinating:     Blood in urine:        Psychiatric    Major depression:         Hematologic    Bleeding problems:    Problems with blood clotting too easily:        Skin    Rashes or ulcers:        Constitutional    Fever or chills:     PHYSICAL EXAM:   Vitals:   04/28/20 1254  BP: (!) 195/106  Pulse: 84  Resp: 20  Temp: 98.1 F (36.7 C)  SpO2: 97%  Weight: 196 lb (88.9 kg)  Height: 5' (1.524 m)     GENERAL: The patient is a well-nourished female, in no acute distress. The vital signs are documented above. CARDIAC: There is a regular rate and rhythm.  VASCULAR: I do not detect carotid bruits. She has a palpable brachial and radial pulse bilaterally. PULMONARY: There is good air exchange bilaterally without wheezing or rales. ABDOMEN: Soft and non-tender with normal pitched bowel sounds.  MUSCULOSKELETAL: There are no major deformities or cyanosis. NEUROLOGIC: No focal weakness or paresthesias are detected. SKIN: There are no ulcers or rashes noted. PSYCHIATRIC: The patient has a normal affect.  DATA:    UPPER EXTREMITY VEIN MAP: I have independently interpreted her upper extremity vein map.  On the right side the forearm and upper arm cephalic vein looked reasonable in size.  The basilic vein is somewhat small.  On the left side the basilic vein looks reasonable in size.  She has had a previous left brachiocephalic fistula which is occluded.  UPPER EXTREMITY ARTERIAL DUPLEX: I have independently interpreted her upper extremity arterial duplex scan.  On the right side there is a triphasic radial and ulnar waveform.  Brachial artery measures 0.46 cm in diameter.  On the left side there is a triphasic radial and ulnar waveform.  The brachial artery measures 0.65 cm in diameter.  LABS: The most recent labs I find in epic were from 12/20/2019.  At that time she had a GFR of 5.

## 2020-04-28 NOTE — H&P (View-Only) (Signed)
REASON FOR CONSULT:    To evaluate for hemodialysis access.  The consult is requested by Dr. Otelia Santee.   ASSESSMENT & PLAN:   END-STAGE RENAL DISEASE: This patient's left brachiocephalic fistula is no longer salvageable at this point.  She has a functioning catheter.  I think her next option for a fistula would be a basilic vein transposition in the left arm.  I explained that this would have to be done in 2 stages.  If the vein is not mature adequately for a second stage basilic vein transposition then the options would be a radiocephalic or brachiocephalic fistula on the right versus an AV graft in the left arm.  I have discussed the indications for the for stage basilic vein transposition and the potential complications.  All of their questions were answered and she is agreeable to proceed.  Her surgery is scheduled for 05/18/2019 which gives her some time to get her blood pressure under better control before surgery.  HYPERTENSION: The patient's initial blood pressure today was elevated. We repeated this and this was still elevated. We have encouraged the patient to follow up with their primary care physician for management of their blood pressure.  I have explained that if she comes in for surgery and her blood pressure is this high that her surgery would have to be canceled until her blood pressure was under better control.   Deitra Mayo, MD Office: [phone]   HPI:   Katie Garcia is a pleasant 79 y.o. female, who was sent for evaluation for hemodialysis access.  The patient had a left brachiocephalic fistula placed in October 2017.  Patient has had multiple interventions on this most recently on 03/15/2020.  The patient had thrombectomy and placement of a tunneled dialysis catheter.  She is sent for evaluation for new access.  Of note the patient is right-handed.  She dialyzes on Tuesdays Thursdays and Saturdays.  She had a left brachiocephalic fistula placed in October 2017.   She tells me this has been worked on twice.  After the most recent attempted thrombectomy this could not be salvaged and the catheter was placed.  She denies any recent uremic symptoms.  Specifically, she denies nausea, vomiting, fatigue, anorexia, or palpitations.  She does not have a pacemaker.  She is not on anticoagulation.  Past Medical History:  Diagnosis Date  . Anemia   . Arthritis   . Chronic kidney disease   . Diabetes mellitus without complication ([place])   . GERD (gastroesophageal reflux disease)   . Headache   . Hypertension     Family History  Problem Relation Age of Onset  . Diabetes Mother     SOCIAL HISTORY: Social History   Socioeconomic History  . Marital status: Married    Spouse name: Not on file  . Number of children: Not on file  . Years of education: Not on file  . Highest education level: Not on file  Occupational History  . Not on file  Tobacco Use  . Smoking status: Never Smoker  . Smokeless tobacco: Never Used  Substance and Sexual Activity  . Alcohol use: No  . Drug use: No  . Sexual activity: Not on file  Other Topics Concern  . Not on file  Social History Narrative  . Not on file   Social Determinants of Health   Financial Resource Strain:   . Difficulty of Paying Living Expenses:   Food Insecurity:   . Worried About [employer] of  Food in the Last Year:   . [place] in the Last Year:   Transportation Needs:   . Film/video editor (Medical):   Marland Kitchen Lack of Transportation (Non-Medical):   Physical Activity:   . Days of Exercise per Week:   . Minutes of Exercise per Session:   Stress:   . Feeling of Stress :   Social Connections:   . Frequency of Communication with Friends and Family:   . Frequency of Social Gatherings with Friends and Family:   . Attends Religious Services:   . Active Member of Clubs or Organizations:   . Attends Archivist Meetings:   Marland Kitchen Marital Status:   Intimate Partner Violence:   .  Fear of Current or Ex-Partner:   . Emotionally Abused:   Marland Kitchen Physically Abused:   . Sexually Abused:     Allergies  Allergen Reactions  . Penicillins Rash    Did it involve swelling of the face/tongue/throat, SOB, or low BP? No Did it involve sudden or severe rash/hives, skin peeling, or any reaction on the inside of your mouth or nose? Yes Did you need to seek medical attention at a hospital or doctor's office? N/A When did it last happen?N/A If all above answers are "NO", may proceed with cephalosporin use.    Current Outpatient Medications  Medication Sig Dispense Refill  . ACCU-CHEK GUIDE test strip USE TO CHECK BG UP TO 4 TIMES A DAY (DX: E11.22 - DIABETES WITH CKD AND INSULIN THERAPY) IN VITRO  1  . allopurinol (ZYLOPRIM) 100 MG tablet Take 200 mg by mouth daily.     Marland Kitchen aspirin 81 MG tablet Take 81 mg by mouth daily.    . B Complex-C-Zn-Folic Acid (DIALYVITE/ZINC) TABS Take 1 tablet by mouth every evening.   3  . calcitRIOL (ROCALTROL) 0.25 MCG capsule Take 0.25 mcg by mouth daily.    . calcium acetate (PHOSLO) 667 MG capsule Take 667 mg by mouth 3 (three) times daily with meals.   3  . Calcium Carbonate Antacid (TUMS E-X PO) Take 1 tablet by mouth daily.     . carvedilol (COREG) 25 MG tablet Take 25 mg by mouth 2 (two) times daily with a meal.    . escitalopram (LEXAPRO) 5 MG tablet Take 5 mg by mouth daily.     . famotidine (PEPCID) 20 MG tablet Take 20 mg by mouth daily.    . furosemide (LASIX) 40 MG tablet Take 80 mg by mouth daily.     Marland Kitchen guaiFENesin-dextromethorphan (ROBITUSSIN DM) 100-10 MG/5ML syrup Take 5 mLs by mouth every 6 (six) hours as needed for cough. 118 mL 0  . insulin NPH-regular Human (HUMULIN 70/30) (70-30) 100 UNIT/ML injection Inject 26 Units into the skin daily with breakfast. Inject 26 Units into the skin daily with breakfast. 10 mL 11  . Iron-FA-B Cmp-C-Biot-Probiotic (FUSION PLUS PO) Take 1 tablet by mouth daily.     Marland Kitchen lisinopril (ZESTRIL) 10 MG  tablet Take 10 mg by mouth daily.     Marland Kitchen loratadine (CLARITIN) 10 MG tablet Take 10 mg by mouth daily.    Marland Kitchen losartan (COZAAR) 25 MG tablet Take 25 mg by mouth daily.    . Methoxy PEG-Epoetin Beta (MIRCERA IJ) Mircera    . omeprazole (PRILOSEC) 20 MG capsule Take 1 capsule (20 mg total) by mouth daily. 30 capsule 0  . RELION INSULIN SYR 0.5ML/31G 31G X 5/16" 0.5 ML MISC USE 1 ONCE DAILY    .  rosuvastatin (CRESTOR) 10 MG tablet Take 10 mg by mouth every Monday, Wednesday, and Friday.    . triamcinolone cream (KENALOG) 0.1 % Apply 1 application topically daily as needed (apply to face and Groin area).      No current facility-administered medications for this visit.    REVIEW OF SYSTEMS:  [X]  denotes positive finding, [ ]  denotes negative finding Cardiac  Comments:  Chest pain or chest pressure:    Shortness of breath upon exertion:    Short of breath when lying flat:    Irregular heart rhythm:        Vascular    Pain in calf, thigh, or hip brought on by ambulation:    Pain in feet at night that wakes you up from your sleep:     Blood clot in your veins:    Leg swelling:         Pulmonary    Oxygen at home:    Productive cough:     Wheezing:         Neurologic    Sudden weakness in arms or legs:     Sudden numbness in arms or legs:     Sudden onset of difficulty speaking or slurred speech:    Temporary loss of vision in one eye:     Problems with dizziness:         Gastrointestinal    Blood in stool:     Vomited blood:         Genitourinary    Burning when urinating:     Blood in urine:        Psychiatric    Major depression:         Hematologic    Bleeding problems:    Problems with blood clotting too easily:        Skin    Rashes or ulcers:        Constitutional    Fever or chills:     PHYSICAL EXAM:   Vitals:   04/28/20 1254  BP: (!) 195/106  Pulse: 84  Resp: 20  Temp: 98.1 F (36.7 C)  SpO2: 97%  Weight: 196 lb (88.9 kg)  Height: 5' (1.524 m)     GENERAL: The patient is a well-nourished female, in no acute distress. The vital signs are documented above. CARDIAC: There is a regular rate and rhythm.  VASCULAR: I do not detect carotid bruits. She has a palpable brachial and radial pulse bilaterally. PULMONARY: There is good air exchange bilaterally without wheezing or rales. ABDOMEN: Soft and non-tender with normal pitched bowel sounds.  MUSCULOSKELETAL: There are no major deformities or cyanosis. NEUROLOGIC: No focal weakness or paresthesias are detected. SKIN: There are no ulcers or rashes noted. PSYCHIATRIC: The patient has a normal affect.  DATA:    UPPER EXTREMITY VEIN MAP: I have independently interpreted her upper extremity vein map.  On the right side the forearm and upper arm cephalic vein looked reasonable in size.  The basilic vein is somewhat small.  On the left side the basilic vein looks reasonable in size.  She has had a previous left brachiocephalic fistula which is occluded.  UPPER EXTREMITY ARTERIAL DUPLEX: I have independently interpreted her upper extremity arterial duplex scan.  On the right side there is a triphasic radial and ulnar waveform.  Brachial artery measures 0.46 cm in diameter.  On the left side there is a triphasic radial and ulnar waveform.  The brachial artery measures 0.65 cm in diameter.  LABS: The most recent labs I find in epic were from 12/20/2019.  At that time she had a GFR of 5.

## 2020-05-07 ENCOUNTER — Other Ambulatory Visit: Payer: Self-pay

## 2020-05-10 ENCOUNTER — Other Ambulatory Visit: Payer: Self-pay

## 2020-05-10 ENCOUNTER — Encounter: Payer: Self-pay | Admitting: Endocrinology

## 2020-05-10 ENCOUNTER — Ambulatory Visit (INDEPENDENT_AMBULATORY_CARE_PROVIDER_SITE_OTHER): Payer: Medicare Other | Admitting: Endocrinology

## 2020-05-10 VITALS — BP 134/68 | HR 88 | Ht 60.0 in | Wt 195.0 lb

## 2020-05-10 DIAGNOSIS — IMO0002 Reserved for concepts with insufficient information to code with codable children: Secondary | ICD-10-CM

## 2020-05-10 DIAGNOSIS — E1365 Other specified diabetes mellitus with hyperglycemia: Secondary | ICD-10-CM

## 2020-05-10 LAB — POCT GLYCOSYLATED HEMOGLOBIN (HGB A1C): Hemoglobin A1C: 11.1 % — AB (ref 4.0–5.6)

## 2020-05-10 MED ORDER — HUMULIN 70/30 (70-30) 100 UNIT/ML ~~LOC~~ SUSP: 32.0000 [IU] | Freq: Every day | SUBCUTANEOUS | 11 refills | Status: DC

## 2020-05-10 NOTE — Progress Notes (Signed)
Subjective:    Patient ID: Katie Garcia, female    DOB: [birth year], 79 y.o.   MRN: [id number]  HPI Pt returns for f/u of diabetes mellitus:  DM type: Insulin-requiring type 2 Dx'ed: 0388 Complications: PN, DR, and ESRD (on HD).  Therapy: insulin since 2004.   GDM: 1970 DKA: never.   Severe hypoglycemia: last episode was 2017.    Pancreatitis: never.   Pancreatic imaging: never.  SDOH: she takes human insulin, due to cost.  On HD days (morning), she takes insulin with breakfast when she gets home.  Other: she takes QD insulin, after poor results with multiple daily injections; she was changed to 70/30 qam, due to pattern of cbg's; fructosamine has confirmed A1c.   Interval history: she brings her meter with her cbg's which I have reviewed today.  cbg varies from 189-509.  It is in general lowest after lunch is delayed pt states she feels well in general.  Pt says she never misses the insulin.  No recent steroids.  Past Medical History:  Diagnosis Date  . Anemia   . Arthritis   . Chronic kidney disease   . Diabetes mellitus without complication ([place])   . GERD (gastroesophageal reflux disease)   . Headache   . Hypertension     Past Surgical History:  Procedure Laterality Date  . ABDOMINAL HYSTERECTOMY    . AV FISTULA PLACEMENT Left 10/02/2016   Procedure: ARTERIOVENOUS (AV) FISTULA CREATION LEFT UPPER ARM;  Surgeon: Waynetta Sandy, MD;  Location: [facility];  Service: Vascular;  Laterality: Left;  . CESAREAN SECTION      Social History   Socioeconomic History  . Marital status: Married    Spouse name: Not on file  . Number of children: Not on file  . Years of education: Not on file  . Highest education level: Not on file  Occupational History  . Not on file  Tobacco Use  . Smoking status: Never Smoker  . Smokeless tobacco: Never Used  Substance and Sexual Activity  . Alcohol use: No  . Drug use: No  . Sexual activity: Not on file  Other Topics Concern  . Not  on file  Social History Narrative  . Not on file   Social Determinants of Health   Financial Resource Strain:   . Difficulty of Paying Living Expenses:   Food Insecurity:   . Worried About [employer] in the Last Year:   . Arboriculturist in the Last Year:   Transportation Needs:   . Film/video editor (Medical):   Marland Kitchen Lack of Transportation (Non-Medical):   Physical Activity:   . Days of Exercise per Week:   . Minutes of Exercise per Session:   Stress:   . Feeling of Stress :   Social Connections:   . Frequency of Communication with Friends and Family:   . Frequency of Social Gatherings with Friends and Family:   . Attends Religious Services:   . Active Member of Clubs or Organizations:   . Attends Archivist Meetings:   Marland Kitchen Marital Status:   Intimate Partner Violence:   . Fear of Current or Ex-Partner:   . Emotionally Abused:   Marland Kitchen Physically Abused:   . Sexually Abused:     Current Outpatient Medications on File Prior to Visit  Medication Sig Dispense Refill  . ACCU-CHEK GUIDE test strip USE TO CHECK BG UP TO 4 TIMES A DAY (DX: E11.22 - DIABETES WITH CKD  AND INSULIN THERAPY) IN VITRO  1  . acetaminophen (TYLENOL) 500 MG tablet Take 1,000 mg by mouth every 6 (six) hours as needed for moderate pain or headache.    . allopurinol (ZYLOPRIM) 100 MG tablet Take 100 mg by mouth every other day.     . B Complex-C-Zn-Folic Acid (DIALYVITE/ZINC) TABS Take 1 tablet by mouth every evening.   3  . calcium acetate (PHOSLO) 667 MG capsule Take 1,334 mg by mouth 3 (three) times daily with meals.   3  . Calcium Carbonate Antacid (TUMS E-X PO) Take 2 tablets by mouth daily as needed (acid reflux).     . carvedilol (COREG) 25 MG tablet Take 25 mg by mouth 2 (two) times daily with a meal.    . dextromethorphan-guaiFENesin (MUCINEX DM) 30-600 MG 12hr tablet Take 1 tablet by mouth 2 (two) times daily as needed for cough.    . famotidine (PEPCID) 20 MG tablet Take 20 mg by  mouth daily.    Marland Kitchen loratadine (CLARITIN) 10 MG tablet Take 10 mg by mouth daily.    Marland Kitchen losartan (COZAAR) 50 MG tablet Take 50 mg by mouth See admin instructions. Take 50 twice daily on Mon, Wed, Fri and Sunday    . Methoxy PEG-Epoetin Beta (MIRCERA IJ) Mircera    . Olopatadine HCl (PATADAY OP) Place 1 drop into both eyes daily as needed (allergies).    Marland Kitchen omeprazole (PRILOSEC) 20 MG capsule Take 1 capsule (20 mg total) by mouth daily. 30 capsule 0  . RELION INSULIN SYR 0.5ML/31G 31G X 5/16" 0.5 ML MISC USE 1 ONCE DAILY    . rosuvastatin (CRESTOR) 10 MG tablet Take 10 mg by mouth daily.     Marland Kitchen triamcinolone cream (KENALOG) 0.1 % Apply 1 application topically daily as needed (rash).      No current facility-administered medications on file prior to visit.    Allergies  Allergen Reactions  . Penicillins Rash    Did it involve swelling of the face/tongue/throat, SOB, or low BP? No Did it involve sudden or severe rash/hives, skin peeling, or any reaction on the inside of your mouth or nose? Yes Did you need to seek medical attention at a hospital or doctor's office? N/A When did it last happen?N/A If all above answers are "NO", may proceed with cephalosporin use.    Family History  Problem Relation Age of Onset  . Diabetes Mother     BP 134/68   Pulse 88   Ht 5' (1.524 m)   Wt 195 lb (88.5 kg)   SpO2 98%   BMI 38.08 kg/m    Review of Systems She denies hypoglycemia    Objective:   Physical Exam VITAL SIGNS:  See vs page GENERAL: no distress Pulses: dorsalis pedis intact bilat.   MSK: no deformity of the feet CV: trace bilat leg edema Skin:  no ulcer on the feet.  normal color and temp on the feet. Neuro: sensation is intact to touch on the feet  Lab Results  Component Value Date   HGBA1C 11.1 (A) 05/10/2020        Assessment & Plan:  Insulin-requiring type 2 DM: much worse.  Renal failure: in this context, she needs premixed insulin QAM only.  Patient  Instructions  check your blood sugar twice a day.  vary the time of day when you check, between before the 3 meals, and at bedtime.  also check if you have symptoms of your blood sugar being too high  or too low.  please keep a record of the readings and bring it to your next appointment here (or you can bring the meter itself).  You can write it on any piece of paper.  please call us sooner if your blood sugar goes below 70, or if you have a lot of readings over 200. Please continue the same insulin for now.  On this type of insulin schedule, you should eat meals on a regular schedule.  If a meal is missed or significantly delayed, your blood sugar could go low.  I have sent a prescription to your pharmacy, to increase the insulin to 32 units with breakfast.   Please come back for a follow-up appointment in 6 weeks.

## 2020-05-10 NOTE — Patient Instructions (Addendum)
check your blood sugar twice a day.  vary the time of day when you check, between before the 3 meals, and at bedtime.  also check if you have symptoms of your blood sugar being too high or too low.  please keep a record of the readings and bring it to your next appointment here (or you can bring the meter itself).  You can write it on any piece of paper.  please call us sooner if your blood sugar goes below 70, or if you have a lot of readings over 200. Please continue the same insulin for now.  On this type of insulin schedule, you should eat meals on a regular schedule.  If a meal is missed or significantly delayed, your blood sugar could go low.  I have sent a prescription to your pharmacy, to increase the insulin to 32 units with breakfast.   Please come back for a follow-up appointment in 6 weeks.

## 2020-05-12 ENCOUNTER — Ambulatory Visit: Payer: Medicare Other | Admitting: Podiatry

## 2020-05-12 ENCOUNTER — Encounter: Payer: Self-pay | Admitting: Podiatry

## 2020-05-12 ENCOUNTER — Other Ambulatory Visit: Payer: Self-pay

## 2020-05-12 DIAGNOSIS — M79674 Pain in right toe(s): Secondary | ICD-10-CM | POA: Diagnosis not present

## 2020-05-12 DIAGNOSIS — M79675 Pain in left toe(s): Secondary | ICD-10-CM

## 2020-05-12 DIAGNOSIS — B351 Tinea unguium: Secondary | ICD-10-CM | POA: Diagnosis not present

## 2020-05-12 DIAGNOSIS — L84 Corns and callosities: Secondary | ICD-10-CM

## 2020-05-12 DIAGNOSIS — E1122 Type 2 diabetes mellitus with diabetic chronic kidney disease: Secondary | ICD-10-CM

## 2020-05-12 DIAGNOSIS — N186 End stage renal disease: Secondary | ICD-10-CM | POA: Diagnosis not present

## 2020-05-12 NOTE — Patient Instructions (Signed)
Diabetes Mellitus and Foot Care Foot care is an important part of your health, especially when you have diabetes. Diabetes may cause you to have problems because of poor blood flow (circulation) to your feet and legs, which can cause your skin to:  Become thinner and drier.  Break more easily.  Heal more slowly.  Peel and crack. You may also have nerve damage (neuropathy) in your legs and feet, causing decreased feeling in them. This means that you may not notice minor injuries to your feet that could lead to more serious problems. Noticing and addressing any potential problems early is the best way to prevent future foot problems. How to care for your feet Foot hygiene  Wash your feet daily with warm water and mild soap. Do not use hot water. Then, pat your feet and the areas between your toes until they are completely dry. Do not soak your feet as this can dry your skin.  Trim your toenails straight across. Do not dig under them or around the cuticle. File the edges of your nails with an emery board or nail file.  Apply a moisturizing lotion or petroleum jelly to the skin on your feet and to dry, brittle toenails. Use lotion that does not contain alcohol and is unscented. Do not apply lotion between your toes. Shoes and socks  Wear clean socks or stockings every day. Make sure they are not too tight. Do not wear knee-high stockings since they may decrease blood flow to your legs.  Wear shoes that fit properly and have enough cushioning. Always look in your shoes before you put them on to be sure there are no objects inside.  To break in new shoes, wear them for just a few hours a day. This prevents injuries on your feet. Wounds, scrapes, corns, and calluses  Check your feet daily for blisters, cuts, bruises, sores, and redness. If you cannot see the bottom of your feet, use a mirror or ask someone for help.  Do not cut corns or calluses or try to remove them with medicine.  If you  find a minor scrape, cut, or break in the skin on your feet, keep it and the skin around it clean and dry. You may clean these areas with mild soap and water. Do not clean the area with peroxide, alcohol, or iodine.  If you have a wound, scrape, corn, or callus on your foot, look at it several times a day to make sure it is healing and not infected. Check for: ? Redness, swelling, or pain. ? Fluid or blood. ? Warmth. ? Pus or a bad smell. General instructions  Do not cross your legs. This may decrease blood flow to your feet.  Do not use heating pads or hot water bottles on your feet. They may burn your skin. If you have lost feeling in your feet or legs, you may not know this is happening until it is too late.  Protect your feet from hot and cold by wearing shoes, such as at the beach or on hot pavement.  Schedule a complete foot exam at least once a year (annually) or more often if you have foot problems. If you have foot problems, report any cuts, sores, or bruises to your health care provider immediately. Contact a health care provider if:  You have a medical condition that increases your risk of infection and you have any cuts, sores, or bruises on your feet.  You have an injury that is not   healing.  You have redness on your legs or feet.  You feel burning or tingling in your legs or feet.  You have pain or cramps in your legs and feet.  Your legs or feet are numb.  Your feet always feel cold.  You have pain around a toenail. Get help right away if:  You have a wound, scrape, corn, or callus on your foot and: ? You have pain, swelling, or redness that gets worse. ? You have fluid or blood coming from the wound, scrape, corn, or callus. ? Your wound, scrape, corn, or callus feels warm to the touch. ? You have pus or a bad smell coming from the wound, scrape, corn, or callus. ? You have a fever. ? You have a red line going up your leg. Summary  Check your feet every day  for cuts, sores, red spots, swelling, and blisters.  Moisturize feet and legs daily.  Wear shoes that fit properly and have enough cushioning.  If you have foot problems, report any cuts, sores, or bruises to your health care provider immediately.  Schedule a complete foot exam at least once a year (annually) or more often if you have foot problems. This information is not intended to replace advice given to you by your health care provider. Make sure you discuss any questions you have with your health care provider. Document Revised: 09/03/2019 Document Reviewed: 01/12/2017 Elsevier Patient Education  2020 Elsevier Inc.  

## 2020-05-14 ENCOUNTER — Encounter (HOSPITAL_COMMUNITY): Payer: Self-pay | Admitting: Vascular Surgery

## 2020-05-14 ENCOUNTER — Other Ambulatory Visit: Payer: Self-pay

## 2020-05-14 ENCOUNTER — Other Ambulatory Visit (HOSPITAL_COMMUNITY)
Admission: RE | Admit: 2020-05-14 | Discharge: 2020-05-14 | Disposition: A | Discharge status: Home / Self Care | Payer: Medicare Other | Source: Ambulatory Visit | Attending: Vascular Surgery | Admitting: Vascular Surgery

## 2020-05-14 DIAGNOSIS — Z20822 Contact with and (suspected) exposure to covid-19: Secondary | ICD-10-CM | POA: Diagnosis not present

## 2020-05-14 DIAGNOSIS — Z01812 Encounter for preprocedural laboratory examination: Secondary | ICD-10-CM | POA: Diagnosis present

## 2020-05-14 LAB — SARS CORONAVIRUS 2 (TAT 6-24 HRS): SARS Coronavirus 2: NEGATIVE

## 2020-05-14 NOTE — Anesthesia Preprocedure Evaluation (Addendum)
Anesthesia Evaluation  Patient identified by MRN, date of birth, ID band Patient awake    Reviewed: Allergy & Precautions, NPO status , Patient's Chart, lab work & pertinent test results  History of Anesthesia Complications Negative for: history of anesthetic complications  Airway Mallampati: I  TM Distance: >3 FB Neck ROM: Full    Dental  (+) Edentulous Upper, Edentulous Lower, Dental Advisory Given   Pulmonary neg pulmonary ROS,    Pulmonary exam normal        Cardiovascular hypertension, Pt. on medications and Pt. on home beta blockers Normal cardiovascular exam  Echo 04/11/18: Study Conclusions  - Left ventricle: The cavity size was normal. Wall thickness was  normal. Systolic function was normal. The estimated ejection  fraction was in the range of 55% to 60%. Wall motion was normal;  there were no regional wall motion abnormalities. Features are  consistent with a pseudonormal left ventricular filling pattern,  with concomitant abnormal relaxation and increased filling  pressure (grade 2 diastolic dysfunction).  - Aortic valve: There was no stenosis.  - Mitral valve: There was trivial regurgitation.  - Right ventricle: The cavity size was normal. Systolic function  was normal.  - Tricuspid valve: Peak RV-RA gradient (S): 34 mm Hg.  - Pulmonary arteries: PA peak pressure: 37 mm Hg (S).  - Inferior vena cava: The vessel was normal in size. The  respirophasic diameter changes were in the normal range (>= 50%),  consistent with normal central venous pressure.  Impressions:  - Normal LV size and systolic function. EF 55-60%. Moderate  diastolic dysfunction. Normal RV size and systolic function. Mild  pulmonary hypertension.    Neuro/Psych    GI/Hepatic Neg liver ROS, GERD  ,  Endo/Other  diabetes  Renal/GU ESRFRenal disease     Musculoskeletal   Abdominal   Peds  Hematology  (+) anemia  ,   Anesthesia Other Findings   Reproductive/Obstetrics                            Anesthesia Physical Anesthesia Plan  ASA: III  Anesthesia Plan: MAC   Post-op Pain Management:    Induction: Intravenous  PONV Risk Score and Plan: 2 and Ondansetron and Propofol infusion  Airway Management Planned: Natural Airway and Simple Face Mask  Additional Equipment:   Intra-op Plan:   Post-operative Plan:   Informed Consent: I have reviewed the patients History and Physical, chart, labs and discussed the procedure including the risks, benefits and alternatives for the proposed anesthesia with the patient or authorized representative who has indicated his/her understanding and acceptance.     Dental advisory given  Plan Discussed with: CRNA and Anesthesiologist  Anesthesia Plan Comments: (PAT note written 05/14/2020 by Myra Gianotti, PA-C. )       Anesthesia Quick Evaluation

## 2020-05-14 NOTE — Progress Notes (Signed)
Spoke with pt for pre-op call. Pt has hx of HTN and Type 2 diabetes. Denies cardiac history. Pt's most recent A1C was 11.1 on 05/10/20 at Dr. Cordelia Pen office, he increased her Humulin 70/30 insulin from 30 units to 32 units. Pt states she is taking it as prescribed. States her fasting blood sugar is running between 200-300. Instructed pt to check her blood sugar when she gets up Monday AM and every 2 hours until she leaves for the hospital. If blood sugar is 70 or below, treat with 1/2 cup of clear juice (apple or cranberry) and recheck blood sugar 15 minutes after drinking juice. If blood sugar continues to be 70 or below, call the Short Stay department and ask to speak to a nurse. Pt voiced understanding. At this point in our conversation, pt asked that I talk to her daughter Lelon Frohlich and give her the instructions. Ann called me shortly after I hung up from the patient. I gave her all the pre-op instructions.  Covid test done today. Pt states she is in quarantine and has been since having the test done. She will go to dialysis tomorrow and wear mask, wash hands and social distance, then go back into quarantine until she comes to the hospital on Monday.  Chart sent to Anesthesia PA.

## 2020-05-14 NOTE — Progress Notes (Signed)
Anesthesia Chart Review: Katie Garcia   Case: [id number] Date/Time: 05/17/20 0915   Procedure: LEFT FIRST STAGE BASILIC VEIN TRANSPOSITION (Left )   Anesthesia type: Monitor Anesthesia Care   Pre-op diagnosis: END STAGE RENAL DISEASE   Location: [place] OR ROOM 16 / [place] [facility]   Surgeons: Angelia Mould, MD      DISCUSSION: Patient is a 79 year old female scheduled for the above procedure.  Her previous AV fistula was no longer salvageable.  She is a functioning tunneled dialysis catheter.  History includes never smoker, DM2, HTN, GERD, anemia, ESRD, obesity. + COVID 12/16/19.  Insulin dosage increased on 05/10/2020 by Dr. Loanne Drilling for A1c of 11.1%.  05/14/2020 presurgical COVID-19 test is in process.  Anesthesia team to evaluate on the day of surgery.   VS: For day of surgery. Last 3 BP/HR in CHL below:  Wt Readings from Last 3 Encounters:  05/10/20 88.5 kg  04/28/20 88.9 kg  04/05/20 86.8 kg   BP Readings from Last 3 Encounters:  05/10/20 134/68  04/28/20 (!) 195/106  04/05/20 140/90   Pulse Readings from Last 3 Encounters:  05/10/20 88  04/28/20 84  04/05/20 87    PROVIDERS: [name], MD his PCP Renato Shin, MD is endocrinologist   LABS: For day of surgery.  A1c on 05/10/20 was 11.1% and insulin increased by her endocrinologist.   IMAGES: CT perfusion brain/CTA head and neck 12/16/19 (during admission for COVID-19): IMPRESSION: 1. CT perfusion demonstrates no acute infarct or significant ischemia. 2. No emergent large vessel occlusion. 3. Absence of the right A1 segment is likely due to a hypoplastic or chronically occluded vessel. Anterior communicating artery and both A2 segments are normal. 4. Atherosclerotic changes are noted at the left carotid bifurcation and cavernous internal carotid arteries bilaterally without significant stenosis. 5. Hypoplastic left vertebral artery terminates at the PICA. 6. Mild narrowing of the proximal left  PCA. 7. Mild diffuse distal small vessel disease without significant proximal stenosis or occlusion elsewhere within the [place].   EKG: 12/16/19: NSR   CV: Echo 04/11/18: Study Conclusions  - Left ventricle: The cavity size was normal. Wall thickness was  normal. Systolic function was normal. The estimated ejection  fraction was in the range of 55% to 60%. Wall motion was normal;  there were no regional wall motion abnormalities. Features are  consistent with a pseudonormal left ventricular filling pattern,  with concomitant abnormal relaxation and increased filling  pressure (grade 2 diastolic dysfunction).  - Aortic valve: There was no stenosis.  - Mitral valve: There was trivial regurgitation.  - Right ventricle: The cavity size was normal. Systolic function  was normal.  - Tricuspid valve: Peak RV-RA gradient (S): 34 mm Hg.  - Pulmonary arteries: PA peak pressure: 37 mm Hg (S).  - Inferior vena cava: The vessel was normal in size. The  respirophasic diameter changes were in the normal range (>= 50%),  consistent with normal central venous pressure.  Impressions:  - Normal LV size and systolic function. EF 55-60%. Moderate  diastolic dysfunction. Normal RV size and systolic function. Mild  pulmonary hypertension.    Past Medical History:  Diagnosis Date  . Anemia   . Arthritis   . Chronic kidney disease    Dialysis T/Th/Sa  . Diabetes mellitus without complication ([place])   . GERD (gastroesophageal reflux disease)   . Headache   . Hypertension     Past Surgical History:  Procedure Laterality Date  . ABDOMINAL HYSTERECTOMY    .  AV FISTULA PLACEMENT Left 10/02/2016   Procedure: ARTERIOVENOUS (AV) FISTULA CREATION LEFT UPPER ARM;  Surgeon: Waynetta Sandy, MD;  Location: [place];  Service: Vascular;  Laterality: Left;  . CESAREAN SECTION      MEDICATIONS: No current facility-administered medications for this encounter.   Marland Kitchen  acetaminophen (TYLENOL) 500 MG tablet  . allopurinol (ZYLOPRIM) 100 MG tablet  . B Complex-C-Zn-Folic Acid (DIALYVITE/ZINC) TABS  . calcium acetate (PHOSLO) 667 MG capsule  . Calcium Carbonate Antacid (TUMS E-X PO)  . carvedilol (COREG) 25 MG tablet  . dextromethorphan-guaiFENesin (MUCINEX DM) 30-600 MG 12hr tablet  . famotidine (PEPCID) 20 MG tablet  . loratadine (CLARITIN) 10 MG tablet  . losartan (COZAAR) 50 MG tablet  . Olopatadine HCl (PATADAY OP)  . rosuvastatin (CRESTOR) 10 MG tablet  . triamcinolone cream (KENALOG) 0.1 %  . ACCU-CHEK GUIDE test strip  . insulin NPH-regular Human (HUMULIN 70/30) (70-30) 100 UNIT/ML injection  . Methoxy PEG-Epoetin Beta (MIRCERA IJ)  . omeprazole (PRILOSEC) 20 MG capsule  . RELION INSULIN SYR 0.5ML/31G 31G X 5/16" 0.5 ML MISC     Myra Gianotti, PA-C Surgical Short Stay/Anesthesiology [facility] Phone [phone] [facility] Phone [phone] 05/14/2020 4:16 PM

## 2020-05-17 ENCOUNTER — Other Ambulatory Visit: Payer: Self-pay

## 2020-05-17 ENCOUNTER — Ambulatory Visit (HOSPITAL_COMMUNITY): Payer: Medicare Other | Admitting: Vascular Surgery

## 2020-05-17 ENCOUNTER — Encounter (HOSPITAL_COMMUNITY)
Admission: RE | Disposition: A | Discharge status: Home / Self Care | Payer: Self-pay | Source: Home / Self Care | Attending: Vascular Surgery

## 2020-05-17 ENCOUNTER — Ambulatory Visit (HOSPITAL_COMMUNITY)
Admission: RE | Admit: 2020-05-17 | Discharge: 2020-05-17 | Disposition: A | Discharge status: Home / Self Care | Payer: Medicare Other | Attending: Vascular Surgery | Admitting: Vascular Surgery

## 2020-05-17 ENCOUNTER — Encounter (HOSPITAL_COMMUNITY): Payer: Self-pay | Admitting: Vascular Surgery

## 2020-05-17 DIAGNOSIS — D631 Anemia in chronic kidney disease: Secondary | ICD-10-CM | POA: Insufficient documentation

## 2020-05-17 DIAGNOSIS — I6782 Cerebral ischemia: Secondary | ICD-10-CM | POA: Insufficient documentation

## 2020-05-17 DIAGNOSIS — I120 Hypertensive chronic kidney disease with stage 5 chronic kidney disease or end stage renal disease: Secondary | ICD-10-CM | POA: Insufficient documentation

## 2020-05-17 DIAGNOSIS — M1990 Unspecified osteoarthritis, unspecified site: Secondary | ICD-10-CM | POA: Insufficient documentation

## 2020-05-17 DIAGNOSIS — I272 Pulmonary hypertension, unspecified: Secondary | ICD-10-CM | POA: Insufficient documentation

## 2020-05-17 DIAGNOSIS — Z992 Dependence on renal dialysis: Secondary | ICD-10-CM | POA: Insufficient documentation

## 2020-05-17 DIAGNOSIS — K219 Gastro-esophageal reflux disease without esophagitis: Secondary | ICD-10-CM | POA: Diagnosis not present

## 2020-05-17 DIAGNOSIS — Z79899 Other long term (current) drug therapy: Secondary | ICD-10-CM | POA: Insufficient documentation

## 2020-05-17 DIAGNOSIS — E1122 Type 2 diabetes mellitus with diabetic chronic kidney disease: Secondary | ICD-10-CM | POA: Insufficient documentation

## 2020-05-17 DIAGNOSIS — Z880 Allergy status to penicillin: Secondary | ICD-10-CM | POA: Diagnosis not present

## 2020-05-17 DIAGNOSIS — Z833 Family history of diabetes mellitus: Secondary | ICD-10-CM | POA: Insufficient documentation

## 2020-05-17 DIAGNOSIS — Z7982 Long term (current) use of aspirin: Secondary | ICD-10-CM | POA: Diagnosis not present

## 2020-05-17 DIAGNOSIS — Z794 Long term (current) use of insulin: Secondary | ICD-10-CM | POA: Diagnosis not present

## 2020-05-17 DIAGNOSIS — N186 End stage renal disease: Secondary | ICD-10-CM | POA: Insufficient documentation

## 2020-05-17 DIAGNOSIS — N185 Chronic kidney disease, stage 5: Secondary | ICD-10-CM | POA: Diagnosis not present

## 2020-05-17 HISTORY — PX: BASCILIC VEIN TRANSPOSITION: SHX5742

## 2020-05-17 LAB — GLUCOSE, CAPILLARY
Glucose-Capillary: 302 mg/dL — ABNORMAL HIGH (ref 70–99)
Glucose-Capillary: 277 mg/dL — ABNORMAL HIGH (ref 70–99)
Glucose-Capillary: 259 mg/dL — ABNORMAL HIGH (ref 70–99)
Glucose-Capillary: 218 mg/dL — ABNORMAL HIGH (ref 70–99)

## 2020-05-17 LAB — POCT I-STAT, CHEM 8
BUN: 87 mg/dL — ABNORMAL HIGH (ref 8–23)
Calcium, Ion: 1.07 mmol/L — ABNORMAL LOW (ref 1.15–1.40)
Chloride: 100 mmol/L (ref 98–111)
Creatinine, Ser: 10.5 mg/dL — ABNORMAL HIGH (ref 0.44–1.00)
Glucose, Bld: 301 mg/dL — ABNORMAL HIGH (ref 70–99)
HCT: 38 % (ref 36.0–46.0)
Hemoglobin: 12.9 g/dL (ref 12.0–15.0)
Potassium: 5.4 mmol/L — ABNORMAL HIGH (ref 3.5–5.1)
Sodium: 133 mmol/L — ABNORMAL LOW (ref 135–145)
TCO2: 27 mmol/L (ref 22–32)

## 2020-05-17 SURGERY — TRANSPOSITION, VEIN, BASILIC
Anesthesia: Monitor Anesthesia Care | Site: Arm Upper | Laterality: Left

## 2020-05-17 MED ORDER — LIDOCAINE HCL (PF) 1 % IJ SOLN
INTRAMUSCULAR | Status: AC
  Filled 2020-05-17: qty 30

## 2020-05-17 MED ORDER — LABETALOL HCL 5 MG/ML IV SOLN
INTRAVENOUS | Status: AC
  Administered 2020-05-17: 5 mg via INTRAVENOUS
  Filled 2020-05-17: qty 4

## 2020-05-17 MED ORDER — PHENYLEPHRINE 40 MCG/ML (10ML) SYRINGE FOR IV PUSH (FOR BLOOD PRESSURE SUPPORT)
PREFILLED_SYRINGE | INTRAVENOUS | Status: AC
  Filled 2020-05-17: qty 10

## 2020-05-17 MED ORDER — FENTANYL CITRATE (PF) 250 MCG/5ML IJ SOLN
INTRAMUSCULAR | Status: AC
  Filled 2020-05-17: qty 5

## 2020-05-17 MED ORDER — PROPOFOL 500 MG/50ML IV EMUL
INTRAVENOUS | Status: DC | PRN
  Administered 2020-05-17: 50 ug/kg/min via INTRAVENOUS

## 2020-05-17 MED ORDER — INSULIN ASPART 100 UNIT/ML ~~LOC~~ SOLN
SUBCUTANEOUS | Status: AC
  Filled 2020-05-17: qty 1

## 2020-05-17 MED ORDER — LIDOCAINE-EPINEPHRINE (PF) 1 %-1:200000 IJ SOLN
INTRAMUSCULAR | Status: AC
  Filled 2020-05-17: qty 30

## 2020-05-17 MED ORDER — SODIUM CHLORIDE 0.9 % IV SOLN
INTRAVENOUS | Status: AC
  Filled 2020-05-17: qty 1.2

## 2020-05-17 MED ORDER — PROPOFOL 1000 MG/100ML IV EMUL
INTRAVENOUS | Status: AC
  Filled 2020-05-17: qty 100

## 2020-05-17 MED ORDER — LABETALOL HCL 5 MG/ML IV SOLN
5.0000 mg | INTRAVENOUS | Status: DC | PRN
  Administered 2020-05-17: 5 mg via INTRAVENOUS

## 2020-05-17 MED ORDER — PHENYLEPHRINE 40 MCG/ML (10ML) SYRINGE FOR IV PUSH (FOR BLOOD PRESSURE SUPPORT)
PREFILLED_SYRINGE | INTRAVENOUS | Status: DC | PRN
  Administered 2020-05-17 (×5): 80 ug via INTRAVENOUS

## 2020-05-17 MED ORDER — PROTAMINE SULFATE 10 MG/ML IV SOLN
INTRAVENOUS | Status: DC | PRN
Start: 2020-05-17 — End: 2020-05-17
  Administered 2020-05-17: 20 mg via INTRAVENOUS
  Administered 2020-05-17: 10 mg via INTRAVENOUS
  Administered 2020-05-17: 20 mg via INTRAVENOUS

## 2020-05-17 MED ORDER — VANCOMYCIN HCL IN DEXTROSE 1-5 GM/200ML-% IV SOLN
1000.0000 mg | INTRAVENOUS | Status: AC
  Administered 2020-05-17: 1000 mg via INTRAVENOUS
  Filled 2020-05-17 (×2): qty 200

## 2020-05-17 MED ORDER — PROTAMINE SULFATE 10 MG/ML IV SOLN
INTRAVENOUS | Status: AC
  Filled 2020-05-17: qty 5

## 2020-05-17 MED ORDER — ONDANSETRON HCL 4 MG/2ML IJ SOLN
INTRAMUSCULAR | Status: DC | PRN
  Administered 2020-05-17: 4 mg via INTRAVENOUS

## 2020-05-17 MED ORDER — CHLORHEXIDINE GLUCONATE 0.12 % MT SOLN
15.0000 mL | Freq: Once | OROMUCOSAL | Status: AC
  Administered 2020-05-17: 15 mL via OROMUCOSAL
  Filled 2020-05-17: qty 15

## 2020-05-17 MED ORDER — ACETAMINOPHEN 500 MG PO TABS
1000.0000 mg | ORAL_TABLET | Freq: Once | ORAL | Status: AC
  Administered 2020-05-17: 1000 mg via ORAL
  Filled 2020-05-17: qty 2

## 2020-05-17 MED ORDER — LIDOCAINE-EPINEPHRINE (PF) 1 %-1:200000 IJ SOLN
INTRAMUSCULAR | Status: DC | PRN
  Administered 2020-05-17: 25 mL

## 2020-05-17 MED ORDER — 0.9 % SODIUM CHLORIDE (POUR BTL) OPTIME
TOPICAL | Status: DC | PRN
  Administered 2020-05-17: 1000 mL

## 2020-05-17 MED ORDER — LABETALOL HCL 5 MG/ML IV SOLN
5.0000 mg | Freq: Once | INTRAVENOUS | Status: AC
  Administered 2020-05-17: 5 mg via INTRAVENOUS

## 2020-05-17 MED ORDER — EPHEDRINE 5 MG/ML INJ
INTRAVENOUS | Status: AC
  Filled 2020-05-17: qty 10

## 2020-05-17 MED ORDER — ONDANSETRON HCL 4 MG/2ML IJ SOLN
INTRAMUSCULAR | Status: AC
  Filled 2020-05-17: qty 2

## 2020-05-17 MED ORDER — ORAL CARE MOUTH RINSE: 15.0000 mL | Freq: Once | OROMUCOSAL | Status: AC

## 2020-05-17 MED ORDER — HEPARIN SODIUM (PORCINE) 1000 UNIT/ML IJ SOLN
INTRAMUSCULAR | Status: AC
  Filled 2020-05-17: qty 1

## 2020-05-17 MED ORDER — INSULIN ASPART 100 UNIT/ML ~~LOC~~ SOLN
10.0000 [IU] | Freq: Once | SUBCUTANEOUS | Status: AC
  Administered 2020-05-17: 10 [IU] via SUBCUTANEOUS

## 2020-05-17 MED ORDER — SODIUM CHLORIDE 0.9 % IV SOLN
INTRAVENOUS | Status: DC | PRN
  Administered 2020-05-17: 500 mL

## 2020-05-17 MED ORDER — FENTANYL CITRATE (PF) 100 MCG/2ML IJ SOLN: 25.0000 ug | INTRAMUSCULAR | Status: DC | PRN

## 2020-05-17 MED ORDER — SODIUM CHLORIDE 0.9 % IV SOLN: INTRAVENOUS | Status: DC

## 2020-05-17 MED ORDER — CHLORHEXIDINE GLUCONATE 4 % EX LIQD: 60.0000 mL | Freq: Once | CUTANEOUS | Status: DC

## 2020-05-17 MED ORDER — PHENYLEPHRINE HCL-NACL 10-0.9 MG/250ML-% IV SOLN
INTRAVENOUS | Status: DC | PRN
  Administered 2020-05-17: 25 ug/min via INTRAVENOUS

## 2020-05-17 MED ORDER — CELECOXIB 200 MG PO CAPS
200.0000 mg | ORAL_CAPSULE | Freq: Once | ORAL | Status: DC
  Filled 2020-05-17: qty 1

## 2020-05-17 MED ORDER — PROMETHAZINE HCL 25 MG/ML IJ SOLN: 6.2500 mg | INTRAMUSCULAR | Status: DC | PRN

## 2020-05-17 MED ORDER — FENTANYL CITRATE (PF) 100 MCG/2ML IJ SOLN
INTRAMUSCULAR | Status: DC | PRN
  Administered 2020-05-17 (×2): 25 ug via INTRAVENOUS

## 2020-05-17 MED ORDER — EPHEDRINE SULFATE-NACL 50-0.9 MG/10ML-% IV SOSY
PREFILLED_SYRINGE | INTRAVENOUS | Status: DC | PRN
  Administered 2020-05-17 (×2): 5 mg via INTRAVENOUS
  Administered 2020-05-17: 10 mg via INTRAVENOUS

## 2020-05-17 MED ORDER — OXYCODONE HCL 5 MG PO TABS: 5.0000 mg | ORAL_TABLET | ORAL | 0 refills | Status: DC | PRN

## 2020-05-17 MED ORDER — HEPARIN SODIUM (PORCINE) 1000 UNIT/ML IJ SOLN
INTRAMUSCULAR | Status: DC | PRN
  Administered 2020-05-17: 8000 [IU] via INTRAVENOUS

## 2020-05-17 SURGICAL SUPPLY — 39 items
ARMBAND PINK RESTRICT EXTREMIT (MISCELLANEOUS) ×3 IMPLANT
CANISTER SUCT 3000ML PPV (MISCELLANEOUS) ×3 IMPLANT
CANNULA VESSEL 3MM 2 BLNT TIP (CANNULA) ×3 IMPLANT
CLIP VESOCCLUDE MED 24/CT (CLIP) ×3 IMPLANT
CLIP VESOCCLUDE SM WIDE 24/CT (CLIP) ×5 IMPLANT
CLIP VESOCCLUDE SM WIDE 6/CT (CLIP) ×2 IMPLANT
COVER PROBE W GEL 5X96 (DRAPES) ×2 IMPLANT
COVER WAND RF STERILE (DRAPES) ×1 IMPLANT
DECANTER SPIKE VIAL GLASS SM (MISCELLANEOUS) ×3 IMPLANT
DERMABOND ADVANCED (GAUZE/BANDAGES/DRESSINGS) ×2
DERMABOND ADVANCED .7 DNX12 (GAUZE/BANDAGES/DRESSINGS) ×1 IMPLANT
ELECT REM PT RETURN 9FT ADLT (ELECTROSURGICAL) ×3
ELECTRODE REM PT RTRN 9FT ADLT (ELECTROSURGICAL) ×1 IMPLANT
GLOVE BIO SURGEON STRL SZ7.5 (GLOVE) ×3 IMPLANT
GLOVE BIOGEL PI IND STRL 6.5 (GLOVE) IMPLANT
GLOVE BIOGEL PI IND STRL 8 (GLOVE) ×1 IMPLANT
GLOVE BIOGEL PI INDICATOR 6.5 (GLOVE) ×2
GLOVE BIOGEL PI INDICATOR 8 (GLOVE) ×2
GLOVE SURG SS PI 6.5 STRL IVOR (GLOVE) ×2 IMPLANT
GOWN STRL NON-REIN LRG LVL3 (GOWN DISPOSABLE) ×2 IMPLANT
GOWN STRL REUS W/ TWL LRG LVL3 (GOWN DISPOSABLE) ×3 IMPLANT
GOWN STRL REUS W/TWL LRG LVL3 (GOWN DISPOSABLE) ×9
GRAFT GORETEX STRT 4-7X45 (Vascular Products) ×2 IMPLANT
KIT BASIN OR (CUSTOM PROCEDURE TRAY) ×3 IMPLANT
KIT TURNOVER KIT B (KITS) ×3 IMPLANT
LOOP VESSEL MAXI BLUE (MISCELLANEOUS) ×2 IMPLANT
NS IRRIG 1000ML POUR BTL (IV SOLUTION) ×3 IMPLANT
PACK CV ACCESS (CUSTOM PROCEDURE TRAY) ×3 IMPLANT
PAD ARMBOARD 7.5X6 YLW CONV (MISCELLANEOUS) ×6 IMPLANT
SPONGE LAP 18X18 RF (DISPOSABLE) ×2 IMPLANT
SPONGE SURGIFOAM ABS GEL 100 (HEMOSTASIS) IMPLANT
SUT PROLENE 6 0 BV (SUTURE) ×10 IMPLANT
SUT SILK 2 0 SH (SUTURE) IMPLANT
SUT VIC AB 3-0 SH 27 (SUTURE) ×6
SUT VIC AB 3-0 SH 27X BRD (SUTURE) ×2 IMPLANT
SUT VICRYL 4-0 PS2 18IN ABS (SUTURE) ×6 IMPLANT
TOWEL GREEN STERILE (TOWEL DISPOSABLE) ×3 IMPLANT
UNDERPAD 30X36 HEAVY ABSORB (UNDERPADS AND DIAPERS) ×3 IMPLANT
WATER STERILE IRR 1000ML POUR (IV SOLUTION) ×3 IMPLANT

## 2020-05-17 NOTE — Anesthesia Procedure Notes (Signed)
Procedure Name: MAC Date/Time: 05/17/2020 10:06 AM Performed by: Candis Shine, CRNA Pre-anesthesia Checklist: Patient identified, Emergency Drugs available, Suction available, Patient being monitored and Timeout performed Patient Re-evaluated:Patient Re-evaluated prior to induction Oxygen Delivery Method: Simple face mask Dental Injury: Teeth and Oropharynx as per pre-operative assessment

## 2020-05-17 NOTE — Anesthesia Postprocedure Evaluation (Signed)
Anesthesia Post Note  Patient: Katie Garcia  Procedure(s) Performed: Insertion of Left arm arteriovenous gortex graftarm  (Left Arm Upper)     Patient location during evaluation: PACU Anesthesia Type: MAC Level of consciousness: awake and alert Pain management: pain level controlled Vital Signs Assessment: post-procedure vital signs reviewed and stable Respiratory status: spontaneous breathing and respiratory function stable Cardiovascular status: stable Postop Assessment: no apparent nausea or vomiting Anesthetic complications: no    Last Vitals:  Vitals:   05/17/20 1212 05/17/20 1217  BP: (!) 107/55 (!) 115/57  Pulse: 79 80  Resp: 18 18  Temp: 36.5 C   SpO2: 99% 100%    Last Pain:  Vitals:   05/17/20 1217  TempSrc:   PainSc: 0-No pain                 Gavrielle Streck DANIEL

## 2020-05-17 NOTE — Transfer of Care (Signed)
Immediate Anesthesia Transfer of Care Note  Patient: Katie Garcia  Procedure(s) Performed: Insertion of Left arm arteriovenous gortex graftarm  (Left Arm Upper)  Patient Location: PACU  Anesthesia Type:MAC  Level of Consciousness: awake, alert , oriented and patient cooperative  Airway & Oxygen Therapy: Patient Spontanous Breathing  Post-op Assessment: Report given to RN and Post -op Vital signs reviewed and stable  Post vital signs: Reviewed and stable  Last Vitals:  Vitals Value Taken Time  BP 108/53   Temp    Pulse 82 05/17/20 1133  Resp 17 05/17/20 1133  SpO2 97 % 05/17/20 1133  Vitals shown include unvalidated device data.  Last Pain:  Vitals:   05/17/20 0822  TempSrc:   PainSc: 0-No pain      Patients Stated Pain Goal: 3 (06/00/45 9977)  Complications: No apparent anesthesia complications

## 2020-05-17 NOTE — Op Note (Signed)
    NAME: Katie Garcia    MRN: [id number] DOB: Mar 08, 1941    DATE OF OPERATION: 05/17/2020  PREOP DIAGNOSIS:    End-stage renal disease  POSTOP DIAGNOSIS:    Same  PROCEDURE:    New left upper arm AV graft (4-7 mm PTFE graft)  SURGEON: Judeth Cornfield. Scot Dock, MD  ASSIST: Izetta Dakin, RNFA  ANESTHESIA: Local with sedation  EBL: Minimal  INDICATIONS:    Katie Garcia is a 79 y.o. female who presents for new access.  FINDINGS:   She had failed upper arm brachiocephalic fistula.  Her basilic vein was very small and I did not think this was adequate.  There I therefore placed an upper arm graft.  This was a 4-7 mm PTFE graft  TECHNIQUE:   The patient was taken to the operating room and sedated by anesthesia.  The left upper extremity was prepped and draped in usual sterile fashion.  I looked at the basilic vein myself with the SonoSite and this was not adequate for a basilic vein transposition.  A longitudinal incision was made above the antecubital level after the skin was anesthetized.  Here the brachial artery was dissected free and controlled with a vessel loop.  The adjacent veins were not adequate.  A separate longitudinal incision was made beneath the axilla after the skin was anesthetized.  The high brachial vein was dissected free.  A 4-7 mm PTFE graft was then tunneled in the upper arm and the patient was heparinized.  The brachial artery was clamped proximally and distally and a longitudinal arteriotomy was made.  A segment of the 4 mm end of the graft was excised, the graft slightly spatulated and sewn end-to-side to the brachial artery using continuous 6-0 Prolene suture.  The graft sent for the appropriate length for anastomosis to the high brachial vein.  The vein was ligated distally and spatulated proximally.  The graft was cut to the appropriate length, spatulated and sewn into into the vein using 2 continuous 6-0 Prolene sutures.  At the completion there was an  excellent thrill in the graft.  There was a good radial and ulnar signal with the Doppler.  The heparin was partially reversed with protamine.  Each of the wounds was closed with a deep layer of 3-0 Vicryl and the skin closed with 4-0 Vicryl.  Dermabond was applied.  The patient tolerated the procedure well and was transferred to the recovery room in stable condition.  All needle and sponge counts were correct.  Deitra Mayo, MD, FACS [facility]  DATE OF DICTATION:   05/17/2020

## 2020-05-17 NOTE — Interval H&P Note (Signed)
History and Physical Interval Note:  05/17/2020 9:34 AM  Katie Garcia  has presented today for surgery, with the diagnosis of END STAGE RENAL DISEASE.  The various methods of treatment have been discussed with the patient and family. After consideration of risks, benefits and other options for treatment, the patient has consented to  Procedure(s): LEFT FIRST STAGE BASILIC VEIN TRANSPOSITION (Left) as a surgical intervention.  The patient's history has been reviewed, patient examined, no change in status, stable for surgery.  I have reviewed the patient's chart and labs.  Questions were answered to the patient's satisfaction.     Deitra Mayo

## 2020-05-17 NOTE — Progress Notes (Signed)
Notified Dr. Tobias Alexander of patients blood pressure and blood sugar.  Orders received for blood pressure management.  Will continue to monitor the patient

## 2020-05-19 ENCOUNTER — Other Ambulatory Visit: Payer: Self-pay

## 2020-05-19 ENCOUNTER — Ambulatory Visit (INDEPENDENT_AMBULATORY_CARE_PROVIDER_SITE_OTHER): Payer: Self-pay | Admitting: Physician Assistant

## 2020-05-19 ENCOUNTER — Telehealth: Payer: Self-pay | Admitting: Physician Assistant

## 2020-05-19 ENCOUNTER — Telehealth: Payer: Self-pay

## 2020-05-19 VITALS — BP 154/77 | HR 88 | Temp 97.9°F | Resp 14 | Ht 60.0 in | Wt 193.0 lb

## 2020-05-19 DIAGNOSIS — N186 End stage renal disease: Secondary | ICD-10-CM

## 2020-05-19 NOTE — Telephone Encounter (Signed)
[facility] called to state that patients graft that was placed Monday has no bruit or thrill. Spoke with Dr Scot Dock and patient scheduled to come in and be seen.   Katie Garcia Mindi Junker

## 2020-05-19 NOTE — Progress Notes (Signed)
POST OPERATIVE OFFICE NOTE    CC:  F/u for surgery  HPI:  This is a 79 y.o. female who is s/p left UE graft on 05/17/20 by Dr. Scot Dock.  She was sent here today with a report of no palpable thrill in the graft.  She has HD via [facility].  She denise pain, numbness or loss of motor in the left UE.  She does have edema and erythema along the graft tunnel.  She denise fever or chills.  Allergies  Allergen Reactions  . Penicillins Rash    Did it involve swelling of the face/tongue/throat, SOB, or low BP? No Did it involve sudden or severe rash/hives, skin peeling, or any reaction on the inside of your mouth or nose? Yes Did you need to seek medical attention at a hospital or doctor's office? N/A When did it last happen?N/A If all above answers are "NO", may proceed with cephalosporin use.    Current Outpatient Medications  Medication Sig Dispense Refill  . ACCU-CHEK GUIDE test strip USE TO CHECK BG UP TO 4 TIMES A DAY (DX: E11.22 - DIABETES WITH CKD AND INSULIN THERAPY) IN VITRO  1  . acetaminophen (TYLENOL) 500 MG tablet Take 1,000 mg by mouth every 6 (six) hours as needed for moderate pain or headache.    . allopurinol (ZYLOPRIM) 100 MG tablet Take 100 mg by mouth every other day.     . B Complex-C-Zn-Folic Acid (DIALYVITE/ZINC) TABS Take 1 tablet by mouth every evening.   3  . calcium acetate (PHOSLO) 667 MG capsule Take 1,334 mg by mouth 3 (three) times daily with meals.   3  . Calcium Carbonate Antacid (TUMS E-X PO) Take 2 tablets by mouth daily as needed (acid reflux).     . carvedilol (COREG) 25 MG tablet Take 25 mg by mouth 2 (two) times daily with a meal.    . dextromethorphan-guaiFENesin (MUCINEX DM) 30-600 MG 12hr tablet Take 1 tablet by mouth 2 (two) times daily as needed for cough.    . famotidine (PEPCID) 20 MG tablet Take 20 mg by mouth daily.    . insulin NPH-regular Human (HUMULIN 70/30) (70-30) 100 UNIT/ML injection Inject 32 Units into the skin daily with breakfast. 20  mL 11  . loratadine (CLARITIN) 10 MG tablet Take 10 mg by mouth daily.    Marland Kitchen losartan (COZAAR) 50 MG tablet Take 50 mg by mouth See admin instructions. Take 50 twice daily on Mon, Wed, Fri and Sunday    . Methoxy PEG-Epoetin Beta (MIRCERA IJ) Mircera    . Olopatadine HCl (PATADAY OP) Place 1 drop into both eyes daily as needed (allergies).    Marland Kitchen omeprazole (PRILOSEC) 20 MG capsule Take 1 capsule (20 mg total) by mouth daily. 30 capsule 0  . oxyCODONE (ROXICODONE) 5 MG immediate release tablet Take 1 tablet (5 mg total) by mouth every 4 (four) hours as needed. 15 tablet 0  . RELION INSULIN SYR 0.5ML/31G 31G X 5/16" 0.5 ML MISC USE 1 ONCE DAILY    . rosuvastatin (CRESTOR) 10 MG tablet Take 10 mg by mouth daily.     Marland Kitchen triamcinolone cream (KENALOG) 0.1 % Apply 1 application topically daily as needed (rash).      No current facility-administered medications for this visit.     ROS:  See HPI  Physical Exam:    Incision:  Well healing incisions Extremities:  Mild edema that is soft at the left AC anastomosis site, doppler pulse throughout the graft with  palpable thrill.  Palpable radial pulse, grip 5/5, and sensation to light tough intact left hand.     Assessment/Plan:  This is a 79 y.o. female who is s/p:Placement of left AV graft. She appears to have a working AV graft with "gore reaction erythema and edema.  I will have her f/u in 2 weeks for examination.  Elevation and ice PRN for comfort.    Roxy Horseman PA-C [facility] [phone]  Clinic MD:  Scot Dock

## 2020-05-20 NOTE — Telephone Encounter (Signed)
In error

## 2020-05-20 NOTE — Progress Notes (Signed)
Subjective: Katie Garcia presents today at risk foot care. Pt has h/o NIDDM with chronic kidney disease and callus(es) b/l feet and painful mycotic toenails b/l that are difficult to trim. Pain interferes with ambulation. Aggravating factors include wearing enclosed shoe gear. Pain is relieved with periodic professional debridement.  She voices no new pedal problems on today's visit.  [name], MD is patient's PCP.   Past Medical History:  Diagnosis Date  . Anemia   . Arthritis   . Chronic kidney disease    Dialysis T/Th/Sa  . Diabetes mellitus without complication ([place])   . GERD (gastroesophageal reflux disease)   . Headache   . Hypertension      Current Outpatient Medications on File Prior to Visit  Medication Sig Dispense Refill  . ACCU-CHEK GUIDE test strip USE TO CHECK BG UP TO 4 TIMES A DAY (DX: E11.22 - DIABETES WITH CKD AND INSULIN THERAPY) IN VITRO  1  . acetaminophen (TYLENOL) 500 MG tablet Take 1,000 mg by mouth every 6 (six) hours as needed for moderate pain or headache.    . allopurinol (ZYLOPRIM) 100 MG tablet Take 100 mg by mouth every other day.     . B Complex-C-Zn-Folic Acid (DIALYVITE/ZINC) TABS Take 1 tablet by mouth every evening.   3  . calcium acetate (PHOSLO) 667 MG capsule Take 1,334 mg by mouth 3 (three) times daily with meals.   3  . Calcium Carbonate Antacid (TUMS E-X PO) Take 2 tablets by mouth daily as needed (acid reflux).     . carvedilol (COREG) 25 MG tablet Take 25 mg by mouth 2 (two) times daily with a meal.    . dextromethorphan-guaiFENesin (MUCINEX DM) 30-600 MG 12hr tablet Take 1 tablet by mouth 2 (two) times daily as needed for cough.    . famotidine (PEPCID) 20 MG tablet Take 20 mg by mouth daily.    . insulin NPH-regular Human (HUMULIN 70/30) (70-30) 100 UNIT/ML injection Inject 32 Units into the skin daily with breakfast. 20 mL 11  . loratadine (CLARITIN) 10 MG tablet Take 10 mg by mouth daily.    Marland Kitchen losartan (COZAAR) 50 MG  tablet Take 50 mg by mouth See admin instructions. Take 50 twice daily on Mon, Wed, Fri and Sunday    . Methoxy PEG-Epoetin Beta (MIRCERA IJ) Mircera    . Olopatadine HCl (PATADAY OP) Place 1 drop into both eyes daily as needed (allergies).    Marland Kitchen omeprazole (PRILOSEC) 20 MG capsule Take 1 capsule (20 mg total) by mouth daily. 30 capsule 0  . oxyCODONE (ROXICODONE) 5 MG immediate release tablet Take 1 tablet (5 mg total) by mouth every 4 (four) hours as needed. 15 tablet 0  . RELION INSULIN SYR 0.5ML/31G 31G X 5/16" 0.5 ML MISC USE 1 ONCE DAILY    . rosuvastatin (CRESTOR) 10 MG tablet Take 10 mg by mouth daily.     Marland Kitchen triamcinolone cream (KENALOG) 0.1 % Apply 1 application topically daily as needed (rash).      No current facility-administered medications on file prior to visit.     Allergies  Allergen Reactions  . Penicillins Rash    Did it involve swelling of the face/tongue/throat, SOB, or low BP? No Did it involve sudden or severe rash/hives, skin peeling, or any reaction on the inside of your mouth or nose? Yes Did you need to seek medical attention at a hospital or doctor's office? N/A When did it last happen?N/A If all above answers are "NO",  may proceed with cephalosporin use.    Objective: Katie Garcia is a pleasant 79 y.o. y.o. Patient Race: Black or African American [2]  female in NAD. AAO x 3.  There were no vitals filed for this visit.  Vascular Examination: Neurovascular status unchanged b/l. Capillary fill time to digits <3 seconds b/l. Palpable DP pulses b/l. Faintly palpable PT pulses b/l. Pedal hair absent b/l Skin temperature gradient within normal limits b/l. Nonpitting edema noted b/l LE.  Dermatological Examination: Pedal skin with normal turgor, texture and tone bilaterally. No open wounds bilaterally. No interdigital macerations bilaterally. Toenails 1-5 b/l elongated, dystrophic, thickened, crumbly with subungual debris and tenderness to dorsal  palpation. Hyperkeratotic lesion(s) submet head 5 left foot and submet head 5 right foot.  No erythema, no edema, no drainage, no flocculence.  Musculoskeletal: Normal muscle strength 5/5 to all lower extremity muscle groups bilaterally. No pain crepitus or joint limitation noted with ROM b/l.  Neurological Examination: Protective sensation intact 5/5 intact bilaterally with 10g monofilament b/l. Vibratory sensation intact b/l.  Assessment: No diagnosis found.  Plan: -Examined patient. -No new findings. No new orders. -Continue diabetic foot care principles. Literature dispensed on today.  -Toenails 1-5 b/l were debrided in length and girth with sterile nail nippers and dremel without iatrogenic bleeding.  -Callus(es) submet head 5 left foot and submet head 5 right foot pared utilizing sterile scalpel blade without complication or incident. Total number debrided =2. -Patient to continue soft, supportive shoe gear daily. -Patient to report any pedal injuries to medical professional immediately. -Patient/POA to call should there be question/concern in the interim.  Return in about 3 months (around 08/12/2020) for diabetic nail and callus trim.  Marzetta Board, DPM

## 2020-05-27 DIAGNOSIS — T8579XA Infection and inflammatory reaction due to other internal prosthetic devices, implants and grafts, initial encounter: Secondary | ICD-10-CM | POA: Insufficient documentation

## 2020-06-02 ENCOUNTER — Other Ambulatory Visit: Payer: Self-pay

## 2020-06-02 ENCOUNTER — Ambulatory Visit (INDEPENDENT_AMBULATORY_CARE_PROVIDER_SITE_OTHER): Payer: Self-pay | Admitting: Physician Assistant

## 2020-06-02 VITALS — BP 141/66 | HR 99 | Temp 99.0°F | Resp 18 | Ht 60.0 in | Wt 195.2 lb

## 2020-06-02 DIAGNOSIS — N186 End stage renal disease: Secondary | ICD-10-CM

## 2020-06-02 NOTE — Progress Notes (Signed)
POST OPERATIVE OFFICE NOTE    CC:  F/u for surgery  HPI:  This is a 79 y.o. female who is s/p placement of left upper arm AVG  on 05/17/2020 by Dr. Scot Dock.  Pt was seen on 05/19/2020 and at that time, if was felt she was having a gortex reaction and was instructed to use ice and elevate her arm and follow up in 2 weeks.  She had a palpable left radial pulse and there was a palpable thrill throughout the graft.  She did have some erythema and edema present.  Incisions were healing well.   She comes in today for follow up. She states arm is getting better. Less swelling and tenderness. Improved erythema and warmth. She was started on Antibiotics at dialysis on 6/3. She says she has intermittently been icing and elevating. Pt states she does not have pain/numbness in their left hand.  Fingers stay cool but this has not changed. Incisions are otherwise healing well  The pt is on dialysis via right IJ TDC on Tues/Thurs/ Sat.  Allergies  Allergen Reactions  . Penicillins Rash    Did it involve swelling of the face/tongue/throat, SOB, or low BP? No Did it involve sudden or severe rash/hives, skin peeling, or any reaction on the inside of your mouth or nose? Yes Did you need to seek medical attention at a hospital or doctor's office? N/A When did it last happen?N/A If all above answers are "NO", may proceed with cephalosporin use.    Current Outpatient Medications  Medication Sig Dispense Refill  . ACCU-CHEK GUIDE test strip USE TO CHECK BG UP TO 4 TIMES A DAY (DX: E11.22 - DIABETES WITH CKD AND INSULIN THERAPY) IN VITRO  1  . acetaminophen (TYLENOL) 500 MG tablet Take 1,000 mg by mouth every 6 (six) hours as needed for moderate pain or headache.    . allopurinol (ZYLOPRIM) 100 MG tablet Take 100 mg by mouth every other day.     . B Complex-C-Zn-Folic Acid (DIALYVITE/ZINC) TABS Take 1 tablet by mouth every evening.   3  . calcium acetate (PHOSLO) 667 MG capsule Take 1,334 mg by mouth 3  (three) times daily with meals.   3  . Calcium Carbonate Antacid (TUMS E-X PO) Take 2 tablets by mouth daily as needed (acid reflux).     . carvedilol (COREG) 25 MG tablet Take 25 mg by mouth 2 (two) times daily with a meal.    . dextromethorphan-guaiFENesin (MUCINEX DM) 30-600 MG 12hr tablet Take 1 tablet by mouth 2 (two) times daily as needed for cough.    . famotidine (PEPCID) 20 MG tablet Take 20 mg by mouth daily.    . insulin NPH-regular Human (HUMULIN 70/30) (70-30) 100 UNIT/ML injection Inject 32 Units into the skin daily with breakfast. 20 mL 11  . loratadine (CLARITIN) 10 MG tablet Take 10 mg by mouth daily.    Marland Kitchen losartan (COZAAR) 50 MG tablet Take 50 mg by mouth See admin instructions. Take 50 twice daily on Mon, Wed, Fri and Sunday    . Methoxy PEG-Epoetin Beta (MIRCERA IJ) Mircera    . Olopatadine HCl (PATADAY OP) Place 1 drop into both eyes daily as needed (allergies).    Marland Kitchen omeprazole (PRILOSEC) 20 MG capsule Take 1 capsule (20 mg total) by mouth daily. 30 capsule 0  . oxyCODONE (ROXICODONE) 5 MG immediate release tablet Take 1 tablet (5 mg total) by mouth every 4 (four) hours as needed. 15 tablet 0  .  RELION INSULIN SYR 0.5ML/31G 31G X 5/16" 0.5 ML MISC USE 1 ONCE DAILY    . rosuvastatin (CRESTOR) 10 MG tablet Take 10 mg by mouth daily.     Marland Kitchen triamcinolone cream (KENALOG) 0.1 % Apply 1 application topically daily as needed (rash).      No current facility-administered medications for this visit.     ROS:  See HPI  Physical Exam:  Vitals:   06/02/20 0944  BP: (!) 141/66  Pulse: 99  Resp: 18  Temp: 99 F (37.2 C)  SpO2: 99%   Incision:  Left axillary and distal left upper arm incisions clean, dry and intact. Mild edema around distal incision at [facility] anastomosis site. Mildly erythematous and warm to palpation Extremities: . Difficult to palpate thrill in graft but good doppler signals. Palpable 2+ radial pulse, grip 5/5, motor and sensory intact left hand. Left fingers  cool  Assessment/Plan:  This is a 79 y.o. female who is s/p:left upper arm AVG  on 05/17/2020 by Dr. Scot Dock  -the pt does not have evidence of steal. -the fistula/graft can be used after 06/17/20 -If pt has tunneled dialysis catheter and the access has been used successfully to the satisfaction of the dialysis center, the tunneled catheter can be removed at their discretion.   -continue to elevate left arm - Advised patient to follow up sooner if she is concerned about appearance of her left arm- increased swelling, tenderness, redness or if she develops fever or chills -the pt will follow up in 3 weeks for examination to make sure there is no graft infection but feel that this is likely [place] reaction and will continue to improve  [facility] [phone]  Clinic MD:  Scot Dock

## 2020-06-23 ENCOUNTER — Other Ambulatory Visit: Payer: Self-pay | Admitting: *Deleted

## 2020-06-23 ENCOUNTER — Ambulatory Visit: Payer: Medicare Other | Admitting: Endocrinology

## 2020-06-23 DIAGNOSIS — N186 End stage renal disease: Secondary | ICD-10-CM

## 2020-06-25 ENCOUNTER — Ambulatory Visit (INDEPENDENT_AMBULATORY_CARE_PROVIDER_SITE_OTHER)
Admission: RE | Admit: 2020-06-25 | Discharge: 2020-06-25 | Disposition: A | Discharge status: Home / Self Care | Payer: Medicare Other | Source: Ambulatory Visit | Attending: Vascular Surgery | Admitting: Vascular Surgery

## 2020-06-25 ENCOUNTER — Ambulatory Visit (HOSPITAL_COMMUNITY)
Admission: RE | Admit: 2020-06-25 | Discharge: 2020-06-25 | Disposition: A | Discharge status: Home / Self Care | Payer: Medicare Other | Source: Ambulatory Visit | Attending: Vascular Surgery | Admitting: Vascular Surgery

## 2020-06-25 ENCOUNTER — Other Ambulatory Visit: Payer: Self-pay

## 2020-06-25 ENCOUNTER — Ambulatory Visit: Payer: Medicare Other

## 2020-06-25 ENCOUNTER — Encounter: Payer: Self-pay | Admitting: Vascular Surgery

## 2020-06-25 ENCOUNTER — Ambulatory Visit (INDEPENDENT_AMBULATORY_CARE_PROVIDER_SITE_OTHER): Payer: Self-pay | Admitting: Vascular Surgery

## 2020-06-25 VITALS — BP 139/86 | HR 86 | Temp 97.8°F | Resp 20 | Ht 60.0 in | Wt 191.0 lb

## 2020-06-25 DIAGNOSIS — N186 End stage renal disease: Secondary | ICD-10-CM | POA: Insufficient documentation

## 2020-06-25 NOTE — H&P (View-Only) (Signed)
Patient ID: Katie Garcia, female   DOB: [birth year], 79 y.o.   MRN: [id number]  Reason for Consult: Follow-up   Referred by [name],*  Subjective:     HPI:  Katie Garcia is a 79 y.o. female has end-stage renal disease currently dialyzing via catheter.  She had a left arm cephalic vein fistula that did work for some time and then thrombosed.  She then subsequently had a left arm AV graft that never did work she also had a graft reaction.  This is thrombosed.  She denies any blood thinners.  She does dialyze on Tuesdays Thursdays and Saturdays.  Past Medical History:  Diagnosis Date  . Anemia   . Arthritis   . Chronic kidney disease    Dialysis T/Th/Sa  . Diabetes mellitus without complication ([place])   . GERD (gastroesophageal reflux disease)   . Headache   . Hypertension    Family History  Problem Relation Age of Onset  . Diabetes Mother    Past Surgical History:  Procedure Laterality Date  . ABDOMINAL HYSTERECTOMY    . AV FISTULA PLACEMENT Left 10/02/2016   Procedure: ARTERIOVENOUS (AV) FISTULA CREATION LEFT UPPER ARM;  Surgeon: Waynetta Sandy, MD;  Location: [place];  Service: Vascular;  Laterality: Left;  . BASCILIC VEIN TRANSPOSITION Left 05/17/2020   Procedure: Insertion of Left arm arteriovenous gortex graftarm ;  Surgeon: Angelia Mould, MD;  Location: [facility];  Service: Vascular;  Laterality: Left;  . CESAREAN SECTION      Short Social History:  Social History   Tobacco Use  . Smoking status: Never Smoker  . Smokeless tobacco: Never Used  Substance Use Topics  . Alcohol use: Not Currently    Allergies  Allergen Reactions  . Penicillins Rash    Did it involve swelling of the face/tongue/throat, SOB, or low BP? No Did it involve sudden or severe rash/hives, skin peeling, or any reaction on the inside of your mouth or nose? Yes Did you need to seek medical attention at a hospital or doctor's office? N/A When did it last  happen?N/A If all above answers are "NO", may proceed with cephalosporin use.    Current Outpatient Medications  Medication Sig Dispense Refill  . ACCU-CHEK GUIDE test strip USE TO CHECK BG UP TO 4 TIMES A DAY (DX: E11.22 - DIABETES WITH CKD AND INSULIN THERAPY) IN VITRO  1  . acetaminophen (TYLENOL) 500 MG tablet Take 1,000 mg by mouth every 6 (six) hours as needed for moderate pain or headache.    . allopurinol (ZYLOPRIM) 100 MG tablet Take 100 mg by mouth every other day.     . B Complex-C-Zn-Folic Acid (DIALYVITE/ZINC) TABS Take 1 tablet by mouth every evening.   3  . calcium acetate (PHOSLO) 667 MG capsule Take 1,334 mg by mouth 3 (three) times daily with meals.   3  . Calcium Carbonate Antacid (TUMS E-X PO) Take 2 tablets by mouth daily as needed (acid reflux).     . carvedilol (COREG) 25 MG tablet Take 25 mg by mouth 2 (two) times daily with a meal.    . dextromethorphan-guaiFENesin (MUCINEX DM) 30-600 MG 12hr tablet Take 1 tablet by mouth 2 (two) times daily as needed for cough.    . famotidine (PEPCID) 20 MG tablet Take 20 mg by mouth daily.    . insulin NPH-regular Human (HUMULIN 70/30) (70-30) 100 UNIT/ML injection Inject 32 Units into the skin daily with breakfast. 20 mL 11  .  loratadine (CLARITIN) 10 MG tablet Take 10 mg by mouth daily.    Marland Kitchen losartan (COZAAR) 50 MG tablet Take 50 mg by mouth See admin instructions. Take 50 twice daily on Mon, Wed, Fri and Sunday    . Methoxy PEG-Epoetin Beta (MIRCERA IJ) Mircera    . Olopatadine HCl (PATADAY OP) Place 1 drop into both eyes daily as needed (allergies).    . omeprazole (PRILOSEC) 20 MG capsule Take 1 capsule (20 mg total) by mouth daily. 30 capsule 0  . oxyCODONE (ROXICODONE) 5 MG immediate release tablet Take 1 tablet (5 mg total) by mouth every 4 (four) hours as needed. 15 tablet 0  . RELION INSULIN SYR 0.5ML/31G 31G X 5/16" 0.5 ML MISC USE 1 ONCE DAILY    . rosuvastatin (CRESTOR) 10 MG tablet Take 10 mg by mouth daily.       . triamcinolone cream (KENALOG) 0.1 % Apply 1 application topically daily as needed (rash).      No current facility-administered medications for this visit.    Review of Systems  Constitutional:  Constitutional negative. HENT: HENT negative.  Eyes: Eyes negative.  Respiratory: Respiratory negative.  Cardiovascular: Cardiovascular negative.  GI: Gastrointestinal negative.  Musculoskeletal: Musculoskeletal negative.  Skin: Skin negative.  Neurological: Neurological negative. Hematologic: Hematologic/lymphatic negative.  Psychiatric: Psychiatric negative.        Objective:  Objective   Vitals:   06/25/20 1043  BP: 139/86  Pulse: 86  Resp: 20  Temp: 97.8 F (36.6 C)  SpO2: 98%  Weight: 191 lb (86.6 kg)  Height: 5\' (1.524 m)   Body mass index is 37.3 kg/m.  Physical Exam HENT:     Head: Normocephalic.     Nose:     Comments: Wearing a mask Eyes:     Pupils: Pupils are equal, round, and reactive to light.  Cardiovascular:     Pulses:          Radial pulses are 2+ on the right side and 2+ on the left side.     Comments: Bilateral brachial pulses are 2+ Pulmonary:     Effort: Pulmonary effort is normal.  Abdominal:     General: Abdomen is flat.     Palpations: Abdomen is soft.  Musculoskeletal:        General: Normal range of motion.     Comments: Left upper extremity swelling incisions are clean dry intact there is no thrill in the palpable graft or the palpable fistula in the left upper arm  Skin:    General: Skin is warm.     Capillary Refill: Capillary refill takes less than 2 seconds.  Neurological:     General: No focal deficit present.     Mental Status: She is alert.  Psychiatric:        Mood and Affect: Mood normal.        Behavior: Behavior normal.        Thought Content: Thought content normal.        Judgment: Judgment normal.     Data: I have independently interpreted her bilateral upper extremity duplex.  Bilateral brachial arteries are  triphasic and greater than 0.4 cm bilaterally.  Her bilateral upper extremity vein mapping was interpreted by me.  She has a marginal cephalic vein on the right with unlikely suitable basilic vein.  No suitable vein on the left side.       Assessment/Plan:     79  year old female with end-stage renal disease dialyzing via catheter.  Most  recent access in the left upper extremity is thrombosed and is healing well.  She will need right upper extremity access we will begin with fistula versus more likely graft on a nondialysis day in the near future.     Waynetta Sandy MD [facility]

## 2020-06-25 NOTE — Progress Notes (Signed)
Patient ID: Katie Garcia, female   DOB: 08-Sep-1941, 79 y.o.   MRN: [id number]  Reason for Consult: Follow-up   Referred by [name],*  Subjective:     HPI:  Katie Garcia is a 79 y.o. female has end-stage renal disease currently dialyzing via catheter.  She had a left arm cephalic vein fistula that did work for some time and then thrombosed.  She then subsequently had a left arm AV graft that never did work she also had a graft reaction.  This is thrombosed.  She denies any blood thinners.  She does dialyze on Tuesdays Thursdays and Saturdays.  Past Medical History:  Diagnosis Date  . Anemia   . Arthritis   . Chronic kidney disease    Dialysis T/Th/Sa  . Diabetes mellitus without complication ([place])   . GERD (gastroesophageal reflux disease)   . Headache   . Hypertension    Family History  Problem Relation Age of Onset  . Diabetes Mother    Past Surgical History:  Procedure Laterality Date  . ABDOMINAL HYSTERECTOMY    . AV FISTULA PLACEMENT Left 10/02/2016   Procedure: ARTERIOVENOUS (AV) FISTULA CREATION LEFT UPPER ARM;  Surgeon: Waynetta Sandy, MD;  Location: [place];  Service: Vascular;  Laterality: Left;  . BASCILIC VEIN TRANSPOSITION Left 05/17/2020   Procedure: Insertion of Left arm arteriovenous gortex graftarm ;  Surgeon: Angelia Mould, MD;  Location: [facility] OR;  Service: Vascular;  Laterality: Left;  . CESAREAN SECTION      Short Social History:  Social History   Tobacco Use  . Smoking status: Never Smoker  . Smokeless tobacco: Never Used  Substance Use Topics  . Alcohol use: Not Currently    Allergies  Allergen Reactions  . Penicillins Rash    Did it involve swelling of the face/tongue/throat, SOB, or low BP? No Did it involve sudden or severe rash/hives, skin peeling, or any reaction on the inside of your mouth or nose? Yes Did you need to seek medical attention at a hospital or doctor's office? N/A When did it last  happen?N/A If all above answers are "NO", may proceed with cephalosporin use.    Current Outpatient Medications  Medication Sig Dispense Refill  . ACCU-CHEK GUIDE test strip USE TO CHECK BG UP TO 4 TIMES A DAY (DX: E11.22 - DIABETES WITH CKD AND INSULIN THERAPY) IN VITRO  1  . acetaminophen (TYLENOL) 500 MG tablet Take 1,000 mg by mouth every 6 (six) hours as needed for moderate pain or headache.    . allopurinol (ZYLOPRIM) 100 MG tablet Take 100 mg by mouth every other day.     . B Complex-C-Zn-Folic Acid (DIALYVITE/ZINC) TABS Take 1 tablet by mouth every evening.   3  . calcium acetate (PHOSLO) 667 MG capsule Take 1,334 mg by mouth 3 (three) times daily with meals.   3  . Calcium Carbonate Antacid (TUMS E-X PO) Take 2 tablets by mouth daily as needed (acid reflux).     . carvedilol (COREG) 25 MG tablet Take 25 mg by mouth 2 (two) times daily with a meal.    . dextromethorphan-guaiFENesin (MUCINEX DM) 30-600 MG 12hr tablet Take 1 tablet by mouth 2 (two) times daily as needed for cough.    . famotidine (PEPCID) 20 MG tablet Take 20 mg by mouth daily.    . insulin NPH-regular Human (HUMULIN 70/30) (70-30) 100 UNIT/ML injection Inject 32 Units into the skin daily with breakfast. 20 mL 11  .  loratadine (CLARITIN) 10 MG tablet Take 10 mg by mouth daily.    Marland Kitchen losartan (COZAAR) 50 MG tablet Take 50 mg by mouth See admin instructions. Take 50 twice daily on Mon, Wed, Fri and Sunday    . Methoxy PEG-Epoetin Beta (MIRCERA IJ) Mircera    . Olopatadine HCl (PATADAY OP) Place 1 drop into both eyes daily as needed (allergies).    . omeprazole (PRILOSEC) 20 MG capsule Take 1 capsule (20 mg total) by mouth daily. 30 capsule 0  . oxyCODONE (ROXICODONE) 5 MG immediate release tablet Take 1 tablet (5 mg total) by mouth every 4 (four) hours as needed. 15 tablet 0  . RELION INSULIN SYR 0.5ML/31G 31G X 5/16" 0.5 ML MISC USE 1 ONCE DAILY    . rosuvastatin (CRESTOR) 10 MG tablet Take 10 mg by mouth daily.       . triamcinolone cream (KENALOG) 0.1 % Apply 1 application topically daily as needed (rash).      No current facility-administered medications for this visit.    Review of Systems  Constitutional:  Constitutional negative. HENT: HENT negative.  Eyes: Eyes negative.  Respiratory: Respiratory negative.  Cardiovascular: Cardiovascular negative.  GI: Gastrointestinal negative.  Musculoskeletal: Musculoskeletal negative.  Skin: Skin negative.  Neurological: Neurological negative. Hematologic: Hematologic/lymphatic negative.  Psychiatric: Psychiatric negative.        Objective:  Objective   Vitals:   06/25/20 1043  BP: 139/86  Pulse: 86  Resp: 20  Temp: 97.8 F (36.6 C)  SpO2: 98%  Weight: 191 lb (86.6 kg)  Height: 5\' (1.524 m)   Body mass index is 37.3 kg/m.  Physical Exam HENT:     Head: Normocephalic.     Nose:     Comments: Wearing a mask Eyes:     Pupils: Pupils are equal, round, and reactive to light.  Cardiovascular:     Pulses:          Radial pulses are 2+ on the right side and 2+ on the left side.     Comments: Bilateral brachial pulses are 2+ Pulmonary:     Effort: Pulmonary effort is normal.  Abdominal:     General: Abdomen is flat.     Palpations: Abdomen is soft.  Musculoskeletal:        General: Normal range of motion.     Comments: Left upper extremity swelling incisions are clean dry intact there is no thrill in the palpable graft or the palpable fistula in the left upper arm  Skin:    General: Skin is warm.     Capillary Refill: Capillary refill takes less than 2 seconds.  Neurological:     General: No focal deficit present.     Mental Status: She is alert.  Psychiatric:        Mood and Affect: Mood normal.        Behavior: Behavior normal.        Thought Content: Thought content normal.        Judgment: Judgment normal.     Data: I have independently interpreted her bilateral upper extremity duplex.  Bilateral brachial arteries are  triphasic and greater than 0.4 cm bilaterally.  Her bilateral upper extremity vein mapping was interpreted by me.  She has a marginal cephalic vein on the right with unlikely suitable basilic vein.  No suitable vein on the left side.       Assessment/Plan:     79  year old female with end-stage renal disease dialyzing via catheter.  Most  recent access in the left upper extremity is thrombosed and is healing well.  She will need right upper extremity access we will begin with fistula versus more likely graft on a nondialysis day in the near future.     Waynetta Sandy MD [facility]

## 2020-07-12 ENCOUNTER — Other Ambulatory Visit (HOSPITAL_COMMUNITY)
Admission: RE | Admit: 2020-07-12 | Discharge: 2020-07-12 | Disposition: A | Discharge status: Home / Self Care | Payer: Medicare Other | Source: Ambulatory Visit | Attending: Vascular Surgery | Admitting: Vascular Surgery

## 2020-07-12 DIAGNOSIS — Z01812 Encounter for preprocedural laboratory examination: Secondary | ICD-10-CM | POA: Insufficient documentation

## 2020-07-12 DIAGNOSIS — Z20822 Contact with and (suspected) exposure to covid-19: Secondary | ICD-10-CM | POA: Insufficient documentation

## 2020-07-12 LAB — SARS CORONAVIRUS 2 (TAT 6-24 HRS): SARS Coronavirus 2: NEGATIVE

## 2020-07-13 ENCOUNTER — Encounter (HOSPITAL_COMMUNITY): Payer: Self-pay | Admitting: Vascular Surgery

## 2020-07-13 ENCOUNTER — Other Ambulatory Visit: Payer: Self-pay

## 2020-07-13 NOTE — Progress Notes (Signed)
Spoke with pt's daughter, Katie Garcia for pre-op call. Pt is at dialysis. Pt had surgery here in May, 2021. Belinda states pt has not had any recent chest pain or sob. Pt is a type 2 diabetic. In May her A1C was 11.1, Belinda states she had it done last week at dialysis and it was 9.9. Belinda states pt's fasting blood sugar can be below 70 up to 200. Instructed Belinda to have pt check her blood sugar in the AM when she gets up and every 2 hours until she leaves for the hospital. If blood sugar is >220 take 1/2 of usual correction dose of Novolog insulin. If blood sugar is 70 or below, treat with 1/2 cup of clear juice (apple or cranberry) and recheck blood sugar 15 minutes after drinking juice. Instructed Belinda that pt is not to take her Regular 70/30 insulin in the AM. She voiced understanding.   Covid test done 07/12/20 and it's negative. Katie Garcia states pt has been in quarantine since the test was done except for going to Dialysis today. She understands that pt stays in quarantine until she comes to the hospital tomorrow.

## 2020-07-13 NOTE — Anesthesia Preprocedure Evaluation (Addendum)
Anesthesia Evaluation  Patient identified by MRN, date of birth, ID band Patient awake    Reviewed: Allergy & Precautions, NPO status , Patient's Chart, lab work & pertinent test results, reviewed documented beta blocker date and time   History of Anesthesia Complications Negative for: history of anesthetic complications  Airway Mallampati: II  TM Distance: >3 FB Neck ROM: Full    Dental  (+) Edentulous Upper, Edentulous Lower   Pulmonary neg pulmonary ROS,    Pulmonary exam normal        Cardiovascular hypertension, Pt. on home beta blockers and Pt. on medications Normal cardiovascular exam  TTE 2019: EF 85-46%, grade 2 diastolic dysfunction, PASP 37 mmHg    Neuro/Psych negative neurological ROS  negative psych ROS   GI/Hepatic Neg liver ROS, GERD  Controlled,  Endo/Other  negative endocrine ROSdiabetes, Type 2, Insulin Dependent  Renal/GU ESRF and DialysisRenal disease (HD T/Th/Sa)  negative genitourinary   Musculoskeletal  (+) Arthritis ,   Abdominal   Peds  Hematology negative hematology ROS (+)   Anesthesia Other Findings Day of surgery medications reviewed with patient.  Reproductive/Obstetrics negative OB ROS                           Anesthesia Physical Anesthesia Plan  ASA: III  Anesthesia Plan: MAC   Post-op Pain Management:    Induction:   PONV Risk Score and Plan: 2 and Treatment may vary due to age or medical condition, Ondansetron and Propofol infusion  Airway Management Planned: Natural Airway and Simple Face Mask  Additional Equipment: None  Intra-op Plan:   Post-operative Plan:   Informed Consent: I have reviewed the patients History and Physical, chart, labs and discussed the procedure including the risks, benefits and alternatives for the proposed anesthesia with the patient or authorized representative who has indicated his/her understanding and acceptance.        Plan Discussed with: CRNA  Anesthesia Plan Comments:        Anesthesia Quick Evaluation

## 2020-07-14 ENCOUNTER — Encounter (HOSPITAL_COMMUNITY)
Admission: RE | Disposition: A | Discharge status: Home / Self Care | Payer: Self-pay | Source: Home / Self Care | Attending: Vascular Surgery

## 2020-07-14 ENCOUNTER — Other Ambulatory Visit: Payer: Self-pay

## 2020-07-14 ENCOUNTER — Encounter (HOSPITAL_COMMUNITY): Payer: Self-pay | Admitting: Vascular Surgery

## 2020-07-14 ENCOUNTER — Ambulatory Visit (HOSPITAL_COMMUNITY)
Admission: RE | Admit: 2020-07-14 | Discharge: 2020-07-14 | Disposition: A | Discharge status: Home / Self Care | Payer: Medicare Other | Attending: Vascular Surgery | Admitting: Vascular Surgery

## 2020-07-14 ENCOUNTER — Ambulatory Visit (HOSPITAL_COMMUNITY): Payer: Medicare Other | Admitting: Anesthesiology

## 2020-07-14 ENCOUNTER — Ambulatory Visit: Payer: Medicare Other | Admitting: Endocrinology

## 2020-07-14 DIAGNOSIS — Z79899 Other long term (current) drug therapy: Secondary | ICD-10-CM | POA: Diagnosis not present

## 2020-07-14 DIAGNOSIS — I120 Hypertensive chronic kidney disease with stage 5 chronic kidney disease or end stage renal disease: Secondary | ICD-10-CM | POA: Insufficient documentation

## 2020-07-14 DIAGNOSIS — Z992 Dependence on renal dialysis: Secondary | ICD-10-CM | POA: Insufficient documentation

## 2020-07-14 DIAGNOSIS — E1122 Type 2 diabetes mellitus with diabetic chronic kidney disease: Secondary | ICD-10-CM | POA: Diagnosis not present

## 2020-07-14 DIAGNOSIS — Z794 Long term (current) use of insulin: Secondary | ICD-10-CM | POA: Insufficient documentation

## 2020-07-14 DIAGNOSIS — K219 Gastro-esophageal reflux disease without esophagitis: Secondary | ICD-10-CM | POA: Diagnosis not present

## 2020-07-14 DIAGNOSIS — N186 End stage renal disease: Secondary | ICD-10-CM | POA: Insufficient documentation

## 2020-07-14 DIAGNOSIS — D649 Anemia, unspecified: Secondary | ICD-10-CM | POA: Insufficient documentation

## 2020-07-14 DIAGNOSIS — N185 Chronic kidney disease, stage 5: Secondary | ICD-10-CM | POA: Diagnosis not present

## 2020-07-14 HISTORY — PX: AV FISTULA PLACEMENT: SHX1204

## 2020-07-14 LAB — POCT I-STAT, CHEM 8
BUN: 45 mg/dL — ABNORMAL HIGH (ref 8–23)
Calcium, Ion: 1.07 mmol/L — ABNORMAL LOW (ref 1.15–1.40)
Chloride: 98 mmol/L (ref 98–111)
Creatinine, Ser: 7.4 mg/dL — ABNORMAL HIGH (ref 0.44–1.00)
Glucose, Bld: 200 mg/dL — ABNORMAL HIGH (ref 70–99)
HCT: 38 % (ref 36.0–46.0)
Hemoglobin: 12.9 g/dL (ref 12.0–15.0)
Potassium: 3.4 mmol/L — ABNORMAL LOW (ref 3.5–5.1)
Sodium: 139 mmol/L (ref 135–145)
TCO2: 28 mmol/L (ref 22–32)

## 2020-07-14 LAB — GLUCOSE, CAPILLARY
Glucose-Capillary: 190 mg/dL — ABNORMAL HIGH (ref 70–99)
Glucose-Capillary: 261 mg/dL — ABNORMAL HIGH (ref 70–99)

## 2020-07-14 SURGERY — ARTERIOVENOUS (AV) FISTULA CREATION
Anesthesia: Monitor Anesthesia Care | Site: Arm Upper | Laterality: Right

## 2020-07-14 MED ORDER — VANCOMYCIN HCL IN DEXTROSE 1-5 GM/200ML-% IV SOLN
1000.0000 mg | INTRAVENOUS | Status: AC
  Administered 2020-07-14: 1000 mg via INTRAVENOUS
  Filled 2020-07-14: qty 200

## 2020-07-14 MED ORDER — FENTANYL CITRATE (PF) 100 MCG/2ML IJ SOLN: 25.0000 ug | INTRAMUSCULAR | Status: DC | PRN

## 2020-07-14 MED ORDER — DEXAMETHASONE SODIUM PHOSPHATE 10 MG/ML IJ SOLN
INTRAMUSCULAR | Status: AC
  Filled 2020-07-14: qty 1

## 2020-07-14 MED ORDER — ONDANSETRON HCL 4 MG/2ML IJ SOLN
INTRAMUSCULAR | Status: AC
  Filled 2020-07-14: qty 2

## 2020-07-14 MED ORDER — SODIUM CHLORIDE 0.9 % IV SOLN: INTRAVENOUS | Status: DC | PRN

## 2020-07-14 MED ORDER — PROMETHAZINE HCL 25 MG/ML IJ SOLN: 6.2500 mg | INTRAMUSCULAR | Status: DC | PRN

## 2020-07-14 MED ORDER — CHLORHEXIDINE GLUCONATE 4 % EX LIQD: 60.0000 mL | Freq: Once | CUTANEOUS | Status: DC

## 2020-07-14 MED ORDER — FENTANYL CITRATE (PF) 100 MCG/2ML IJ SOLN
INTRAMUSCULAR | Status: DC | PRN
  Administered 2020-07-14 (×2): 25 ug via INTRAVENOUS

## 2020-07-14 MED ORDER — PROPOFOL 500 MG/50ML IV EMUL
INTRAVENOUS | Status: DC | PRN
  Administered 2020-07-14: 50 ug/kg/min via INTRAVENOUS

## 2020-07-14 MED ORDER — FENTANYL CITRATE (PF) 250 MCG/5ML IJ SOLN
INTRAMUSCULAR | Status: AC
  Filled 2020-07-14: qty 5

## 2020-07-14 MED ORDER — LIDOCAINE-EPINEPHRINE 1 %-1:100000 IJ SOLN
INTRAMUSCULAR | Status: DC | PRN
  Administered 2020-07-14: 20 mL

## 2020-07-14 MED ORDER — HYDROCODONE-ACETAMINOPHEN 5-325 MG PO TABS: 1.0000 | ORAL_TABLET | ORAL | 0 refills | Status: DC | PRN

## 2020-07-14 MED ORDER — LIDOCAINE 2% (20 MG/ML) 5 ML SYRINGE
INTRAMUSCULAR | Status: AC
  Filled 2020-07-14: qty 5

## 2020-07-14 MED ORDER — PROPOFOL 10 MG/ML IV BOLUS
INTRAVENOUS | Status: AC
  Filled 2020-07-14: qty 20

## 2020-07-14 MED ORDER — MIDAZOLAM HCL 2 MG/2ML IJ SOLN
INTRAMUSCULAR | Status: AC
  Filled 2020-07-14: qty 2

## 2020-07-14 MED ORDER — LIDOCAINE-EPINEPHRINE 1 %-1:100000 IJ SOLN
INTRAMUSCULAR | Status: AC
  Filled 2020-07-14: qty 1

## 2020-07-14 MED ORDER — ACETAMINOPHEN 500 MG PO TABS
1000.0000 mg | ORAL_TABLET | Freq: Once | ORAL | Status: AC
  Administered 2020-07-14: 1000 mg via ORAL
  Filled 2020-07-14: qty 2

## 2020-07-14 MED ORDER — SODIUM CHLORIDE 0.9 % IV SOLN: INTRAVENOUS | Status: DC

## 2020-07-14 MED ORDER — SODIUM CHLORIDE 0.9 % IV SOLN
INTRAVENOUS | Status: AC
  Filled 2020-07-14: qty 1.2

## 2020-07-14 MED ORDER — ONDANSETRON HCL 4 MG/2ML IJ SOLN
INTRAMUSCULAR | Status: DC | PRN
  Administered 2020-07-14: 4 mg via INTRAVENOUS

## 2020-07-14 MED ORDER — PHENYLEPHRINE 40 MCG/ML (10ML) SYRINGE FOR IV PUSH (FOR BLOOD PRESSURE SUPPORT)
PREFILLED_SYRINGE | INTRAVENOUS | Status: DC | PRN
  Administered 2020-07-14: 80 ug via INTRAVENOUS

## 2020-07-14 MED ORDER — ORAL CARE MOUTH RINSE: 15.0000 mL | Freq: Once | OROMUCOSAL | Status: AC

## 2020-07-14 MED ORDER — 0.9 % SODIUM CHLORIDE (POUR BTL) OPTIME
TOPICAL | Status: DC | PRN
  Administered 2020-07-14: 1000 mL

## 2020-07-14 MED ORDER — PROPOFOL 10 MG/ML IV BOLUS
INTRAVENOUS | Status: DC | PRN
  Administered 2020-07-14: 20 mg via INTRAVENOUS

## 2020-07-14 MED ORDER — ARTIFICIAL TEARS OPHTHALMIC OINT
TOPICAL_OINTMENT | OPHTHALMIC | Status: AC
  Filled 2020-07-14: qty 3.5

## 2020-07-14 MED ORDER — EPHEDRINE SULFATE-NACL 50-0.9 MG/10ML-% IV SOSY
PREFILLED_SYRINGE | INTRAVENOUS | Status: DC | PRN
  Administered 2020-07-14 (×2): 10 mg via INTRAVENOUS

## 2020-07-14 MED ORDER — CHLORHEXIDINE GLUCONATE 0.12 % MT SOLN
15.0000 mL | Freq: Once | OROMUCOSAL | Status: AC
  Administered 2020-07-14: 15 mL via OROMUCOSAL
  Filled 2020-07-14: qty 15

## 2020-07-14 MED ORDER — PAPAVERINE HCL 30 MG/ML IJ SOLN
INTRAMUSCULAR | Status: AC
  Filled 2020-07-14: qty 2

## 2020-07-14 SURGICAL SUPPLY — 28 items
ARMBAND PINK RESTRICT EXTREMIT (MISCELLANEOUS) ×3 IMPLANT
CANISTER SUCT 3000ML PPV (MISCELLANEOUS) ×3 IMPLANT
CLIP VESOCCLUDE MED 6/CT (CLIP) ×3 IMPLANT
CLIP VESOCCLUDE SM WIDE 6/CT (CLIP) ×3 IMPLANT
COVER PROBE W GEL 5X96 (DRAPES) ×3 IMPLANT
COVER WAND RF STERILE (DRAPES) ×3 IMPLANT
DERMABOND ADVANCED (GAUZE/BANDAGES/DRESSINGS) ×2
DERMABOND ADVANCED .7 DNX12 (GAUZE/BANDAGES/DRESSINGS) ×1 IMPLANT
ELECT REM PT RETURN 9FT ADLT (ELECTROSURGICAL) ×3
ELECTRODE REM PT RTRN 9FT ADLT (ELECTROSURGICAL) ×1 IMPLANT
GLOVE BIO SURGEON STRL SZ7.5 (GLOVE) ×3 IMPLANT
GOWN STRL REUS W/ TWL LRG LVL3 (GOWN DISPOSABLE) ×2 IMPLANT
GOWN STRL REUS W/ TWL XL LVL3 (GOWN DISPOSABLE) ×1 IMPLANT
GOWN STRL REUS W/TWL LRG LVL3 (GOWN DISPOSABLE) ×6
GOWN STRL REUS W/TWL XL LVL3 (GOWN DISPOSABLE) ×3
INSERT FOGARTY SM (MISCELLANEOUS) IMPLANT
KIT BASIN OR (CUSTOM PROCEDURE TRAY) ×3 IMPLANT
KIT TURNOVER KIT B (KITS) ×3 IMPLANT
NS IRRIG 1000ML POUR BTL (IV SOLUTION) ×3 IMPLANT
PACK CV ACCESS (CUSTOM PROCEDURE TRAY) ×3 IMPLANT
PAD ARMBOARD 7.5X6 YLW CONV (MISCELLANEOUS) ×6 IMPLANT
SUT MNCRL AB 4-0 PS2 18 (SUTURE) ×3 IMPLANT
SUT PROLENE 6 0 BV (SUTURE) ×6 IMPLANT
SUT VIC AB 3-0 SH 27 (SUTURE) ×3
SUT VIC AB 3-0 SH 27X BRD (SUTURE) ×1 IMPLANT
TOWEL GREEN STERILE (TOWEL DISPOSABLE) ×3 IMPLANT
UNDERPAD 30X36 HEAVY ABSORB (UNDERPADS AND DIAPERS) ×3 IMPLANT
WATER STERILE IRR 1000ML POUR (IV SOLUTION) ×3 IMPLANT

## 2020-07-14 NOTE — Anesthesia Postprocedure Evaluation (Signed)
Anesthesia Post Note  Patient: Katie Garcia  Procedure(s) Performed: RIGHT ARM ARTERIOVENOUS FISTULA CREATION (Right Arm Upper)     Patient location during evaluation: PACU Anesthesia Type: MAC Level of consciousness: awake and alert Pain management: pain level controlled Vital Signs Assessment: post-procedure vital signs reviewed and stable Respiratory status: spontaneous breathing and respiratory function stable Cardiovascular status: stable Postop Assessment: no apparent nausea or vomiting Anesthetic complications: no   No complications documented.  Last Vitals:  Vitals:   07/14/20 0940 07/14/20 0955  BP: (!) 142/52 (!) 145/70  Pulse: 87 84  Resp: 17 13  Temp: 36.4 C 36.5 C  SpO2: 100% 100%    Last Pain:  Vitals:   07/14/20 0955  TempSrc:   PainSc: 0-No pain                 Emagene Merfeld DANIEL

## 2020-07-14 NOTE — Interval H&P Note (Signed)
History and Physical Interval Note:  07/14/2020 6:29 AM  Katie Garcia  has presented today for surgery, with the diagnosis of END STAGE RENAL DISEASE.  The various methods of treatment have been discussed with the patient and family. After consideration of risks, benefits and other options for treatment, the patient has consented to  Procedure(s): RIGHT ARM ARTERIOVENOUS (AV) FISTULA CREATION VERSUS ARTERIOVENOUS GRAFT (Right) as a surgical intervention.  The patient's history has been reviewed, patient examined, no change in status, stable for surgery.  I have reviewed the patient's chart and labs.  Questions were answered to the patient's satisfaction.     [name]

## 2020-07-14 NOTE — Anesthesia Procedure Notes (Signed)
Procedure Name: MAC Date/Time: 07/14/2020 8:45 AM Performed by: Lowella Dell, CRNA Pre-anesthesia Checklist: Patient identified, Emergency Drugs available, Suction available, Patient being monitored and Timeout performed Patient Re-evaluated:Patient Re-evaluated prior to induction Oxygen Delivery Method: Simple face mask Induction Type: IV induction Placement Confirmation: positive ETCO2 Dental Injury: Teeth and Oropharynx as per pre-operative assessment

## 2020-07-14 NOTE — Transfer of Care (Signed)
Immediate Anesthesia Transfer of Care Note  Patient: Samiyyah Moffa Flegel  Procedure(s) Performed: RIGHT ARM ARTERIOVENOUS FISTULA CREATION (Right Arm Upper)  Patient Location: PACU  Anesthesia Type:MAC  Level of Consciousness: awake and patient cooperative  Airway & Oxygen Therapy: Patient Spontanous Breathing  Post-op Assessment: Report given to RN and Post -op Vital signs reviewed and stable  Post vital signs: Reviewed and stable  Last Vitals:  Vitals Value Taken Time  BP 142/52 07/14/20 0942  Temp    Pulse 90 07/14/20 0942  Resp 16 07/14/20 0942  SpO2 100 % 07/14/20 0942  Vitals shown include unvalidated device data.  Last Pain:  Vitals:   07/14/20 0726  TempSrc:   PainSc: 0-No pain         Complications: No complications documented.

## 2020-07-14 NOTE — Discharge Instructions (Signed)
° °  [facility] [place] ° °Discharge Instructions ° °AV Fistula or Graft Surgery for Dialysis Access ° °Please refer to the following instructions for your post-procedure care. Your surgeon or physician assistant will discuss any changes with you. ° °Activity ° °You may drive the day following your surgery, if you are comfortable and no longer taking prescription pain medication. Resume full activity as the soreness in your incision resolves. ° °Bathing/Showering ° °You may shower after you go home. Keep your incision dry for 48 hours. Do not soak in a bathtub, hot tub, or swim until the incision heals completely. You may not shower if you have a hemodialysis catheter. ° °Incision Care ° °Clean your incision with mild soap and water after 48 hours. Pat the area dry with a clean towel. You do not need a bandage unless otherwise instructed. Do not apply any ointments or creams to your incision. You may have skin glue on your incision. Do not peel it off. It will come off on its own in about one week. Your arm may swell a bit after surgery. To reduce swelling use pillows to elevate your arm so it is above your heart. Your doctor will tell you if you need to lightly wrap your arm with an ACE bandage. ° °Diet ° °Resume your normal diet. There are not special food restrictions following this procedure. In order to heal from your surgery, it is CRITICAL to get adequate nutrition. Your body requires vitamins, minerals, and protein. Vegetables are the best source of vitamins and minerals. Vegetables also provide the perfect balance of protein. Processed food has little nutritional value, so try to avoid this. ° °Medications ° °Resume taking all of your medications. If your incision is causing pain, you may take over-the counter pain relievers such as acetaminophen (Tylenol). If you were prescribed a stronger pain medication, please be aware these medications can cause nausea and constipation. Prevent  nausea by taking the medication with a snack or meal. Avoid constipation by drinking plenty of fluids and eating foods with high amount of fiber, such as fruits, vegetables, and grains. Do not take Tylenol if you are taking prescription pain medications. ° ° ° ° °Follow up °Your surgeon may want to see you in the office following your access surgery. If so, this will be arranged at the time of your surgery. ° °Please call us immediately for any of the following conditions: ° °Increased pain, redness, drainage (pus) from your incision site °Fever of 101 degrees or higher °Severe or worsening pain at your incision site °Hand pain or numbness. ° °Reduce your risk of vascular disease: ° °Stop smoking. If you would like help, call [facility] at 1-800-QUIT-NOW (1-800-784-8669) or [facility] at 336-586-4000 ° °Manage your cholesterol °Maintain a desired weight °Control your diabetes °Keep your blood pressure down ° °Dialysis ° °It will take several weeks to several months for your new dialysis access to be ready for use. Your surgeon will determine when it is OK to use it. Your nephrologist will continue to direct your dialysis. You can continue to use your Permcath until your new access is ready for use. ° °If you have any questions, please call the office at [phone]. ° °

## 2020-07-14 NOTE — Op Note (Signed)
    Patient name: Katie Garcia MRN: [id number] DOB: 1941/11/26 Sex: female  07/14/2020 Pre-operative Diagnosis: ESRD Post-operative diagnosis:  Same Surgeon:  Erlene Quan C. Donzetta Matters, MD Assistant: Risa Grill, PA Procedure Performed:Right arm brachial artery to cephalic vein av fistula creation  Indications: 79 year old female on dialysis via catheter.  She has previously undergone left upper extremity access which has now thrombosed.  She is indicated for right upper extremity access appears to have marginal cephalic vein and is indicated for possible fistula versus graft.  Assistant was necessary for retraction with exposure, suction, assistance with anastomosis and wound closure.  Findings: Cephalic vein at the antecubital measured proximally 4 mm was easily dilated to 4 mm flushed with heparinized saline easily.  Brachial artery was 3 mm was free of disease.  At completion there was a strong thrill in the vein confirmed with Doppler and a palpable radial artery pulse at the wrist also confirmed with Doppler.   Procedure:  The patient was identified in the holding area and taken to the operating room where she is placed supine on the operative table and MAC anesthesia was induced.  She was sterilely prepped and draped in the right arm in the usual fashion antibiotics were minister timeout was called.  Ultrasound was used we identified a healthy-appearing brachial artery and cephalic vein which appeared to be nearly 4 mm at the antecubitum.  The area was anesthetized 1% lidocaine with epinephrine.  A transverse incision was made.  We dissected out the vein divided branches between ties.  We dissected through the deep fascia the artery placed a vessel loop around this.  The vein was then transected distally and tied off.  It was spatulated flushed with heparinized saline dilated up to 4 mm.  It was then clamped.  The arteries clamped distally proximally opened longitudinally flushed heparinized saline  both directions.  The vein was then sewn end-to-side with 6-0 Prolene suture.  Prior to completion we allowed flushing all directions.  Upon completion there was pulsatility in the fistula we are able to follow this higher up in the arm there was somewhat of a better thrill.  There was a palpable radial artery pulse at the wrist.  These were both confirmed with Doppler.  We irrigated the wound obtain hemostasis and closed in layers of Vicryl and Monocryl.  She was awakened from anesthesia having tolerated procedure without any complication.  All counts were correct at completion.  EBL: 30 cc   Verlean Allport C. Donzetta Matters, MD [facility] [place] [place] Office: [phone] Pager: [phone]

## 2020-07-15 ENCOUNTER — Encounter (HOSPITAL_COMMUNITY): Payer: Self-pay | Admitting: Vascular Surgery

## 2020-08-04 ENCOUNTER — Other Ambulatory Visit: Payer: Self-pay

## 2020-08-04 DIAGNOSIS — N186 End stage renal disease: Secondary | ICD-10-CM

## 2020-08-13 ENCOUNTER — Other Ambulatory Visit: Payer: Self-pay

## 2020-08-13 ENCOUNTER — Encounter: Payer: Self-pay | Admitting: Podiatry

## 2020-08-13 ENCOUNTER — Ambulatory Visit: Payer: Medicare Other | Admitting: Podiatry

## 2020-08-13 DIAGNOSIS — Z992 Dependence on renal dialysis: Secondary | ICD-10-CM

## 2020-08-13 DIAGNOSIS — L84 Corns and callosities: Secondary | ICD-10-CM

## 2020-08-13 DIAGNOSIS — M79674 Pain in right toe(s): Secondary | ICD-10-CM | POA: Diagnosis not present

## 2020-08-13 DIAGNOSIS — N186 End stage renal disease: Secondary | ICD-10-CM | POA: Diagnosis not present

## 2020-08-13 DIAGNOSIS — B351 Tinea unguium: Secondary | ICD-10-CM | POA: Diagnosis not present

## 2020-08-13 DIAGNOSIS — M79675 Pain in left toe(s): Secondary | ICD-10-CM

## 2020-08-13 DIAGNOSIS — E0822 Diabetes mellitus due to underlying condition with diabetic chronic kidney disease: Secondary | ICD-10-CM

## 2020-08-13 DIAGNOSIS — Z794 Long term (current) use of insulin: Secondary | ICD-10-CM

## 2020-08-14 NOTE — Progress Notes (Signed)
Subjective: Katie Garcia presents today at risk foot care. Pt has h/o NIDDM with chronic kidney disease and callus(es) b/l feet and painful mycotic toenails b/l that are difficult to trim. Pain interferes with ambulation. Aggravating factors include wearing enclosed shoe gear. Pain is relieved with periodic professional debridement.  She voices no new pedal problems on today's visit. She continues dialysis treatment on TTS.  Katie Cha, Katie Garcia is patient's PCP.She has scheduled an appointment.  Past Medical History:  Diagnosis Date  . Anemia   . Arthritis   . Chronic kidney disease    Dialysis T/Th/Sa  . Diabetes mellitus without complication ([place])   . GERD (gastroesophageal reflux disease)   . Headache   . Hypertension      Current Outpatient Medications on File Prior to Visit  Medication Sig Dispense Refill  . ACCU-CHEK GUIDE test strip USE TO CHECK BG UP TO 4 TIMES A DAY (DX: E11.22 - DIABETES WITH CKD AND INSULIN THERAPY) IN VITRO  1  . acetaminophen (TYLENOL) 500 MG tablet Take 1,000 mg by mouth every 6 (six) hours as needed for moderate pain or headache.    . allopurinol (ZYLOPRIM) 100 MG tablet Take 100 mg by mouth every other day.     . B Complex-C-Zn-Folic Acid (DIALYVITE/ZINC) TABS Take 1 tablet by mouth every evening.   3  . calcium acetate (PHOSLO) 667 MG capsule Take 1,334 mg by mouth 2 (two) times daily with a meal.   3  . Calcium Carbonate Antacid (TUMS E-X PO) Take 2 tablets by mouth daily as needed (acid reflux).     . carvedilol (COREG) 25 MG tablet Take 25 mg by mouth 2 (two) times daily with a meal.    . guaifenesin (ROBITUSSIN) 100 MG/5ML syrup Take 100 mg by mouth 3 (three) times daily as needed for cough.    Marland Kitchen HYDROcodone-acetaminophen (NORCO/VICODIN) 5-325 MG tablet Take 1 tablet by mouth every 4 (four) hours as needed for moderate pain. 16 tablet 0  . insulin NPH-regular Human (HUMULIN 70/30) (70-30) 100 UNIT/ML injection Inject 32 Units into the skin  daily with breakfast. 20 mL 11  . losartan (COZAAR) 50 MG tablet Take 50 mg by mouth daily.     . Methoxy PEG-Epoetin Beta (MIRCERA IJ) Apply 1 application topically once a week.     Marland Kitchen RELION INSULIN SYR 0.5ML/31G 31G X 5/16" 0.5 ML MISC USE 1 ONCE DAILY    . rosuvastatin (CRESTOR) 10 MG tablet Take 10 mg by mouth daily.     Marland Kitchen triamcinolone cream (KENALOG) 0.1 % Apply 1 application topically daily as needed (rash).      No current facility-administered medications on file prior to visit.     Allergies  Allergen Reactions  . Penicillins Rash    Did it involve swelling of the face/tongue/throat, SOB, or low BP? No Did it involve sudden or severe rash/hives, skin peeling, or any reaction on the inside of your mouth or nose? Yes Did you need to seek medical attention at a hospital or doctor's office? N/A When did it last happen?N/A If all above answers are "NO", may proceed with cephalosporin use.    Objective: Katie Garcia is a pleasant 79 y.o. y.o. Patient Race: Black or African American [2]  female in NAD. AAO x 3.  There were no vitals filed for this visit.  Vascular Examination: Neurovascular status unchanged b/l. Capillary fill time to digits <3 seconds b/l. Palpable DP pulses b/l. Faintly palpable PT pulses b/l.  Pedal hair absent b/l Skin temperature gradient within normal limits b/l. Nonpitting edema noted b/l LE.  Dermatological Examination: Pedal skin with normal turgor, texture and tone bilaterally. No open wounds bilaterally. No interdigital macerations bilaterally. Toenails 1-5 b/l elongated, dystrophic, thickened, crumbly with subungual debris and tenderness to dorsal palpation. Hyperkeratotic lesion(s) submet head 5 left foot and submet head 5 right foot.  No erythema, no edema, no drainage, no flocculence.  Musculoskeletal: Normal muscle strength 5/5 to all lower extremity muscle groups bilaterally. No pain crepitus or joint limitation noted with ROM  b/l.  Neurological Examination: Protective sensation intact 5/5 intact bilaterally with 10g monofilament b/l. Vibratory sensation intact b/l.  Assessment: 1. Pain due to onychomycosis of toenails of both feet   2. Callus   3. Diabetes mellitus due to underlying condition with chronic kidney disease on chronic dialysis, with long-term current use of insulin ([place])    Plan: -Examined patient. -No new findings. No new orders. -Continue diabetic foot care principles. Literature dispensed on today.  -Toenails 1-5 b/l were debrided in length and girth with sterile nail nippers and dremel without iatrogenic bleeding.  -Callus(es) submet head 5 left foot and submet head 5 right foot pared utilizing sterile scalpel blade without complication or incident. Total number debrided =2. -Patient to continue soft, supportive shoe gear daily. -Patient to report any pedal injuries to medical professional immediately. -Patient/POA to call should there be question/concern in the interim.  Return in about 3 months (around 11/13/2020).  Marzetta Board, DPM

## 2020-08-16 ENCOUNTER — Telehealth: Payer: Self-pay | Admitting: Endocrinology

## 2020-08-16 NOTE — Telephone Encounter (Signed)
Lori from [facility] called asking if Dr Loanne Drilling would be able to change patient's insulin from Novolin to the Novolog in order to get patient assistance? Please advise. Ph# [phone]

## 2020-08-16 NOTE — Telephone Encounter (Signed)
Next Appt With Internal Medicine Renato Shin, MD) 08/18/2020 at 11:00 AM  Assuming you would prefer to address during next office appt as scheduled above

## 2020-08-18 ENCOUNTER — Encounter: Payer: Self-pay | Admitting: Endocrinology

## 2020-08-18 ENCOUNTER — Telehealth: Payer: Self-pay

## 2020-08-18 ENCOUNTER — Ambulatory Visit (INDEPENDENT_AMBULATORY_CARE_PROVIDER_SITE_OTHER): Payer: Medicare Other | Admitting: Endocrinology

## 2020-08-18 ENCOUNTER — Other Ambulatory Visit: Payer: Self-pay

## 2020-08-18 VITALS — BP 110/68 | HR 84 | Ht 60.0 in | Wt 195.4 lb

## 2020-08-18 DIAGNOSIS — E1122 Type 2 diabetes mellitus with diabetic chronic kidney disease: Secondary | ICD-10-CM

## 2020-08-18 DIAGNOSIS — N186 End stage renal disease: Secondary | ICD-10-CM

## 2020-08-18 DIAGNOSIS — IMO0002 Reserved for concepts with insufficient information to code with codable children: Secondary | ICD-10-CM

## 2020-08-18 DIAGNOSIS — E1165 Type 2 diabetes mellitus with hyperglycemia: Secondary | ICD-10-CM | POA: Diagnosis not present

## 2020-08-18 LAB — POCT GLYCOSYLATED HEMOGLOBIN (HGB A1C): Hemoglobin A1C: 9.4 % — AB (ref 4.0–5.6)

## 2020-08-18 MED ORDER — NOVOLOG MIX 70/30 FLEXPEN (70-30) 100 UNIT/ML ~~LOC~~ SUPN: 35.0000 [IU] | PEN_INJECTOR | Freq: Every day | SUBCUTANEOUS | 11 refills | Status: DC

## 2020-08-18 NOTE — Telephone Encounter (Signed)
error 

## 2020-08-18 NOTE — Telephone Encounter (Signed)
Outpatient Medication Detail   Disp Refills Start End   insulin aspart protamine - aspart (NOVOLOG MIX 70/30 FLEXPEN) (70-30) 100 UNIT/ML FlexPen 15 mL 11 08/18/2020    Sig - Route: Inject 0.35 mLs (35 Units total) into the skin daily with breakfast. - Subcutaneous   Class: No Print    Above is written as NO PRINT in anticipation of completing PAP form as requested by Cecille Rubin with [facility]. Returned Lori's call and left deatiled VM advising her to return my call.

## 2020-08-18 NOTE — Telephone Encounter (Signed)
Received [employer] PAP Provider Rx portion from [facility]. Forms have been completed and placed on Dr. Cordelia Pen desk for him to review and to sign. Once completed, they will be faxed back to [facility], ATTENTION: LORI

## 2020-08-18 NOTE — Telephone Encounter (Signed)
TC from [facility] and let her know Dr. Loanne Drilling agrees to change and signing the form, they will fax the doctor portion of the PAP to [place] and once complete they request we fax it back to them at the number they provide with the form.

## 2020-08-18 NOTE — Progress Notes (Signed)
Subjective:    Patient ID: Katie Garcia, female    DOB: 01-17-1941, 79 y.o.   MRN: [id number]  HPI Pt returns for f/u of diabetes mellitus:  DM type: Insulin-requiring type 2 (but vitiligo suggests type 1).   Dx'ed: 9242 Complications: PN, DR, and ESRD (on HD).  Therapy: insulin since 2004.   GDM: 1970 DKA: never.   Severe hypoglycemia: last episode was 2017.    Pancreatitis: never.   Pancreatic imaging: never.  SDOH: Katie Garcia takes human insulin, due to cost.  On HD days (morning), Katie Garcia takes insulin with breakfast when Katie Garcia gets home.  Other: Katie Garcia takes QD insulin, after poor results with multiple daily injections; Katie Garcia was changed to 70/30 qam, due to pattern of cbg's; fructosamine has confirmed A1c.   Interval history: Katie Garcia brings her meter with her cbg's which I have reviewed today.  cbg varies from 73-314.  It is in general lowest after lunch is misses or delayed.  pt states Katie Garcia feels well in general.  Pt says Katie Garcia never misses the insulin.  No recent steroids. Pharmacist at [facility] offers to help with pt assist for novolog 70/30.  Past Medical History:  Diagnosis Date  . Anemia   . Arthritis   . Chronic kidney disease    Dialysis T/Th/Sa  . Diabetes mellitus without complication ([place])   . GERD (gastroesophageal reflux disease)   . Headache   . Hypertension     Past Surgical History:  Procedure Laterality Date  . ABDOMINAL HYSTERECTOMY    . AV FISTULA PLACEMENT Left 10/02/2016   Procedure: ARTERIOVENOUS (AV) FISTULA CREATION LEFT UPPER ARM;  Surgeon: Waynetta Sandy, MD;  Location: [facility];  Service: Vascular;  Laterality: Left;  . AV FISTULA PLACEMENT Right 07/14/2020   Procedure: RIGHT ARM ARTERIOVENOUS FISTULA CREATION;  Surgeon: Waynetta Sandy, MD;  Location: [facility];  Service: Vascular;  Laterality: Right;  . BASCILIC VEIN TRANSPOSITION Left 05/17/2020   Procedure: Insertion of Left arm arteriovenous gortex graftarm ;  Surgeon: Angelia Mould, MD;   Location: [facility] OR;  Service: Vascular;  Laterality: Left;  . CESAREAN SECTION      Social History   Socioeconomic History  . Marital status: Married    Spouse name: Not on file  . Number of children: Not on file  . Years of education: Not on file  . Highest education level: Not on file  Occupational History  . Not on file  Tobacco Use  . Smoking status: Never Smoker  . Smokeless tobacco: Never Used  Vaping Use  . Vaping Use: Never used  Substance and Sexual Activity  . Alcohol use: Not Currently  . Drug use: No  . Sexual activity: Not on file  Other Topics Concern  . Not on file  Social History Narrative  . Not on file   Social Determinants of Health   Financial Resource Strain:   . Difficulty of Paying Living Expenses: Not on file  Food Insecurity:   . Worried About Charity fundraiser in the Last Year: Not on file  . Ran Out of Food in the Last Year: Not on file  Transportation Needs:   . Lack of Transportation (Medical): Not on file  . Lack of Transportation (Non-Medical): Not on file  Physical Activity:   . Days of Exercise per Week: Not on file  . Minutes of Exercise per Session: Not on file  Stress:   . Feeling of Stress : Not on file  Social  Connections:   . Frequency of Communication with Friends and Family: Not on file  . Frequency of Social Gatherings with Friends and Family: Not on file  . Attends Religious Services: Not on file  . Active Member of Clubs or Organizations: Not on file  . Attends Archivist Meetings: Not on file  . Marital Status: Not on file  Intimate Partner Violence:   . Fear of Current or Ex-Partner: Not on file  . Emotionally Abused: Not on file  . Physically Abused: Not on file  . Sexually Abused: Not on file    Current Outpatient Medications on File Prior to Visit  Medication Sig Dispense Refill  . ACCU-CHEK GUIDE test strip 1 each by Other route 4 (four) times daily. E11.22  1  . acetaminophen (TYLENOL) 500 MG  tablet Take 1,000 mg by mouth every 6 (six) hours as needed for moderate pain or headache.    . allopurinol (ZYLOPRIM) 100 MG tablet Take 100 mg by mouth every other day.     . B Complex-C-Zn-Folic Acid (DIALYVITE/ZINC) TABS Take 1 tablet by mouth every evening.   3  . calcium acetate (PHOSLO) 667 MG capsule Take 1,334 mg by mouth 2 (two) times daily with a meal.   3  . Calcium Carbonate Antacid (TUMS E-X PO) Take 2 tablets by mouth daily as needed (acid reflux).     . carvedilol (COREG) 25 MG tablet Take 25 mg by mouth 2 (two) times daily with a meal.    . guaifenesin (ROBITUSSIN) 100 MG/5ML syrup Take 100 mg by mouth 3 (three) times daily as needed for cough.    Marland Kitchen HYDROcodone-acetaminophen (NORCO/VICODIN) 5-325 MG tablet Take 1 tablet by mouth every 4 (four) hours as needed for moderate pain. (Patient not taking: Reported on 08/20/2020) 16 tablet 0  . losartan (COZAAR) 50 MG tablet Take 50 mg by mouth daily.     . Methoxy PEG-Epoetin Beta (MIRCERA IJ) Apply 1 application topically once a week.     Marland Kitchen RELION INSULIN SYR 0.5ML/31G 31G X 5/16" 0.5 ML MISC USE 1 ONCE DAILY    . rosuvastatin (CRESTOR) 10 MG tablet Take 10 mg by mouth daily.     Marland Kitchen triamcinolone cream (KENALOG) 0.1 % Apply 1 application topically daily as needed (rash).      No current facility-administered medications on file prior to visit.    Allergies  Allergen Reactions  . Penicillins Rash    Did it involve swelling of the face/tongue/throat, SOB, or low BP? No Did it involve sudden or severe rash/hives, skin peeling, or any reaction on the inside of your mouth or nose? Yes Did you need to seek medical attention at a hospital or doctor's office? N/A When did it last happen?N/A If all above answers are "NO", may proceed with cephalosporin use.    Family History  Problem Relation Age of Onset  . Diabetes Mother     BP 110/68   Pulse 84   Ht 5' (1.524 m)   Wt 195 lb 6.4 oz (88.6 kg)   SpO2 98%   BMI 38.16 kg/m      Review of Systems Katie Garcia seldom has hypoglycemia, and these episodes are mild.      Objective:   Physical Exam VITAL SIGNS:  See vs page GENERAL: no distress Pulses: dorsalis pedis intact bilat.   MSK: no deformity of the feet CV: no leg edema Skin:  no ulcer on the feet.  normal color and temp on the  feet.  Katie Garcia has multiple areas of vitiligo throughout the body Neuro: sensation is intact to touch on the feet, but decreased from normal.     Lab Results  Component Value Date   HGBA1C 9.4 (A) 08/18/2020       Assessment & Plan:  Insulin-requiring type 2 DM, with ESRD: uncontrolled Hypoglycemia, due to insulin: this limits aggressiveness of glycemic control.   Patient Instructions  check your blood sugar twice a day.  vary the time of day when you check, between before the 3 meals, and at bedtime.  also check if you have symptoms of your blood sugar being too high or too low.  please keep a record of the readings and bring it to your next appointment here (or you can bring the meter itself).  You can write it on any piece of paper.  please call us sooner if your blood sugar goes below 70, or if you have a lot of readings over 200. Please change the insulin to novolog 70/30 (pharmacist at [facility] will help you), 35 units with breakfast.  When you cannot get this, please substitute with 70/30 from [facility]  On this type of insulin schedule, you should eat meals on a regular schedule (especially lunch).  If a meal is missed or significantly delayed, your blood sugar could go low.  Please come back for a follow-up appointment in 6 weeks.

## 2020-08-18 NOTE — Telephone Encounter (Signed)
Saw her today.  I changed rx.  I am happy to sign the pt assist form.

## 2020-08-18 NOTE — Patient Instructions (Signed)
check your blood sugar twice a day.  vary the time of day when you check, between before the 3 meals, and at bedtime.  also check if you have symptoms of your blood sugar being too high or too low.  please keep a record of the readings and bring it to your next appointment here (or you can bring the meter itself).  You can write it on any piece of paper.  please call us sooner if your blood sugar goes below 70, or if you have a lot of readings over 200. Please change the insulin to novolog 70/30 (pharmacist at [facility] will help you), 35 units with breakfast.  When you cannot get this, please substitute with 70/30 from [facility]  On this type of insulin schedule, you should eat meals on a regular schedule (especially lunch).  If a meal is missed or significantly delayed, your blood sugar could go low.  Please come back for a follow-up appointment in 6 weeks.

## 2020-08-20 ENCOUNTER — Other Ambulatory Visit: Payer: Self-pay

## 2020-08-20 ENCOUNTER — Ambulatory Visit (HOSPITAL_COMMUNITY)
Admission: RE | Admit: 2020-08-20 | Discharge: 2020-08-20 | Disposition: A | Discharge status: Home / Self Care | Payer: Medicare Other | Source: Ambulatory Visit | Attending: Vascular Surgery | Admitting: Vascular Surgery

## 2020-08-20 ENCOUNTER — Ambulatory Visit (INDEPENDENT_AMBULATORY_CARE_PROVIDER_SITE_OTHER): Payer: Medicare Other | Admitting: Physician Assistant

## 2020-08-20 VITALS — BP 126/62 | HR 80 | Temp 97.5°F | Resp 14 | Ht 60.0 in | Wt 194.0 lb

## 2020-08-20 DIAGNOSIS — N186 End stage renal disease: Secondary | ICD-10-CM

## 2020-08-20 NOTE — Progress Notes (Signed)
    Postoperative Access Visit   History of Present Illness   Katie Garcia is a 79 y.o. year old female who presents for postoperative follow-up for: right brachiocephalic arteriovenous fistula (Date: 07/14/20).  The patient's incision is healed.  The patient denies steal symptoms.  In the past she has had a left brachiocephalic fistula that she was able to dialyze with for about 2 years.  She then had a left arm AV graft placed which did not last very long.  She is currently dialyzing via right IJ [facility] on a Tuesday Thursday Saturday under the management of Dr. Carolin Sicks at the [facility].   Physical Examination   Vitals:   08/20/20 1516  BP: 126/62  Pulse: 80  Resp: 14  Temp: (!) 97.5 F (36.4 C)  TempSrc: Temporal  SpO2: 100%  Weight: 194 lb (88 kg)  Height: 5' (1.524 m)   Body mass index is 37.89 kg/m.  right arm Incision is healed, faintly palpable radial pulse, hand grip is 5/5, sensation in digits is intact, faintly palpable thrill near anastomosis but then becomes pulsatile    Medical Decision Making   Katie Garcia is a 79 y.o. year old female who presents s/p right brachiocephalic arteriovenous fistula   Patent brachiocephalic fistula without signs or symptoms of steal syndrome  Based on duplex, the fistula is greater than 6 mm in depth and also has several branches preventing fistula from maturing  I have offered the patient revision of fistula with branch ligation and superficialization/translocation  Prior to consenting to surgery she would like to further discuss this with her surgeon Dr. Donzetta Matters  She will be worked into Dr. Claretha Cooper office schedule in the next week or [place] PA-C [facility] [place] Office: [phone]

## 2020-08-27 ENCOUNTER — Ambulatory Visit: Payer: Self-pay | Admitting: Vascular Surgery

## 2020-08-27 ENCOUNTER — Encounter: Payer: Self-pay | Admitting: *Deleted

## 2020-08-27 ENCOUNTER — Other Ambulatory Visit: Payer: Self-pay | Admitting: *Deleted

## 2020-08-27 ENCOUNTER — Encounter: Payer: Self-pay | Admitting: Vascular Surgery

## 2020-08-27 ENCOUNTER — Other Ambulatory Visit: Payer: Self-pay

## 2020-08-27 VITALS — BP 150/83 | HR 90 | Temp 98.4°F | Resp 20 | Ht 60.0 in | Wt 198.0 lb

## 2020-08-27 DIAGNOSIS — N186 End stage renal disease: Secondary | ICD-10-CM

## 2020-08-27 NOTE — H&P (View-Only) (Signed)
Patient ID: Annalea Alguire Mell, female   DOB: 1941/10/10, 79 y.o.   MRN: [id number]  Reason for Consult: Follow-up   Referred by [name],*  Subjective:     HPI:  Ranessa Kosta Ackley is a 79 y.o. female status post right brachial artery to cephalic vein fistula.  She was recommended for transposition wanted another follow-up visit.  She continues on dialysis Tuesdays, Thursdays, Saturdays via right IJ catheter.  Past Medical History:  Diagnosis Date   Anemia    Arthritis    Chronic kidney disease    Dialysis T/Th/Sa   Diabetes mellitus without complication ([place])    GERD (gastroesophageal reflux disease)    Headache    Hypertension    Family History  Problem Relation Age of Onset   Diabetes Mother    Past Surgical History:  Procedure Laterality Date   ABDOMINAL HYSTERECTOMY     AV FISTULA PLACEMENT Left 10/02/2016   Procedure: ARTERIOVENOUS (AV) FISTULA CREATION LEFT UPPER ARM;  Surgeon: Waynetta Sandy, MD;  Location: [facility];  Service: Vascular;  Laterality: Left;   AV FISTULA PLACEMENT Right 07/14/2020   Procedure: RIGHT ARM ARTERIOVENOUS FISTULA CREATION;  Surgeon: Waynetta Sandy, MD;  Location: [facility];  Service: Vascular;  Laterality: Right;   [place] Left 05/17/2020   Procedure: Insertion of Left arm arteriovenous gortex graftarm ;  Surgeon: Angelia Mould, MD;  Location: [facility] OR;  Service: Vascular;  Laterality: Left;   CESAREAN SECTION      Short Social History:  Social History   Tobacco Use   Smoking status: Never Smoker   Smokeless tobacco: Never Used  Substance Use Topics   Alcohol use: Not Currently    Allergies  Allergen Reactions   Penicillins Rash    Did it involve swelling of the face/tongue/throat, SOB, or low BP? No Did it involve sudden or severe rash/hives, skin peeling, or any reaction on the inside of your mouth or nose? Yes Did you need to seek medical attention at a hospital or  doctor's office? N/A When did it last happen?N/A If all above answers are NO, may proceed with cephalosporin use.    Current Outpatient Medications  Medication Sig Dispense Refill   ACCU-CHEK GUIDE test strip 1 each by Other route 4 (four) times daily. E11.22  1   acetaminophen (TYLENOL) 500 MG tablet Take 1,000 mg by mouth every 6 (six) hours as needed for moderate pain or headache.     allopurinol (ZYLOPRIM) 100 MG tablet Take 100 mg by mouth every other day.      B Complex-C-Zn-Folic Acid (DIALYVITE/ZINC) TABS Take 1 tablet by mouth every evening.   3   calcium acetate (PHOSLO) 667 MG capsule Take 1,334 mg by mouth 2 (two) times daily with a meal.   3   Calcium Carbonate Antacid (TUMS E-X PO) Take 2 tablets by mouth daily as needed (acid reflux).      carvedilol (COREG) 25 MG tablet Take 25 mg by mouth 2 (two) times daily with a meal.     guaifenesin (ROBITUSSIN) 100 MG/5ML syrup Take 100 mg by mouth 3 (three) times daily as needed for cough.     insulin aspart protamine - aspart (NOVOLOG MIX 70/30 FLEXPEN) (70-30) 100 UNIT/ML FlexPen Inject 0.35 mLs (35 Units total) into the skin daily with breakfast. 15 mL 11   losartan (COZAAR) 50 MG tablet Take 50 mg by mouth daily.      Methoxy PEG-Epoetin Beta (MIRCERA IJ) Apply  1 application topically once a week.      RELION INSULIN SYR 0.5ML/31G 31G X 5/16" 0.5 ML MISC USE 1 ONCE DAILY     rosuvastatin (CRESTOR) 10 MG tablet Take 10 mg by mouth daily.      triamcinolone cream (KENALOG) 0.1 % Apply 1 application topically daily as needed (rash).      HYDROcodone-acetaminophen (NORCO/VICODIN) 5-325 MG tablet Take 1 tablet by mouth every 4 (four) hours as needed for moderate pain. (Patient not taking: Reported on 08/27/2020) 16 tablet 0   No current facility-administered medications for this visit.    Review of Systems  Constitutional:  Constitutional negative. HENT: HENT negative.  Eyes: Eyes negative.  Respiratory:  Respiratory negative.  Cardiovascular: Cardiovascular negative.  GI: Gastrointestinal negative.  Musculoskeletal: Musculoskeletal negative.  Skin: Skin negative.  Neurological: Neurological negative. Hematologic: Hematologic/lymphatic negative.  Psychiatric: Psychiatric negative.        Objective:  Objective   Vitals:   08/27/20 1439  BP: (!) 150/83  Pulse: 90  Resp: 20  Temp: 98.4 F (36.9 C)  SpO2: 98%  Weight: 198 lb (89.8 kg)  Height: 5' (1.524 m)   Body mass index is 38.67 kg/m.  Physical Exam HENT:     Head: Normocephalic.     Nose:     Comments: Mask in place Eyes:     Pupils: Pupils are equal, round, and reactive to light.  Cardiovascular:     Rate and Rhythm: Normal rate.  Pulmonary:     Effort: Pulmonary effort is normal.  Abdominal:     General: Abdomen is flat.     Palpations: Abdomen is soft.  Skin:    General: Skin is warm.     Capillary Refill: Capillary refill takes less than 2 seconds.  Neurological:     General: No focal deficit present.     Mental Status: She is alert.  Psychiatric:        Mood and Affect: Mood normal.        Behavior: Behavior normal.        Thought Content: Thought content normal.        Judgment: Judgment normal.     Data: Dialysis Duplex summary:  Patent arteriovenous fistula with increased velocity at the neck leading  into  the anastomosis (0.25 cm in length) with increased velocity. Increased  velocity  also noted in the mid to distal outflow vein at area of narrowing.      Assessment/Plan:     79 year old female status post creation of right arm AV fistula.  We will plan for revision with branch ligation and likely transposition on a nondialysis day in the near future.  We discussed the risk and benefits as well as expected outcomes and she demonstrates good understanding.     Waynetta Sandy MD [facility]

## 2020-08-27 NOTE — Progress Notes (Signed)
Patient ID: Tylesha Gibeault Hopf, female   DOB: Sep 07, 1941, 79 y.o.   MRN: [id number]  Reason for Consult: Follow-up   Referred by [name],*  Subjective:     HPI:  Heidemarie Goodnow Kiger is a 79 y.o. female status post right brachial artery to cephalic vein fistula.  She was recommended for transposition wanted another follow-up visit.  She continues on dialysis Tuesdays, Thursdays, Saturdays via right IJ catheter.  Past Medical History:  Diagnosis Date   Anemia    Arthritis    Chronic kidney disease    Dialysis T/Th/Sa   Diabetes mellitus without complication ([place])    GERD (gastroesophageal reflux disease)    Headache    Hypertension    Family History  Problem Relation Age of Onset   Diabetes Mother    Past Surgical History:  Procedure Laterality Date   ABDOMINAL HYSTERECTOMY     AV FISTULA PLACEMENT Left 10/02/2016   Procedure: ARTERIOVENOUS (AV) FISTULA CREATION LEFT UPPER ARM;  Surgeon: Waynetta Sandy, MD;  Location: [place];  Service: Vascular;  Laterality: Left;   AV FISTULA PLACEMENT Right 07/14/2020   Procedure: RIGHT ARM ARTERIOVENOUS FISTULA CREATION;  Surgeon: Waynetta Sandy, MD;  Location: [place];  Service: Vascular;  Laterality: Right;   [place] Left 05/17/2020   Procedure: Insertion of Left arm arteriovenous gortex graftarm ;  Surgeon: Angelia Mould, MD;  Location: [facility] OR;  Service: Vascular;  Laterality: Left;   CESAREAN SECTION      Short Social History:  Social History   Tobacco Use   Smoking status: Never Smoker   Smokeless tobacco: Never Used  Substance Use Topics   Alcohol use: Not Currently    Allergies  Allergen Reactions   Penicillins Rash    Did it involve swelling of the face/tongue/throat, SOB, or low BP? No Did it involve sudden or severe rash/hives, skin peeling, or any reaction on the inside of your mouth or nose? Yes Did you need to seek medical attention at a hospital or  doctor's office? N/A When did it last happen?N/A If all above answers are NO, may proceed with cephalosporin use.    Current Outpatient Medications  Medication Sig Dispense Refill   ACCU-CHEK GUIDE test strip 1 each by Other route 4 (four) times daily. E11.22  1   acetaminophen (TYLENOL) 500 MG tablet Take 1,000 mg by mouth every 6 (six) hours as needed for moderate pain or headache.     allopurinol (ZYLOPRIM) 100 MG tablet Take 100 mg by mouth every other day.      B Complex-C-Zn-Folic Acid (DIALYVITE/ZINC) TABS Take 1 tablet by mouth every evening.   3   calcium acetate (PHOSLO) 667 MG capsule Take 1,334 mg by mouth 2 (two) times daily with a meal.   3   Calcium Carbonate Antacid (TUMS E-X PO) Take 2 tablets by mouth daily as needed (acid reflux).      carvedilol (COREG) 25 MG tablet Take 25 mg by mouth 2 (two) times daily with a meal.     guaifenesin (ROBITUSSIN) 100 MG/5ML syrup Take 100 mg by mouth 3 (three) times daily as needed for cough.     insulin aspart protamine - aspart (NOVOLOG MIX 70/30 FLEXPEN) (70-30) 100 UNIT/ML FlexPen Inject 0.35 mLs (35 Units total) into the skin daily with breakfast. 15 mL 11   losartan (COZAAR) 50 MG tablet Take 50 mg by mouth daily.      Methoxy PEG-Epoetin Beta (MIRCERA IJ) Apply  1 application topically once a week.      RELION INSULIN SYR 0.5ML/31G 31G X 5/16" 0.5 ML MISC USE 1 ONCE DAILY     rosuvastatin (CRESTOR) 10 MG tablet Take 10 mg by mouth daily.      triamcinolone cream (KENALOG) 0.1 % Apply 1 application topically daily as needed (rash).      HYDROcodone-acetaminophen (NORCO/VICODIN) 5-325 MG tablet Take 1 tablet by mouth every 4 (four) hours as needed for moderate pain. (Patient not taking: Reported on 08/27/2020) 16 tablet 0   No current facility-administered medications for this visit.    Review of Systems  Constitutional:  Constitutional negative. HENT: HENT negative.  Eyes: Eyes negative.  Respiratory:  Respiratory negative.  Cardiovascular: Cardiovascular negative.  GI: Gastrointestinal negative.  Musculoskeletal: Musculoskeletal negative.  Skin: Skin negative.  Neurological: Neurological negative. Hematologic: Hematologic/lymphatic negative.  Psychiatric: Psychiatric negative.        Objective:  Objective   Vitals:   08/27/20 1439  BP: (!) 150/83  Pulse: 90  Resp: 20  Temp: 98.4 F (36.9 C)  SpO2: 98%  Weight: 198 lb (89.8 kg)  Height: 5' (1.524 m)   Body mass index is 38.67 kg/m.  Physical Exam HENT:     Head: Normocephalic.     Nose:     Comments: Mask in place Eyes:     Pupils: Pupils are equal, round, and reactive to light.  Cardiovascular:     Rate and Rhythm: Normal rate.  Pulmonary:     Effort: Pulmonary effort is normal.  Abdominal:     General: Abdomen is flat.     Palpations: Abdomen is soft.  Skin:    General: Skin is warm.     Capillary Refill: Capillary refill takes less than 2 seconds.  Neurological:     General: No focal deficit present.     Mental Status: She is alert.  Psychiatric:        Mood and Affect: Mood normal.        Behavior: Behavior normal.        Thought Content: Thought content normal.        Judgment: Judgment normal.     Data: Dialysis Duplex summary:  Patent arteriovenous fistula with increased velocity at the neck leading  into  the anastomosis (0.25 cm in length) with increased velocity. Increased  velocity  also noted in the mid to distal outflow vein at area of narrowing.      Assessment/Plan:     79 year old female status post creation of right arm AV fistula.  We will plan for revision with branch ligation and likely transposition on a nondialysis day in the near future.  We discussed the risk and benefits as well as expected outcomes and she demonstrates good understanding.     Waynetta Sandy MD [facility]

## 2020-09-13 ENCOUNTER — Encounter (HOSPITAL_COMMUNITY): Payer: Self-pay | Admitting: Vascular Surgery

## 2020-09-13 ENCOUNTER — Other Ambulatory Visit (HOSPITAL_COMMUNITY)
Admission: RE | Admit: 2020-09-13 | Discharge: 2020-09-13 | Disposition: A | Discharge status: Home / Self Care | Payer: Medicare Other | Source: Ambulatory Visit | Attending: Vascular Surgery | Admitting: Vascular Surgery

## 2020-09-13 DIAGNOSIS — Z01812 Encounter for preprocedural laboratory examination: Secondary | ICD-10-CM | POA: Insufficient documentation

## 2020-09-13 DIAGNOSIS — Z20822 Contact with and (suspected) exposure to covid-19: Secondary | ICD-10-CM | POA: Diagnosis not present

## 2020-09-13 LAB — SARS CORONAVIRUS 2 (TAT 6-24 HRS): SARS Coronavirus 2: NEGATIVE

## 2020-09-13 NOTE — Progress Notes (Signed)
Spoke to patient and daughter for pre-op call. Denies chest pain, shortness of breath, or cardiology visit. States in quarantine since COVID test. Educated on visitation policy. Below instructions given regarding DM management.    o Check your blood sugar the morning of your surgery when you wake up and every 2 hours until you get to the Short Stay unit. . If your blood sugar is less than 70 mg/dL, you will need to treat for low blood sugar: o Do not take insulin. o Treat a low blood sugar (less than 70 mg/dL) with  cup of clear juice (cranberry or apple), 4 glucose tablets, OR glucose gel. Recheck blood sugar in 15 minutes after treatment (to make sure it is greater than 70 mg/dL). If your blood sugar is not greater than 70 mg/dL on recheck, call [phone] o  for further instructions. . Report your blood sugar to the short stay nurse when you get to Short Stay.  . If you are admitted to the hospital after surgery: o Your blood sugar will be checked by the staff and you will probably be given insulin after surgery (instead of oral diabetes medicines) to make sure you have good blood sugar levels. o The goal for blood sugar control after surgery is 80-180 mg/dL.    WHAT DO I DO ABOUT MY DIABETES MEDICATION?    Marland Kitchen Take full dose of insulin Tuesday morning.       . THE MORNING OF SURGERY, take _____0________ units of insulin.

## 2020-09-14 DIAGNOSIS — R519 Headache, unspecified: Secondary | ICD-10-CM | POA: Insufficient documentation

## 2020-09-14 NOTE — Anesthesia Preprocedure Evaluation (Addendum)
Anesthesia Evaluation  Patient identified by MRN, date of birth, ID band Patient awake    Reviewed: Allergy & Precautions, NPO status , Patient's Chart, lab work & pertinent test results, reviewed documented beta blocker date and time   History of Anesthesia Complications Negative for: history of anesthetic complications  Airway Mallampati: II  TM Distance: >3 FB Neck ROM: Full    Dental  (+) Upper Dentures, Lower Dentures   Pulmonary neg pulmonary ROS,    Pulmonary exam normal        Cardiovascular hypertension, Pt. on home beta blockers and Pt. on medications Normal cardiovascular exam   '19 TTE - EF 55% to 60%. Grade 2 diastolic dysfunction. Trivial MR. PASP 11mHg.    Neuro/Psych  Headaches, negative psych ROS   GI/Hepatic Neg liver ROS, GERD  Medicated and Controlled,  Endo/Other  diabetes, Type 2, Insulin Dependent Obesity   Renal/GU ESRF and DialysisRenal disease     Musculoskeletal  (+) Arthritis ,   Abdominal   Peds  Hematology negative hematology ROS (+)   Anesthesia Other Findings Covid test negative   Reproductive/Obstetrics                            Anesthesia Physical Anesthesia Plan  ASA: III  Anesthesia Plan: MAC   Post-op Pain Management:    Induction: Intravenous  PONV Risk Score and Plan: 2 and Propofol infusion and Treatment may vary due to age or medical condition  Airway Management Planned: Natural Airway and Simple Face Mask  Additional Equipment: None  Intra-op Plan:   Post-operative Plan:   Informed Consent: I have reviewed the patients History and Physical, chart, labs and discussed the procedure including the risks, benefits and alternatives for the proposed anesthesia with the patient or authorized representative who has indicated his/her understanding and acceptance.       Plan Discussed with: CRNA and Anesthesiologist  Anesthesia Plan  Comments:        Anesthesia Quick Evaluation

## 2020-09-15 ENCOUNTER — Ambulatory Visit (HOSPITAL_COMMUNITY)
Admission: RE | Admit: 2020-09-15 | Discharge: 2020-09-15 | Disposition: A | Discharge status: Home / Self Care | Payer: Medicare Other | Attending: Vascular Surgery | Admitting: Vascular Surgery

## 2020-09-15 ENCOUNTER — Ambulatory Visit (HOSPITAL_COMMUNITY): Payer: Medicare Other | Admitting: Anesthesiology

## 2020-09-15 ENCOUNTER — Encounter (HOSPITAL_COMMUNITY): Payer: Self-pay | Admitting: Vascular Surgery

## 2020-09-15 ENCOUNTER — Other Ambulatory Visit: Payer: Self-pay

## 2020-09-15 ENCOUNTER — Encounter (HOSPITAL_COMMUNITY)
Admission: RE | Disposition: A | Discharge status: Home / Self Care | Payer: Self-pay | Source: Home / Self Care | Attending: Vascular Surgery

## 2020-09-15 DIAGNOSIS — Z880 Allergy status to penicillin: Secondary | ICD-10-CM | POA: Diagnosis not present

## 2020-09-15 DIAGNOSIS — K219 Gastro-esophageal reflux disease without esophagitis: Secondary | ICD-10-CM | POA: Diagnosis not present

## 2020-09-15 DIAGNOSIS — Y841 Kidney dialysis as the cause of abnormal reaction of the patient, or of later complication, without mention of misadventure at the time of the procedure: Secondary | ICD-10-CM | POA: Diagnosis not present

## 2020-09-15 DIAGNOSIS — E1122 Type 2 diabetes mellitus with diabetic chronic kidney disease: Secondary | ICD-10-CM | POA: Insufficient documentation

## 2020-09-15 DIAGNOSIS — I120 Hypertensive chronic kidney disease with stage 5 chronic kidney disease or end stage renal disease: Secondary | ICD-10-CM | POA: Insufficient documentation

## 2020-09-15 DIAGNOSIS — N185 Chronic kidney disease, stage 5: Secondary | ICD-10-CM | POA: Diagnosis not present

## 2020-09-15 DIAGNOSIS — N186 End stage renal disease: Secondary | ICD-10-CM | POA: Diagnosis not present

## 2020-09-15 DIAGNOSIS — Z794 Long term (current) use of insulin: Secondary | ICD-10-CM | POA: Insufficient documentation

## 2020-09-15 DIAGNOSIS — T82510A Breakdown (mechanical) of surgically created arteriovenous fistula, initial encounter: Secondary | ICD-10-CM | POA: Insufficient documentation

## 2020-09-15 DIAGNOSIS — Z79899 Other long term (current) drug therapy: Secondary | ICD-10-CM | POA: Insufficient documentation

## 2020-09-15 HISTORY — DX: Type 2 diabetes mellitus without complications: E11.9

## 2020-09-15 HISTORY — PX: REVISON OF ARTERIOVENOUS FISTULA: SHX6074

## 2020-09-15 HISTORY — DX: Gout, unspecified: M10.9

## 2020-09-15 LAB — POCT I-STAT, CHEM 8
BUN: 34 mg/dL — ABNORMAL HIGH (ref 8–23)
Calcium, Ion: 0.98 mmol/L — ABNORMAL LOW (ref 1.15–1.40)
Chloride: 99 mmol/L (ref 98–111)
Creatinine, Ser: 6.9 mg/dL — ABNORMAL HIGH (ref 0.44–1.00)
Glucose, Bld: 211 mg/dL — ABNORMAL HIGH (ref 70–99)
HCT: 34 % — ABNORMAL LOW (ref 36.0–46.0)
Hemoglobin: 11.6 g/dL — ABNORMAL LOW (ref 12.0–15.0)
Potassium: 3.8 mmol/L (ref 3.5–5.1)
Sodium: 136 mmol/L (ref 135–145)
TCO2: 29 mmol/L (ref 22–32)

## 2020-09-15 LAB — GLUCOSE, CAPILLARY
Glucose-Capillary: 202 mg/dL — ABNORMAL HIGH (ref 70–99)
Glucose-Capillary: 198 mg/dL — ABNORMAL HIGH (ref 70–99)

## 2020-09-15 SURGERY — REVISON OF ARTERIOVENOUS FISTULA
Anesthesia: Monitor Anesthesia Care | Site: Arm Upper | Laterality: Right

## 2020-09-15 MED ORDER — FENTANYL CITRATE (PF) 100 MCG/2ML IJ SOLN: 25.0000 ug | INTRAMUSCULAR | Status: DC | PRN

## 2020-09-15 MED ORDER — LIDOCAINE HCL (CARDIAC) PF 100 MG/5ML IV SOSY
PREFILLED_SYRINGE | INTRAVENOUS | Status: DC | PRN
  Administered 2020-09-15: 60 mg via INTRAVENOUS

## 2020-09-15 MED ORDER — LIDOCAINE-EPINEPHRINE (PF) 1 %-1:200000 IJ SOLN
INTRAMUSCULAR | Status: AC
  Filled 2020-09-15: qty 30

## 2020-09-15 MED ORDER — CHLORHEXIDINE GLUCONATE 4 % EX LIQD: 60.0000 mL | Freq: Once | CUTANEOUS | Status: DC

## 2020-09-15 MED ORDER — ONDANSETRON HCL 4 MG/2ML IJ SOLN: 4.0000 mg | Freq: Once | INTRAMUSCULAR | Status: DC | PRN

## 2020-09-15 MED ORDER — ONDANSETRON HCL 4 MG/2ML IJ SOLN
INTRAMUSCULAR | Status: DC | PRN
  Administered 2020-09-15: 4 mg via INTRAVENOUS

## 2020-09-15 MED ORDER — SODIUM CHLORIDE 0.9 % IV SOLN: INTRAVENOUS | Status: DC

## 2020-09-15 MED ORDER — FENTANYL CITRATE (PF) 250 MCG/5ML IJ SOLN
INTRAMUSCULAR | Status: AC
  Filled 2020-09-15: qty 5

## 2020-09-15 MED ORDER — OXYCODONE HCL 5 MG PO TABS: 5.0000 mg | ORAL_TABLET | Freq: Once | ORAL | Status: DC | PRN

## 2020-09-15 MED ORDER — PROPOFOL 500 MG/50ML IV EMUL
INTRAVENOUS | Status: DC | PRN
  Administered 2020-09-15: 75 ug/kg/min via INTRAVENOUS

## 2020-09-15 MED ORDER — ORAL CARE MOUTH RINSE: 15.0000 mL | Freq: Once | OROMUCOSAL | Status: AC

## 2020-09-15 MED ORDER — VANCOMYCIN HCL IN DEXTROSE 1-5 GM/200ML-% IV SOLN
1000.0000 mg | INTRAVENOUS | Status: AC
  Administered 2020-09-15: 1000 mg via INTRAVENOUS
  Filled 2020-09-15: qty 200

## 2020-09-15 MED ORDER — LIDOCAINE-EPINEPHRINE (PF) 1 %-1:200000 IJ SOLN
INTRAMUSCULAR | Status: DC | PRN
  Administered 2020-09-15 (×2): 30 mL

## 2020-09-15 MED ORDER — OXYCODONE HCL 5 MG/5ML PO SOLN: 5.0000 mg | Freq: Once | ORAL | Status: DC | PRN

## 2020-09-15 MED ORDER — 0.9 % SODIUM CHLORIDE (POUR BTL) OPTIME
TOPICAL | Status: DC | PRN
  Administered 2020-09-15: 1000 mL

## 2020-09-15 MED ORDER — CHLORHEXIDINE GLUCONATE 0.12 % MT SOLN
OROMUCOSAL | Status: AC
  Administered 2020-09-15: 15 mL via OROMUCOSAL
  Filled 2020-09-15: qty 15

## 2020-09-15 MED ORDER — SODIUM CHLORIDE 0.9 % IV SOLN
INTRAVENOUS | Status: AC
  Filled 2020-09-15: qty 1.2

## 2020-09-15 MED ORDER — FENTANYL CITRATE (PF) 250 MCG/5ML IJ SOLN
INTRAMUSCULAR | Status: DC | PRN
  Administered 2020-09-15 (×2): 25 ug via INTRAVENOUS

## 2020-09-15 MED ORDER — HYDROCODONE-ACETAMINOPHEN 5-325 MG PO TABS
1.0000 | ORAL_TABLET | ORAL | 0 refills | Status: DC | PRN
Start: 2020-09-15 — End: 2021-05-16

## 2020-09-15 MED ORDER — PHENYLEPHRINE HCL-NACL 10-0.9 MG/250ML-% IV SOLN
INTRAVENOUS | Status: DC | PRN
  Administered 2020-09-15: 25 ug/min via INTRAVENOUS

## 2020-09-15 MED ORDER — SODIUM CHLORIDE 0.9 % IV SOLN: INTRAVENOUS | Status: DC | PRN

## 2020-09-15 MED ORDER — PHENYLEPHRINE HCL (PRESSORS) 10 MG/ML IV SOLN
INTRAVENOUS | Status: DC | PRN
  Administered 2020-09-15 (×2): 120 ug via INTRAVENOUS

## 2020-09-15 MED ORDER — EPHEDRINE SULFATE 50 MG/ML IJ SOLN
INTRAMUSCULAR | Status: DC | PRN
  Administered 2020-09-15: 5 mg via INTRAVENOUS

## 2020-09-15 MED ORDER — CHLORHEXIDINE GLUCONATE 0.12 % MT SOLN: 15.0000 mL | Freq: Once | OROMUCOSAL | Status: AC

## 2020-09-15 MED ORDER — SODIUM CHLORIDE 0.9 % IV SOLN
INTRAVENOUS | Status: DC | PRN
  Administered 2020-09-15: 500 mL

## 2020-09-15 SURGICAL SUPPLY — 39 items
ARMBAND PINK RESTRICT EXTREMIT (MISCELLANEOUS) ×3 IMPLANT
CANISTER SUCT 3000ML PPV (MISCELLANEOUS) ×3 IMPLANT
CLIP VESOCCLUDE MED 6/CT (CLIP) ×3 IMPLANT
CLIP VESOCCLUDE SM WIDE 6/CT (CLIP) ×6 IMPLANT
COVER PROBE W GEL 5X96 (DRAPES) ×3 IMPLANT
COVER WAND RF STERILE (DRAPES) IMPLANT
DECANTER SPIKE VIAL GLASS SM (MISCELLANEOUS) ×6 IMPLANT
DERMABOND ADVANCED (GAUZE/BANDAGES/DRESSINGS) ×2
DERMABOND ADVANCED .7 DNX12 (GAUZE/BANDAGES/DRESSINGS) ×1 IMPLANT
ELECT REM PT RETURN 9FT ADLT (ELECTROSURGICAL) ×3
ELECTRODE REM PT RTRN 9FT ADLT (ELECTROSURGICAL) ×1 IMPLANT
GLOVE BIO SURGEON STRL SZ 6.5 (GLOVE) ×4 IMPLANT
GLOVE BIO SURGEON STRL SZ7 (GLOVE) ×3 IMPLANT
GLOVE BIO SURGEON STRL SZ7.5 (GLOVE) ×6 IMPLANT
GLOVE BIO SURGEONS STRL SZ 6.5 (GLOVE) ×2
GLOVE BIOGEL PI IND STRL 6.5 (GLOVE) ×1 IMPLANT
GLOVE BIOGEL PI IND STRL 7.0 (GLOVE) ×1 IMPLANT
GLOVE BIOGEL PI IND STRL 7.5 (GLOVE) ×1 IMPLANT
GLOVE BIOGEL PI INDICATOR 6.5 (GLOVE) ×2
GLOVE BIOGEL PI INDICATOR 7.0 (GLOVE) ×2
GLOVE BIOGEL PI INDICATOR 7.5 (GLOVE) ×2
GOWN STRL REUS W/ TWL LRG LVL3 (GOWN DISPOSABLE) ×2 IMPLANT
GOWN STRL REUS W/ TWL XL LVL3 (GOWN DISPOSABLE) ×1 IMPLANT
GOWN STRL REUS W/TWL LRG LVL3 (GOWN DISPOSABLE) ×6
GOWN STRL REUS W/TWL XL LVL3 (GOWN DISPOSABLE) ×3
KIT BASIN OR (CUSTOM PROCEDURE TRAY) ×3 IMPLANT
KIT TURNOVER KIT B (KITS) ×3 IMPLANT
LOOP VESSEL MINI RED (MISCELLANEOUS) ×3 IMPLANT
NS IRRIG 1000ML POUR BTL (IV SOLUTION) ×3 IMPLANT
PACK CV ACCESS (CUSTOM PROCEDURE TRAY) ×3 IMPLANT
PAD ARMBOARD 7.5X6 YLW CONV (MISCELLANEOUS) ×6 IMPLANT
SUT MNCRL AB 4-0 PS2 18 (SUTURE) ×3 IMPLANT
SUT PROLENE 6 0 BV (SUTURE) ×6 IMPLANT
SUT VIC AB 3-0 SH 27 (SUTURE) ×6
SUT VIC AB 3-0 SH 27X BRD (SUTURE) ×2 IMPLANT
SYR BULB IRRIG 60ML STRL (SYRINGE) ×3 IMPLANT
TOWEL GREEN STERILE (TOWEL DISPOSABLE) ×3 IMPLANT
UNDERPAD 30X36 HEAVY ABSORB (UNDERPADS AND DIAPERS) ×3 IMPLANT
WATER STERILE IRR 1000ML POUR (IV SOLUTION) ×3 IMPLANT

## 2020-09-15 NOTE — Op Note (Signed)
° ° °  Patient name: Saydee Zolman Levingston MRN: [id number] DOB: 09/14/1941 Sex: female  09/15/2020 Pre-operative Diagnosis: esrd Post-operative diagnosis:  Same Surgeon:  Erlene Quan C. Donzetta Matters, MD Assistant: Leory Plowman, MS-3 Procedure Performed: Revision of right arm cephalic vein avf with transposition   Indications:  79 year old female with end-stage renal disease dialyzing via catheter.  She has a right arm fistula that is too deep for use and has multiple branches.  She is now indicated for revision with branch ligation possible transposition.  Findings: Fistula self measured 1 cm near the antecubitum with stent approximately 0.5 cm more towards the axilla.  There were multiple branches that were divided between ties.  There was one branch that was actually the size of the fistula itself retrograde filling into the forearm after dividing this the flow through the fistula was much stronger.  At completion there was a strong thrill in the superficialized fistula and a palpable radial artery pulse at the wrist.   Procedure:  The patient was identified in the holding area and taken to the operating where she was fully supine on the operative table and MAC anesthesia was induced.  She was sterilely prepped and draped in the right upper extremity in the usual fashion antibiotics were administered and a timeout was called.  We began using ultrasound to evaluate the entire fistula marked the branches on the skin with marking pen and then anesthetized the skin and subcutaneous tissue with 1% lidocaine with epinephrine.  2 longitudinal incisions were then made.  Dissection of the fistula for the entirety of his length in the upper arm dividing branches between clips and ties.  The 1 larger branch was divided between ties only.  The fistula was marked for orientation.  Was divided near the antecubitum.  We flushed with heparinized saline and tunneled it laterally flushed again and was reclamped.  We spatulated both inside and  and with 6-0 Prolene suture.  Prior to completion of flushing all directions.  Upon completion there was a very strong thrill in the fistula confirmed with Doppler.  She had a palpable radial artery pulse the wrist.  We then obtained stasis in the wound and closed in layers with Vicryl and Monocryl.  She was awakened from anesthesia having tolerated procedure without any complication.  All counts were correct at completion.  EBL: 20 cc   Jeni Duling C. Donzetta Matters, MD [facility] [place] Office: [phone] Pager: [phone]

## 2020-09-15 NOTE — Anesthesia Postprocedure Evaluation (Signed)
Anesthesia Post Note  Patient: Katie Garcia  Procedure(s) Performed: REVISON OF RIGHT UPPER ARM  ARTERIOVENOUS FISTULA (Right Arm Upper)     Patient location during evaluation: PACU Anesthesia Type: MAC Level of consciousness: awake and alert Pain management: pain level controlled Vital Signs Assessment: post-procedure vital signs reviewed and stable Respiratory status: spontaneous breathing, nonlabored ventilation and respiratory function stable Cardiovascular status: stable and blood pressure returned to baseline Anesthetic complications: no   No complications documented.  Last Vitals:  Vitals:   09/15/20 1140 09/15/20 1155  BP: (!) 167/78 140/61  Pulse: 81 70  Resp: 10 14  Temp:  (!) 36.2 C  SpO2: 100% 99%    Last Pain:  Vitals:   09/15/20 1155  TempSrc:   PainSc: 0-No pain                 Audry Pili

## 2020-09-15 NOTE — Transfer of Care (Signed)
Immediate Anesthesia Transfer of Care Note  Patient: Katie Garcia  Procedure(s) Performed: REVISON OF RIGHT UPPER ARM  ARTERIOVENOUS FISTULA (Right Arm Upper)  Patient Location: PACU  Anesthesia Type:MAC  Level of Consciousness: awake, patient cooperative and responds to stimulation  Airway & Oxygen Therapy: Patient Spontanous Breathing and Patient connected to face mask oxygen  Post-op Assessment: Report given to RN and Post -op Vital signs reviewed and stable  Post vital signs: Reviewed and stable  Last Vitals:  Vitals Value Taken Time  BP 135/61 09/15/20 1125  Temp 36.1 C 09/15/20 1125  Pulse 74 09/15/20 1127  Resp 16 09/15/20 1127  SpO2 100 % 09/15/20 1127  Vitals shown include unvalidated device data.  Last Pain:  Vitals:   09/15/20 0733  TempSrc:   PainSc: 0-No pain         Complications: No complications documented.

## 2020-09-15 NOTE — Interval H&P Note (Signed)
History and Physical Interval Note:  09/15/2020 7:27 AM  Katie Garcia  has presented today for surgery, with the diagnosis of esrd.  The various methods of treatment have been discussed with the patient and family. After consideration of risks, benefits and other options for treatment, the patient has consented to  Procedure(s): REVISON OF ARTERIOVENOUS FISTULA RIGHT (Right) as a surgical intervention.  The patient's history has been reviewed, patient examined, no change in status, stable for surgery.  I have reviewed the patient's chart and labs.  Questions were answered to the patient's satisfaction.     [name]

## 2020-09-15 NOTE — Discharge Instructions (Signed)
° °  [facility] [place] ° °Discharge Instructions ° °AV Fistula or Graft Surgery for Dialysis Access ° °Please refer to the following instructions for your post-procedure care. Your surgeon or physician assistant will discuss any changes with you. ° °Activity ° °You may drive the day following your surgery, if you are comfortable and no longer taking prescription pain medication. Resume full activity as the soreness in your incision resolves. ° °Bathing/Showering ° °You may shower after you go home. Keep your incision dry for 48 hours. Do not soak in a bathtub, hot tub, or swim until the incision heals completely. You may not shower if you have a hemodialysis catheter. ° °Incision Care ° °Clean your incision with mild soap and water after 48 hours. Pat the area dry with a clean towel. You do not need a bandage unless otherwise instructed. Do not apply any ointments or creams to your incision. You may have skin glue on your incision. Do not peel it off. It will come off on its own in about one week. Your arm may swell a bit after surgery. To reduce swelling use pillows to elevate your arm so it is above your heart. Your doctor will tell you if you need to lightly wrap your arm with an ACE bandage. ° °Diet ° °Resume your normal diet. There are not special food restrictions following this procedure. In order to heal from your surgery, it is CRITICAL to get adequate nutrition. Your body requires vitamins, minerals, and protein. Vegetables are the best source of vitamins and minerals. Vegetables also provide the perfect balance of protein. Processed food has little nutritional value, so try to avoid this. ° °Medications ° °Resume taking all of your medications. If your incision is causing pain, you may take over-the counter pain relievers such as acetaminophen (Tylenol). If you were prescribed a stronger pain medication, please be aware these medications can cause nausea and constipation. Prevent  nausea by taking the medication with a snack or meal. Avoid constipation by drinking plenty of fluids and eating foods with high amount of fiber, such as fruits, vegetables, and grains. Do not take Tylenol if you are taking prescription pain medications. ° ° ° ° °Follow up °Your surgeon may want to see you in the office following your access surgery. If so, this will be arranged at the time of your surgery. ° °Please call us immediately for any of the following conditions: ° °Increased pain, redness, drainage (pus) from your incision site °Fever of 101 degrees or higher °Severe or worsening pain at your incision site °Hand pain or numbness. ° °Reduce your risk of vascular disease: ° °Stop smoking. If you would like help, call [facility] at 1-800-QUIT-NOW (1-800-784-8669) or [facility] at 336-586-4000 ° °Manage your cholesterol °Maintain a desired weight °Control your diabetes °Keep your blood pressure down ° °Dialysis ° °It will take several weeks to several months for your new dialysis access to be ready for use. Your surgeon will determine when it is OK to use it. Your nephrologist will continue to direct your dialysis. You can continue to use your Permcath until your new access is ready for use. ° °If you have any questions, please call the office at [phone]. ° °

## 2020-09-16 ENCOUNTER — Encounter (HOSPITAL_COMMUNITY): Payer: Self-pay | Admitting: Vascular Surgery

## 2020-09-17 ENCOUNTER — Encounter: Payer: Self-pay | Admitting: Podiatry

## 2020-09-29 ENCOUNTER — Ambulatory Visit (INDEPENDENT_AMBULATORY_CARE_PROVIDER_SITE_OTHER): Payer: Medicare Other | Admitting: Endocrinology

## 2020-09-29 ENCOUNTER — Other Ambulatory Visit: Payer: Self-pay

## 2020-09-29 ENCOUNTER — Encounter: Payer: Self-pay | Admitting: Endocrinology

## 2020-09-29 VITALS — BP 100/50 | HR 91 | Ht 60.0 in | Wt 194.0 lb

## 2020-09-29 DIAGNOSIS — E1365 Other specified diabetes mellitus with hyperglycemia: Secondary | ICD-10-CM

## 2020-09-29 DIAGNOSIS — IMO0002 Reserved for concepts with insufficient information to code with codable children: Secondary | ICD-10-CM

## 2020-09-29 LAB — POCT GLYCOSYLATED HEMOGLOBIN (HGB A1C): Hemoglobin A1C: 8 % — AB (ref 4.0–5.6)

## 2020-09-29 MED ORDER — NOVOLOG MIX 70/30 FLEXPEN (70-30) 100 UNIT/ML ~~LOC~~ SUPN
PEN_INJECTOR | SUBCUTANEOUS | 11 refills | Status: DC
Start: 2020-09-29 — End: 2020-11-29

## 2020-09-29 NOTE — Progress Notes (Signed)
Subjective:    Patient ID: Katie Garcia, female    DOB: April 16, 1941, 79 y.o.   MRN: [id number]  HPI Pt returns for f/u of diabetes mellitus:  DM type: Insulin-requiring type 2 (but vitiligo suggests type 1).   Dx'ed: 3419 Complications: PN, DR, and ESRD (on HD).  Therapy: insulin since 2004.   GDM: 1970 DKA: never.   Severe hypoglycemia: last episode was 2017.    Pancreatitis: never.   Pancreatic imaging: never.  SDOH: she takes human insulin, due to cost.  On HD days (morning), she takes insulin with breakfast when she gets home.  Other: she takes QD insulin, after poor results with multiple daily injections; she was changed to 70/30 qam, due to pattern of cbg's; fructosamine has confirmed A1c.   Interval history: she brings her meter with her cbg's which I have reviewed today.  cbg varies from 42-405.  It is in general higher on HD days than days off, and lowest after lunch is missed or delayed.  pt states she feels well in general.  Pt says she never misses the insulin.  No recent steroids.   Past Medical History:  Diagnosis Date  . Anemia   . Arthritis   . Chronic kidney disease    Dialysis T/Th/Sa at [place]  . Diabetes mellitus without complication ([place])   . GERD (gastroesophageal reflux disease)   . Gout   . Headache   . Hypertension   . Type 2 diabetes mellitus ([place])     Past Surgical History:  Procedure Laterality Date  . ABDOMINAL HYSTERECTOMY    . AV FISTULA PLACEMENT Left 10/02/2016   Procedure: ARTERIOVENOUS (AV) FISTULA CREATION LEFT UPPER ARM;  Surgeon: Waynetta Sandy, MD;  Location: [facility];  Service: Vascular;  Laterality: Left;  . AV FISTULA PLACEMENT Right 07/14/2020   Procedure: RIGHT ARM ARTERIOVENOUS FISTULA CREATION;  Surgeon: Waynetta Sandy, MD;  Location: [place];  Service: Vascular;  Laterality: Right;  . BASCILIC VEIN TRANSPOSITION Left 05/17/2020   Procedure: Insertion of Left arm arteriovenous gortex graftarm ;  Surgeon: Angelia Mould, MD;  Location: [facility] OR;  Service: Vascular;  Laterality: Left;  . CESAREAN SECTION    . DIALYSIS/PERMA CATHETER INSERTION    . REVISON OF ARTERIOVENOUS FISTULA Right 09/15/2020   Procedure: REVISON OF RIGHT UPPER ARM  ARTERIOVENOUS FISTULA;  Surgeon: Waynetta Sandy, MD;  Location: [place];  Service: Vascular;  Laterality: Right;    Social History   Socioeconomic History  . Marital status: Married    Spouse name: Not on file  . Number of children: Not on file  . Years of education: Not on file  . Highest education level: Not on file  Occupational History  . Not on file  Tobacco Use  . Smoking status: Never Smoker  . Smokeless tobacco: Never Used  Vaping Use  . Vaping Use: Never used  Substance and Sexual Activity  . Alcohol use: Not Currently  . Drug use: No  . Sexual activity: Not on file  Other Topics Concern  . Not on file  Social History Narrative  . Not on file   Social Determinants of Health   Financial Resource Strain:   . Difficulty of Paying Living Expenses: Not on file  Food Insecurity:   . Worried About Charity fundraiser in the Last Year: Not on file  . Ran Out of Food in the Last Year: Not on file  Transportation Needs:   .  Lack of Transportation (Medical): Not on file  . Lack of Transportation (Non-Medical): Not on file  Physical Activity:   . Days of Exercise per Week: Not on file  . Minutes of Exercise per Session: Not on file  Stress:   . Feeling of Stress : Not on file  Social Connections:   . Frequency of Communication with Friends and Family: Not on file  . Frequency of Social Gatherings with Friends and Family: Not on file  . Attends Religious Services: Not on file  . Active Member of Clubs or Organizations: Not on file  . Attends Archivist Meetings: Not on file  . Marital Status: Not on file  Intimate Partner Violence:   . Fear of Current or Ex-Partner: Not on file  . Emotionally Abused: Not on file  .  Physically Abused: Not on file  . Sexually Abused: Not on file    Current Outpatient Medications on File Prior to Visit  Medication Sig Dispense Refill  . ACCU-CHEK GUIDE test strip 1 each by Other route 4 (four) times daily. E11.22  1  . acetaminophen (TYLENOL) 500 MG tablet Take 1,000 mg by mouth every 6 (six) hours as needed for moderate pain or headache.    . allopurinol (ZYLOPRIM) 100 MG tablet Take 100 mg by mouth daily.     . B Complex-C-Zn-Folic Acid (DIALYVITE/ZINC) TABS Take 1 tablet by mouth every evening.   3  . calcium acetate (PHOSLO) 667 MG capsule Take 1,334 mg by mouth 2 (two) times daily with a meal.   3  . Calcium Carbonate Antacid (TUMS E-X PO) Take 2 tablets by mouth daily as needed (acid reflux).     . carvedilol (COREG) 25 MG tablet Take 25 mg by mouth in the morning and at bedtime.     . famotidine (PEPCID) 20 MG tablet Take 20 mg by mouth at bedtime.    Marland Kitchen guaiFENesin (MUCINEX) 600 MG 12 hr tablet Take 600 mg by mouth daily as needed for to loosen phlegm.    Marland Kitchen guaifenesin (ROBITUSSIN) 100 MG/5ML syrup Take 100 mg by mouth 3 (three) times daily as needed for cough.    Marland Kitchen HYDROcodone-acetaminophen (NORCO/VICODIN) 5-325 MG tablet Take 1 tablet by mouth every 4 (four) hours as needed for moderate pain. 20 tablet 0  . losartan (COZAAR) 50 MG tablet Take 50 mg by mouth daily.     . Methoxy PEG-Epoetin Beta (MIRCERA IJ) Apply 1 application topically once a week.     Marland Kitchen RELION INSULIN SYR 0.5ML/31G 31G X 5/16" 0.5 ML MISC USE 1 ONCE DAILY    . rosuvastatin (CRESTOR) 10 MG tablet Take 10 mg by mouth daily.     Marland Kitchen triamcinolone cream (KENALOG) 0.1 % Apply 1 application topically daily as needed (rash).      No current facility-administered medications on file prior to visit.    Allergies  Allergen Reactions  . Penicillins Rash    Did it involve swelling of the face/tongue/throat, SOB, or low BP? No Did it involve sudden or severe rash/hives, skin peeling, or any reaction on  the inside of your mouth or nose? Yes Did you need to seek medical attention at a hospital or doctor's office? N/A When did it last happen?N/A If all above answers are "NO", may proceed with cephalosporin use.    Family History  Problem Relation Age of Onset  . Diabetes Mother     BP (!) 100/50   Pulse 91   Ht 5' (  1.524 m)   Wt 194 lb (88 kg)   SpO2 97%   BMI 37.89 kg/m    Review of Systems Denies LOC    Objective:   Physical Exam VITAL SIGNS:  See vs page GENERAL: no distress Pulses: dorsalis pedis intact bilat.   MSK: no deformity of the feet CV: no leg edema Skin:  no ulcer on the feet.  normal color and temp on the feet.  Neuro: sensation is intact to touch on the feet, but decreased from normal.   Lab Results  Component Value Date   HGBA1C 8.0 (A) 09/29/2020       Assessment & Plan:  Insulin-requiring type 2 DM, with ESRD: we have to adjust insulin for HD days vs days off Hypoglycemia, due to insulin: this limits aggressiveness of glycemic control.   Patient Instructions  check your blood sugar twice a day.  vary the time of day when you check, between before the 3 meals, and at bedtime.  also check if you have symptoms of your blood sugar being too high or too low.  please keep a record of the readings and bring it to your next appointment here (or you can bring the meter itself).  You can write it on any piece of paper.  please call us sooner if your blood sugar goes below 70, or if you have a lot of readings over 200. Please change the insulin to novolog 70/30 (pharmacist at [facility] will help you), 30 units with breakfast on dialysis days, and 40 units on days off.   On this type of insulin schedule, you should eat meals on a regular schedule (especially lunch).  If a meal is missed or significantly delayed, your blood sugar could go low.  Please come back for a follow-up appointment in 2 months.

## 2020-09-29 NOTE — Patient Instructions (Addendum)
check your blood sugar twice a day.  vary the time of day when you check, between before the 3 meals, and at bedtime.  also check if you have symptoms of your blood sugar being too high or too low.  please keep a record of the readings and bring it to your next appointment here (or you can bring the meter itself).  You can write it on any piece of paper.  please call us sooner if your blood sugar goes below 70, or if you have a lot of readings over 200. Please change the insulin to novolog 70/30 (pharmacist at [facility] will help you), 30 units with breakfast on dialysis days, and 40 units on days off.   On this type of insulin schedule, you should eat meals on a regular schedule (especially lunch).  If a meal is missed or significantly delayed, your blood sugar could go low.  Please come back for a follow-up appointment in 2 months.

## 2020-10-01 ENCOUNTER — Encounter: Payer: Self-pay | Admitting: Physician Assistant

## 2020-10-01 ENCOUNTER — Ambulatory Visit (INDEPENDENT_AMBULATORY_CARE_PROVIDER_SITE_OTHER): Payer: Self-pay | Admitting: Physician Assistant

## 2020-10-01 ENCOUNTER — Other Ambulatory Visit: Payer: Self-pay

## 2020-10-01 VITALS — BP 110/68 | HR 84 | Temp 98.5°F | Resp 20 | Ht 60.0 in | Wt 193.1 lb

## 2020-10-01 DIAGNOSIS — N186 End stage renal disease: Secondary | ICD-10-CM

## 2020-10-01 NOTE — Progress Notes (Signed)
POST OPERATIVE OFFICE NOTE    CC:  F/u for surgery  HPI:  This is a 79 y.o. female who is s/p Revision of right arm cephalic vein avf with transposition on 09/15/2020 by Dr. Donzetta Matters.  The fistula was originally created by Dr. Donzetta Matters on 07/14/2020.  Pt returns today for follow up.  She denise pain, loss of motor and loss of sensation.  No symptoms of steal.      Allergies  Allergen Reactions  . Penicillins Rash    Did it involve swelling of the face/tongue/throat, SOB, or low BP? No Did it involve sudden or severe rash/hives, skin peeling, or any reaction on the inside of your mouth or nose? Yes Did you need to seek medical attention at a hospital or doctor's office? N/A When did it last happen?N/A If all above answers are "NO", may proceed with cephalosporin use.    Current Outpatient Medications  Medication Sig Dispense Refill  . ACCU-CHEK GUIDE test strip 1 each by Other route 4 (four) times daily. E11.22  1  . acetaminophen (TYLENOL) 500 MG tablet Take 1,000 mg by mouth every 6 (six) hours as needed for moderate pain or headache.    . allopurinol (ZYLOPRIM) 100 MG tablet Take 100 mg by mouth daily.     . B Complex-C-Zn-Folic Acid (DIALYVITE/ZINC) TABS Take 1 tablet by mouth every evening.   3  . calcium acetate (PHOSLO) 667 MG capsule Take 1,334 mg by mouth 2 (two) times daily with a meal.   3  . Calcium Carbonate Antacid (TUMS E-X PO) Take 2 tablets by mouth daily as needed (acid reflux).     . carvedilol (COREG) 25 MG tablet Take 25 mg by mouth in the morning and at bedtime.     . famotidine (PEPCID) 20 MG tablet Take 20 mg by mouth at bedtime.    Marland Kitchen guaiFENesin (MUCINEX) 600 MG 12 hr tablet Take 600 mg by mouth daily as needed for to loosen phlegm.    Marland Kitchen guaifenesin (ROBITUSSIN) 100 MG/5ML syrup Take 100 mg by mouth 3 (three) times daily as needed for cough.    Marland Kitchen HYDROcodone-acetaminophen (NORCO/VICODIN) 5-325 MG tablet Take 1 tablet by mouth every 4 (four) hours as needed for  moderate pain. 20 tablet 0  . insulin aspart protamine - aspart (NOVOLOG MIX 70/30 FLEXPEN) (70-30) 100 UNIT/ML FlexPen 30 units with breakfast on dialysis days, and 40 units on days off. 15 mL 11  . losartan (COZAAR) 50 MG tablet Take 50 mg by mouth daily.     . Methoxy PEG-Epoetin Beta (MIRCERA IJ) Apply 1 application topically once a week.     Marland Kitchen RELION INSULIN SYR 0.5ML/31G 31G X 5/16" 0.5 ML MISC USE 1 ONCE DAILY    . rosuvastatin (CRESTOR) 10 MG tablet Take 10 mg by mouth daily.     Marland Kitchen triamcinolone cream (KENALOG) 0.1 % Apply 1 application topically daily as needed (rash).      No current facility-administered medications for this visit.     ROS:  See HPI  Physical Exam:    Incision:  Healing well  Extremities:  Palpable thrill in the fistula, palpable radial, grip 5/5 equal B UE Lungs: non labored breathing   Assessment/Plan:  This is a 79 y.o. female who is s/p:  Revision of right arm cephalic vein avf with transposition   The fistula may be used as of 10/15/2020.  Once the fistula is working well she can be scheduled for [facility] removal that was  placed by [facility].  F/U with [place] as needed.    Roxy Horseman PA-C [facility] [phone]  Clinic MD:  Donzetta Matters

## 2020-10-29 ENCOUNTER — Emergency Department (HOSPITAL_COMMUNITY): Payer: Medicare Other

## 2020-10-29 ENCOUNTER — Emergency Department (HOSPITAL_COMMUNITY)
Admission: EM | Admit: 2020-10-29 | Discharge: 2020-10-29 | Disposition: A | Discharge status: Home / Self Care | Payer: Medicare Other | Attending: Emergency Medicine | Admitting: Emergency Medicine

## 2020-10-29 DIAGNOSIS — I120 Hypertensive chronic kidney disease with stage 5 chronic kidney disease or end stage renal disease: Secondary | ICD-10-CM | POA: Insufficient documentation

## 2020-10-29 DIAGNOSIS — Y92012 Bathroom of single-family (private) house as the place of occurrence of the external cause: Secondary | ICD-10-CM | POA: Insufficient documentation

## 2020-10-29 DIAGNOSIS — E1122 Type 2 diabetes mellitus with diabetic chronic kidney disease: Secondary | ICD-10-CM | POA: Diagnosis not present

## 2020-10-29 DIAGNOSIS — M25552 Pain in left hip: Secondary | ICD-10-CM | POA: Diagnosis not present

## 2020-10-29 DIAGNOSIS — Z992 Dependence on renal dialysis: Secondary | ICD-10-CM | POA: Insufficient documentation

## 2020-10-29 DIAGNOSIS — Z794 Long term (current) use of insulin: Secondary | ICD-10-CM | POA: Insufficient documentation

## 2020-10-29 DIAGNOSIS — Z79899 Other long term (current) drug therapy: Secondary | ICD-10-CM | POA: Diagnosis not present

## 2020-10-29 DIAGNOSIS — W01198A Fall on same level from slipping, tripping and stumbling with subsequent striking against other object, initial encounter: Secondary | ICD-10-CM | POA: Insufficient documentation

## 2020-10-29 DIAGNOSIS — N186 End stage renal disease: Secondary | ICD-10-CM | POA: Diagnosis not present

## 2020-10-29 DIAGNOSIS — E213 Hyperparathyroidism, unspecified: Secondary | ICD-10-CM | POA: Diagnosis not present

## 2020-10-29 DIAGNOSIS — W19XXXA Unspecified fall, initial encounter: Secondary | ICD-10-CM

## 2020-10-29 NOTE — ED Triage Notes (Signed)
Pt states she was in the bathroom washing her self off and she slipped and fell into the wall hitting her left side and into the floor. She does not believe she hit her head or had any LOC. She is having left hip and left arm pain.

## 2020-10-29 NOTE — ED Provider Notes (Signed)
[facility] EMERGENCY DEPARTMENT Provider Note   CSN: [id number] Arrival date & time: 10/29/20  1058     History Chief Complaint  Patient presents with  . Fall    Katie Garcia is a 79 y.o. female presenting for evaluation after fall.  Patient states 3 days ago she slipped in her bathroom and fell, hitting her left hip and left shoulder.  She not hit her head or lose consciousness.  She not fall to the ground, fell into the door instead.  She reports she had no pain initially, however last night she had some stiffness in her left hip. This does not radiate. Nothing makes it worse.  She took Tylenol which improved her pain.  Her hip pain today is significantly less than yesterday as she has been moving more.  She has not taken any medicine today.  She is ambulatory without assistance.  She lives at home with her daughter.  She is not on blood thinners.  She denies headache, neck pain, back pain, nausea, vomiting abdominal pain, loss of about control, numbness, tingling.  She is not on blood thinners.  Additionally, patient reports mild left shoulder soreness, but states this has been improving each day since the fall.  HPI     Past Medical History:  Diagnosis Date  . Anemia   . Arthritis   . Chronic kidney disease    Dialysis T/Th/Sa at [place]  . Diabetes mellitus without complication ([place])   . GERD (gastroesophageal reflux disease)   . Gout   . Headache   . Hypertension   . Type 2 diabetes mellitus [facility])     Patient Active Problem List   Diagnosis Date Noted  . Pneumonia due to COVID-19 virus 12/20/2019  . COVID-19 virus infection 12/16/2019  . ESRD (end stage renal disease) ([place]) 12/16/2019  . Acute metabolic encephalopathy 27/78/2423  . Chronic kidney disease (CKD), stage V ([place]) 10/28/2018  . Gout 10/28/2018  . HLD (hyperlipidemia) 10/28/2018  . Hyperparathyroidism ([place]) 10/28/2018  . Pelvic kidney 10/28/2018  . Pre-transplant evaluation for  CKD (chronic kidney disease) 10/28/2018  . Proteinuria 10/28/2018  . Vertigo 10/28/2018  . Ingrown nail of great toe of left foot 10/10/2018  . Murmur, cardiac 04/02/2018  . Iron deficiency anemia 02/23/2017  . Anemia in chronic kidney disease 02/09/2017  . Drug noncompliance 03/04/2014  . Diarrhea 03/04/2014  . Acute renal failure ([place]) 03/04/2014  . DM (diabetes mellitus), secondary uncontrolled ([place]) 03/04/2014  . ARF (acute renal failure) ([place]) 03/04/2014  . Renal failure, acute ([place]) 03/04/2014    Past Surgical History:  Procedure Laterality Date  . ABDOMINAL HYSTERECTOMY    . AV FISTULA PLACEMENT Left 10/02/2016   Procedure: ARTERIOVENOUS (AV) FISTULA CREATION LEFT UPPER ARM;  Surgeon: Waynetta Sandy, MD;  Location: [place];  Service: Vascular;  Laterality: Left;  . AV FISTULA PLACEMENT Right 07/14/2020   Procedure: RIGHT ARM ARTERIOVENOUS FISTULA CREATION;  Surgeon: Waynetta Sandy, MD;  Location: [place];  Service: Vascular;  Laterality: Right;  . BASCILIC VEIN TRANSPOSITION Left 05/17/2020   Procedure: Insertion of Left arm arteriovenous gortex graftarm ;  Surgeon: Angelia Mould, MD;  Location: [facility];  Service: Vascular;  Laterality: Left;  . CESAREAN SECTION    . DIALYSIS/PERMA CATHETER INSERTION    . REVISON OF ARTERIOVENOUS FISTULA Right 09/15/2020   Procedure: REVISON OF RIGHT UPPER ARM  ARTERIOVENOUS FISTULA;  Surgeon: Waynetta Sandy, MD;  Location: [place];  Service: Vascular;  Laterality: Right;     OB History   No obstetric history on file.     Family History  Problem Relation Age of Onset  . Diabetes Mother     Social History   Tobacco Use  . Smoking status: Never Smoker  . Smokeless tobacco: Never Used  Vaping Use  . Vaping Use: Never used  Substance Use Topics  . Alcohol use: Not Currently  . Drug use: No    Home Medications Prior to Admission medications   Medication Sig Start Date End Date Taking? Authorizing  Provider  ACCU-CHEK GUIDE test strip 1 each by Other route 4 (four) times daily. E11.22 10/06/18   [provider]  acetaminophen (TYLENOL) 500 MG tablet Take 1,000 mg by mouth every 6 (six) hours as needed for moderate pain or headache.    [provider]  allopurinol (ZYLOPRIM) 100 MG tablet Take 100 mg by mouth daily.     [provider]  B Complex-C-Zn-Folic Acid (DIALYVITE/ZINC) TABS Take 1 tablet by mouth every evening.  10/25/18   [provider]  calcium acetate (PHOSLO) 667 MG capsule Take 1,334 mg by mouth 2 (two) times daily with a meal.  10/24/18   [provider]  Calcium Carbonate Antacid (TUMS E-X PO) Take 2 tablets by mouth daily as needed (acid reflux).     [provider]  carvedilol (COREG) 25 MG tablet Take 25 mg by mouth in the morning and at bedtime.     [provider]  famotidine (PEPCID) 20 MG tablet Take 20 mg by mouth at bedtime.    [provider]  guaiFENesin (MUCINEX) 600 MG 12 hr tablet Take 600 mg by mouth daily as needed for to loosen phlegm.    [provider]  guaifenesin (ROBITUSSIN) 100 MG/5ML syrup Take 100 mg by mouth 3 (three) times daily as needed for cough.    [provider]  HYDROcodone-acetaminophen (NORCO/VICODIN) 5-325 MG tablet Take 1 tablet by mouth every 4 (four) hours as needed for moderate pain. 09/15/20 09/15/21  Setzer, [address], PA-C  insulin aspart protamine - aspart (NOVOLOG MIX 70/30 FLEXPEN) (70-30) 100 UNIT/ML FlexPen 30 units with breakfast on dialysis days, and 40 units on days off. 09/29/20   Renato Shin, MD  losartan (COZAAR) 50 MG tablet Take 50 mg by mouth daily.     [provider]  Methoxy PEG-Epoetin Beta (MIRCERA IJ) Apply 1 application topically once a week.  01/29/20 01/27/21  [provider]  [place] 0.5ML/31G 31G X 5/16" 0.5 ML MISC USE 1 ONCE DAILY 03/05/19   [provider]  rosuvastatin (CRESTOR) 10 MG  tablet Take 10 mg by mouth daily.     [provider]  triamcinolone cream (KENALOG) 0.1 % Apply 1 application topically daily as needed (rash).  05/28/19   [provider]    Allergies    Penicillins  Review of Systems   Review of Systems  Musculoskeletal: Positive for arthralgias.  All other systems reviewed and are negative.   Physical Exam Updated Vital Signs BP 136/79   Pulse 96   Temp 99.8 F (37.7 C) (Oral)   Resp 18   Ht 5' (1.524 m)   Wt 88.5 kg   SpO2 100%   BMI 38.08 kg/m   Physical Exam Vitals and nursing note reviewed.  Constitutional:      General: She is not in acute distress.    Appearance: She is well-developed.  Comments: Appears nontoxic  HENT:     Head: Normocephalic and atraumatic.     Comments: No external signs of head trauma. No ttp Eyes:     Conjunctiva/sclera: Conjunctivae normal.     Pupils: Pupils are equal, round, and reactive to light.  Neck:     Comments: No ttp of midline c-spine. No step offs or deformities. No pain with mvt of the head Cardiovascular:     Rate and Rhythm: Normal rate and regular rhythm.     Pulses: Normal pulses.  Pulmonary:     Effort: Pulmonary effort is normal. No respiratory distress.     Breath sounds: Normal breath sounds. No wheezing.  Abdominal:     General: There is no distension.     Palpations: Abdomen is soft. There is no mass.     Tenderness: There is no abdominal tenderness. There is no guarding or rebound.  Musculoskeletal:        General: No tenderness. Normal range of motion.     Cervical back: Normal range of motion and neck supple.     Comments: No obvious deformity of the hip.  No leg shortening or external rotation.  Pelvis stable and intact.  Patient able to go from laying to sitting without significant pain or discomfort.  No tenderness palpation over midline spine.  No tenderness palpation of the left shoulder or upper arm. Pt ambulatory.   Skin:    General: Skin is  warm and dry.     Capillary Refill: Capillary refill takes less than 2 seconds.  Neurological:     Mental Status: She is alert and oriented to person, place, and time.     ED Results / Procedures / Treatments   Labs (all labs ordered are listed, but only abnormal results are displayed) Labs Reviewed - No data to display  EKG None  Radiology DG Hip Unilat With Pelvis 2-3 Views Left  Result Date: 10/29/2020 CLINICAL DATA:  Fall.  Hip and groin pain. EXAM: DG HIP (WITH OR WITHOUT PELVIS) 2-3V LEFT COMPARISON:  Left hip radiographs 09/17/2007 and MRI 10/05/2007 FINDINGS: No definite acute fracture or hip dislocation is identified. Slight irregularity of the left superior and inferior pubic rami are most compatible with healed fractures which were present on the remote MRI. Left hip joint space width is preserved. Enthesophytes are noted at both greater trochanters. Lumbar spondylosis is noted. IMPRESSION: No acute osseous abnormality identified. Electronically Signed   By: Logan Bores M.D.   On: 10/29/2020 11:43    Procedures Procedures (including critical care time)  Medications Ordered in ED Medications - No data to display  ED Course  I have reviewed the triage vital signs and the nursing notes.  Pertinent labs & imaging results that were available during my care of the patient were reviewed by me and considered in my medical decision making (see chart for details).    MDM Rules/Calculators/A&P                          Patient presented for evaluation of left hip pain after a fall a few days ago.  On exam, patient is nontoxic.  She is neurovascularly intact.  No obvious deformity.  X-ray obtained in triage read interpreted by me, no acute fracture dislocation.  Per radiology, there are signs of healing fractures which are present on a previous MRI.  As patient symptoms are improved today, I do not feel she needs further imaging  with a CT or MRI.  Discussed findings with patient.   Discussed continued symptomatic treatment with Tylenol and muscle creams as factors as needed.  Case discussed with attending, Dr. Francia Greaves evaluated the patient.  At this time, patient appears safe for discharge.  Return precautions given.  Patient states she understands and agrees to plan.  Final Clinical Impression(s) / ED Diagnoses Final diagnoses:  Left hip pain  Fall, initial encounter    Rx / DC Orders ED Discharge Orders    None       Franchot Heidelberg, PA-C 10/29/20 1347    Valarie Merino, MD 11/02/20 [phone]

## 2020-10-29 NOTE — Discharge Instructions (Addendum)
Continue taking home medications as prescribed. Take Tylenol as needed for pain. Use muscle creams/patches such as Salonpas, icy hot, BenGay, Biofreeze to help with pain. You likely have continued stiffness and soreness over the next several days.  Follow-up with your primary care doctor if your symptoms not proving. Return to the emergency room if you develop severe worsening pain, numbness, loss of bowel bladder control, inability to walk, or any new, worsening, or concerning symptoms.

## 2020-11-01 ENCOUNTER — Encounter: Payer: Self-pay | Admitting: Podiatry

## 2020-11-01 ENCOUNTER — Ambulatory Visit: Payer: Medicare Other | Admitting: Podiatry

## 2020-11-01 ENCOUNTER — Other Ambulatory Visit: Payer: Self-pay

## 2020-11-01 DIAGNOSIS — M79674 Pain in right toe(s): Secondary | ICD-10-CM

## 2020-11-01 DIAGNOSIS — L84 Corns and callosities: Secondary | ICD-10-CM

## 2020-11-01 DIAGNOSIS — N186 End stage renal disease: Secondary | ICD-10-CM

## 2020-11-01 DIAGNOSIS — E0822 Diabetes mellitus due to underlying condition with diabetic chronic kidney disease: Secondary | ICD-10-CM

## 2020-11-01 DIAGNOSIS — Z794 Long term (current) use of insulin: Secondary | ICD-10-CM

## 2020-11-01 DIAGNOSIS — M79675 Pain in left toe(s): Secondary | ICD-10-CM | POA: Diagnosis not present

## 2020-11-01 DIAGNOSIS — Z992 Dependence on renal dialysis: Secondary | ICD-10-CM

## 2020-11-01 DIAGNOSIS — B351 Tinea unguium: Secondary | ICD-10-CM

## 2020-11-06 NOTE — Progress Notes (Signed)
Subjective: Katie Garcia presents today at risk foot care. Pt has h/o NIDDM with chronic kidney disease and callus(es) b/l feet and painful mycotic toenails b/l that are difficult to trim. Pain interferes with ambulation. Aggravating factors include wearing enclosed shoe gear. Pain is relieved with periodic professional debridement.  She states she had a fall in the bathroom Tuesday morning and went to ED. She injured her left shoulder, but no fracture was found.  She voices no new pedal problems on today's visit. She continues dialysis treatment on TTS.  [name], MD is patient's PCP.She has scheduled an appointment.  Past Medical History:  Diagnosis Date  . Anemia   . Arthritis   . Chronic kidney disease    Dialysis T/Th/Sa at [facility]  . Diabetes mellitus without complication ([place])   . GERD (gastroesophageal reflux disease)   . Gout   . Headache   . Hypertension   . Type 2 diabetes mellitus ([place])      Current Outpatient Medications on File Prior to Visit  Medication Sig Dispense Refill  . ACCU-CHEK GUIDE test strip 1 each by Other route 4 (four) times daily. E11.22  1  . acetaminophen (TYLENOL) 500 MG tablet Take 1,000 mg by mouth every 6 (six) hours as needed for moderate pain or headache.    . allopurinol (ZYLOPRIM) 100 MG tablet Take 100 mg by mouth daily.     . B Complex-C-Zn-Folic Acid (DIALYVITE/ZINC) TABS Take 1 tablet by mouth every evening.   3  . calcium acetate (PHOSLO) 667 MG capsule Take 1,334 mg by mouth 2 (two) times daily with a meal.   3  . Calcium Carbonate Antacid (TUMS E-X PO) Take 2 tablets by mouth daily as needed (acid reflux).     . carvedilol (COREG) 25 MG tablet Take 25 mg by mouth in the morning and at bedtime.     . famotidine (PEPCID) 20 MG tablet Take 20 mg by mouth at bedtime.    Marland Kitchen guaiFENesin (MUCINEX) 600 MG 12 hr tablet Take 600 mg by mouth daily as needed for to loosen phlegm.    Marland Kitchen guaifenesin (ROBITUSSIN) 100 MG/5ML syrup Take  100 mg by mouth 3 (three) times daily as needed for cough.    Marland Kitchen HYDROcodone-acetaminophen (NORCO/VICODIN) 5-325 MG tablet Take 1 tablet by mouth every 4 (four) hours as needed for moderate pain. 20 tablet 0  . insulin aspart protamine - aspart (NOVOLOG MIX 70/30 FLEXPEN) (70-30) 100 UNIT/ML FlexPen 30 units with breakfast on dialysis days, and 40 units on days off. 15 mL 11  . losartan (COZAAR) 50 MG tablet Take 50 mg by mouth daily.     . Methoxy PEG-Epoetin Beta (MIRCERA IJ) Apply 1 application topically once a week.     Marland Kitchen RELION INSULIN SYR 0.5ML/31G 31G X 5/16" 0.5 ML MISC USE 1 ONCE DAILY    . rosuvastatin (CRESTOR) 10 MG tablet Take 10 mg by mouth daily.     Marland Kitchen triamcinolone cream (KENALOG) 0.1 % Apply 1 application topically daily as needed (rash).      No current facility-administered medications on file prior to visit.     Allergies  Allergen Reactions  . Penicillins Rash    Did it involve swelling of the face/tongue/throat, SOB, or low BP? No Did it involve sudden or severe rash/hives, skin peeling, or any reaction on the inside of your mouth or nose? Yes Did you need to seek medical attention at a hospital or doctor's office? N/A When  did it last happen?N/A If all above answers are "NO", may proceed with cephalosporin use.    Objective: Katie Garcia is a pleasant 79 y.o.  AAF in NAD. AAO x 3.  There were no vitals filed for this visit.  Vascular Examination: Neurovascular status unchanged b/l. Capillary fill time to digits <3 seconds b/l. Palpable DP pulses b/l. Faintly palpable PT pulses b/l. Pedal hair absent b/l Skin temperature gradient within normal limits b/l. Nonpitting edema noted b/l LE.  Dermatological Examination: Pedal skin with normal turgor, texture and tone bilaterally. No open wounds bilaterally. No interdigital macerations bilaterally. Toenails 1-5 b/l elongated, dystrophic, thickened, crumbly with subungual debris and tenderness to dorsal palpation.  Hyperkeratotic lesion(s) submet head 5 left foot and submet head 5 right foot.  No erythema, no edema, no drainage, no flocculence.  Musculoskeletal: Normal muscle strength 5/5 to all lower extremity muscle groups bilaterally. No pain crepitus or joint limitation noted with ROM b/l.  Neurological Examination: Protective sensation intact 5/5 intact bilaterally with 10g monofilament b/l. Vibratory sensation intact b/l.  Assessment: 1. Pain due to onychomycosis of toenails of both feet   2. Callus   3. Diabetes mellitus due to underlying condition with chronic kidney disease on chronic dialysis, with long-term current use of insulin ([place])    Plan: -Examined patient. -No new findings. No new orders. -Continue diabetic foot care principles. Literature dispensed on today.  -Toenails 1-5 b/l were debrided in length and girth with sterile nail nippers and dremel without iatrogenic bleeding.  -Callus(es) submet head 5 left foot and submet head 5 right foot pared utilizing sterile scalpel blade without complication or incident. Total number debrided =2. -Patient to continue soft, supportive shoe gear daily. -Patient to report any pedal injuries to medical professional immediately. -Patient/POA to call should there be question/concern in the interim.  Return in about 3 months (around 02/01/2021) for diabetic toenails, corn(s)/callus(es).  Marzetta Board, DPM

## 2020-11-15 ENCOUNTER — Ambulatory Visit: Payer: Medicare Other | Admitting: Podiatry

## 2020-11-29 ENCOUNTER — Encounter: Payer: Self-pay | Admitting: Endocrinology

## 2020-11-29 ENCOUNTER — Other Ambulatory Visit: Payer: Self-pay

## 2020-11-29 ENCOUNTER — Ambulatory Visit (INDEPENDENT_AMBULATORY_CARE_PROVIDER_SITE_OTHER): Payer: Medicare Other | Admitting: Endocrinology

## 2020-11-29 VITALS — BP 138/74 | HR 94 | Ht 60.0 in | Wt 193.6 lb

## 2020-11-29 DIAGNOSIS — E1365 Other specified diabetes mellitus with hyperglycemia: Secondary | ICD-10-CM | POA: Diagnosis not present

## 2020-11-29 DIAGNOSIS — IMO0002 Reserved for concepts with insufficient information to code with codable children: Secondary | ICD-10-CM

## 2020-11-29 LAB — POCT GLYCOSYLATED HEMOGLOBIN (HGB A1C): Hemoglobin A1C: 8.1 % — AB (ref 4.0–5.6)

## 2020-11-29 MED ORDER — INSULIN NPH ISOPHANE & REGULAR (70-30) 100 UNIT/ML ~~LOC~~ SUSP: SUBCUTANEOUS | 11 refills | Status: DC

## 2020-11-29 NOTE — Progress Notes (Signed)
Subjective:    Patient ID: Katie Garcia, female    DOB: 07/16/1941, 79 y.o.   MRN: [id number]  HPI Pt returns for f/u of diabetes mellitus:  DM type: Insulin-requiring type 2 (but vitiligo suggests type 1).   Dx'ed: 0539 Complications: PN, DR, and ESRD (on HD).  Therapy: insulin since 2004.   GDM: 1970 DKA: never.   Severe hypoglycemia: last episode was 2017.    Pancreatitis: never.   Pancreatic imaging: never.  SDOH: she takes human insulin, due to cost.  On HD days (morning), she takes insulin with breakfast when she gets home.  Other: she takes QD insulin, after poor results with multiple daily injections; she was changed to 70/30 qam, due to pattern of cbg's; fructosamine has confirmed A1c.   Interval history: no cbg record, but states cbg varies from 54-500.  It is in general lower on HD days than days off.  It is in general higher as the day goes on.  pt states she feels well in general.  Pt says she never misses the insulin, but she sometimes does not take until the evening.  No recent steroids.  She takes 30 units with breakfast on dialysis days, and 35 units on days off.  Pt says she has to buy insulin at [facility], for best price. Past Medical History:  Diagnosis Date  . Anemia   . Arthritis   . Chronic kidney disease    Dialysis T/Th/Sa at [facility]  . Diabetes mellitus without complication ([place])   . GERD (gastroesophageal reflux disease)   . Gout   . Headache   . Hypertension   . Type 2 diabetes mellitus ([place])     Past Surgical History:  Procedure Laterality Date  . ABDOMINAL HYSTERECTOMY    . AV FISTULA PLACEMENT Left 10/02/2016   Procedure: ARTERIOVENOUS (AV) FISTULA CREATION LEFT UPPER ARM;  Surgeon: Waynetta Sandy, MD;  Location: [facility];  Service: Vascular;  Laterality: Left;  . AV FISTULA PLACEMENT Right 07/14/2020   Procedure: RIGHT ARM ARTERIOVENOUS FISTULA CREATION;  Surgeon: Waynetta Sandy, MD;  Location: [facility];  Service: Vascular;   Laterality: Right;  . BASCILIC VEIN TRANSPOSITION Left 05/17/2020   Procedure: Insertion of Left arm arteriovenous gortex graftarm ;  Surgeon: Angelia Mould, MD;  Location[facility] OR;  Service: Vascular;  Laterality: Left;  . CESAREAN SECTION    . DIALYSIS/PERMA CATHETER INSERTION    . REVISON OF ARTERIOVENOUS FISTULA Right 09/15/2020   Procedure: REVISON OF RIGHT UPPER ARM  ARTERIOVENOUS FISTULA;  Surgeon: Waynetta Sandy, MD;  Location: [facility];  Service: Vascular;  Laterality: Right;    Social History   Socioeconomic History  . Marital status: Married    Spouse name: Not on file  . Number of children: Not on file  . Years of education: Not on file  . Highest education level: Not on file  Occupational History  . Not on file  Tobacco Use  . Smoking status: Never Smoker  . Smokeless tobacco: Never Used  Vaping Use  . Vaping Use: Never used  Substance and Sexual Activity  . Alcohol use: Not Currently  . Drug use: No  . Sexual activity: Not on file  Other Topics Concern  . Not on file  Social History Narrative  . Not on file   Social Determinants of Health   Financial Resource Strain:   . Difficulty of Paying Living Expenses: Not on file  Food Insecurity:   . Worried  About Running Out of Food in the Last Year: Not on file  . Ran Out of Food in the Last Year: Not on file  Transportation Needs:   . Lack of Transportation (Medical): Not on file  . Lack of Transportation (Non-Medical): Not on file  Physical Activity:   . Days of Exercise per Week: Not on file  . Minutes of Exercise per Session: Not on file  Stress:   . Feeling of Stress : Not on file  Social Connections:   . Frequency of Communication with Friends and Family: Not on file  . Frequency of Social Gatherings with Friends and Family: Not on file  . Attends Religious Services: Not on file  . Active Member of Clubs or Organizations: Not on file  . Attends Archivist Meetings: Not on file   . Marital Status: Not on file  Intimate Partner Violence:   . Fear of Current or Ex-Partner: Not on file  . Emotionally Abused: Not on file  . Physically Abused: Not on file  . Sexually Abused: Not on file    Current Outpatient Medications on File Prior to Visit  Medication Sig Dispense Refill  . ACCU-CHEK GUIDE test strip 1 each by Other route 4 (four) times daily. E11.22  1  . acetaminophen (TYLENOL) 500 MG tablet Take 1,000 mg by mouth every 6 (six) hours as needed for moderate pain or headache.    . allopurinol (ZYLOPRIM) 100 MG tablet Take 100 mg by mouth daily.     . B Complex-C-Zn-Folic Acid (DIALYVITE/ZINC) TABS Take 1 tablet by mouth every evening.   3  . calcium acetate (PHOSLO) 667 MG capsule Take 1,334 mg by mouth 2 (two) times daily with a meal.   3  . Calcium Carbonate Antacid (TUMS E-X PO) Take 2 tablets by mouth daily as needed (acid reflux).     . carvedilol (COREG) 25 MG tablet Take 25 mg by mouth in the morning and at bedtime.     . famotidine (PEPCID) 20 MG tablet Take 20 mg by mouth at bedtime.    Marland Kitchen guaiFENesin (MUCINEX) 600 MG 12 hr tablet Take 600 mg by mouth daily as needed for to loosen phlegm.    Marland Kitchen guaifenesin (ROBITUSSIN) 100 MG/5ML syrup Take 100 mg by mouth 3 (three) times daily as needed for cough.    Marland Kitchen HYDROcodone-acetaminophen (NORCO/VICODIN) 5-325 MG tablet Take 1 tablet by mouth every 4 (four) hours as needed for moderate pain. 20 tablet 0  . losartan (COZAAR) 50 MG tablet Take 50 mg by mouth daily.     . Methoxy PEG-Epoetin Beta (MIRCERA IJ) Apply 1 application topically once a week.     Marland Kitchen RELION INSULIN SYR 0.5ML/31G 31G X 5/16" 0.5 ML MISC USE 1 ONCE DAILY    . rosuvastatin (CRESTOR) 10 MG tablet Take 10 mg by mouth daily.     Marland Kitchen triamcinolone cream (KENALOG) 0.1 % Apply 1 application topically daily as needed (rash).      No current facility-administered medications on file prior to visit.    Allergies  Allergen Reactions  . Penicillins Rash     Did it involve swelling of the face/tongue/throat, SOB, or low BP? No Did it involve sudden or severe rash/hives, skin peeling, or any reaction on the inside of your mouth or nose? Yes Did you need to seek medical attention at a hospital or doctor's office? N/A When did it last happen?N/A If all above answers are "NO", may proceed with cephalosporin  use.    Family History  Problem Relation Age of Onset  . Diabetes Mother     BP 138/74   Pulse 94   Ht 5' (1.524 m)   Wt 193 lb 9.6 oz (87.8 kg)   SpO2 96%   BMI 37.81 kg/m    Review of Systems Denies LOC    Objective:   Physical Exam VITAL SIGNS:  See vs page GENERAL: no distress Pulses: dorsalis pedis intact bilat.   MSK: no deformity of the feet CV: trace bilat leg edema Skin:  no ulcer on the feet.  normal color and temp on the feet.  Neuro: sensation is intact to touch on the feet, but decreased from normal.    A1c=8.1%    Assessment & Plan:  Insulin-requiring type 2 DM, with ESRD Hypoglycemia, due to insulin: this limits aggressiveness of glycemic control SDOH: This limits insulin options  Patient Instructions  check your blood sugar twice a day.  vary the time of day when you check, between before the 3 meals, and at bedtime.  also check if you have symptoms of your blood sugar being too high or too low.  please keep a record of the readings and bring it to your next appointment here (or you can bring the meter itself).  You can write it on any piece of paper.  please call us sooner if your blood sugar goes below 70, or if you have a lot of readings over 200. Please take 28 units with breakfast on dialysis days, and 36 units with breakfast on days off.   On this type of insulin schedule, you should eat meals on a regular schedule (especially lunch).  If a meal is missed or significantly delayed, your blood sugar could go low.   Please come back for a follow-up appointment in 3 months.

## 2020-11-29 NOTE — Patient Instructions (Addendum)
check your blood sugar twice a day.  vary the time of day when you check, between before the 3 meals, and at bedtime.  also check if you have symptoms of your blood sugar being too high or too low.  please keep a record of the readings and bring it to your next appointment here (or you can bring the meter itself).  You can write it on any piece of paper.  please call us sooner if your blood sugar goes below 70, or if you have a lot of readings over 200. Please take 28 units with breakfast on dialysis days, and 36 units with breakfast on days off.   On this type of insulin schedule, you should eat meals on a regular schedule (especially lunch).  If a meal is missed or significantly delayed, your blood sugar could go low.   Please come back for a follow-up appointment in 3 months.

## 2020-12-26 DIAGNOSIS — N2581 Secondary hyperparathyroidism of renal origin: Secondary | ICD-10-CM | POA: Diagnosis not present

## 2020-12-26 DIAGNOSIS — N186 End stage renal disease: Secondary | ICD-10-CM | POA: Diagnosis not present

## 2020-12-26 DIAGNOSIS — R519 Headache, unspecified: Secondary | ICD-10-CM | POA: Diagnosis not present

## 2020-12-26 DIAGNOSIS — Z992 Dependence on renal dialysis: Secondary | ICD-10-CM | POA: Diagnosis not present

## 2020-12-26 DIAGNOSIS — D509 Iron deficiency anemia, unspecified: Secondary | ICD-10-CM | POA: Diagnosis not present

## 2020-12-26 DIAGNOSIS — E1129 Type 2 diabetes mellitus with other diabetic kidney complication: Secondary | ICD-10-CM | POA: Diagnosis not present

## 2020-12-26 DIAGNOSIS — D688 Other specified coagulation defects: Secondary | ICD-10-CM | POA: Diagnosis not present

## 2020-12-28 DIAGNOSIS — N2581 Secondary hyperparathyroidism of renal origin: Secondary | ICD-10-CM | POA: Diagnosis not present

## 2020-12-28 DIAGNOSIS — D509 Iron deficiency anemia, unspecified: Secondary | ICD-10-CM | POA: Diagnosis not present

## 2020-12-28 DIAGNOSIS — E1129 Type 2 diabetes mellitus with other diabetic kidney complication: Secondary | ICD-10-CM | POA: Diagnosis not present

## 2020-12-28 DIAGNOSIS — Z992 Dependence on renal dialysis: Secondary | ICD-10-CM | POA: Diagnosis not present

## 2020-12-28 DIAGNOSIS — R519 Headache, unspecified: Secondary | ICD-10-CM | POA: Diagnosis not present

## 2020-12-28 DIAGNOSIS — N186 End stage renal disease: Secondary | ICD-10-CM | POA: Diagnosis not present

## 2020-12-28 DIAGNOSIS — D688 Other specified coagulation defects: Secondary | ICD-10-CM | POA: Diagnosis not present

## 2020-12-30 DIAGNOSIS — N186 End stage renal disease: Secondary | ICD-10-CM | POA: Diagnosis not present

## 2020-12-30 DIAGNOSIS — D509 Iron deficiency anemia, unspecified: Secondary | ICD-10-CM | POA: Diagnosis not present

## 2020-12-30 DIAGNOSIS — R519 Headache, unspecified: Secondary | ICD-10-CM | POA: Diagnosis not present

## 2020-12-30 DIAGNOSIS — D688 Other specified coagulation defects: Secondary | ICD-10-CM | POA: Diagnosis not present

## 2020-12-30 DIAGNOSIS — N2581 Secondary hyperparathyroidism of renal origin: Secondary | ICD-10-CM | POA: Diagnosis not present

## 2020-12-30 DIAGNOSIS — E1129 Type 2 diabetes mellitus with other diabetic kidney complication: Secondary | ICD-10-CM | POA: Diagnosis not present

## 2020-12-30 DIAGNOSIS — Z992 Dependence on renal dialysis: Secondary | ICD-10-CM | POA: Diagnosis not present

## 2021-01-01 DIAGNOSIS — E1129 Type 2 diabetes mellitus with other diabetic kidney complication: Secondary | ICD-10-CM | POA: Diagnosis not present

## 2021-01-01 DIAGNOSIS — N186 End stage renal disease: Secondary | ICD-10-CM | POA: Diagnosis not present

## 2021-01-01 DIAGNOSIS — Z992 Dependence on renal dialysis: Secondary | ICD-10-CM | POA: Diagnosis not present

## 2021-01-01 DIAGNOSIS — N2581 Secondary hyperparathyroidism of renal origin: Secondary | ICD-10-CM | POA: Diagnosis not present

## 2021-01-01 DIAGNOSIS — R519 Headache, unspecified: Secondary | ICD-10-CM | POA: Diagnosis not present

## 2021-01-01 DIAGNOSIS — D509 Iron deficiency anemia, unspecified: Secondary | ICD-10-CM | POA: Diagnosis not present

## 2021-01-01 DIAGNOSIS — D688 Other specified coagulation defects: Secondary | ICD-10-CM | POA: Diagnosis not present

## 2021-01-04 DIAGNOSIS — D509 Iron deficiency anemia, unspecified: Secondary | ICD-10-CM | POA: Diagnosis not present

## 2021-01-04 DIAGNOSIS — N2581 Secondary hyperparathyroidism of renal origin: Secondary | ICD-10-CM | POA: Diagnosis not present

## 2021-01-04 DIAGNOSIS — N186 End stage renal disease: Secondary | ICD-10-CM | POA: Diagnosis not present

## 2021-01-04 DIAGNOSIS — R519 Headache, unspecified: Secondary | ICD-10-CM | POA: Diagnosis not present

## 2021-01-04 DIAGNOSIS — D688 Other specified coagulation defects: Secondary | ICD-10-CM | POA: Diagnosis not present

## 2021-01-04 DIAGNOSIS — E1129 Type 2 diabetes mellitus with other diabetic kidney complication: Secondary | ICD-10-CM | POA: Diagnosis not present

## 2021-01-04 DIAGNOSIS — Z992 Dependence on renal dialysis: Secondary | ICD-10-CM | POA: Diagnosis not present

## 2021-01-06 DIAGNOSIS — N186 End stage renal disease: Secondary | ICD-10-CM | POA: Diagnosis not present

## 2021-01-06 DIAGNOSIS — E1129 Type 2 diabetes mellitus with other diabetic kidney complication: Secondary | ICD-10-CM | POA: Diagnosis not present

## 2021-01-06 DIAGNOSIS — D509 Iron deficiency anemia, unspecified: Secondary | ICD-10-CM | POA: Diagnosis not present

## 2021-01-06 DIAGNOSIS — Z992 Dependence on renal dialysis: Secondary | ICD-10-CM | POA: Diagnosis not present

## 2021-01-06 DIAGNOSIS — D688 Other specified coagulation defects: Secondary | ICD-10-CM | POA: Diagnosis not present

## 2021-01-06 DIAGNOSIS — N2581 Secondary hyperparathyroidism of renal origin: Secondary | ICD-10-CM | POA: Diagnosis not present

## 2021-01-06 DIAGNOSIS — R519 Headache, unspecified: Secondary | ICD-10-CM | POA: Diagnosis not present

## 2021-01-08 DIAGNOSIS — Z992 Dependence on renal dialysis: Secondary | ICD-10-CM | POA: Diagnosis not present

## 2021-01-08 DIAGNOSIS — N186 End stage renal disease: Secondary | ICD-10-CM | POA: Diagnosis not present

## 2021-01-08 DIAGNOSIS — N2581 Secondary hyperparathyroidism of renal origin: Secondary | ICD-10-CM | POA: Diagnosis not present

## 2021-01-08 DIAGNOSIS — D509 Iron deficiency anemia, unspecified: Secondary | ICD-10-CM | POA: Diagnosis not present

## 2021-01-08 DIAGNOSIS — D688 Other specified coagulation defects: Secondary | ICD-10-CM | POA: Diagnosis not present

## 2021-01-08 DIAGNOSIS — R519 Headache, unspecified: Secondary | ICD-10-CM | POA: Diagnosis not present

## 2021-01-08 DIAGNOSIS — E1129 Type 2 diabetes mellitus with other diabetic kidney complication: Secondary | ICD-10-CM | POA: Diagnosis not present

## 2021-01-11 DIAGNOSIS — I129 Hypertensive chronic kidney disease with stage 1 through stage 4 chronic kidney disease, or unspecified chronic kidney disease: Secondary | ICD-10-CM | POA: Diagnosis not present

## 2021-01-11 DIAGNOSIS — R519 Headache, unspecified: Secondary | ICD-10-CM | POA: Diagnosis not present

## 2021-01-11 DIAGNOSIS — E1129 Type 2 diabetes mellitus with other diabetic kidney complication: Secondary | ICD-10-CM | POA: Diagnosis not present

## 2021-01-11 DIAGNOSIS — N2581 Secondary hyperparathyroidism of renal origin: Secondary | ICD-10-CM | POA: Diagnosis not present

## 2021-01-11 DIAGNOSIS — D688 Other specified coagulation defects: Secondary | ICD-10-CM | POA: Diagnosis not present

## 2021-01-11 DIAGNOSIS — Z992 Dependence on renal dialysis: Secondary | ICD-10-CM | POA: Diagnosis not present

## 2021-01-11 DIAGNOSIS — D509 Iron deficiency anemia, unspecified: Secondary | ICD-10-CM | POA: Diagnosis not present

## 2021-01-11 DIAGNOSIS — N186 End stage renal disease: Secondary | ICD-10-CM | POA: Diagnosis not present

## 2021-01-11 DIAGNOSIS — E1122 Type 2 diabetes mellitus with diabetic chronic kidney disease: Secondary | ICD-10-CM | POA: Diagnosis not present

## 2021-01-11 DIAGNOSIS — D638 Anemia in other chronic diseases classified elsewhere: Secondary | ICD-10-CM | POA: Diagnosis not present

## 2021-01-11 DIAGNOSIS — N184 Chronic kidney disease, stage 4 (severe): Secondary | ICD-10-CM | POA: Diagnosis not present

## 2021-01-11 DIAGNOSIS — N185 Chronic kidney disease, stage 5: Secondary | ICD-10-CM | POA: Diagnosis not present

## 2021-01-11 DIAGNOSIS — I10 Essential (primary) hypertension: Secondary | ICD-10-CM | POA: Diagnosis not present

## 2021-01-11 DIAGNOSIS — K219 Gastro-esophageal reflux disease without esophagitis: Secondary | ICD-10-CM | POA: Diagnosis not present

## 2021-01-11 DIAGNOSIS — D631 Anemia in chronic kidney disease: Secondary | ICD-10-CM | POA: Diagnosis not present

## 2021-01-11 DIAGNOSIS — E785 Hyperlipidemia, unspecified: Secondary | ICD-10-CM | POA: Diagnosis not present

## 2021-01-13 DIAGNOSIS — E1129 Type 2 diabetes mellitus with other diabetic kidney complication: Secondary | ICD-10-CM | POA: Diagnosis not present

## 2021-01-13 DIAGNOSIS — D688 Other specified coagulation defects: Secondary | ICD-10-CM | POA: Diagnosis not present

## 2021-01-13 DIAGNOSIS — Z992 Dependence on renal dialysis: Secondary | ICD-10-CM | POA: Diagnosis not present

## 2021-01-13 DIAGNOSIS — R519 Headache, unspecified: Secondary | ICD-10-CM | POA: Diagnosis not present

## 2021-01-13 DIAGNOSIS — N186 End stage renal disease: Secondary | ICD-10-CM | POA: Diagnosis not present

## 2021-01-13 DIAGNOSIS — D509 Iron deficiency anemia, unspecified: Secondary | ICD-10-CM | POA: Diagnosis not present

## 2021-01-13 DIAGNOSIS — N2581 Secondary hyperparathyroidism of renal origin: Secondary | ICD-10-CM | POA: Diagnosis not present

## 2021-01-15 DIAGNOSIS — N186 End stage renal disease: Secondary | ICD-10-CM | POA: Diagnosis not present

## 2021-01-15 DIAGNOSIS — R519 Headache, unspecified: Secondary | ICD-10-CM | POA: Diagnosis not present

## 2021-01-15 DIAGNOSIS — E1129 Type 2 diabetes mellitus with other diabetic kidney complication: Secondary | ICD-10-CM | POA: Diagnosis not present

## 2021-01-15 DIAGNOSIS — D688 Other specified coagulation defects: Secondary | ICD-10-CM | POA: Diagnosis not present

## 2021-01-15 DIAGNOSIS — Z992 Dependence on renal dialysis: Secondary | ICD-10-CM | POA: Diagnosis not present

## 2021-01-15 DIAGNOSIS — D509 Iron deficiency anemia, unspecified: Secondary | ICD-10-CM | POA: Diagnosis not present

## 2021-01-15 DIAGNOSIS — N2581 Secondary hyperparathyroidism of renal origin: Secondary | ICD-10-CM | POA: Diagnosis not present

## 2021-01-18 DIAGNOSIS — E1129 Type 2 diabetes mellitus with other diabetic kidney complication: Secondary | ICD-10-CM | POA: Diagnosis not present

## 2021-01-18 DIAGNOSIS — D688 Other specified coagulation defects: Secondary | ICD-10-CM | POA: Diagnosis not present

## 2021-01-18 DIAGNOSIS — Z992 Dependence on renal dialysis: Secondary | ICD-10-CM | POA: Diagnosis not present

## 2021-01-18 DIAGNOSIS — N186 End stage renal disease: Secondary | ICD-10-CM | POA: Diagnosis not present

## 2021-01-18 DIAGNOSIS — N2581 Secondary hyperparathyroidism of renal origin: Secondary | ICD-10-CM | POA: Diagnosis not present

## 2021-01-18 DIAGNOSIS — R519 Headache, unspecified: Secondary | ICD-10-CM | POA: Diagnosis not present

## 2021-01-18 DIAGNOSIS — D509 Iron deficiency anemia, unspecified: Secondary | ICD-10-CM | POA: Diagnosis not present

## 2021-01-20 DIAGNOSIS — R519 Headache, unspecified: Secondary | ICD-10-CM | POA: Diagnosis not present

## 2021-01-20 DIAGNOSIS — N2581 Secondary hyperparathyroidism of renal origin: Secondary | ICD-10-CM | POA: Diagnosis not present

## 2021-01-20 DIAGNOSIS — D688 Other specified coagulation defects: Secondary | ICD-10-CM | POA: Diagnosis not present

## 2021-01-20 DIAGNOSIS — E1129 Type 2 diabetes mellitus with other diabetic kidney complication: Secondary | ICD-10-CM | POA: Diagnosis not present

## 2021-01-20 DIAGNOSIS — N186 End stage renal disease: Secondary | ICD-10-CM | POA: Diagnosis not present

## 2021-01-20 DIAGNOSIS — D509 Iron deficiency anemia, unspecified: Secondary | ICD-10-CM | POA: Diagnosis not present

## 2021-01-20 DIAGNOSIS — Z992 Dependence on renal dialysis: Secondary | ICD-10-CM | POA: Diagnosis not present

## 2021-01-22 DIAGNOSIS — R519 Headache, unspecified: Secondary | ICD-10-CM | POA: Diagnosis not present

## 2021-01-22 DIAGNOSIS — Z992 Dependence on renal dialysis: Secondary | ICD-10-CM | POA: Diagnosis not present

## 2021-01-22 DIAGNOSIS — D688 Other specified coagulation defects: Secondary | ICD-10-CM | POA: Diagnosis not present

## 2021-01-22 DIAGNOSIS — N2581 Secondary hyperparathyroidism of renal origin: Secondary | ICD-10-CM | POA: Diagnosis not present

## 2021-01-22 DIAGNOSIS — E1129 Type 2 diabetes mellitus with other diabetic kidney complication: Secondary | ICD-10-CM | POA: Diagnosis not present

## 2021-01-22 DIAGNOSIS — D509 Iron deficiency anemia, unspecified: Secondary | ICD-10-CM | POA: Diagnosis not present

## 2021-01-22 DIAGNOSIS — N186 End stage renal disease: Secondary | ICD-10-CM | POA: Diagnosis not present

## 2021-01-24 DIAGNOSIS — E1122 Type 2 diabetes mellitus with diabetic chronic kidney disease: Secondary | ICD-10-CM | POA: Diagnosis not present

## 2021-01-24 DIAGNOSIS — Z992 Dependence on renal dialysis: Secondary | ICD-10-CM | POA: Diagnosis not present

## 2021-01-24 DIAGNOSIS — N186 End stage renal disease: Secondary | ICD-10-CM | POA: Diagnosis not present

## 2021-01-25 DIAGNOSIS — D509 Iron deficiency anemia, unspecified: Secondary | ICD-10-CM | POA: Diagnosis not present

## 2021-01-25 DIAGNOSIS — D631 Anemia in chronic kidney disease: Secondary | ICD-10-CM | POA: Diagnosis not present

## 2021-01-25 DIAGNOSIS — N186 End stage renal disease: Secondary | ICD-10-CM | POA: Diagnosis not present

## 2021-01-25 DIAGNOSIS — E1129 Type 2 diabetes mellitus with other diabetic kidney complication: Secondary | ICD-10-CM | POA: Diagnosis not present

## 2021-01-25 DIAGNOSIS — N2581 Secondary hyperparathyroidism of renal origin: Secondary | ICD-10-CM | POA: Diagnosis not present

## 2021-01-25 DIAGNOSIS — D688 Other specified coagulation defects: Secondary | ICD-10-CM | POA: Diagnosis not present

## 2021-01-25 DIAGNOSIS — Z992 Dependence on renal dialysis: Secondary | ICD-10-CM | POA: Diagnosis not present

## 2021-01-25 DIAGNOSIS — R519 Headache, unspecified: Secondary | ICD-10-CM | POA: Diagnosis not present

## 2021-01-27 DIAGNOSIS — D688 Other specified coagulation defects: Secondary | ICD-10-CM | POA: Diagnosis not present

## 2021-01-27 DIAGNOSIS — D631 Anemia in chronic kidney disease: Secondary | ICD-10-CM | POA: Diagnosis not present

## 2021-01-27 DIAGNOSIS — D509 Iron deficiency anemia, unspecified: Secondary | ICD-10-CM | POA: Diagnosis not present

## 2021-01-27 DIAGNOSIS — N2581 Secondary hyperparathyroidism of renal origin: Secondary | ICD-10-CM | POA: Diagnosis not present

## 2021-01-27 DIAGNOSIS — N186 End stage renal disease: Secondary | ICD-10-CM | POA: Diagnosis not present

## 2021-01-27 DIAGNOSIS — E1129 Type 2 diabetes mellitus with other diabetic kidney complication: Secondary | ICD-10-CM | POA: Diagnosis not present

## 2021-01-27 DIAGNOSIS — Z992 Dependence on renal dialysis: Secondary | ICD-10-CM | POA: Diagnosis not present

## 2021-01-27 DIAGNOSIS — R519 Headache, unspecified: Secondary | ICD-10-CM | POA: Diagnosis not present

## 2021-01-29 DIAGNOSIS — Z992 Dependence on renal dialysis: Secondary | ICD-10-CM | POA: Diagnosis not present

## 2021-01-29 DIAGNOSIS — D509 Iron deficiency anemia, unspecified: Secondary | ICD-10-CM | POA: Diagnosis not present

## 2021-01-29 DIAGNOSIS — D631 Anemia in chronic kidney disease: Secondary | ICD-10-CM | POA: Diagnosis not present

## 2021-01-29 DIAGNOSIS — N186 End stage renal disease: Secondary | ICD-10-CM | POA: Diagnosis not present

## 2021-01-29 DIAGNOSIS — N2581 Secondary hyperparathyroidism of renal origin: Secondary | ICD-10-CM | POA: Diagnosis not present

## 2021-01-29 DIAGNOSIS — E1129 Type 2 diabetes mellitus with other diabetic kidney complication: Secondary | ICD-10-CM | POA: Diagnosis not present

## 2021-01-29 DIAGNOSIS — R519 Headache, unspecified: Secondary | ICD-10-CM | POA: Diagnosis not present

## 2021-01-29 DIAGNOSIS — D688 Other specified coagulation defects: Secondary | ICD-10-CM | POA: Diagnosis not present

## 2021-02-01 DIAGNOSIS — D509 Iron deficiency anemia, unspecified: Secondary | ICD-10-CM | POA: Diagnosis not present

## 2021-02-01 DIAGNOSIS — R519 Headache, unspecified: Secondary | ICD-10-CM | POA: Diagnosis not present

## 2021-02-01 DIAGNOSIS — E1129 Type 2 diabetes mellitus with other diabetic kidney complication: Secondary | ICD-10-CM | POA: Diagnosis not present

## 2021-02-01 DIAGNOSIS — D688 Other specified coagulation defects: Secondary | ICD-10-CM | POA: Diagnosis not present

## 2021-02-01 DIAGNOSIS — N2581 Secondary hyperparathyroidism of renal origin: Secondary | ICD-10-CM | POA: Diagnosis not present

## 2021-02-01 DIAGNOSIS — D631 Anemia in chronic kidney disease: Secondary | ICD-10-CM | POA: Diagnosis not present

## 2021-02-01 DIAGNOSIS — Z992 Dependence on renal dialysis: Secondary | ICD-10-CM | POA: Diagnosis not present

## 2021-02-01 DIAGNOSIS — N186 End stage renal disease: Secondary | ICD-10-CM | POA: Diagnosis not present

## 2021-02-03 DIAGNOSIS — Z992 Dependence on renal dialysis: Secondary | ICD-10-CM | POA: Diagnosis not present

## 2021-02-03 DIAGNOSIS — D688 Other specified coagulation defects: Secondary | ICD-10-CM | POA: Diagnosis not present

## 2021-02-03 DIAGNOSIS — N186 End stage renal disease: Secondary | ICD-10-CM | POA: Diagnosis not present

## 2021-02-03 DIAGNOSIS — N2581 Secondary hyperparathyroidism of renal origin: Secondary | ICD-10-CM | POA: Diagnosis not present

## 2021-02-03 DIAGNOSIS — E1129 Type 2 diabetes mellitus with other diabetic kidney complication: Secondary | ICD-10-CM | POA: Diagnosis not present

## 2021-02-03 DIAGNOSIS — R519 Headache, unspecified: Secondary | ICD-10-CM | POA: Diagnosis not present

## 2021-02-03 DIAGNOSIS — D631 Anemia in chronic kidney disease: Secondary | ICD-10-CM | POA: Diagnosis not present

## 2021-02-03 DIAGNOSIS — D509 Iron deficiency anemia, unspecified: Secondary | ICD-10-CM | POA: Diagnosis not present

## 2021-02-04 DIAGNOSIS — H25812 Combined forms of age-related cataract, left eye: Secondary | ICD-10-CM | POA: Diagnosis not present

## 2021-02-04 DIAGNOSIS — H409 Unspecified glaucoma: Secondary | ICD-10-CM | POA: Diagnosis not present

## 2021-02-04 DIAGNOSIS — H401121 Primary open-angle glaucoma, left eye, mild stage: Secondary | ICD-10-CM | POA: Diagnosis not present

## 2021-02-05 DIAGNOSIS — D509 Iron deficiency anemia, unspecified: Secondary | ICD-10-CM | POA: Diagnosis not present

## 2021-02-05 DIAGNOSIS — N186 End stage renal disease: Secondary | ICD-10-CM | POA: Diagnosis not present

## 2021-02-05 DIAGNOSIS — D631 Anemia in chronic kidney disease: Secondary | ICD-10-CM | POA: Diagnosis not present

## 2021-02-05 DIAGNOSIS — N2581 Secondary hyperparathyroidism of renal origin: Secondary | ICD-10-CM | POA: Diagnosis not present

## 2021-02-05 DIAGNOSIS — Z992 Dependence on renal dialysis: Secondary | ICD-10-CM | POA: Diagnosis not present

## 2021-02-05 DIAGNOSIS — D688 Other specified coagulation defects: Secondary | ICD-10-CM | POA: Diagnosis not present

## 2021-02-05 DIAGNOSIS — R519 Headache, unspecified: Secondary | ICD-10-CM | POA: Diagnosis not present

## 2021-02-05 DIAGNOSIS — E1129 Type 2 diabetes mellitus with other diabetic kidney complication: Secondary | ICD-10-CM | POA: Diagnosis not present

## 2021-02-07 ENCOUNTER — Ambulatory Visit: Payer: Medicare Other | Admitting: Podiatry

## 2021-02-08 DIAGNOSIS — Z992 Dependence on renal dialysis: Secondary | ICD-10-CM | POA: Diagnosis not present

## 2021-02-08 DIAGNOSIS — D631 Anemia in chronic kidney disease: Secondary | ICD-10-CM | POA: Diagnosis not present

## 2021-02-08 DIAGNOSIS — N2581 Secondary hyperparathyroidism of renal origin: Secondary | ICD-10-CM | POA: Diagnosis not present

## 2021-02-08 DIAGNOSIS — D509 Iron deficiency anemia, unspecified: Secondary | ICD-10-CM | POA: Diagnosis not present

## 2021-02-08 DIAGNOSIS — D688 Other specified coagulation defects: Secondary | ICD-10-CM | POA: Diagnosis not present

## 2021-02-08 DIAGNOSIS — N186 End stage renal disease: Secondary | ICD-10-CM | POA: Diagnosis not present

## 2021-02-08 DIAGNOSIS — R519 Headache, unspecified: Secondary | ICD-10-CM | POA: Diagnosis not present

## 2021-02-08 DIAGNOSIS — E1129 Type 2 diabetes mellitus with other diabetic kidney complication: Secondary | ICD-10-CM | POA: Diagnosis not present

## 2021-02-10 DIAGNOSIS — D631 Anemia in chronic kidney disease: Secondary | ICD-10-CM | POA: Diagnosis not present

## 2021-02-10 DIAGNOSIS — R519 Headache, unspecified: Secondary | ICD-10-CM | POA: Diagnosis not present

## 2021-02-10 DIAGNOSIS — Z992 Dependence on renal dialysis: Secondary | ICD-10-CM | POA: Diagnosis not present

## 2021-02-10 DIAGNOSIS — E1129 Type 2 diabetes mellitus with other diabetic kidney complication: Secondary | ICD-10-CM | POA: Diagnosis not present

## 2021-02-10 DIAGNOSIS — D688 Other specified coagulation defects: Secondary | ICD-10-CM | POA: Diagnosis not present

## 2021-02-10 DIAGNOSIS — N2581 Secondary hyperparathyroidism of renal origin: Secondary | ICD-10-CM | POA: Diagnosis not present

## 2021-02-10 DIAGNOSIS — N186 End stage renal disease: Secondary | ICD-10-CM | POA: Diagnosis not present

## 2021-02-10 DIAGNOSIS — D509 Iron deficiency anemia, unspecified: Secondary | ICD-10-CM | POA: Diagnosis not present

## 2021-02-12 DIAGNOSIS — D631 Anemia in chronic kidney disease: Secondary | ICD-10-CM | POA: Diagnosis not present

## 2021-02-12 DIAGNOSIS — D688 Other specified coagulation defects: Secondary | ICD-10-CM | POA: Diagnosis not present

## 2021-02-12 DIAGNOSIS — R519 Headache, unspecified: Secondary | ICD-10-CM | POA: Diagnosis not present

## 2021-02-12 DIAGNOSIS — D509 Iron deficiency anemia, unspecified: Secondary | ICD-10-CM | POA: Diagnosis not present

## 2021-02-12 DIAGNOSIS — Z992 Dependence on renal dialysis: Secondary | ICD-10-CM | POA: Diagnosis not present

## 2021-02-12 DIAGNOSIS — N186 End stage renal disease: Secondary | ICD-10-CM | POA: Diagnosis not present

## 2021-02-12 DIAGNOSIS — N2581 Secondary hyperparathyroidism of renal origin: Secondary | ICD-10-CM | POA: Diagnosis not present

## 2021-02-12 DIAGNOSIS — E1129 Type 2 diabetes mellitus with other diabetic kidney complication: Secondary | ICD-10-CM | POA: Diagnosis not present

## 2021-02-15 DIAGNOSIS — N184 Chronic kidney disease, stage 4 (severe): Secondary | ICD-10-CM | POA: Diagnosis not present

## 2021-02-15 DIAGNOSIS — N2581 Secondary hyperparathyroidism of renal origin: Secondary | ICD-10-CM | POA: Diagnosis not present

## 2021-02-15 DIAGNOSIS — D688 Other specified coagulation defects: Secondary | ICD-10-CM | POA: Diagnosis not present

## 2021-02-15 DIAGNOSIS — E785 Hyperlipidemia, unspecified: Secondary | ICD-10-CM | POA: Diagnosis not present

## 2021-02-15 DIAGNOSIS — K219 Gastro-esophageal reflux disease without esophagitis: Secondary | ICD-10-CM | POA: Diagnosis not present

## 2021-02-15 DIAGNOSIS — E1129 Type 2 diabetes mellitus with other diabetic kidney complication: Secondary | ICD-10-CM | POA: Diagnosis not present

## 2021-02-15 DIAGNOSIS — I10 Essential (primary) hypertension: Secondary | ICD-10-CM | POA: Diagnosis not present

## 2021-02-15 DIAGNOSIS — E1122 Type 2 diabetes mellitus with diabetic chronic kidney disease: Secondary | ICD-10-CM | POA: Diagnosis not present

## 2021-02-15 DIAGNOSIS — D631 Anemia in chronic kidney disease: Secondary | ICD-10-CM | POA: Diagnosis not present

## 2021-02-15 DIAGNOSIS — R519 Headache, unspecified: Secondary | ICD-10-CM | POA: Diagnosis not present

## 2021-02-15 DIAGNOSIS — N186 End stage renal disease: Secondary | ICD-10-CM | POA: Diagnosis not present

## 2021-02-15 DIAGNOSIS — N185 Chronic kidney disease, stage 5: Secondary | ICD-10-CM | POA: Diagnosis not present

## 2021-02-15 DIAGNOSIS — I129 Hypertensive chronic kidney disease with stage 1 through stage 4 chronic kidney disease, or unspecified chronic kidney disease: Secondary | ICD-10-CM | POA: Diagnosis not present

## 2021-02-15 DIAGNOSIS — Z992 Dependence on renal dialysis: Secondary | ICD-10-CM | POA: Diagnosis not present

## 2021-02-15 DIAGNOSIS — D509 Iron deficiency anemia, unspecified: Secondary | ICD-10-CM | POA: Diagnosis not present

## 2021-02-16 ENCOUNTER — Ambulatory Visit (INDEPENDENT_AMBULATORY_CARE_PROVIDER_SITE_OTHER): Payer: Medicare Other | Admitting: Podiatry

## 2021-02-16 ENCOUNTER — Other Ambulatory Visit: Payer: Self-pay

## 2021-02-16 ENCOUNTER — Encounter: Payer: Self-pay | Admitting: Podiatry

## 2021-02-16 DIAGNOSIS — M79675 Pain in left toe(s): Secondary | ICD-10-CM

## 2021-02-16 DIAGNOSIS — N186 End stage renal disease: Secondary | ICD-10-CM | POA: Diagnosis not present

## 2021-02-16 DIAGNOSIS — K219 Gastro-esophageal reflux disease without esophagitis: Secondary | ICD-10-CM | POA: Insufficient documentation

## 2021-02-16 DIAGNOSIS — M79674 Pain in right toe(s): Secondary | ICD-10-CM | POA: Diagnosis not present

## 2021-02-16 DIAGNOSIS — H6520 Chronic serous otitis media, unspecified ear: Secondary | ICD-10-CM | POA: Insufficient documentation

## 2021-02-16 DIAGNOSIS — H25811 Combined forms of age-related cataract, right eye: Secondary | ICD-10-CM | POA: Diagnosis not present

## 2021-02-16 DIAGNOSIS — L84 Corns and callosities: Secondary | ICD-10-CM

## 2021-02-16 DIAGNOSIS — H43812 Vitreous degeneration, left eye: Secondary | ICD-10-CM | POA: Diagnosis not present

## 2021-02-16 DIAGNOSIS — Z794 Long term (current) use of insulin: Secondary | ICD-10-CM

## 2021-02-16 DIAGNOSIS — M2141 Flat foot [pes planus] (acquired), right foot: Secondary | ICD-10-CM

## 2021-02-16 DIAGNOSIS — E113293 Type 2 diabetes mellitus with mild nonproliferative diabetic retinopathy without macular edema, bilateral: Secondary | ICD-10-CM | POA: Diagnosis not present

## 2021-02-16 DIAGNOSIS — E0822 Diabetes mellitus due to underlying condition with diabetic chronic kidney disease: Secondary | ICD-10-CM | POA: Diagnosis not present

## 2021-02-16 DIAGNOSIS — Z992 Dependence on renal dialysis: Secondary | ICD-10-CM

## 2021-02-16 DIAGNOSIS — L80 Vitiligo: Secondary | ICD-10-CM | POA: Insufficient documentation

## 2021-02-16 DIAGNOSIS — B351 Tinea unguium: Secondary | ICD-10-CM | POA: Diagnosis not present

## 2021-02-16 DIAGNOSIS — J45901 Unspecified asthma with (acute) exacerbation: Secondary | ICD-10-CM | POA: Insufficient documentation

## 2021-02-16 DIAGNOSIS — F4321 Adjustment disorder with depressed mood: Secondary | ICD-10-CM | POA: Insufficient documentation

## 2021-02-16 DIAGNOSIS — M2142 Flat foot [pes planus] (acquired), left foot: Secondary | ICD-10-CM

## 2021-02-16 DIAGNOSIS — F321 Major depressive disorder, single episode, moderate: Secondary | ICD-10-CM | POA: Insufficient documentation

## 2021-02-16 DIAGNOSIS — J45998 Other asthma: Secondary | ICD-10-CM | POA: Insufficient documentation

## 2021-02-16 DIAGNOSIS — E785 Hyperlipidemia, unspecified: Secondary | ICD-10-CM | POA: Insufficient documentation

## 2021-02-17 DIAGNOSIS — D631 Anemia in chronic kidney disease: Secondary | ICD-10-CM | POA: Diagnosis not present

## 2021-02-17 DIAGNOSIS — N2581 Secondary hyperparathyroidism of renal origin: Secondary | ICD-10-CM | POA: Diagnosis not present

## 2021-02-17 DIAGNOSIS — Z992 Dependence on renal dialysis: Secondary | ICD-10-CM | POA: Diagnosis not present

## 2021-02-17 DIAGNOSIS — R519 Headache, unspecified: Secondary | ICD-10-CM | POA: Diagnosis not present

## 2021-02-17 DIAGNOSIS — E1129 Type 2 diabetes mellitus with other diabetic kidney complication: Secondary | ICD-10-CM | POA: Diagnosis not present

## 2021-02-17 DIAGNOSIS — N186 End stage renal disease: Secondary | ICD-10-CM | POA: Diagnosis not present

## 2021-02-17 DIAGNOSIS — D509 Iron deficiency anemia, unspecified: Secondary | ICD-10-CM | POA: Diagnosis not present

## 2021-02-17 DIAGNOSIS — D688 Other specified coagulation defects: Secondary | ICD-10-CM | POA: Diagnosis not present

## 2021-02-19 DIAGNOSIS — R519 Headache, unspecified: Secondary | ICD-10-CM | POA: Diagnosis not present

## 2021-02-19 DIAGNOSIS — D631 Anemia in chronic kidney disease: Secondary | ICD-10-CM | POA: Diagnosis not present

## 2021-02-19 DIAGNOSIS — D509 Iron deficiency anemia, unspecified: Secondary | ICD-10-CM | POA: Diagnosis not present

## 2021-02-19 DIAGNOSIS — E1129 Type 2 diabetes mellitus with other diabetic kidney complication: Secondary | ICD-10-CM | POA: Diagnosis not present

## 2021-02-19 DIAGNOSIS — N2581 Secondary hyperparathyroidism of renal origin: Secondary | ICD-10-CM | POA: Diagnosis not present

## 2021-02-19 DIAGNOSIS — N186 End stage renal disease: Secondary | ICD-10-CM | POA: Diagnosis not present

## 2021-02-19 DIAGNOSIS — Z992 Dependence on renal dialysis: Secondary | ICD-10-CM | POA: Diagnosis not present

## 2021-02-19 DIAGNOSIS — D688 Other specified coagulation defects: Secondary | ICD-10-CM | POA: Diagnosis not present

## 2021-02-20 NOTE — Progress Notes (Signed)
Subjective: Katie Garcia presents today at risk foot care. Pt has h/o NIDDM with chronic kidney disease and callus(es) b/l feet and painful mycotic toenails b/l that are difficult to trim. Pain interferes with ambulation. Aggravating factors include wearing enclosed shoe gear. Pain is relieved with periodic professional debridement.  She states her blood glucose was 300 mg/dl after breakfast this morning.  She voices no new pedal problems on today's visit. She continues dialysis treatment on TTS.  [name], MD is patient's PCP. Last visit was 02/14/2021.  Allergies  Allergen Reactions  . Penicillin G     Other reaction(s): rash/hives  . Penicillins Rash    Did it involve swelling of the face/tongue/throat, SOB, or low BP? No Did it involve sudden or severe rash/hives, skin peeling, or any reaction on the inside of your mouth or nose? Yes Did you need to seek medical attention at a hospital or doctor's office? N/A When did it last happen?N/A If all above answers are "NO", may proceed with cephalosporin use.    Objective: Katie Garcia is a pleasant 80 y.o.  AAF in NAD. AAO x 3.  There were no vitals filed for this visit.  Vascular Examination: Neurovascular status unchanged b/l. Capillary fill time to digits <3 seconds b/l. Palpable DP pulses b/l. Faintly palpable PT pulses b/l. Pedal hair absent b/l Skin temperature gradient within normal limits b/l. Nonpitting edema noted b/l LE.  Dermatological Examination: Pedal skin with normal turgor, texture and tone bilaterally. No open wounds bilaterally. No interdigital macerations bilaterally. Toenails 1-5 b/l elongated, dystrophic, thickened, crumbly with subungual debris and tenderness to dorsal palpation. Hyperkeratotic lesion(s) submet head 5 left foot and submet head 5 right foot.  No erythema, no edema, no drainage, no flocculence.  Musculoskeletal: Normal muscle strength 5/5 to all lower extremity muscle groups  bilaterally. No pain crepitus or joint limitation noted with ROM b/l. Pes planus deformity noted b/l.   Neurological Examination: Protective sensation intact 5/5 intact bilaterally with 10g monofilament b/l. Vibratory sensation intact b/l.  Assessment: 1. Pain due to onychomycosis of toenails of both feet   2. Callus   3. Pes planus of both feet   4. Diabetes mellitus due to underlying condition with chronic kidney disease on chronic dialysis, with long-term current use of insulin ([place])    Plan: -Examined patient. -No new findings. No new orders. -Continue diabetic foot care principles. Literature dispensed on today.  -Toenails 1-5 b/l were debrided in length and girth with sterile nail nippers and dremel without iatrogenic bleeding.  -Callus(es) submet head 5 left foot and submet head 5 right foot pared utilizing sterile scalpel blade without complication or incident. Total number debrided =2. -Patient to continue soft, supportive shoe gear daily. -Patient to report any pedal injuries to medical professional immediately. -Patient/POA to call should there be question/concern in the interim.  Return in about 3 months (around 05/16/2021).  Marzetta Board, DPM

## 2021-02-21 DIAGNOSIS — N186 End stage renal disease: Secondary | ICD-10-CM | POA: Diagnosis not present

## 2021-02-21 DIAGNOSIS — E1122 Type 2 diabetes mellitus with diabetic chronic kidney disease: Secondary | ICD-10-CM | POA: Diagnosis not present

## 2021-02-21 DIAGNOSIS — Z992 Dependence on renal dialysis: Secondary | ICD-10-CM | POA: Diagnosis not present

## 2021-02-22 DIAGNOSIS — R519 Headache, unspecified: Secondary | ICD-10-CM | POA: Diagnosis not present

## 2021-02-22 DIAGNOSIS — N186 End stage renal disease: Secondary | ICD-10-CM | POA: Diagnosis not present

## 2021-02-22 DIAGNOSIS — N2581 Secondary hyperparathyroidism of renal origin: Secondary | ICD-10-CM | POA: Diagnosis not present

## 2021-02-22 DIAGNOSIS — D688 Other specified coagulation defects: Secondary | ICD-10-CM | POA: Diagnosis not present

## 2021-02-22 DIAGNOSIS — Z992 Dependence on renal dialysis: Secondary | ICD-10-CM | POA: Diagnosis not present

## 2021-02-22 DIAGNOSIS — D631 Anemia in chronic kidney disease: Secondary | ICD-10-CM | POA: Diagnosis not present

## 2021-02-24 DIAGNOSIS — Z992 Dependence on renal dialysis: Secondary | ICD-10-CM | POA: Diagnosis not present

## 2021-02-24 DIAGNOSIS — I871 Compression of vein: Secondary | ICD-10-CM | POA: Diagnosis not present

## 2021-02-24 DIAGNOSIS — T82868A Thrombosis of vascular prosthetic devices, implants and grafts, initial encounter: Secondary | ICD-10-CM | POA: Diagnosis not present

## 2021-02-24 DIAGNOSIS — N186 End stage renal disease: Secondary | ICD-10-CM | POA: Diagnosis not present

## 2021-02-25 DIAGNOSIS — Z992 Dependence on renal dialysis: Secondary | ICD-10-CM | POA: Diagnosis not present

## 2021-02-25 DIAGNOSIS — R519 Headache, unspecified: Secondary | ICD-10-CM | POA: Diagnosis not present

## 2021-02-25 DIAGNOSIS — N2581 Secondary hyperparathyroidism of renal origin: Secondary | ICD-10-CM | POA: Diagnosis not present

## 2021-02-25 DIAGNOSIS — D631 Anemia in chronic kidney disease: Secondary | ICD-10-CM | POA: Diagnosis not present

## 2021-02-25 DIAGNOSIS — N186 End stage renal disease: Secondary | ICD-10-CM | POA: Diagnosis not present

## 2021-02-25 DIAGNOSIS — D688 Other specified coagulation defects: Secondary | ICD-10-CM | POA: Diagnosis not present

## 2021-02-26 DIAGNOSIS — N186 End stage renal disease: Secondary | ICD-10-CM | POA: Diagnosis not present

## 2021-02-26 DIAGNOSIS — D631 Anemia in chronic kidney disease: Secondary | ICD-10-CM | POA: Diagnosis not present

## 2021-02-26 DIAGNOSIS — D688 Other specified coagulation defects: Secondary | ICD-10-CM | POA: Diagnosis not present

## 2021-02-26 DIAGNOSIS — Z992 Dependence on renal dialysis: Secondary | ICD-10-CM | POA: Diagnosis not present

## 2021-02-26 DIAGNOSIS — R519 Headache, unspecified: Secondary | ICD-10-CM | POA: Diagnosis not present

## 2021-02-26 DIAGNOSIS — N2581 Secondary hyperparathyroidism of renal origin: Secondary | ICD-10-CM | POA: Diagnosis not present

## 2021-02-28 ENCOUNTER — Other Ambulatory Visit: Payer: Self-pay

## 2021-03-01 DIAGNOSIS — N186 End stage renal disease: Secondary | ICD-10-CM | POA: Diagnosis not present

## 2021-03-01 DIAGNOSIS — Z992 Dependence on renal dialysis: Secondary | ICD-10-CM | POA: Diagnosis not present

## 2021-03-01 DIAGNOSIS — D688 Other specified coagulation defects: Secondary | ICD-10-CM | POA: Diagnosis not present

## 2021-03-01 DIAGNOSIS — N2581 Secondary hyperparathyroidism of renal origin: Secondary | ICD-10-CM | POA: Diagnosis not present

## 2021-03-01 DIAGNOSIS — R519 Headache, unspecified: Secondary | ICD-10-CM | POA: Diagnosis not present

## 2021-03-02 ENCOUNTER — Other Ambulatory Visit: Payer: Self-pay

## 2021-03-02 ENCOUNTER — Ambulatory Visit (INDEPENDENT_AMBULATORY_CARE_PROVIDER_SITE_OTHER): Payer: Medicare Other | Admitting: Endocrinology

## 2021-03-02 VITALS — BP 150/62 | HR 87 | Ht 60.0 in | Wt 195.0 lb

## 2021-03-02 DIAGNOSIS — IMO0002 Reserved for concepts with insufficient information to code with codable children: Secondary | ICD-10-CM

## 2021-03-02 DIAGNOSIS — N186 End stage renal disease: Secondary | ICD-10-CM | POA: Diagnosis not present

## 2021-03-02 DIAGNOSIS — E1165 Type 2 diabetes mellitus with hyperglycemia: Secondary | ICD-10-CM

## 2021-03-02 DIAGNOSIS — E1122 Type 2 diabetes mellitus with diabetic chronic kidney disease: Secondary | ICD-10-CM | POA: Diagnosis not present

## 2021-03-02 DIAGNOSIS — E1365 Other specified diabetes mellitus with hyperglycemia: Secondary | ICD-10-CM

## 2021-03-02 MED ORDER — INSULIN NPH ISOPHANE & REGULAR (70-30) 100 UNIT/ML ~~LOC~~ SUSP: SUBCUTANEOUS | 11 refills | Status: DC

## 2021-03-02 NOTE — Patient Instructions (Addendum)
check your blood sugar twice a day.  vary the time of day when you check, between before the 3 meals, and at bedtime.  also check if you have symptoms of your blood sugar being too high or too low.  please keep a record of the readings and bring it to your next appointment here (or you can bring the meter itself).  You can write it on any piece of paper.  please call us sooner if your blood sugar goes below 70, or if you have a lot of readings over 200. Please take 26 units with breakfast on dialysis days, and 34 units with breakfast on days off.   On this type of insulin schedule, you should eat meals on a regular schedule (especially lunch).  If a meal is missed or significantly delayed, your blood sugar could go low.   Please come back for a follow-up appointment in 2 months.

## 2021-03-02 NOTE — Progress Notes (Signed)
Subjective:    Patient ID: Katie Garcia, female    DOB: Jul 12, 1941, 80 y.o.   MRN: [id number]  HPI Pt returns for f/u of diabetes mellitus:  DM type: Insulin-requiring type 2 (but vitiligo suggests type 1).   Dx'ed: 4098 Complications: PN, DR, and ESRD (on HD).  Therapy: insulin since 2004.   GDM: 1970 DKA: never.   Severe hypoglycemia: last episode was 2017.    Pancreatitis: never.   Pancreatic imaging: never.  SDOH: she takes human insulin, due to cost.  On HD days (morning), she takes insulin with breakfast when she gets home.  Other: she takes QD insulin, after poor results with multiple daily injections; she was changed to 70/30 qam, due to pattern of cbg's; fructosamine has confirmed A1c.   Interval history: no cbg record, but states cbg varies from 60-450.  It is in general lower on HD days than days off.  It is in general higher as the day goes on.  pt states she feels well in general.  Pt says she never misses the insulin.  No recent steroids.  She takes 28 units with breakfast on all days.  She sometimes has mild hypoglycemia after she gets home from HD, when she forgets to eat lunch.   Past Medical History:  Diagnosis Date  . Anemia   . Arthritis   . Chronic kidney disease    Dialysis T/Th/Sa at [facility]  . Diabetes mellitus without complication ([place])   . GERD (gastroesophageal reflux disease)   . Gout   . Headache   . Hypertension   . Type 2 diabetes mellitus ([place])     Past Surgical History:  Procedure Laterality Date  . ABDOMINAL HYSTERECTOMY    . AV FISTULA PLACEMENT Left 10/02/2016   Procedure: ARTERIOVENOUS (AV) FISTULA CREATION LEFT UPPER ARM;  Surgeon: Waynetta Sandy, MD;  Location: [facility];  Service: Vascular;  Laterality: Left;  . AV FISTULA PLACEMENT Right 07/14/2020   Procedure: RIGHT ARM ARTERIOVENOUS FISTULA CREATION;  Surgeon: Waynetta Sandy, MD;  Location: [place];  Service: Vascular;  Laterality: Right;  . BASCILIC VEIN  TRANSPOSITION Left 05/17/2020   Procedure: Insertion of Left arm arteriovenous gortex graftarm ;  Surgeon: Angelia Mould, MD;  Location: [facility] OR;  Service: Vascular;  Laterality: Left;  . CESAREAN SECTION    . DIALYSIS/PERMA CATHETER INSERTION    . REVISON OF ARTERIOVENOUS FISTULA Right 09/15/2020   Procedure: REVISON OF RIGHT UPPER ARM  ARTERIOVENOUS FISTULA;  Surgeon: Waynetta Sandy, MD;  Location: [place];  Service: Vascular;  Laterality: Right;    Social History   Socioeconomic History  . Marital status: Married    Spouse name: Not on file  . Number of children: Not on file  . Years of education: Not on file  . Highest education level: Not on file  Occupational History  . Not on file  Tobacco Use  . Smoking status: Never Smoker  . Smokeless tobacco: Never Used  Vaping Use  . Vaping Use: Never used  Substance and Sexual Activity  . Alcohol use: Not Currently  . Drug use: No  . Sexual activity: Not on file  Other Topics Concern  . Not on file  Social History Narrative  . Not on file   Social Determinants of Health   Financial Resource Strain: Not on file  Food Insecurity: Not on file  Transportation Needs: Not on file  Physical Activity: Not on file  Stress: Not on file  Social Connections: Not on file  Intimate Partner Violence: Not on file    Current Outpatient Medications on File Prior to Visit  Medication Sig Dispense Refill  . Accu-Chek FastClix Lancets MISC     . ACCU-CHEK GUIDE test strip 1 each by Other route 4 (four) times daily. E11.22  1  . acetaminophen (OFIRMEV) 10 MG/ML SOLN Take by mouth.    Marland Kitchen acetaminophen (TYLENOL) 500 MG tablet Take 1,000 mg by mouth every 6 (six) hours as needed for moderate pain or headache.    . allopurinol (ZYLOPRIM) 100 MG tablet Take 100 mg by mouth daily.     . B Complex-C-Zn-Folic Acid (DIALYVITE/ZINC) TABS Take 1 tablet by mouth every evening.   3  . Blood Glucose Monitoring Suppl (ACCU-CHEK GUIDE) w/Device  KIT     . calcium acetate (PHOSLO) 667 MG capsule Take 1,334 mg by mouth 2 (two) times daily with a meal.   3  . Calcium Carbonate Antacid (TUMS E-X PO) Take 2 tablets by mouth daily as needed (acid reflux).     . carvedilol (COREG) 25 MG tablet Take 25 mg by mouth in the morning and at bedtime.     . famotidine (PEPCID) 20 MG tablet Take 20 mg by mouth at bedtime.    Marland Kitchen guaiFENesin (MUCINEX) 600 MG 12 hr tablet Take 600 mg by mouth daily as needed for to loosen phlegm.    Marland Kitchen guaifenesin (ROBITUSSIN) 100 MG/5ML syrup Take 100 mg by mouth 3 (three) times daily as needed for cough.    Marland Kitchen HYDROcodone-acetaminophen (NORCO/VICODIN) 5-325 MG tablet Take 1 tablet by mouth every 4 (four) hours as needed for moderate pain. 20 tablet 0  . iron sucrose in sodium chloride 0.9 % 100 mL Iron Sucrose (Venofer)    . loratadine (CLARITIN) 10 MG tablet     . loratadine (CLARITIN) 10 MG tablet Take 10 mg by mouth daily.    Marland Kitchen losartan (COZAAR) 50 MG tablet Take 50 mg by mouth daily.     . Methoxy PEG-Epoetin Beta (MIRCERA IJ) Mircera    . Methoxy PEG-Epoetin Beta (MIRCERA IJ) Mircera    . ofloxacin (OCUFLOX) 0.3 % ophthalmic solution Place into the left eye.    . prednisoLONE acetate (PRED FORTE) 1 % ophthalmic suspension Place into the left eye.    Marland Kitchen RELION INSULIN SYR 0.5ML/31G 31G X 5/16" 0.5 ML MISC USE 1 ONCE DAILY    . rosuvastatin (CRESTOR) 10 MG tablet Take 10 mg by mouth daily.     Marland Kitchen triamcinolone cream (KENALOG) 0.1 % Apply 1 application topically daily as needed (rash).      No current facility-administered medications on file prior to visit.    Allergies  Allergen Reactions  . Penicillin G     Other reaction(s): rash/hives  . Penicillins Rash    Did it involve swelling of the face/tongue/throat, SOB, or low BP? No Did it involve sudden or severe rash/hives, skin peeling, or any reaction on the inside of your mouth or nose? Yes Did you need to seek medical attention at a hospital or doctor's  office? N/A When did it last happen?N/A If all above answers are "NO", may proceed with cephalosporin use.    Family History  Problem Relation Age of Onset  . Diabetes Mother     BP (!) 150/62 (BP Location: Left Arm, Patient Position: Sitting, Cuff Size: Large)   Pulse 87   Ht 5' (1.524 m)   Wt 195 lb (88.5 kg)  SpO2 96%   BMI 38.08 kg/m    Review of Systems     Objective:   Physical Exam VITAL SIGNS:  See vs page GENERAL: no distress Pulses: dorsalis pedis intact bilat.   MSK: no deformity of the feet CV: no leg edema Skin:  no ulcer on the feet.  normal color and temp on the feet. Neuro: sensation is intact to touch on the feet, but decreased from normal.    Lab Results  Component Value Date   HGBA1C 8.1 (A) 11/29/2020       Assessment & Plan:  Insulin-requiring type 2 DM, with ESRD: uncontrolled.  Hypoglycemia, due to insulin: this limits aggressiveness of glycemic control. She needs to adjust for HD vs non-HD days.    Patient Instructions  check your blood sugar twice a day.  vary the time of day when you check, between before the 3 meals, and at bedtime.  also check if you have symptoms of your blood sugar being too high or too low.  please keep a record of the readings and bring it to your next appointment here (or you can bring the meter itself).  You can write it on any piece of paper.  please call us sooner if your blood sugar goes below 70, or if you have a lot of readings over 200. Please take 26 units with breakfast on dialysis days, and 34 units with breakfast on days off.   On this type of insulin schedule, you should eat meals on a regular schedule (especially lunch).  If a meal is missed or significantly delayed, your blood sugar could go low.   Please come back for a follow-up appointment in 2 months.

## 2021-03-03 DIAGNOSIS — Z992 Dependence on renal dialysis: Secondary | ICD-10-CM | POA: Diagnosis not present

## 2021-03-03 DIAGNOSIS — D688 Other specified coagulation defects: Secondary | ICD-10-CM | POA: Diagnosis not present

## 2021-03-03 DIAGNOSIS — N186 End stage renal disease: Secondary | ICD-10-CM | POA: Diagnosis not present

## 2021-03-03 DIAGNOSIS — R519 Headache, unspecified: Secondary | ICD-10-CM | POA: Diagnosis not present

## 2021-03-03 DIAGNOSIS — N2581 Secondary hyperparathyroidism of renal origin: Secondary | ICD-10-CM | POA: Diagnosis not present

## 2021-03-07 DIAGNOSIS — Z992 Dependence on renal dialysis: Secondary | ICD-10-CM | POA: Diagnosis not present

## 2021-03-07 DIAGNOSIS — T82868A Thrombosis of vascular prosthetic devices, implants and grafts, initial encounter: Secondary | ICD-10-CM | POA: Diagnosis not present

## 2021-03-07 DIAGNOSIS — N186 End stage renal disease: Secondary | ICD-10-CM | POA: Diagnosis not present

## 2021-03-08 DIAGNOSIS — R519 Headache, unspecified: Secondary | ICD-10-CM | POA: Diagnosis not present

## 2021-03-08 DIAGNOSIS — T8249XA Other complication of vascular dialysis catheter, initial encounter: Secondary | ICD-10-CM | POA: Diagnosis not present

## 2021-03-08 DIAGNOSIS — D631 Anemia in chronic kidney disease: Secondary | ICD-10-CM | POA: Diagnosis not present

## 2021-03-08 DIAGNOSIS — Z992 Dependence on renal dialysis: Secondary | ICD-10-CM | POA: Diagnosis not present

## 2021-03-08 DIAGNOSIS — N2581 Secondary hyperparathyroidism of renal origin: Secondary | ICD-10-CM | POA: Diagnosis not present

## 2021-03-08 DIAGNOSIS — E8770 Fluid overload, unspecified: Secondary | ICD-10-CM | POA: Diagnosis not present

## 2021-03-08 DIAGNOSIS — D688 Other specified coagulation defects: Secondary | ICD-10-CM | POA: Diagnosis not present

## 2021-03-08 DIAGNOSIS — E1129 Type 2 diabetes mellitus with other diabetic kidney complication: Secondary | ICD-10-CM | POA: Diagnosis not present

## 2021-03-08 DIAGNOSIS — N186 End stage renal disease: Secondary | ICD-10-CM | POA: Diagnosis not present

## 2021-03-09 DIAGNOSIS — E1129 Type 2 diabetes mellitus with other diabetic kidney complication: Secondary | ICD-10-CM | POA: Diagnosis not present

## 2021-03-09 DIAGNOSIS — R519 Headache, unspecified: Secondary | ICD-10-CM | POA: Diagnosis not present

## 2021-03-09 DIAGNOSIS — E8770 Fluid overload, unspecified: Secondary | ICD-10-CM | POA: Diagnosis not present

## 2021-03-09 DIAGNOSIS — Z992 Dependence on renal dialysis: Secondary | ICD-10-CM | POA: Diagnosis not present

## 2021-03-09 DIAGNOSIS — D631 Anemia in chronic kidney disease: Secondary | ICD-10-CM | POA: Diagnosis not present

## 2021-03-09 DIAGNOSIS — T8249XA Other complication of vascular dialysis catheter, initial encounter: Secondary | ICD-10-CM | POA: Diagnosis not present

## 2021-03-09 DIAGNOSIS — N186 End stage renal disease: Secondary | ICD-10-CM | POA: Diagnosis not present

## 2021-03-09 DIAGNOSIS — N2581 Secondary hyperparathyroidism of renal origin: Secondary | ICD-10-CM | POA: Diagnosis not present

## 2021-03-09 DIAGNOSIS — D688 Other specified coagulation defects: Secondary | ICD-10-CM | POA: Diagnosis not present

## 2021-03-10 DIAGNOSIS — N186 End stage renal disease: Secondary | ICD-10-CM | POA: Diagnosis not present

## 2021-03-10 DIAGNOSIS — Z992 Dependence on renal dialysis: Secondary | ICD-10-CM | POA: Diagnosis not present

## 2021-03-10 DIAGNOSIS — T8249XA Other complication of vascular dialysis catheter, initial encounter: Secondary | ICD-10-CM | POA: Diagnosis not present

## 2021-03-10 DIAGNOSIS — D631 Anemia in chronic kidney disease: Secondary | ICD-10-CM | POA: Diagnosis not present

## 2021-03-10 DIAGNOSIS — R519 Headache, unspecified: Secondary | ICD-10-CM | POA: Diagnosis not present

## 2021-03-10 DIAGNOSIS — N2581 Secondary hyperparathyroidism of renal origin: Secondary | ICD-10-CM | POA: Diagnosis not present

## 2021-03-10 DIAGNOSIS — E8770 Fluid overload, unspecified: Secondary | ICD-10-CM | POA: Diagnosis not present

## 2021-03-10 DIAGNOSIS — D688 Other specified coagulation defects: Secondary | ICD-10-CM | POA: Diagnosis not present

## 2021-03-10 DIAGNOSIS — E1129 Type 2 diabetes mellitus with other diabetic kidney complication: Secondary | ICD-10-CM | POA: Diagnosis not present

## 2021-03-12 DIAGNOSIS — R519 Headache, unspecified: Secondary | ICD-10-CM | POA: Diagnosis not present

## 2021-03-12 DIAGNOSIS — E1129 Type 2 diabetes mellitus with other diabetic kidney complication: Secondary | ICD-10-CM | POA: Diagnosis not present

## 2021-03-12 DIAGNOSIS — N186 End stage renal disease: Secondary | ICD-10-CM | POA: Diagnosis not present

## 2021-03-12 DIAGNOSIS — N2581 Secondary hyperparathyroidism of renal origin: Secondary | ICD-10-CM | POA: Diagnosis not present

## 2021-03-12 DIAGNOSIS — D631 Anemia in chronic kidney disease: Secondary | ICD-10-CM | POA: Diagnosis not present

## 2021-03-12 DIAGNOSIS — E8770 Fluid overload, unspecified: Secondary | ICD-10-CM | POA: Diagnosis not present

## 2021-03-12 DIAGNOSIS — Z992 Dependence on renal dialysis: Secondary | ICD-10-CM | POA: Diagnosis not present

## 2021-03-12 DIAGNOSIS — T8249XA Other complication of vascular dialysis catheter, initial encounter: Secondary | ICD-10-CM | POA: Diagnosis not present

## 2021-03-12 DIAGNOSIS — D688 Other specified coagulation defects: Secondary | ICD-10-CM | POA: Diagnosis not present

## 2021-03-15 ENCOUNTER — Other Ambulatory Visit: Payer: Self-pay

## 2021-03-15 DIAGNOSIS — N186 End stage renal disease: Secondary | ICD-10-CM

## 2021-03-15 DIAGNOSIS — D688 Other specified coagulation defects: Secondary | ICD-10-CM | POA: Diagnosis not present

## 2021-03-15 DIAGNOSIS — T8249XA Other complication of vascular dialysis catheter, initial encounter: Secondary | ICD-10-CM | POA: Diagnosis not present

## 2021-03-15 DIAGNOSIS — Z992 Dependence on renal dialysis: Secondary | ICD-10-CM | POA: Diagnosis not present

## 2021-03-15 DIAGNOSIS — R519 Headache, unspecified: Secondary | ICD-10-CM | POA: Diagnosis not present

## 2021-03-15 DIAGNOSIS — D509 Iron deficiency anemia, unspecified: Secondary | ICD-10-CM | POA: Diagnosis not present

## 2021-03-15 DIAGNOSIS — N2581 Secondary hyperparathyroidism of renal origin: Secondary | ICD-10-CM | POA: Diagnosis not present

## 2021-03-17 DIAGNOSIS — D688 Other specified coagulation defects: Secondary | ICD-10-CM | POA: Diagnosis not present

## 2021-03-17 DIAGNOSIS — Z992 Dependence on renal dialysis: Secondary | ICD-10-CM | POA: Diagnosis not present

## 2021-03-17 DIAGNOSIS — R519 Headache, unspecified: Secondary | ICD-10-CM | POA: Diagnosis not present

## 2021-03-17 DIAGNOSIS — T8249XA Other complication of vascular dialysis catheter, initial encounter: Secondary | ICD-10-CM | POA: Diagnosis not present

## 2021-03-17 DIAGNOSIS — N2581 Secondary hyperparathyroidism of renal origin: Secondary | ICD-10-CM | POA: Diagnosis not present

## 2021-03-17 DIAGNOSIS — N186 End stage renal disease: Secondary | ICD-10-CM | POA: Diagnosis not present

## 2021-03-17 DIAGNOSIS — D509 Iron deficiency anemia, unspecified: Secondary | ICD-10-CM | POA: Diagnosis not present

## 2021-03-18 ENCOUNTER — Encounter: Payer: Self-pay | Admitting: Vascular Surgery

## 2021-03-18 ENCOUNTER — Ambulatory Visit: Payer: Medicare Other | Admitting: Vascular Surgery

## 2021-03-18 ENCOUNTER — Ambulatory Visit (HOSPITAL_COMMUNITY)
Admission: RE | Admit: 2021-03-18 | Discharge: 2021-03-18 | Disposition: A | Discharge status: Home / Self Care | Payer: Medicare Other | Source: Ambulatory Visit | Attending: Vascular Surgery | Admitting: Vascular Surgery

## 2021-03-18 ENCOUNTER — Ambulatory Visit (INDEPENDENT_AMBULATORY_CARE_PROVIDER_SITE_OTHER)
Admission: RE | Admit: 2021-03-18 | Discharge: 2021-03-18 | Disposition: A | Discharge status: Home / Self Care | Payer: Medicare Other | Source: Ambulatory Visit | Attending: Vascular Surgery | Admitting: Vascular Surgery

## 2021-03-18 ENCOUNTER — Other Ambulatory Visit: Payer: Self-pay

## 2021-03-18 VITALS — BP 174/91 | HR 78 | Temp 98.3°F | Resp 20 | Ht 60.0 in | Wt 195.0 lb

## 2021-03-18 DIAGNOSIS — N186 End stage renal disease: Secondary | ICD-10-CM | POA: Diagnosis not present

## 2021-03-18 NOTE — Progress Notes (Signed)
Patient ID: Katie Garcia, female   DOB: May 25, 1941, 80 y.o.   MRN: [id number]  Reason for Consult: Follow-up   Referred by [name],*  Subjective:     HPI:  Katie Garcia is a 80 y.o. female with history of end-stage renal disease previously had a failed left upper extremity fistula and more recently a thrombosed right upper extremity fistula and underwent attempted thrombectomy with stenting but is now thrombosed.  She is now on dialysis via catheter.  She is here today with imaging of her bilateral upper extremities.  She is right-hand dominant.  She does not take blood thinners.  Past Medical History:  Diagnosis Date  . Anemia   . Arthritis   . Chronic kidney disease    Dialysis T/Th/Sa at [facility]  . Diabetes mellitus without complication ([place])   . GERD (gastroesophageal reflux disease)   . Gout   . Headache   . Hypertension   . Type 2 diabetes mellitus ([facility])    Family History  Problem Relation Age of Onset  . Diabetes Mother    Past Surgical History:  Procedure Laterality Date  . ABDOMINAL HYSTERECTOMY    . AV FISTULA PLACEMENT Left 10/02/2016   Procedure: ARTERIOVENOUS (AV) FISTULA CREATION LEFT UPPER ARM;  Surgeon: Waynetta Sandy, MD;  Location: [facility];  Service: Vascular;  Laterality: Left;  . AV FISTULA PLACEMENT Right 07/14/2020   Procedure: RIGHT ARM ARTERIOVENOUS FISTULA CREATION;  Surgeon: Waynetta Sandy, MD;  Location: [facility];  Service: Vascular;  Laterality: Right;  . BASCILIC VEIN TRANSPOSITION Left 05/17/2020   Procedure: Insertion of Left arm arteriovenous gortex graftarm ;  Surgeon: Angelia Mould, MD;  Location: [facility] OR;  Service: Vascular;  Laterality: Left;  . CESAREAN SECTION    . DIALYSIS/PERMA CATHETER INSERTION    . REVISON OF ARTERIOVENOUS FISTULA Right 09/15/2020   Procedure: REVISON OF RIGHT UPPER ARM  ARTERIOVENOUS FISTULA;  Surgeon: Waynetta Sandy, MD;  Location: [place];  Service: Vascular;   Laterality: Right;    Short Social History:  Social History   Tobacco Use  . Smoking status: Never Smoker  . Smokeless tobacco: Never Used  Substance Use Topics  . Alcohol use: Not Currently    Allergies  Allergen Reactions  . Penicillin G     Other reaction(s): rash/hives  . Penicillins Rash    Did it involve swelling of the face/tongue/throat, SOB, or low BP? No Did it involve sudden or severe rash/hives, skin peeling, or any reaction on the inside of your mouth or nose? Yes Did you need to seek medical attention at a hospital or doctor's office? N/A When did it last happen?N/A If all above answers are "NO", may proceed with cephalosporin use.    Current Outpatient Medications  Medication Sig Dispense Refill  . Accu-Chek FastClix Lancets MISC     . ACCU-CHEK GUIDE test strip 1 each by Other route 4 (four) times daily. E11.22  1  . acetaminophen (OFIRMEV) 10 MG/ML SOLN Take by mouth.    Marland Kitchen acetaminophen (TYLENOL) 500 MG tablet Take 1,000 mg by mouth every 6 (six) hours as needed for moderate pain or headache.    . allopurinol (ZYLOPRIM) 100 MG tablet Take 100 mg by mouth daily.     . B Complex-C-Zn-Folic Acid (DIALYVITE/ZINC) TABS Take 1 tablet by mouth every evening.   3  . Blood Glucose Monitoring Suppl (ACCU-CHEK GUIDE) w/Device KIT     . calcium acetate (PHOSLO) 667 MG  capsule Take 1,334 mg by mouth 2 (two) times daily with a meal.   3  . Calcium Carbonate Antacid (TUMS E-X PO) Take 2 tablets by mouth daily as needed (acid reflux).     . carvedilol (COREG) 25 MG tablet Take 25 mg by mouth in the morning and at bedtime.     . famotidine (PEPCID) 20 MG tablet Take 20 mg by mouth at bedtime.    Marland Kitchen guaiFENesin (MUCINEX) 600 MG 12 hr tablet Take 600 mg by mouth daily as needed for to loosen phlegm.    Marland Kitchen guaifenesin (ROBITUSSIN) 100 MG/5ML syrup Take 100 mg by mouth 3 (three) times daily as needed for cough.    Marland Kitchen HYDROcodone-acetaminophen (NORCO/VICODIN) 5-325 MG tablet  Take 1 tablet by mouth every 4 (four) hours as needed for moderate pain. 20 tablet 0  . insulin NPH-regular Human (70-30) 100 UNIT/ML injection 26 units with breakfast on dialysis days, and 34 units with breakfast on days off. 20 mL 11  . iron sucrose in sodium chloride 0.9 % 100 mL Iron Sucrose (Venofer)    . loratadine (CLARITIN) 10 MG tablet     . loratadine (CLARITIN) 10 MG tablet Take 10 mg by mouth daily.    Marland Kitchen losartan (COZAAR) 50 MG tablet Take 50 mg by mouth daily.     . Methoxy PEG-Epoetin Beta (MIRCERA IJ) Mircera    . Methoxy PEG-Epoetin Beta (MIRCERA IJ) Mircera    . ofloxacin (OCUFLOX) 0.3 % ophthalmic solution Place into the left eye.    . prednisoLONE acetate (PRED FORTE) 1 % ophthalmic suspension Place into the left eye.    Marland Kitchen RELION INSULIN SYR 0.5ML/31G 31G X 5/16" 0.5 ML MISC USE 1 ONCE DAILY    . rosuvastatin (CRESTOR) 10 MG tablet Take 10 mg by mouth daily.     Marland Kitchen triamcinolone cream (KENALOG) 0.1 % Apply 1 application topically daily as needed (rash).      No current facility-administered medications for this visit.    Review of Systems  Constitutional:  Constitutional negative. HENT: HENT negative.  Eyes: Eyes negative.  Respiratory: Respiratory negative.  Cardiovascular: Cardiovascular negative.  GI: Gastrointestinal negative.  Musculoskeletal: Musculoskeletal negative.  Skin: Skin negative.  Neurological: Neurological negative. Hematologic: Positive for bruises/bleeds easily.        Objective:  Objective   Vitals:   03/18/21 0848  BP: (!) 174/91  Pulse: 78  Resp: 20  Temp: 98.3 F (36.8 C)  SpO2: 98%  Weight: 195 lb (88.5 kg)  Height: 5' (1.524 m)   Body mass index is 38.08 kg/m.  Physical Exam HENT:     Head: Normocephalic.     Nose:     Comments: Wearing a mask Eyes:     Pupils: Pupils are equal, round, and reactive to light.  Cardiovascular:     Rate and Rhythm: Normal rate.     Pulses:          Radial pulses are 2+ on the right side  and 2+ on the left side.  Pulmonary:     Effort: Pulmonary effort is normal.  Abdominal:     General: Abdomen is flat.  Musculoskeletal:        General: Normal range of motion.     Comments: Small hematoma from infiltration right upper extremity near the fistula  Skin:    General: Skin is warm and dry.     Capillary Refill: Capillary refill takes less than 2 seconds.  Neurological:  General: No focal deficit present.     Mental Status: She is alert.  Psychiatric:        Mood and Affect: Mood normal.        Behavior: Behavior normal.        Thought Content: Thought content normal.        Judgment: Judgment normal.     Data: Right Pre-Dialysis Findings:  +-----------------------+----------+--------------------+--------+--------+   Location        PSV (cm/s)Intralum. Diam.  (cm)WaveformComments  +-----------------------+----------+--------------------+--------+--------+   Brachial Antecub. fossa142    0.51                    +-----------------------+----------+--------------------+--------+--------+   Radial Art at Wrist  71    0.17                    +-----------------------+----------+--------------------+--------+--------+   Ulnar Art at Wrist   73    0.22                    +-----------------------+----------+--------------------+--------+--------+    Left Pre-Dialysis Findings:  +-----------------------+----------+--------------------+--------+--------+   Location        PSV (cm/s)Intralum. Diam.  (cm)WaveformComments  +-----------------------+----------+--------------------+--------+--------+   Brachial Antecub. fossa133    0.57                    +-----------------------+----------+--------------------+--------+--------+   Radial Art at Wrist  69    0.17                     +-----------------------+----------+--------------------+--------+--------+   Ulnar Art at Wrist   70    0.20                    +-----------------------+----------+--------------------+--------+--------+   +-----------------+-------------+----------+----------------+  Right Cephalic  Diameter (cm)Depth (cm)  Findings    +-----------------+-------------+----------+----------------+  Shoulder                 Old AVF occluded  +-----------------+-------------+----------+----------------+  Prox upper arm              Old AVF occluded  +-----------------+-------------+----------+----------------+  Mid upper arm              Old AVF occluded  +-----------------+-------------+----------+----------------+  Dist upper arm              Old AVF occluded  +-----------------+-------------+----------+----------------+  Antecubital fossa            Old AVF occluded  +-----------------+-------------+----------+----------------+  Prox forearm                not visualized   +-----------------+-------------+----------+----------------+  Mid forearm                not visualized   +-----------------+-------------+----------+----------------+  Dist forearm                not visualized   +-----------------+-------------+----------+----------------+  Wrist                   not visualized   +-----------------+-------------+----------+----------------+   +-----------------+-------------+----------+---------+  Right Basilic  Diameter (cm)Depth (cm)Findings   +-----------------+-------------+----------+---------+  Prox upper arm    0.29     2.59         +-----------------+-------------+----------+---------+  Mid upper arm    0.27     2.72          +-----------------+-------------+----------+---------+  Dist upper arm    0.28     1.75         +-----------------+-------------+----------+---------+  Antecubital fossa  0.27     1.55  branching  +-----------------+-------------+----------+---------+  Prox forearm     0.19     0.68         +-----------------+-------------+----------+---------+  Mid forearm     0.11     0.22         +-----------------+-------------+----------+---------+  Distal forearm    0.14     0.20         +-----------------+-------------+----------+---------+   +-----------------+-------------+----------+----------------+  Left Cephalic  Diameter (cm)Depth (cm)  Findings    +-----------------+-------------+----------+----------------+  Shoulder                 Old AVF occluded  +-----------------+-------------+----------+----------------+  Prox upper arm              Old AVF occluded  +-----------------+-------------+----------+----------------+  Mid upper arm              Old AVF occluded  +-----------------+-------------+----------+----------------+  Dist upper arm              Old AVF occluded  +-----------------+-------------+----------+----------------+  Antecubital fossa            Old AVF occluded  +-----------------+-------------+----------+----------------+  Prox forearm                not visualized   +-----------------+-------------+----------+----------------+  Mid forearm                not visualized   +-----------------+-------------+----------+----------------+  Dist forearm                not visualized   +-----------------+-------------+----------+----------------+   +-----------------+-------------+----------+---------+  Left Basilic   Diameter (cm)Depth  (cm)Findings   +-----------------+-------------+----------+---------+  Prox upper arm    0.27     1.52         +-----------------+-------------+----------+---------+  Mid upper arm    0.30     1.69         +-----------------+-------------+----------+---------+  Dist upper arm    0.29     1.70  branching  +-----------------+-------------+----------+---------+  Antecubital fossa  0.21     1.76  branching  +-----------------+-------------+----------+---------+  Prox forearm     0.22     0.95  branching  +-----------------+-------------+----------+---------+  Mid forearm     0.17     0.21         +-----------------+-------------+----------+---------+  Distal forearm    0.13     0.21         +-----------------+-------------+----------+---------+      Assessment/Plan:     79 year old female presents for evaluation of recently thrombosed right upper extremity fistula now dialyzing via catheter.  Also the cephalic vein fistula that is thrombosed.  Unfortunately does not appear to have any suitable vein given bilateral upper extremity previously failed fistulas we will start with bilateral upper extremity venography and plan further access from there.  I do not think she would be a good candidate for femoral loop graft.  I discussed procedure with patient and her daughter and they demonstrate good understanding we will get her scheduled on a Monday in the near future.     Waynetta Sandy MD [facility]

## 2021-03-19 DIAGNOSIS — T8249XA Other complication of vascular dialysis catheter, initial encounter: Secondary | ICD-10-CM | POA: Diagnosis not present

## 2021-03-19 DIAGNOSIS — D688 Other specified coagulation defects: Secondary | ICD-10-CM | POA: Diagnosis not present

## 2021-03-19 DIAGNOSIS — R519 Headache, unspecified: Secondary | ICD-10-CM | POA: Diagnosis not present

## 2021-03-19 DIAGNOSIS — N2581 Secondary hyperparathyroidism of renal origin: Secondary | ICD-10-CM | POA: Diagnosis not present

## 2021-03-19 DIAGNOSIS — D509 Iron deficiency anemia, unspecified: Secondary | ICD-10-CM | POA: Diagnosis not present

## 2021-03-19 DIAGNOSIS — N186 End stage renal disease: Secondary | ICD-10-CM | POA: Diagnosis not present

## 2021-03-19 DIAGNOSIS — Z992 Dependence on renal dialysis: Secondary | ICD-10-CM | POA: Diagnosis not present

## 2021-03-21 ENCOUNTER — Other Ambulatory Visit: Payer: Self-pay

## 2021-03-21 MED ORDER — SODIUM CHLORIDE 0.9 % IV SOLN: 250.0000 mL | INTRAVENOUS | Status: AC | PRN

## 2021-03-21 MED ORDER — SODIUM CHLORIDE 0.9% FLUSH: 3.0000 mL | INTRAVENOUS | Status: DC | PRN

## 2021-03-21 MED ORDER — SODIUM CHLORIDE 0.9% FLUSH: 3.0000 mL | Freq: Two times a day (BID) | INTRAVENOUS | Status: DC

## 2021-03-22 DIAGNOSIS — J45901 Unspecified asthma with (acute) exacerbation: Secondary | ICD-10-CM | POA: Diagnosis not present

## 2021-03-22 DIAGNOSIS — D509 Iron deficiency anemia, unspecified: Secondary | ICD-10-CM | POA: Diagnosis not present

## 2021-03-22 DIAGNOSIS — Z992 Dependence on renal dialysis: Secondary | ICD-10-CM | POA: Diagnosis not present

## 2021-03-22 DIAGNOSIS — K219 Gastro-esophageal reflux disease without esophagitis: Secondary | ICD-10-CM | POA: Diagnosis not present

## 2021-03-22 DIAGNOSIS — N186 End stage renal disease: Secondary | ICD-10-CM | POA: Diagnosis not present

## 2021-03-22 DIAGNOSIS — I129 Hypertensive chronic kidney disease with stage 1 through stage 4 chronic kidney disease, or unspecified chronic kidney disease: Secondary | ICD-10-CM | POA: Diagnosis not present

## 2021-03-22 DIAGNOSIS — T8249XA Other complication of vascular dialysis catheter, initial encounter: Secondary | ICD-10-CM | POA: Diagnosis not present

## 2021-03-22 DIAGNOSIS — N185 Chronic kidney disease, stage 5: Secondary | ICD-10-CM | POA: Diagnosis not present

## 2021-03-22 DIAGNOSIS — N2581 Secondary hyperparathyroidism of renal origin: Secondary | ICD-10-CM | POA: Diagnosis not present

## 2021-03-22 DIAGNOSIS — D688 Other specified coagulation defects: Secondary | ICD-10-CM | POA: Diagnosis not present

## 2021-03-22 DIAGNOSIS — E785 Hyperlipidemia, unspecified: Secondary | ICD-10-CM | POA: Diagnosis not present

## 2021-03-22 DIAGNOSIS — D631 Anemia in chronic kidney disease: Secondary | ICD-10-CM | POA: Diagnosis not present

## 2021-03-22 DIAGNOSIS — E1122 Type 2 diabetes mellitus with diabetic chronic kidney disease: Secondary | ICD-10-CM | POA: Diagnosis not present

## 2021-03-22 DIAGNOSIS — I10 Essential (primary) hypertension: Secondary | ICD-10-CM | POA: Diagnosis not present

## 2021-03-22 DIAGNOSIS — R519 Headache, unspecified: Secondary | ICD-10-CM | POA: Diagnosis not present

## 2021-03-22 DIAGNOSIS — J45998 Other asthma: Secondary | ICD-10-CM | POA: Diagnosis not present

## 2021-03-24 DIAGNOSIS — D631 Anemia in chronic kidney disease: Secondary | ICD-10-CM | POA: Diagnosis not present

## 2021-03-24 DIAGNOSIS — R519 Headache, unspecified: Secondary | ICD-10-CM | POA: Diagnosis not present

## 2021-03-24 DIAGNOSIS — T8249XA Other complication of vascular dialysis catheter, initial encounter: Secondary | ICD-10-CM | POA: Diagnosis not present

## 2021-03-24 DIAGNOSIS — D688 Other specified coagulation defects: Secondary | ICD-10-CM | POA: Diagnosis not present

## 2021-03-24 DIAGNOSIS — D509 Iron deficiency anemia, unspecified: Secondary | ICD-10-CM | POA: Diagnosis not present

## 2021-03-24 DIAGNOSIS — E1122 Type 2 diabetes mellitus with diabetic chronic kidney disease: Secondary | ICD-10-CM | POA: Diagnosis not present

## 2021-03-24 DIAGNOSIS — N186 End stage renal disease: Secondary | ICD-10-CM | POA: Diagnosis not present

## 2021-03-24 DIAGNOSIS — N2581 Secondary hyperparathyroidism of renal origin: Secondary | ICD-10-CM | POA: Diagnosis not present

## 2021-03-24 DIAGNOSIS — Z992 Dependence on renal dialysis: Secondary | ICD-10-CM | POA: Diagnosis not present

## 2021-03-26 DIAGNOSIS — T8249XA Other complication of vascular dialysis catheter, initial encounter: Secondary | ICD-10-CM | POA: Diagnosis not present

## 2021-03-26 DIAGNOSIS — Z992 Dependence on renal dialysis: Secondary | ICD-10-CM | POA: Diagnosis not present

## 2021-03-26 DIAGNOSIS — N2581 Secondary hyperparathyroidism of renal origin: Secondary | ICD-10-CM | POA: Diagnosis not present

## 2021-03-26 DIAGNOSIS — N186 End stage renal disease: Secondary | ICD-10-CM | POA: Diagnosis not present

## 2021-03-26 DIAGNOSIS — D688 Other specified coagulation defects: Secondary | ICD-10-CM | POA: Diagnosis not present

## 2021-03-26 DIAGNOSIS — D509 Iron deficiency anemia, unspecified: Secondary | ICD-10-CM | POA: Diagnosis not present

## 2021-03-29 DIAGNOSIS — Z4802 Encounter for removal of sutures: Secondary | ICD-10-CM | POA: Insufficient documentation

## 2021-03-29 DIAGNOSIS — D688 Other specified coagulation defects: Secondary | ICD-10-CM | POA: Diagnosis not present

## 2021-03-29 DIAGNOSIS — N186 End stage renal disease: Secondary | ICD-10-CM | POA: Diagnosis not present

## 2021-03-29 DIAGNOSIS — T8249XA Other complication of vascular dialysis catheter, initial encounter: Secondary | ICD-10-CM | POA: Diagnosis not present

## 2021-03-29 DIAGNOSIS — N2581 Secondary hyperparathyroidism of renal origin: Secondary | ICD-10-CM | POA: Diagnosis not present

## 2021-03-29 DIAGNOSIS — D509 Iron deficiency anemia, unspecified: Secondary | ICD-10-CM | POA: Diagnosis not present

## 2021-03-29 DIAGNOSIS — Z992 Dependence on renal dialysis: Secondary | ICD-10-CM | POA: Diagnosis not present

## 2021-03-31 DIAGNOSIS — D509 Iron deficiency anemia, unspecified: Secondary | ICD-10-CM | POA: Diagnosis not present

## 2021-03-31 DIAGNOSIS — Z992 Dependence on renal dialysis: Secondary | ICD-10-CM | POA: Diagnosis not present

## 2021-03-31 DIAGNOSIS — N186 End stage renal disease: Secondary | ICD-10-CM | POA: Diagnosis not present

## 2021-03-31 DIAGNOSIS — N2581 Secondary hyperparathyroidism of renal origin: Secondary | ICD-10-CM | POA: Diagnosis not present

## 2021-03-31 DIAGNOSIS — D688 Other specified coagulation defects: Secondary | ICD-10-CM | POA: Diagnosis not present

## 2021-03-31 DIAGNOSIS — T8249XA Other complication of vascular dialysis catheter, initial encounter: Secondary | ICD-10-CM | POA: Diagnosis not present

## 2021-04-02 DIAGNOSIS — D509 Iron deficiency anemia, unspecified: Secondary | ICD-10-CM | POA: Diagnosis not present

## 2021-04-02 DIAGNOSIS — T8249XA Other complication of vascular dialysis catheter, initial encounter: Secondary | ICD-10-CM | POA: Diagnosis not present

## 2021-04-02 DIAGNOSIS — Z992 Dependence on renal dialysis: Secondary | ICD-10-CM | POA: Diagnosis not present

## 2021-04-02 DIAGNOSIS — N186 End stage renal disease: Secondary | ICD-10-CM | POA: Diagnosis not present

## 2021-04-02 DIAGNOSIS — D688 Other specified coagulation defects: Secondary | ICD-10-CM | POA: Diagnosis not present

## 2021-04-02 DIAGNOSIS — N2581 Secondary hyperparathyroidism of renal origin: Secondary | ICD-10-CM | POA: Diagnosis not present

## 2021-04-05 DIAGNOSIS — N186 End stage renal disease: Secondary | ICD-10-CM | POA: Diagnosis not present

## 2021-04-05 DIAGNOSIS — N2581 Secondary hyperparathyroidism of renal origin: Secondary | ICD-10-CM | POA: Diagnosis not present

## 2021-04-05 DIAGNOSIS — T8249XA Other complication of vascular dialysis catheter, initial encounter: Secondary | ICD-10-CM | POA: Diagnosis not present

## 2021-04-05 DIAGNOSIS — Z992 Dependence on renal dialysis: Secondary | ICD-10-CM | POA: Diagnosis not present

## 2021-04-05 DIAGNOSIS — R519 Headache, unspecified: Secondary | ICD-10-CM | POA: Diagnosis not present

## 2021-04-05 DIAGNOSIS — D688 Other specified coagulation defects: Secondary | ICD-10-CM | POA: Diagnosis not present

## 2021-04-07 DIAGNOSIS — Z992 Dependence on renal dialysis: Secondary | ICD-10-CM | POA: Diagnosis not present

## 2021-04-07 DIAGNOSIS — R519 Headache, unspecified: Secondary | ICD-10-CM | POA: Diagnosis not present

## 2021-04-07 DIAGNOSIS — N2581 Secondary hyperparathyroidism of renal origin: Secondary | ICD-10-CM | POA: Diagnosis not present

## 2021-04-07 DIAGNOSIS — T8249XA Other complication of vascular dialysis catheter, initial encounter: Secondary | ICD-10-CM | POA: Diagnosis not present

## 2021-04-07 DIAGNOSIS — D688 Other specified coagulation defects: Secondary | ICD-10-CM | POA: Diagnosis not present

## 2021-04-07 DIAGNOSIS — N186 End stage renal disease: Secondary | ICD-10-CM | POA: Diagnosis not present

## 2021-04-09 DIAGNOSIS — D688 Other specified coagulation defects: Secondary | ICD-10-CM | POA: Diagnosis not present

## 2021-04-09 DIAGNOSIS — Z992 Dependence on renal dialysis: Secondary | ICD-10-CM | POA: Diagnosis not present

## 2021-04-09 DIAGNOSIS — T8249XA Other complication of vascular dialysis catheter, initial encounter: Secondary | ICD-10-CM | POA: Diagnosis not present

## 2021-04-09 DIAGNOSIS — N186 End stage renal disease: Secondary | ICD-10-CM | POA: Diagnosis not present

## 2021-04-09 DIAGNOSIS — R519 Headache, unspecified: Secondary | ICD-10-CM | POA: Diagnosis not present

## 2021-04-09 DIAGNOSIS — N2581 Secondary hyperparathyroidism of renal origin: Secondary | ICD-10-CM | POA: Diagnosis not present

## 2021-04-12 DIAGNOSIS — T8249XA Other complication of vascular dialysis catheter, initial encounter: Secondary | ICD-10-CM | POA: Diagnosis not present

## 2021-04-12 DIAGNOSIS — E1129 Type 2 diabetes mellitus with other diabetic kidney complication: Secondary | ICD-10-CM | POA: Diagnosis not present

## 2021-04-12 DIAGNOSIS — N2581 Secondary hyperparathyroidism of renal origin: Secondary | ICD-10-CM | POA: Diagnosis not present

## 2021-04-12 DIAGNOSIS — Z992 Dependence on renal dialysis: Secondary | ICD-10-CM | POA: Diagnosis not present

## 2021-04-12 DIAGNOSIS — N186 End stage renal disease: Secondary | ICD-10-CM | POA: Diagnosis not present

## 2021-04-12 DIAGNOSIS — R519 Headache, unspecified: Secondary | ICD-10-CM | POA: Diagnosis not present

## 2021-04-12 DIAGNOSIS — D688 Other specified coagulation defects: Secondary | ICD-10-CM | POA: Diagnosis not present

## 2021-04-14 DIAGNOSIS — D688 Other specified coagulation defects: Secondary | ICD-10-CM | POA: Diagnosis not present

## 2021-04-14 DIAGNOSIS — R519 Headache, unspecified: Secondary | ICD-10-CM | POA: Diagnosis not present

## 2021-04-14 DIAGNOSIS — Z992 Dependence on renal dialysis: Secondary | ICD-10-CM | POA: Diagnosis not present

## 2021-04-14 DIAGNOSIS — N186 End stage renal disease: Secondary | ICD-10-CM | POA: Diagnosis not present

## 2021-04-14 DIAGNOSIS — N2581 Secondary hyperparathyroidism of renal origin: Secondary | ICD-10-CM | POA: Diagnosis not present

## 2021-04-14 DIAGNOSIS — T8249XA Other complication of vascular dialysis catheter, initial encounter: Secondary | ICD-10-CM | POA: Diagnosis not present

## 2021-04-14 DIAGNOSIS — E1129 Type 2 diabetes mellitus with other diabetic kidney complication: Secondary | ICD-10-CM | POA: Diagnosis not present

## 2021-04-15 ENCOUNTER — Other Ambulatory Visit (HOSPITAL_COMMUNITY)
Admission: RE | Admit: 2021-04-15 | Discharge: 2021-04-15 | Disposition: A | Discharge status: Home / Self Care | Payer: Medicare Other | Source: Ambulatory Visit | Attending: Vascular Surgery | Admitting: Vascular Surgery

## 2021-04-15 DIAGNOSIS — Z20822 Contact with and (suspected) exposure to covid-19: Secondary | ICD-10-CM | POA: Insufficient documentation

## 2021-04-15 DIAGNOSIS — Z01812 Encounter for preprocedural laboratory examination: Secondary | ICD-10-CM | POA: Insufficient documentation

## 2021-04-15 LAB — SARS CORONAVIRUS 2 (TAT 6-24 HRS): SARS Coronavirus 2: NEGATIVE

## 2021-04-16 DIAGNOSIS — R519 Headache, unspecified: Secondary | ICD-10-CM | POA: Diagnosis not present

## 2021-04-16 DIAGNOSIS — N186 End stage renal disease: Secondary | ICD-10-CM | POA: Diagnosis not present

## 2021-04-16 DIAGNOSIS — E1129 Type 2 diabetes mellitus with other diabetic kidney complication: Secondary | ICD-10-CM | POA: Diagnosis not present

## 2021-04-16 DIAGNOSIS — T8249XA Other complication of vascular dialysis catheter, initial encounter: Secondary | ICD-10-CM | POA: Diagnosis not present

## 2021-04-16 DIAGNOSIS — N2581 Secondary hyperparathyroidism of renal origin: Secondary | ICD-10-CM | POA: Diagnosis not present

## 2021-04-16 DIAGNOSIS — D688 Other specified coagulation defects: Secondary | ICD-10-CM | POA: Diagnosis not present

## 2021-04-16 DIAGNOSIS — Z992 Dependence on renal dialysis: Secondary | ICD-10-CM | POA: Diagnosis not present

## 2021-04-18 ENCOUNTER — Ambulatory Visit (HOSPITAL_COMMUNITY)
Admission: RE | Admit: 2021-04-18 | Discharge: 2021-04-18 | Disposition: A | Discharge status: Home / Self Care | Payer: Medicare Other | Attending: Vascular Surgery | Admitting: Vascular Surgery

## 2021-04-18 ENCOUNTER — Encounter (HOSPITAL_COMMUNITY)
Admission: RE | Disposition: A | Discharge status: Home / Self Care | Payer: Self-pay | Source: Home / Self Care | Attending: Vascular Surgery

## 2021-04-18 ENCOUNTER — Other Ambulatory Visit: Payer: Self-pay

## 2021-04-18 DIAGNOSIS — N186 End stage renal disease: Secondary | ICD-10-CM | POA: Diagnosis not present

## 2021-04-18 DIAGNOSIS — Z794 Long term (current) use of insulin: Secondary | ICD-10-CM | POA: Insufficient documentation

## 2021-04-18 DIAGNOSIS — Z992 Dependence on renal dialysis: Secondary | ICD-10-CM | POA: Diagnosis not present

## 2021-04-18 DIAGNOSIS — Z880 Allergy status to penicillin: Secondary | ICD-10-CM | POA: Diagnosis not present

## 2021-04-18 DIAGNOSIS — I120 Hypertensive chronic kidney disease with stage 5 chronic kidney disease or end stage renal disease: Secondary | ICD-10-CM | POA: Insufficient documentation

## 2021-04-18 DIAGNOSIS — E1122 Type 2 diabetes mellitus with diabetic chronic kidney disease: Secondary | ICD-10-CM | POA: Insufficient documentation

## 2021-04-18 DIAGNOSIS — Z79899 Other long term (current) drug therapy: Secondary | ICD-10-CM | POA: Diagnosis not present

## 2021-04-18 DIAGNOSIS — N185 Chronic kidney disease, stage 5: Secondary | ICD-10-CM | POA: Diagnosis not present

## 2021-04-18 HISTORY — PX: UPPER EXTREMITY VENOGRAPHY: CATH118272

## 2021-04-18 LAB — POCT I-STAT, CHEM 8
BUN: 70 mg/dL — ABNORMAL HIGH (ref 8–23)
Calcium, Ion: 1.11 mmol/L — ABNORMAL LOW (ref 1.15–1.40)
Chloride: 94 mmol/L — ABNORMAL LOW (ref 98–111)
Creatinine, Ser: 9 mg/dL — ABNORMAL HIGH (ref 0.44–1.00)
Glucose, Bld: 217 mg/dL — ABNORMAL HIGH (ref 70–99)
HCT: 41 % (ref 36.0–46.0)
Hemoglobin: 13.9 g/dL (ref 12.0–15.0)
Potassium: 4.7 mmol/L (ref 3.5–5.1)
Sodium: 131 mmol/L — ABNORMAL LOW (ref 135–145)
TCO2: 29 mmol/L (ref 22–32)

## 2021-04-18 SURGERY — UPPER EXTREMITY VENOGRAPHY
Anesthesia: LOCAL | Laterality: Bilateral

## 2021-04-18 MED ORDER — IODIXANOL 320 MG/ML IV SOLN
INTRAVENOUS | Status: DC | PRN
  Administered 2021-04-18: 45 mL via INTRAVENOUS

## 2021-04-18 SURGICAL SUPPLY — 2 items
STOPCOCK MORSE 400PSI 3WAY (MISCELLANEOUS) ×4 IMPLANT
TUBING CIL FLEX 10 FLL-RA (TUBING) ×4 IMPLANT

## 2021-04-18 NOTE — Op Note (Signed)
    Patient name: Katie Garcia MRN: [id number] DOB: 05-16-1941 Sex: female  04/18/2021 Pre-operative Diagnosis: End-stage renal disease Post-operative diagnosis:  Same Surgeon:  Erlene Quan C. Donzetta Matters, MD Procedure Performed:  Bilateral upper extremity venography  Indications: 80 year old female with failed bilateral upper extremity access procedures.  She now is on dialysis via right IJ catheter.  She is indicated for bilateral upper extremity venography  Findings: There is an IJ catheter on the right.  Central veins appear patent bilaterally.  Plan will be for left upper arm AV graft either brachial artery or axillary artery to axillary vein in the near future.   Procedure:  The patient was identified in the holding area and taken to room 8.  The patient was then placed supine on the table and prepped and draped in the usual sterile fashion.  A time out was called.  Bilateral 22-gauge IVs have been placed.  Bilateral upper extremity venography was performed.  With the above findings we will plan for left upper arm AV graft in the near future.  She tolerated procedure without immediate complication.   Contrast: Contrast 45 cc  [name] C. Donzetta Matters, MD [facility] [place] Office: [phone] Pager: [phone]

## 2021-04-18 NOTE — Discharge Instructions (Signed)
Venogram A venogram, or venography, is a procedure that uses an X-ray and dye (contrast) to examine how well the veins work and how blood flows through them. Contrast helps the veins show up on X-rays. A venogram may be done:  To evaluate abnormalities in the vein.  To identify clots within veins, such as deep vein thrombosis (DVT).  To map out the veins that might be needed for another procedure. Tell a health care provider about:  Any allergies you have, especially to medicines, shellfish, iodine, and contrast.  All medicines you are taking, including vitamins, herbs, eye drops, creams, and over-the-counter medicines.  Any problems you or family members have had with anesthetic medicines.  Any blood disorders you have.  Any surgeries you have had and any complications that occurred.  Any medical conditions you have.  Whether you are pregnant, may be pregnant, or are breastfeeding.  Any history of smoking or tobacco use. What are the risks? Generally, this is a safe procedure. However, problems may occur, including:  Infection.  Bleeding.  Blood clots.  Allergic reaction to medicines or contrast.  Damage to other structures or organs.  Kidney problems.  Increased risk of cancer. Being exposed to too much radiation over a lifetime can increase the risk of cancer. The risk is small. What happens before the procedure? Medicines Ask your health care provider about:  Changing or stopping your regular medicines. This is especially important if you are taking diabetes medicines or blood thinners.  Taking medicines such as aspirin and ibuprofen. These medicines can thin your blood. Do not take these medicines unless your health care provider tells you to take them.  Taking over-the-counter medicines, vitamins, herbs, and supplements. General instructions  Follow instructions from your health care provider about eating or drinking restrictions.  You may have blood tests  to check how well your kidneys and liver are working and how well your blood can clot.  Plan to have someone take you home from the hospital or clinic. What happens during the procedure?  An IV will be inserted into one of your veins.  You may be given a medicine to help you relax (sedative).  You will lie down on an X-ray table. The table may be tilted in different directions during the procedure to help the contrast move throughout your body. Safety straps will keep you secure if the table is tilted.  If veins in your arm or leg will be examined, a band may be wrapped around that arm or leg to keep the veins full of blood. This may cause your arm or leg to feel numb.  The contrast will be injected into your IV. You may have a hot, flushed feeling as it moves throughout your body. You may also have a metallic taste in your mouth. Both of these sensations will go away after the test is complete.  You may be asked to lie in different positions or place your legs or arms in different positions.  At the end of the procedure, you may be given IV fluids to help wash or flush the contrast out of your veins.  The IV will be removed, and pressure will be applied to the IV site to prevent bleeding. A bandage (dressing) may be applied to the IV site. The exact procedure may vary among health care providers and hospitals.   What can I expect after the procedure?  Your blood pressure, heart rate, breathing rate, and blood oxygen level will be monitored until   you leave the hospital or clinic.  You may be given something to eat and drink.  You may have bruising or mild discomfort in the area where the IV was inserted. Follow these instructions at home: Eating and drinking  Follow instructions from your health care provider about eating or drinking restrictions.  Drink a lot of water for the first several days after the procedure, as directed by your health care provider. This helps to flush the  contrast out of your body.   Activity  Rest as told by your health care provider.  Return to your normal activities as told by your health care provider. Ask your health care provider what activities are safe for you.  If you were given a sedative during your procedure, do not drive for 24 hours or until your health care provider approves. General instructions  Check your IV insertion area every day for signs of infection. Check for: ? Redness, swelling, or pain. ? Fluid or blood. ? Warmth. ? Pus or a bad smell.  Take over-the-counter and prescription medicines only as told by your health care provider.  Keep all follow-up visits as told by your health care provider. This is important. Contact a health care provider if:  Your skin becomes itchy or you develop a rash or hives.  You have a fever that does not get better with medicine.  You feel nauseous or you vomit.  You have redness, swelling, or pain around the insertion site.  You have fluid or blood coming from the insertion site.  Your insertion area feels warm to the touch.  You have pus or a bad smell coming from the insertion site. Get help right away if you:  Have shortness of breath or difficulty breathing.  Develop chest pain.  Faint.  Feel very dizzy. These symptoms may represent a serious problem that is an emergency. Do not wait to see if the symptoms will go away. Get medical help right away. Call your local emergency services (911 in the [place] Do not drive yourself to the hospital. Summary  A venogram, or venography, is a procedure that uses an X-ray and contrast dye to check how well the veins work and how blood flows through them.  An IV will be inserted into one of your veins in order to inject the contrast.  During the exam, you will lie on an X-ray table. The table may be tilted in different directions during the procedure to help the contrast move throughout your body. Safety straps will keep  you secure.  After the procedure, you will need to drink a lot of water to help wash or flush the contrast out of your body. This information is not intended to replace advice given to you by your health care provider. Make sure you discuss any questions you have with your health care provider. Document Revised: 07/19/2019 Document Reviewed: 07/19/2019 Elsevier Patient Education  2021 [employer]  

## 2021-04-18 NOTE — H&P (Signed)
HPI:  Katie Garcia is a 80 y.o. female with history of end-stage renal disease previously had a failed left upper extremity fistula and more recently a thrombosed right upper extremity fistula and underwent attempted thrombectomy with stenting but is now thrombosed.  She is now on dialysis via catheter.  She is here today with imaging of her bilateral upper extremities.  She is right-hand dominant.  She does not take blood thinners.      Past Medical History:  Diagnosis Date  . Anemia   . Arthritis   . Chronic kidney disease    Dialysis T/Th/Sa at [place]  . Diabetes mellitus without complication ([place])   . GERD (gastroesophageal reflux disease)   . Gout   . Headache   . Hypertension   . Type 2 diabetes mellitus (HCC)         Family History  Problem Relation Age of Onset  . Diabetes Mother         Past Surgical History:  Procedure Laterality Date  . ABDOMINAL HYSTERECTOMY    . AV FISTULA PLACEMENT Left 10/02/2016   Procedure: ARTERIOVENOUS (AV) FISTULA CREATION LEFT UPPER ARM;  Surgeon: Waynetta Sandy, MD;  Location: [place];  Service: Vascular;  Laterality: Left;  . AV FISTULA PLACEMENT Right 07/14/2020   Procedure: RIGHT ARM ARTERIOVENOUS FISTULA CREATION;  Surgeon: Waynetta Sandy, MD;  Location: [place];  Service: Vascular;  Laterality: Right;  . BASCILIC VEIN TRANSPOSITION Left 05/17/2020   Procedure: Insertion of Left arm arteriovenous gortex graftarm ;  Surgeon: Angelia Mould, MD;  Location: [facility] OR;  Service: Vascular;  Laterality: Left;  . CESAREAN SECTION    . DIALYSIS/PERMA CATHETER INSERTION    . REVISON OF ARTERIOVENOUS FISTULA Right 09/15/2020   Procedure: REVISON OF RIGHT UPPER ARM  ARTERIOVENOUS FISTULA;  Surgeon: Waynetta Sandy, MD;  Location: [place];  Service: Vascular;  Laterality: Right;    Short Social History:  Social History       Tobacco Use  . Smoking status: Never Smoker  .  Smokeless tobacco: Never Used  Substance Use Topics  . Alcohol use: Not Currently         Allergies  Allergen Reactions  . Penicillin G     Other reaction(s): rash/hives  . Penicillins Rash    Did it involve swelling of the face/tongue/throat, SOB, or low BP? No Did it involve sudden or severe rash/hives, skin peeling, or any reaction on the inside of your mouth or nose? Yes Did you need to seek medical attention at a hospital or doctor's office? N/A When did it last happen?N/A If all above answers are "NO", may proceed with cephalosporin use.          Current Outpatient Medications  Medication Sig Dispense Refill  . Accu-Chek FastClix Lancets MISC     . ACCU-CHEK GUIDE test strip 1 each by Other route 4 (four) times daily. E11.22  1  . acetaminophen (OFIRMEV) 10 MG/ML SOLN Take by mouth.    Marland Kitchen acetaminophen (TYLENOL) 500 MG tablet Take 1,000 mg by mouth every 6 (six) hours as needed for moderate pain or headache.    . allopurinol (ZYLOPRIM) 100 MG tablet Take 100 mg by mouth daily.     . B Complex-C-Zn-Folic Acid (DIALYVITE/ZINC) TABS Take 1 tablet by mouth every evening.   3  . Blood Glucose Monitoring Suppl (ACCU-CHEK GUIDE) w/Device KIT     . calcium acetate (PHOSLO) 667 MG capsule Take 1,334 mg  by mouth 2 (two) times daily with a meal.   3  . Calcium Carbonate Antacid (TUMS E-X PO) Take 2 tablets by mouth daily as needed (acid reflux).     . carvedilol (COREG) 25 MG tablet Take 25 mg by mouth in the morning and at bedtime.     . famotidine (PEPCID) 20 MG tablet Take 20 mg by mouth at bedtime.    Marland Kitchen guaiFENesin (MUCINEX) 600 MG 12 hr tablet Take 600 mg by mouth daily as needed for to loosen phlegm.    Marland Kitchen guaifenesin (ROBITUSSIN) 100 MG/5ML syrup Take 100 mg by mouth 3 (three) times daily as needed for cough.    Marland Kitchen HYDROcodone-acetaminophen (NORCO/VICODIN) 5-325 MG tablet Take 1 tablet by mouth every 4 (four) hours as needed for moderate pain.  20 tablet 0  . insulin NPH-regular Human (70-30) 100 UNIT/ML injection 26 units with breakfast on dialysis days, and 34 units with breakfast on days off. 20 mL 11  . iron sucrose in sodium chloride 0.9 % 100 mL Iron Sucrose (Venofer)    . loratadine (CLARITIN) 10 MG tablet     . loratadine (CLARITIN) 10 MG tablet Take 10 mg by mouth daily.    Marland Kitchen losartan (COZAAR) 50 MG tablet Take 50 mg by mouth daily.     . Methoxy PEG-Epoetin Beta (MIRCERA IJ) Mircera    . Methoxy PEG-Epoetin Beta (MIRCERA IJ) Mircera    . ofloxacin (OCUFLOX) 0.3 % ophthalmic solution Place into the left eye.    . prednisoLONE acetate (PRED FORTE) 1 % ophthalmic suspension Place into the left eye.    Marland Kitchen RELION INSULIN SYR 0.5ML/31G 31G X 5/16" 0.5 ML MISC USE 1 ONCE DAILY    . rosuvastatin (CRESTOR) 10 MG tablet Take 10 mg by mouth daily.     Marland Kitchen triamcinolone cream (KENALOG) 0.1 % Apply 1 application topically daily as needed (rash).      No current facility-administered medications for this visit.    Review of Systems  Constitutional:  Constitutional negative. HENT: HENT negative.  Eyes: Eyes negative.  Respiratory: Respiratory negative.  Cardiovascular: Cardiovascular negative.  GI: Gastrointestinal negative.  Musculoskeletal: Musculoskeletal negative.  Skin: Skin negative.  Neurological: Neurological negative. Hematologic: Positive for bruises/bleeds easily.        Objective:   Vitals:   04/18/21 0850  BP: (!) 184/93  Pulse: 88  Temp: 98.6 F (37 C)  SpO2: 100%     Physical Exam HENT:     Head: Normocephalic.     Nose:     Comments: Wearing a mask Eyes:     Pupils: Pupils are equal, round, and reactive to light.  Cardiovascular:     Rate and Rhythm: Normal rate.     Pulses:          Radial pulses are 2+ on the right side and 2+ on the left side.  Pulmonary:     Effort: Pulmonary effort is normal.  Abdominal:     General: Abdomen is flat.  Musculoskeletal:         General: Normal range of motion.     Comments: Small hematoma from infiltration right upper extremity near the fistula  Skin:    General: Skin is warm and dry.     Capillary Refill: Capillary refill takes less than 2 seconds.  Neurological:     General: No focal deficit present.     Mental Status: She is alert.  Psychiatric:        Mood and  Affect: Mood normal.        Behavior: Behavior normal.        Thought Content: Thought content normal.        Judgment: Judgment normal.     Data: Right Pre-Dialysis Findings:  +-----------------------+----------+--------------------+--------+--------+   Location        PSV (cm/s)Intralum. Diam.  (cm)WaveformComments  +-----------------------+----------+--------------------+--------+--------+   Brachial Antecub. fossa142    0.51                    +-----------------------+----------+--------------------+--------+--------+   Radial Art at Wrist  71    0.17                    +-----------------------+----------+--------------------+--------+--------+   Ulnar Art at Wrist   73    0.22                    +-----------------------+----------+--------------------+--------+--------+    Left Pre-Dialysis Findings:  +-----------------------+----------+--------------------+--------+--------+   Location        PSV (cm/s)Intralum. Diam.  (cm)WaveformComments  +-----------------------+----------+--------------------+--------+--------+   Brachial Antecub. [id number]    0.57                    +-----------------------+----------+--------------------+--------+--------+   Radial Art at Wrist  69    0.17                    +-----------------------+----------+--------------------+--------+--------+   Ulnar Art at Wrist   70    0.20                     +-----------------------+----------+--------------------+--------+--------+   +-----------------+-------------+----------+----------------+  Right Cephalic  Diameter (cm)Depth (cm)  Findings    +-----------------+-------------+----------+----------------+  Shoulder                 Old AVF occluded  +-----------------+-------------+----------+----------------+  Prox upper arm              Old AVF occluded  +-----------------+-------------+----------+----------------+  Mid upper arm              Old AVF occluded  +-----------------+-------------+----------+----------------+  Dist upper arm              Old AVF occluded  +-----------------+-------------+----------+----------------+  Antecubital fossa            Old AVF occluded  +-----------------+-------------+----------+----------------+  Prox forearm                not visualized   +-----------------+-------------+----------+----------------+  Mid forearm                not visualized   +-----------------+-------------+----------+----------------+  Dist forearm                not visualized   +-----------------+-------------+----------+----------------+  Wrist                   not visualized   +-----------------+-------------+----------+----------------+   +-----------------+-------------+----------+---------+  Right Basilic  Diameter (cm)Depth (cm)Findings   +-----------------+-------------+----------+---------+  Prox upper arm    0.29     2.59         +-----------------+-------------+----------+---------+  Mid upper arm    0.27     2.72         +-----------------+-------------+----------+---------+  Dist upper arm    0.28     1.75         +-----------------+-------------+----------+---------+   Antecubital fossa  0.27     1.55  branching  +-----------------+-------------+----------+---------+  Prox forearm     0.19  0.68         +-----------------+-------------+----------+---------+  Mid forearm     0.11     0.22         +-----------------+-------------+----------+---------+  Distal forearm    0.14     0.20         +-----------------+-------------+----------+---------+   +-----------------+-------------+----------+----------------+  Left Cephalic  Diameter (cm)Depth (cm)  Findings    +-----------------+-------------+----------+----------------+  Shoulder                 Old AVF occluded  +-----------------+-------------+----------+----------------+  Prox upper arm              Old AVF occluded  +-----------------+-------------+----------+----------------+  Mid upper arm              Old AVF occluded  +-----------------+-------------+----------+----------------+  Dist upper arm              Old AVF occluded  +-----------------+-------------+----------+----------------+  Antecubital fossa            Old AVF occluded  +-----------------+-------------+----------+----------------+  Prox forearm                not visualized   +-----------------+-------------+----------+----------------+  Mid forearm                not visualized   +-----------------+-------------+----------+----------------+  Dist forearm                not visualized   +-----------------+-------------+----------+----------------+   +-----------------+-------------+----------+---------+  Left Basilic   Diameter (cm)Depth (cm)Findings   +-----------------+-------------+----------+---------+  Prox upper arm    0.27     1.52          +-----------------+-------------+----------+---------+  Mid upper arm    0.30     1.69         +-----------------+-------------+----------+---------+  Dist upper arm    0.29     1.70  branching  +-----------------+-------------+----------+---------+  Antecubital fossa  0.21     1.76  branching  +-----------------+-------------+----------+---------+  Prox forearm     0.22     0.95  branching  +-----------------+-------------+----------+---------+  Mid forearm     0.17     0.21         +-----------------+-------------+----------+---------+  Distal forearm    0.13     0.21         +-----------------+-------------+----------+---------+      Assessment/Plan:    80 year old female presents for evaluation of recently thrombosed right upper extremity fistula now dialyzing via catheter.  Also the cephalic vein fistula that is thrombosed.  Unfortunately does not appear to have any suitable vein given bilateral upper extremity previously failed fistulas we will start with bilateral upper extremity venography and plan further access from there.  I do not think she would be a good candidate for femoral loop graft. Plan venography today and plan future access from there.   Austin Herd C. Donzetta Matters, MD [facility] Office: [phone] Pager: [phone]

## 2021-04-18 NOTE — Progress Notes (Signed)
Discharge instructions reviewed with pt and her daughter both voice understanding.  

## 2021-04-19 ENCOUNTER — Encounter (HOSPITAL_COMMUNITY): Payer: Self-pay | Admitting: Vascular Surgery

## 2021-04-19 DIAGNOSIS — R519 Headache, unspecified: Secondary | ICD-10-CM | POA: Diagnosis not present

## 2021-04-19 DIAGNOSIS — N186 End stage renal disease: Secondary | ICD-10-CM | POA: Diagnosis not present

## 2021-04-19 DIAGNOSIS — N2581 Secondary hyperparathyroidism of renal origin: Secondary | ICD-10-CM | POA: Diagnosis not present

## 2021-04-19 DIAGNOSIS — T8249XA Other complication of vascular dialysis catheter, initial encounter: Secondary | ICD-10-CM | POA: Diagnosis not present

## 2021-04-19 DIAGNOSIS — D688 Other specified coagulation defects: Secondary | ICD-10-CM | POA: Diagnosis not present

## 2021-04-19 DIAGNOSIS — Z992 Dependence on renal dialysis: Secondary | ICD-10-CM | POA: Diagnosis not present

## 2021-04-21 DIAGNOSIS — T8249XA Other complication of vascular dialysis catheter, initial encounter: Secondary | ICD-10-CM | POA: Diagnosis not present

## 2021-04-21 DIAGNOSIS — D688 Other specified coagulation defects: Secondary | ICD-10-CM | POA: Diagnosis not present

## 2021-04-21 DIAGNOSIS — N186 End stage renal disease: Secondary | ICD-10-CM | POA: Diagnosis not present

## 2021-04-21 DIAGNOSIS — N2581 Secondary hyperparathyroidism of renal origin: Secondary | ICD-10-CM | POA: Diagnosis not present

## 2021-04-21 DIAGNOSIS — R519 Headache, unspecified: Secondary | ICD-10-CM | POA: Diagnosis not present

## 2021-04-21 DIAGNOSIS — Z992 Dependence on renal dialysis: Secondary | ICD-10-CM | POA: Diagnosis not present

## 2021-04-22 DIAGNOSIS — E785 Hyperlipidemia, unspecified: Secondary | ICD-10-CM | POA: Diagnosis not present

## 2021-04-22 DIAGNOSIS — N186 End stage renal disease: Secondary | ICD-10-CM | POA: Diagnosis not present

## 2021-04-22 DIAGNOSIS — J45998 Other asthma: Secondary | ICD-10-CM | POA: Diagnosis not present

## 2021-04-22 DIAGNOSIS — K219 Gastro-esophageal reflux disease without esophagitis: Secondary | ICD-10-CM | POA: Diagnosis not present

## 2021-04-22 DIAGNOSIS — E1122 Type 2 diabetes mellitus with diabetic chronic kidney disease: Secondary | ICD-10-CM | POA: Diagnosis not present

## 2021-04-22 DIAGNOSIS — I129 Hypertensive chronic kidney disease with stage 1 through stage 4 chronic kidney disease, or unspecified chronic kidney disease: Secondary | ICD-10-CM | POA: Diagnosis not present

## 2021-04-22 DIAGNOSIS — I10 Essential (primary) hypertension: Secondary | ICD-10-CM | POA: Diagnosis not present

## 2021-04-22 DIAGNOSIS — N2581 Secondary hyperparathyroidism of renal origin: Secondary | ICD-10-CM | POA: Diagnosis not present

## 2021-04-22 DIAGNOSIS — D638 Anemia in other chronic diseases classified elsewhere: Secondary | ICD-10-CM | POA: Diagnosis not present

## 2021-04-22 DIAGNOSIS — J45901 Unspecified asthma with (acute) exacerbation: Secondary | ICD-10-CM | POA: Diagnosis not present

## 2021-04-23 DIAGNOSIS — R519 Headache, unspecified: Secondary | ICD-10-CM | POA: Diagnosis not present

## 2021-04-23 DIAGNOSIS — N186 End stage renal disease: Secondary | ICD-10-CM | POA: Diagnosis not present

## 2021-04-23 DIAGNOSIS — D688 Other specified coagulation defects: Secondary | ICD-10-CM | POA: Diagnosis not present

## 2021-04-23 DIAGNOSIS — N2581 Secondary hyperparathyroidism of renal origin: Secondary | ICD-10-CM | POA: Diagnosis not present

## 2021-04-23 DIAGNOSIS — E1122 Type 2 diabetes mellitus with diabetic chronic kidney disease: Secondary | ICD-10-CM | POA: Diagnosis not present

## 2021-04-23 DIAGNOSIS — T8249XA Other complication of vascular dialysis catheter, initial encounter: Secondary | ICD-10-CM | POA: Diagnosis not present

## 2021-04-23 DIAGNOSIS — Z992 Dependence on renal dialysis: Secondary | ICD-10-CM | POA: Diagnosis not present

## 2021-04-26 DIAGNOSIS — N186 End stage renal disease: Secondary | ICD-10-CM | POA: Diagnosis not present

## 2021-04-26 DIAGNOSIS — R519 Headache, unspecified: Secondary | ICD-10-CM | POA: Diagnosis not present

## 2021-04-26 DIAGNOSIS — T8249XA Other complication of vascular dialysis catheter, initial encounter: Secondary | ICD-10-CM | POA: Diagnosis not present

## 2021-04-26 DIAGNOSIS — D688 Other specified coagulation defects: Secondary | ICD-10-CM | POA: Diagnosis not present

## 2021-04-26 DIAGNOSIS — N2581 Secondary hyperparathyroidism of renal origin: Secondary | ICD-10-CM | POA: Diagnosis not present

## 2021-04-26 DIAGNOSIS — Z992 Dependence on renal dialysis: Secondary | ICD-10-CM | POA: Diagnosis not present

## 2021-04-28 DIAGNOSIS — D688 Other specified coagulation defects: Secondary | ICD-10-CM | POA: Diagnosis not present

## 2021-04-28 DIAGNOSIS — Z992 Dependence on renal dialysis: Secondary | ICD-10-CM | POA: Diagnosis not present

## 2021-04-28 DIAGNOSIS — R519 Headache, unspecified: Secondary | ICD-10-CM | POA: Diagnosis not present

## 2021-04-28 DIAGNOSIS — T8249XA Other complication of vascular dialysis catheter, initial encounter: Secondary | ICD-10-CM | POA: Diagnosis not present

## 2021-04-28 DIAGNOSIS — N186 End stage renal disease: Secondary | ICD-10-CM | POA: Diagnosis not present

## 2021-04-28 DIAGNOSIS — N2581 Secondary hyperparathyroidism of renal origin: Secondary | ICD-10-CM | POA: Diagnosis not present

## 2021-04-30 DIAGNOSIS — T8249XA Other complication of vascular dialysis catheter, initial encounter: Secondary | ICD-10-CM | POA: Diagnosis not present

## 2021-04-30 DIAGNOSIS — D688 Other specified coagulation defects: Secondary | ICD-10-CM | POA: Diagnosis not present

## 2021-04-30 DIAGNOSIS — R519 Headache, unspecified: Secondary | ICD-10-CM | POA: Diagnosis not present

## 2021-04-30 DIAGNOSIS — N2581 Secondary hyperparathyroidism of renal origin: Secondary | ICD-10-CM | POA: Diagnosis not present

## 2021-04-30 DIAGNOSIS — Z992 Dependence on renal dialysis: Secondary | ICD-10-CM | POA: Diagnosis not present

## 2021-04-30 DIAGNOSIS — N186 End stage renal disease: Secondary | ICD-10-CM | POA: Diagnosis not present

## 2021-05-03 DIAGNOSIS — N2581 Secondary hyperparathyroidism of renal origin: Secondary | ICD-10-CM | POA: Diagnosis not present

## 2021-05-03 DIAGNOSIS — N186 End stage renal disease: Secondary | ICD-10-CM | POA: Diagnosis not present

## 2021-05-03 DIAGNOSIS — Z992 Dependence on renal dialysis: Secondary | ICD-10-CM | POA: Diagnosis not present

## 2021-05-03 DIAGNOSIS — D688 Other specified coagulation defects: Secondary | ICD-10-CM | POA: Diagnosis not present

## 2021-05-03 DIAGNOSIS — T8249XA Other complication of vascular dialysis catheter, initial encounter: Secondary | ICD-10-CM | POA: Diagnosis not present

## 2021-05-03 DIAGNOSIS — R519 Headache, unspecified: Secondary | ICD-10-CM | POA: Diagnosis not present

## 2021-05-04 ENCOUNTER — Other Ambulatory Visit: Payer: Self-pay

## 2021-05-04 ENCOUNTER — Ambulatory Visit (INDEPENDENT_AMBULATORY_CARE_PROVIDER_SITE_OTHER): Payer: Medicare Other | Admitting: Endocrinology

## 2021-05-04 VITALS — BP 148/80 | HR 85 | Ht 60.0 in | Wt 198.2 lb

## 2021-05-04 DIAGNOSIS — E1365 Other specified diabetes mellitus with hyperglycemia: Secondary | ICD-10-CM

## 2021-05-04 DIAGNOSIS — IMO0002 Reserved for concepts with insufficient information to code with codable children: Secondary | ICD-10-CM

## 2021-05-04 LAB — POCT GLYCOSYLATED HEMOGLOBIN (HGB A1C): Hemoglobin A1C: 9.1 % — AB (ref 4.0–5.6)

## 2021-05-04 MED ORDER — INSULIN NPH ISOPHANE & REGULAR (70-30) 100 UNIT/ML ~~LOC~~ SUSP: 30.0000 [IU] | Freq: Every day | SUBCUTANEOUS | 3 refills | Status: DC

## 2021-05-04 NOTE — Progress Notes (Signed)
Subjective:    Patient ID: Katie Garcia, female    DOB: [birth year], 80 y.o.   MRN: [id number]  HPI Pt returns for f/u of diabetes mellitus:  DM type: Insulin-requiring type 2 (but vitiligo suggests type 1).   Dx'ed: 5397 Complications: PN, DR, and ESRD (on HD).  Therapy: insulin since 2004.   GDM: 1970 DKA: never.   Severe hypoglycemia: last episode was 2017.    Pancreatitis: never.   Pancreatic imaging: never.  SDOH: she takes human insulin, due to cost.  On HD days (morning), she takes insulin with breakfast when she gets home.   Other: she takes QD insulin, after poor results with multiple daily injections; she was changed to 70/30 qam, due to pattern of cbg's; fructosamine has confirmed A1c.   Interval history: no cbg record, but states cbg varies from 49-400.  It is in general lower on the AM of HD days than days off. There is no trend throughout the day.  pt states she feels well in general, except for fatigue.  Pt says she never misses the insulin.  No recent steroids.  She takes 28 units with breakfast on all days.   Past Medical History:  Diagnosis Date  . Anemia   . Arthritis   . Chronic kidney disease    Dialysis T/Th/Sa at [facility]  . Diabetes mellitus without complication ([place])   . GERD (gastroesophageal reflux disease)   . Gout   . Headache   . Hypertension   . Type 2 diabetes mellitus ([place])     Past Surgical History:  Procedure Laterality Date  . ABDOMINAL HYSTERECTOMY    . AV FISTULA PLACEMENT Left 10/02/2016   Procedure: ARTERIOVENOUS (AV) FISTULA CREATION LEFT UPPER ARM;  Surgeon: Waynetta Sandy, MD;  Location: [facility];  Service: Vascular;  Laterality: Left;  . AV FISTULA PLACEMENT Right 07/14/2020   Procedure: RIGHT ARM ARTERIOVENOUS FISTULA CREATION;  Surgeon: Waynetta Sandy, MD;  Location: [facility];  Service: Vascular;  Laterality: Right;  . BASCILIC VEIN TRANSPOSITION Left 05/17/2020   Procedure: Insertion of Left arm arteriovenous  gortex graftarm ;  Surgeon: Angelia Mould, MD;  Location: [facility] OR;  Service: Vascular;  Laterality: Left;  . CESAREAN SECTION    . DIALYSIS/PERMA CATHETER INSERTION    . REVISON OF ARTERIOVENOUS FISTULA Right 09/15/2020   Procedure: REVISON OF RIGHT UPPER ARM  ARTERIOVENOUS FISTULA;  Surgeon: Waynetta Sandy, MD;  Location: [facility];  Service: Vascular;  Laterality: Right;  . UPPER EXTREMITY VENOGRAPHY Bilateral 04/18/2021   Procedure: UPPER EXTREMITY VENOGRAPHY;  Surgeon: Waynetta Sandy, MD;  Location: [facility];  Service: Cardiovascular;  Laterality: Bilateral;    Social History   Socioeconomic History  . Marital status: Married    Spouse name: Not on file  . Number of children: Not on file  . Years of education: Not on file  . Highest education level: Not on file  Occupational History  . Not on file  Tobacco Use  . Smoking status: Never Smoker  . Smokeless tobacco: Never Used  Vaping Use  . Vaping Use: Never used  Substance and Sexual Activity  . Alcohol use: Not Currently  . Drug use: No  . Sexual activity: Not on file  Other Topics Concern  . Not on file  Social History Narrative  . Not on file   Social Determinants of Health   Financial Resource Strain: Not on file  Food Insecurity: Not on file  Transportation Needs: Not on file  Physical Activity: Not on file  Stress: Not on file  Social Connections: Not on file  Intimate Partner Violence: Not on file    Current Outpatient Medications on File Prior to Visit  Medication Sig Dispense Refill  . Accu-Chek FastClix Lancets MISC     . ACCU-CHEK GUIDE test strip 1 each by Other route 4 (four) times daily. E11.22  1  . acetaminophen (TYLENOL) 500 MG tablet Take 500-1,000 mg by mouth every 6 (six) hours as needed for moderate pain or headache.    . allopurinol (ZYLOPRIM) 100 MG tablet Take 100 mg by mouth every other day. After dialysis    . B Complex-C-Zn-Folic Acid (DIALYVITE/ZINC)  TABS Take 1 tablet by mouth daily with supper.  3  . Blood Glucose Monitoring Suppl (ACCU-CHEK GUIDE) w/Device KIT     . calcium acetate (PHOSLO) 667 MG capsule Take 1,334 mg by mouth with breakfast, with lunch, and with evening meal. Take 2 capsules (1334 mg) by mouth twice daily with meals on dialysis days (Tuesdays, Thursdays & Saturdays) Take 2 capsules (1334 mg) by mouth 3 times daily with meals on non-dialysis days (Sundays, Mondays, Wednesdays & Fridays)  3  . Calcium Carbonate Antacid (TUMS E-X PO) Take 2 tablets by mouth daily as needed (acid reflux).     . carvedilol (COREG) 25 MG tablet Take 25 mg by mouth in the morning and at bedtime.     . famotidine (PEPCID) 20 MG tablet Take 20 mg by mouth at bedtime.    Marland Kitchen guaiFENesin (MUCINEX) 600 MG 12 hr tablet Take 600 mg by mouth daily as needed for to loosen phlegm.    Marland Kitchen guaifenesin (ROBITUSSIN) 100 MG/5ML syrup Take 100 mg by mouth 3 (three) times daily as needed for cough.    Marland Kitchen HYDROcodone-acetaminophen (NORCO/VICODIN) 5-325 MG tablet Take 1 tablet by mouth every 4 (four) hours as needed for moderate pain. 20 tablet 0  . iron sucrose in sodium chloride 0.9 % 100 mL Iron Sucrose (Venofer)    . loratadine (CLARITIN) 10 MG tablet Take 10 mg by mouth in the morning.    . Methoxy PEG-Epoetin Beta (MIRCERA IJ) Mircera    . Methoxy PEG-Epoetin Beta (MIRCERA IJ) Mircera    . Nutritional Supplements (FEEDING SUPPLEMENT, NEPRO CARB STEADY,) LIQD Take 237 mLs by mouth every Tuesday, Thursday, and Saturday at 6 PM.    . RELION INSULIN SYR 0.5ML/31G 31G X 5/16" 0.5 ML MISC USE 1 ONCE DAILY    . rosuvastatin (CRESTOR) 10 MG tablet Take 10 mg by mouth daily. After dialysis    . triamcinolone cream (KENALOG) 0.1 % Apply 1 application topically daily as needed (skin irritation/rash).     Current Facility-Administered Medications on File Prior to Visit  Medication Dose Route Frequency Provider Last Rate Last Admin  . 0.9 %  sodium chloride infusion  250 mL  Intravenous PRN Waynetta Sandy, MD      . sodium chloride flush (NS) 0.9 % injection 3 mL  3 mL Intravenous Q12H Waynetta Sandy, MD      . sodium chloride flush (NS) 0.9 % injection 3 mL  3 mL Intravenous PRN Waynetta Sandy, MD        Allergies  Allergen Reactions  . Penicillins Rash    Did it involve swelling of the face/tongue/throat, SOB, or low BP? No Did it involve sudden or severe rash/hives, skin peeling, or any reaction on the inside of your mouth  or nose? Yes Did you need to seek medical attention at a hospital or doctor's office? N/A When did it last happen?N/A If all above answers are "NO", may proceed with cephalosporin use.    Family History  Problem Relation Age of Onset  . Diabetes Mother     BP (!) 148/80 (BP Location: Right Arm, Patient Position: Sitting, Cuff Size: Large)   Pulse 85   Ht 5' (1.524 m)   Wt 198 lb 3.2 oz (89.9 kg)   SpO2 96%   BMI 38.71 kg/m   Review of Systems     Objective:   Physical Exam VITAL SIGNS:  See vs page GENERAL: no distress Pulses: dorsalis pedis intact bilat.   MSK: no deformity of the feet CV: no leg edema Skin:  no ulcer on the feet.  normal color and temp on the feet. Neuro: sensation is intact to touch on the feet, but decreased from normal.    Lab Results  Component Value Date   HGBA1C 9.1 (A) 05/04/2021        Assessment & Plan:  Insulin-requiring type 2 DM: uncontrolled.  Hypoglycemia, due to insulin: the fact that pt is not allowed to eat at HD is complicating the rx of DM.    Patient Instructions  check your blood sugar twice a day.  vary the time of day when you check, between before the 3 meals, and at bedtime.  also check if you have symptoms of your blood sugar being too high or too low.  please keep a record of the readings and bring it to your next appointment here (or you can bring the meter itself).  You can write it on any piece of paper.  please call us sooner  if your blood sugar goes below 70, or if you have a lot of readings over 200. Please take 30 units with breakfast on all days.   On this type of insulin schedule, you should eat meals on a regular schedule (especially lunch).  If a meal is missed or significantly delayed, your blood sugar could go low.   Please come back for a follow-up appointment in 2 months.

## 2021-05-04 NOTE — Patient Instructions (Addendum)
check your blood sugar twice a day.  vary the time of day when you check, between before the 3 meals, and at bedtime.  also check if you have symptoms of your blood sugar being too high or too low.  please keep a record of the readings and bring it to your next appointment here (or you can bring the meter itself).  You can write it on any piece of paper.  please call us sooner if your blood sugar goes below 70, or if you have a lot of readings over 200. Please take 30 units with breakfast on all days.   On this type of insulin schedule, you should eat meals on a regular schedule (especially lunch).  If a meal is missed or significantly delayed, your blood sugar could go low.   Please come back for a follow-up appointment in 2 months.

## 2021-05-05 DIAGNOSIS — Z992 Dependence on renal dialysis: Secondary | ICD-10-CM | POA: Diagnosis not present

## 2021-05-05 DIAGNOSIS — T8249XA Other complication of vascular dialysis catheter, initial encounter: Secondary | ICD-10-CM | POA: Diagnosis not present

## 2021-05-05 DIAGNOSIS — N186 End stage renal disease: Secondary | ICD-10-CM | POA: Diagnosis not present

## 2021-05-05 DIAGNOSIS — R519 Headache, unspecified: Secondary | ICD-10-CM | POA: Diagnosis not present

## 2021-05-05 DIAGNOSIS — N2581 Secondary hyperparathyroidism of renal origin: Secondary | ICD-10-CM | POA: Diagnosis not present

## 2021-05-05 DIAGNOSIS — D688 Other specified coagulation defects: Secondary | ICD-10-CM | POA: Diagnosis not present

## 2021-05-07 DIAGNOSIS — T8249XA Other complication of vascular dialysis catheter, initial encounter: Secondary | ICD-10-CM | POA: Diagnosis not present

## 2021-05-07 DIAGNOSIS — N2581 Secondary hyperparathyroidism of renal origin: Secondary | ICD-10-CM | POA: Diagnosis not present

## 2021-05-07 DIAGNOSIS — D688 Other specified coagulation defects: Secondary | ICD-10-CM | POA: Diagnosis not present

## 2021-05-07 DIAGNOSIS — Z992 Dependence on renal dialysis: Secondary | ICD-10-CM | POA: Diagnosis not present

## 2021-05-07 DIAGNOSIS — R519 Headache, unspecified: Secondary | ICD-10-CM | POA: Diagnosis not present

## 2021-05-07 DIAGNOSIS — N186 End stage renal disease: Secondary | ICD-10-CM | POA: Diagnosis not present

## 2021-05-09 ENCOUNTER — Telehealth: Payer: Self-pay | Admitting: *Deleted

## 2021-05-09 ENCOUNTER — Other Ambulatory Visit: Payer: Self-pay | Admitting: *Deleted

## 2021-05-10 DIAGNOSIS — N186 End stage renal disease: Secondary | ICD-10-CM | POA: Diagnosis not present

## 2021-05-10 DIAGNOSIS — T8249XA Other complication of vascular dialysis catheter, initial encounter: Secondary | ICD-10-CM | POA: Diagnosis not present

## 2021-05-10 DIAGNOSIS — Z992 Dependence on renal dialysis: Secondary | ICD-10-CM | POA: Diagnosis not present

## 2021-05-10 DIAGNOSIS — D688 Other specified coagulation defects: Secondary | ICD-10-CM | POA: Diagnosis not present

## 2021-05-10 DIAGNOSIS — R519 Headache, unspecified: Secondary | ICD-10-CM | POA: Diagnosis not present

## 2021-05-10 DIAGNOSIS — N2581 Secondary hyperparathyroidism of renal origin: Secondary | ICD-10-CM | POA: Diagnosis not present

## 2021-05-10 NOTE — Telephone Encounter (Signed)
error 

## 2021-05-11 DIAGNOSIS — N185 Chronic kidney disease, stage 5: Secondary | ICD-10-CM | POA: Diagnosis not present

## 2021-05-11 DIAGNOSIS — E785 Hyperlipidemia, unspecified: Secondary | ICD-10-CM | POA: Diagnosis not present

## 2021-05-11 DIAGNOSIS — J45998 Other asthma: Secondary | ICD-10-CM | POA: Diagnosis not present

## 2021-05-11 DIAGNOSIS — I10 Essential (primary) hypertension: Secondary | ICD-10-CM | POA: Diagnosis not present

## 2021-05-11 DIAGNOSIS — K219 Gastro-esophageal reflux disease without esophagitis: Secondary | ICD-10-CM | POA: Diagnosis not present

## 2021-05-11 DIAGNOSIS — E1122 Type 2 diabetes mellitus with diabetic chronic kidney disease: Secondary | ICD-10-CM | POA: Diagnosis not present

## 2021-05-11 DIAGNOSIS — J45901 Unspecified asthma with (acute) exacerbation: Secondary | ICD-10-CM | POA: Diagnosis not present

## 2021-05-12 ENCOUNTER — Encounter (HOSPITAL_COMMUNITY): Payer: Self-pay | Admitting: Vascular Surgery

## 2021-05-12 DIAGNOSIS — Z992 Dependence on renal dialysis: Secondary | ICD-10-CM | POA: Diagnosis not present

## 2021-05-12 DIAGNOSIS — D688 Other specified coagulation defects: Secondary | ICD-10-CM | POA: Diagnosis not present

## 2021-05-12 DIAGNOSIS — R519 Headache, unspecified: Secondary | ICD-10-CM | POA: Diagnosis not present

## 2021-05-12 DIAGNOSIS — T8249XA Other complication of vascular dialysis catheter, initial encounter: Secondary | ICD-10-CM | POA: Diagnosis not present

## 2021-05-12 DIAGNOSIS — N2581 Secondary hyperparathyroidism of renal origin: Secondary | ICD-10-CM | POA: Diagnosis not present

## 2021-05-12 DIAGNOSIS — N186 End stage renal disease: Secondary | ICD-10-CM | POA: Diagnosis not present

## 2021-05-12 NOTE — Progress Notes (Addendum)
EKG: 04/18/21 CXR: na ECHO: 04/11/18 Stress Test: denies Cardiac Cath: denies  Fasting Blood Sugar-  44-316.  Instructed her daughter Lelon Frohlich to check BG in am and if 70 or less drink 1/2 cup juice.  Recheck in 15 min.  Gave number to call if BG remains 70 or less.  Checks Blood Sugar_2-3_ times a day  OSA/CPAP: No  ASA/Blood Thinners: No  Covid test not required  Anesthesia Review: No  Patient denies shortness of breath, fever, cough, and chest pain at PAT appointment.  Patient verbalized understanding of instructions provided today at the PAT appointment.  Patient asked to review instructions at home and day of surgery.

## 2021-05-14 DIAGNOSIS — N2581 Secondary hyperparathyroidism of renal origin: Secondary | ICD-10-CM | POA: Diagnosis not present

## 2021-05-14 DIAGNOSIS — T8249XA Other complication of vascular dialysis catheter, initial encounter: Secondary | ICD-10-CM | POA: Diagnosis not present

## 2021-05-14 DIAGNOSIS — D688 Other specified coagulation defects: Secondary | ICD-10-CM | POA: Diagnosis not present

## 2021-05-14 DIAGNOSIS — N186 End stage renal disease: Secondary | ICD-10-CM | POA: Diagnosis not present

## 2021-05-14 DIAGNOSIS — R519 Headache, unspecified: Secondary | ICD-10-CM | POA: Diagnosis not present

## 2021-05-14 DIAGNOSIS — Z992 Dependence on renal dialysis: Secondary | ICD-10-CM | POA: Diagnosis not present

## 2021-05-16 ENCOUNTER — Encounter (HOSPITAL_COMMUNITY)
Admission: RE | Disposition: A | Discharge status: Home / Self Care | Payer: Self-pay | Source: Home / Self Care | Attending: Vascular Surgery

## 2021-05-16 ENCOUNTER — Ambulatory Visit (HOSPITAL_COMMUNITY)
Admission: RE | Admit: 2021-05-16 | Discharge: 2021-05-16 | Disposition: A | Discharge status: Home / Self Care | Payer: Medicare Other | Attending: Vascular Surgery | Admitting: Vascular Surgery

## 2021-05-16 ENCOUNTER — Encounter (HOSPITAL_COMMUNITY): Payer: Self-pay | Admitting: Vascular Surgery

## 2021-05-16 ENCOUNTER — Ambulatory Visit (HOSPITAL_COMMUNITY): Payer: Medicare Other | Admitting: Anesthesiology

## 2021-05-16 ENCOUNTER — Other Ambulatory Visit: Payer: Self-pay

## 2021-05-16 DIAGNOSIS — Z794 Long term (current) use of insulin: Secondary | ICD-10-CM | POA: Insufficient documentation

## 2021-05-16 DIAGNOSIS — I120 Hypertensive chronic kidney disease with stage 5 chronic kidney disease or end stage renal disease: Secondary | ICD-10-CM | POA: Diagnosis not present

## 2021-05-16 DIAGNOSIS — Z880 Allergy status to penicillin: Secondary | ICD-10-CM | POA: Diagnosis not present

## 2021-05-16 DIAGNOSIS — Z79899 Other long term (current) drug therapy: Secondary | ICD-10-CM | POA: Diagnosis not present

## 2021-05-16 DIAGNOSIS — Z7984 Long term (current) use of oral hypoglycemic drugs: Secondary | ICD-10-CM | POA: Diagnosis not present

## 2021-05-16 DIAGNOSIS — N186 End stage renal disease: Secondary | ICD-10-CM | POA: Insufficient documentation

## 2021-05-16 DIAGNOSIS — N185 Chronic kidney disease, stage 5: Secondary | ICD-10-CM | POA: Diagnosis not present

## 2021-05-16 DIAGNOSIS — E1122 Type 2 diabetes mellitus with diabetic chronic kidney disease: Secondary | ICD-10-CM | POA: Diagnosis not present

## 2021-05-16 DIAGNOSIS — Z992 Dependence on renal dialysis: Secondary | ICD-10-CM | POA: Insufficient documentation

## 2021-05-16 DIAGNOSIS — N179 Acute kidney failure, unspecified: Secondary | ICD-10-CM | POA: Diagnosis not present

## 2021-05-16 HISTORY — PX: AV FISTULA PLACEMENT: SHX1204

## 2021-05-16 LAB — POCT I-STAT, CHEM 8
BUN: 56 mg/dL — ABNORMAL HIGH (ref 8–23)
Calcium, Ion: 1.14 mmol/L — ABNORMAL LOW (ref 1.15–1.40)
Chloride: 96 mmol/L — ABNORMAL LOW (ref 98–111)
Creatinine, Ser: 9.3 mg/dL — ABNORMAL HIGH (ref 0.44–1.00)
Glucose, Bld: 231 mg/dL — ABNORMAL HIGH (ref 70–99)
HCT: 42 % (ref 36.0–46.0)
Hemoglobin: 14.3 g/dL (ref 12.0–15.0)
Potassium: 3.9 mmol/L (ref 3.5–5.1)
Sodium: 137 mmol/L (ref 135–145)
TCO2: 31 mmol/L (ref 22–32)

## 2021-05-16 LAB — GLUCOSE, CAPILLARY
Glucose-Capillary: 255 mg/dL — ABNORMAL HIGH (ref 70–99)
Glucose-Capillary: 275 mg/dL — ABNORMAL HIGH (ref 70–99)

## 2021-05-16 SURGERY — INSERTION OF ARTERIOVENOUS (AV) GORE-TEX GRAFT ARM
Anesthesia: Monitor Anesthesia Care | Site: Arm Upper | Laterality: Left

## 2021-05-16 MED ORDER — LIDOCAINE HCL 1 % IJ SOLN
INTRAMUSCULAR | Status: DC | PRN
  Administered 2021-05-16: 30 mL via INTRADERMAL

## 2021-05-16 MED ORDER — HEPARIN SODIUM (PORCINE) 1000 UNIT/ML IJ SOLN
INTRAMUSCULAR | Status: DC | PRN
  Administered 2021-05-16: 5000 [IU] via INTRAVENOUS

## 2021-05-16 MED ORDER — ONDANSETRON HCL 4 MG/2ML IJ SOLN
INTRAMUSCULAR | Status: AC
  Filled 2021-05-16: qty 2

## 2021-05-16 MED ORDER — DEXAMETHASONE SODIUM PHOSPHATE 10 MG/ML IJ SOLN
INTRAMUSCULAR | Status: DC | PRN
  Administered 2021-05-16: 10 mg via INTRAVENOUS

## 2021-05-16 MED ORDER — CHLORHEXIDINE GLUCONATE 4 % EX LIQD: 60.0000 mL | Freq: Once | CUTANEOUS | Status: DC

## 2021-05-16 MED ORDER — ACETAMINOPHEN 10 MG/ML IV SOLN: 1000.0000 mg | Freq: Once | INTRAVENOUS | Status: DC | PRN

## 2021-05-16 MED ORDER — SODIUM CHLORIDE 0.9 % IV SOLN
INTRAVENOUS | Status: DC | PRN
  Administered 2021-05-16: 500 mL

## 2021-05-16 MED ORDER — AMISULPRIDE (ANTIEMETIC) 5 MG/2ML IV SOLN: 10.0000 mg | Freq: Once | INTRAVENOUS | Status: DC | PRN

## 2021-05-16 MED ORDER — LIDOCAINE HCL (PF) 1 % IJ SOLN
INTRAMUSCULAR | Status: AC
  Filled 2021-05-16: qty 30

## 2021-05-16 MED ORDER — OXYCODONE HCL 5 MG/5ML PO SOLN: 5.0000 mg | Freq: Once | ORAL | Status: DC | PRN

## 2021-05-16 MED ORDER — ACETAMINOPHEN 325 MG PO TABS: 325.0000 mg | ORAL_TABLET | ORAL | Status: DC | PRN

## 2021-05-16 MED ORDER — ORAL CARE MOUTH RINSE: 15.0000 mL | Freq: Once | OROMUCOSAL | Status: AC

## 2021-05-16 MED ORDER — EPHEDRINE SULFATE-NACL 50-0.9 MG/10ML-% IV SOSY
PREFILLED_SYRINGE | INTRAVENOUS | Status: DC | PRN
  Administered 2021-05-16: 10 mg via INTRAVENOUS

## 2021-05-16 MED ORDER — FENTANYL CITRATE (PF) 250 MCG/5ML IJ SOLN
INTRAMUSCULAR | Status: AC
  Filled 2021-05-16: qty 5

## 2021-05-16 MED ORDER — EPHEDRINE 5 MG/ML INJ
INTRAVENOUS | Status: AC
  Filled 2021-05-16: qty 10

## 2021-05-16 MED ORDER — SODIUM CHLORIDE 0.9 % IV SOLN
INTRAVENOUS | Status: AC
  Filled 2021-05-16: qty 1.2

## 2021-05-16 MED ORDER — LACTATED RINGERS IV SOLN: INTRAVENOUS | Status: DC

## 2021-05-16 MED ORDER — OXYCODONE HCL 5 MG PO TABS: 5.0000 mg | ORAL_TABLET | Freq: Once | ORAL | Status: DC | PRN

## 2021-05-16 MED ORDER — FENTANYL CITRATE (PF) 100 MCG/2ML IJ SOLN: 25.0000 ug | INTRAMUSCULAR | Status: DC | PRN

## 2021-05-16 MED ORDER — PHENYLEPHRINE HCL-NACL 10-0.9 MG/250ML-% IV SOLN
INTRAVENOUS | Status: DC | PRN
  Administered 2021-05-16: 50 ug/min via INTRAVENOUS

## 2021-05-16 MED ORDER — INSULIN ASPART 100 UNIT/ML IJ SOLN
INTRAMUSCULAR | Status: AC
  Filled 2021-05-16: qty 1

## 2021-05-16 MED ORDER — PHENYLEPHRINE 40 MCG/ML (10ML) SYRINGE FOR IV PUSH (FOR BLOOD PRESSURE SUPPORT)
PREFILLED_SYRINGE | INTRAVENOUS | Status: DC | PRN
  Administered 2021-05-16 (×2): 200 ug via INTRAVENOUS

## 2021-05-16 MED ORDER — PROPOFOL 10 MG/ML IV BOLUS
INTRAVENOUS | Status: AC
  Filled 2021-05-16: qty 20

## 2021-05-16 MED ORDER — CHLORHEXIDINE GLUCONATE 0.12 % MT SOLN
15.0000 mL | Freq: Once | OROMUCOSAL | Status: AC
  Administered 2021-05-16: 15 mL via OROMUCOSAL
  Filled 2021-05-16: qty 15

## 2021-05-16 MED ORDER — PROTAMINE SULFATE 10 MG/ML IV SOLN
INTRAVENOUS | Status: DC | PRN
  Administered 2021-05-16: 20 mg via INTRAVENOUS

## 2021-05-16 MED ORDER — FENTANYL CITRATE (PF) 250 MCG/5ML IJ SOLN
INTRAMUSCULAR | Status: DC | PRN
  Administered 2021-05-16: 25 ug via INTRAVENOUS
  Administered 2021-05-16: 50 ug via INTRAVENOUS
  Administered 2021-05-16: 25 ug via INTRAVENOUS

## 2021-05-16 MED ORDER — VANCOMYCIN HCL IN DEXTROSE 1-5 GM/200ML-% IV SOLN
1000.0000 mg | INTRAVENOUS | Status: AC
  Administered 2021-05-16: 1000 mg via INTRAVENOUS
  Filled 2021-05-16: qty 200

## 2021-05-16 MED ORDER — LIDOCAINE HCL 1 % IJ SOLN
INTRAMUSCULAR | Status: AC
  Filled 2021-05-16: qty 40

## 2021-05-16 MED ORDER — INSULIN ASPART 100 UNIT/ML IJ SOLN
10.0000 [IU] | Freq: Once | INTRAMUSCULAR | Status: AC
  Administered 2021-05-16: 10 [IU] via SUBCUTANEOUS

## 2021-05-16 MED ORDER — ACETAMINOPHEN 160 MG/5ML PO SOLN: 325.0000 mg | ORAL | Status: DC | PRN

## 2021-05-16 MED ORDER — 0.9 % SODIUM CHLORIDE (POUR BTL) OPTIME
TOPICAL | Status: DC | PRN
  Administered 2021-05-16: 1000 mL

## 2021-05-16 MED ORDER — MIDAZOLAM HCL 2 MG/2ML IJ SOLN
INTRAMUSCULAR | Status: AC
  Filled 2021-05-16: qty 2

## 2021-05-16 MED ORDER — ONDANSETRON HCL 4 MG/2ML IJ SOLN
INTRAMUSCULAR | Status: DC | PRN
  Administered 2021-05-16: 4 mg via INTRAVENOUS

## 2021-05-16 MED ORDER — PROPOFOL 500 MG/50ML IV EMUL
INTRAVENOUS | Status: DC | PRN
  Administered 2021-05-16: 30 ug/kg/min via INTRAVENOUS

## 2021-05-16 MED ORDER — PHENYLEPHRINE 40 MCG/ML (10ML) SYRINGE FOR IV PUSH (FOR BLOOD PRESSURE SUPPORT)
PREFILLED_SYRINGE | INTRAVENOUS | Status: AC
  Filled 2021-05-16: qty 10

## 2021-05-16 MED ORDER — PROMETHAZINE HCL 25 MG/ML IJ SOLN: 6.2500 mg | INTRAMUSCULAR | Status: DC | PRN

## 2021-05-16 MED ORDER — MIDAZOLAM HCL 2 MG/2ML IJ SOLN
INTRAMUSCULAR | Status: DC | PRN
  Administered 2021-05-16 (×2): 1 mg via INTRAVENOUS

## 2021-05-16 MED ORDER — DEXAMETHASONE SODIUM PHOSPHATE 10 MG/ML IJ SOLN
INTRAMUSCULAR | Status: AC
  Filled 2021-05-16: qty 1

## 2021-05-16 MED ORDER — HYDROCODONE-ACETAMINOPHEN 5-325 MG PO TABS: 1.0000 | ORAL_TABLET | Freq: Four times a day (QID) | ORAL | 0 refills | Status: DC | PRN

## 2021-05-16 MED ORDER — SODIUM CHLORIDE 0.9 % IV SOLN: INTRAVENOUS | Status: DC

## 2021-05-16 SURGICAL SUPPLY — 38 items
ARMBAND PINK RESTRICT EXTREMIT (MISCELLANEOUS) ×2 IMPLANT
BLADE CLIPPER SURG (BLADE) ×2 IMPLANT
CANISTER SUCT 3000ML PPV (MISCELLANEOUS) ×2 IMPLANT
CLIP VESOCCLUDE MED 6/CT (CLIP) ×2 IMPLANT
CLIP VESOCCLUDE SM WIDE 6/CT (CLIP) ×2 IMPLANT
COVER PROBE W GEL 5X96 (DRAPES) ×2 IMPLANT
DECANTER SPIKE VIAL GLASS SM (MISCELLANEOUS) ×2 IMPLANT
DERMABOND ADHESIVE PROPEN (GAUZE/BANDAGES/DRESSINGS) ×1
DERMABOND ADVANCED (GAUZE/BANDAGES/DRESSINGS) ×1
DERMABOND ADVANCED .7 DNX12 (GAUZE/BANDAGES/DRESSINGS) ×1 IMPLANT
DERMABOND ADVANCED .7 DNX6 (GAUZE/BANDAGES/DRESSINGS) ×1 IMPLANT
ELECT REM PT RETURN 9FT ADLT (ELECTROSURGICAL) ×2
ELECTRODE REM PT RTRN 9FT ADLT (ELECTROSURGICAL) ×1 IMPLANT
GLOVE BIO SURGEON STRL SZ7.5 (GLOVE) ×2 IMPLANT
GLOVE SRG 8 PF TXTR STRL LF DI (GLOVE) ×1 IMPLANT
GLOVE SURG UNDER POLY LF SZ8 (GLOVE) ×2
GOWN STRL REUS W/ TWL LRG LVL3 (GOWN DISPOSABLE) ×2 IMPLANT
GOWN STRL REUS W/ TWL XL LVL3 (GOWN DISPOSABLE) ×2 IMPLANT
GOWN STRL REUS W/TWL LRG LVL3 (GOWN DISPOSABLE) ×4
GOWN STRL REUS W/TWL XL LVL3 (GOWN DISPOSABLE) ×4
GRAFT GORETEX STRT 4-7X45 (Vascular Products) ×2 IMPLANT
HEMOSTAT SPONGE AVITENE ULTRA (HEMOSTASIS) IMPLANT
KIT BASIN OR (CUSTOM PROCEDURE TRAY) ×2 IMPLANT
KIT TURNOVER KIT B (KITS) ×2 IMPLANT
LOOP VESSEL MINI RED (MISCELLANEOUS) ×2 IMPLANT
NS IRRIG 1000ML POUR BTL (IV SOLUTION) ×2 IMPLANT
PACK CV ACCESS (CUSTOM PROCEDURE TRAY) ×2 IMPLANT
PAD ARMBOARD 7.5X6 YLW CONV (MISCELLANEOUS) ×4 IMPLANT
SUT GORETEX 6.0 TT13 (SUTURE) IMPLANT
SUT MNCRL AB 4-0 PS2 18 (SUTURE) ×4 IMPLANT
SUT PROLENE 6 0 BV (SUTURE) ×4 IMPLANT
SUT PROLENE 7 0 BV 1 (SUTURE) IMPLANT
SUT SILK 2 0 PERMA HAND 18 BK (SUTURE) IMPLANT
SUT VIC AB 3-0 SH 27 (SUTURE) ×4
SUT VIC AB 3-0 SH 27X BRD (SUTURE) ×2 IMPLANT
TOWEL GREEN STERILE (TOWEL DISPOSABLE) ×2 IMPLANT
UNDERPAD 30X36 HEAVY ABSORB (UNDERPADS AND DIAPERS) ×2 IMPLANT
WATER STERILE IRR 1000ML POUR (IV SOLUTION) ×2 IMPLANT

## 2021-05-16 NOTE — Anesthesia Preprocedure Evaluation (Addendum)
Anesthesia Evaluation  Patient identified by MRN, date of birth, ID band Patient awake    Reviewed: Allergy & Precautions, NPO status , Patient's Chart, lab work & pertinent test results  Airway Mallampati: I  TM Distance: >3 FB Neck ROM: Full    Dental  (+) Edentulous Upper, Edentulous Lower   Pulmonary asthma ,    breath sounds clear to auscultation       Cardiovascular hypertension, Pt. on home beta blockers and Pt. on medications + Peripheral Vascular Disease  + Valvular Problems/Murmurs  Rhythm:Regular Rate:Normal     Neuro/Psych  Headaches, PSYCHIATRIC DISORDERS Depression    GI/Hepatic Neg liver ROS, GERD  Medicated,  Endo/Other  diabetes, Type 2, Insulin Dependent, Oral Hypoglycemic Agents  Renal/GU ESRF and DialysisRenal disease     Musculoskeletal  (+) Arthritis ,   Abdominal Normal abdominal exam  (+)   Peds  Hematology negative hematology ROS (+)   Anesthesia Other Findings   Reproductive/Obstetrics                            Anesthesia Physical Anesthesia Plan  ASA: III  Anesthesia Plan: MAC   Post-op Pain Management:    Induction: Intravenous  PONV Risk Score and Plan: 3 and Ondansetron, Dexamethasone and Propofol infusion  Airway Management Planned: Natural Airway and Simple Face Mask  Additional Equipment: None  Intra-op Plan:   Post-operative Plan:   Informed Consent: I have reviewed the patients History and Physical, chart, labs and discussed the procedure including the risks, benefits and alternatives for the proposed anesthesia with the patient or authorized representative who has indicated his/her understanding and acceptance.       Plan Discussed with: CRNA  Anesthesia Plan Comments: (Pt prefers same anesthetic as last time. Consented for GA as well. )       Anesthesia Quick Evaluation

## 2021-05-16 NOTE — Discharge Instructions (Signed)
° °  [facility] ° °Discharge Instructions ° °AV Fistula or Graft Surgery for Dialysis Access ° °Please refer to the following instructions for your post-procedure care. Your surgeon or physician assistant will discuss any changes with you. ° °Activity ° °You may drive the day following your surgery, if you are comfortable and no longer taking prescription pain medication. Resume full activity as the soreness in your incision resolves. ° °Bathing/Showering ° °You may shower after you go home. Keep your incision dry for 48 hours. Do not soak in a bathtub, hot tub, or swim until the incision heals completely. You may not shower if you have a hemodialysis catheter. ° °Incision Care ° °Clean your incision with mild soap and water after 48 hours. Pat the area dry with a clean towel. You do not need a bandage unless otherwise instructed. Do not apply any ointments or creams to your incision. You may have skin glue on your incision. Do not peel it off. It will come off on its own in about one week. Your arm may swell a bit after surgery. To reduce swelling use pillows to elevate your arm so it is above your heart. Your doctor will tell you if you need to lightly wrap your arm with an ACE bandage. ° °Diet ° °Resume your normal diet. There are not special food restrictions following this procedure. In order to heal from your surgery, it is CRITICAL to get adequate nutrition. Your body requires vitamins, minerals, and protein. Vegetables are the best source of vitamins and minerals. Vegetables also provide the perfect balance of protein. Processed food has little nutritional value, so try to avoid this. ° °Medications ° °Resume taking all of your medications. If your incision is causing pain, you may take over-the counter pain relievers such as acetaminophen (Tylenol). If you were prescribed a stronger pain medication, please be aware these medications can cause nausea and constipation. Prevent  nausea by taking the medication with a snack or meal. Avoid constipation by drinking plenty of fluids and eating foods with high amount of fiber, such as fruits, vegetables, and grains. Do not take Tylenol if you are taking prescription pain medications. ° ° ° ° °Follow up °Your surgeon may want to see you in the office following your access surgery. If so, this will be arranged at the time of your surgery. ° °Please call us immediately for any of the following conditions: ° °Increased pain, redness, drainage (pus) from your incision site °Fever of 101 degrees or higher °Severe or worsening pain at your incision site °Hand pain or numbness. ° °Reduce your risk of vascular disease: ° °Stop smoking. If you would like help, call [facility] at 1-800-QUIT-NOW (1-800-784-8669) or  at 336-586-4000 ° °Manage your cholesterol °Maintain a desired weight °Control your diabetes °Keep your blood pressure down ° °Dialysis ° °It will take several weeks to several months for your new dialysis access to be ready for use. Your surgeon will determine when it is OK to use it. Your nephrologist will continue to direct your dialysis. You can continue to use your Permcath until your new access is ready for use. ° °If you have any questions, please call the office at [phone]. ° °

## 2021-05-16 NOTE — Op Note (Signed)
OPERATIVE NOTE   PROCEDURE: left upper arm arteriovenous graft (4 mm x 7 mm GoreTex graft)   PRE-OPERATIVE DIAGNOSIS: end stage renal disease   POST-OPERATIVE DIAGNOSIS: same  SURGEON: Marty Heck, MD  ASSISTANT(S): Roxy Horseman, PA  ANESTHESIA: MAC  ESTIMATED BLOOD LOSS: Minimal  FINDING(S): 4 mm Gore-Tex graft was sewn to the brachial artery end to side just above antecubitum and tunneled through a new tunnel just medial to her previous upper arm AV graft that is thrombosed.  The 7 mm end of the graft was sewn end to side to the axillary vein.  Good thrill at completion.  Radial signals at the wrist.  SPECIMEN(S): None  INDICATIONS:   Katie Garcia is a 80 y.o. female who presents with ESRD and need for permanent hemodialysis access.  Risk, benefits, and alternatives to access surgery were discussed.  The patient is aware the risks include but are not limited to: bleeding, infection, steal syndrome, nerve damage, ischemic monomelic neuropathy, failure to mature, and need for additional procedures.  The patient is aware of the risks and elects to proceed forward.  An assistant was needed for exposure and to expedite the case.  DESCRIPTION: After full informed written consent was obtained from the patient, the patient was brought back to the operating room and placed supine upon the operating table.  The patient was given IV antibiotics prior to proceeding.  After obtaining adequate sedation, the patient was prepped and draped in standard fashion for a left arm access procedure.  I evaluated all of the surface veins with ultrasound and they were small as indicated on pre-operative vein mapping.  I turned my attention first to the antecubitum.  Under ultrasound guidance, I identified the location of the brachial artery and marked it on the skin.  I then looked on the upper arm near the axilla and marked the brachial vein as well as axillary vein. I injected 1%  lidocaine without epinephrine at each location to obtain some anesthesia.  In total, I used 10 mL of lidocaine to obtain anesthesia.  I made a longitudinal incision over the brachial artery above the antecubitum and another longitudinal incision over the brachial vein near the axillary vein.  I dissected down through the subcutaneous tissue and  fascia carefully and was able to dissect out the brachial artery.  The artery was about 3.5 mm externally.  It was controlled proximally and distally with vessel loops.  I then dissected out the larger brachial vein through the upper arm incision near the axilla.  Externally, it appeared to be 4 mm in diameter.  I then dissected this vein proximally and distal.  II took a metal Gore tunneler and dissected from the antecubital incision to the axillary incision.  Then I delivered the 4 x 7-mm stretch Gore-Tex graft, through this metal tunneler and then pulled out the metal tunneler leaving the graft in place.  The 4-mm end was left on the brachial artery side and the 7-mm end toward the vein side.  I then gave the patient 5,000 units of IV heparin to gain anticoagulation.  After waiting 2 minutes, I placed the brachial artery under tension proximally and distally with vessel loops, made an arteriotomy and extended it with a Potts scissor.  I sewed the 4-mm end of the graft to this arteriotomy with a running stitch of 6-0 Prolene.  At this point, then I completed the anastomosis in the usual fashion.  I released the vessel  loops on the inflow and allowed the artery to decompress through the graft. There was good pulsatile bleeding through this graft.  I clamped the graft near its arterial anastomosis and sucked out all the blood in the graft and loaded the graft with heparinized saline.  At this point, I pulled the graft to appropriate length and reset my exposure of the high brachial/axillary vein. The vein was controlled with Henle clamps and opened with 11 blade scalpel and  extended with Potts scissors.  There was good venous backbleeding from the vein. I spatulated the graft to facilitate an end-to-side anastomosis.  In the process of spatulating, I cut the graft to appropriate length for this anastomosis.  This graft was sewn to the vein in an end-to-side configuration with a 6-0 Prolene.  Prior to completing this anastomosis, I allowed the vein to back bleed and then I also allowed the artery to bleed in an antegrade fashion.  I completed this anastomosis in the usual fashion.  I irrigated out both incisions.  I used surgicel snow to get hemostasis.   The graft had a good palpable thrill.  The radial and ulnar artery has good signals.  The subcutaneous tissue in each incision was reapproximated with a running stitch of 3-0 Vicryl.  The skin was then reapproximated with a running subcuticular 4-0 Monocryl.  The skin was then cleaned, dried, and Dermabond used to reinforce the skin closure.   COMPLICATIONS: None  CONDITION: Stable   Marty Heck, MD [facility] Office: [place]    05/16/2021, 9:14 AM

## 2021-05-16 NOTE — Transfer of Care (Signed)
Immediate Anesthesia Transfer of Care Note  Patient: Katie Garcia  Procedure(s) Performed: INSERTION OF LEFT UPPER ARM ARTERIOVENOUS (AV) GORE-TEX GRAFT (Left Arm Upper)  Patient Location: PACU  Anesthesia Type:MAC  Level of Consciousness: drowsy and patient cooperative  Airway & Oxygen Therapy: Patient Spontanous Breathing  Post-op Assessment: Report given to RN and Post -op Vital signs reviewed and stable  Post vital signs: Reviewed and stable  Last Vitals:  Vitals Value Taken Time  BP 113/54 05/16/21 0917  Temp    Pulse 82 05/16/21 0919  Resp 23 05/16/21 0919  SpO2 97 % 05/16/21 0919  Vitals shown include unvalidated device data.  Last Pain:  Vitals:   05/16/21 0639  TempSrc:   PainSc: 0-No pain         Complications: No complications documented.

## 2021-05-16 NOTE — H&P (Signed)
History and Physical Interval Note:  05/16/2021 7:13 AM  Katie Garcia  has presented today for surgery, with the diagnosis of esrd.  The various methods of treatment have been discussed with the patient and family. After consideration of risks, benefits and other options for treatment, the patient has consented to  Procedure(s): INSERTION OF ARTERIOVENOUS (AV) GORE-TEX GRAFT ARM LEFT (Left) as a surgical intervention.  The patient's history has been reviewed, patient examined, no change in status, stable for surgery.  I have reviewed the patient's chart and labs.  Questions were answered to the patient's satisfaction.    Left arm AVF vs graft   Marty Heck  Patient ID: Katie Garcia, female   DOB: [birth year], 80 y.o.   MRN: [id number]  Reason for Consult: Follow-up   Referred by [name],*  Subjective:    Subjective [] Expand by Default   HPI:  Katie Garcia is a 80 y.o. female with history of end-stage renal disease previously had a failed left upper extremity fistula and more recently a thrombosed right upper extremity fistula and underwent attempted thrombectomy with stenting but is now thrombosed.  She is now on dialysis via catheter.  She is here today with imaging of her bilateral upper extremities.  She is right-hand dominant.  She does not take blood thinners.      Past Medical History:  Diagnosis Date  . Anemia   . Arthritis   . Chronic kidney disease    Dialysis T/Th/Sa at [facility]  . Diabetes mellitus without complication ([place])   . GERD (gastroesophageal reflux disease)   . Gout   . Headache   . Hypertension   . Type 2 diabetes mellitus (H[facility])         Family History  Problem Relation Age of Onset  . Diabetes Mother         Past Surgical History:  Procedure Laterality Date  . ABDOMINAL HYSTERECTOMY    . AV FISTULA PLACEMENT Left 10/02/2016   Procedure: ARTERIOVENOUS (AV) FISTULA CREATION LEFT UPPER ARM;  Surgeon:  Waynetta Sandy, MD;  Location: [facility];  Service: Vascular;  Laterality: Left;  . AV FISTULA PLACEMENT Right 07/14/2020   Procedure: RIGHT ARM ARTERIOVENOUS FISTULA CREATION;  Surgeon: Waynetta Sandy, MD;  Location: [facility];  Service: Vascular;  Laterality: Right;  . BASCILIC VEIN TRANSPOSITION Left 05/17/2020   Procedure: Insertion of Left arm arteriovenous gortex graftarm ;  Surgeon: Angelia Mould, MD;  Location: [facility] OR;  Service: Vascular;  Laterality: Left;  . CESAREAN SECTION    . DIALYSIS/PERMA CATHETER INSERTION    . REVISON OF ARTERIOVENOUS FISTULA Right 09/15/2020   Procedure: REVISON OF RIGHT UPPER ARM  ARTERIOVENOUS FISTULA;  Surgeon: Waynetta Sandy, MD;  Location: [facility];  Service: Vascular;  Laterality: Right;    Short Social History:  Social History       Tobacco Use  . Smoking status: Never Smoker  . Smokeless tobacco: Never Used  Substance Use Topics  . Alcohol use: Not Currently    Allergies  Allergen Reactions  . Penicillin G     Other reaction(s): rash/hives  . Penicillins Rash    Did it involve swelling of the face/tongue/throat, SOB, or low BP? No Did it involve sudden or severe rash/hives, skin peeling, or any reaction on the inside of your mouth or nose? Yes Did you need to seek medical attention at a hospital or doctor's office? N/A When did it last happen?N/A If all  above answers are "NO", may proceed with cephalosporin use.          Current Outpatient Medications  Medication Sig Dispense Refill  . Accu-Chek FastClix Lancets MISC     . ACCU-CHEK GUIDE test strip 1 each by Other route 4 (four) times daily. E11.22  1  . acetaminophen (OFIRMEV) 10 MG/ML SOLN Take by mouth.    Marland Kitchen acetaminophen (TYLENOL) 500 MG tablet Take 1,000 mg by mouth every 6 (six) hours as needed for moderate pain or headache.    . allopurinol (ZYLOPRIM) 100 MG tablet Take 100 mg by mouth daily.     . B  Complex-C-Zn-Folic Acid (DIALYVITE/ZINC) TABS Take 1 tablet by mouth every evening.   3  . Blood Glucose Monitoring Suppl (ACCU-CHEK GUIDE) w/Device KIT     . calcium acetate (PHOSLO) 667 MG capsule Take 1,334 mg by mouth 2 (two) times daily with a meal.   3  . Calcium Carbonate Antacid (TUMS E-X PO) Take 2 tablets by mouth daily as needed (acid reflux).     . carvedilol (COREG) 25 MG tablet Take 25 mg by mouth in the morning and at bedtime.     . famotidine (PEPCID) 20 MG tablet Take 20 mg by mouth at bedtime.    Marland Kitchen guaiFENesin (MUCINEX) 600 MG 12 hr tablet Take 600 mg by mouth daily as needed for to loosen phlegm.    Marland Kitchen guaifenesin (ROBITUSSIN) 100 MG/5ML syrup Take 100 mg by mouth 3 (three) times daily as needed for cough.    Marland Kitchen HYDROcodone-acetaminophen (NORCO/VICODIN) 5-325 MG tablet Take 1 tablet by mouth every 4 (four) hours as needed for moderate pain. 20 tablet 0  . insulin NPH-regular Human (70-30) 100 UNIT/ML injection 26 units with breakfast on dialysis days, and 34 units with breakfast on days off. 20 mL 11  . iron sucrose in sodium chloride 0.9 % 100 mL Iron Sucrose (Venofer)    . loratadine (CLARITIN) 10 MG tablet     . loratadine (CLARITIN) 10 MG tablet Take 10 mg by mouth daily.    Marland Kitchen losartan (COZAAR) 50 MG tablet Take 50 mg by mouth daily.     . Methoxy PEG-Epoetin Beta (MIRCERA IJ) Mircera    . Methoxy PEG-Epoetin Beta (MIRCERA IJ) Mircera    . ofloxacin (OCUFLOX) 0.3 % ophthalmic solution Place into the left eye.    . prednisoLONE acetate (PRED FORTE) 1 % ophthalmic suspension Place into the left eye.    Marland Kitchen RELION INSULIN SYR 0.5ML/31G 31G X 5/16" 0.5 ML MISC USE 1 ONCE DAILY    . rosuvastatin (CRESTOR) 10 MG tablet Take 10 mg by mouth daily.     Marland Kitchen triamcinolone cream (KENALOG) 0.1 % Apply 1 application topically daily as needed (rash).      No current facility-administered medications for this visit.    Review of Systems   Constitutional:  Constitutional negative. HENT: HENT negative.  Eyes: Eyes negative.  Respiratory: Respiratory negative.  Cardiovascular: Cardiovascular negative.  GI: Gastrointestinal negative.  Musculoskeletal: Musculoskeletal negative.  Skin: Skin negative.  Neurological: Neurological negative. Hematologic: Positive for bruises/bleeds easily.        Objective:   Objective        Vitals:   03/18/21 0848  BP: (!) 174/91  Pulse: 78  Resp: 20  Temp: 98.3 F (36.8 C)  SpO2: 98%  Weight: 195 lb (88.5 kg)  Height: 5' (1.524 m)   Body mass index is 38.08 kg/m.  Physical Exam HENT:  Head: Normocephalic.     Nose:     Comments: Wearing a mask Eyes:     Pupils: Pupils are equal, round, and reactive to light.  Cardiovascular:     Rate and Rhythm: Normal rate.     Pulses:          Radial pulses are 2+ on the right side and 2+ on the left side.  Pulmonary:     Effort: Pulmonary effort is normal.  Abdominal:     General: Abdomen is flat.  Musculoskeletal:        General: Normal range of motion.     Comments: Small hematoma from infiltration right upper extremity near the fistula  Skin:    General: Skin is warm and dry.     Capillary Refill: Capillary refill takes less than 2 seconds.  Neurological:     General: No focal deficit present.     Mental Status: She is alert.  Psychiatric:        Mood and Affect: Mood normal.        Behavior: Behavior normal.        Thought Content: Thought content normal.        Judgment: Judgment normal.     Data: Right Pre-Dialysis Findings:  +-----------------------+----------+--------------------+--------+--------+   Location        PSV (cm/s)Intralum. Diam.  (cm)WaveformComments  +-----------------------+----------+--------------------+--------+--------+   Brachial Antecub. fossa142    0.51                     +-----------------------+----------+--------------------+--------+--------+   Radial Art at Wrist  71    0.17                    +-----------------------+----------+--------------------+--------+--------+   Ulnar Art at Wrist   73    0.22                    +-----------------------+----------+--------------------+--------+--------+    Left Pre-Dialysis Findings:  +-----------------------+----------+--------------------+--------+--------+   Location        PSV (cm/s)Intralum. Diam.  (cm)WaveformComments  +-----------------------+----------+--------------------+--------+--------+   Brachial Antecub. fossa133    0.57                    +-----------------------+----------+--------------------+--------+--------+   Radial Art at Wrist  69    0.17                    +-----------------------+----------+--------------------+--------+--------+   Ulnar Art at Wrist   70    0.20                    +-----------------------+----------+--------------------+--------+--------+   +-----------------+-------------+----------+----------------+  Right Cephalic  Diameter (cm)Depth (cm)  Findings    +-----------------+-------------+----------+----------------+  Shoulder                 Old AVF occluded  +-----------------+-------------+----------+----------------+  Prox upper arm              Old AVF occluded  +-----------------+-------------+----------+----------------+  Mid upper arm              Old AVF occluded  +-----------------+-------------+----------+----------------+  Dist upper arm              Old AVF occluded  +-----------------+-------------+----------+----------------+  Antecubital fossa            Old AVF occluded   +-----------------+-------------+----------+----------------+  Prox forearm                not visualized   +-----------------+-------------+----------+----------------+  Mid forearm  not visualized   +-----------------+-------------+----------+----------------+  Dist forearm                not visualized   +-----------------+-------------+----------+----------------+  Wrist                   not visualized   +-----------------+-------------+----------+----------------+   +-----------------+-------------+----------+---------+  Right Basilic  Diameter (cm)Depth (cm)Findings   +-----------------+-------------+----------+---------+  Prox upper arm    0.29     2.59         +-----------------+-------------+----------+---------+  Mid upper arm    0.27     2.72         +-----------------+-------------+----------+---------+  Dist upper arm    0.28     1.75         +-----------------+-------------+----------+---------+  Antecubital fossa  0.27     1.55  branching  +-----------------+-------------+----------+---------+  Prox forearm     0.19     0.68         +-----------------+-------------+----------+---------+  Mid forearm     0.11     0.22         +-----------------+-------------+----------+---------+  Distal forearm    0.14     0.20         +-----------------+-------------+----------+---------+   +-----------------+-------------+----------+----------------+  Left Cephalic  Diameter (cm)Depth (cm)  Findings    +-----------------+-------------+----------+----------------+  Shoulder                 Old AVF occluded  +-----------------+-------------+----------+----------------+  Prox upper arm              Old AVF occluded   +-----------------+-------------+----------+----------------+  Mid upper arm              Old AVF occluded  +-----------------+-------------+----------+----------------+  Dist upper arm              Old AVF occluded  +-----------------+-------------+----------+----------------+  Antecubital fossa            Old AVF occluded  +-----------------+-------------+----------+----------------+  Prox forearm                not visualized   +-----------------+-------------+----------+----------------+  Mid forearm                not visualized   +-----------------+-------------+----------+----------------+  Dist forearm                not visualized   +-----------------+-------------+----------+----------------+   +-----------------+-------------+----------+---------+  Left Basilic   Diameter (cm)Depth (cm)Findings   +-----------------+-------------+----------+---------+  Prox upper arm    0.27     1.52         +-----------------+-------------+----------+---------+  Mid upper arm    0.30     1.69         +-----------------+-------------+----------+---------+  Dist upper arm    0.29     1.70  branching  +-----------------+-------------+----------+---------+  Antecubital fossa  0.21     1.76  branching  +-----------------+-------------+----------+---------+  Prox forearm     0.22     0.95  branching  +-----------------+-------------+----------+---------+  Mid forearm     0.17     0.21         +-----------------+-------------+----------+---------+  Distal forearm    0.13     0.21         +-----------------+-------------+----------+---------+      Assessment/Plan:   Assessment    80 year old female presents for evaluation of recently thrombosed right upper extremity fistula  now dialyzing via catheter.  Also the cephalic vein fistula that is thrombosed.  Unfortunately does not appear to have any  suitable vein given bilateral upper extremity previously failed fistulas we will start with bilateral upper extremity venography and plan further access from there.  I do not think she would be a good candidate for femoral loop graft.  I discussed procedure with patient and her daughter and they demonstrate good understanding we will get her scheduled on a Monday in the near future.     Waynetta Sandy MD [facility]

## 2021-05-16 NOTE — Anesthesia Postprocedure Evaluation (Signed)
Anesthesia Post Note  Patient: Larosa Rhines Jordan  Procedure(s) Performed: INSERTION OF LEFT UPPER ARM ARTERIOVENOUS (AV) GORE-TEX GRAFT (Left Arm Upper)     Patient location during evaluation: PACU Anesthesia Type: MAC Level of consciousness: awake and alert Pain management: pain level controlled Vital Signs Assessment: post-procedure vital signs reviewed and stable Respiratory status: spontaneous breathing, nonlabored ventilation, respiratory function stable and patient connected to nasal cannula oxygen Cardiovascular status: stable and blood pressure returned to baseline Postop Assessment: no apparent nausea or vomiting Anesthetic complications: no   No complications documented.  Last Vitals:  Vitals:   05/16/21 0932 05/16/21 0939  BP: 130/62 137/80  Pulse: 78 78  Resp: 18 16  Temp:  36.7 C  SpO2: 95% 97%    Last Pain:  Vitals:   05/16/21 0939  TempSrc:   PainSc: 0-No pain                 Effie Berkshire

## 2021-05-17 ENCOUNTER — Encounter (HOSPITAL_COMMUNITY): Payer: Self-pay | Admitting: Vascular Surgery

## 2021-05-17 DIAGNOSIS — R519 Headache, unspecified: Secondary | ICD-10-CM | POA: Diagnosis not present

## 2021-05-17 DIAGNOSIS — N2581 Secondary hyperparathyroidism of renal origin: Secondary | ICD-10-CM | POA: Diagnosis not present

## 2021-05-17 DIAGNOSIS — T8249XA Other complication of vascular dialysis catheter, initial encounter: Secondary | ICD-10-CM | POA: Diagnosis not present

## 2021-05-17 DIAGNOSIS — N186 End stage renal disease: Secondary | ICD-10-CM | POA: Diagnosis not present

## 2021-05-17 DIAGNOSIS — D688 Other specified coagulation defects: Secondary | ICD-10-CM | POA: Diagnosis not present

## 2021-05-17 DIAGNOSIS — Z992 Dependence on renal dialysis: Secondary | ICD-10-CM | POA: Diagnosis not present

## 2021-05-19 ENCOUNTER — Telehealth: Payer: Self-pay

## 2021-05-19 DIAGNOSIS — Z992 Dependence on renal dialysis: Secondary | ICD-10-CM | POA: Diagnosis not present

## 2021-05-19 DIAGNOSIS — N2581 Secondary hyperparathyroidism of renal origin: Secondary | ICD-10-CM | POA: Diagnosis not present

## 2021-05-19 DIAGNOSIS — N186 End stage renal disease: Secondary | ICD-10-CM | POA: Diagnosis not present

## 2021-05-19 DIAGNOSIS — T8249XA Other complication of vascular dialysis catheter, initial encounter: Secondary | ICD-10-CM | POA: Diagnosis not present

## 2021-05-19 DIAGNOSIS — D688 Other specified coagulation defects: Secondary | ICD-10-CM | POA: Diagnosis not present

## 2021-05-19 DIAGNOSIS — R519 Headache, unspecified: Secondary | ICD-10-CM | POA: Diagnosis not present

## 2021-05-19 NOTE — Telephone Encounter (Signed)
HD called to report loss of bruit and thrill in left arm AVG placed on 05/16/21. Discussed with Dr. Scot Dock and Dr. Carlis Abbott - thrombectomy not indicated, use [facility] and schedule in a month. Pt dialyses on TTHS. She has some pain and swelling in her arm. Placed on PA schedule. Advised elevation and to call for sooner appointment if pain/numbness/coldness in her hand or if ulcers develop.

## 2021-05-21 DIAGNOSIS — R519 Headache, unspecified: Secondary | ICD-10-CM | POA: Diagnosis not present

## 2021-05-21 DIAGNOSIS — D688 Other specified coagulation defects: Secondary | ICD-10-CM | POA: Diagnosis not present

## 2021-05-21 DIAGNOSIS — N186 End stage renal disease: Secondary | ICD-10-CM | POA: Diagnosis not present

## 2021-05-21 DIAGNOSIS — Z992 Dependence on renal dialysis: Secondary | ICD-10-CM | POA: Diagnosis not present

## 2021-05-21 DIAGNOSIS — N2581 Secondary hyperparathyroidism of renal origin: Secondary | ICD-10-CM | POA: Diagnosis not present

## 2021-05-21 DIAGNOSIS — T8249XA Other complication of vascular dialysis catheter, initial encounter: Secondary | ICD-10-CM | POA: Diagnosis not present

## 2021-05-24 ENCOUNTER — Encounter (HOSPITAL_COMMUNITY): Payer: Self-pay | Admitting: Emergency Medicine

## 2021-05-24 ENCOUNTER — Emergency Department (HOSPITAL_COMMUNITY): Payer: Medicare Other

## 2021-05-24 ENCOUNTER — Emergency Department (HOSPITAL_BASED_OUTPATIENT_CLINIC_OR_DEPARTMENT_OTHER): Payer: Medicare Other

## 2021-05-24 ENCOUNTER — Other Ambulatory Visit: Payer: Self-pay

## 2021-05-24 ENCOUNTER — Emergency Department (HOSPITAL_COMMUNITY)
Admission: EM | Admit: 2021-05-24 | Discharge: 2021-05-25 | Disposition: A | Discharge status: Home / Self Care | Payer: Medicare Other | Attending: Emergency Medicine | Admitting: Emergency Medicine

## 2021-05-24 DIAGNOSIS — Z794 Long term (current) use of insulin: Secondary | ICD-10-CM | POA: Insufficient documentation

## 2021-05-24 DIAGNOSIS — Y841 Kidney dialysis as the cause of abnormal reaction of the patient, or of later complication, without mention of misadventure at the time of the procedure: Secondary | ICD-10-CM | POA: Insufficient documentation

## 2021-05-24 DIAGNOSIS — T82868A Thrombosis of vascular prosthetic devices, implants and grafts, initial encounter: Secondary | ICD-10-CM | POA: Diagnosis not present

## 2021-05-24 DIAGNOSIS — Z992 Dependence on renal dialysis: Secondary | ICD-10-CM | POA: Insufficient documentation

## 2021-05-24 DIAGNOSIS — R42 Dizziness and giddiness: Secondary | ICD-10-CM | POA: Insufficient documentation

## 2021-05-24 DIAGNOSIS — Z8616 Personal history of COVID-19: Secondary | ICD-10-CM | POA: Diagnosis not present

## 2021-05-24 DIAGNOSIS — R519 Headache, unspecified: Secondary | ICD-10-CM | POA: Diagnosis not present

## 2021-05-24 DIAGNOSIS — I120 Hypertensive chronic kidney disease with stage 5 chronic kidney disease or end stage renal disease: Secondary | ICD-10-CM | POA: Insufficient documentation

## 2021-05-24 DIAGNOSIS — R531 Weakness: Secondary | ICD-10-CM | POA: Diagnosis not present

## 2021-05-24 DIAGNOSIS — Z79899 Other long term (current) drug therapy: Secondary | ICD-10-CM | POA: Insufficient documentation

## 2021-05-24 DIAGNOSIS — N186 End stage renal disease: Secondary | ICD-10-CM | POA: Diagnosis not present

## 2021-05-24 DIAGNOSIS — T8249XA Other complication of vascular dialysis catheter, initial encounter: Secondary | ICD-10-CM | POA: Diagnosis not present

## 2021-05-24 DIAGNOSIS — R0989 Other specified symptoms and signs involving the circulatory and respiratory systems: Secondary | ICD-10-CM | POA: Diagnosis not present

## 2021-05-24 DIAGNOSIS — E1165 Type 2 diabetes mellitus with hyperglycemia: Secondary | ICD-10-CM | POA: Insufficient documentation

## 2021-05-24 DIAGNOSIS — E1122 Type 2 diabetes mellitus with diabetic chronic kidney disease: Secondary | ICD-10-CM | POA: Insufficient documentation

## 2021-05-24 DIAGNOSIS — T82898A Other specified complication of vascular prosthetic devices, implants and grafts, initial encounter: Secondary | ICD-10-CM

## 2021-05-24 DIAGNOSIS — D688 Other specified coagulation defects: Secondary | ICD-10-CM | POA: Diagnosis not present

## 2021-05-24 DIAGNOSIS — N2581 Secondary hyperparathyroidism of renal origin: Secondary | ICD-10-CM | POA: Diagnosis not present

## 2021-05-24 LAB — CBC WITH DIFFERENTIAL/PLATELET
Abs Immature Granulocytes: 0.06 10*3/uL (ref 0.00–0.07)
Basophils Absolute: 0 10*3/uL (ref 0.0–0.1)
Basophils Relative: 1 %
Eosinophils Absolute: 0 10*3/uL (ref 0.0–0.5)
Eosinophils Relative: 0 %
HCT: 38.4 % (ref 36.0–46.0)
Hemoglobin: 12.2 g/dL (ref 12.0–15.0)
Immature Granulocytes: 1 %
Lymphocytes Relative: 18 %
Lymphs Abs: 1.1 10*3/uL (ref 0.7–4.0)
MCH: 28.2 pg (ref 26.0–34.0)
MCHC: 31.8 g/dL (ref 30.0–36.0)
MCV: 88.7 fL (ref 80.0–100.0)
Monocytes Absolute: 0.5 10*3/uL (ref 0.1–1.0)
Monocytes Relative: 8 %
Neutro Abs: 4.7 10*3/uL (ref 1.7–7.7)
Neutrophils Relative %: 72 %
Platelets: 155 10*3/uL (ref 150–400)
RBC: 4.33 MIL/uL (ref 3.87–5.11)
RDW: 16.3 % — ABNORMAL HIGH (ref 11.5–15.5)
WBC: 6.4 10*3/uL (ref 4.0–10.5)
nRBC: 0 % (ref 0.0–0.2)

## 2021-05-24 LAB — BASIC METABOLIC PANEL
Anion gap: 14 (ref 5–15)
BUN: 23 mg/dL (ref 8–23)
CO2: 28 mmol/L (ref 22–32)
Calcium: 9.1 mg/dL (ref 8.9–10.3)
Chloride: 90 mmol/L — ABNORMAL LOW (ref 98–111)
Creatinine, Ser: 5.58 mg/dL — ABNORMAL HIGH (ref 0.44–1.00)
GFR, Estimated: 7 mL/min — ABNORMAL LOW (ref >60)
Glucose, Bld: 400 mg/dL — ABNORMAL HIGH (ref 70–99)
Potassium: 3.7 mmol/L (ref 3.5–5.1)
Sodium: 132 mmol/L — ABNORMAL LOW (ref 135–145)

## 2021-05-24 LAB — CBG MONITORING, ED: Glucose-Capillary: 410 mg/dL — ABNORMAL HIGH (ref 70–99)

## 2021-05-24 MED ORDER — INSULIN ASPART 100 UNIT/ML IJ SOLN
6.0000 [IU] | Freq: Once | INTRAMUSCULAR | Status: AC
  Administered 2021-05-24: 6 [IU] via INTRAVENOUS

## 2021-05-24 MED ORDER — LORAZEPAM 2 MG/ML IJ SOLN
1.0000 mg | Freq: Once | INTRAMUSCULAR | Status: DC | PRN
  Filled 2021-05-24: qty 1

## 2021-05-24 MED ORDER — ONDANSETRON 4 MG PO TBDP
4.0000 mg | ORAL_TABLET | Freq: Once | ORAL | Status: AC
  Administered 2021-05-24: 4 mg via ORAL
  Filled 2021-05-24: qty 1

## 2021-05-24 NOTE — ED Triage Notes (Signed)
Pt had a fistula placed on Monday. Since surgery pt reports she's been having intermittent headaches, dizziness and nausea. Pt reports "it feels like I'm drunk." Denies headache at this time. Pt also reporting left fingers are numb from surgery. No drainage to surgical wound, pt reports she is also unable to feel the thrill anymore - which you could feel initially after surgery. Hx of diabetes. Pt does dialysis T/Th/Sat - completed dialysis today - dizziness started after dialysis.

## 2021-05-24 NOTE — ED Provider Notes (Signed)
Emergency Medicine Provider Triage Evaluation Note  Katie Garcia , a 80 y.o. female  was evaluated in triage.  Pt complains of generalized weakness, dizziness, and headaches since Monday when she had vascular graft placed to LUE for dialysis. She went to dialysis today however they were unable to obtain thrill; she finished dialysis through her port however states that she continues to feel like she is drunk. Also complains of tingling to her left arm..  Review of Systems  Positive: + dizziness, weakness, HA, post op compliations Negative: - fevers, chills  Physical Exam  BP 119/77 (BP Location: Right Leg)   Pulse 100   Temp 99 F (37.2 C) (Oral)   Resp 16   Ht 5' (1.524 m)   Wt 88.5 kg   SpO2 99%   BMI 38.08 kg/m  Gen:   Awake, no distress   Resp:  Normal effort  MSK:   Moves extremities without difficulty  Other:  No thrill to graft on LUE. Neuro intact.   Medical Decision Making  Medically screening exam initiated at 6:07 PM.  Appropriate orders placed.  Katie Garcia was informed that the remainder of the evaluation will be completed by another provider, this initial triage assessment does not replace that evaluation, and the importance of remaining in the ED until their evaluation is complete.     Eustaquio Maize, PA-C 05/24/21 1814    Daleen Bo, MD 05/24/21 2300

## 2021-05-24 NOTE — ED Provider Notes (Signed)
[facility] EMERGENCY DEPARTMENT Provider Note   CSN: [id number] Arrival date & time: 05/24/21  1549     History Chief Complaint  Patient presents with  . Post-op Problem    Katie Garcia is a 80 y.o. female.  HPI Patient presents with generalized weakness and dizziness.  States she feels as if she is drunk and the room was moving around some.  Had a slight headache with it.  Also just over a week ago had AV Gore-Tex graft placed in left upper extremity by Dr. Carlis Abbott.  Occluded likely a couple days after that.  Had discussed with vascular surgery and wanted outpatient follow-up.  Patient states now continued no pulse or thrill and has some tingling in a couple of her fingers.  No redness.  No drainage.  She is a Tuesday Thursday Saturday dialysis patient and was dialyzed today.  States that she felt a little dizzy after the dialysis which is not unusual for her but the drunken feeling is unusual.  Had episodes around 10 years ago which she states was told was vertigo and was given a patch to help.  Dizziness is worse when she lays down.    Past Medical History:  Diagnosis Date  . Anemia   . Arthritis   . Chronic kidney disease    Dialysis T/Th/Sa at [facility]  . Diabetes mellitus without complication ([place])   . GERD (gastroesophageal reflux disease)   . Gout   . Headache   . Hypertension   . Type 2 diabetes mellitus [facility])     Patient Active Problem List   Diagnosis Date Noted  . Adjustment disorder with depressed mood 02/16/2021  . Chronic allergic bronchitis 02/16/2021  . Chronic serous otitis media 02/16/2021  . Dependence on renal dialysis ([place]) 02/16/2021  . Dyslipidemia 02/16/2021  . Exacerbation of asthma 02/16/2021  . Gastroesophageal reflux disease 02/16/2021  . Hyperphosphatemia 02/16/2021  . Moderate major depression, single episode ([place]) 02/16/2021  . Morbid obesity ([place]) 02/16/2021  . Vitiligo 02/16/2021  . Headache, unspecified 09/14/2020   . Infection and inflammatory reaction due to other internal prosthetic devices, implants and grafts, initial encounter ([place]) 05/27/2020  . Complication of vascular dialysis catheter 03/25/2020  . Pneumonia due to COVID-19 virus 12/20/2019  . COVID-19 virus infection 12/16/2019  . ESRD (end stage renal disease) ([place]) 12/16/2019  . Acute metabolic encephalopathy 00/37/0488  . Allergy, unspecified, initial encounter 07/28/2019  . Anaphylactic shock, unspecified, initial encounter 07/28/2019  . Essential hypertension 02/11/2019  . Mild protein-calorie malnutrition ([place]) 11/28/2018  . Chronic kidney disease (CKD), stage V ([place]) 10/28/2018  . Gout 10/28/2018  . HLD (hyperlipidemia) 10/28/2018  . Hyperparathyroidism ([place]) 10/28/2018  . Pelvic kidney 10/28/2018  . Pre-transplant evaluation for CKD (chronic kidney disease) 10/28/2018  . Proteinuria 10/28/2018  . Vertigo 10/28/2018  . Fluid overload, unspecified 10/25/2018  . Arteriovenous fistula, acquired ([place]) 10/21/2018  . Fever, unspecified 10/21/2018  . Hyperparathyroidism due to renal insufficiency ([place]) 10/21/2018  . Other specified coagulation defects ([place]) 10/21/2018  . Pruritus, unspecified 10/21/2018  . Shortness of breath 10/21/2018  . Type 2 diabetes mellitus with diabetic peripheral angiopathy without gangrene ([place]) 10/21/2018  . Ingrown nail of great toe of left foot 10/10/2018  . Murmur, cardiac 04/02/2018  . Iron deficiency anemia 02/23/2017  . Anemia in chronic kidney disease 02/09/2017  . Drug noncompliance 03/04/2014  . Diarrhea 03/04/2014  . Acute renal failure ([place]) 03/04/2014  . DM (diabetes mellitus), secondary uncontrolled ([place]) 03/04/2014  .  ARF (acute renal failure) ([place]) 03/04/2014  . Renal failure, acute ([place]) 03/04/2014    Past Surgical History:  Procedure Laterality Date  . ABDOMINAL HYSTERECTOMY    . AV FISTULA PLACEMENT Left 10/02/2016   Procedure: ARTERIOVENOUS (AV) FISTULA CREATION LEFT UPPER ARM;   Surgeon: Waynetta Sandy, MD;  Location: [facility];  Service: Vascular;  Laterality: Left;  . AV FISTULA PLACEMENT Right 07/14/2020   Procedure: RIGHT ARM ARTERIOVENOUS FISTULA CREATION;  Surgeon: Waynetta Sandy, MD;  Location: [facility];  Service: Vascular;  Laterality: Right;  . AV FISTULA PLACEMENT Left 05/16/2021   Procedure: INSERTION OF LEFT UPPER ARM ARTERIOVENOUS (AV) GORE-TEX GRAFT;  Surgeon: Marty Heck, MD;  Location: [facility];  Service: Vascular;  Laterality: Left;  . BASCILIC VEIN TRANSPOSITION Left 05/17/2020   Procedure: Insertion of Left arm arteriovenous gortex graftarm ;  Surgeon: Angelia Mould, MD;  Location: [facility] OR;  Service: Vascular;  Laterality: Left;  . CESAREAN SECTION    . DIALYSIS/PERMA CATHETER INSERTION    . REVISON OF ARTERIOVENOUS FISTULA Right 09/15/2020   Procedure: REVISON OF RIGHT UPPER ARM  ARTERIOVENOUS FISTULA;  Surgeon: Waynetta Sandy, MD;  Location: [facility];  Service: Vascular;  Laterality: Right;  . UPPER EXTREMITY VENOGRAPHY Bilateral 04/18/2021   Procedure: UPPER EXTREMITY VENOGRAPHY;  Surgeon: Waynetta Sandy, MD;  Location: [facility] CV LAB;  Service: Cardiovascular;  Laterality: Bilateral;     OB History   No obstetric history on file.     Family History  Problem Relation Age of Onset  . Diabetes Mother     Social History   Tobacco Use  . Smoking status: Never Smoker  . Smokeless tobacco: Never Used  Vaping Use  . Vaping Use: Never used  Substance Use Topics  . Alcohol use: Not Currently  . Drug use: No    Home Medications Prior to Admission medications   Medication Sig Start Date End Date Taking? Authorizing Provider  Accu-Chek FastClix Lancets MISC  03/31/18  Yes [provider]  ACCU-CHEK GUIDE test strip 1 each by Other route in the morning and at bedtime. E11.22 10/06/18  Yes [provider]  acetaminophen (TYLENOL) 500 MG tablet Take 500-1,000 mg by mouth every 6  (six) hours as needed for moderate pain or headache.   Yes [provider]  allopurinol (ZYLOPRIM) 100 MG tablet Take 100 mg by mouth at bedtime as needed (Flair up). After dialysis   Yes [provider]  B Complex-C-Zn-Folic Acid (DIALYVITE/ZINC) TABS Take 1 tablet by mouth daily with supper. 10/25/18  Yes [provider]  Blood Glucose Monitoring Suppl (ACCU-CHEK GUIDE) w/Device KIT    Yes [provider]  calcium acetate (PHOSLO) 667 MG capsule Take 2,001 mg by mouth with breakfast, with lunch, and with evening meal. Take 2 capsules (2001 mg) by mouth twice daily with meals on dialysis days (Tuesdays, Thursdays & Saturdays) Take 2 capsules (2034m) by mouth 3 times daily with meals on non-dialysis days (Sundays, Mondays, Wednesdays & Fridays) 10/24/18  Yes [provider]  Calcium Carbonate Antacid (TUMS E-X PO) Take 2 tablets by mouth daily as needed (acid reflux).    Yes [provider]  carvedilol (COREG) 25 MG tablet Take 25 mg by mouth in the morning and at bedtime.    Yes [provider]  famotidine (PEPCID) 20 MG tablet Take 20 mg by mouth at bedtime.   Yes [provider]  guaiFENesin (MUCINEX) 600 MG 12 hr tablet  Take 600 mg by mouth daily as needed for to loosen phlegm.   Yes [provider]  guaifenesin (ROBITUSSIN) 100 MG/5ML syrup Take 100 mg by mouth 3 (three) times daily as needed for cough.   Yes [provider]  HYDROcodone-acetaminophen (NORCO/VICODIN) 5-325 MG tablet Take 1 tablet by mouth every 6 (six) hours as needed for moderate pain. 05/16/21 05/16/22 Yes Ulyses Amor, PA-C  insulin NPH-regular Human (70-30) 100 UNIT/ML injection Inject 30 Units into the skin daily with breakfast. 05/04/21  Yes Renato Shin, MD  iron sucrose in sodium chloride 0.9 % 100 mL Iron Sucrose (Venofer) 10/21/20 01/12/22 Yes [provider]  loratadine (CLARITIN) 10 MG tablet Take 10 mg by mouth in the  morning. 11/12/20  Yes [provider]  losartan (COZAAR) 25 MG tablet Take 25 mg by mouth at bedtime. 04/12/21  Yes [provider]  Methoxy PEG-Epoetin Beta (MIRCERA IJ) Mircera 02/10/21 02/09/22 Yes [provider]  Nutritional Supplements (FEEDING SUPPLEMENT, NEPRO CARB STEADY,) LIQD Take 237 mLs by mouth every Tuesday, Thursday, and Saturday at 6 PM.   Yes [provider]  RELION INSULIN SYR 0.5ML/31G 31G X 5/16" 0.5 ML MISC USE 1 ONCE DAILY 03/05/19  Yes [provider]  rosuvastatin (CRESTOR) 10 MG tablet Take 10 mg by mouth daily.   Yes [provider]  triamcinolone cream (KENALOG) 0.1 % Apply 1 application topically daily as needed (skin irritation/rash). 05/28/19  Yes [provider]    Allergies    Penicillins  Review of Systems   Review of Systems  Constitutional: Negative for appetite change.  HENT: Negative for congestion.   Respiratory: Negative for shortness of breath.   Gastrointestinal: Negative for abdominal pain.  Genitourinary: Negative for flank pain.  Musculoskeletal: Negative for gait problem.  Skin: Positive for wound.  Neurological: Positive for dizziness and light-headedness. Negative for weakness.  Psychiatric/Behavioral: Negative for confusion.    Physical Exam Updated Vital Signs BP (!) 128/57 (BP Location: Right Wrist)   Pulse 85   Temp 99 F (37.2 C) (Oral)   Resp 14   Ht 5' (1.524 m)   Wt 88.5 kg   SpO2 100%   BMI 38.08 kg/m   Physical Exam Vitals and nursing note reviewed.  HENT:     Head: Atraumatic.     Mouth/Throat:     Mouth: Mucous membranes are moist.  Eyes:     Pupils: Pupils are equal, round, and reactive to light.     Comments: Mild nystagmus.  Eye movements otherwise intact.  Cardiovascular:     Rate and Rhythm: Regular rhythm.  Pulmonary:     Comments: Dialysis catheter right chest wall. Abdominal:     Tenderness: There is no abdominal tenderness.   Musculoskeletal:     Cervical back: Neck supple.     Comments: Dialysis graft left upper extremity without pulse or thrill.  Radial pulse intact at this time.  Mild paresthesias over middle and ring finger.  Good range of motion.  Skin:    General: Skin is warm.     Capillary Refill: Capillary refill takes less than 2 seconds.  Neurological:     Mental Status: She is alert and oriented to person, place, and time.     ED Results / Procedures / Treatments   Labs (all labs ordered are listed, but only abnormal results are displayed) Labs Reviewed  BASIC METABOLIC PANEL - Abnormal; Notable for the following components:      Result Value  Sodium 132 (*)    Chloride 90 (*)    Glucose, Bld 400 (*)    Creatinine, Ser 5.58 (*)    GFR, Estimated 7 (*)    All other components within normal limits  CBC WITH DIFFERENTIAL/PLATELET - Abnormal; Notable for the following components:   RDW 16.3 (*)    All other components within normal limits  CBG MONITORING, ED - Abnormal; Notable for the following components:   Glucose-Capillary 410 (*)    All other components within normal limits    EKG EKG Interpretation  Date/Time:  Tuesday May 24 2021 18:15:39 EDT Ventricular Rate:  95 PR Interval:  152 QRS Duration: 80 QT Interval:  376 QTC Calculation: 472 R Axis:   -23 Text Interpretation: Normal sinus rhythm Moderate voltage criteria for LVH, may be normal variant ( R in aVL , Cornell product ) Cannot rule out Anterior infarct , age undetermined Abnormal ECG since last tracing no significant change Confirmed by Daleen Bo [phone]) on 05/24/2021 11:00:36 PM   Radiology CT Head Wo Contrast  Result Date: 05/24/2021 CLINICAL DATA:  Headache. Dizziness and nausea. No reported injury. Recent fistula placement. EXAM: CT HEAD WITHOUT CONTRAST TECHNIQUE: Contiguous axial images were obtained from the base of the skull through the vertex without intravenous contrast. COMPARISON:  12/16/2019 head CT  and head CT angiogram. FINDINGS: Brain: No evidence of parenchymal hemorrhage or extra-axial fluid collection. No mass lesion, mass effect, or midline shift. No CT evidence of acute infarction. Nonspecific mild subcortical and periventricular white matter hypodensity, most in keeping with chronic small vessel ischemic change. Cerebral volume is age appropriate. No ventriculomegaly. Vascular: No acute abnormality. Skull: No evidence of calvarial fracture. Sinuses/Orbits: The visualized paranasal sinuses are essentially clear. Other:  The mastoid air cells are unopacified. IMPRESSION: 1. No evidence of acute intracranial abnormality. 2. Mild chronic small vessel ischemic changes in the cerebral white matter. Electronically Signed   By: Ilona Sorrel M.D.   On: 05/24/2021 19:01   VAS US DUPLEX DIALYSIS ACCESS (AVF, AVG)  Result Date: 05/24/2021 DIALYSIS ACCESS Patient Name:  WILLA BROCKS  Date of Exam:   05/24/2021 Medical Rec #: [id number]       Accession #:    [id number] Date of Birth: [birth year]        Patient Gender: F Patient Age:   21Y Exam Location:  [facility] Procedure:      VAS US DUPLEX DIALYSIS ACCESS (AVF, AVG) Referring Phys: [id number] [place] [facility] --------------------------------------------------------------------------------  Reason for Exam: No palpable thrill for AVF/AVG. Access Site: Left Upper Extremity. History: 05/16/21 - Left upper arm insertion AV Graft          09/15/20 - Revision of right upper Arm AVF. Performing Technologist: Abram Sander RVS  Examination Guidelines: A complete evaluation includes B-mode imaging, spectral Doppler, color Doppler, and power Doppler as needed of all accessible portions of each vessel. Unilateral testing is considered an integral part of a complete examination. Limited examinations for reoccurring indications may be performed as noted.  Findings:   +--------------------+----------+-----------------+--------+ AVG                 PSV (cm/s)Flow Vol  (mL/min)Describe +--------------------+----------+-----------------+--------+ Arterial anastomosis                           occluded +--------------------+----------+-----------------+--------+ Prox graft  occluded +--------------------+----------+-----------------+--------+ Mid graft                                      occluded +--------------------+----------+-----------------+--------+ Distal graft                                   occluded +--------------------+----------+-----------------+--------+ Venous anastomosis                             occluded +--------------------+----------+-----------------+--------+  Summary: Occluded left upper arm AV Graft.  *See table(s) above for measurements and observations.    --------------------------------------------------------------------------------   Preliminary     Procedures Procedures   Medications Ordered in ED Medications  LORazepam (ATIVAN) injection 1 mg (has no administration in time range)  ondansetron (ZOFRAN-ODT) disintegrating tablet 4 mg (4 mg Oral Given 05/24/21 1810)  insulin aspart (novoLOG) injection 6 Units (6 Units Intravenous Given 05/24/21 2155)    ED Course  I have reviewed the triage vital signs and the nursing notes.  Pertinent labs & imaging results that were available during my care of the patient were reviewed by me and considered in my medical decision making (see chart for details).  Clinical Course as of 05/25/21 0005  Tue May 24, 2021  [place] [phone] [place] [MV]    Clinical Course User Index [MV] Eustaquio Maize, PA-C   MDM Rules/Calculators/A&P                          Patient with dialysis.  Mild hyperglycemia.  Patient is diabetic.  Has no thrill in her recent AV graft.  Discussed with vascular surgery and they Quillian Quince continue with plan of just outpatient follow-up.  Also has dizziness/vertigo.  States she feels as if she is drunk and she  has not drunk.  Worse with lying down.  With age and comorbidities I feels patient benefit from a more extensive work-up for central cause of this.  We will get MRI.  If negative can likely discharge home with symptomatic treatment with outpatient follow-up with vascular surgery and potentially ENT.  Care turned over to Dr. Tyrone Nine Final Clinical Impression(s) / ED Diagnoses Final diagnoses:  End stage renal disease on dialysis [facility])  Occlusion of arteriovenous dialysis graft [facility])  Vertigo    Rx / DC Orders ED Discharge Orders    None       Davonna Belling, MD 05/25/21 0005

## 2021-05-24 NOTE — Progress Notes (Addendum)
Left Upper arm AV Graft duplex  has been completed. Refer to [facility] under chart review to view preliminary results. Results given to Dr. Boyd Kerbs.  05/24/2021  6:52 PM

## 2021-05-25 ENCOUNTER — Emergency Department (HOSPITAL_COMMUNITY): Payer: Medicare Other

## 2021-05-25 DIAGNOSIS — R531 Weakness: Secondary | ICD-10-CM | POA: Diagnosis not present

## 2021-05-25 DIAGNOSIS — R42 Dizziness and giddiness: Secondary | ICD-10-CM | POA: Diagnosis not present

## 2021-05-25 MED ORDER — MECLIZINE HCL 25 MG PO TABS: 25.0000 mg | ORAL_TABLET | Freq: Three times a day (TID) | ORAL | 0 refills | Status: DC | PRN

## 2021-05-25 NOTE — ED Provider Notes (Signed)
See the patient in signout from Dr. Alvino Chapel briefly 80 year old female with dizziness.  Plan for MRI if negative discharge home.  MRI was resulted and negative for acute stroke.  Will discharge home with meclizine prescription.   Deno Etienne, DO 05/25/21 [phone]

## 2021-05-25 NOTE — ED Notes (Signed)
Pt transferred to MRI

## 2021-05-25 NOTE — Discharge Instructions (Signed)
Your MRI did not show a stroke.  Follow up with your family doc.  Return for worsening symptoms, inability to walk one-sided numbness or weakness difficulty with speech or swallowing.

## 2021-05-26 DIAGNOSIS — N186 End stage renal disease: Secondary | ICD-10-CM | POA: Diagnosis not present

## 2021-05-26 DIAGNOSIS — D688 Other specified coagulation defects: Secondary | ICD-10-CM | POA: Diagnosis not present

## 2021-05-26 DIAGNOSIS — Z992 Dependence on renal dialysis: Secondary | ICD-10-CM | POA: Diagnosis not present

## 2021-05-26 DIAGNOSIS — N2581 Secondary hyperparathyroidism of renal origin: Secondary | ICD-10-CM | POA: Diagnosis not present

## 2021-05-26 DIAGNOSIS — T8249XA Other complication of vascular dialysis catheter, initial encounter: Secondary | ICD-10-CM | POA: Diagnosis not present

## 2021-05-27 ENCOUNTER — Ambulatory Visit (INDEPENDENT_AMBULATORY_CARE_PROVIDER_SITE_OTHER): Payer: Medicare Other | Admitting: Vascular Surgery

## 2021-05-27 ENCOUNTER — Other Ambulatory Visit: Payer: Self-pay

## 2021-05-27 ENCOUNTER — Encounter: Payer: Self-pay | Admitting: Vascular Surgery

## 2021-05-27 VITALS — BP 127/79 | HR 88 | Temp 98.2°F | Resp 20 | Ht 60.0 in | Wt 196.0 lb

## 2021-05-27 DIAGNOSIS — N186 End stage renal disease: Secondary | ICD-10-CM

## 2021-05-27 NOTE — Progress Notes (Signed)
Patient ID: Katie Garcia, female   DOB: 1941-11-11, 80 y.o.   MRN: [id number]  Reason for Consult: Routine Post Op   Referred by [name],*  Subjective:     HPI:  Katie Garcia is a 80 y.o. female history of bilateral upper extremity access.  She had thrombosed right upper extremity fistula she underwent central venography which demonstrated patency bilaterally.  We plan for left upper extremity AV graft which she has now undergone.  She does have some numbness of her distal fingers on the left.  Wounds are healing well she does have significant bruising.  She is dialyzing via IJ catheter which is working well although she has having complications of hypotension.  She is on dialysis Tuesdays, Thursdays and Saturdays on [address].  She does not take blood thinners.  Past Medical History:  Diagnosis Date  . Anemia   . Arthritis   . Chronic kidney disease    Dialysis T/Th/Sa at [place]  . Diabetes mellitus without complication ([place])   . GERD (gastroesophageal reflux disease)   . Gout   . Headache   . Hypertension   . Type 2 diabetes mellitus (HCC)    Family History  Problem Relation Age of Onset  . Diabetes Mother    Past Surgical History:  Procedure Laterality Date  . ABDOMINAL HYSTERECTOMY    . AV FISTULA PLACEMENT Left 10/02/2016   Procedure: ARTERIOVENOUS (AV) FISTULA CREATION LEFT UPPER ARM;  Surgeon: Waynetta Sandy, MD;  Location: [place];  Service: Vascular;  Laterality: Left;  . AV FISTULA PLACEMENT Right 07/14/2020   Procedure: RIGHT ARM ARTERIOVENOUS FISTULA CREATION;  Surgeon: Waynetta Sandy, MD;  Location: [place];  Service: Vascular;  Laterality: Right;  . AV FISTULA PLACEMENT Left 05/16/2021   Procedure: INSERTION OF LEFT UPPER ARM ARTERIOVENOUS (AV) GORE-TEX GRAFT;  Surgeon: Marty Heck, MD;  Location: [place];  Service: Vascular;  Laterality: Left;  . BASCILIC VEIN TRANSPOSITION Left 05/17/2020   Procedure: Insertion of  Left arm arteriovenous gortex graftarm ;  Surgeon: Angelia Mould, MD;  Location: [facility] OR;  Service: Vascular;  Laterality: Left;  . CESAREAN SECTION    . DIALYSIS/PERMA CATHETER INSERTION    . REVISON OF ARTERIOVENOUS FISTULA Right 09/15/2020   Procedure: REVISON OF RIGHT UPPER ARM  ARTERIOVENOUS FISTULA;  Surgeon: Waynetta Sandy, MD;  Location: [place];  Service: Vascular;  Laterality: Right;  . UPPER EXTREMITY VENOGRAPHY Bilateral 04/18/2021   Procedure: UPPER EXTREMITY VENOGRAPHY;  Surgeon: Waynetta Sandy, MD;  Location: [place] [facility];  Service: Cardiovascular;  Laterality: Bilateral;    Short Social History:  Social History   Tobacco Use  . Smoking status: Never Smoker  . Smokeless tobacco: Never Used  Substance Use Topics  . Alcohol use: Not Currently    Allergies  Allergen Reactions  . Penicillins Rash    Did it involve swelling of the face/tongue/throat, SOB, or low BP? No Did it involve sudden or severe rash/hives, skin peeling, or any reaction on the inside of your mouth or nose? Yes Did you need to seek medical attention at a hospital or doctor's office? N/A When did it last happen?N/A If all above answers are "NO", may proceed with cephalosporin use.    Current Outpatient Medications  Medication Sig Dispense Refill  . Accu-Chek FastClix Lancets MISC     . ACCU-CHEK GUIDE test strip 1 each by Other route in the morning and at bedtime. E11.22  1  .  acetaminophen (TYLENOL) 500 MG tablet Take 500-1,000 mg by mouth every 6 (six) hours as needed for moderate pain or headache.    . allopurinol (ZYLOPRIM) 100 MG tablet Take 100 mg by mouth at bedtime as needed (Flair up). After dialysis    . B Complex-C-Zn-Folic Acid (DIALYVITE/ZINC) TABS Take 1 tablet by mouth daily with supper.  3  . Blood Glucose Monitoring Suppl (ACCU-CHEK GUIDE) w/Device KIT     . calcium acetate (PHOSLO) 667 MG capsule Take 2,001 mg by mouth with breakfast, with lunch,  and with evening meal. Take 2 capsules (2001 mg) by mouth twice daily with meals on dialysis days (Tuesdays, Thursdays & Saturdays) Take 2 capsules (2058m) by mouth 3 times daily with meals on non-dialysis days (Sundays, Mondays, Wednesdays & Fridays)  3  . Calcium Carbonate Antacid (TUMS E-X PO) Take 2 tablets by mouth daily as needed (acid reflux).     . carvedilol (COREG) 25 MG tablet Take 25 mg by mouth in the morning and at bedtime.     . famotidine (PEPCID) 20 MG tablet Take 20 mg by mouth at bedtime.    .Marland KitchenguaiFENesin (MUCINEX) 600 MG 12 hr tablet Take 600 mg by mouth daily as needed for to loosen phlegm.    .Marland Kitchenguaifenesin (ROBITUSSIN) 100 MG/5ML syrup Take 100 mg by mouth 3 (three) times daily as needed for cough.    .Marland KitchenHYDROcodone-acetaminophen (NORCO/VICODIN) 5-325 MG tablet Take 1 tablet by mouth every 6 (six) hours as needed for moderate pain. 20 tablet 0  . insulin NPH-regular Human (70-30) 100 UNIT/ML injection Inject 30 Units into the skin daily with breakfast. 30 mL 3  . iron sucrose in sodium chloride 0.9 % 100 mL Iron Sucrose (Venofer)    . loratadine (CLARITIN) 10 MG tablet Take 10 mg by mouth in the morning.    .Marland Kitchenlosartan (COZAAR) 25 MG tablet Take 25 mg by mouth at bedtime.    . meclizine (ANTIVERT) 25 MG tablet Take 1 tablet (25 mg total) by mouth 3 (three) times daily as needed for dizziness. 30 tablet 0  . Methoxy PEG-Epoetin Beta (MIRCERA IJ) Mircera    . Nutritional Supplements (FEEDING SUPPLEMENT, NEPRO CARB STEADY,) LIQD Take 237 mLs by mouth every Tuesday, Thursday, and Saturday at 6 PM.    . RELION INSULIN SYR 0.5ML/31G 31G X 5/16" 0.5 ML MISC USE 1 ONCE DAILY    . rosuvastatin (CRESTOR) 10 MG tablet Take 10 mg by mouth daily.    .Marland Kitchentriamcinolone cream (KENALOG) 0.1 % Apply 1 application topically daily as needed (skin irritation/rash).     Current Facility-Administered Medications  Medication Dose Route Frequency Provider Last Rate Last Admin  . 0.9 %  sodium chloride  infusion  250 mL Intravenous PRN CWaynetta Sandy MD      . sodium chloride flush (NS) 0.9 % injection 3 mL  3 mL Intravenous Q12H CWaynetta Sandy MD      . sodium chloride flush (NS) 0.9 % injection 3 mL  3 mL Intravenous PRN CWaynetta Sandy MD        Review of Systems  Constitutional:  Constitutional negative. HENT: HENT negative.  Eyes: Eyes negative.  Respiratory: Respiratory negative.  Cardiovascular: Cardiovascular negative.  GI: Gastrointestinal negative.  Musculoskeletal: Musculoskeletal negative.  Skin: Skin negative.  Neurological: Positive for numbness.  Hematologic: Positive for bruises/bleeds easily.  Psychiatric: Psychiatric negative.        Objective:  Objective   Vitals:   05/27/21 1336  BP: 127/79  Pulse: 88  Resp: 20  Temp: 98.2 F (36.8 C)  SpO2: 96%  Weight: 196 lb (88.9 kg)  Height: 5' (1.524 m)   Body mass index is 38.28 kg/m.  Physical Exam HENT:     Head: Normocephalic.     Nose:     Comments: Wearing a mask Eyes:     Pupils: Pupils are equal, round, and reactive to light.  Cardiovascular:     Pulses:          Radial pulses are 1+ on the right side and 1+ on the left side.  Pulmonary:     Effort: Pulmonary effort is normal.  Abdominal:     General: Abdomen is flat.  Musculoskeletal:     Comments: No flow identifiable and previously placed left upper arm AV graft  Skin:    Capillary Refill: Capillary refill takes less than 2 seconds.     Comments: Hematoma left upper extremity overlying palpable graft  Neurological:     Mental Status: She is alert.  Psychiatric:        Mood and Affect: Mood normal.        Behavior: Behavior normal.        Thought Content: Thought content normal.     Data: AVG         PSV (cm/s)Flow Vol (mL/min)Describe  +--------------------+----------+-----------------+--------+  Arterial anastomosis              occluded   +--------------------+----------+-----------------+--------+  Prox graft                   occluded  +--------------------+----------+-----------------+--------+  Mid graft                    occluded  +--------------------+----------+-----------------+--------+  Distal graft                  occluded  +--------------------+----------+-----------------+--------+  Venous anastomosis               occluded  +--------------------+----------+-----------------+--------+      Summary:  Occluded left upper arm AV Graft.        Assessment/Plan:     80 year old female with previous bilateral upper extremity access procedures most recently we placed a left arm AV graft which subsequently thrombosed.  She had had central venography prior which demonstrated patency bilaterally.  She does have possible suitable basilic vein for fistula creation.  We will plan for right upper extremity AV fistula versus graft on a nondialysis day in the near future.  She will continue to use her catheter until that time.  I expect that the numbness in her fingers will continue to improve and that the bruising will resolve in her upper arm.     Waynetta Sandy MD [facility]

## 2021-05-28 DIAGNOSIS — T8249XA Other complication of vascular dialysis catheter, initial encounter: Secondary | ICD-10-CM | POA: Diagnosis not present

## 2021-05-28 DIAGNOSIS — D688 Other specified coagulation defects: Secondary | ICD-10-CM | POA: Diagnosis not present

## 2021-05-28 DIAGNOSIS — Z992 Dependence on renal dialysis: Secondary | ICD-10-CM | POA: Diagnosis not present

## 2021-05-28 DIAGNOSIS — N2581 Secondary hyperparathyroidism of renal origin: Secondary | ICD-10-CM | POA: Diagnosis not present

## 2021-05-28 DIAGNOSIS — N186 End stage renal disease: Secondary | ICD-10-CM | POA: Diagnosis not present

## 2021-05-31 DIAGNOSIS — D688 Other specified coagulation defects: Secondary | ICD-10-CM | POA: Diagnosis not present

## 2021-05-31 DIAGNOSIS — T8249XA Other complication of vascular dialysis catheter, initial encounter: Secondary | ICD-10-CM | POA: Diagnosis not present

## 2021-05-31 DIAGNOSIS — N2581 Secondary hyperparathyroidism of renal origin: Secondary | ICD-10-CM | POA: Diagnosis not present

## 2021-05-31 DIAGNOSIS — N186 End stage renal disease: Secondary | ICD-10-CM | POA: Diagnosis not present

## 2021-05-31 DIAGNOSIS — R519 Headache, unspecified: Secondary | ICD-10-CM | POA: Diagnosis not present

## 2021-05-31 DIAGNOSIS — Z992 Dependence on renal dialysis: Secondary | ICD-10-CM | POA: Diagnosis not present

## 2021-06-01 ENCOUNTER — Ambulatory Visit: Payer: Medicare Other | Admitting: Podiatry

## 2021-06-01 ENCOUNTER — Other Ambulatory Visit: Payer: Self-pay

## 2021-06-01 DIAGNOSIS — M79674 Pain in right toe(s): Secondary | ICD-10-CM | POA: Diagnosis not present

## 2021-06-01 DIAGNOSIS — E0843 Diabetes mellitus due to underlying condition with diabetic autonomic (poly)neuropathy: Secondary | ICD-10-CM

## 2021-06-01 DIAGNOSIS — Z0000 Encounter for general adult medical examination without abnormal findings: Secondary | ICD-10-CM | POA: Diagnosis not present

## 2021-06-01 DIAGNOSIS — J45998 Other asthma: Secondary | ICD-10-CM | POA: Diagnosis not present

## 2021-06-01 DIAGNOSIS — K219 Gastro-esophageal reflux disease without esophagitis: Secondary | ICD-10-CM | POA: Diagnosis not present

## 2021-06-01 DIAGNOSIS — E1122 Type 2 diabetes mellitus with diabetic chronic kidney disease: Secondary | ICD-10-CM | POA: Diagnosis not present

## 2021-06-01 DIAGNOSIS — N186 End stage renal disease: Secondary | ICD-10-CM | POA: Diagnosis not present

## 2021-06-01 DIAGNOSIS — B351 Tinea unguium: Secondary | ICD-10-CM

## 2021-06-01 DIAGNOSIS — I770 Arteriovenous fistula, acquired: Secondary | ICD-10-CM | POA: Diagnosis not present

## 2021-06-01 DIAGNOSIS — M79675 Pain in left toe(s): Secondary | ICD-10-CM | POA: Diagnosis not present

## 2021-06-01 DIAGNOSIS — Z1389 Encounter for screening for other disorder: Secondary | ICD-10-CM | POA: Diagnosis not present

## 2021-06-01 DIAGNOSIS — I10 Essential (primary) hypertension: Secondary | ICD-10-CM | POA: Diagnosis not present

## 2021-06-02 DIAGNOSIS — Z992 Dependence on renal dialysis: Secondary | ICD-10-CM | POA: Diagnosis not present

## 2021-06-02 DIAGNOSIS — N186 End stage renal disease: Secondary | ICD-10-CM | POA: Diagnosis not present

## 2021-06-02 DIAGNOSIS — R519 Headache, unspecified: Secondary | ICD-10-CM | POA: Diagnosis not present

## 2021-06-02 DIAGNOSIS — N2581 Secondary hyperparathyroidism of renal origin: Secondary | ICD-10-CM | POA: Diagnosis not present

## 2021-06-02 DIAGNOSIS — T8249XA Other complication of vascular dialysis catheter, initial encounter: Secondary | ICD-10-CM | POA: Diagnosis not present

## 2021-06-02 DIAGNOSIS — D688 Other specified coagulation defects: Secondary | ICD-10-CM | POA: Diagnosis not present

## 2021-06-04 DIAGNOSIS — N186 End stage renal disease: Secondary | ICD-10-CM | POA: Diagnosis not present

## 2021-06-04 DIAGNOSIS — N2581 Secondary hyperparathyroidism of renal origin: Secondary | ICD-10-CM | POA: Diagnosis not present

## 2021-06-04 DIAGNOSIS — T8249XA Other complication of vascular dialysis catheter, initial encounter: Secondary | ICD-10-CM | POA: Diagnosis not present

## 2021-06-04 DIAGNOSIS — D688 Other specified coagulation defects: Secondary | ICD-10-CM | POA: Diagnosis not present

## 2021-06-04 DIAGNOSIS — Z992 Dependence on renal dialysis: Secondary | ICD-10-CM | POA: Diagnosis not present

## 2021-06-04 DIAGNOSIS — R519 Headache, unspecified: Secondary | ICD-10-CM | POA: Diagnosis not present

## 2021-06-07 DIAGNOSIS — Z992 Dependence on renal dialysis: Secondary | ICD-10-CM | POA: Diagnosis not present

## 2021-06-07 DIAGNOSIS — D631 Anemia in chronic kidney disease: Secondary | ICD-10-CM | POA: Diagnosis not present

## 2021-06-07 DIAGNOSIS — N2581 Secondary hyperparathyroidism of renal origin: Secondary | ICD-10-CM | POA: Diagnosis not present

## 2021-06-07 DIAGNOSIS — N186 End stage renal disease: Secondary | ICD-10-CM | POA: Diagnosis not present

## 2021-06-07 DIAGNOSIS — E1129 Type 2 diabetes mellitus with other diabetic kidney complication: Secondary | ICD-10-CM | POA: Diagnosis not present

## 2021-06-07 DIAGNOSIS — T8249XA Other complication of vascular dialysis catheter, initial encounter: Secondary | ICD-10-CM | POA: Diagnosis not present

## 2021-06-07 DIAGNOSIS — D688 Other specified coagulation defects: Secondary | ICD-10-CM | POA: Diagnosis not present

## 2021-06-07 DIAGNOSIS — R519 Headache, unspecified: Secondary | ICD-10-CM | POA: Diagnosis not present

## 2021-06-07 NOTE — Progress Notes (Signed)
   SUBJECTIVE Patient with a history of diabetes mellitus presents to office today complaining of elongated, thickened nails that cause pain while ambulating in shoes.  She is unable to trim her own nails. Patient is here for further evaluation and treatment.   Past Medical History:  Diagnosis Date   Anemia    Arthritis    Chronic kidney disease    Dialysis T/Th/Sa at [address]   Diabetes mellitus without complication ([place])    GERD (gastroesophageal reflux disease)    Gout    Headache    Hypertension    Type 2 diabetes mellitus ([place])     OBJECTIVE General Patient is awake, alert, and oriented x 3 and in no acute distress. Derm Skin is dry and supple bilateral. Negative open lesions or macerations. Remaining integument unremarkable. Nails are tender, long, thickened and dystrophic with subungual debris, consistent with onychomycosis, 1-5 bilateral. No signs of infection noted. Vasc  DP and PT pedal pulses palpable bilaterally. Temperature gradient within normal limits.  Neuro Epicritic and protective threshold sensation diminished bilaterally.  Musculoskeletal Exam No symptomatic pedal deformities noted bilateral. Muscular strength within normal limits.  ASSESSMENT 1. Diabetes Mellitus w/ peripheral neuropathy 2. Onychomycosis of nail due to dermatophyte bilateral 3. Pain in foot bilateral  PLAN OF CARE 1. Patient evaluated today. 2. Instructed to maintain good pedal hygiene and foot care. Stressed importance of controlling blood sugar.  3. Mechanical debridement of nails 1-5 bilaterally performed using a nail nipper. Filed with dremel without incident.  4. Return to clinic in 3 mos.     Edrick Kins, DPM [facility]  Dr. Edrick Kins, DPM    [address], [place] [address]                Office [phone]  Fax [phone]

## 2021-06-09 DIAGNOSIS — E1122 Type 2 diabetes mellitus with diabetic chronic kidney disease: Secondary | ICD-10-CM | POA: Diagnosis not present

## 2021-06-09 DIAGNOSIS — D631 Anemia in chronic kidney disease: Secondary | ICD-10-CM | POA: Diagnosis not present

## 2021-06-09 DIAGNOSIS — N2581 Secondary hyperparathyroidism of renal origin: Secondary | ICD-10-CM | POA: Diagnosis not present

## 2021-06-09 DIAGNOSIS — Z992 Dependence on renal dialysis: Secondary | ICD-10-CM | POA: Diagnosis not present

## 2021-06-09 DIAGNOSIS — T8249XA Other complication of vascular dialysis catheter, initial encounter: Secondary | ICD-10-CM | POA: Diagnosis not present

## 2021-06-09 DIAGNOSIS — N186 End stage renal disease: Secondary | ICD-10-CM | POA: Diagnosis not present

## 2021-06-09 DIAGNOSIS — D688 Other specified coagulation defects: Secondary | ICD-10-CM | POA: Diagnosis not present

## 2021-06-09 DIAGNOSIS — R519 Headache, unspecified: Secondary | ICD-10-CM | POA: Diagnosis not present

## 2021-06-09 DIAGNOSIS — E1129 Type 2 diabetes mellitus with other diabetic kidney complication: Secondary | ICD-10-CM | POA: Diagnosis not present

## 2021-06-09 DIAGNOSIS — I10 Essential (primary) hypertension: Secondary | ICD-10-CM | POA: Diagnosis not present

## 2021-06-10 DIAGNOSIS — K219 Gastro-esophageal reflux disease without esophagitis: Secondary | ICD-10-CM | POA: Diagnosis not present

## 2021-06-10 DIAGNOSIS — N186 End stage renal disease: Secondary | ICD-10-CM | POA: Diagnosis not present

## 2021-06-10 DIAGNOSIS — N184 Chronic kidney disease, stage 4 (severe): Secondary | ICD-10-CM | POA: Diagnosis not present

## 2021-06-10 DIAGNOSIS — J45998 Other asthma: Secondary | ICD-10-CM | POA: Diagnosis not present

## 2021-06-10 DIAGNOSIS — N185 Chronic kidney disease, stage 5: Secondary | ICD-10-CM | POA: Diagnosis not present

## 2021-06-10 DIAGNOSIS — I10 Essential (primary) hypertension: Secondary | ICD-10-CM | POA: Diagnosis not present

## 2021-06-10 DIAGNOSIS — E785 Hyperlipidemia, unspecified: Secondary | ICD-10-CM | POA: Diagnosis not present

## 2021-06-10 DIAGNOSIS — I129 Hypertensive chronic kidney disease with stage 1 through stage 4 chronic kidney disease, or unspecified chronic kidney disease: Secondary | ICD-10-CM | POA: Diagnosis not present

## 2021-06-10 DIAGNOSIS — E1122 Type 2 diabetes mellitus with diabetic chronic kidney disease: Secondary | ICD-10-CM | POA: Diagnosis not present

## 2021-06-10 DIAGNOSIS — N2581 Secondary hyperparathyroidism of renal origin: Secondary | ICD-10-CM | POA: Diagnosis not present

## 2021-06-11 DIAGNOSIS — D631 Anemia in chronic kidney disease: Secondary | ICD-10-CM | POA: Diagnosis not present

## 2021-06-11 DIAGNOSIS — N186 End stage renal disease: Secondary | ICD-10-CM | POA: Diagnosis not present

## 2021-06-11 DIAGNOSIS — D688 Other specified coagulation defects: Secondary | ICD-10-CM | POA: Diagnosis not present

## 2021-06-11 DIAGNOSIS — N2581 Secondary hyperparathyroidism of renal origin: Secondary | ICD-10-CM | POA: Diagnosis not present

## 2021-06-11 DIAGNOSIS — E1129 Type 2 diabetes mellitus with other diabetic kidney complication: Secondary | ICD-10-CM | POA: Diagnosis not present

## 2021-06-11 DIAGNOSIS — T8249XA Other complication of vascular dialysis catheter, initial encounter: Secondary | ICD-10-CM | POA: Diagnosis not present

## 2021-06-11 DIAGNOSIS — R519 Headache, unspecified: Secondary | ICD-10-CM | POA: Diagnosis not present

## 2021-06-11 DIAGNOSIS — Z992 Dependence on renal dialysis: Secondary | ICD-10-CM | POA: Diagnosis not present

## 2021-06-14 DIAGNOSIS — N186 End stage renal disease: Secondary | ICD-10-CM | POA: Diagnosis not present

## 2021-06-14 DIAGNOSIS — Z992 Dependence on renal dialysis: Secondary | ICD-10-CM | POA: Diagnosis not present

## 2021-06-14 DIAGNOSIS — T8249XA Other complication of vascular dialysis catheter, initial encounter: Secondary | ICD-10-CM | POA: Diagnosis not present

## 2021-06-14 DIAGNOSIS — D688 Other specified coagulation defects: Secondary | ICD-10-CM | POA: Diagnosis not present

## 2021-06-14 DIAGNOSIS — R519 Headache, unspecified: Secondary | ICD-10-CM | POA: Diagnosis not present

## 2021-06-14 DIAGNOSIS — N2581 Secondary hyperparathyroidism of renal origin: Secondary | ICD-10-CM | POA: Diagnosis not present

## 2021-06-16 DIAGNOSIS — N2581 Secondary hyperparathyroidism of renal origin: Secondary | ICD-10-CM | POA: Diagnosis not present

## 2021-06-16 DIAGNOSIS — T8249XA Other complication of vascular dialysis catheter, initial encounter: Secondary | ICD-10-CM | POA: Diagnosis not present

## 2021-06-16 DIAGNOSIS — D688 Other specified coagulation defects: Secondary | ICD-10-CM | POA: Diagnosis not present

## 2021-06-16 DIAGNOSIS — N186 End stage renal disease: Secondary | ICD-10-CM | POA: Diagnosis not present

## 2021-06-16 DIAGNOSIS — Z992 Dependence on renal dialysis: Secondary | ICD-10-CM | POA: Diagnosis not present

## 2021-06-16 DIAGNOSIS — R519 Headache, unspecified: Secondary | ICD-10-CM | POA: Diagnosis not present

## 2021-06-18 DIAGNOSIS — N186 End stage renal disease: Secondary | ICD-10-CM | POA: Diagnosis not present

## 2021-06-18 DIAGNOSIS — R519 Headache, unspecified: Secondary | ICD-10-CM | POA: Diagnosis not present

## 2021-06-18 DIAGNOSIS — T8249XA Other complication of vascular dialysis catheter, initial encounter: Secondary | ICD-10-CM | POA: Diagnosis not present

## 2021-06-18 DIAGNOSIS — D688 Other specified coagulation defects: Secondary | ICD-10-CM | POA: Diagnosis not present

## 2021-06-18 DIAGNOSIS — N2581 Secondary hyperparathyroidism of renal origin: Secondary | ICD-10-CM | POA: Diagnosis not present

## 2021-06-18 DIAGNOSIS — Z992 Dependence on renal dialysis: Secondary | ICD-10-CM | POA: Diagnosis not present

## 2021-06-21 DIAGNOSIS — R519 Headache, unspecified: Secondary | ICD-10-CM | POA: Diagnosis not present

## 2021-06-21 DIAGNOSIS — N2581 Secondary hyperparathyroidism of renal origin: Secondary | ICD-10-CM | POA: Diagnosis not present

## 2021-06-21 DIAGNOSIS — T8249XA Other complication of vascular dialysis catheter, initial encounter: Secondary | ICD-10-CM | POA: Diagnosis not present

## 2021-06-21 DIAGNOSIS — D631 Anemia in chronic kidney disease: Secondary | ICD-10-CM | POA: Diagnosis not present

## 2021-06-21 DIAGNOSIS — N186 End stage renal disease: Secondary | ICD-10-CM | POA: Diagnosis not present

## 2021-06-21 DIAGNOSIS — D688 Other specified coagulation defects: Secondary | ICD-10-CM | POA: Diagnosis not present

## 2021-06-21 DIAGNOSIS — Z992 Dependence on renal dialysis: Secondary | ICD-10-CM | POA: Diagnosis not present

## 2021-06-23 DIAGNOSIS — Z992 Dependence on renal dialysis: Secondary | ICD-10-CM | POA: Diagnosis not present

## 2021-06-23 DIAGNOSIS — R519 Headache, unspecified: Secondary | ICD-10-CM | POA: Diagnosis not present

## 2021-06-23 DIAGNOSIS — E1122 Type 2 diabetes mellitus with diabetic chronic kidney disease: Secondary | ICD-10-CM | POA: Diagnosis not present

## 2021-06-23 DIAGNOSIS — T8249XA Other complication of vascular dialysis catheter, initial encounter: Secondary | ICD-10-CM | POA: Diagnosis not present

## 2021-06-23 DIAGNOSIS — D688 Other specified coagulation defects: Secondary | ICD-10-CM | POA: Diagnosis not present

## 2021-06-23 DIAGNOSIS — Z23 Encounter for immunization: Secondary | ICD-10-CM | POA: Diagnosis not present

## 2021-06-23 DIAGNOSIS — D631 Anemia in chronic kidney disease: Secondary | ICD-10-CM | POA: Diagnosis not present

## 2021-06-23 DIAGNOSIS — N186 End stage renal disease: Secondary | ICD-10-CM | POA: Diagnosis not present

## 2021-06-23 DIAGNOSIS — N2581 Secondary hyperparathyroidism of renal origin: Secondary | ICD-10-CM | POA: Diagnosis not present

## 2021-06-25 DIAGNOSIS — Z992 Dependence on renal dialysis: Secondary | ICD-10-CM | POA: Diagnosis not present

## 2021-06-25 DIAGNOSIS — D631 Anemia in chronic kidney disease: Secondary | ICD-10-CM | POA: Diagnosis not present

## 2021-06-25 DIAGNOSIS — N186 End stage renal disease: Secondary | ICD-10-CM | POA: Diagnosis not present

## 2021-06-25 DIAGNOSIS — E1129 Type 2 diabetes mellitus with other diabetic kidney complication: Secondary | ICD-10-CM | POA: Diagnosis not present

## 2021-06-25 DIAGNOSIS — T8249XA Other complication of vascular dialysis catheter, initial encounter: Secondary | ICD-10-CM | POA: Diagnosis not present

## 2021-06-25 DIAGNOSIS — N2581 Secondary hyperparathyroidism of renal origin: Secondary | ICD-10-CM | POA: Diagnosis not present

## 2021-06-25 DIAGNOSIS — D688 Other specified coagulation defects: Secondary | ICD-10-CM | POA: Diagnosis not present

## 2021-06-25 DIAGNOSIS — R519 Headache, unspecified: Secondary | ICD-10-CM | POA: Diagnosis not present

## 2021-06-28 DIAGNOSIS — D688 Other specified coagulation defects: Secondary | ICD-10-CM | POA: Diagnosis not present

## 2021-06-28 DIAGNOSIS — T8249XA Other complication of vascular dialysis catheter, initial encounter: Secondary | ICD-10-CM | POA: Diagnosis not present

## 2021-06-28 DIAGNOSIS — N2581 Secondary hyperparathyroidism of renal origin: Secondary | ICD-10-CM | POA: Diagnosis not present

## 2021-06-28 DIAGNOSIS — E1129 Type 2 diabetes mellitus with other diabetic kidney complication: Secondary | ICD-10-CM | POA: Diagnosis not present

## 2021-06-28 DIAGNOSIS — D631 Anemia in chronic kidney disease: Secondary | ICD-10-CM | POA: Diagnosis not present

## 2021-06-28 DIAGNOSIS — R519 Headache, unspecified: Secondary | ICD-10-CM | POA: Diagnosis not present

## 2021-06-28 DIAGNOSIS — N186 End stage renal disease: Secondary | ICD-10-CM | POA: Diagnosis not present

## 2021-06-28 DIAGNOSIS — Z992 Dependence on renal dialysis: Secondary | ICD-10-CM | POA: Diagnosis not present

## 2021-06-30 DIAGNOSIS — T8249XA Other complication of vascular dialysis catheter, initial encounter: Secondary | ICD-10-CM | POA: Diagnosis not present

## 2021-06-30 DIAGNOSIS — Z992 Dependence on renal dialysis: Secondary | ICD-10-CM | POA: Diagnosis not present

## 2021-06-30 DIAGNOSIS — R519 Headache, unspecified: Secondary | ICD-10-CM | POA: Diagnosis not present

## 2021-06-30 DIAGNOSIS — D688 Other specified coagulation defects: Secondary | ICD-10-CM | POA: Diagnosis not present

## 2021-06-30 DIAGNOSIS — N186 End stage renal disease: Secondary | ICD-10-CM | POA: Diagnosis not present

## 2021-06-30 DIAGNOSIS — D631 Anemia in chronic kidney disease: Secondary | ICD-10-CM | POA: Diagnosis not present

## 2021-06-30 DIAGNOSIS — N2581 Secondary hyperparathyroidism of renal origin: Secondary | ICD-10-CM | POA: Diagnosis not present

## 2021-06-30 DIAGNOSIS — E1129 Type 2 diabetes mellitus with other diabetic kidney complication: Secondary | ICD-10-CM | POA: Diagnosis not present

## 2021-07-02 DIAGNOSIS — T8249XA Other complication of vascular dialysis catheter, initial encounter: Secondary | ICD-10-CM | POA: Diagnosis not present

## 2021-07-02 DIAGNOSIS — D688 Other specified coagulation defects: Secondary | ICD-10-CM | POA: Diagnosis not present

## 2021-07-02 DIAGNOSIS — R519 Headache, unspecified: Secondary | ICD-10-CM | POA: Diagnosis not present

## 2021-07-02 DIAGNOSIS — N186 End stage renal disease: Secondary | ICD-10-CM | POA: Diagnosis not present

## 2021-07-02 DIAGNOSIS — D631 Anemia in chronic kidney disease: Secondary | ICD-10-CM | POA: Diagnosis not present

## 2021-07-02 DIAGNOSIS — Z992 Dependence on renal dialysis: Secondary | ICD-10-CM | POA: Diagnosis not present

## 2021-07-02 DIAGNOSIS — E1129 Type 2 diabetes mellitus with other diabetic kidney complication: Secondary | ICD-10-CM | POA: Diagnosis not present

## 2021-07-02 DIAGNOSIS — N2581 Secondary hyperparathyroidism of renal origin: Secondary | ICD-10-CM | POA: Diagnosis not present

## 2021-07-05 DIAGNOSIS — R519 Headache, unspecified: Secondary | ICD-10-CM | POA: Diagnosis not present

## 2021-07-05 DIAGNOSIS — N2581 Secondary hyperparathyroidism of renal origin: Secondary | ICD-10-CM | POA: Diagnosis not present

## 2021-07-05 DIAGNOSIS — N186 End stage renal disease: Secondary | ICD-10-CM | POA: Diagnosis not present

## 2021-07-05 DIAGNOSIS — E1129 Type 2 diabetes mellitus with other diabetic kidney complication: Secondary | ICD-10-CM | POA: Diagnosis not present

## 2021-07-05 DIAGNOSIS — T8249XA Other complication of vascular dialysis catheter, initial encounter: Secondary | ICD-10-CM | POA: Diagnosis not present

## 2021-07-05 DIAGNOSIS — D631 Anemia in chronic kidney disease: Secondary | ICD-10-CM | POA: Diagnosis not present

## 2021-07-05 DIAGNOSIS — D688 Other specified coagulation defects: Secondary | ICD-10-CM | POA: Diagnosis not present

## 2021-07-05 DIAGNOSIS — Z992 Dependence on renal dialysis: Secondary | ICD-10-CM | POA: Diagnosis not present

## 2021-07-07 DIAGNOSIS — Z992 Dependence on renal dialysis: Secondary | ICD-10-CM | POA: Diagnosis not present

## 2021-07-07 DIAGNOSIS — E1129 Type 2 diabetes mellitus with other diabetic kidney complication: Secondary | ICD-10-CM | POA: Diagnosis not present

## 2021-07-07 DIAGNOSIS — N2581 Secondary hyperparathyroidism of renal origin: Secondary | ICD-10-CM | POA: Diagnosis not present

## 2021-07-07 DIAGNOSIS — N186 End stage renal disease: Secondary | ICD-10-CM | POA: Diagnosis not present

## 2021-07-07 DIAGNOSIS — D631 Anemia in chronic kidney disease: Secondary | ICD-10-CM | POA: Diagnosis not present

## 2021-07-07 DIAGNOSIS — D688 Other specified coagulation defects: Secondary | ICD-10-CM | POA: Diagnosis not present

## 2021-07-07 DIAGNOSIS — T8249XA Other complication of vascular dialysis catheter, initial encounter: Secondary | ICD-10-CM | POA: Diagnosis not present

## 2021-07-07 DIAGNOSIS — R519 Headache, unspecified: Secondary | ICD-10-CM | POA: Diagnosis not present

## 2021-07-08 ENCOUNTER — Encounter: Payer: Self-pay | Admitting: Endocrinology

## 2021-07-08 ENCOUNTER — Ambulatory Visit (INDEPENDENT_AMBULATORY_CARE_PROVIDER_SITE_OTHER): Payer: Medicare Other | Admitting: Endocrinology

## 2021-07-08 ENCOUNTER — Other Ambulatory Visit: Payer: Self-pay

## 2021-07-08 VITALS — BP 132/80 | HR 94 | Ht 60.0 in | Wt 197.6 lb

## 2021-07-08 DIAGNOSIS — E1151 Type 2 diabetes mellitus with diabetic peripheral angiopathy without gangrene: Secondary | ICD-10-CM

## 2021-07-08 DIAGNOSIS — E1122 Type 2 diabetes mellitus with diabetic chronic kidney disease: Secondary | ICD-10-CM | POA: Diagnosis not present

## 2021-07-08 DIAGNOSIS — N2581 Secondary hyperparathyroidism of renal origin: Secondary | ICD-10-CM | POA: Diagnosis not present

## 2021-07-08 DIAGNOSIS — J45901 Unspecified asthma with (acute) exacerbation: Secondary | ICD-10-CM | POA: Diagnosis not present

## 2021-07-08 DIAGNOSIS — N185 Chronic kidney disease, stage 5: Secondary | ICD-10-CM | POA: Diagnosis not present

## 2021-07-08 DIAGNOSIS — I10 Essential (primary) hypertension: Secondary | ICD-10-CM | POA: Diagnosis not present

## 2021-07-08 DIAGNOSIS — K219 Gastro-esophageal reflux disease without esophagitis: Secondary | ICD-10-CM | POA: Diagnosis not present

## 2021-07-08 DIAGNOSIS — N186 End stage renal disease: Secondary | ICD-10-CM | POA: Diagnosis not present

## 2021-07-08 DIAGNOSIS — I129 Hypertensive chronic kidney disease with stage 1 through stage 4 chronic kidney disease, or unspecified chronic kidney disease: Secondary | ICD-10-CM | POA: Diagnosis not present

## 2021-07-08 LAB — POCT GLYCOSYLATED HEMOGLOBIN (HGB A1C): Hemoglobin A1C: 8.3 % — AB (ref 4.0–5.6)

## 2021-07-08 MED ORDER — INSULIN NPH ISOPHANE & REGULAR (70-30) 100 UNIT/ML ~~LOC~~ SUSP: 31.0000 [IU] | Freq: Every day | SUBCUTANEOUS | 3 refills | Status: DC

## 2021-07-08 NOTE — Patient Instructions (Addendum)
check your blood sugar twice a day.  vary the time of day when you check, between before the 3 meals, and at bedtime.  also check if you have symptoms of your blood sugar being too high or too low.  please keep a record of the readings and bring it to your next appointment here (or you can bring the meter itself).  You can write it on any piece of paper.  please call us sooner if your blood sugar goes below 70, or if you have a lot of readings over 200.   Please take 31 units with breakfast on all days.   On this type of insulin schedule, you should eat meals on a regular schedule (especially lunch).  If a meal is missed or significantly delayed, your blood sugar could go low.   Please come back for a follow-up appointment in 3 months.

## 2021-07-08 NOTE — Progress Notes (Signed)
Subjective:    Patient ID: Katie Garcia, female    DOB: 09-22-1941, 80 y.o.   MRN: [id number]  HPI Pt returns for f/u of diabetes mellitus:  DM type: Insulin-requiring type 2 (but vitiligo suggests type 1).   Dx'ed: 5956 Complications: PN, DR, and ESRD (on HD).  Therapy: insulin since 2004.   GDM: 1970 DKA: never.   Severe hypoglycemia: last episode was 2017.    Pancreatitis: never.   Pancreatic imaging: never.  SDOH: she takes human insulin, due to cost.  On HD days (morning), she takes insulin with breakfast when she gets home.   Other: she takes QD insulin, after poor results with multiple daily injections; she was changed to 70/30 qam, due to pattern of cbg's; fructosamine has confirmed A1c.   Interval history: she seldom has hypoglycemia, and these episodes are mild; this happens when a meal is missed; no cbg record, but states cbg varies from 70-400.  It is in general higher as the day goes on.  pt states she feels well in general.  Pt says she never misses the insulin.  No recent steroids.  She takes 30 units with breakfast.   Past Medical History:  Diagnosis Date   Anemia    Arthritis    Chronic kidney disease    Dialysis T/Th/Sa at [address]   Diabetes mellitus without complication ([place])    GERD (gastroesophageal reflux disease)    Gout    Headache    Hypertension    Type 2 diabetes mellitus ([place])     Past Surgical History:  Procedure Laterality Date   ABDOMINAL HYSTERECTOMY     AV FISTULA PLACEMENT Left 10/02/2016   Procedure: ARTERIOVENOUS (AV) FISTULA CREATION LEFT UPPER ARM;  Surgeon: Waynetta Sandy, MD;  Location: [place];  Service: Vascular;  Laterality: Left;   AV FISTULA PLACEMENT Right 07/14/2020   Procedure: RIGHT ARM ARTERIOVENOUS FISTULA CREATION;  Surgeon: Waynetta Sandy, MD;  Location: [place];  Service: Vascular;  Laterality: Right;   AV FISTULA PLACEMENT Left 05/16/2021   Procedure: INSERTION OF LEFT UPPER ARM ARTERIOVENOUS (AV)  GORE-TEX GRAFT;  Surgeon: Marty Heck, MD;  Location: [place];  Service: Vascular;  Laterality: Left;   [place] Left 05/17/2020   Procedure: Insertion of Left arm arteriovenous gortex graftarm ;  Surgeon: Angelia Mould, MD;  Location: [place];  Service: Vascular;  Laterality: Left;   CESAREAN SECTION     DIALYSIS/PERMA CATHETER INSERTION     REVISON OF ARTERIOVENOUS FISTULA Right 09/15/2020   Procedure: REVISON OF RIGHT UPPER ARM  ARTERIOVENOUS FISTULA;  Surgeon: Waynetta Sandy, MD;  Location: [place];  Service: Vascular;  Laterality: Right;   UPPER EXTREMITY VENOGRAPHY Bilateral 04/18/2021   Procedure: UPPER EXTREMITY VENOGRAPHY;  Surgeon: Waynetta Sandy, MD;  Location: [place] [facility];  Service: Cardiovascular;  Laterality: Bilateral;    Social History   Socioeconomic History   Marital status: Married    Spouse name: Not on file   Number of children: Not on file   Years of education: Not on file   Highest education level: Not on file  Occupational History   Not on file  Tobacco Use   Smoking status: Never   Smokeless tobacco: Never  Vaping Use   Vaping Use: Never used  Substance and Sexual Activity   Alcohol use: Not Currently   Drug use: No   Sexual activity: Not on file  Other Topics Concern   Not  on file  Social History Narrative   Not on file   Social Determinants of Health   Financial Resource Strain: Not on file  Food Insecurity: Not on file  Transportation Needs: Not on file  Physical Activity: Not on file  Stress: Not on file  Social Connections: Not on file  Intimate Partner Violence: Not on file    Current Outpatient Medications on File Prior to Visit  Medication Sig Dispense Refill   Accu-Chek FastClix Lancets MISC      ACCU-CHEK GUIDE test strip 1 each by Other route in the morning and at bedtime. E11.22  1   acetaminophen (TYLENOL) 500 MG tablet Take 500-1,000 mg by mouth every 6 (six) hours as  needed for moderate pain or headache.     allopurinol (ZYLOPRIM) 100 MG tablet Take 100 mg by mouth at bedtime as needed (Flair up). After dialysis     B Complex-C-Zn-Folic Acid (DIALYVITE/ZINC) TABS Take 1 tablet by mouth daily with supper.  3   Blood Glucose Monitoring Suppl (ACCU-CHEK GUIDE) w/Device KIT      calcium acetate (PHOSLO) 667 MG capsule Take 2,001 mg by mouth with breakfast, with lunch, and with evening meal. Take 2 capsules (2001 mg) by mouth twice daily with meals on dialysis days (Tuesdays, Thursdays & Saturdays) Take 2 capsules (2019m) by mouth 3 times daily with meals on non-dialysis days (Sundays, Mondays, Wednesdays & Fridays)  3   carvedilol (COREG) 25 MG tablet Take 25 mg by mouth in the morning and at bedtime.      famotidine (PEPCID) 20 MG tablet Take 20 mg by mouth at bedtime.     guaiFENesin (MUCINEX) 600 MG 12 hr tablet Take 600 mg by mouth daily as needed for to loosen phlegm.     guaifenesin (ROBITUSSIN) 100 MG/5ML syrup Take 100 mg by mouth 3 (three) times daily as needed for cough.     HYDROcodone-acetaminophen (NORCO/VICODIN) 5-325 MG tablet Take 1 tablet by mouth every 6 (six) hours as needed for moderate pain. 20 tablet 0   iron sucrose in sodium chloride 0.9 % 100 mL Iron Sucrose (Venofer)     loratadine (CLARITIN) 10 MG tablet Take 10 mg by mouth in the morning.     losartan (COZAAR) 25 MG tablet Take 25 mg by mouth at bedtime.     meclizine (ANTIVERT) 25 MG tablet Take 1 tablet (25 mg total) by mouth 3 (three) times daily as needed for dizziness. 30 tablet 0   Methoxy PEG-Epoetin Beta (MIRCERA IJ) Mircera     Nutritional Supplements (FEEDING SUPPLEMENT, NEPRO CARB STEADY,) LIQD Take 237 mLs by mouth every Tuesday, Thursday, and Saturday at 6 PM.     RELION INSULIN SYR 0.5ML/31G 31G X 5/16" 0.5 ML MISC USE 1 ONCE DAILY     rosuvastatin (CRESTOR) 10 MG tablet Take 10 mg by mouth daily.     triamcinolone cream (KENALOG) 0.1 % Apply 1 application topically daily  as needed (skin irritation/rash).     calcium elemental as carbonate (TUMS ULTRA 1000) 400 MG chewable tablet Chew 1,000 mg by mouth 3 (three) times daily as needed for heartburn (indigestion).     Current Facility-Administered Medications on File Prior to Visit  Medication Dose Route Frequency Provider Last Rate Last Admin   0.9 %  sodium chloride infusion  250 mL Intravenous PRN CWaynetta Sandy MD       sodium chloride flush (NS) 0.9 % injection 3 mL  3 mL Intravenous Q12H CServando Snare  [place] Gave, MD       sodium chloride flush (NS) 0.9 % injection 3 mL  3 mL Intravenous PRN Waynetta Sandy, MD         Family History  Problem Relation Age of Onset   Diabetes Mother     BP 132/80 (BP Location: Right Arm, Patient Position: Sitting, Cuff Size: Large)   Pulse 94   Ht 5' (1.524 m)   Wt 197 lb 9.6 oz (89.6 kg)   SpO2 99%   BMI 38.59 kg/m    Review of Systems     Objective:   Physical Exam Pulses: dorsalis pedis intact bilat.   MSK: no deformity of the feet.  CV: no leg edema.  Skin:  no ulcer on the feet.  normal color and temp on the feet.  Neuro: sensation is intact to touch on the feet, but decreased from normal.    A1c=8.3%     Assessment & Plan:  Insulin-requiring type 2 DM: uncontrolled Hypoglycemia, due to insulin: we'll increase just slightly.    Patient Instructions  check your blood sugar twice a day.  vary the time of day when you check, between before the 3 meals, and at bedtime.  also check if you have symptoms of your blood sugar being too high or too low.  please keep a record of the readings and bring it to your next appointment here (or you can bring the meter itself).  You can write it on any piece of paper.  please call us sooner if your blood sugar goes below 70, or if you have a lot of readings over 200.   Please take 31 units with breakfast on all days.   On this type of insulin schedule, you should eat meals on a regular schedule  (especially lunch).  If a meal is missed or significantly delayed, your blood sugar could go low.   Please come back for a follow-up appointment in 3 months.

## 2021-07-09 DIAGNOSIS — Z992 Dependence on renal dialysis: Secondary | ICD-10-CM | POA: Diagnosis not present

## 2021-07-09 DIAGNOSIS — T8249XA Other complication of vascular dialysis catheter, initial encounter: Secondary | ICD-10-CM | POA: Diagnosis not present

## 2021-07-09 DIAGNOSIS — R519 Headache, unspecified: Secondary | ICD-10-CM | POA: Diagnosis not present

## 2021-07-09 DIAGNOSIS — D688 Other specified coagulation defects: Secondary | ICD-10-CM | POA: Diagnosis not present

## 2021-07-09 DIAGNOSIS — D631 Anemia in chronic kidney disease: Secondary | ICD-10-CM | POA: Diagnosis not present

## 2021-07-09 DIAGNOSIS — N2581 Secondary hyperparathyroidism of renal origin: Secondary | ICD-10-CM | POA: Diagnosis not present

## 2021-07-09 DIAGNOSIS — N186 End stage renal disease: Secondary | ICD-10-CM | POA: Diagnosis not present

## 2021-07-09 DIAGNOSIS — E1129 Type 2 diabetes mellitus with other diabetic kidney complication: Secondary | ICD-10-CM | POA: Diagnosis not present

## 2021-07-12 ENCOUNTER — Encounter (HOSPITAL_COMMUNITY): Payer: Self-pay | Admitting: Vascular Surgery

## 2021-07-12 DIAGNOSIS — E1129 Type 2 diabetes mellitus with other diabetic kidney complication: Secondary | ICD-10-CM | POA: Diagnosis not present

## 2021-07-12 DIAGNOSIS — Z992 Dependence on renal dialysis: Secondary | ICD-10-CM | POA: Diagnosis not present

## 2021-07-12 DIAGNOSIS — N186 End stage renal disease: Secondary | ICD-10-CM | POA: Diagnosis not present

## 2021-07-12 DIAGNOSIS — D688 Other specified coagulation defects: Secondary | ICD-10-CM | POA: Diagnosis not present

## 2021-07-12 DIAGNOSIS — N2581 Secondary hyperparathyroidism of renal origin: Secondary | ICD-10-CM | POA: Diagnosis not present

## 2021-07-12 DIAGNOSIS — R519 Headache, unspecified: Secondary | ICD-10-CM | POA: Diagnosis not present

## 2021-07-12 DIAGNOSIS — T8249XA Other complication of vascular dialysis catheter, initial encounter: Secondary | ICD-10-CM | POA: Diagnosis not present

## 2021-07-12 DIAGNOSIS — D631 Anemia in chronic kidney disease: Secondary | ICD-10-CM | POA: Diagnosis not present

## 2021-07-12 NOTE — Anesthesia Preprocedure Evaluation (Addendum)
Anesthesia Evaluation  Patient identified by MRN, date of birth, ID band Patient awake    Reviewed: Allergy & Precautions, NPO status , Patient's Chart, lab work & pertinent test results  History of Anesthesia Complications Negative for: history of anesthetic complications  Airway Mallampati: I  TM Distance: >3 FB Neck ROM: Full    Dental  (+) Edentulous Upper, Edentulous Lower, Dental Advisory Given   Pulmonary asthma ,    Pulmonary exam normal        Cardiovascular hypertension, Pt. on home beta blockers and Pt. on medications + Peripheral Vascular Disease  Normal cardiovascular exam  03/2018 Echo Impressions:   - Normal LV size and systolic function. EF 55-60%. Moderate  diastolic dysfunction. Normal RV size and systolic function. Mild  pulmonary hypertension.    Neuro/Psych  Headaches, PSYCHIATRIC DISORDERS Depression    GI/Hepatic Neg liver ROS, GERD  Medicated,  Endo/Other  diabetes, Type 2, Insulin Dependent, Oral Hypoglycemic Agents  Renal/GU ESRF and DialysisRenal disease     Musculoskeletal  (+) Arthritis ,   Abdominal   Peds  Hematology negative hematology ROS (+)   Anesthesia Other Findings   Reproductive/Obstetrics                            Anesthesia Physical  Anesthesia Plan  ASA: 3  Anesthesia Plan: MAC   Post-op Pain Management:  Regional for Post-op pain   Induction:   PONV Risk Score and Plan: 2 and Ondansetron, Dexamethasone and Propofol infusion  Airway Management Planned: Natural Airway and Simple Face Mask  Additional Equipment: None  Intra-op Plan:   Post-operative Plan:   Informed Consent: I have reviewed the patients History and Physical, chart, labs and discussed the procedure including the risks, benefits and alternatives for the proposed anesthesia with the patient or authorized representative who has indicated his/her understanding and  acceptance.     Dental advisory given  Plan Discussed with: Anesthesiologist, CRNA and Surgeon  Anesthesia Plan Comments:        Anesthesia Quick Evaluation

## 2021-07-12 NOTE — Progress Notes (Signed)
Ms Katie Garcia denies chest pain or shortness of breath. Patient denies having any s/s of Covid in her household.  Patient denies any known exposure to Covid.   Mrs Katie Garcia has type II diabetes, I instructed patient to not take 70/30 Insulin in am.I instructed patient to check CBG after awaking and every 2 hours until arrival  to the hospital.  I Instructed patient if CBG is less than 70 to take 4 Glucose Tablets or 1 tube of Glucose Gel or 1/2 cup of a clear juice. Recheck CBG in 15 minutes if CBG is not over 70 call, pre- op desk at [phone] for further instructions.   I instructed patient to shower with antibiotic soap, if it is available.  Dry off with a clean towel. Do not put lotion, powder, cologne or deodorant or makeup.No jewelry or piercings.  Woman should not shave. No nail polish, artificial or acrylic nails. Wear clean clothes, brush your teeth. Glasses, contact lens,dentures or partials may not be worn in the OR. If you need to wear them, please bring a case for glasses, do not wear contacts or bring a case, the hospital does not have contact cases, dentures or partials will have to be removed , make sure they are clean, we will provide a denture cup to put them in. You will need some one to derive you home and a responsible person over the age of 51 to stay with you for the first 24 hours after surgery.

## 2021-07-13 ENCOUNTER — Ambulatory Visit (HOSPITAL_COMMUNITY): Payer: Medicare Other | Admitting: Anesthesiology

## 2021-07-13 ENCOUNTER — Ambulatory Visit (HOSPITAL_COMMUNITY)
Admission: RE | Admit: 2021-07-13 | Discharge: 2021-07-13 | Disposition: A | Discharge status: Home / Self Care | Payer: Medicare Other | Attending: Vascular Surgery | Admitting: Vascular Surgery

## 2021-07-13 ENCOUNTER — Telehealth: Payer: Self-pay | Admitting: Endocrinology

## 2021-07-13 ENCOUNTER — Encounter (HOSPITAL_COMMUNITY)
Admission: RE | Disposition: A | Discharge status: Home / Self Care | Payer: Self-pay | Source: Home / Self Care | Attending: Vascular Surgery

## 2021-07-13 DIAGNOSIS — Y832 Surgical operation with anastomosis, bypass or graft as the cause of abnormal reaction of the patient, or of later complication, without mention of misadventure at the time of the procedure: Secondary | ICD-10-CM | POA: Diagnosis not present

## 2021-07-13 DIAGNOSIS — I120 Hypertensive chronic kidney disease with stage 5 chronic kidney disease or end stage renal disease: Secondary | ICD-10-CM | POA: Diagnosis not present

## 2021-07-13 DIAGNOSIS — D631 Anemia in chronic kidney disease: Secondary | ICD-10-CM | POA: Diagnosis not present

## 2021-07-13 DIAGNOSIS — N186 End stage renal disease: Secondary | ICD-10-CM | POA: Diagnosis not present

## 2021-07-13 DIAGNOSIS — E1122 Type 2 diabetes mellitus with diabetic chronic kidney disease: Secondary | ICD-10-CM | POA: Diagnosis not present

## 2021-07-13 DIAGNOSIS — Z794 Long term (current) use of insulin: Secondary | ICD-10-CM | POA: Diagnosis not present

## 2021-07-13 DIAGNOSIS — Z7984 Long term (current) use of oral hypoglycemic drugs: Secondary | ICD-10-CM | POA: Insufficient documentation

## 2021-07-13 DIAGNOSIS — T82868A Thrombosis of vascular prosthetic devices, implants and grafts, initial encounter: Secondary | ICD-10-CM | POA: Diagnosis not present

## 2021-07-13 DIAGNOSIS — N185 Chronic kidney disease, stage 5: Secondary | ICD-10-CM | POA: Diagnosis not present

## 2021-07-13 DIAGNOSIS — E1151 Type 2 diabetes mellitus with diabetic peripheral angiopathy without gangrene: Secondary | ICD-10-CM | POA: Insufficient documentation

## 2021-07-13 DIAGNOSIS — Z992 Dependence on renal dialysis: Secondary | ICD-10-CM | POA: Insufficient documentation

## 2021-07-13 DIAGNOSIS — Z79899 Other long term (current) drug therapy: Secondary | ICD-10-CM | POA: Diagnosis not present

## 2021-07-13 HISTORY — DX: Unspecified dementia, unspecified severity, without behavioral disturbance, psychotic disturbance, mood disturbance, and anxiety: F03.90

## 2021-07-13 HISTORY — PX: AV FISTULA PLACEMENT: SHX1204

## 2021-07-13 LAB — BASIC METABOLIC PANEL
Anion gap: 14 (ref 5–15)
BUN: 28 mg/dL — ABNORMAL HIGH (ref 8–23)
CO2: 30 mmol/L (ref 22–32)
Calcium: 9.8 mg/dL (ref 8.9–10.3)
Chloride: 93 mmol/L — ABNORMAL LOW (ref 98–111)
Creatinine, Ser: 7.14 mg/dL — ABNORMAL HIGH (ref 0.44–1.00)
GFR, Estimated: 5 mL/min — ABNORMAL LOW (ref >60)
Glucose, Bld: 234 mg/dL — ABNORMAL HIGH (ref 70–99)
Potassium: 3.5 mmol/L (ref 3.5–5.1)
Sodium: 137 mmol/L (ref 135–145)

## 2021-07-13 LAB — GLUCOSE, CAPILLARY
Glucose-Capillary: 229 mg/dL — ABNORMAL HIGH (ref 70–99)
Glucose-Capillary: 248 mg/dL — ABNORMAL HIGH (ref 70–99)
Glucose-Capillary: 240 mg/dL — ABNORMAL HIGH (ref 70–99)
Glucose-Capillary: 244 mg/dL — ABNORMAL HIGH (ref 70–99)

## 2021-07-13 SURGERY — ARTERIOVENOUS (AV) FISTULA CREATION
Anesthesia: Monitor Anesthesia Care | Site: Arm Upper | Laterality: Right

## 2021-07-13 MED ORDER — 0.9 % SODIUM CHLORIDE (POUR BTL) OPTIME
TOPICAL | Status: DC | PRN
  Administered 2021-07-13: 1000 mL

## 2021-07-13 MED ORDER — FENTANYL CITRATE (PF) 250 MCG/5ML IJ SOLN
INTRAMUSCULAR | Status: DC | PRN
  Administered 2021-07-13: 25 ug via INTRAVENOUS

## 2021-07-13 MED ORDER — HYDROCODONE-ACETAMINOPHEN 5-325 MG PO TABS: 1.0000 | ORAL_TABLET | Freq: Four times a day (QID) | ORAL | 0 refills | Status: DC | PRN

## 2021-07-13 MED ORDER — VASOPRESSIN 20 UNIT/ML IV SOLN
INTRAVENOUS | Status: AC
  Filled 2021-07-13: qty 1

## 2021-07-13 MED ORDER — HEPARIN 6000 UNIT IRRIGATION SOLUTION
Status: DC | PRN
  Administered 2021-07-13: 1

## 2021-07-13 MED ORDER — FENTANYL CITRATE (PF) 250 MCG/5ML IJ SOLN
INTRAMUSCULAR | Status: AC
  Filled 2021-07-13: qty 5

## 2021-07-13 MED ORDER — SODIUM CHLORIDE 0.9 % IV SOLN: INTRAVENOUS | Status: DC

## 2021-07-13 MED ORDER — CELECOXIB 200 MG PO CAPS: 200.0000 mg | ORAL_CAPSULE | Freq: Once | ORAL | Status: DC

## 2021-07-13 MED ORDER — CHLORHEXIDINE GLUCONATE 0.12 % MT SOLN: 15.0000 mL | Freq: Once | OROMUCOSAL | Status: AC

## 2021-07-13 MED ORDER — FENTANYL CITRATE (PF) 100 MCG/2ML IJ SOLN: 25.0000 ug | INTRAMUSCULAR | Status: DC | PRN

## 2021-07-13 MED ORDER — AMISULPRIDE (ANTIEMETIC) 5 MG/2ML IV SOLN: 10.0000 mg | Freq: Once | INTRAVENOUS | Status: DC | PRN

## 2021-07-13 MED ORDER — LIDOCAINE HCL 1 % IJ SOLN
INTRAMUSCULAR | Status: DC | PRN
  Administered 2021-07-13: 10 mL

## 2021-07-13 MED ORDER — ACETAMINOPHEN 500 MG PO TABS
ORAL_TABLET | ORAL | Status: AC
  Administered 2021-07-13: 1000 mg via ORAL
  Filled 2021-07-13: qty 2

## 2021-07-13 MED ORDER — VANCOMYCIN HCL IN DEXTROSE 1-5 GM/200ML-% IV SOLN
INTRAVENOUS | Status: AC
  Administered 2021-07-13: 1000 mg via INTRAVENOUS
  Filled 2021-07-13: qty 200

## 2021-07-13 MED ORDER — ACETAMINOPHEN 500 MG PO TABS: 1000.0000 mg | ORAL_TABLET | Freq: Once | ORAL | Status: AC

## 2021-07-13 MED ORDER — LIDOCAINE-EPINEPHRINE 1 %-1:100000 IJ SOLN
INTRAMUSCULAR | Status: AC
  Filled 2021-07-13: qty 1

## 2021-07-13 MED ORDER — ORAL CARE MOUTH RINSE: 15.0000 mL | Freq: Once | OROMUCOSAL | Status: AC

## 2021-07-13 MED ORDER — VANCOMYCIN HCL IN DEXTROSE 1-5 GM/200ML-% IV SOLN: 1000.0000 mg | INTRAVENOUS | Status: AC

## 2021-07-13 MED ORDER — HEPARIN SODIUM (PORCINE) 1000 UNIT/ML IJ SOLN
INTRAMUSCULAR | Status: AC
  Filled 2021-07-13: qty 1

## 2021-07-13 MED ORDER — PHENYLEPHRINE HCL-NACL 10-0.9 MG/250ML-% IV SOLN
INTRAVENOUS | Status: DC | PRN
  Administered 2021-07-13: 40 ug/min via INTRAVENOUS

## 2021-07-13 MED ORDER — CHLORHEXIDINE GLUCONATE 4 % EX LIQD: 60.0000 mL | Freq: Once | CUTANEOUS | Status: DC

## 2021-07-13 MED ORDER — LIDOCAINE HCL (PF) 1 % IJ SOLN
INTRAMUSCULAR | Status: AC
  Filled 2021-07-13: qty 30

## 2021-07-13 MED ORDER — INSULIN ASPART PROT & ASPART (70-30 MIX) 100 UNIT/ML ~~LOC~~ SUSP
31.0000 [IU] | Freq: Once | SUBCUTANEOUS | Status: AC
  Administered 2021-07-13: 31 [IU] via SUBCUTANEOUS
  Filled 2021-07-13 (×3): qty 10

## 2021-07-13 MED ORDER — LIDOCAINE-EPINEPHRINE 1 %-1:100000 IJ SOLN
INTRAMUSCULAR | Status: DC | PRN
  Administered 2021-07-13: 20 mL

## 2021-07-13 MED ORDER — HEPARIN 6000 UNIT IRRIGATION SOLUTION
Status: AC
  Filled 2021-07-13: qty 500

## 2021-07-13 MED ORDER — CHLORHEXIDINE GLUCONATE 0.12 % MT SOLN
OROMUCOSAL | Status: AC
  Administered 2021-07-13: 15 mL via OROMUCOSAL
  Filled 2021-07-13: qty 15

## 2021-07-13 MED ORDER — LIDOCAINE 2% (20 MG/ML) 5 ML SYRINGE
INTRAMUSCULAR | Status: DC | PRN
  Administered 2021-07-13: 20 mg via INTRAVENOUS

## 2021-07-13 MED ORDER — CELECOXIB 200 MG PO CAPS
ORAL_CAPSULE | ORAL | Status: AC
  Filled 2021-07-13: qty 1

## 2021-07-13 MED ORDER — PROPOFOL 10 MG/ML IV BOLUS
INTRAVENOUS | Status: DC | PRN
  Administered 2021-07-13 (×2): 5 mg via INTRAVENOUS

## 2021-07-13 MED ORDER — PROPOFOL 500 MG/50ML IV EMUL
INTRAVENOUS | Status: DC | PRN
  Administered 2021-07-13: 50 ug/kg/min via INTRAVENOUS

## 2021-07-13 MED ORDER — PROPOFOL 10 MG/ML IV BOLUS
INTRAVENOUS | Status: AC
  Filled 2021-07-13: qty 20

## 2021-07-13 MED ORDER — PROMETHAZINE HCL 25 MG/ML IJ SOLN: 6.2500 mg | INTRAMUSCULAR | Status: DC | PRN

## 2021-07-13 MED ORDER — ALBUMIN HUMAN 5 % IV SOLN: INTRAVENOUS | Status: DC | PRN

## 2021-07-13 SURGICAL SUPPLY — 36 items
ARMBAND PINK RESTRICT EXTREMIT (MISCELLANEOUS) ×2 IMPLANT
BAG COUNTER SPONGE SURGICOUNT (BAG) ×2 IMPLANT
CANISTER SUCT 3000ML PPV (MISCELLANEOUS) ×2 IMPLANT
CLIP LIGATING EXTRA MED SLVR (CLIP) ×2 IMPLANT
CLIP LIGATING EXTRA SM BLUE (MISCELLANEOUS) ×2 IMPLANT
COVER PROBE W GEL 5X96 (DRAPES) ×2 IMPLANT
DECANTER SPIKE VIAL GLASS SM (MISCELLANEOUS) ×2 IMPLANT
DERMABOND ADVANCED (GAUZE/BANDAGES/DRESSINGS) ×1
DERMABOND ADVANCED .7 DNX12 (GAUZE/BANDAGES/DRESSINGS) ×1 IMPLANT
ELECT REM PT RETURN 9FT ADLT (ELECTROSURGICAL) ×2
ELECTRODE REM PT RTRN 9FT ADLT (ELECTROSURGICAL) ×1 IMPLANT
GAUZE 4X4 16PLY ~~LOC~~+RFID DBL (SPONGE) ×2 IMPLANT
GLOVE SRG 8 PF TXTR STRL LF DI (GLOVE) ×1 IMPLANT
GLOVE SURG ENC MOIS LTX SZ7.5 (GLOVE) ×2 IMPLANT
GLOVE SURG UNDER POLY LF SZ6.5 (GLOVE) ×2 IMPLANT
GLOVE SURG UNDER POLY LF SZ7 (GLOVE) ×4 IMPLANT
GLOVE SURG UNDER POLY LF SZ8 (GLOVE) ×2
GOWN STRL REUS W/ TWL LRG LVL3 (GOWN DISPOSABLE) ×2 IMPLANT
GOWN STRL REUS W/ TWL XL LVL3 (GOWN DISPOSABLE) ×1 IMPLANT
GOWN STRL REUS W/TWL LRG LVL3 (GOWN DISPOSABLE) ×4
GOWN STRL REUS W/TWL XL LVL3 (GOWN DISPOSABLE) ×2
GRAFT GORETEX STRT 4-7X45 (Vascular Products) ×2 IMPLANT
INSERT FOGARTY SM (MISCELLANEOUS) ×2 IMPLANT
KIT BASIN OR (CUSTOM PROCEDURE TRAY) ×2 IMPLANT
KIT TURNOVER KIT B (KITS) ×2 IMPLANT
NS IRRIG 1000ML POUR BTL (IV SOLUTION) ×2 IMPLANT
PACK CV ACCESS (CUSTOM PROCEDURE TRAY) ×2 IMPLANT
PAD ARMBOARD 7.5X6 YLW CONV (MISCELLANEOUS) ×4 IMPLANT
SPONGE T-LAP 18X18 ~~LOC~~+RFID (SPONGE) ×2 IMPLANT
SUT MNCRL AB 4-0 PS2 18 (SUTURE) ×4 IMPLANT
SUT PROLENE 6 0 BV (SUTURE) ×6 IMPLANT
SUT VIC AB 3-0 SH 27 (SUTURE) ×4
SUT VIC AB 3-0 SH 27X BRD (SUTURE) ×2 IMPLANT
TOWEL GREEN STERILE (TOWEL DISPOSABLE) ×2 IMPLANT
UNDERPAD 30X36 HEAVY ABSORB (UNDERPADS AND DIAPERS) ×2 IMPLANT
WATER STERILE IRR 1000ML POUR (IV SOLUTION) ×2 IMPLANT

## 2021-07-13 NOTE — H&P (Signed)
HPI:   Katie Garcia is a 80 y.o. female history of bilateral upper extremity access.  She had thrombosed right upper extremity fistula she underwent central venography which demonstrated patency bilaterally.  We plan for left upper extremity AV graft which she has now undergone.  She does have some numbness of her distal fingers on the left.  Wounds are healing well she does have significant bruising.  She is dialyzing via IJ catheter which is working well although she has having complications of hypotension.  She is on dialysis Tuesdays, Thursdays and Saturdays on [address].  She does not take blood thinners.       Past Medical History:  Diagnosis Date   Anemia     Arthritis     Chronic kidney disease      Dialysis T/Th/Sa at [address]   Diabetes mellitus without complication ([place])     GERD (gastroesophageal reflux disease)     Gout     Headache     Hypertension     Type 2 diabetes mellitus ([place])           Family History  Problem Relation Age of Onset   Diabetes Mother           Past Surgical History:  Procedure Laterality Date   ABDOMINAL HYSTERECTOMY       AV FISTULA PLACEMENT Left 10/02/2016    Procedure: ARTERIOVENOUS (AV) FISTULA CREATION LEFT UPPER ARM;  Surgeon: Waynetta Sandy, MD;  Location: [place];  Service: Vascular;  Laterality: Left;   AV FISTULA PLACEMENT Right 07/14/2020    Procedure: RIGHT ARM ARTERIOVENOUS FISTULA CREATION;  Surgeon: Waynetta Sandy, MD;  Location: [place];  Service: Vascular;  Laterality: Right;   AV FISTULA PLACEMENT Left 05/16/2021    Procedure: INSERTION OF LEFT UPPER ARM ARTERIOVENOUS (AV) GORE-TEX GRAFT;  Surgeon: Marty Heck, MD;  Location: [facility];  Service: Vascular;  Laterality: Left;   [place] Left 05/17/2020    Procedure: Insertion of Left arm arteriovenous gortex graftarm ;  Surgeon: Angelia Mould, MD;  Location: [place];  Service: Vascular;  Laterality: Left;   CESAREAN  SECTION       DIALYSIS/PERMA CATHETER INSERTION       REVISON OF ARTERIOVENOUS FISTULA Right 09/15/2020    Procedure: REVISON OF RIGHT UPPER ARM  ARTERIOVENOUS FISTULA;  Surgeon: Waynetta Sandy, MD;  Location: [place];  Service: Vascular;  Laterality: Right;   UPPER EXTREMITY VENOGRAPHY Bilateral 04/18/2021    Procedure: UPPER EXTREMITY VENOGRAPHY;  Surgeon: Waynetta Sandy, MD;  Location: [facility];  Service: Cardiovascular;  Laterality: Bilateral;      Short Social History:  Social History        Tobacco Use   Smoking status: Never Smoker   Smokeless tobacco: Never Used  Substance Use Topics   Alcohol use: Not Currently           Allergies  Allergen Reactions   Penicillins Rash      Did it involve swelling of the face/tongue/throat, SOB, or low BP? No Did it involve sudden or severe rash/hives, skin peeling, or any reaction on the inside of your mouth or nose? Yes Did you need to seek medical attention at a hospital or doctor's office? N/A When did it last happen? N/A      If all above answers are "NO", may proceed with cephalosporin use.  Current Outpatient Medications  Medication Sig Dispense Refill   Accu-Chek FastClix Lancets MISC         ACCU-CHEK GUIDE test strip 1 each by Other route in the morning and at bedtime. E11.22   1   acetaminophen (TYLENOL) 500 MG tablet Take 500-1,000 mg by mouth every 6 (six) hours as needed for moderate pain or headache.       allopurinol (ZYLOPRIM) 100 MG tablet Take 100 mg by mouth at bedtime as needed (Flair up). After dialysis       B Complex-C-Zn-Folic Acid (DIALYVITE/ZINC) TABS Take 1 tablet by mouth daily with supper.   3   Blood Glucose Monitoring Suppl (ACCU-CHEK GUIDE) w/Device KIT         calcium acetate (PHOSLO) 667 MG capsule Take 2,001 mg by mouth with breakfast, with lunch, and with evening meal. Take 2 capsules (2001 mg) by mouth twice daily with meals on dialysis days (Tuesdays,  Thursdays & Saturdays) Take 2 capsules ($RemoveBef'2001mg'[id number]$ ) by mouth 3 times daily with meals on non-dialysis days (Sundays, Mondays, Wednesdays & Fridays)   3   Calcium Carbonate Antacid (TUMS E-X PO) Take 2 tablets by mouth daily as needed (acid reflux).       carvedilol (COREG) 25 MG tablet Take 25 mg by mouth in the morning and at bedtime.       famotidine (PEPCID) 20 MG tablet Take 20 mg by mouth at bedtime.       guaiFENesin (MUCINEX) 600 MG 12 hr tablet Take 600 mg by mouth daily as needed for to loosen phlegm.       guaifenesin (ROBITUSSIN) 100 MG/5ML syrup Take 100 mg by mouth 3 (three) times daily as needed for cough.       HYDROcodone-acetaminophen (NORCO/VICODIN) 5-325 MG tablet Take 1 tablet by mouth every 6 (six) hours as needed for moderate pain. 20 tablet 0   insulin NPH-regular Human (70-30) 100 UNIT/ML injection Inject 30 Units into the skin daily with breakfast. 30 mL 3   iron sucrose in sodium chloride 0.9 % 100 mL Iron Sucrose (Venofer)       loratadine (CLARITIN) 10 MG tablet Take 10 mg by mouth in the morning.       losartan (COZAAR) 25 MG tablet Take 25 mg by mouth at bedtime.       meclizine (ANTIVERT) 25 MG tablet Take 1 tablet (25 mg total) by mouth 3 (three) times daily as needed for dizziness. 30 tablet 0   Methoxy PEG-Epoetin Beta (MIRCERA IJ) Mircera       Nutritional Supplements (FEEDING SUPPLEMENT, NEPRO CARB STEADY,) LIQD Take 237 mLs by mouth every Tuesday, Thursday, and Saturday at 6 PM.       RELION INSULIN SYR 0.5ML/31G 31G X 5/16" 0.5 ML MISC USE 1 ONCE DAILY       rosuvastatin (CRESTOR) 10 MG tablet Take 10 mg by mouth daily.       triamcinolone cream (KENALOG) 0.1 % Apply 1 application topically daily as needed (skin irritation/rash).                 Current Facility-Administered Medications  Medication Dose Route Frequency Provider Last Rate Last Admin   0.9 %  sodium chloride infusion  250 mL Intravenous PRN Waynetta Sandy, MD       sodium chloride  flush (NS) 0.9 % injection 3 mL  3 mL Intravenous Q12H Waynetta Sandy, MD       sodium chloride flush (NS) 0.9 %  injection 3 mL  3 mL Intravenous PRN Waynetta Sandy, MD          Review of Systems  Constitutional:  Constitutional negative. HENT: HENT negative. Eyes: Eyes negative. Respiratory: Respiratory negative. Cardiovascular: Cardiovascular negative. GI: Gastrointestinal negative. Musculoskeletal: Musculoskeletal negative. Skin: Skin negative. Neurological: Positive for numbness. Hematologic: Positive for bruises/bleeds easily. Psychiatric: Psychiatric negative.          Objective:    Vitals:   07/13/21 0631  BP: (!) 174/81  Pulse: 86  Resp: 20  Temp: 98.9 F (37.2 C)  SpO2: 99%      Physical Exam HENT:    Head: Normocephalic.     Nose:     Comments: Wearing a mask Eyes:    Pupils: Pupils are equal, round, and reactive to light.  Cardiovascular:    Pulses:          Radial pulses are 1+ on the right side and 1+ on the left side.  Pulmonary:    Effort: Pulmonary effort is normal.  Abdominal:     General: Abdomen is flat. Musculoskeletal:     Comments: No flow identifiable and previously placed left upper arm AV graft  Skin:    Capillary Refill: Capillary refill takes less than 2 seconds.     Comments: Hematoma left upper extremity overlying palpable graft  Neurological:    Mental Status: She is alert.  Psychiatric:        Mood and Affect: Mood normal.        Behavior: Behavior normal.        Thought Content: Thought content normal.       Data: AVG                 PSV (cm/s)Flow Vol (mL/min)Describe  +--------------------+----------+-----------------+--------+  Arterial anastomosis                           occluded  +--------------------+----------+-----------------+--------+  Prox graft                                     occluded  +--------------------+----------+-----------------+--------+  Mid graft                                       occluded  +--------------------+----------+-----------------+--------+  Distal graft                                   occluded  +--------------------+----------+-----------------+--------+  Venous anastomosis                             occluded  +--------------------+----------+-----------------+--------+       Summary:  Occluded left upper arm AV Graft.          Assessment/Plan:       80 year old female with previous bilateral upper extremity access procedures most recently we placed a left arm AV graft which subsequently thrombosed.  She had had central venography prior which demonstrated patency bilaterally.  She does have possible suitable basilic vein for fistula creation.  We will plan for right upper extremity AV fistula versus graft on a nondialysis day in the near future.  She will continue to use her catheter until  that time.  I expect that the numbness in her fingers will continue to improve and that the bruising will resolve in her upper arm.         Waynetta Sandy MD [facility]

## 2021-07-13 NOTE — Progress Notes (Signed)
Notified Dr. Tobias Alexander and Dr. Donzetta Matters of BMP result for K showing 3.5

## 2021-07-13 NOTE — Telephone Encounter (Signed)
Patient had surgery today and given insulin 31 units of Noovlog 70/30 and her glucose is still 223. Daughter wants to make sure she was not given the wrong insulin and if she should take any more insulin.

## 2021-07-13 NOTE — Anesthesia Postprocedure Evaluation (Signed)
Anesthesia Post Note  Patient: Katie Garcia  Procedure(s) Performed: RIGHT ARM ARTERIOVENOUS (AV) FISTULA GRAFT INSERTION (Right: Arm Upper)     Patient location during evaluation: PACU Anesthesia Type: MAC Level of consciousness: awake and alert Pain management: pain level controlled Vital Signs Assessment: post-procedure vital signs reviewed and stable Respiratory status: spontaneous breathing and respiratory function stable Cardiovascular status: stable Postop Assessment: no apparent nausea or vomiting Anesthetic complications: no   No notable events documented.  Last Vitals:  Vitals:   07/13/21 1100 07/13/21 1115  BP: (!) 167/80 (!) 158/86  Pulse: 70 71  Resp: 11 13  Temp:  (!) 36.3 C  SpO2: 100% 100%    Last Pain:  Vitals:   07/13/21 1115  TempSrc:   PainSc: 0-No pain                 Julies Carmickle DANIEL

## 2021-07-13 NOTE — Telephone Encounter (Signed)
Please Advise

## 2021-07-13 NOTE — Progress Notes (Signed)
Dr. Donzetta Matters aware of patient's K of 7.4.  Will notify Dr. Donzetta Matters once we have BMP results.  Patient placed on monitor.

## 2021-07-13 NOTE — Progress Notes (Signed)
I Stat revealed a K of 7.4, notified Dr. Tobias Alexander.  Will get another sample for BMP stat and asked to notify Dr. Donzetta Matters.

## 2021-07-13 NOTE — Discharge Instructions (Signed)
[facility]  Discharge Instructions  AV Fistula or Graft Surgery for Dialysis Access  Please refer to the following instructions for your post-procedure care. Your surgeon or physician assistant will discuss any changes with you.  Activity  You may drive the day following your surgery, if you are comfortable and no longer taking prescription pain medication. Resume full activity as the soreness in your incision resolves.  Bathing/Showering  You may shower after you go home. Keep your incision dry for 48 hours. Do not soak in a bathtub, hot tub, or swim until the incision heals completely. You may not shower if you have a hemodialysis catheter.  Incision Care  Clean your incision with mild soap and water after 48 hours. Pat the area dry with a clean towel. You do not need a bandage unless otherwise instructed. Do not apply any ointments or creams to your incision. You may have skin glue on your incision. Do not peel it off. It will come off on its own in about one week. Your arm may swell a bit after surgery. To reduce swelling use pillows to elevate your arm so it is above your heart. Your doctor will tell you if you need to lightly wrap your arm with an ACE bandage.  Diet  Resume your normal diet. There are not special food restrictions following this procedure. In order to heal from your surgery, it is CRITICAL to get adequate nutrition. Your body requires vitamins, minerals, and protein. Vegetables are the best source of vitamins and minerals. Vegetables also provide the perfect balance of protein. Processed food has little nutritional value, so try to avoid this.  Medications  Resume taking all of your medications. If your incision is causing pain, you may take over-the counter pain relievers such as acetaminophen (Tylenol). If you were prescribed a stronger pain medication, please be aware these medications can cause nausea and constipation. Prevent  nausea by taking the medication with a snack or meal. Avoid constipation by drinking plenty of fluids and eating foods with high amount of fiber, such as fruits, vegetables, and grains.  Do not take Tylenol if you are taking prescription pain medications.  Follow up Your surgeon may want to see you in the office following your access surgery. If so, this will be arranged at the time of your surgery.  Please call us immediately for any of the following conditions:  Increased pain, redness, drainage (pus) from your incision site Fever of 101 degrees or higher Severe or worsening pain at your incision site Hand pain or numbness.  Reduce your risk of vascular disease:  Stop smoking. If you would like help, call [facility] at 1-800-QUIT-NOW 442-010-0112) or [place] at [place] your cholesterol Maintain a desired weight Control your diabetes Keep your blood pressure down  Dialysis  It will take several weeks to several months for your new dialysis access to be ready for use. Your surgeon will determine when it is okay to use it. Your nephrologist will continue to direct your dialysis. You can continue to use your Permcath until your new access is ready for use.   07/13/2021 Katie Garcia [id number] 01/24/1941  Surgeon(s): Waynetta Sandy, MD  Procedure(s): RIGHT ARM ARTERIOVENOUS (AV) FISTULA GRAFT INSERTION   May stick graft immediately   May stick graft on designated area only:   X Do not stick right AV graft for 4-6 weeks    If you have any questions, please call the  office at [phone].

## 2021-07-13 NOTE — Transfer of Care (Signed)
Immediate Anesthesia Transfer of Care Note  Patient: Katie Garcia  Procedure(s) Performed: RIGHT ARM ARTERIOVENOUS (AV) FISTULA GRAFT INSERTION (Right: Arm Upper)  Patient Location: PACU  Anesthesia Type:MAC  Level of Consciousness: awake, alert  and oriented  Airway & Oxygen Therapy: Patient Spontanous Breathing  Post-op Assessment: Report given to RN and Post -op Vital signs reviewed and stable  Post vital signs: Reviewed and stable  Last Vitals:  Vitals Value Taken Time  BP 153/68 07/13/21 0959  Temp    Pulse 72 07/13/21 1002  Resp 18 07/13/21 1002  SpO2 100 % 07/13/21 1002  Vitals shown include unvalidated device data.  Last Pain:  Vitals:   07/13/21 0723  TempSrc:   PainSc: 0-No pain         Complications: No notable events documented.

## 2021-07-13 NOTE — Op Note (Signed)
    Patient name: Katie Garcia MRN: [id number] DOB: 01-12-1941 Sex: female  07/13/2021 Pre-operative Diagnosis: ESRD Post-operative diagnosis:  Same Surgeon:  Erlene Quan C. Donzetta Matters, MD Assistant: Paulo Fruit, PA Procedure Performed:  Right arm brachial artery to axillary vein AV graft with 4-7 mm stretch PTFE  Indications: 80 year old female currently on dialysis via catheter.  She has a history of bilateral upper extremity accesses.  She has previously undergone venography bilateral upper extremities with plan for left arm AV graft which then subsequently failed.  We are now planning for right arm basilic vein fistula versus graft placement.  We discussed the risk benefits alternatives and she demonstrates good understanding and agrees to proceed.  An assistant was necessary to expedite the case.  Findings: The axillary vein was suitable size for end-to-end anastomosis.  Brachial artery measured approximately 3 mm was free of disease.  At completion there was a very strong thrill confirmed with Doppler in the axillary vein and good thrill in the graft as well.  There was a radial artery signal at the wrist which was palpable in the recovery area.   Procedure:  The patient was identified in the holding area and taken to the operating room where she is placed supine on upper table and MAC anesthesia induced.  She was sterilely prepped draped in the right upper extremity usual fashion, antibiotics were administered and a timeout was called.  We began using ultrasound to attempt to find vein in the upper arm but I could not identify any suitable vein.  The basilic vein itself was quite sclerotic and diminutive.  We then identified the brachial artery in the axillary vein which looked accessible for access creation.  We used 1% lidocaine with and without epinephrine to numb the expected incision sites as well as tunnel tract.  We then made incision above the antecubitum dissected down through the 2 heads of  the biceps identify the brachial artery placed Vesseloops around this.  An incision was then made in the axilla we dissected down to the axillary vein.  The nerve was protected.  We marked the vein for orientation.  We then tunneled a 4-7 mm graft between the 2 incisions.  The vein was then clamped proximally and distally tied off and transected.  It was flushed with heparinized saline.  The graft was trimmed to size and sewn end to end with 6-0 Prolene suture.  Upon completion we then flushed through the graft and reclamped it.  We clamped the brachial artery then through our distal anastomosis proximally distally.  We opened longitudinally.  We flushed with heparinized saline distally.  We trimmed the graft to size and sewn end-to-side with 6-0 Prolene suture.  Prior completion of flushing all directions.  Upon completion there was good flow through the graft confirmed with Doppler in the axillary vein.  We did have a suitable signal in the radial artery at the wrist.  We irrigated the wounds obtaining the stasis closed in layers of Vicryl Monocryl.  She was awakened from anesthesia having tolerated procedure without any complication.  All counts were correct at completion of  EBL: 50 cc   Katie Garcia C. Donzetta Matters, MD [facility] [place] Office: [phone] Pager: [phone]

## 2021-07-14 ENCOUNTER — Encounter (HOSPITAL_COMMUNITY): Payer: Self-pay | Admitting: Vascular Surgery

## 2021-07-14 DIAGNOSIS — R519 Headache, unspecified: Secondary | ICD-10-CM | POA: Diagnosis not present

## 2021-07-14 DIAGNOSIS — T8249XA Other complication of vascular dialysis catheter, initial encounter: Secondary | ICD-10-CM | POA: Diagnosis not present

## 2021-07-14 DIAGNOSIS — Z992 Dependence on renal dialysis: Secondary | ICD-10-CM | POA: Diagnosis not present

## 2021-07-14 DIAGNOSIS — N2581 Secondary hyperparathyroidism of renal origin: Secondary | ICD-10-CM | POA: Diagnosis not present

## 2021-07-14 DIAGNOSIS — E1129 Type 2 diabetes mellitus with other diabetic kidney complication: Secondary | ICD-10-CM | POA: Diagnosis not present

## 2021-07-14 DIAGNOSIS — D688 Other specified coagulation defects: Secondary | ICD-10-CM | POA: Diagnosis not present

## 2021-07-14 DIAGNOSIS — N186 End stage renal disease: Secondary | ICD-10-CM | POA: Diagnosis not present

## 2021-07-14 DIAGNOSIS — D631 Anemia in chronic kidney disease: Secondary | ICD-10-CM | POA: Diagnosis not present

## 2021-07-14 NOTE — Telephone Encounter (Signed)
Called and spoke with patient she said that he  cbg did go down , I advised patient if goes back up please give ours call back.

## 2021-07-15 ENCOUNTER — Telehealth: Payer: Self-pay

## 2021-07-15 NOTE — Telephone Encounter (Signed)
Patient's daughter calls today to report that patient is having copious amounts of reddish clear drainage from her AVG site placed on 7/20. She is saturating a 4x4 about every 2 hours. Site is not red, hot or swollen. Denies fever or chills. Discussed with PA, advised to keep site clean and dry - could wash with mild soap and water. Patient may continue to use 4x4 or maxi pad to help manage drainage, and wrap arm gently with an ace. Discussed signs and symptoms of infection, patient placed on PA schedule Monday for wound check.

## 2021-07-16 DIAGNOSIS — D631 Anemia in chronic kidney disease: Secondary | ICD-10-CM | POA: Diagnosis not present

## 2021-07-16 DIAGNOSIS — E1129 Type 2 diabetes mellitus with other diabetic kidney complication: Secondary | ICD-10-CM | POA: Diagnosis not present

## 2021-07-16 DIAGNOSIS — R519 Headache, unspecified: Secondary | ICD-10-CM | POA: Diagnosis not present

## 2021-07-16 DIAGNOSIS — Z992 Dependence on renal dialysis: Secondary | ICD-10-CM | POA: Diagnosis not present

## 2021-07-16 DIAGNOSIS — N186 End stage renal disease: Secondary | ICD-10-CM | POA: Diagnosis not present

## 2021-07-16 DIAGNOSIS — T8249XA Other complication of vascular dialysis catheter, initial encounter: Secondary | ICD-10-CM | POA: Diagnosis not present

## 2021-07-16 DIAGNOSIS — N2581 Secondary hyperparathyroidism of renal origin: Secondary | ICD-10-CM | POA: Diagnosis not present

## 2021-07-16 DIAGNOSIS — D688 Other specified coagulation defects: Secondary | ICD-10-CM | POA: Diagnosis not present

## 2021-07-18 ENCOUNTER — Other Ambulatory Visit: Payer: Self-pay

## 2021-07-18 ENCOUNTER — Ambulatory Visit (INDEPENDENT_AMBULATORY_CARE_PROVIDER_SITE_OTHER): Payer: Medicare Other | Admitting: Physician Assistant

## 2021-07-18 VITALS — BP 155/71 | HR 93 | Temp 98.2°F | Resp 20 | Ht 60.0 in | Wt 200.3 lb

## 2021-07-18 DIAGNOSIS — N186 End stage renal disease: Secondary | ICD-10-CM

## 2021-07-18 DIAGNOSIS — L0390 Cellulitis, unspecified: Secondary | ICD-10-CM

## 2021-07-18 MED ORDER — CLINDAMYCIN HCL 300 MG PO CAPS: 300.0000 mg | ORAL_CAPSULE | Freq: Three times a day (TID) | ORAL | 0 refills | Status: DC

## 2021-07-18 NOTE — Progress Notes (Signed)
POST OPERATIVE DIALYSIS ACCESS OFFICE NOTE    CC:  F/u for dialysis access surgery  HPI:  This is a 80 y.o. female who is s/p right arm brachial artery to axillary vein AV graft with 4-7 mm stretch PTFE on 07/13/2021 by Dr Donzetta Matters. Presents today complaining of swelling and redness at incisions. She had mild post-op bleeding which then turned serous in nature. No drainage now, no hand pain, fever or chills. She complains of had numbness.  Currently on dialysis via catheter.  She has a history of bilateral upper extremity accesses.  She has previously undergone venography bilateral upper extremities with plan for left arm AV graft which then subsequently failed.   Dialysis days:  TTS   Dialysis center:  [facility]  Allergies  Allergen Reactions   Penicillins Rash    Did it involve swelling of the face/tongue/throat, SOB, or low BP? No Did it involve sudden or severe rash/hives, skin peeling, or any reaction on the inside of your mouth or nose? Yes Did you need to seek medical attention at a hospital or doctor's office? N/A When did it last happen? N/A      If all above answers are "NO", may proceed with cephalosporin use.    Current Outpatient Medications  Medication Sig Dispense Refill   Accu-Chek FastClix Lancets MISC      ACCU-CHEK GUIDE test strip 1 each by Other route in the morning and at bedtime. E11.22  1   acetaminophen (TYLENOL) 500 MG tablet Take 500-1,000 mg by mouth every 6 (six) hours as needed for moderate pain or headache.     allopurinol (ZYLOPRIM) 100 MG tablet Take 100 mg by mouth every other day. After dialysis     B Complex-C-Zn-Folic Acid (DIALYVITE 759-FMBW 15) 0.8 MG TABS Take 1 tablet by mouth daily with supper.     Blood Glucose Monitoring Suppl (ACCU-CHEK GUIDE) w/Device KIT      calcium acetate (PHOSLO) 667 MG capsule Take 2,001 mg by mouth See admin instructions. Take 2 capsules (2001 mg) by mouth twice daily with meals on dialysis days (Tuesdays,  Thursdays & Saturdays) Take 2 capsules (2037m) by mouth 3 times daily with meals on non-dialysis days (Sundays, Mondays, Wednesdays & Fridays)  3   carvedilol (COREG) 25 MG tablet Take 25 mg by mouth in the morning and at bedtime.      famotidine (PEPCID) 20 MG tablet Take 20 mg by mouth at bedtime.     guaiFENesin (MUCINEX) 600 MG 12 hr tablet Take 600 mg by mouth daily as needed for to loosen phlegm.     guaifenesin (ROBITUSSIN) 100 MG/5ML syrup Take 100 mg by mouth 3 (three) times daily as needed for cough.     HYDROcodone-acetaminophen (NORCO/VICODIN) 5-325 MG tablet Take 1 tablet by mouth every 6 (six) hours as needed for moderate pain. 20 tablet 0   insulin NPH-regular Human (70-30) 100 UNIT/ML injection Inject 31 Units into the skin daily with breakfast. 30 mL 3   iron sucrose in sodium chloride 0.9 % 100 mL Iron Sucrose (Venofer)     loratadine (CLARITIN) 10 MG tablet Take 10 mg by mouth in the morning.     losartan (COZAAR) 25 MG tablet Take 25 mg by mouth at bedtime.     meclizine (ANTIVERT) 25 MG tablet Take 1 tablet (25 mg total) by mouth 3 (three) times daily as needed for dizziness. 30 tablet 0   Methoxy PEG-Epoetin Beta (MIRCERA IJ) Mircera     Nutritional  Supplements (FEEDING SUPPLEMENT, NEPRO CARB STEADY,) LIQD Take 237 mLs by mouth every Tuesday, Thursday, and Saturday at 6 PM.     RELION INSULIN SYR 0.5ML/31G 31G X 5/16" 0.5 ML MISC USE 1 ONCE DAILY     rosuvastatin (CRESTOR) 10 MG tablet Take 10 mg by mouth every evening.     Current Facility-Administered Medications  Medication Dose Route Frequency Provider Last Rate Last Admin   0.9 %  sodium chloride infusion  250 mL Intravenous PRN Waynetta Sandy, MD       sodium chloride flush (NS) 0.9 % injection 3 mL  3 mL Intravenous Q12H Waynetta Sandy, MD       sodium chloride flush (NS) 0.9 % injection 3 mL  3 mL Intravenous PRN Waynetta Sandy, MD         ROS:  See HPI  BP (!) 155/71 (BP  Location: Left Arm, Patient Position: Sitting, Cuff Size: Normal)   Pulse 93   Temp 98.2 F (36.8 C) (Temporal)   Resp 20   Ht 5' (1.524 m)   Wt 200 lb 4.8 oz (90.9 kg)   SpO2 99%   BMI 39.12 kg/m    Physical Exam: General appearance: awake, alert in NAD Chest: catheter site without bleeding or erythema. Dressing dry and intact. Respirations: unlabored; no dyspnea at rest Right upper extremity: Hand is warm with 5/5 grip strength. Motor function and sensation intact. Good bruit and thrill in graft. Ulnar > radial Doppler signal. Brisk palmar arch signal Incision(s): Well approximated. No active bleeding or drainage. Edema mostly confined to [facility]. Mild erythema.     Assessment/Plan:   AVG reaction versus early cellulitis versus infected hematoma. Area around [facility] is not tense so I do not think this is an expanding hematoma. Will prescribe clindamycin after HD treatment as she is allergic to PCN. Short interval follow-up. Instructed her to call should she develop cloudy drainage, fever or malaise.  Barbie Banner, PA-C 07/18/2021 3:23 PM [facility] [phone]  Clinic MD:  Trula Slade

## 2021-07-19 DIAGNOSIS — D688 Other specified coagulation defects: Secondary | ICD-10-CM | POA: Diagnosis not present

## 2021-07-19 DIAGNOSIS — Z992 Dependence on renal dialysis: Secondary | ICD-10-CM | POA: Diagnosis not present

## 2021-07-19 DIAGNOSIS — N2581 Secondary hyperparathyroidism of renal origin: Secondary | ICD-10-CM | POA: Diagnosis not present

## 2021-07-19 DIAGNOSIS — N186 End stage renal disease: Secondary | ICD-10-CM | POA: Diagnosis not present

## 2021-07-19 DIAGNOSIS — T8249XA Other complication of vascular dialysis catheter, initial encounter: Secondary | ICD-10-CM | POA: Diagnosis not present

## 2021-07-19 DIAGNOSIS — D631 Anemia in chronic kidney disease: Secondary | ICD-10-CM | POA: Diagnosis not present

## 2021-07-19 DIAGNOSIS — R519 Headache, unspecified: Secondary | ICD-10-CM | POA: Diagnosis not present

## 2021-07-19 DIAGNOSIS — E1129 Type 2 diabetes mellitus with other diabetic kidney complication: Secondary | ICD-10-CM | POA: Diagnosis not present

## 2021-07-21 DIAGNOSIS — Z992 Dependence on renal dialysis: Secondary | ICD-10-CM | POA: Diagnosis not present

## 2021-07-21 DIAGNOSIS — N2581 Secondary hyperparathyroidism of renal origin: Secondary | ICD-10-CM | POA: Diagnosis not present

## 2021-07-21 DIAGNOSIS — T8249XA Other complication of vascular dialysis catheter, initial encounter: Secondary | ICD-10-CM | POA: Diagnosis not present

## 2021-07-21 DIAGNOSIS — N186 End stage renal disease: Secondary | ICD-10-CM | POA: Diagnosis not present

## 2021-07-21 DIAGNOSIS — D631 Anemia in chronic kidney disease: Secondary | ICD-10-CM | POA: Diagnosis not present

## 2021-07-21 DIAGNOSIS — D688 Other specified coagulation defects: Secondary | ICD-10-CM | POA: Diagnosis not present

## 2021-07-21 DIAGNOSIS — E1129 Type 2 diabetes mellitus with other diabetic kidney complication: Secondary | ICD-10-CM | POA: Diagnosis not present

## 2021-07-21 DIAGNOSIS — R519 Headache, unspecified: Secondary | ICD-10-CM | POA: Diagnosis not present

## 2021-07-23 DIAGNOSIS — N186 End stage renal disease: Secondary | ICD-10-CM | POA: Diagnosis not present

## 2021-07-23 DIAGNOSIS — T8249XA Other complication of vascular dialysis catheter, initial encounter: Secondary | ICD-10-CM | POA: Diagnosis not present

## 2021-07-23 DIAGNOSIS — E1129 Type 2 diabetes mellitus with other diabetic kidney complication: Secondary | ICD-10-CM | POA: Diagnosis not present

## 2021-07-23 DIAGNOSIS — D631 Anemia in chronic kidney disease: Secondary | ICD-10-CM | POA: Diagnosis not present

## 2021-07-23 DIAGNOSIS — Z992 Dependence on renal dialysis: Secondary | ICD-10-CM | POA: Diagnosis not present

## 2021-07-23 DIAGNOSIS — D688 Other specified coagulation defects: Secondary | ICD-10-CM | POA: Diagnosis not present

## 2021-07-23 DIAGNOSIS — R519 Headache, unspecified: Secondary | ICD-10-CM | POA: Diagnosis not present

## 2021-07-23 DIAGNOSIS — N2581 Secondary hyperparathyroidism of renal origin: Secondary | ICD-10-CM | POA: Diagnosis not present

## 2021-07-24 DIAGNOSIS — Z992 Dependence on renal dialysis: Secondary | ICD-10-CM | POA: Diagnosis not present

## 2021-07-24 DIAGNOSIS — E1122 Type 2 diabetes mellitus with diabetic chronic kidney disease: Secondary | ICD-10-CM | POA: Diagnosis not present

## 2021-07-24 DIAGNOSIS — N186 End stage renal disease: Secondary | ICD-10-CM | POA: Diagnosis not present

## 2021-07-26 DIAGNOSIS — N186 End stage renal disease: Secondary | ICD-10-CM | POA: Diagnosis not present

## 2021-07-26 DIAGNOSIS — Z992 Dependence on renal dialysis: Secondary | ICD-10-CM | POA: Diagnosis not present

## 2021-07-26 DIAGNOSIS — D509 Iron deficiency anemia, unspecified: Secondary | ICD-10-CM | POA: Diagnosis not present

## 2021-07-26 DIAGNOSIS — R519 Headache, unspecified: Secondary | ICD-10-CM | POA: Diagnosis not present

## 2021-07-26 DIAGNOSIS — T8249XA Other complication of vascular dialysis catheter, initial encounter: Secondary | ICD-10-CM | POA: Diagnosis not present

## 2021-07-26 DIAGNOSIS — E1129 Type 2 diabetes mellitus with other diabetic kidney complication: Secondary | ICD-10-CM | POA: Diagnosis not present

## 2021-07-26 DIAGNOSIS — D688 Other specified coagulation defects: Secondary | ICD-10-CM | POA: Diagnosis not present

## 2021-07-26 DIAGNOSIS — N2581 Secondary hyperparathyroidism of renal origin: Secondary | ICD-10-CM | POA: Diagnosis not present

## 2021-07-26 DIAGNOSIS — D631 Anemia in chronic kidney disease: Secondary | ICD-10-CM | POA: Diagnosis not present

## 2021-07-27 LAB — POCT I-STAT, CHEM 8
BUN: 46 mg/dL — ABNORMAL HIGH (ref 8–23)
Calcium, Ion: 0.91 mmol/L — ABNORMAL LOW (ref 1.15–1.40)
Chloride: 100 mmol/L (ref 98–111)
Creatinine, Ser: 7.5 mg/dL — ABNORMAL HIGH (ref 0.44–1.00)
Glucose, Bld: 232 mg/dL — ABNORMAL HIGH (ref 70–99)
HCT: 38 % (ref 36.0–46.0)
Hemoglobin: 12.9 g/dL (ref 12.0–15.0)
Potassium: 7.4 mmol/L (ref 3.5–5.1)
Sodium: 131 mmol/L — ABNORMAL LOW (ref 135–145)
TCO2: 30 mmol/L (ref 22–32)

## 2021-07-28 DIAGNOSIS — Z992 Dependence on renal dialysis: Secondary | ICD-10-CM | POA: Diagnosis not present

## 2021-07-28 DIAGNOSIS — D631 Anemia in chronic kidney disease: Secondary | ICD-10-CM | POA: Diagnosis not present

## 2021-07-28 DIAGNOSIS — D688 Other specified coagulation defects: Secondary | ICD-10-CM | POA: Diagnosis not present

## 2021-07-28 DIAGNOSIS — D509 Iron deficiency anemia, unspecified: Secondary | ICD-10-CM | POA: Diagnosis not present

## 2021-07-28 DIAGNOSIS — E1129 Type 2 diabetes mellitus with other diabetic kidney complication: Secondary | ICD-10-CM | POA: Diagnosis not present

## 2021-07-28 DIAGNOSIS — N2581 Secondary hyperparathyroidism of renal origin: Secondary | ICD-10-CM | POA: Diagnosis not present

## 2021-07-28 DIAGNOSIS — N186 End stage renal disease: Secondary | ICD-10-CM | POA: Diagnosis not present

## 2021-07-28 DIAGNOSIS — T8249XA Other complication of vascular dialysis catheter, initial encounter: Secondary | ICD-10-CM | POA: Diagnosis not present

## 2021-07-28 DIAGNOSIS — R519 Headache, unspecified: Secondary | ICD-10-CM | POA: Diagnosis not present

## 2021-07-29 ENCOUNTER — Other Ambulatory Visit: Payer: Self-pay

## 2021-07-29 ENCOUNTER — Encounter: Payer: Self-pay | Admitting: Vascular Surgery

## 2021-07-29 ENCOUNTER — Telehealth: Payer: Self-pay

## 2021-07-29 ENCOUNTER — Ambulatory Visit (INDEPENDENT_AMBULATORY_CARE_PROVIDER_SITE_OTHER): Payer: Medicare Other | Admitting: Vascular Surgery

## 2021-07-29 VITALS — BP 138/74 | HR 70 | Temp 97.9°F | Resp 20 | Ht 60.0 in | Wt 196.0 lb

## 2021-07-29 DIAGNOSIS — N186 End stage renal disease: Secondary | ICD-10-CM

## 2021-07-29 NOTE — Telephone Encounter (Signed)
Patient is s/p right arm AVG insertion on 7/20 by [facility] - was seen in clinic by PA on 7/25 per triage call - she had copious amount of drainage and was placed on clindamycin and advised to have a short interval follow up. They call today reporting that she has finished her course of antibiotics. She has no fever, chills, redness or drainage. Still endorses a little numbness. They would like to be seen today just to be cleared and assured nothing to worry about. Placed on BCC schedule at end of day.

## 2021-07-29 NOTE — Progress Notes (Signed)
     Subjective:     Patient ID: Katie Garcia, female   DOB: 1941/08/25, 80 y.o.   MRN: [id number]  HPI 80 year old female recent underwent right arm brachial artery to axillary vein AV graft.  She continues to dialyze via catheter.  She does have some coolness to her right hand.  She was seen for swelling of her right upper extremity this is mostly resolving.  She denies fevers or chills.   Review of Systems Coolness right hand    Objective:   Physical Exam Vitals:   07/29/21 1450  BP: 138/74  Pulse: 70  Resp: 20  Temp: 97.9 F (36.6 C)  SpO2: 95%   Awake alert oriented Nonlabored respirations Right hand is somewhat cooler to touch than left but his sensation and motor intact Right upper arm with strong thrill in graft    Assessment:     80 year old female status post right arm brachial artery to axillary vein AV graft.  She has some coolness to her right hand no tissue loss no numbness no tingling overall progressing well.    Plan:   Instructed her to use a squeeze ball for her hand.  Okay to use graft in 2 weeks  Follow-up as needed.  Josefina Rynders C. Donzetta Matters, MD [facility] Office: [phone] Pager: [phone]

## 2021-07-30 DIAGNOSIS — T8249XA Other complication of vascular dialysis catheter, initial encounter: Secondary | ICD-10-CM | POA: Diagnosis not present

## 2021-07-30 DIAGNOSIS — D631 Anemia in chronic kidney disease: Secondary | ICD-10-CM | POA: Diagnosis not present

## 2021-07-30 DIAGNOSIS — D509 Iron deficiency anemia, unspecified: Secondary | ICD-10-CM | POA: Diagnosis not present

## 2021-07-30 DIAGNOSIS — N186 End stage renal disease: Secondary | ICD-10-CM | POA: Diagnosis not present

## 2021-07-30 DIAGNOSIS — D688 Other specified coagulation defects: Secondary | ICD-10-CM | POA: Diagnosis not present

## 2021-07-30 DIAGNOSIS — Z992 Dependence on renal dialysis: Secondary | ICD-10-CM | POA: Diagnosis not present

## 2021-07-30 DIAGNOSIS — N2581 Secondary hyperparathyroidism of renal origin: Secondary | ICD-10-CM | POA: Diagnosis not present

## 2021-07-30 DIAGNOSIS — E1129 Type 2 diabetes mellitus with other diabetic kidney complication: Secondary | ICD-10-CM | POA: Diagnosis not present

## 2021-07-30 DIAGNOSIS — R519 Headache, unspecified: Secondary | ICD-10-CM | POA: Diagnosis not present

## 2021-08-02 DIAGNOSIS — T8249XA Other complication of vascular dialysis catheter, initial encounter: Secondary | ICD-10-CM | POA: Diagnosis not present

## 2021-08-02 DIAGNOSIS — Z992 Dependence on renal dialysis: Secondary | ICD-10-CM | POA: Diagnosis not present

## 2021-08-02 DIAGNOSIS — N186 End stage renal disease: Secondary | ICD-10-CM | POA: Diagnosis not present

## 2021-08-02 DIAGNOSIS — N2581 Secondary hyperparathyroidism of renal origin: Secondary | ICD-10-CM | POA: Diagnosis not present

## 2021-08-02 DIAGNOSIS — D631 Anemia in chronic kidney disease: Secondary | ICD-10-CM | POA: Diagnosis not present

## 2021-08-02 DIAGNOSIS — D509 Iron deficiency anemia, unspecified: Secondary | ICD-10-CM | POA: Diagnosis not present

## 2021-08-02 DIAGNOSIS — D688 Other specified coagulation defects: Secondary | ICD-10-CM | POA: Diagnosis not present

## 2021-08-02 DIAGNOSIS — E1129 Type 2 diabetes mellitus with other diabetic kidney complication: Secondary | ICD-10-CM | POA: Diagnosis not present

## 2021-08-02 DIAGNOSIS — R519 Headache, unspecified: Secondary | ICD-10-CM | POA: Diagnosis not present

## 2021-08-04 DIAGNOSIS — D509 Iron deficiency anemia, unspecified: Secondary | ICD-10-CM | POA: Diagnosis not present

## 2021-08-04 DIAGNOSIS — D631 Anemia in chronic kidney disease: Secondary | ICD-10-CM | POA: Diagnosis not present

## 2021-08-04 DIAGNOSIS — E1129 Type 2 diabetes mellitus with other diabetic kidney complication: Secondary | ICD-10-CM | POA: Diagnosis not present

## 2021-08-04 DIAGNOSIS — R519 Headache, unspecified: Secondary | ICD-10-CM | POA: Diagnosis not present

## 2021-08-04 DIAGNOSIS — D688 Other specified coagulation defects: Secondary | ICD-10-CM | POA: Diagnosis not present

## 2021-08-04 DIAGNOSIS — T8249XA Other complication of vascular dialysis catheter, initial encounter: Secondary | ICD-10-CM | POA: Diagnosis not present

## 2021-08-04 DIAGNOSIS — N2581 Secondary hyperparathyroidism of renal origin: Secondary | ICD-10-CM | POA: Diagnosis not present

## 2021-08-04 DIAGNOSIS — Z992 Dependence on renal dialysis: Secondary | ICD-10-CM | POA: Diagnosis not present

## 2021-08-04 DIAGNOSIS — N186 End stage renal disease: Secondary | ICD-10-CM | POA: Diagnosis not present

## 2021-08-06 DIAGNOSIS — D631 Anemia in chronic kidney disease: Secondary | ICD-10-CM | POA: Diagnosis not present

## 2021-08-06 DIAGNOSIS — R519 Headache, unspecified: Secondary | ICD-10-CM | POA: Diagnosis not present

## 2021-08-06 DIAGNOSIS — N2581 Secondary hyperparathyroidism of renal origin: Secondary | ICD-10-CM | POA: Diagnosis not present

## 2021-08-06 DIAGNOSIS — D509 Iron deficiency anemia, unspecified: Secondary | ICD-10-CM | POA: Diagnosis not present

## 2021-08-06 DIAGNOSIS — T8249XA Other complication of vascular dialysis catheter, initial encounter: Secondary | ICD-10-CM | POA: Diagnosis not present

## 2021-08-06 DIAGNOSIS — N186 End stage renal disease: Secondary | ICD-10-CM | POA: Diagnosis not present

## 2021-08-06 DIAGNOSIS — E1129 Type 2 diabetes mellitus with other diabetic kidney complication: Secondary | ICD-10-CM | POA: Diagnosis not present

## 2021-08-06 DIAGNOSIS — D688 Other specified coagulation defects: Secondary | ICD-10-CM | POA: Diagnosis not present

## 2021-08-06 DIAGNOSIS — Z992 Dependence on renal dialysis: Secondary | ICD-10-CM | POA: Diagnosis not present

## 2021-08-09 DIAGNOSIS — D509 Iron deficiency anemia, unspecified: Secondary | ICD-10-CM | POA: Diagnosis not present

## 2021-08-09 DIAGNOSIS — N186 End stage renal disease: Secondary | ICD-10-CM | POA: Diagnosis not present

## 2021-08-09 DIAGNOSIS — N2581 Secondary hyperparathyroidism of renal origin: Secondary | ICD-10-CM | POA: Diagnosis not present

## 2021-08-09 DIAGNOSIS — T8249XA Other complication of vascular dialysis catheter, initial encounter: Secondary | ICD-10-CM | POA: Diagnosis not present

## 2021-08-09 DIAGNOSIS — Z992 Dependence on renal dialysis: Secondary | ICD-10-CM | POA: Diagnosis not present

## 2021-08-09 DIAGNOSIS — D631 Anemia in chronic kidney disease: Secondary | ICD-10-CM | POA: Diagnosis not present

## 2021-08-09 DIAGNOSIS — D688 Other specified coagulation defects: Secondary | ICD-10-CM | POA: Diagnosis not present

## 2021-08-09 DIAGNOSIS — E1129 Type 2 diabetes mellitus with other diabetic kidney complication: Secondary | ICD-10-CM | POA: Diagnosis not present

## 2021-08-09 DIAGNOSIS — R519 Headache, unspecified: Secondary | ICD-10-CM | POA: Diagnosis not present

## 2021-08-11 DIAGNOSIS — Z992 Dependence on renal dialysis: Secondary | ICD-10-CM | POA: Diagnosis not present

## 2021-08-11 DIAGNOSIS — N186 End stage renal disease: Secondary | ICD-10-CM | POA: Diagnosis not present

## 2021-08-11 DIAGNOSIS — N2581 Secondary hyperparathyroidism of renal origin: Secondary | ICD-10-CM | POA: Diagnosis not present

## 2021-08-11 DIAGNOSIS — T8249XA Other complication of vascular dialysis catheter, initial encounter: Secondary | ICD-10-CM | POA: Diagnosis not present

## 2021-08-11 DIAGNOSIS — D631 Anemia in chronic kidney disease: Secondary | ICD-10-CM | POA: Diagnosis not present

## 2021-08-11 DIAGNOSIS — R519 Headache, unspecified: Secondary | ICD-10-CM | POA: Diagnosis not present

## 2021-08-11 DIAGNOSIS — E1129 Type 2 diabetes mellitus with other diabetic kidney complication: Secondary | ICD-10-CM | POA: Diagnosis not present

## 2021-08-11 DIAGNOSIS — D688 Other specified coagulation defects: Secondary | ICD-10-CM | POA: Diagnosis not present

## 2021-08-11 DIAGNOSIS — D509 Iron deficiency anemia, unspecified: Secondary | ICD-10-CM | POA: Diagnosis not present

## 2021-08-13 DIAGNOSIS — T8249XA Other complication of vascular dialysis catheter, initial encounter: Secondary | ICD-10-CM | POA: Diagnosis not present

## 2021-08-13 DIAGNOSIS — Z992 Dependence on renal dialysis: Secondary | ICD-10-CM | POA: Diagnosis not present

## 2021-08-13 DIAGNOSIS — R519 Headache, unspecified: Secondary | ICD-10-CM | POA: Diagnosis not present

## 2021-08-13 DIAGNOSIS — N186 End stage renal disease: Secondary | ICD-10-CM | POA: Diagnosis not present

## 2021-08-13 DIAGNOSIS — N2581 Secondary hyperparathyroidism of renal origin: Secondary | ICD-10-CM | POA: Diagnosis not present

## 2021-08-13 DIAGNOSIS — D688 Other specified coagulation defects: Secondary | ICD-10-CM | POA: Diagnosis not present

## 2021-08-13 DIAGNOSIS — D631 Anemia in chronic kidney disease: Secondary | ICD-10-CM | POA: Diagnosis not present

## 2021-08-13 DIAGNOSIS — E1129 Type 2 diabetes mellitus with other diabetic kidney complication: Secondary | ICD-10-CM | POA: Diagnosis not present

## 2021-08-13 DIAGNOSIS — D509 Iron deficiency anemia, unspecified: Secondary | ICD-10-CM | POA: Diagnosis not present

## 2021-08-16 DIAGNOSIS — D631 Anemia in chronic kidney disease: Secondary | ICD-10-CM | POA: Diagnosis not present

## 2021-08-16 DIAGNOSIS — E1129 Type 2 diabetes mellitus with other diabetic kidney complication: Secondary | ICD-10-CM | POA: Diagnosis not present

## 2021-08-16 DIAGNOSIS — D509 Iron deficiency anemia, unspecified: Secondary | ICD-10-CM | POA: Diagnosis not present

## 2021-08-16 DIAGNOSIS — R519 Headache, unspecified: Secondary | ICD-10-CM | POA: Diagnosis not present

## 2021-08-16 DIAGNOSIS — N2581 Secondary hyperparathyroidism of renal origin: Secondary | ICD-10-CM | POA: Diagnosis not present

## 2021-08-16 DIAGNOSIS — Z992 Dependence on renal dialysis: Secondary | ICD-10-CM | POA: Diagnosis not present

## 2021-08-16 DIAGNOSIS — D688 Other specified coagulation defects: Secondary | ICD-10-CM | POA: Diagnosis not present

## 2021-08-16 DIAGNOSIS — T8249XA Other complication of vascular dialysis catheter, initial encounter: Secondary | ICD-10-CM | POA: Diagnosis not present

## 2021-08-16 DIAGNOSIS — N186 End stage renal disease: Secondary | ICD-10-CM | POA: Diagnosis not present

## 2021-08-18 DIAGNOSIS — N2581 Secondary hyperparathyroidism of renal origin: Secondary | ICD-10-CM | POA: Diagnosis not present

## 2021-08-18 DIAGNOSIS — T8249XA Other complication of vascular dialysis catheter, initial encounter: Secondary | ICD-10-CM | POA: Diagnosis not present

## 2021-08-18 DIAGNOSIS — D688 Other specified coagulation defects: Secondary | ICD-10-CM | POA: Diagnosis not present

## 2021-08-18 DIAGNOSIS — D509 Iron deficiency anemia, unspecified: Secondary | ICD-10-CM | POA: Diagnosis not present

## 2021-08-18 DIAGNOSIS — D631 Anemia in chronic kidney disease: Secondary | ICD-10-CM | POA: Diagnosis not present

## 2021-08-18 DIAGNOSIS — Z992 Dependence on renal dialysis: Secondary | ICD-10-CM | POA: Diagnosis not present

## 2021-08-18 DIAGNOSIS — E1129 Type 2 diabetes mellitus with other diabetic kidney complication: Secondary | ICD-10-CM | POA: Diagnosis not present

## 2021-08-18 DIAGNOSIS — R519 Headache, unspecified: Secondary | ICD-10-CM | POA: Diagnosis not present

## 2021-08-18 DIAGNOSIS — N186 End stage renal disease: Secondary | ICD-10-CM | POA: Diagnosis not present

## 2021-08-20 DIAGNOSIS — Z992 Dependence on renal dialysis: Secondary | ICD-10-CM | POA: Diagnosis not present

## 2021-08-20 DIAGNOSIS — N2581 Secondary hyperparathyroidism of renal origin: Secondary | ICD-10-CM | POA: Diagnosis not present

## 2021-08-20 DIAGNOSIS — T8249XA Other complication of vascular dialysis catheter, initial encounter: Secondary | ICD-10-CM | POA: Diagnosis not present

## 2021-08-20 DIAGNOSIS — D688 Other specified coagulation defects: Secondary | ICD-10-CM | POA: Diagnosis not present

## 2021-08-20 DIAGNOSIS — R519 Headache, unspecified: Secondary | ICD-10-CM | POA: Diagnosis not present

## 2021-08-20 DIAGNOSIS — E1129 Type 2 diabetes mellitus with other diabetic kidney complication: Secondary | ICD-10-CM | POA: Diagnosis not present

## 2021-08-20 DIAGNOSIS — D509 Iron deficiency anemia, unspecified: Secondary | ICD-10-CM | POA: Diagnosis not present

## 2021-08-20 DIAGNOSIS — D631 Anemia in chronic kidney disease: Secondary | ICD-10-CM | POA: Diagnosis not present

## 2021-08-20 DIAGNOSIS — N186 End stage renal disease: Secondary | ICD-10-CM | POA: Diagnosis not present

## 2021-08-23 DIAGNOSIS — D631 Anemia in chronic kidney disease: Secondary | ICD-10-CM | POA: Diagnosis not present

## 2021-08-23 DIAGNOSIS — D509 Iron deficiency anemia, unspecified: Secondary | ICD-10-CM | POA: Diagnosis not present

## 2021-08-23 DIAGNOSIS — R519 Headache, unspecified: Secondary | ICD-10-CM | POA: Diagnosis not present

## 2021-08-23 DIAGNOSIS — D688 Other specified coagulation defects: Secondary | ICD-10-CM | POA: Diagnosis not present

## 2021-08-23 DIAGNOSIS — Z992 Dependence on renal dialysis: Secondary | ICD-10-CM | POA: Diagnosis not present

## 2021-08-23 DIAGNOSIS — N186 End stage renal disease: Secondary | ICD-10-CM | POA: Diagnosis not present

## 2021-08-23 DIAGNOSIS — E1129 Type 2 diabetes mellitus with other diabetic kidney complication: Secondary | ICD-10-CM | POA: Diagnosis not present

## 2021-08-23 DIAGNOSIS — N2581 Secondary hyperparathyroidism of renal origin: Secondary | ICD-10-CM | POA: Diagnosis not present

## 2021-08-23 DIAGNOSIS — T8249XA Other complication of vascular dialysis catheter, initial encounter: Secondary | ICD-10-CM | POA: Diagnosis not present

## 2021-08-24 DIAGNOSIS — Z992 Dependence on renal dialysis: Secondary | ICD-10-CM | POA: Diagnosis not present

## 2021-08-24 DIAGNOSIS — E1122 Type 2 diabetes mellitus with diabetic chronic kidney disease: Secondary | ICD-10-CM | POA: Diagnosis not present

## 2021-08-24 DIAGNOSIS — N186 End stage renal disease: Secondary | ICD-10-CM | POA: Diagnosis not present

## 2021-08-25 DIAGNOSIS — D631 Anemia in chronic kidney disease: Secondary | ICD-10-CM | POA: Diagnosis not present

## 2021-08-25 DIAGNOSIS — Z992 Dependence on renal dialysis: Secondary | ICD-10-CM | POA: Diagnosis not present

## 2021-08-25 DIAGNOSIS — D509 Iron deficiency anemia, unspecified: Secondary | ICD-10-CM | POA: Diagnosis not present

## 2021-08-25 DIAGNOSIS — N186 End stage renal disease: Secondary | ICD-10-CM | POA: Diagnosis not present

## 2021-08-25 DIAGNOSIS — E1129 Type 2 diabetes mellitus with other diabetic kidney complication: Secondary | ICD-10-CM | POA: Diagnosis not present

## 2021-08-25 DIAGNOSIS — N2581 Secondary hyperparathyroidism of renal origin: Secondary | ICD-10-CM | POA: Diagnosis not present

## 2021-08-25 DIAGNOSIS — D688 Other specified coagulation defects: Secondary | ICD-10-CM | POA: Diagnosis not present

## 2021-08-25 DIAGNOSIS — R519 Headache, unspecified: Secondary | ICD-10-CM | POA: Diagnosis not present

## 2021-08-25 DIAGNOSIS — T8249XA Other complication of vascular dialysis catheter, initial encounter: Secondary | ICD-10-CM | POA: Diagnosis not present

## 2021-08-27 DIAGNOSIS — Z992 Dependence on renal dialysis: Secondary | ICD-10-CM | POA: Diagnosis not present

## 2021-08-27 DIAGNOSIS — N2581 Secondary hyperparathyroidism of renal origin: Secondary | ICD-10-CM | POA: Diagnosis not present

## 2021-08-27 DIAGNOSIS — R519 Headache, unspecified: Secondary | ICD-10-CM | POA: Diagnosis not present

## 2021-08-27 DIAGNOSIS — T8249XA Other complication of vascular dialysis catheter, initial encounter: Secondary | ICD-10-CM | POA: Diagnosis not present

## 2021-08-27 DIAGNOSIS — D509 Iron deficiency anemia, unspecified: Secondary | ICD-10-CM | POA: Diagnosis not present

## 2021-08-27 DIAGNOSIS — D688 Other specified coagulation defects: Secondary | ICD-10-CM | POA: Diagnosis not present

## 2021-08-27 DIAGNOSIS — N186 End stage renal disease: Secondary | ICD-10-CM | POA: Diagnosis not present

## 2021-08-27 DIAGNOSIS — D631 Anemia in chronic kidney disease: Secondary | ICD-10-CM | POA: Diagnosis not present

## 2021-08-27 DIAGNOSIS — E1129 Type 2 diabetes mellitus with other diabetic kidney complication: Secondary | ICD-10-CM | POA: Diagnosis not present

## 2021-08-30 DIAGNOSIS — D509 Iron deficiency anemia, unspecified: Secondary | ICD-10-CM | POA: Diagnosis not present

## 2021-08-30 DIAGNOSIS — D631 Anemia in chronic kidney disease: Secondary | ICD-10-CM | POA: Diagnosis not present

## 2021-08-30 DIAGNOSIS — R519 Headache, unspecified: Secondary | ICD-10-CM | POA: Diagnosis not present

## 2021-08-30 DIAGNOSIS — N186 End stage renal disease: Secondary | ICD-10-CM | POA: Diagnosis not present

## 2021-08-30 DIAGNOSIS — T8249XA Other complication of vascular dialysis catheter, initial encounter: Secondary | ICD-10-CM | POA: Diagnosis not present

## 2021-08-30 DIAGNOSIS — E1129 Type 2 diabetes mellitus with other diabetic kidney complication: Secondary | ICD-10-CM | POA: Diagnosis not present

## 2021-08-30 DIAGNOSIS — N2581 Secondary hyperparathyroidism of renal origin: Secondary | ICD-10-CM | POA: Diagnosis not present

## 2021-08-30 DIAGNOSIS — D688 Other specified coagulation defects: Secondary | ICD-10-CM | POA: Diagnosis not present

## 2021-08-30 DIAGNOSIS — Z992 Dependence on renal dialysis: Secondary | ICD-10-CM | POA: Diagnosis not present

## 2021-09-01 DIAGNOSIS — R519 Headache, unspecified: Secondary | ICD-10-CM | POA: Diagnosis not present

## 2021-09-01 DIAGNOSIS — N2581 Secondary hyperparathyroidism of renal origin: Secondary | ICD-10-CM | POA: Diagnosis not present

## 2021-09-01 DIAGNOSIS — Z992 Dependence on renal dialysis: Secondary | ICD-10-CM | POA: Diagnosis not present

## 2021-09-01 DIAGNOSIS — E1129 Type 2 diabetes mellitus with other diabetic kidney complication: Secondary | ICD-10-CM | POA: Diagnosis not present

## 2021-09-01 DIAGNOSIS — D631 Anemia in chronic kidney disease: Secondary | ICD-10-CM | POA: Diagnosis not present

## 2021-09-01 DIAGNOSIS — D688 Other specified coagulation defects: Secondary | ICD-10-CM | POA: Diagnosis not present

## 2021-09-01 DIAGNOSIS — D509 Iron deficiency anemia, unspecified: Secondary | ICD-10-CM | POA: Diagnosis not present

## 2021-09-01 DIAGNOSIS — N186 End stage renal disease: Secondary | ICD-10-CM | POA: Diagnosis not present

## 2021-09-01 DIAGNOSIS — T8249XA Other complication of vascular dialysis catheter, initial encounter: Secondary | ICD-10-CM | POA: Diagnosis not present

## 2021-09-03 DIAGNOSIS — T8249XA Other complication of vascular dialysis catheter, initial encounter: Secondary | ICD-10-CM | POA: Diagnosis not present

## 2021-09-03 DIAGNOSIS — D688 Other specified coagulation defects: Secondary | ICD-10-CM | POA: Diagnosis not present

## 2021-09-03 DIAGNOSIS — E1129 Type 2 diabetes mellitus with other diabetic kidney complication: Secondary | ICD-10-CM | POA: Diagnosis not present

## 2021-09-03 DIAGNOSIS — N2581 Secondary hyperparathyroidism of renal origin: Secondary | ICD-10-CM | POA: Diagnosis not present

## 2021-09-03 DIAGNOSIS — Z992 Dependence on renal dialysis: Secondary | ICD-10-CM | POA: Diagnosis not present

## 2021-09-03 DIAGNOSIS — D509 Iron deficiency anemia, unspecified: Secondary | ICD-10-CM | POA: Diagnosis not present

## 2021-09-03 DIAGNOSIS — D631 Anemia in chronic kidney disease: Secondary | ICD-10-CM | POA: Diagnosis not present

## 2021-09-03 DIAGNOSIS — N186 End stage renal disease: Secondary | ICD-10-CM | POA: Diagnosis not present

## 2021-09-03 DIAGNOSIS — R519 Headache, unspecified: Secondary | ICD-10-CM | POA: Diagnosis not present

## 2021-09-05 ENCOUNTER — Ambulatory Visit: Payer: Medicare Other | Admitting: Podiatry

## 2021-09-05 ENCOUNTER — Other Ambulatory Visit: Payer: Self-pay

## 2021-09-05 DIAGNOSIS — M79675 Pain in left toe(s): Secondary | ICD-10-CM

## 2021-09-05 DIAGNOSIS — M79674 Pain in right toe(s): Secondary | ICD-10-CM | POA: Diagnosis not present

## 2021-09-05 DIAGNOSIS — E119 Type 2 diabetes mellitus without complications: Secondary | ICD-10-CM

## 2021-09-05 DIAGNOSIS — E0843 Diabetes mellitus due to underlying condition with diabetic autonomic (poly)neuropathy: Secondary | ICD-10-CM | POA: Diagnosis not present

## 2021-09-05 DIAGNOSIS — B351 Tinea unguium: Secondary | ICD-10-CM

## 2021-09-05 NOTE — Progress Notes (Signed)
   HPI: 80 y.o. female presenting today for routine foot care.  Patient states that her toenails are not that bad and she does not want her nails trimmed today.  Patient is diabetic without complication.  She presents for routine evaluation  Past Medical History:  Diagnosis Date   Anemia    Arthritis    Dementia ([place])    Diabetes mellitus without complication ([place])    ESRD    Dialysis T/Th/Sa at [place]   GERD (gastroesophageal reflux disease)    Gout    Headache    Hypertension    Type 2 diabetes mellitus ([place])      Physical Exam: General: The patient is alert and oriented x3 in no acute distress.  Dermatology: Skin is warm, dry and supple bilateral lower extremities. Negative for open lesions or macerations.  Vascular: Palpable pedal pulses bilaterally. No edema or erythema noted. Capillary refill within normal limits.  Neurological: Epicritic and protective threshold grossly intact bilaterally.   Musculoskeletal Exam: No pedal deformity noted   Assessment: 1.  Diabetes mellitus; uncomplicated   Plan of Care:  1. Patient evaluated.  2.  Patient states that she does not need her routine foot care performed today. 3.  Diabetic foot exam performed today.  No complications noted 4.  Continue wearing good supportive shoes 5.  Return to clinic as needed with the patient needs routine foot care or for her routine diabetic foot exam      Edrick Kins, DPM [facility]  Dr. Edrick Kins, DPM    [address], [place] [address]                Office [phone]  Fax [phone]

## 2021-09-06 DIAGNOSIS — N186 End stage renal disease: Secondary | ICD-10-CM | POA: Diagnosis not present

## 2021-09-06 DIAGNOSIS — D631 Anemia in chronic kidney disease: Secondary | ICD-10-CM | POA: Diagnosis not present

## 2021-09-06 DIAGNOSIS — D509 Iron deficiency anemia, unspecified: Secondary | ICD-10-CM | POA: Diagnosis not present

## 2021-09-06 DIAGNOSIS — T8249XA Other complication of vascular dialysis catheter, initial encounter: Secondary | ICD-10-CM | POA: Diagnosis not present

## 2021-09-06 DIAGNOSIS — N2581 Secondary hyperparathyroidism of renal origin: Secondary | ICD-10-CM | POA: Diagnosis not present

## 2021-09-06 DIAGNOSIS — Z992 Dependence on renal dialysis: Secondary | ICD-10-CM | POA: Diagnosis not present

## 2021-09-06 DIAGNOSIS — E1129 Type 2 diabetes mellitus with other diabetic kidney complication: Secondary | ICD-10-CM | POA: Diagnosis not present

## 2021-09-06 DIAGNOSIS — D688 Other specified coagulation defects: Secondary | ICD-10-CM | POA: Diagnosis not present

## 2021-09-06 DIAGNOSIS — R519 Headache, unspecified: Secondary | ICD-10-CM | POA: Diagnosis not present

## 2021-09-08 DIAGNOSIS — I129 Hypertensive chronic kidney disease with stage 1 through stage 4 chronic kidney disease, or unspecified chronic kidney disease: Secondary | ICD-10-CM | POA: Diagnosis not present

## 2021-09-08 DIAGNOSIS — E785 Hyperlipidemia, unspecified: Secondary | ICD-10-CM | POA: Diagnosis not present

## 2021-09-08 DIAGNOSIS — E1129 Type 2 diabetes mellitus with other diabetic kidney complication: Secondary | ICD-10-CM | POA: Diagnosis not present

## 2021-09-08 DIAGNOSIS — D509 Iron deficiency anemia, unspecified: Secondary | ICD-10-CM | POA: Diagnosis not present

## 2021-09-08 DIAGNOSIS — T8249XA Other complication of vascular dialysis catheter, initial encounter: Secondary | ICD-10-CM | POA: Diagnosis not present

## 2021-09-08 DIAGNOSIS — D631 Anemia in chronic kidney disease: Secondary | ICD-10-CM | POA: Diagnosis not present

## 2021-09-08 DIAGNOSIS — N186 End stage renal disease: Secondary | ICD-10-CM | POA: Diagnosis not present

## 2021-09-08 DIAGNOSIS — E1122 Type 2 diabetes mellitus with diabetic chronic kidney disease: Secondary | ICD-10-CM | POA: Diagnosis not present

## 2021-09-08 DIAGNOSIS — R519 Headache, unspecified: Secondary | ICD-10-CM | POA: Diagnosis not present

## 2021-09-08 DIAGNOSIS — I10 Essential (primary) hypertension: Secondary | ICD-10-CM | POA: Diagnosis not present

## 2021-09-08 DIAGNOSIS — K219 Gastro-esophageal reflux disease without esophagitis: Secondary | ICD-10-CM | POA: Diagnosis not present

## 2021-09-08 DIAGNOSIS — J45901 Unspecified asthma with (acute) exacerbation: Secondary | ICD-10-CM | POA: Diagnosis not present

## 2021-09-08 DIAGNOSIS — N185 Chronic kidney disease, stage 5: Secondary | ICD-10-CM | POA: Diagnosis not present

## 2021-09-08 DIAGNOSIS — N2581 Secondary hyperparathyroidism of renal origin: Secondary | ICD-10-CM | POA: Diagnosis not present

## 2021-09-08 DIAGNOSIS — J45998 Other asthma: Secondary | ICD-10-CM | POA: Diagnosis not present

## 2021-09-08 DIAGNOSIS — D688 Other specified coagulation defects: Secondary | ICD-10-CM | POA: Diagnosis not present

## 2021-09-08 DIAGNOSIS — Z992 Dependence on renal dialysis: Secondary | ICD-10-CM | POA: Diagnosis not present

## 2021-09-10 DIAGNOSIS — D509 Iron deficiency anemia, unspecified: Secondary | ICD-10-CM | POA: Diagnosis not present

## 2021-09-10 DIAGNOSIS — N2581 Secondary hyperparathyroidism of renal origin: Secondary | ICD-10-CM | POA: Diagnosis not present

## 2021-09-10 DIAGNOSIS — D688 Other specified coagulation defects: Secondary | ICD-10-CM | POA: Diagnosis not present

## 2021-09-10 DIAGNOSIS — N186 End stage renal disease: Secondary | ICD-10-CM | POA: Diagnosis not present

## 2021-09-10 DIAGNOSIS — R519 Headache, unspecified: Secondary | ICD-10-CM | POA: Diagnosis not present

## 2021-09-10 DIAGNOSIS — D631 Anemia in chronic kidney disease: Secondary | ICD-10-CM | POA: Diagnosis not present

## 2021-09-10 DIAGNOSIS — E1129 Type 2 diabetes mellitus with other diabetic kidney complication: Secondary | ICD-10-CM | POA: Diagnosis not present

## 2021-09-10 DIAGNOSIS — T8249XA Other complication of vascular dialysis catheter, initial encounter: Secondary | ICD-10-CM | POA: Diagnosis not present

## 2021-09-10 DIAGNOSIS — Z992 Dependence on renal dialysis: Secondary | ICD-10-CM | POA: Diagnosis not present

## 2021-09-13 DIAGNOSIS — N2581 Secondary hyperparathyroidism of renal origin: Secondary | ICD-10-CM | POA: Diagnosis not present

## 2021-09-13 DIAGNOSIS — T8249XA Other complication of vascular dialysis catheter, initial encounter: Secondary | ICD-10-CM | POA: Diagnosis not present

## 2021-09-13 DIAGNOSIS — E1129 Type 2 diabetes mellitus with other diabetic kidney complication: Secondary | ICD-10-CM | POA: Diagnosis not present

## 2021-09-13 DIAGNOSIS — N186 End stage renal disease: Secondary | ICD-10-CM | POA: Diagnosis not present

## 2021-09-13 DIAGNOSIS — R519 Headache, unspecified: Secondary | ICD-10-CM | POA: Diagnosis not present

## 2021-09-13 DIAGNOSIS — Z992 Dependence on renal dialysis: Secondary | ICD-10-CM | POA: Diagnosis not present

## 2021-09-13 DIAGNOSIS — D631 Anemia in chronic kidney disease: Secondary | ICD-10-CM | POA: Diagnosis not present

## 2021-09-13 DIAGNOSIS — D509 Iron deficiency anemia, unspecified: Secondary | ICD-10-CM | POA: Diagnosis not present

## 2021-09-13 DIAGNOSIS — D688 Other specified coagulation defects: Secondary | ICD-10-CM | POA: Diagnosis not present

## 2021-09-14 DIAGNOSIS — Z992 Dependence on renal dialysis: Secondary | ICD-10-CM | POA: Diagnosis not present

## 2021-09-14 DIAGNOSIS — N186 End stage renal disease: Secondary | ICD-10-CM | POA: Diagnosis not present

## 2021-09-14 DIAGNOSIS — Z452 Encounter for adjustment and management of vascular access device: Secondary | ICD-10-CM | POA: Diagnosis not present

## 2021-09-15 DIAGNOSIS — T8249XA Other complication of vascular dialysis catheter, initial encounter: Secondary | ICD-10-CM | POA: Diagnosis not present

## 2021-09-15 DIAGNOSIS — R519 Headache, unspecified: Secondary | ICD-10-CM | POA: Diagnosis not present

## 2021-09-15 DIAGNOSIS — E1129 Type 2 diabetes mellitus with other diabetic kidney complication: Secondary | ICD-10-CM | POA: Diagnosis not present

## 2021-09-15 DIAGNOSIS — N186 End stage renal disease: Secondary | ICD-10-CM | POA: Diagnosis not present

## 2021-09-15 DIAGNOSIS — D509 Iron deficiency anemia, unspecified: Secondary | ICD-10-CM | POA: Diagnosis not present

## 2021-09-15 DIAGNOSIS — D688 Other specified coagulation defects: Secondary | ICD-10-CM | POA: Diagnosis not present

## 2021-09-15 DIAGNOSIS — Z992 Dependence on renal dialysis: Secondary | ICD-10-CM | POA: Diagnosis not present

## 2021-09-15 DIAGNOSIS — N2581 Secondary hyperparathyroidism of renal origin: Secondary | ICD-10-CM | POA: Diagnosis not present

## 2021-09-15 DIAGNOSIS — D631 Anemia in chronic kidney disease: Secondary | ICD-10-CM | POA: Diagnosis not present

## 2021-09-17 DIAGNOSIS — D688 Other specified coagulation defects: Secondary | ICD-10-CM | POA: Diagnosis not present

## 2021-09-17 DIAGNOSIS — T8249XA Other complication of vascular dialysis catheter, initial encounter: Secondary | ICD-10-CM | POA: Diagnosis not present

## 2021-09-17 DIAGNOSIS — N186 End stage renal disease: Secondary | ICD-10-CM | POA: Diagnosis not present

## 2021-09-17 DIAGNOSIS — N2581 Secondary hyperparathyroidism of renal origin: Secondary | ICD-10-CM | POA: Diagnosis not present

## 2021-09-17 DIAGNOSIS — R519 Headache, unspecified: Secondary | ICD-10-CM | POA: Diagnosis not present

## 2021-09-17 DIAGNOSIS — D509 Iron deficiency anemia, unspecified: Secondary | ICD-10-CM | POA: Diagnosis not present

## 2021-09-17 DIAGNOSIS — E1129 Type 2 diabetes mellitus with other diabetic kidney complication: Secondary | ICD-10-CM | POA: Diagnosis not present

## 2021-09-17 DIAGNOSIS — Z992 Dependence on renal dialysis: Secondary | ICD-10-CM | POA: Diagnosis not present

## 2021-09-17 DIAGNOSIS — D631 Anemia in chronic kidney disease: Secondary | ICD-10-CM | POA: Diagnosis not present

## 2021-09-20 DIAGNOSIS — Z992 Dependence on renal dialysis: Secondary | ICD-10-CM | POA: Diagnosis not present

## 2021-09-20 DIAGNOSIS — D688 Other specified coagulation defects: Secondary | ICD-10-CM | POA: Diagnosis not present

## 2021-09-20 DIAGNOSIS — R519 Headache, unspecified: Secondary | ICD-10-CM | POA: Diagnosis not present

## 2021-09-20 DIAGNOSIS — D509 Iron deficiency anemia, unspecified: Secondary | ICD-10-CM | POA: Diagnosis not present

## 2021-09-20 DIAGNOSIS — N186 End stage renal disease: Secondary | ICD-10-CM | POA: Diagnosis not present

## 2021-09-20 DIAGNOSIS — D631 Anemia in chronic kidney disease: Secondary | ICD-10-CM | POA: Diagnosis not present

## 2021-09-20 DIAGNOSIS — E1129 Type 2 diabetes mellitus with other diabetic kidney complication: Secondary | ICD-10-CM | POA: Diagnosis not present

## 2021-09-20 DIAGNOSIS — T8249XA Other complication of vascular dialysis catheter, initial encounter: Secondary | ICD-10-CM | POA: Diagnosis not present

## 2021-09-20 DIAGNOSIS — N2581 Secondary hyperparathyroidism of renal origin: Secondary | ICD-10-CM | POA: Diagnosis not present

## 2021-09-22 DIAGNOSIS — E1129 Type 2 diabetes mellitus with other diabetic kidney complication: Secondary | ICD-10-CM | POA: Diagnosis not present

## 2021-09-22 DIAGNOSIS — N186 End stage renal disease: Secondary | ICD-10-CM | POA: Diagnosis not present

## 2021-09-22 DIAGNOSIS — N2581 Secondary hyperparathyroidism of renal origin: Secondary | ICD-10-CM | POA: Diagnosis not present

## 2021-09-22 DIAGNOSIS — T8249XA Other complication of vascular dialysis catheter, initial encounter: Secondary | ICD-10-CM | POA: Diagnosis not present

## 2021-09-22 DIAGNOSIS — R519 Headache, unspecified: Secondary | ICD-10-CM | POA: Diagnosis not present

## 2021-09-22 DIAGNOSIS — D631 Anemia in chronic kidney disease: Secondary | ICD-10-CM | POA: Diagnosis not present

## 2021-09-22 DIAGNOSIS — Z992 Dependence on renal dialysis: Secondary | ICD-10-CM | POA: Diagnosis not present

## 2021-09-22 DIAGNOSIS — D688 Other specified coagulation defects: Secondary | ICD-10-CM | POA: Diagnosis not present

## 2021-09-22 DIAGNOSIS — D509 Iron deficiency anemia, unspecified: Secondary | ICD-10-CM | POA: Diagnosis not present

## 2021-09-23 DIAGNOSIS — E1122 Type 2 diabetes mellitus with diabetic chronic kidney disease: Secondary | ICD-10-CM | POA: Diagnosis not present

## 2021-09-23 DIAGNOSIS — N186 End stage renal disease: Secondary | ICD-10-CM | POA: Diagnosis not present

## 2021-09-23 DIAGNOSIS — Z992 Dependence on renal dialysis: Secondary | ICD-10-CM | POA: Diagnosis not present

## 2021-09-24 DIAGNOSIS — E1129 Type 2 diabetes mellitus with other diabetic kidney complication: Secondary | ICD-10-CM | POA: Diagnosis not present

## 2021-09-24 DIAGNOSIS — Z992 Dependence on renal dialysis: Secondary | ICD-10-CM | POA: Diagnosis not present

## 2021-09-24 DIAGNOSIS — Z23 Encounter for immunization: Secondary | ICD-10-CM | POA: Diagnosis not present

## 2021-09-24 DIAGNOSIS — N186 End stage renal disease: Secondary | ICD-10-CM | POA: Diagnosis not present

## 2021-09-24 DIAGNOSIS — D688 Other specified coagulation defects: Secondary | ICD-10-CM | POA: Diagnosis not present

## 2021-09-24 DIAGNOSIS — R519 Headache, unspecified: Secondary | ICD-10-CM | POA: Diagnosis not present

## 2021-09-24 DIAGNOSIS — N2581 Secondary hyperparathyroidism of renal origin: Secondary | ICD-10-CM | POA: Diagnosis not present

## 2021-09-27 DIAGNOSIS — D688 Other specified coagulation defects: Secondary | ICD-10-CM | POA: Diagnosis not present

## 2021-09-27 DIAGNOSIS — Z23 Encounter for immunization: Secondary | ICD-10-CM | POA: Diagnosis not present

## 2021-09-27 DIAGNOSIS — Z992 Dependence on renal dialysis: Secondary | ICD-10-CM | POA: Diagnosis not present

## 2021-09-27 DIAGNOSIS — N2581 Secondary hyperparathyroidism of renal origin: Secondary | ICD-10-CM | POA: Diagnosis not present

## 2021-09-27 DIAGNOSIS — N186 End stage renal disease: Secondary | ICD-10-CM | POA: Diagnosis not present

## 2021-09-27 DIAGNOSIS — E1129 Type 2 diabetes mellitus with other diabetic kidney complication: Secondary | ICD-10-CM | POA: Diagnosis not present

## 2021-09-27 DIAGNOSIS — R519 Headache, unspecified: Secondary | ICD-10-CM | POA: Diagnosis not present

## 2021-09-29 DIAGNOSIS — D688 Other specified coagulation defects: Secondary | ICD-10-CM | POA: Diagnosis not present

## 2021-09-29 DIAGNOSIS — Z992 Dependence on renal dialysis: Secondary | ICD-10-CM | POA: Diagnosis not present

## 2021-09-29 DIAGNOSIS — E1129 Type 2 diabetes mellitus with other diabetic kidney complication: Secondary | ICD-10-CM | POA: Diagnosis not present

## 2021-09-29 DIAGNOSIS — Z23 Encounter for immunization: Secondary | ICD-10-CM | POA: Diagnosis not present

## 2021-09-29 DIAGNOSIS — N2581 Secondary hyperparathyroidism of renal origin: Secondary | ICD-10-CM | POA: Diagnosis not present

## 2021-09-29 DIAGNOSIS — N186 End stage renal disease: Secondary | ICD-10-CM | POA: Diagnosis not present

## 2021-09-29 DIAGNOSIS — R519 Headache, unspecified: Secondary | ICD-10-CM | POA: Diagnosis not present

## 2021-10-01 DIAGNOSIS — N186 End stage renal disease: Secondary | ICD-10-CM | POA: Diagnosis not present

## 2021-10-01 DIAGNOSIS — N2581 Secondary hyperparathyroidism of renal origin: Secondary | ICD-10-CM | POA: Diagnosis not present

## 2021-10-01 DIAGNOSIS — D688 Other specified coagulation defects: Secondary | ICD-10-CM | POA: Diagnosis not present

## 2021-10-01 DIAGNOSIS — Z992 Dependence on renal dialysis: Secondary | ICD-10-CM | POA: Diagnosis not present

## 2021-10-01 DIAGNOSIS — E1129 Type 2 diabetes mellitus with other diabetic kidney complication: Secondary | ICD-10-CM | POA: Diagnosis not present

## 2021-10-01 DIAGNOSIS — Z23 Encounter for immunization: Secondary | ICD-10-CM | POA: Diagnosis not present

## 2021-10-01 DIAGNOSIS — R519 Headache, unspecified: Secondary | ICD-10-CM | POA: Diagnosis not present

## 2021-10-04 DIAGNOSIS — E1129 Type 2 diabetes mellitus with other diabetic kidney complication: Secondary | ICD-10-CM | POA: Diagnosis not present

## 2021-10-04 DIAGNOSIS — Z992 Dependence on renal dialysis: Secondary | ICD-10-CM | POA: Diagnosis not present

## 2021-10-04 DIAGNOSIS — T82868A Thrombosis of vascular prosthetic devices, implants and grafts, initial encounter: Secondary | ICD-10-CM | POA: Diagnosis not present

## 2021-10-04 DIAGNOSIS — N186 End stage renal disease: Secondary | ICD-10-CM | POA: Diagnosis not present

## 2021-10-04 DIAGNOSIS — N2581 Secondary hyperparathyroidism of renal origin: Secondary | ICD-10-CM | POA: Diagnosis not present

## 2021-10-04 DIAGNOSIS — D688 Other specified coagulation defects: Secondary | ICD-10-CM | POA: Diagnosis not present

## 2021-10-04 DIAGNOSIS — Z23 Encounter for immunization: Secondary | ICD-10-CM | POA: Diagnosis not present

## 2021-10-04 DIAGNOSIS — I871 Compression of vein: Secondary | ICD-10-CM | POA: Diagnosis not present

## 2021-10-04 DIAGNOSIS — R519 Headache, unspecified: Secondary | ICD-10-CM | POA: Diagnosis not present

## 2021-10-06 DIAGNOSIS — Z992 Dependence on renal dialysis: Secondary | ICD-10-CM | POA: Diagnosis not present

## 2021-10-06 DIAGNOSIS — N2581 Secondary hyperparathyroidism of renal origin: Secondary | ICD-10-CM | POA: Diagnosis not present

## 2021-10-06 DIAGNOSIS — I871 Compression of vein: Secondary | ICD-10-CM | POA: Diagnosis not present

## 2021-10-06 DIAGNOSIS — E1129 Type 2 diabetes mellitus with other diabetic kidney complication: Secondary | ICD-10-CM | POA: Diagnosis not present

## 2021-10-06 DIAGNOSIS — N186 End stage renal disease: Secondary | ICD-10-CM | POA: Diagnosis not present

## 2021-10-06 DIAGNOSIS — Z23 Encounter for immunization: Secondary | ICD-10-CM | POA: Diagnosis not present

## 2021-10-06 DIAGNOSIS — T82868A Thrombosis of vascular prosthetic devices, implants and grafts, initial encounter: Secondary | ICD-10-CM | POA: Diagnosis not present

## 2021-10-06 DIAGNOSIS — R519 Headache, unspecified: Secondary | ICD-10-CM | POA: Diagnosis not present

## 2021-10-06 DIAGNOSIS — D688 Other specified coagulation defects: Secondary | ICD-10-CM | POA: Diagnosis not present

## 2021-10-08 DIAGNOSIS — E1129 Type 2 diabetes mellitus with other diabetic kidney complication: Secondary | ICD-10-CM | POA: Diagnosis not present

## 2021-10-08 DIAGNOSIS — R519 Headache, unspecified: Secondary | ICD-10-CM | POA: Diagnosis not present

## 2021-10-08 DIAGNOSIS — N186 End stage renal disease: Secondary | ICD-10-CM | POA: Diagnosis not present

## 2021-10-08 DIAGNOSIS — D688 Other specified coagulation defects: Secondary | ICD-10-CM | POA: Diagnosis not present

## 2021-10-08 DIAGNOSIS — Z23 Encounter for immunization: Secondary | ICD-10-CM | POA: Diagnosis not present

## 2021-10-08 DIAGNOSIS — N2581 Secondary hyperparathyroidism of renal origin: Secondary | ICD-10-CM | POA: Diagnosis not present

## 2021-10-08 DIAGNOSIS — Z992 Dependence on renal dialysis: Secondary | ICD-10-CM | POA: Diagnosis not present

## 2021-10-10 ENCOUNTER — Ambulatory Visit: Payer: Medicare Other | Admitting: Endocrinology

## 2021-10-11 DIAGNOSIS — R519 Headache, unspecified: Secondary | ICD-10-CM | POA: Diagnosis not present

## 2021-10-11 DIAGNOSIS — Z23 Encounter for immunization: Secondary | ICD-10-CM | POA: Diagnosis not present

## 2021-10-11 DIAGNOSIS — N2581 Secondary hyperparathyroidism of renal origin: Secondary | ICD-10-CM | POA: Diagnosis not present

## 2021-10-11 DIAGNOSIS — E1129 Type 2 diabetes mellitus with other diabetic kidney complication: Secondary | ICD-10-CM | POA: Diagnosis not present

## 2021-10-11 DIAGNOSIS — N186 End stage renal disease: Secondary | ICD-10-CM | POA: Diagnosis not present

## 2021-10-11 DIAGNOSIS — Z992 Dependence on renal dialysis: Secondary | ICD-10-CM | POA: Diagnosis not present

## 2021-10-11 DIAGNOSIS — D688 Other specified coagulation defects: Secondary | ICD-10-CM | POA: Diagnosis not present

## 2021-10-13 ENCOUNTER — Emergency Department (HOSPITAL_COMMUNITY): Payer: Medicare Other

## 2021-10-13 ENCOUNTER — Other Ambulatory Visit: Payer: Self-pay

## 2021-10-13 ENCOUNTER — Encounter (HOSPITAL_COMMUNITY): Payer: Self-pay | Admitting: Emergency Medicine

## 2021-10-13 ENCOUNTER — Emergency Department (HOSPITAL_COMMUNITY)
Admission: EM | Admit: 2021-10-13 | Discharge: 2021-10-13 | Disposition: A | Discharge status: Home / Self Care | Payer: Medicare Other | Attending: Emergency Medicine | Admitting: Emergency Medicine

## 2021-10-13 DIAGNOSIS — Z79899 Other long term (current) drug therapy: Secondary | ICD-10-CM | POA: Diagnosis not present

## 2021-10-13 DIAGNOSIS — Z8616 Personal history of COVID-19: Secondary | ICD-10-CM | POA: Insufficient documentation

## 2021-10-13 DIAGNOSIS — E1151 Type 2 diabetes mellitus with diabetic peripheral angiopathy without gangrene: Secondary | ICD-10-CM | POA: Insufficient documentation

## 2021-10-13 DIAGNOSIS — E1129 Type 2 diabetes mellitus with other diabetic kidney complication: Secondary | ICD-10-CM | POA: Diagnosis not present

## 2021-10-13 DIAGNOSIS — D631 Anemia in chronic kidney disease: Secondary | ICD-10-CM | POA: Diagnosis not present

## 2021-10-13 DIAGNOSIS — K921 Melena: Secondary | ICD-10-CM | POA: Diagnosis not present

## 2021-10-13 DIAGNOSIS — Q632 Ectopic kidney: Secondary | ICD-10-CM | POA: Diagnosis not present

## 2021-10-13 DIAGNOSIS — I120 Hypertensive chronic kidney disease with stage 5 chronic kidney disease or end stage renal disease: Secondary | ICD-10-CM | POA: Insufficient documentation

## 2021-10-13 DIAGNOSIS — N2581 Secondary hyperparathyroidism of renal origin: Secondary | ICD-10-CM | POA: Diagnosis not present

## 2021-10-13 DIAGNOSIS — K219 Gastro-esophageal reflux disease without esophagitis: Secondary | ICD-10-CM | POA: Insufficient documentation

## 2021-10-13 DIAGNOSIS — N186 End stage renal disease: Secondary | ICD-10-CM | POA: Insufficient documentation

## 2021-10-13 DIAGNOSIS — K449 Diaphragmatic hernia without obstruction or gangrene: Secondary | ICD-10-CM | POA: Diagnosis not present

## 2021-10-13 DIAGNOSIS — D688 Other specified coagulation defects: Secondary | ICD-10-CM | POA: Diagnosis not present

## 2021-10-13 DIAGNOSIS — F0390 Unspecified dementia without behavioral disturbance: Secondary | ICD-10-CM | POA: Diagnosis not present

## 2021-10-13 DIAGNOSIS — Z992 Dependence on renal dialysis: Secondary | ICD-10-CM | POA: Diagnosis not present

## 2021-10-13 DIAGNOSIS — R519 Headache, unspecified: Secondary | ICD-10-CM | POA: Diagnosis not present

## 2021-10-13 DIAGNOSIS — K8020 Calculus of gallbladder without cholecystitis without obstruction: Secondary | ICD-10-CM | POA: Diagnosis not present

## 2021-10-13 DIAGNOSIS — K5900 Constipation, unspecified: Secondary | ICD-10-CM | POA: Insufficient documentation

## 2021-10-13 DIAGNOSIS — R1030 Lower abdominal pain, unspecified: Secondary | ICD-10-CM | POA: Insufficient documentation

## 2021-10-13 DIAGNOSIS — Z794 Long term (current) use of insulin: Secondary | ICD-10-CM | POA: Insufficient documentation

## 2021-10-13 DIAGNOSIS — Z23 Encounter for immunization: Secondary | ICD-10-CM | POA: Diagnosis not present

## 2021-10-13 DIAGNOSIS — N261 Atrophy of kidney (terminal): Secondary | ICD-10-CM | POA: Diagnosis not present

## 2021-10-13 LAB — CBC WITH DIFFERENTIAL/PLATELET
Abs Immature Granulocytes: 0.05 10*3/uL (ref 0.00–0.07)
Basophils Absolute: 0.1 10*3/uL (ref 0.0–0.1)
Basophils Relative: 1 %
Eosinophils Absolute: 0 10*3/uL (ref 0.0–0.5)
Eosinophils Relative: 0 %
HCT: 42.2 % (ref 36.0–46.0)
Hemoglobin: 12.8 g/dL (ref 12.0–15.0)
Immature Granulocytes: 1 %
Lymphocytes Relative: 23 %
Lymphs Abs: 1.6 10*3/uL (ref 0.7–4.0)
MCH: 27.9 pg (ref 26.0–34.0)
MCHC: 30.3 g/dL (ref 30.0–36.0)
MCV: 92.1 fL (ref 80.0–100.0)
Monocytes Absolute: 0.6 10*3/uL (ref 0.1–1.0)
Monocytes Relative: 8 %
Neutro Abs: 4.7 10*3/uL (ref 1.7–7.7)
Neutrophils Relative %: 67 %
Platelets: 126 10*3/uL — ABNORMAL LOW (ref 150–400)
RBC: 4.58 MIL/uL (ref 3.87–5.11)
RDW: 17 % — ABNORMAL HIGH (ref 11.5–15.5)
WBC: 7 10*3/uL (ref 4.0–10.5)
nRBC: 0 % (ref 0.0–0.2)

## 2021-10-13 LAB — TYPE AND SCREEN
ABO/RH(D): O POS
Antibody Screen: NEGATIVE

## 2021-10-13 LAB — COMPREHENSIVE METABOLIC PANEL
ALT: 19 U/L (ref 0–44)
AST: 29 U/L (ref 15–41)
Albumin: 3.5 g/dL (ref 3.5–5.0)
Alkaline Phosphatase: 67 U/L (ref 38–126)
Anion gap: 16 — ABNORMAL HIGH (ref 5–15)
BUN: 12 mg/dL (ref 8–23)
CO2: 29 mmol/L (ref 22–32)
Calcium: 9.3 mg/dL (ref 8.9–10.3)
Chloride: 92 mmol/L — ABNORMAL LOW (ref 98–111)
Creatinine, Ser: 4.61 mg/dL — ABNORMAL HIGH (ref 0.44–1.00)
GFR, Estimated: 9 mL/min — ABNORMAL LOW (ref >60)
Glucose, Bld: 217 mg/dL — ABNORMAL HIGH (ref 70–99)
Potassium: 3.2 mmol/L — ABNORMAL LOW (ref 3.5–5.1)
Sodium: 137 mmol/L (ref 135–145)
Total Bilirubin: 0.7 mg/dL (ref 0.3–1.2)
Total Protein: 7.5 g/dL (ref 6.5–8.1)

## 2021-10-13 LAB — POC OCCULT BLOOD, ED: Fecal Occult Bld: POSITIVE — AB

## 2021-10-13 LAB — PROTIME-INR
INR: 1.2 (ref 0.8–1.2)
Prothrombin Time: 14.7 seconds (ref 11.4–15.2)

## 2021-10-13 LAB — LIPASE, BLOOD: Lipase: 32 U/L (ref 11–51)

## 2021-10-13 NOTE — ED Provider Notes (Signed)
[facility] EMERGENCY DEPARTMENT Provider Note   CSN: [id number] Arrival date & time: 10/13/21  1222     History Chief Complaint  Patient presents with   GI Bleeding    Katie Garcia is a 80 y.o. female with a history of end-stage renal disease on dialysis Tuesday Thursday Saturday, type 2 diabetes, hypertension, presenting to the ED with constipation and some blood in stool.  The patient reports that she has had intermittent diarrhea and constipation for several days.  She did take an Imodium yesterday for diarrhea, subsequently feels like she has little backed up.  She is not on laxatives.  She did have a very small bowel movement earlier today.  She reports there is some bright red blood when she wiped her rectum.  She reports intermittent mild cramping lower abdominal pain, but nothing persistent.  She is not sure how long it lasts or how often she has had it.  She denies any known history of diverticulosis or colitis, but states she has not had a colonoscopy that she is aware of.  She is not on blood thinners.  Her daughter is present at bedside for the entirety of my history and confirms this history.  HPI     Past Medical History:  Diagnosis Date   Anemia    Arthritis    Dementia ([place])    Diabetes mellitus without complication ([place])    ESRD    Dialysis T/Th/Sa at [place]   GERD (gastroesophageal reflux disease)    Gout    Headache    Hypertension    Type 2 diabetes mellitus ([place])     Patient Active Problem List   Diagnosis Date Noted   Adjustment disorder with depressed mood 02/16/2021   Chronic allergic bronchitis 02/16/2021   Chronic serous otitis media 02/16/2021   Dependence on renal dialysis ([place]) 02/16/2021   Dyslipidemia 02/16/2021   Exacerbation of asthma 02/16/2021   Gastroesophageal reflux disease 02/16/2021   Hyperphosphatemia 02/16/2021   Moderate major depression, single episode ([place]) 02/16/2021   Morbid obesity ([place]) 02/16/2021    Vitiligo 02/16/2021   Headache, unspecified 09/14/2020   Infection and inflammatory reaction due to other internal prosthetic devices, implants and grafts, initial encounter ([place]) 51/01/5851   Complication of vascular dialysis catheter 03/25/2020   Pneumonia due to COVID-19 virus 12/20/2019   COVID-19 virus infection 12/16/2019   ESRD (end stage renal disease) ([place]) 77/82/4235   Acute metabolic encephalopathy 36/14/4315   Allergy, unspecified, initial encounter 07/28/2019   Anaphylactic shock, unspecified, initial encounter 07/28/2019   Essential hypertension 02/11/2019   Mild protein-calorie malnutrition ([place]) 11/28/2018   Chronic kidney disease (CKD), stage V ([place]) 10/28/2018   Gout 10/28/2018   HLD (hyperlipidemia) 10/28/2018   Hyperparathyroidism ([place]) 10/28/2018   Pelvic kidney 10/28/2018   Pre-transplant evaluation for CKD (chronic kidney disease) 10/28/2018   Proteinuria 10/28/2018   Vertigo 10/28/2018   Fluid overload, unspecified 10/25/2018   Arteriovenous fistula, acquired ([place]) 10/21/2018   Fever, unspecified 10/21/2018   Hyperparathyroidism due to renal insufficiency ([place]) 10/21/2018   Other specified coagulation defects ([place]) 10/21/2018   Pruritus, unspecified 10/21/2018   Shortness of breath 10/21/2018   Type 2 diabetes mellitus with diabetic peripheral angiopathy without gangrene ([place]) 10/21/2018   Ingrown nail of great toe of left foot 10/10/2018   Murmur, cardiac 04/02/2018   Iron deficiency anemia 02/23/2017   Anemia in chronic kidney disease 02/09/2017   Drug noncompliance 03/04/2014   Diarrhea 03/04/2014   Acute renal  failure ([place]) 03/04/2014   DM (diabetes mellitus), secondary uncontrolled ([place]) 03/04/2014   ARF (acute renal failure) ([place]) 03/04/2014   Renal failure, acute ([place]) 03/04/2014    Past Surgical History:  Procedure Laterality Date   ABDOMINAL HYSTERECTOMY     AV FISTULA PLACEMENT Left 10/02/2016   Procedure: ARTERIOVENOUS (AV) FISTULA  CREATION LEFT UPPER ARM;  Surgeon: Waynetta Sandy, MD;  Location: [facility];  Service: Vascular;  Laterality: Left;   AV FISTULA PLACEMENT Right 07/14/2020   Procedure: RIGHT ARM ARTERIOVENOUS FISTULA CREATION;  Surgeon: Waynetta Sandy, MD;  Location: [facility];  Service: Vascular;  Laterality: Right;   AV FISTULA PLACEMENT Left 05/16/2021   Procedure: INSERTION OF LEFT UPPER ARM ARTERIOVENOUS (AV) GORE-TEX GRAFT;  Surgeon: Marty Heck, MD;  Location: [facility];  Service: Vascular;  Laterality: Left;   AV FISTULA PLACEMENT Right 07/13/2021   Procedure: RIGHT ARM ARTERIOVENOUS (AV) FISTULA GRAFT INSERTION;  Surgeon: Waynetta Sandy, MD;  Location: [facility];  Service: Vascular;  Laterality: Right;   [place] Left 05/17/2020   Procedure: Insertion of Left arm arteriovenous gortex graftarm ;  Surgeon: Angelia Mould, MD;  Location: [facility];  Service: Vascular;  Laterality: Left;   CESAREAN SECTION     DIALYSIS/PERMA CATHETER INSERTION     REVISON OF ARTERIOVENOUS FISTULA Right 09/15/2020   Procedure: REVISON OF RIGHT UPPER ARM  ARTERIOVENOUS FISTULA;  Surgeon: Waynetta Sandy, MD;  Location: [facility];  Service: Vascular;  Laterality: Right;   UPPER EXTREMITY VENOGRAPHY Bilateral 04/18/2021   Procedure: UPPER EXTREMITY VENOGRAPHY;  Surgeon: Waynetta Sandy, MD;  Location: [facility];  Service: Cardiovascular;  Laterality: Bilateral;     OB History   No obstetric history on file.     Family History  Problem Relation Age of Onset   Diabetes Mother     Social History   Tobacco Use   Smoking status: Never   Smokeless tobacco: Never  Vaping Use   Vaping Use: Never used  Substance Use Topics   Alcohol use: Not Currently   Drug use: No    Home Medications Prior to Admission medications   Medication Sig Start Date End Date Taking? Authorizing Provider  Accu-Chek FastClix Lancets MISC  03/31/18   [provider]   ACCU-CHEK GUIDE test strip 1 each by Other route in the morning and at bedtime. E11.22 10/06/18   [provider]  acetaminophen (TYLENOL) 500 MG tablet Take 500-1,000 mg by mouth every 6 (six) hours as needed for moderate pain or headache.    [provider]  allopurinol (ZYLOPRIM) 100 MG tablet Take 100 mg by mouth every other day. After dialysis    [provider]  B Complex-C-Zn-Folic Acid (DIALYVITE 465-KCLE 15) 0.8 MG TABS Take 1 tablet by mouth daily with supper. 06/30/21   [provider]  Blood Glucose Monitoring Suppl (ACCU-CHEK GUIDE) w/Device KIT     [provider]  calcium acetate (PHOSLO) 667 MG capsule Take 2,001 mg by mouth See admin instructions. Take 2 capsules (2001 mg) by mouth twice daily with meals on dialysis days (Tuesdays, Thursdays & Saturdays) Take 2 capsules (2031m) by mouth 3 times daily with meals on non-dialysis days (Sundays, Mondays, Wednesdays & Fridays) 10/24/18   [provider]  carvedilol (COREG) 25 MG tablet Take 25 mg by mouth in the morning and at bedtime.     [provider]  clindamycin (CLEOCIN) 300 MG capsule Take 1 capsule (300  mg total) by mouth 3 (three) times daily. Do not take on morning of hemodialysis treatment days 07/18/21   Barbie Banner, PA-C  famotidine (PEPCID) 20 MG tablet Take 20 mg by mouth at bedtime.    [provider]  guaiFENesin (MUCINEX) 600 MG 12 hr tablet Take 600 mg by mouth daily as needed for to loosen phlegm.    [provider]  guaifenesin (ROBITUSSIN) 100 MG/5ML syrup Take 100 mg by mouth 3 (three) times daily as needed for cough.    [provider]  HYDROcodone-acetaminophen (NORCO/VICODIN) 5-325 MG tablet Take 1 tablet by mouth every 6 (six) hours as needed for moderate pain. 07/13/21 07/13/22  Baglia, Corrina, PA-C  insulin NPH-regular Human (70-30) 100 UNIT/ML injection Inject 31 Units into the skin daily with breakfast. 07/08/21    Renato Shin, MD  iron sucrose in sodium chloride 0.9 % 100 mL Iron Sucrose (Venofer) 10/21/20 01/12/22  [provider]  loratadine (CLARITIN) 10 MG tablet Take 10 mg by mouth in the morning. 11/12/20   [provider]  losartan (COZAAR) 25 MG tablet Take 25 mg by mouth at bedtime. 04/12/21   [provider]  meclizine (ANTIVERT) 25 MG tablet Take 1 tablet (25 mg total) by mouth 3 (three) times daily as needed for dizziness. 05/25/21   Deno Etienne, DO  Methoxy PEG-Epoetin Beta (MIRCERA IJ) Mircera 02/10/21 02/09/22  [provider]  Nutritional Supplements (FEEDING SUPPLEMENT, NEPRO CARB STEADY,) LIQD Take 237 mLs by mouth every Tuesday, Thursday, and Saturday at 6 PM.    [provider]  RELION INSULIN SYR 0.5ML/31G 31G X 5/16" 0.5 ML MISC USE 1 ONCE DAILY 03/05/19   [provider]  rosuvastatin (CRESTOR) 10 MG tablet Take 10 mg by mouth every evening.    [provider]    Allergies    Penicillins  Review of Systems   Review of Systems  Constitutional:  Negative for chills and fever.  Eyes:  Negative for pain and visual disturbance.  Respiratory:  Negative for cough and shortness of breath.   Cardiovascular:  Negative for chest pain and palpitations.  Gastrointestinal:  Positive for abdominal pain, blood in stool, constipation and diarrhea. Negative for vomiting.  Musculoskeletal:  Negative for arthralgias and myalgias.  Skin:  Negative for color change and rash.  Neurological:  Negative for syncope and headaches.  All other systems reviewed and are negative.  Physical Exam Updated Vital Signs BP (!) 131/48   Pulse 73   Temp 98.8 F (37.1 C) (Oral)   Resp 15   SpO2 98%   Physical Exam Constitutional:      General: She is not in acute distress.    Appearance: She is obese.  HENT:     Head: Normocephalic and atraumatic.  Eyes:     Conjunctiva/sclera: Conjunctivae normal.     Pupils: Pupils are equal, round, and  reactive to light.  Cardiovascular:     Rate and Rhythm: Normal rate and regular rhythm.  Pulmonary:     Effort: Pulmonary effort is normal. No respiratory distress.  Abdominal:     General: There is no distension.     Tenderness: There is no abdominal tenderness.  Genitourinary:    Comments: Rectal exam performed with Camryn Cogan RN present as chaperone No external hemorrhoids or visible bleeding No melena on exam No stool in rectal vault Possible small internal hemorrhoid, without active bleeding Musculoskeletal:     Comments: Fistula right arm  Skin:  General: Skin is warm and dry.  Neurological:     General: No focal deficit present.     Mental Status: She is alert. Mental status is at baseline.  Psychiatric:        Mood and Affect: Mood normal.        Behavior: Behavior normal.    ED Results / Procedures / Treatments   Labs (all labs ordered are listed, but only abnormal results are displayed) Labs Reviewed  CBC WITH DIFFERENTIAL/PLATELET - Abnormal; Notable for the following components:      Result Value   RDW 17.0 (*)    Platelets 126 (*)    All other components within normal limits  COMPREHENSIVE METABOLIC PANEL - Abnormal; Notable for the following components:   Potassium 3.2 (*)    Chloride 92 (*)    Glucose, Bld 217 (*)    Creatinine, Ser 4.61 (*)    GFR, Estimated 9 (*)    Anion gap 16 (*)    All other components within normal limits  POC OCCULT BLOOD, ED - Abnormal; Notable for the following components:   Fecal Occult Bld POSITIVE (*)    All other components within normal limits  LIPASE, BLOOD  PROTIME-INR  URINALYSIS, ROUTINE W REFLEX MICROSCOPIC  CBC WITH DIFFERENTIAL/PLATELET  TYPE AND SCREEN    EKG None  Radiology No results found.  Procedures Procedures   Medications Ordered in ED Medications - No data to display  ED Course  I have reviewed the triage vital signs and the nursing notes.  Pertinent labs & imaging results that were  available during my care of the patient were reviewed by me and considered in my medical decision making (see chart for details).  Patient is here with small amount of blood in her stool with intermittent diarrhea and constipation cramping lower abdominal pain.  Suspect is most likely related diverticulitis or colitis, likely a mild case, or else hemorrhoidal bleeding in the setting of recent constipation.  She otherwise has benign abdominal exam, and I have a low suspicion for abdominal perforation, sepsis, or other acute emergency.  Supplemental history is provided by the patient's daughter at bedside.  The patient did complete dialysis today as regularly scheduled.  She has no evidence of volume overload and no immediate indication for emergent hemodialysis.  I personally reviewed interpreted the patient's labs, and imaging.  CMP is largely unremarkable.  CBC shows a normal hemoglobin at 12.8.  White blood cell count is within normal limits.  I doubt significant blood loss anemia or significant GI bleed.  She is not on anticoagulation.  We will proceed with CT scan of the abdomen to evaluate for evidence of colitis or diverticulitis.  Clinical Course as of 10/13/21 1652  Thu Oct 13, 2021  1355 Fecal Occult Blood, POC(!): POSITIVE [MT]  1355 Pt signed out to EDP Dr Reita May pending f/u on CT imaging, anticipate likely discharge home [MT]    Clinical Course User Index [MT] Adonnis Salceda, Carola Rhine, MD      Final Clinical Impression(s) / ED Diagnoses Final diagnoses:  None    Rx / DC Orders ED Discharge Orders     None        Wyvonnia Dusky, MD 10/13/21 [phone]

## 2021-10-13 NOTE — Discharge Instructions (Addendum)
Please follow-up with your primary care doctor for close follow-up appointment.  Would additionally recommend following up with a gastroenterologist.  If you do not have 1 you can call the provider on call today to request an appointment.  Your primary care doctor can also help with this referral.  If you have any worsening abdominal pain, any further episodes of blood in stool or dark tarry stools, or other new concerning symptom, come back to ER for reassessment.

## 2021-10-13 NOTE — ED Triage Notes (Signed)
Pt complains of dark tarry stools for one week. Today noticed bright red blood. Pt having lower abd cramping as well. Pt went to dialysis today (TTS treatment).

## 2021-10-13 NOTE — ED Provider Notes (Signed)
Emergency Medicine Provider Triage Evaluation Note  Katie Garcia , a 80 y.o. female  was evaluated in triage.  Pt complains of dark tarry stool/bright red blood per rectum.  The text was been going on for a week, the bright red blood started this morning.  Is having diffuse abdominal cramping.  She has had 1 episode of diarrhea, took Lomotil.  She is not on any blood thinners, does not take any anti-inflammatories.  Unsure when last colonoscopy was, denies any abnormal findings.  Does not take Pepto-Bismol or iron supplements.  Review of Systems  Positive: Dark tarry stool, abdominal pain Negative: Vomiting  Physical Exam  BP 118/66 (BP Location: Right Arm)   Pulse 92   Temp 98.8 F (37.1 C) (Oral)   Resp 20   SpO2 99%  Gen:   Awake, no distress   Resp:  Normal effort  MSK:   Moves extremities without difficulty  Other:  Abdomen is soft, diffuse periumbilical tenderness.  Medical Decision Making  Medically screening exam initiated at 12:27 PM.  Appropriate orders placed.  Venus Gregorek Heitz was informed that the remainder of the evaluation will be completed by another provider, this initial triage assessment does not replace that evaluation, and the importance of remaining in the ED until their evaluation is complete.  Abdominal labs, will check H&H.  Fecal occult deferred till patient is seen in the back.   Sherrill Raring, PA-C 10/13/21 [place], [place], DO 10/14/21 2003

## 2021-10-15 DIAGNOSIS — N2581 Secondary hyperparathyroidism of renal origin: Secondary | ICD-10-CM | POA: Diagnosis not present

## 2021-10-15 DIAGNOSIS — R519 Headache, unspecified: Secondary | ICD-10-CM | POA: Diagnosis not present

## 2021-10-15 DIAGNOSIS — E1129 Type 2 diabetes mellitus with other diabetic kidney complication: Secondary | ICD-10-CM | POA: Diagnosis not present

## 2021-10-15 DIAGNOSIS — N186 End stage renal disease: Secondary | ICD-10-CM | POA: Diagnosis not present

## 2021-10-15 DIAGNOSIS — D688 Other specified coagulation defects: Secondary | ICD-10-CM | POA: Diagnosis not present

## 2021-10-15 DIAGNOSIS — Z23 Encounter for immunization: Secondary | ICD-10-CM | POA: Diagnosis not present

## 2021-10-15 DIAGNOSIS — Z992 Dependence on renal dialysis: Secondary | ICD-10-CM | POA: Diagnosis not present

## 2021-10-18 DIAGNOSIS — Z992 Dependence on renal dialysis: Secondary | ICD-10-CM | POA: Diagnosis not present

## 2021-10-18 DIAGNOSIS — Z23 Encounter for immunization: Secondary | ICD-10-CM | POA: Diagnosis not present

## 2021-10-18 DIAGNOSIS — R519 Headache, unspecified: Secondary | ICD-10-CM | POA: Diagnosis not present

## 2021-10-18 DIAGNOSIS — E1129 Type 2 diabetes mellitus with other diabetic kidney complication: Secondary | ICD-10-CM | POA: Diagnosis not present

## 2021-10-18 DIAGNOSIS — N2581 Secondary hyperparathyroidism of renal origin: Secondary | ICD-10-CM | POA: Diagnosis not present

## 2021-10-18 DIAGNOSIS — N186 End stage renal disease: Secondary | ICD-10-CM | POA: Diagnosis not present

## 2021-10-18 DIAGNOSIS — D688 Other specified coagulation defects: Secondary | ICD-10-CM | POA: Diagnosis not present

## 2021-10-20 DIAGNOSIS — Z23 Encounter for immunization: Secondary | ICD-10-CM | POA: Diagnosis not present

## 2021-10-20 DIAGNOSIS — N2581 Secondary hyperparathyroidism of renal origin: Secondary | ICD-10-CM | POA: Diagnosis not present

## 2021-10-20 DIAGNOSIS — E1129 Type 2 diabetes mellitus with other diabetic kidney complication: Secondary | ICD-10-CM | POA: Diagnosis not present

## 2021-10-20 DIAGNOSIS — D688 Other specified coagulation defects: Secondary | ICD-10-CM | POA: Diagnosis not present

## 2021-10-20 DIAGNOSIS — N186 End stage renal disease: Secondary | ICD-10-CM | POA: Diagnosis not present

## 2021-10-20 DIAGNOSIS — Z992 Dependence on renal dialysis: Secondary | ICD-10-CM | POA: Diagnosis not present

## 2021-10-20 DIAGNOSIS — R519 Headache, unspecified: Secondary | ICD-10-CM | POA: Diagnosis not present

## 2021-10-22 DIAGNOSIS — R519 Headache, unspecified: Secondary | ICD-10-CM | POA: Diagnosis not present

## 2021-10-22 DIAGNOSIS — N186 End stage renal disease: Secondary | ICD-10-CM | POA: Diagnosis not present

## 2021-10-22 DIAGNOSIS — N2581 Secondary hyperparathyroidism of renal origin: Secondary | ICD-10-CM | POA: Diagnosis not present

## 2021-10-22 DIAGNOSIS — Z23 Encounter for immunization: Secondary | ICD-10-CM | POA: Diagnosis not present

## 2021-10-22 DIAGNOSIS — D688 Other specified coagulation defects: Secondary | ICD-10-CM | POA: Diagnosis not present

## 2021-10-22 DIAGNOSIS — E1129 Type 2 diabetes mellitus with other diabetic kidney complication: Secondary | ICD-10-CM | POA: Diagnosis not present

## 2021-10-22 DIAGNOSIS — Z992 Dependence on renal dialysis: Secondary | ICD-10-CM | POA: Diagnosis not present

## 2021-10-24 DIAGNOSIS — E1122 Type 2 diabetes mellitus with diabetic chronic kidney disease: Secondary | ICD-10-CM | POA: Diagnosis not present

## 2021-10-24 DIAGNOSIS — Z992 Dependence on renal dialysis: Secondary | ICD-10-CM | POA: Diagnosis not present

## 2021-10-24 DIAGNOSIS — N186 End stage renal disease: Secondary | ICD-10-CM | POA: Diagnosis not present

## 2021-10-25 DIAGNOSIS — D509 Iron deficiency anemia, unspecified: Secondary | ICD-10-CM | POA: Diagnosis not present

## 2021-10-25 DIAGNOSIS — R519 Headache, unspecified: Secondary | ICD-10-CM | POA: Diagnosis not present

## 2021-10-25 DIAGNOSIS — D631 Anemia in chronic kidney disease: Secondary | ICD-10-CM | POA: Diagnosis not present

## 2021-10-25 DIAGNOSIS — E1129 Type 2 diabetes mellitus with other diabetic kidney complication: Secondary | ICD-10-CM | POA: Diagnosis not present

## 2021-10-25 DIAGNOSIS — Z992 Dependence on renal dialysis: Secondary | ICD-10-CM | POA: Diagnosis not present

## 2021-10-25 DIAGNOSIS — N186 End stage renal disease: Secondary | ICD-10-CM | POA: Diagnosis not present

## 2021-10-25 DIAGNOSIS — D688 Other specified coagulation defects: Secondary | ICD-10-CM | POA: Diagnosis not present

## 2021-10-25 DIAGNOSIS — N2581 Secondary hyperparathyroidism of renal origin: Secondary | ICD-10-CM | POA: Diagnosis not present

## 2021-10-26 DIAGNOSIS — K8020 Calculus of gallbladder without cholecystitis without obstruction: Secondary | ICD-10-CM | POA: Diagnosis not present

## 2021-10-26 DIAGNOSIS — R058 Other specified cough: Secondary | ICD-10-CM | POA: Diagnosis not present

## 2021-10-26 DIAGNOSIS — D649 Anemia, unspecified: Secondary | ICD-10-CM | POA: Diagnosis not present

## 2021-10-26 DIAGNOSIS — K648 Other hemorrhoids: Secondary | ICD-10-CM | POA: Diagnosis not present

## 2021-10-26 DIAGNOSIS — R1030 Lower abdominal pain, unspecified: Secondary | ICD-10-CM | POA: Diagnosis not present

## 2021-10-27 DIAGNOSIS — N186 End stage renal disease: Secondary | ICD-10-CM | POA: Diagnosis not present

## 2021-10-27 DIAGNOSIS — N2581 Secondary hyperparathyroidism of renal origin: Secondary | ICD-10-CM | POA: Diagnosis not present

## 2021-10-27 DIAGNOSIS — D688 Other specified coagulation defects: Secondary | ICD-10-CM | POA: Diagnosis not present

## 2021-10-27 DIAGNOSIS — R519 Headache, unspecified: Secondary | ICD-10-CM | POA: Diagnosis not present

## 2021-10-27 DIAGNOSIS — E1129 Type 2 diabetes mellitus with other diabetic kidney complication: Secondary | ICD-10-CM | POA: Diagnosis not present

## 2021-10-27 DIAGNOSIS — D631 Anemia in chronic kidney disease: Secondary | ICD-10-CM | POA: Diagnosis not present

## 2021-10-27 DIAGNOSIS — Z992 Dependence on renal dialysis: Secondary | ICD-10-CM | POA: Diagnosis not present

## 2021-10-27 DIAGNOSIS — D509 Iron deficiency anemia, unspecified: Secondary | ICD-10-CM | POA: Diagnosis not present

## 2021-10-29 DIAGNOSIS — N2581 Secondary hyperparathyroidism of renal origin: Secondary | ICD-10-CM | POA: Diagnosis not present

## 2021-10-29 DIAGNOSIS — D631 Anemia in chronic kidney disease: Secondary | ICD-10-CM | POA: Diagnosis not present

## 2021-10-29 DIAGNOSIS — D509 Iron deficiency anemia, unspecified: Secondary | ICD-10-CM | POA: Diagnosis not present

## 2021-10-29 DIAGNOSIS — E1129 Type 2 diabetes mellitus with other diabetic kidney complication: Secondary | ICD-10-CM | POA: Diagnosis not present

## 2021-10-29 DIAGNOSIS — Z992 Dependence on renal dialysis: Secondary | ICD-10-CM | POA: Diagnosis not present

## 2021-10-29 DIAGNOSIS — R519 Headache, unspecified: Secondary | ICD-10-CM | POA: Diagnosis not present

## 2021-10-29 DIAGNOSIS — D688 Other specified coagulation defects: Secondary | ICD-10-CM | POA: Diagnosis not present

## 2021-10-29 DIAGNOSIS — N186 End stage renal disease: Secondary | ICD-10-CM | POA: Diagnosis not present

## 2021-11-01 DIAGNOSIS — D631 Anemia in chronic kidney disease: Secondary | ICD-10-CM | POA: Diagnosis not present

## 2021-11-01 DIAGNOSIS — N2581 Secondary hyperparathyroidism of renal origin: Secondary | ICD-10-CM | POA: Diagnosis not present

## 2021-11-01 DIAGNOSIS — Z992 Dependence on renal dialysis: Secondary | ICD-10-CM | POA: Diagnosis not present

## 2021-11-01 DIAGNOSIS — N186 End stage renal disease: Secondary | ICD-10-CM | POA: Diagnosis not present

## 2021-11-01 DIAGNOSIS — D688 Other specified coagulation defects: Secondary | ICD-10-CM | POA: Diagnosis not present

## 2021-11-01 DIAGNOSIS — E1129 Type 2 diabetes mellitus with other diabetic kidney complication: Secondary | ICD-10-CM | POA: Diagnosis not present

## 2021-11-01 DIAGNOSIS — D509 Iron deficiency anemia, unspecified: Secondary | ICD-10-CM | POA: Diagnosis not present

## 2021-11-01 DIAGNOSIS — R519 Headache, unspecified: Secondary | ICD-10-CM | POA: Diagnosis not present

## 2021-11-03 DIAGNOSIS — Z992 Dependence on renal dialysis: Secondary | ICD-10-CM | POA: Diagnosis not present

## 2021-11-03 DIAGNOSIS — D631 Anemia in chronic kidney disease: Secondary | ICD-10-CM | POA: Diagnosis not present

## 2021-11-03 DIAGNOSIS — R519 Headache, unspecified: Secondary | ICD-10-CM | POA: Diagnosis not present

## 2021-11-03 DIAGNOSIS — N186 End stage renal disease: Secondary | ICD-10-CM | POA: Diagnosis not present

## 2021-11-03 DIAGNOSIS — E1129 Type 2 diabetes mellitus with other diabetic kidney complication: Secondary | ICD-10-CM | POA: Diagnosis not present

## 2021-11-03 DIAGNOSIS — D688 Other specified coagulation defects: Secondary | ICD-10-CM | POA: Diagnosis not present

## 2021-11-03 DIAGNOSIS — N2581 Secondary hyperparathyroidism of renal origin: Secondary | ICD-10-CM | POA: Diagnosis not present

## 2021-11-03 DIAGNOSIS — D509 Iron deficiency anemia, unspecified: Secondary | ICD-10-CM | POA: Diagnosis not present

## 2021-11-05 DIAGNOSIS — N186 End stage renal disease: Secondary | ICD-10-CM | POA: Diagnosis not present

## 2021-11-05 DIAGNOSIS — E1129 Type 2 diabetes mellitus with other diabetic kidney complication: Secondary | ICD-10-CM | POA: Diagnosis not present

## 2021-11-05 DIAGNOSIS — N2581 Secondary hyperparathyroidism of renal origin: Secondary | ICD-10-CM | POA: Diagnosis not present

## 2021-11-05 DIAGNOSIS — D509 Iron deficiency anemia, unspecified: Secondary | ICD-10-CM | POA: Diagnosis not present

## 2021-11-05 DIAGNOSIS — D631 Anemia in chronic kidney disease: Secondary | ICD-10-CM | POA: Diagnosis not present

## 2021-11-05 DIAGNOSIS — D688 Other specified coagulation defects: Secondary | ICD-10-CM | POA: Diagnosis not present

## 2021-11-05 DIAGNOSIS — R519 Headache, unspecified: Secondary | ICD-10-CM | POA: Diagnosis not present

## 2021-11-05 DIAGNOSIS — Z992 Dependence on renal dialysis: Secondary | ICD-10-CM | POA: Diagnosis not present

## 2021-11-08 DIAGNOSIS — D509 Iron deficiency anemia, unspecified: Secondary | ICD-10-CM | POA: Diagnosis not present

## 2021-11-08 DIAGNOSIS — R519 Headache, unspecified: Secondary | ICD-10-CM | POA: Diagnosis not present

## 2021-11-08 DIAGNOSIS — D631 Anemia in chronic kidney disease: Secondary | ICD-10-CM | POA: Diagnosis not present

## 2021-11-08 DIAGNOSIS — D688 Other specified coagulation defects: Secondary | ICD-10-CM | POA: Diagnosis not present

## 2021-11-08 DIAGNOSIS — E1129 Type 2 diabetes mellitus with other diabetic kidney complication: Secondary | ICD-10-CM | POA: Diagnosis not present

## 2021-11-08 DIAGNOSIS — N186 End stage renal disease: Secondary | ICD-10-CM | POA: Diagnosis not present

## 2021-11-08 DIAGNOSIS — Z992 Dependence on renal dialysis: Secondary | ICD-10-CM | POA: Diagnosis not present

## 2021-11-08 DIAGNOSIS — N2581 Secondary hyperparathyroidism of renal origin: Secondary | ICD-10-CM | POA: Diagnosis not present

## 2021-11-10 DIAGNOSIS — R519 Headache, unspecified: Secondary | ICD-10-CM | POA: Diagnosis not present

## 2021-11-10 DIAGNOSIS — E1129 Type 2 diabetes mellitus with other diabetic kidney complication: Secondary | ICD-10-CM | POA: Diagnosis not present

## 2021-11-10 DIAGNOSIS — N2581 Secondary hyperparathyroidism of renal origin: Secondary | ICD-10-CM | POA: Diagnosis not present

## 2021-11-10 DIAGNOSIS — N186 End stage renal disease: Secondary | ICD-10-CM | POA: Diagnosis not present

## 2021-11-10 DIAGNOSIS — D509 Iron deficiency anemia, unspecified: Secondary | ICD-10-CM | POA: Diagnosis not present

## 2021-11-10 DIAGNOSIS — D688 Other specified coagulation defects: Secondary | ICD-10-CM | POA: Diagnosis not present

## 2021-11-10 DIAGNOSIS — D631 Anemia in chronic kidney disease: Secondary | ICD-10-CM | POA: Diagnosis not present

## 2021-11-10 DIAGNOSIS — Z992 Dependence on renal dialysis: Secondary | ICD-10-CM | POA: Diagnosis not present

## 2021-11-14 DIAGNOSIS — T82868A Thrombosis of vascular prosthetic devices, implants and grafts, initial encounter: Secondary | ICD-10-CM | POA: Diagnosis not present

## 2021-11-14 DIAGNOSIS — D688 Other specified coagulation defects: Secondary | ICD-10-CM | POA: Diagnosis not present

## 2021-11-14 DIAGNOSIS — I871 Compression of vein: Secondary | ICD-10-CM | POA: Diagnosis not present

## 2021-11-14 DIAGNOSIS — N2581 Secondary hyperparathyroidism of renal origin: Secondary | ICD-10-CM | POA: Diagnosis not present

## 2021-11-14 DIAGNOSIS — D631 Anemia in chronic kidney disease: Secondary | ICD-10-CM | POA: Diagnosis not present

## 2021-11-14 DIAGNOSIS — Z992 Dependence on renal dialysis: Secondary | ICD-10-CM | POA: Diagnosis not present

## 2021-11-14 DIAGNOSIS — D509 Iron deficiency anemia, unspecified: Secondary | ICD-10-CM | POA: Diagnosis not present

## 2021-11-14 DIAGNOSIS — R519 Headache, unspecified: Secondary | ICD-10-CM | POA: Diagnosis not present

## 2021-11-14 DIAGNOSIS — N186 End stage renal disease: Secondary | ICD-10-CM | POA: Diagnosis not present

## 2021-11-14 DIAGNOSIS — E1129 Type 2 diabetes mellitus with other diabetic kidney complication: Secondary | ICD-10-CM | POA: Diagnosis not present

## 2021-11-15 DIAGNOSIS — D631 Anemia in chronic kidney disease: Secondary | ICD-10-CM | POA: Diagnosis not present

## 2021-11-15 DIAGNOSIS — D509 Iron deficiency anemia, unspecified: Secondary | ICD-10-CM | POA: Diagnosis not present

## 2021-11-15 DIAGNOSIS — N186 End stage renal disease: Secondary | ICD-10-CM | POA: Diagnosis not present

## 2021-11-15 DIAGNOSIS — E1129 Type 2 diabetes mellitus with other diabetic kidney complication: Secondary | ICD-10-CM | POA: Diagnosis not present

## 2021-11-15 DIAGNOSIS — Z992 Dependence on renal dialysis: Secondary | ICD-10-CM | POA: Diagnosis not present

## 2021-11-15 DIAGNOSIS — R519 Headache, unspecified: Secondary | ICD-10-CM | POA: Diagnosis not present

## 2021-11-15 DIAGNOSIS — N2581 Secondary hyperparathyroidism of renal origin: Secondary | ICD-10-CM | POA: Diagnosis not present

## 2021-11-15 DIAGNOSIS — D688 Other specified coagulation defects: Secondary | ICD-10-CM | POA: Diagnosis not present

## 2021-11-18 DIAGNOSIS — N186 End stage renal disease: Secondary | ICD-10-CM | POA: Diagnosis not present

## 2021-11-18 DIAGNOSIS — D631 Anemia in chronic kidney disease: Secondary | ICD-10-CM | POA: Diagnosis not present

## 2021-11-18 DIAGNOSIS — E1129 Type 2 diabetes mellitus with other diabetic kidney complication: Secondary | ICD-10-CM | POA: Diagnosis not present

## 2021-11-18 DIAGNOSIS — D688 Other specified coagulation defects: Secondary | ICD-10-CM | POA: Diagnosis not present

## 2021-11-18 DIAGNOSIS — N2581 Secondary hyperparathyroidism of renal origin: Secondary | ICD-10-CM | POA: Diagnosis not present

## 2021-11-18 DIAGNOSIS — R519 Headache, unspecified: Secondary | ICD-10-CM | POA: Diagnosis not present

## 2021-11-18 DIAGNOSIS — D509 Iron deficiency anemia, unspecified: Secondary | ICD-10-CM | POA: Diagnosis not present

## 2021-11-18 DIAGNOSIS — Z992 Dependence on renal dialysis: Secondary | ICD-10-CM | POA: Diagnosis not present

## 2021-11-20 DIAGNOSIS — E1129 Type 2 diabetes mellitus with other diabetic kidney complication: Secondary | ICD-10-CM | POA: Diagnosis not present

## 2021-11-20 DIAGNOSIS — R519 Headache, unspecified: Secondary | ICD-10-CM | POA: Diagnosis not present

## 2021-11-20 DIAGNOSIS — N2581 Secondary hyperparathyroidism of renal origin: Secondary | ICD-10-CM | POA: Diagnosis not present

## 2021-11-20 DIAGNOSIS — N186 End stage renal disease: Secondary | ICD-10-CM | POA: Diagnosis not present

## 2021-11-20 DIAGNOSIS — Z992 Dependence on renal dialysis: Secondary | ICD-10-CM | POA: Diagnosis not present

## 2021-11-20 DIAGNOSIS — D631 Anemia in chronic kidney disease: Secondary | ICD-10-CM | POA: Diagnosis not present

## 2021-11-20 DIAGNOSIS — D509 Iron deficiency anemia, unspecified: Secondary | ICD-10-CM | POA: Diagnosis not present

## 2021-11-20 DIAGNOSIS — D688 Other specified coagulation defects: Secondary | ICD-10-CM | POA: Diagnosis not present

## 2021-11-22 DIAGNOSIS — Z992 Dependence on renal dialysis: Secondary | ICD-10-CM | POA: Diagnosis not present

## 2021-11-22 DIAGNOSIS — D688 Other specified coagulation defects: Secondary | ICD-10-CM | POA: Diagnosis not present

## 2021-11-22 DIAGNOSIS — R519 Headache, unspecified: Secondary | ICD-10-CM | POA: Diagnosis not present

## 2021-11-22 DIAGNOSIS — E1129 Type 2 diabetes mellitus with other diabetic kidney complication: Secondary | ICD-10-CM | POA: Diagnosis not present

## 2021-11-22 DIAGNOSIS — N186 End stage renal disease: Secondary | ICD-10-CM | POA: Diagnosis not present

## 2021-11-22 DIAGNOSIS — N2581 Secondary hyperparathyroidism of renal origin: Secondary | ICD-10-CM | POA: Diagnosis not present

## 2021-11-22 DIAGNOSIS — D631 Anemia in chronic kidney disease: Secondary | ICD-10-CM | POA: Diagnosis not present

## 2021-11-22 DIAGNOSIS — D509 Iron deficiency anemia, unspecified: Secondary | ICD-10-CM | POA: Diagnosis not present

## 2021-11-23 DIAGNOSIS — Z992 Dependence on renal dialysis: Secondary | ICD-10-CM | POA: Diagnosis not present

## 2021-11-23 DIAGNOSIS — E1122 Type 2 diabetes mellitus with diabetic chronic kidney disease: Secondary | ICD-10-CM | POA: Diagnosis not present

## 2021-11-23 DIAGNOSIS — N186 End stage renal disease: Secondary | ICD-10-CM | POA: Diagnosis not present

## 2021-11-24 DIAGNOSIS — N2581 Secondary hyperparathyroidism of renal origin: Secondary | ICD-10-CM | POA: Diagnosis not present

## 2021-11-24 DIAGNOSIS — T8249XA Other complication of vascular dialysis catheter, initial encounter: Secondary | ICD-10-CM | POA: Diagnosis not present

## 2021-11-24 DIAGNOSIS — D631 Anemia in chronic kidney disease: Secondary | ICD-10-CM | POA: Diagnosis not present

## 2021-11-24 DIAGNOSIS — E1129 Type 2 diabetes mellitus with other diabetic kidney complication: Secondary | ICD-10-CM | POA: Diagnosis not present

## 2021-11-24 DIAGNOSIS — N186 End stage renal disease: Secondary | ICD-10-CM | POA: Diagnosis not present

## 2021-11-24 DIAGNOSIS — Z992 Dependence on renal dialysis: Secondary | ICD-10-CM | POA: Diagnosis not present

## 2021-11-24 DIAGNOSIS — D509 Iron deficiency anemia, unspecified: Secondary | ICD-10-CM | POA: Diagnosis not present

## 2021-11-24 DIAGNOSIS — D688 Other specified coagulation defects: Secondary | ICD-10-CM | POA: Diagnosis not present

## 2021-11-24 DIAGNOSIS — R519 Headache, unspecified: Secondary | ICD-10-CM | POA: Diagnosis not present

## 2021-11-26 DIAGNOSIS — T8249XA Other complication of vascular dialysis catheter, initial encounter: Secondary | ICD-10-CM | POA: Diagnosis not present

## 2021-11-26 DIAGNOSIS — D631 Anemia in chronic kidney disease: Secondary | ICD-10-CM | POA: Diagnosis not present

## 2021-11-26 DIAGNOSIS — Z992 Dependence on renal dialysis: Secondary | ICD-10-CM | POA: Diagnosis not present

## 2021-11-26 DIAGNOSIS — N2581 Secondary hyperparathyroidism of renal origin: Secondary | ICD-10-CM | POA: Diagnosis not present

## 2021-11-26 DIAGNOSIS — D688 Other specified coagulation defects: Secondary | ICD-10-CM | POA: Diagnosis not present

## 2021-11-26 DIAGNOSIS — N186 End stage renal disease: Secondary | ICD-10-CM | POA: Diagnosis not present

## 2021-11-26 DIAGNOSIS — E1129 Type 2 diabetes mellitus with other diabetic kidney complication: Secondary | ICD-10-CM | POA: Diagnosis not present

## 2021-11-26 DIAGNOSIS — R519 Headache, unspecified: Secondary | ICD-10-CM | POA: Diagnosis not present

## 2021-11-26 DIAGNOSIS — D509 Iron deficiency anemia, unspecified: Secondary | ICD-10-CM | POA: Diagnosis not present

## 2021-11-29 DIAGNOSIS — E1129 Type 2 diabetes mellitus with other diabetic kidney complication: Secondary | ICD-10-CM | POA: Diagnosis not present

## 2021-11-29 DIAGNOSIS — D631 Anemia in chronic kidney disease: Secondary | ICD-10-CM | POA: Diagnosis not present

## 2021-11-29 DIAGNOSIS — R519 Headache, unspecified: Secondary | ICD-10-CM | POA: Diagnosis not present

## 2021-11-29 DIAGNOSIS — D509 Iron deficiency anemia, unspecified: Secondary | ICD-10-CM | POA: Diagnosis not present

## 2021-11-29 DIAGNOSIS — T8249XA Other complication of vascular dialysis catheter, initial encounter: Secondary | ICD-10-CM | POA: Diagnosis not present

## 2021-11-29 DIAGNOSIS — Z992 Dependence on renal dialysis: Secondary | ICD-10-CM | POA: Diagnosis not present

## 2021-11-29 DIAGNOSIS — N186 End stage renal disease: Secondary | ICD-10-CM | POA: Diagnosis not present

## 2021-11-29 DIAGNOSIS — N2581 Secondary hyperparathyroidism of renal origin: Secondary | ICD-10-CM | POA: Diagnosis not present

## 2021-11-29 DIAGNOSIS — D688 Other specified coagulation defects: Secondary | ICD-10-CM | POA: Diagnosis not present

## 2021-12-01 DIAGNOSIS — Z992 Dependence on renal dialysis: Secondary | ICD-10-CM | POA: Diagnosis not present

## 2021-12-01 DIAGNOSIS — D509 Iron deficiency anemia, unspecified: Secondary | ICD-10-CM | POA: Diagnosis not present

## 2021-12-01 DIAGNOSIS — T8249XA Other complication of vascular dialysis catheter, initial encounter: Secondary | ICD-10-CM | POA: Diagnosis not present

## 2021-12-01 DIAGNOSIS — D631 Anemia in chronic kidney disease: Secondary | ICD-10-CM | POA: Diagnosis not present

## 2021-12-01 DIAGNOSIS — D688 Other specified coagulation defects: Secondary | ICD-10-CM | POA: Diagnosis not present

## 2021-12-01 DIAGNOSIS — N186 End stage renal disease: Secondary | ICD-10-CM | POA: Diagnosis not present

## 2021-12-01 DIAGNOSIS — R519 Headache, unspecified: Secondary | ICD-10-CM | POA: Diagnosis not present

## 2021-12-01 DIAGNOSIS — E1129 Type 2 diabetes mellitus with other diabetic kidney complication: Secondary | ICD-10-CM | POA: Diagnosis not present

## 2021-12-01 DIAGNOSIS — N2581 Secondary hyperparathyroidism of renal origin: Secondary | ICD-10-CM | POA: Diagnosis not present

## 2021-12-02 DIAGNOSIS — I10 Essential (primary) hypertension: Secondary | ICD-10-CM | POA: Diagnosis not present

## 2021-12-02 DIAGNOSIS — E1122 Type 2 diabetes mellitus with diabetic chronic kidney disease: Secondary | ICD-10-CM | POA: Diagnosis not present

## 2021-12-02 DIAGNOSIS — E785 Hyperlipidemia, unspecified: Secondary | ICD-10-CM | POA: Diagnosis not present

## 2021-12-02 DIAGNOSIS — R1030 Lower abdominal pain, unspecified: Secondary | ICD-10-CM | POA: Diagnosis not present

## 2021-12-03 DIAGNOSIS — D509 Iron deficiency anemia, unspecified: Secondary | ICD-10-CM | POA: Diagnosis not present

## 2021-12-03 DIAGNOSIS — N2581 Secondary hyperparathyroidism of renal origin: Secondary | ICD-10-CM | POA: Diagnosis not present

## 2021-12-03 DIAGNOSIS — T8249XA Other complication of vascular dialysis catheter, initial encounter: Secondary | ICD-10-CM | POA: Diagnosis not present

## 2021-12-03 DIAGNOSIS — N186 End stage renal disease: Secondary | ICD-10-CM | POA: Diagnosis not present

## 2021-12-03 DIAGNOSIS — D688 Other specified coagulation defects: Secondary | ICD-10-CM | POA: Diagnosis not present

## 2021-12-03 DIAGNOSIS — R519 Headache, unspecified: Secondary | ICD-10-CM | POA: Diagnosis not present

## 2021-12-03 DIAGNOSIS — Z992 Dependence on renal dialysis: Secondary | ICD-10-CM | POA: Diagnosis not present

## 2021-12-03 DIAGNOSIS — E1129 Type 2 diabetes mellitus with other diabetic kidney complication: Secondary | ICD-10-CM | POA: Diagnosis not present

## 2021-12-03 DIAGNOSIS — D631 Anemia in chronic kidney disease: Secondary | ICD-10-CM | POA: Diagnosis not present

## 2021-12-06 DIAGNOSIS — D509 Iron deficiency anemia, unspecified: Secondary | ICD-10-CM | POA: Diagnosis not present

## 2021-12-06 DIAGNOSIS — N186 End stage renal disease: Secondary | ICD-10-CM | POA: Diagnosis not present

## 2021-12-06 DIAGNOSIS — T8249XA Other complication of vascular dialysis catheter, initial encounter: Secondary | ICD-10-CM | POA: Diagnosis not present

## 2021-12-06 DIAGNOSIS — Z992 Dependence on renal dialysis: Secondary | ICD-10-CM | POA: Diagnosis not present

## 2021-12-06 DIAGNOSIS — R519 Headache, unspecified: Secondary | ICD-10-CM | POA: Diagnosis not present

## 2021-12-06 DIAGNOSIS — D688 Other specified coagulation defects: Secondary | ICD-10-CM | POA: Diagnosis not present

## 2021-12-06 DIAGNOSIS — E1129 Type 2 diabetes mellitus with other diabetic kidney complication: Secondary | ICD-10-CM | POA: Diagnosis not present

## 2021-12-06 DIAGNOSIS — D631 Anemia in chronic kidney disease: Secondary | ICD-10-CM | POA: Diagnosis not present

## 2021-12-06 DIAGNOSIS — N2581 Secondary hyperparathyroidism of renal origin: Secondary | ICD-10-CM | POA: Diagnosis not present

## 2021-12-08 DIAGNOSIS — D688 Other specified coagulation defects: Secondary | ICD-10-CM | POA: Diagnosis not present

## 2021-12-08 DIAGNOSIS — D509 Iron deficiency anemia, unspecified: Secondary | ICD-10-CM | POA: Diagnosis not present

## 2021-12-08 DIAGNOSIS — N2581 Secondary hyperparathyroidism of renal origin: Secondary | ICD-10-CM | POA: Diagnosis not present

## 2021-12-08 DIAGNOSIS — Z992 Dependence on renal dialysis: Secondary | ICD-10-CM | POA: Diagnosis not present

## 2021-12-08 DIAGNOSIS — N186 End stage renal disease: Secondary | ICD-10-CM | POA: Diagnosis not present

## 2021-12-08 DIAGNOSIS — R519 Headache, unspecified: Secondary | ICD-10-CM | POA: Diagnosis not present

## 2021-12-08 DIAGNOSIS — D631 Anemia in chronic kidney disease: Secondary | ICD-10-CM | POA: Diagnosis not present

## 2021-12-08 DIAGNOSIS — T8249XA Other complication of vascular dialysis catheter, initial encounter: Secondary | ICD-10-CM | POA: Diagnosis not present

## 2021-12-08 DIAGNOSIS — E1129 Type 2 diabetes mellitus with other diabetic kidney complication: Secondary | ICD-10-CM | POA: Diagnosis not present

## 2021-12-09 ENCOUNTER — Ambulatory Visit (INDEPENDENT_AMBULATORY_CARE_PROVIDER_SITE_OTHER): Payer: Medicare Other | Admitting: Endocrinology

## 2021-12-09 ENCOUNTER — Other Ambulatory Visit: Payer: Self-pay

## 2021-12-09 ENCOUNTER — Encounter: Payer: Self-pay | Admitting: Endocrinology

## 2021-12-09 VITALS — BP 100/60 | HR 85 | Ht 60.0 in | Wt 195.6 lb

## 2021-12-09 DIAGNOSIS — E1151 Type 2 diabetes mellitus with diabetic peripheral angiopathy without gangrene: Secondary | ICD-10-CM | POA: Diagnosis not present

## 2021-12-09 LAB — POCT GLYCOSYLATED HEMOGLOBIN (HGB A1C): Hemoglobin A1C: 7.7 % — AB (ref 4.0–5.6)

## 2021-12-09 MED ORDER — INSULIN NPH (HUMAN) (ISOPHANE) 100 UNIT/ML ~~LOC~~ SUSP: 15.0000 [IU] | Freq: Every day | SUBCUTANEOUS | 3 refills | Status: DC

## 2021-12-09 MED ORDER — INSULIN REGULAR HUMAN 100 UNIT/ML IJ SOLN: 15.0000 [IU] | Freq: Every day | INTRAMUSCULAR | 3 refills | Status: DC

## 2021-12-09 NOTE — Patient Instructions (Addendum)
check your blood sugar twice a day.  vary the time of day when you check, between before the 3 meals, and at bedtime.  also check if you have symptoms of your blood sugar being too high or too low.  please keep a record of the readings and bring it to your next appointment here (or you can bring the meter itself).  You can write it on any piece of paper.  please call us sooner if your blood sugar goes below 70, or if you have a lot of readings over 200.   Please change the insulins to 15 units of each of these two.  You would take both right before breakfast On this type of insulin schedule, you should eat meals on a regular schedule (especially lunch).  If a meal is missed or significantly delayed, your blood sugar could go low.   Please come back for a follow-up appointment in 3 months.

## 2021-12-09 NOTE — Progress Notes (Signed)
Subjective:    Patient ID: Katie Garcia, female    DOB: [birth year], 80 y.o.   MRN: [id number]  HPI Pt returns for f/u of diabetes mellitus:  DM type: Insulin-requiring type 2 (but vitiligo suggests type 1).   Dx'ed: 8466 Complications: PN, DR, and ESRD (on HD).  Therapy: insulin since 2004.   GDM: 1970 DKA: never.   Severe hypoglycemia: last episode was 2017.    Pancreatitis: never.   Pancreatic imaging: never.  SDOH: she takes human insulin, due to cost.  On HD days (morning), she takes insulin with breakfast when she gets home.   Other: she takes QD insulin, after poor results with multiple daily injections; she was changed to 70/30 qam, due to pattern of cbg's; fructosamine has confirmed A1c; she declines GLP and SGLT Interval history: she has hypoglycemia almost every day, but these episodes are mild; this happens in the middle of the night. no cbg record, but states cbg varies from 50-300.  It is in general higher as the day goes on.  pt states she feels well in general.  Pt says she never misses the insulin.  No recent steroids.  She takes 31 units with breakfast.   Past Medical History:  Diagnosis Date   Anemia    Arthritis    Dementia ([place])    Diabetes mellitus without complication ([place])    ESRD    Dialysis T/Th/Sa at [facility]   GERD (gastroesophageal reflux disease)    Gout    Headache    Hypertension    Type 2 diabetes mellitus ([place])     Past Surgical History:  Procedure Laterality Date   ABDOMINAL HYSTERECTOMY     AV FISTULA PLACEMENT Left 10/02/2016   Procedure: ARTERIOVENOUS (AV) FISTULA CREATION LEFT UPPER ARM;  Surgeon: Waynetta Sandy, MD;  Location: [facility];  Service: Vascular;  Laterality: Left;   AV FISTULA PLACEMENT Right 07/14/2020   Procedure: RIGHT ARM ARTERIOVENOUS FISTULA CREATION;  Surgeon: Waynetta Sandy, MD;  Location: [facility];  Service: Vascular;  Laterality: Right;   AV FISTULA PLACEMENT Left 05/16/2021   Procedure: INSERTION OF  LEFT UPPER ARM ARTERIOVENOUS (AV) GORE-TEX GRAFT;  Surgeon: Marty Heck, MD;  Location: [facility];  Service: Vascular;  Laterality: Left;   AV FISTULA PLACEMENT Right 07/13/2021   Procedure: RIGHT ARM ARTERIOVENOUS (AV) FISTULA GRAFT INSERTION;  Surgeon: Waynetta Sandy, MD;  Location: [facility];  Service: Vascular;  Laterality: Right;   [place] Left 05/17/2020   Procedure: Insertion of Left arm arteriovenous gortex graftarm ;  Surgeon: Angelia Mould, MD;  Location: [facility];  Service: Vascular;  Laterality: Left;   CESAREAN SECTION     DIALYSIS/PERMA CATHETER INSERTION     REVISON OF ARTERIOVENOUS FISTULA Right 09/15/2020   Procedure: REVISON OF RIGHT UPPER ARM  ARTERIOVENOUS FISTULA;  Surgeon: Waynetta Sandy, MD;  Location: [facility];  Service: Vascular;  Laterality: Right;   UPPER EXTREMITY VENOGRAPHY Bilateral 04/18/2021   Procedure: UPPER EXTREMITY VENOGRAPHY;  Surgeon: Waynetta Sandy, MD;  Location: [facility];  Service: Cardiovascular;  Laterality: Bilateral;    Social History   Socioeconomic History   Marital status: Married    Spouse name: Not on file   Number of children: Not on file   Years of education: Not on file   Highest education level: Not on file  Occupational History   Not on file  Tobacco Use   Smoking status: Never  Smokeless tobacco: Never  Vaping Use   Vaping Use: Never used  Substance and Sexual Activity   Alcohol use: Not Currently   Drug use: No   Sexual activity: Not on file  Other Topics Concern   Not on file  Social History Narrative   Not on file   Social Determinants of Health   Financial Resource Strain: Not on file  Food Insecurity: Not on file  Transportation Needs: Not on file  Physical Activity: Not on file  Stress: Not on file  Social Connections: Not on file  Intimate Partner Violence: Not on file    Current Outpatient Medications on File Prior to Visit  Medication Sig  Dispense Refill   Accu-Chek FastClix Lancets MISC      ACCU-CHEK GUIDE test strip 1 each by Other route in the morning and at bedtime. E11.22  1   acetaminophen (TYLENOL) 500 MG tablet Take 500-1,000 mg by mouth every 6 (six) hours as needed for moderate pain or headache.     allopurinol (ZYLOPRIM) 100 MG tablet Take 100 mg by mouth every other day.     B Complex-C-Zn-Folic Acid (DIALYVITE 537-HKFE 15) 0.8 MG TABS Take 1 tablet by mouth daily with supper.     Blood Glucose Monitoring Suppl (ACCU-CHEK GUIDE) w/Device KIT      calcium acetate (PHOSLO) 667 MG capsule Take 1,334 mg by mouth 2 (two) times daily with a meal.  3   carvedilol (COREG) 25 MG tablet Take 25 mg by mouth in the morning and at bedtime.      clindamycin (CLEOCIN) 300 MG capsule Take 1 capsule (300 mg total) by mouth 3 (three) times daily. Do not take on morning of hemodialysis treatment days 25 capsule 0   famotidine (PEPCID) 20 MG tablet Take 20 mg by mouth 2 (two) times daily.     guaiFENesin (MUCINEX) 600 MG 12 hr tablet Take 600 mg by mouth daily as needed for to loosen phlegm.     guaifenesin (ROBITUSSIN) 100 MG/5ML syrup Take 100 mg by mouth 3 (three) times daily as needed for cough.     HYDROcodone-acetaminophen (NORCO/VICODIN) 5-325 MG tablet Take 1 tablet by mouth every 6 (six) hours as needed for moderate pain. 20 tablet 0   iron sucrose in sodium chloride 0.9 % 100 mL Iron Sucrose (Venofer)     loratadine (CLARITIN) 10 MG tablet Take 10 mg by mouth daily as needed for allergies or rhinitis.     losartan (COZAAR) 25 MG tablet Take 25 mg by mouth at bedtime.     meclizine (ANTIVERT) 25 MG tablet Take 1 tablet (25 mg total) by mouth 3 (three) times daily as needed for dizziness. 30 tablet 0   Methoxy PEG-Epoetin Beta (MIRCERA IJ) Mircera     Nutritional Supplements (FEEDING SUPPLEMENT, NEPRO CARB STEADY,) LIQD Take 237 mLs by mouth every Tuesday, Thursday, and Saturday at 6 PM.     RELION INSULIN SYR 0.5ML/31G 31G X  5/16" 0.5 ML MISC USE 1 ONCE DAILY     rosuvastatin (CRESTOR) 10 MG tablet Take 10 mg by mouth daily.     Current Facility-Administered Medications on File Prior to Visit  Medication Dose Route Frequency Provider Last Rate Last Admin   0.9 %  sodium chloride infusion  250 mL Intravenous PRN Waynetta Sandy, MD       sodium chloride flush (NS) 0.9 % injection 3 mL  3 mL Intravenous Q12H Waynetta Sandy, MD  sodium chloride flush (NS) 0.9 % injection 3 mL  3 mL Intravenous PRN Waynetta Sandy, MD         Family History  Problem Relation Age of Onset   Diabetes Mother     BP 100/60 (BP Location: Left Arm, Patient Position: Sitting, Cuff Size: Normal)    Pulse 85    Ht 5' (1.524 m)    Wt 195 lb 9.6 oz (88.7 kg)    SpO2 95%    BMI 38.20 kg/m    Review of Systems     Objective:   Physical Exam Pulses: dorsalis pedis intact bilat.   MSK: no deformity of the feet.  CV: no leg edema.  Skin:  no ulcer on the feet.  normal color and temp on the feet.  Neuro: sensation is intact to touch on the feet, but decreased from normal.    Lab Results  Component Value Date   HGBA1C 7.7 (A) 12/09/2021      Assessment & Plan:  Insulin-requiring type 2 DM Hypoglycemia, due to insulin: this limits aggressiveness of glycemic control.  She again declines to add another med.    Patient Instructions  check your blood sugar twice a day.  vary the time of day when you check, between before the 3 meals, and at bedtime.  also check if you have symptoms of your blood sugar being too high or too low.  please keep a record of the readings and bring it to your next appointment here (or you can bring the meter itself).  You can write it on any piece of paper.  please call us sooner if your blood sugar goes below 70, or if you have a lot of readings over 200.   Please change the insulins to 15 units of each of these two.  You would take both right before breakfast On this type  of insulin schedule, you should eat meals on a regular schedule (especially lunch).  If a meal is missed or significantly delayed, your blood sugar could go low.   Please come back for a follow-up appointment in 3 months.

## 2021-12-10 DIAGNOSIS — D688 Other specified coagulation defects: Secondary | ICD-10-CM | POA: Diagnosis not present

## 2021-12-10 DIAGNOSIS — N186 End stage renal disease: Secondary | ICD-10-CM | POA: Diagnosis not present

## 2021-12-10 DIAGNOSIS — T8249XA Other complication of vascular dialysis catheter, initial encounter: Secondary | ICD-10-CM | POA: Diagnosis not present

## 2021-12-10 DIAGNOSIS — D509 Iron deficiency anemia, unspecified: Secondary | ICD-10-CM | POA: Diagnosis not present

## 2021-12-10 DIAGNOSIS — Z992 Dependence on renal dialysis: Secondary | ICD-10-CM | POA: Diagnosis not present

## 2021-12-10 DIAGNOSIS — E1129 Type 2 diabetes mellitus with other diabetic kidney complication: Secondary | ICD-10-CM | POA: Diagnosis not present

## 2021-12-10 DIAGNOSIS — R519 Headache, unspecified: Secondary | ICD-10-CM | POA: Diagnosis not present

## 2021-12-10 DIAGNOSIS — N2581 Secondary hyperparathyroidism of renal origin: Secondary | ICD-10-CM | POA: Diagnosis not present

## 2021-12-10 DIAGNOSIS — D631 Anemia in chronic kidney disease: Secondary | ICD-10-CM | POA: Diagnosis not present

## 2021-12-13 DIAGNOSIS — R519 Headache, unspecified: Secondary | ICD-10-CM | POA: Diagnosis not present

## 2021-12-13 DIAGNOSIS — T8249XA Other complication of vascular dialysis catheter, initial encounter: Secondary | ICD-10-CM | POA: Diagnosis not present

## 2021-12-13 DIAGNOSIS — N2581 Secondary hyperparathyroidism of renal origin: Secondary | ICD-10-CM | POA: Diagnosis not present

## 2021-12-13 DIAGNOSIS — N186 End stage renal disease: Secondary | ICD-10-CM | POA: Diagnosis not present

## 2021-12-13 DIAGNOSIS — D688 Other specified coagulation defects: Secondary | ICD-10-CM | POA: Diagnosis not present

## 2021-12-13 DIAGNOSIS — D631 Anemia in chronic kidney disease: Secondary | ICD-10-CM | POA: Diagnosis not present

## 2021-12-13 DIAGNOSIS — E1129 Type 2 diabetes mellitus with other diabetic kidney complication: Secondary | ICD-10-CM | POA: Diagnosis not present

## 2021-12-13 DIAGNOSIS — Z992 Dependence on renal dialysis: Secondary | ICD-10-CM | POA: Diagnosis not present

## 2021-12-13 DIAGNOSIS — D509 Iron deficiency anemia, unspecified: Secondary | ICD-10-CM | POA: Diagnosis not present

## 2021-12-14 ENCOUNTER — Ambulatory Visit (INDEPENDENT_AMBULATORY_CARE_PROVIDER_SITE_OTHER): Payer: Medicare Other | Admitting: Podiatry

## 2021-12-14 ENCOUNTER — Other Ambulatory Visit: Payer: Self-pay

## 2021-12-14 ENCOUNTER — Encounter: Payer: Self-pay | Admitting: Podiatry

## 2021-12-14 DIAGNOSIS — M79674 Pain in right toe(s): Secondary | ICD-10-CM | POA: Diagnosis not present

## 2021-12-14 DIAGNOSIS — N186 End stage renal disease: Secondary | ICD-10-CM | POA: Diagnosis not present

## 2021-12-14 DIAGNOSIS — K8020 Calculus of gallbladder without cholecystitis without obstruction: Secondary | ICD-10-CM | POA: Insufficient documentation

## 2021-12-14 DIAGNOSIS — B351 Tinea unguium: Secondary | ICD-10-CM | POA: Diagnosis not present

## 2021-12-14 DIAGNOSIS — Z794 Long term (current) use of insulin: Secondary | ICD-10-CM | POA: Diagnosis not present

## 2021-12-14 DIAGNOSIS — M79675 Pain in left toe(s): Secondary | ICD-10-CM

## 2021-12-14 DIAGNOSIS — Z992 Dependence on renal dialysis: Secondary | ICD-10-CM | POA: Diagnosis not present

## 2021-12-14 DIAGNOSIS — E0822 Diabetes mellitus due to underlying condition with diabetic chronic kidney disease: Secondary | ICD-10-CM

## 2021-12-14 DIAGNOSIS — L84 Corns and callosities: Secondary | ICD-10-CM

## 2021-12-14 DIAGNOSIS — I700 Atherosclerosis of aorta: Secondary | ICD-10-CM | POA: Insufficient documentation

## 2021-12-15 DIAGNOSIS — E1129 Type 2 diabetes mellitus with other diabetic kidney complication: Secondary | ICD-10-CM | POA: Diagnosis not present

## 2021-12-15 DIAGNOSIS — N2581 Secondary hyperparathyroidism of renal origin: Secondary | ICD-10-CM | POA: Diagnosis not present

## 2021-12-15 DIAGNOSIS — R519 Headache, unspecified: Secondary | ICD-10-CM | POA: Diagnosis not present

## 2021-12-15 DIAGNOSIS — N186 End stage renal disease: Secondary | ICD-10-CM | POA: Diagnosis not present

## 2021-12-15 DIAGNOSIS — T8249XA Other complication of vascular dialysis catheter, initial encounter: Secondary | ICD-10-CM | POA: Diagnosis not present

## 2021-12-15 DIAGNOSIS — Z992 Dependence on renal dialysis: Secondary | ICD-10-CM | POA: Diagnosis not present

## 2021-12-15 DIAGNOSIS — T82868A Thrombosis of vascular prosthetic devices, implants and grafts, initial encounter: Secondary | ICD-10-CM | POA: Diagnosis not present

## 2021-12-15 DIAGNOSIS — D509 Iron deficiency anemia, unspecified: Secondary | ICD-10-CM | POA: Diagnosis not present

## 2021-12-15 DIAGNOSIS — D688 Other specified coagulation defects: Secondary | ICD-10-CM | POA: Diagnosis not present

## 2021-12-15 DIAGNOSIS — D631 Anemia in chronic kidney disease: Secondary | ICD-10-CM | POA: Diagnosis not present

## 2021-12-17 DIAGNOSIS — R519 Headache, unspecified: Secondary | ICD-10-CM | POA: Diagnosis not present

## 2021-12-17 DIAGNOSIS — D509 Iron deficiency anemia, unspecified: Secondary | ICD-10-CM | POA: Diagnosis not present

## 2021-12-17 DIAGNOSIS — D631 Anemia in chronic kidney disease: Secondary | ICD-10-CM | POA: Diagnosis not present

## 2021-12-17 DIAGNOSIS — E1129 Type 2 diabetes mellitus with other diabetic kidney complication: Secondary | ICD-10-CM | POA: Diagnosis not present

## 2021-12-17 DIAGNOSIS — Z992 Dependence on renal dialysis: Secondary | ICD-10-CM | POA: Diagnosis not present

## 2021-12-17 DIAGNOSIS — T8249XA Other complication of vascular dialysis catheter, initial encounter: Secondary | ICD-10-CM | POA: Diagnosis not present

## 2021-12-17 DIAGNOSIS — N186 End stage renal disease: Secondary | ICD-10-CM | POA: Diagnosis not present

## 2021-12-17 DIAGNOSIS — D688 Other specified coagulation defects: Secondary | ICD-10-CM | POA: Diagnosis not present

## 2021-12-17 DIAGNOSIS — N2581 Secondary hyperparathyroidism of renal origin: Secondary | ICD-10-CM | POA: Diagnosis not present

## 2021-12-20 DIAGNOSIS — T8249XA Other complication of vascular dialysis catheter, initial encounter: Secondary | ICD-10-CM | POA: Diagnosis not present

## 2021-12-20 DIAGNOSIS — D509 Iron deficiency anemia, unspecified: Secondary | ICD-10-CM | POA: Diagnosis not present

## 2021-12-20 DIAGNOSIS — R519 Headache, unspecified: Secondary | ICD-10-CM | POA: Diagnosis not present

## 2021-12-20 DIAGNOSIS — D688 Other specified coagulation defects: Secondary | ICD-10-CM | POA: Diagnosis not present

## 2021-12-20 DIAGNOSIS — N2581 Secondary hyperparathyroidism of renal origin: Secondary | ICD-10-CM | POA: Diagnosis not present

## 2021-12-20 DIAGNOSIS — E1129 Type 2 diabetes mellitus with other diabetic kidney complication: Secondary | ICD-10-CM | POA: Diagnosis not present

## 2021-12-20 DIAGNOSIS — D631 Anemia in chronic kidney disease: Secondary | ICD-10-CM | POA: Diagnosis not present

## 2021-12-20 DIAGNOSIS — Z992 Dependence on renal dialysis: Secondary | ICD-10-CM | POA: Diagnosis not present

## 2021-12-20 DIAGNOSIS — N186 End stage renal disease: Secondary | ICD-10-CM | POA: Diagnosis not present

## 2021-12-20 NOTE — Progress Notes (Signed)
°  Subjective:  Patient ID: Katie Garcia, female    DOB: 1941-06-04,  MRN: [id number]  Katie Garcia presents to clinic today for at risk foot care. Patient has h/o ESRD on hemodialysis and callus(es) bilaterally and painful thick toenails that are difficult to trim. Painful toenails interfere with ambulation. Aggravating factors include wearing enclosed shoe gear. Pain is relieved with periodic professional debridement. Painful calluses are aggravated when weightbearing with and without shoegear. Pain is relieved with periodic professional debridement.  Patient states blood glucose was 155 mg/dl today.   Dialysis days are TTS.  PCP is [name], MD , and last visit was 12/02/2021.  Allergies  Allergen Reactions   Penicillins Rash    Did it involve swelling of the face/tongue/throat, SOB, or low BP? No Did it involve sudden or severe rash/hives, skin peeling, or any reaction on the inside of your mouth or nose? Yes Did you need to seek medical attention at a hospital or doctor's office? N/A When did it last happen? N/A      If all above answers are "NO", may proceed with cephalosporin use.    Review of Systems: Negative except as noted in the HPI. Objective:   Constitutional Katie Garcia is a pleasant 80 y.o. African American female, obese in NAD. AAO x 3.   Vascular CFT <3 seconds b/l LE. Palpable DP pulse(s) b/l LE. Faintly palpable PT pulse(s) b/l LE. Pedal hair absent. No pain with calf compression b/l. Trace edema noted BLE.  Neurologic Normal speech. Oriented to person, place, and time. Protective sensation intact 5/5 intact bilaterally with 10g monofilament b/l. Vibratory sensation intact b/l.  Dermatologic Pedal integument with normal turgor, texture and tone b/l LE. No open wounds b/l. No interdigital macerations b/l. Toenails 1-5 b/l elongated, thickened, discolored with subungual debris. +Tenderness with dorsal palpation of nailplates. Hyperkeratotic lesion(s)  noted submet head 5 b/l.  Orthopedic: Normal muscle strength 5/5 to all lower extremity muscle groups bilaterally. Pes planus deformity noted bilateral LE.Marland Kitchen No pain, crepitus or joint limitation noted with ROM b/l LE.  Patient ambulates independently without assistive aids.   Radiographs: None  Last A1c:  Hemoglobin A1C Latest Ref Rng & Units 12/09/2021 07/08/2021 05/04/2021  HGBA1C 4.0 - 5.6 % 7.7(A) 8.3(A) 9.1(A)  Some recent data might be hidden     Assessment:   1. Pain due to onychomycosis of toenails of both feet   2. Callus   3. Diabetes mellitus due to underlying condition with chronic kidney disease on chronic dialysis, with long-term current use of insulin ([place])    Plan:  Patient was evaluated and treated and all questions answered. Consent given for treatment as described below: -Continue foot and shoe inspections daily. Monitor blood glucose per PCP/Endocrinologist's recommendations. -Mycotic toenails 1-5 bilaterally were debrided in length and girth with sterile nail nippers and dremel without incident. -Callus(es) submet head 5 b/l pared utilizing sterile scalpel blade without complication or incident. Total number debrided =2. -Patient/POA to call should there be question/concern in the interim.  Return in about 3 months (around 03/14/2022).  Marzetta Board, DPM

## 2021-12-22 DIAGNOSIS — Z992 Dependence on renal dialysis: Secondary | ICD-10-CM | POA: Diagnosis not present

## 2021-12-22 DIAGNOSIS — N186 End stage renal disease: Secondary | ICD-10-CM | POA: Diagnosis not present

## 2021-12-22 DIAGNOSIS — D631 Anemia in chronic kidney disease: Secondary | ICD-10-CM | POA: Diagnosis not present

## 2021-12-22 DIAGNOSIS — T8249XA Other complication of vascular dialysis catheter, initial encounter: Secondary | ICD-10-CM | POA: Diagnosis not present

## 2021-12-22 DIAGNOSIS — N2581 Secondary hyperparathyroidism of renal origin: Secondary | ICD-10-CM | POA: Diagnosis not present

## 2021-12-22 DIAGNOSIS — R519 Headache, unspecified: Secondary | ICD-10-CM | POA: Diagnosis not present

## 2021-12-22 DIAGNOSIS — D688 Other specified coagulation defects: Secondary | ICD-10-CM | POA: Diagnosis not present

## 2021-12-22 DIAGNOSIS — D509 Iron deficiency anemia, unspecified: Secondary | ICD-10-CM | POA: Diagnosis not present

## 2021-12-22 DIAGNOSIS — E1129 Type 2 diabetes mellitus with other diabetic kidney complication: Secondary | ICD-10-CM | POA: Diagnosis not present

## 2021-12-24 DIAGNOSIS — D509 Iron deficiency anemia, unspecified: Secondary | ICD-10-CM | POA: Diagnosis not present

## 2021-12-24 DIAGNOSIS — Z992 Dependence on renal dialysis: Secondary | ICD-10-CM | POA: Diagnosis not present

## 2021-12-24 DIAGNOSIS — N186 End stage renal disease: Secondary | ICD-10-CM | POA: Diagnosis not present

## 2021-12-24 DIAGNOSIS — T8249XA Other complication of vascular dialysis catheter, initial encounter: Secondary | ICD-10-CM | POA: Diagnosis not present

## 2021-12-24 DIAGNOSIS — R519 Headache, unspecified: Secondary | ICD-10-CM | POA: Diagnosis not present

## 2021-12-24 DIAGNOSIS — N2581 Secondary hyperparathyroidism of renal origin: Secondary | ICD-10-CM | POA: Diagnosis not present

## 2021-12-24 DIAGNOSIS — D688 Other specified coagulation defects: Secondary | ICD-10-CM | POA: Diagnosis not present

## 2021-12-24 DIAGNOSIS — E1122 Type 2 diabetes mellitus with diabetic chronic kidney disease: Secondary | ICD-10-CM | POA: Diagnosis not present

## 2021-12-24 DIAGNOSIS — E1129 Type 2 diabetes mellitus with other diabetic kidney complication: Secondary | ICD-10-CM | POA: Diagnosis not present

## 2021-12-24 DIAGNOSIS — D631 Anemia in chronic kidney disease: Secondary | ICD-10-CM | POA: Diagnosis not present

## 2021-12-27 DIAGNOSIS — Z992 Dependence on renal dialysis: Secondary | ICD-10-CM | POA: Diagnosis not present

## 2021-12-27 DIAGNOSIS — D688 Other specified coagulation defects: Secondary | ICD-10-CM | POA: Diagnosis not present

## 2021-12-27 DIAGNOSIS — E1129 Type 2 diabetes mellitus with other diabetic kidney complication: Secondary | ICD-10-CM | POA: Diagnosis not present

## 2021-12-27 DIAGNOSIS — N2581 Secondary hyperparathyroidism of renal origin: Secondary | ICD-10-CM | POA: Diagnosis not present

## 2021-12-27 DIAGNOSIS — T8249XA Other complication of vascular dialysis catheter, initial encounter: Secondary | ICD-10-CM | POA: Diagnosis not present

## 2021-12-27 DIAGNOSIS — R519 Headache, unspecified: Secondary | ICD-10-CM | POA: Diagnosis not present

## 2021-12-27 DIAGNOSIS — D509 Iron deficiency anemia, unspecified: Secondary | ICD-10-CM | POA: Diagnosis not present

## 2021-12-27 DIAGNOSIS — D631 Anemia in chronic kidney disease: Secondary | ICD-10-CM | POA: Diagnosis not present

## 2021-12-27 DIAGNOSIS — N186 End stage renal disease: Secondary | ICD-10-CM | POA: Diagnosis not present

## 2021-12-29 DIAGNOSIS — E1129 Type 2 diabetes mellitus with other diabetic kidney complication: Secondary | ICD-10-CM | POA: Diagnosis not present

## 2021-12-29 DIAGNOSIS — D688 Other specified coagulation defects: Secondary | ICD-10-CM | POA: Diagnosis not present

## 2021-12-29 DIAGNOSIS — D509 Iron deficiency anemia, unspecified: Secondary | ICD-10-CM | POA: Diagnosis not present

## 2021-12-29 DIAGNOSIS — Z992 Dependence on renal dialysis: Secondary | ICD-10-CM | POA: Diagnosis not present

## 2021-12-29 DIAGNOSIS — N2581 Secondary hyperparathyroidism of renal origin: Secondary | ICD-10-CM | POA: Diagnosis not present

## 2021-12-29 DIAGNOSIS — N186 End stage renal disease: Secondary | ICD-10-CM | POA: Diagnosis not present

## 2021-12-29 DIAGNOSIS — R519 Headache, unspecified: Secondary | ICD-10-CM | POA: Diagnosis not present

## 2021-12-29 DIAGNOSIS — Z23 Encounter for immunization: Secondary | ICD-10-CM | POA: Diagnosis not present

## 2021-12-29 DIAGNOSIS — T8249XA Other complication of vascular dialysis catheter, initial encounter: Secondary | ICD-10-CM | POA: Diagnosis not present

## 2021-12-29 DIAGNOSIS — D631 Anemia in chronic kidney disease: Secondary | ICD-10-CM | POA: Diagnosis not present

## 2021-12-30 DIAGNOSIS — H25811 Combined forms of age-related cataract, right eye: Secondary | ICD-10-CM | POA: Diagnosis not present

## 2021-12-30 DIAGNOSIS — Z961 Presence of intraocular lens: Secondary | ICD-10-CM | POA: Diagnosis not present

## 2021-12-30 DIAGNOSIS — H401131 Primary open-angle glaucoma, bilateral, mild stage: Secondary | ICD-10-CM | POA: Diagnosis not present

## 2021-12-31 DIAGNOSIS — R519 Headache, unspecified: Secondary | ICD-10-CM | POA: Diagnosis not present

## 2021-12-31 DIAGNOSIS — D631 Anemia in chronic kidney disease: Secondary | ICD-10-CM | POA: Diagnosis not present

## 2021-12-31 DIAGNOSIS — N186 End stage renal disease: Secondary | ICD-10-CM | POA: Diagnosis not present

## 2021-12-31 DIAGNOSIS — E1129 Type 2 diabetes mellitus with other diabetic kidney complication: Secondary | ICD-10-CM | POA: Diagnosis not present

## 2021-12-31 DIAGNOSIS — N2581 Secondary hyperparathyroidism of renal origin: Secondary | ICD-10-CM | POA: Diagnosis not present

## 2021-12-31 DIAGNOSIS — Z992 Dependence on renal dialysis: Secondary | ICD-10-CM | POA: Diagnosis not present

## 2021-12-31 DIAGNOSIS — T8249XA Other complication of vascular dialysis catheter, initial encounter: Secondary | ICD-10-CM | POA: Diagnosis not present

## 2021-12-31 DIAGNOSIS — D688 Other specified coagulation defects: Secondary | ICD-10-CM | POA: Diagnosis not present

## 2021-12-31 DIAGNOSIS — D509 Iron deficiency anemia, unspecified: Secondary | ICD-10-CM | POA: Diagnosis not present

## 2022-01-03 DIAGNOSIS — T8249XA Other complication of vascular dialysis catheter, initial encounter: Secondary | ICD-10-CM | POA: Diagnosis not present

## 2022-01-03 DIAGNOSIS — R519 Headache, unspecified: Secondary | ICD-10-CM | POA: Diagnosis not present

## 2022-01-03 DIAGNOSIS — Z992 Dependence on renal dialysis: Secondary | ICD-10-CM | POA: Diagnosis not present

## 2022-01-03 DIAGNOSIS — D631 Anemia in chronic kidney disease: Secondary | ICD-10-CM | POA: Diagnosis not present

## 2022-01-03 DIAGNOSIS — E1129 Type 2 diabetes mellitus with other diabetic kidney complication: Secondary | ICD-10-CM | POA: Diagnosis not present

## 2022-01-03 DIAGNOSIS — D688 Other specified coagulation defects: Secondary | ICD-10-CM | POA: Diagnosis not present

## 2022-01-03 DIAGNOSIS — N186 End stage renal disease: Secondary | ICD-10-CM | POA: Diagnosis not present

## 2022-01-03 DIAGNOSIS — N2581 Secondary hyperparathyroidism of renal origin: Secondary | ICD-10-CM | POA: Diagnosis not present

## 2022-01-03 DIAGNOSIS — D509 Iron deficiency anemia, unspecified: Secondary | ICD-10-CM | POA: Diagnosis not present

## 2022-01-04 ENCOUNTER — Other Ambulatory Visit: Payer: Self-pay

## 2022-01-04 DIAGNOSIS — N186 End stage renal disease: Secondary | ICD-10-CM

## 2022-01-05 DIAGNOSIS — R519 Headache, unspecified: Secondary | ICD-10-CM | POA: Diagnosis not present

## 2022-01-05 DIAGNOSIS — E1129 Type 2 diabetes mellitus with other diabetic kidney complication: Secondary | ICD-10-CM | POA: Diagnosis not present

## 2022-01-05 DIAGNOSIS — Z992 Dependence on renal dialysis: Secondary | ICD-10-CM | POA: Diagnosis not present

## 2022-01-05 DIAGNOSIS — D509 Iron deficiency anemia, unspecified: Secondary | ICD-10-CM | POA: Diagnosis not present

## 2022-01-05 DIAGNOSIS — D688 Other specified coagulation defects: Secondary | ICD-10-CM | POA: Diagnosis not present

## 2022-01-05 DIAGNOSIS — N186 End stage renal disease: Secondary | ICD-10-CM | POA: Diagnosis not present

## 2022-01-05 DIAGNOSIS — T8249XA Other complication of vascular dialysis catheter, initial encounter: Secondary | ICD-10-CM | POA: Diagnosis not present

## 2022-01-05 DIAGNOSIS — N2581 Secondary hyperparathyroidism of renal origin: Secondary | ICD-10-CM | POA: Diagnosis not present

## 2022-01-05 DIAGNOSIS — D631 Anemia in chronic kidney disease: Secondary | ICD-10-CM | POA: Diagnosis not present

## 2022-01-06 DIAGNOSIS — H25811 Combined forms of age-related cataract, right eye: Secondary | ICD-10-CM | POA: Diagnosis not present

## 2022-01-06 DIAGNOSIS — H401131 Primary open-angle glaucoma, bilateral, mild stage: Secondary | ICD-10-CM | POA: Diagnosis not present

## 2022-01-06 DIAGNOSIS — H401111 Primary open-angle glaucoma, right eye, mild stage: Secondary | ICD-10-CM | POA: Diagnosis not present

## 2022-01-07 DIAGNOSIS — T8249XA Other complication of vascular dialysis catheter, initial encounter: Secondary | ICD-10-CM | POA: Diagnosis not present

## 2022-01-07 DIAGNOSIS — N186 End stage renal disease: Secondary | ICD-10-CM | POA: Diagnosis not present

## 2022-01-07 DIAGNOSIS — R519 Headache, unspecified: Secondary | ICD-10-CM | POA: Diagnosis not present

## 2022-01-07 DIAGNOSIS — D688 Other specified coagulation defects: Secondary | ICD-10-CM | POA: Diagnosis not present

## 2022-01-07 DIAGNOSIS — E1129 Type 2 diabetes mellitus with other diabetic kidney complication: Secondary | ICD-10-CM | POA: Diagnosis not present

## 2022-01-07 DIAGNOSIS — Z992 Dependence on renal dialysis: Secondary | ICD-10-CM | POA: Diagnosis not present

## 2022-01-07 DIAGNOSIS — D509 Iron deficiency anemia, unspecified: Secondary | ICD-10-CM | POA: Diagnosis not present

## 2022-01-07 DIAGNOSIS — N2581 Secondary hyperparathyroidism of renal origin: Secondary | ICD-10-CM | POA: Diagnosis not present

## 2022-01-07 DIAGNOSIS — D631 Anemia in chronic kidney disease: Secondary | ICD-10-CM | POA: Diagnosis not present

## 2022-01-10 DIAGNOSIS — T8249XA Other complication of vascular dialysis catheter, initial encounter: Secondary | ICD-10-CM | POA: Diagnosis not present

## 2022-01-10 DIAGNOSIS — J45901 Unspecified asthma with (acute) exacerbation: Secondary | ICD-10-CM | POA: Diagnosis not present

## 2022-01-10 DIAGNOSIS — K219 Gastro-esophageal reflux disease without esophagitis: Secondary | ICD-10-CM | POA: Diagnosis not present

## 2022-01-10 DIAGNOSIS — E1129 Type 2 diabetes mellitus with other diabetic kidney complication: Secondary | ICD-10-CM | POA: Diagnosis not present

## 2022-01-10 DIAGNOSIS — I129 Hypertensive chronic kidney disease with stage 1 through stage 4 chronic kidney disease, or unspecified chronic kidney disease: Secondary | ICD-10-CM | POA: Diagnosis not present

## 2022-01-10 DIAGNOSIS — D509 Iron deficiency anemia, unspecified: Secondary | ICD-10-CM | POA: Diagnosis not present

## 2022-01-10 DIAGNOSIS — D631 Anemia in chronic kidney disease: Secondary | ICD-10-CM | POA: Diagnosis not present

## 2022-01-10 DIAGNOSIS — N2581 Secondary hyperparathyroidism of renal origin: Secondary | ICD-10-CM | POA: Diagnosis not present

## 2022-01-10 DIAGNOSIS — Z992 Dependence on renal dialysis: Secondary | ICD-10-CM | POA: Diagnosis not present

## 2022-01-10 DIAGNOSIS — E785 Hyperlipidemia, unspecified: Secondary | ICD-10-CM | POA: Diagnosis not present

## 2022-01-10 DIAGNOSIS — R519 Headache, unspecified: Secondary | ICD-10-CM | POA: Diagnosis not present

## 2022-01-10 DIAGNOSIS — D688 Other specified coagulation defects: Secondary | ICD-10-CM | POA: Diagnosis not present

## 2022-01-10 DIAGNOSIS — I10 Essential (primary) hypertension: Secondary | ICD-10-CM | POA: Diagnosis not present

## 2022-01-10 DIAGNOSIS — N186 End stage renal disease: Secondary | ICD-10-CM | POA: Diagnosis not present

## 2022-01-10 DIAGNOSIS — E1122 Type 2 diabetes mellitus with diabetic chronic kidney disease: Secondary | ICD-10-CM | POA: Diagnosis not present

## 2022-01-12 DIAGNOSIS — R519 Headache, unspecified: Secondary | ICD-10-CM | POA: Diagnosis not present

## 2022-01-12 DIAGNOSIS — E1129 Type 2 diabetes mellitus with other diabetic kidney complication: Secondary | ICD-10-CM | POA: Diagnosis not present

## 2022-01-12 DIAGNOSIS — D631 Anemia in chronic kidney disease: Secondary | ICD-10-CM | POA: Diagnosis not present

## 2022-01-12 DIAGNOSIS — N186 End stage renal disease: Secondary | ICD-10-CM | POA: Diagnosis not present

## 2022-01-12 DIAGNOSIS — N2581 Secondary hyperparathyroidism of renal origin: Secondary | ICD-10-CM | POA: Diagnosis not present

## 2022-01-12 DIAGNOSIS — D688 Other specified coagulation defects: Secondary | ICD-10-CM | POA: Diagnosis not present

## 2022-01-12 DIAGNOSIS — T8249XA Other complication of vascular dialysis catheter, initial encounter: Secondary | ICD-10-CM | POA: Diagnosis not present

## 2022-01-12 DIAGNOSIS — Z992 Dependence on renal dialysis: Secondary | ICD-10-CM | POA: Diagnosis not present

## 2022-01-12 DIAGNOSIS — D509 Iron deficiency anemia, unspecified: Secondary | ICD-10-CM | POA: Diagnosis not present

## 2022-01-14 DIAGNOSIS — Z992 Dependence on renal dialysis: Secondary | ICD-10-CM | POA: Diagnosis not present

## 2022-01-14 DIAGNOSIS — D688 Other specified coagulation defects: Secondary | ICD-10-CM | POA: Diagnosis not present

## 2022-01-14 DIAGNOSIS — D509 Iron deficiency anemia, unspecified: Secondary | ICD-10-CM | POA: Diagnosis not present

## 2022-01-14 DIAGNOSIS — D631 Anemia in chronic kidney disease: Secondary | ICD-10-CM | POA: Diagnosis not present

## 2022-01-14 DIAGNOSIS — R519 Headache, unspecified: Secondary | ICD-10-CM | POA: Diagnosis not present

## 2022-01-14 DIAGNOSIS — N186 End stage renal disease: Secondary | ICD-10-CM | POA: Diagnosis not present

## 2022-01-14 DIAGNOSIS — N2581 Secondary hyperparathyroidism of renal origin: Secondary | ICD-10-CM | POA: Diagnosis not present

## 2022-01-14 DIAGNOSIS — E1129 Type 2 diabetes mellitus with other diabetic kidney complication: Secondary | ICD-10-CM | POA: Diagnosis not present

## 2022-01-14 DIAGNOSIS — T8249XA Other complication of vascular dialysis catheter, initial encounter: Secondary | ICD-10-CM | POA: Diagnosis not present

## 2022-01-16 ENCOUNTER — Ambulatory Visit (HOSPITAL_COMMUNITY)
Admission: RE | Admit: 2022-01-16 | Discharge: 2022-01-16 | Disposition: A | Discharge status: Home / Self Care | Payer: Medicare Other | Source: Ambulatory Visit | Attending: Surgery | Admitting: Surgery

## 2022-01-16 ENCOUNTER — Other Ambulatory Visit: Payer: Self-pay

## 2022-01-16 ENCOUNTER — Ambulatory Visit (INDEPENDENT_AMBULATORY_CARE_PROVIDER_SITE_OTHER)
Admission: RE | Admit: 2022-01-16 | Discharge: 2022-01-16 | Disposition: A | Discharge status: Home / Self Care | Payer: Medicare Other | Source: Ambulatory Visit | Attending: Surgery | Admitting: Surgery

## 2022-01-16 DIAGNOSIS — N186 End stage renal disease: Secondary | ICD-10-CM

## 2022-01-17 DIAGNOSIS — Z992 Dependence on renal dialysis: Secondary | ICD-10-CM | POA: Diagnosis not present

## 2022-01-17 DIAGNOSIS — D688 Other specified coagulation defects: Secondary | ICD-10-CM | POA: Diagnosis not present

## 2022-01-17 DIAGNOSIS — T8249XA Other complication of vascular dialysis catheter, initial encounter: Secondary | ICD-10-CM | POA: Diagnosis not present

## 2022-01-17 DIAGNOSIS — N2581 Secondary hyperparathyroidism of renal origin: Secondary | ICD-10-CM | POA: Diagnosis not present

## 2022-01-17 DIAGNOSIS — N186 End stage renal disease: Secondary | ICD-10-CM | POA: Diagnosis not present

## 2022-01-17 DIAGNOSIS — D509 Iron deficiency anemia, unspecified: Secondary | ICD-10-CM | POA: Diagnosis not present

## 2022-01-17 DIAGNOSIS — E1129 Type 2 diabetes mellitus with other diabetic kidney complication: Secondary | ICD-10-CM | POA: Diagnosis not present

## 2022-01-17 DIAGNOSIS — D631 Anemia in chronic kidney disease: Secondary | ICD-10-CM | POA: Diagnosis not present

## 2022-01-17 DIAGNOSIS — R519 Headache, unspecified: Secondary | ICD-10-CM | POA: Diagnosis not present

## 2022-01-18 ENCOUNTER — Ambulatory Visit: Payer: Medicare Other | Admitting: Vascular Surgery

## 2022-01-18 ENCOUNTER — Other Ambulatory Visit: Payer: Self-pay

## 2022-01-18 ENCOUNTER — Encounter: Payer: Self-pay | Admitting: Vascular Surgery

## 2022-01-18 VITALS — BP 184/82 | HR 81 | Temp 98.2°F | Resp 20 | Ht 60.0 in | Wt 200.0 lb

## 2022-01-18 DIAGNOSIS — T8249XA Other complication of vascular dialysis catheter, initial encounter: Secondary | ICD-10-CM | POA: Diagnosis not present

## 2022-01-18 DIAGNOSIS — N186 End stage renal disease: Secondary | ICD-10-CM | POA: Diagnosis not present

## 2022-01-18 DIAGNOSIS — Z992 Dependence on renal dialysis: Secondary | ICD-10-CM | POA: Diagnosis not present

## 2022-01-18 NOTE — H&P (View-Only) (Signed)
Patient ID: Katie Garcia, female   DOB: 1941-03-19, 81 y.o.   MRN: [id number]  Reason for Consult: Follow-up   Referred by [name],*  Subjective:     HPI:  Katie Garcia is a 81 y.o. female history of end-stage renal disease.  She has a previous left arm AV fistula.  This subsequently failed she had a right IJ catheter.  We performed upper extremity venography we then placed left arm AV graft which failed shortly thereafter.  She is more recently been dialyzing via right arm AV graft placed last July.  She states that there was a stent in this and she does have previous stenting of her right upper extremity from a fistula there as well.  She now presents for further access.  She does not take any blood thinners.  She has no problems with her hands with sensation and motor intact bilaterally.  Past Medical History:  Diagnosis Date   Anemia    Arthritis    Dementia ([place])    Diabetes mellitus without complication ([place])    ESRD    Dialysis T/Th/Sa at [facility]   GERD (gastroesophageal reflux disease)    Gout    Headache    Hypertension    Type 2 diabetes mellitus ([place])    Family History  Problem Relation Age of Onset   Diabetes Mother    Past Surgical History:  Procedure Laterality Date   ABDOMINAL HYSTERECTOMY     AV FISTULA PLACEMENT Left 10/02/2016   Procedure: ARTERIOVENOUS (AV) FISTULA CREATION LEFT UPPER ARM;  Surgeon: Waynetta Sandy, MD;  Location: [facility];  Service: Vascular;  Laterality: Left;   AV FISTULA PLACEMENT Right 07/14/2020   Procedure: RIGHT ARM ARTERIOVENOUS FISTULA CREATION;  Surgeon: Waynetta Sandy, MD;  Location: [facility];  Service: Vascular;  Laterality: Right;   AV FISTULA PLACEMENT Left 05/16/2021   Procedure: INSERTION OF LEFT UPPER ARM ARTERIOVENOUS (AV) GORE-TEX GRAFT;  Surgeon: Marty Heck, MD;  Location: [facility];  Service: Vascular;  Laterality: Left;   AV FISTULA PLACEMENT Right 07/13/2021   Procedure: RIGHT  ARM ARTERIOVENOUS (AV) FISTULA GRAFT INSERTION;  Surgeon: Waynetta Sandy, MD;  Location: [facility];  Service: Vascular;  Laterality: Right;   [place] Left 05/17/2020   Procedure: Insertion of Left arm arteriovenous gortex graftarm ;  Surgeon: Angelia Mould, MD;  Location: [facility];  Service: Vascular;  Laterality: Left;   CESAREAN SECTION     DIALYSIS/PERMA CATHETER INSERTION     REVISON OF ARTERIOVENOUS FISTULA Right 09/15/2020   Procedure: REVISON OF RIGHT UPPER ARM  ARTERIOVENOUS FISTULA;  Surgeon: Waynetta Sandy, MD;  Location: [facility];  Service: Vascular;  Laterality: Right;   UPPER EXTREMITY VENOGRAPHY Bilateral 04/18/2021   Procedure: UPPER EXTREMITY VENOGRAPHY;  Surgeon: Waynetta Sandy, MD;  Location: [facility];  Service: Cardiovascular;  Laterality: Bilateral;    Short Social History:  Social History   Tobacco Use   Smoking status: Never   Smokeless tobacco: Never  Substance Use Topics   Alcohol use: Not Currently    Allergies  Allergen Reactions   Penicillins Rash    Did it involve swelling of the face/tongue/throat, SOB, or low BP? No Did it involve sudden or severe rash/hives, skin peeling, or any reaction on the inside of your mouth or nose? Yes Did you need to seek medical attention at a hospital or doctor's office? N/A When did it last happen? N/A  If all above answers are "NO", may proceed with cephalosporin use.    Current Outpatient Medications  Medication Sig Dispense Refill   Accu-Chek FastClix Lancets MISC      ACCU-CHEK GUIDE test strip 1 each by Other route in the morning and at bedtime. E11.22  1   acetaminophen (TYLENOL) 500 MG tablet Take 500-1,000 mg by mouth every 6 (six) hours as needed for moderate pain or headache.     allopurinol (ZYLOPRIM) 100 MG tablet Take 100 mg by mouth every other day.     B Complex-C-Zn-Folic Acid (DIALYVITE 254-YHCW 15) 0.8 MG TABS Take 1 tablet by mouth daily  with supper.     Blood Glucose Monitoring Suppl (ACCU-CHEK GUIDE) w/Device KIT      calcium acetate (PHOSLO) 667 MG capsule Take 1,334 mg by mouth 2 (two) times daily with a meal.  3   carvedilol (COREG) 25 MG tablet Take 25 mg by mouth in the morning and at bedtime.      clindamycin (CLEOCIN) 300 MG capsule Take 1 capsule (300 mg total) by mouth 3 (three) times daily. Do not take on morning of hemodialysis treatment days 25 capsule 0   famotidine (PEPCID) 20 MG tablet Take 20 mg by mouth 2 (two) times daily.     guaiFENesin (MUCINEX) 600 MG 12 hr tablet Take 600 mg by mouth daily as needed for to loosen phlegm.     guaifenesin (ROBITUSSIN) 100 MG/5ML syrup Take 100 mg by mouth 3 (three) times daily as needed for cough.     insulin NPH Human (NOVOLIN N) 100 UNIT/ML injection Inject 0.15 mLs (15 Units total) into the skin daily before breakfast. 20 mL 3   insulin regular (NOVOLIN R) 100 units/mL injection Inject 0.15 mLs (15 Units total) into the skin daily. And syringes 1/day 20 mL 3   iron sucrose in sodium chloride 0.9 % 100 mL Iron Sucrose (Venofer)     loratadine (CLARITIN) 10 MG tablet Take 10 mg by mouth daily as needed for allergies or rhinitis.     losartan (COZAAR) 25 MG tablet Take 25 mg by mouth at bedtime.     meclizine (ANTIVERT) 25 MG tablet Take 1 tablet (25 mg total) by mouth 3 (three) times daily as needed for dizziness. 30 tablet 0   Methoxy PEG-Epoetin Beta (MIRCERA IJ) Mircera     Nutritional Supplements (FEEDING SUPPLEMENT, NEPRO CARB STEADY,) LIQD Take 237 mLs by mouth every Tuesday, Thursday, and Saturday at 6 PM.     ofloxacin (OCUFLOX) 0.3 % ophthalmic solution      prednisoLONE acetate (PRED FORTE) 1 % ophthalmic suspension      RELION INSULIN SYR 0.5ML/31G 31G X 5/16" 0.5 ML MISC USE 1 ONCE DAILY     rosuvastatin (CRESTOR) 10 MG tablet Take 10 mg by mouth daily.     Current Facility-Administered Medications  Medication Dose Route Frequency Provider Last Rate Last  Admin   0.9 %  sodium chloride infusion  250 mL Intravenous PRN Waynetta Sandy, MD       sodium chloride flush (NS) 0.9 % injection 3 mL  3 mL Intravenous Q12H Waynetta Sandy, MD       sodium chloride flush (NS) 0.9 % injection 3 mL  3 mL Intravenous PRN Waynetta Sandy, MD        Review of Systems  Constitutional:  Constitutional negative. HENT: HENT negative.  Eyes: Eyes negative.  Respiratory: Respiratory negative.  Cardiovascular: Cardiovascular negative.  GI: Gastrointestinal  negative.  Musculoskeletal: Musculoskeletal negative.  Skin: Skin negative.  Neurological: Neurological negative. Hematologic: Hematologic/lymphatic negative.  Psychiatric: Psychiatric negative.       Objective:  Objective   Vitals:   01/18/22 0825  BP: (!) 184/82  Pulse: 81  Resp: 20  Temp: 98.2 F (36.8 C)  SpO2: 97%  Weight: 200 lb (90.7 kg)  Height: 5' (1.524 m)   Body mass index is 39.06 kg/m.  Physical Exam Constitutional:      Appearance: She is obese.  HENT:     Head: Normocephalic.     Nose:     Comments: Wearing a mask Eyes:     Pupils: Pupils are equal, round, and reactive to light.  Cardiovascular:     Rate and Rhythm: Normal rate.     Pulses:          Radial pulses are 1+ on the right side and 1+ on the left side.       Dorsalis pedis pulses are 0 on the right side and 0 on the left side.       Posterior tibial pulses are 0 on the right side and 0 on the left side.     Comments: Left radial pulse is slightly stronger than right Pulmonary:     Effort: Pulmonary effort is normal.  Abdominal:     General: Abdomen is flat.     Palpations: Abdomen is soft.  Musculoskeletal:        General: Normal range of motion.  Skin:    General: Skin is warm and dry.     Capillary Refill: Capillary refill takes less than 2 seconds.  Neurological:     General: No focal deficit present.     Mental Status: She is alert.  Psychiatric:        Mood and  Affect: Mood normal.        Thought Content: Thought content normal.     Right Cephalic    Diameter (cm) Depth (cm)  Findings    +-----------------+-------------+----------+----------+   Prox upper arm                             thrombosed   +-----------------+-------------+----------+----------+   Mid upper arm                              thrombosed   +-----------------+-------------+----------+----------+   Dist upper arm                             thrombosed   +-----------------+-------------+----------+----------+   Antecubital fossa                          thrombosed   +-----------------+-------------+----------+----------+   Prox forearm                               thrombosed   +-----------------+-------------+----------+----------+   Mid forearm                                thrombosed   +-----------------+-------------+----------+----------+   +-----------------+-------------+----------+--------+   Right Basilic     Diameter (cm) Depth (cm) Findings   +-----------------+-------------+----------+--------+   Prox upper arm  0.31                            +-----------------+-------------+----------+--------+   Mid upper arm         0.28                            +-----------------+-------------+----------+--------+   Dist upper arm        0.31                            +-----------------+-------------+----------+--------+   Antecubital fossa     0.29                            +-----------------+-------------+----------+--------+   Prox forearm          0.17                            +-----------------+-------------+----------+--------+   +-----------------+-------------+----------+----------+   Left Cephalic     Diameter (cm) Depth (cm)  Findings    +-----------------+-------------+----------+----------+   Prox upper arm                             thrombosed   +-----------------+-------------+----------+----------+   Mid upper arm                               thrombosed   +-----------------+-------------+----------+----------+   Dist upper arm                             thrombosed   +-----------------+-------------+----------+----------+   Antecubital fossa                          thrombosed   +-----------------+-------------+----------+----------+   Prox forearm                       0.22                 +-----------------+-------------+----------+----------+   Mid forearm                        0.15                 +-----------------+-------------+----------+----------+   +-----------------+-------------+----------+--------+   Left Basilic      Diameter (cm) Depth (cm) Findings   +-----------------+-------------+----------+--------+   Prox upper arm        0.33                            +-----------------+-------------+----------+--------+   Mid upper arm         0.32                            +-----------------+-------------+----------+--------+   Dist upper arm        0.25                            +-----------------+-------------+----------+--------+   Antecubital fossa     0.23                            +-----------------+-------------+----------+--------+  Prox forearm          0.24                            +-----------------+-------------+----------+--------+   Summary: Right: Cephalic vein appears to be thrombosed (was part of a  prior         AVF.) Non functioning AVGG seen. Basilic vein is patent with         diameters as described above.  Left: Cephalic vein appears to be thrombosed (was part of a prior        AVF.) Non functioning AVGG seen. Basilic vein is patent with        diameters as described above.   Right Pre-Dialysis Findings:  +-----------------------+----------+--------------------+--------+--------+    Location                PSV (cm/s) Intralum. Diam.  (cm) Waveform Comments   +-----------------------+----------+--------------------+--------+--------+    Brachial Antecub. fossa 107        0.44                  biphasic              +-----------------------+----------+--------------------+--------+--------+    Radial Art at Wrist     85         0.21                 biphasic              +-----------------------+----------+--------------------+--------+--------+    Ulnar Art at Wrist      78         0.20                 biphasic              +-----------------------+----------+--------------------+--------+--------+   Left Pre-Dialysis Findings:  +-----------------------+----------+--------------------+--------+--------+    Location                PSV (cm/s) Intralum. Diam.  (cm) Waveform Comments   +-----------------------+----------+--------------------+--------+--------+    Brachial Antecub. fossa 117        0.65                 biphasic              +-----------------------+----------+--------------------+--------+--------+    Radial Art at Wrist     86         0.16                 biphasic              +-----------------------+----------+--------------------+--------+--------+    Ulnar Art at Wrist      92         0.16                 biphasic              +-----------------------+----------+--------------------+--------+--------+       Assessment/Plan:     81 year old female with end-stage renal disease currently dialyzing via left IJ catheter that was placed this morning.  She has failed bilateral upper extremity accesses.  I do not feel pedal pulses and given her body habitus she does not appear to be a good candidate for femoral access.  We will begin with bilateral upper extremity venography which was performed just 9 months ago.  From here we will plan for permanent access.  I discussed this with the patient and her daughter.  She does have a slightly stronger radial pulse on the left as well as a larger brachial artery by arterial duplex may be she would be a better candidate for a left-sided access we may need to consider more advanced access considerations  including hero grafting.  We will plan for venography on a nondialysis day likely Monday in the near future and planned permanent access from there.     Waynetta Sandy MD [facility]

## 2022-01-18 NOTE — Progress Notes (Signed)
Patient ID: Juno Alers Hulse, female   DOB: [birth year], 81 y.o.   MRN: [id number]  Reason for Consult: Follow-up   Referred by [name],*  Subjective:     HPI:  Kayloni Rocco Pancoast is a 81 y.o. female history of end-stage renal disease.  She has a previous left arm AV fistula.  This subsequently failed she had a right IJ catheter.  We performed upper extremity venography we then placed left arm AV graft which failed shortly thereafter.  She is more recently been dialyzing via right arm AV graft placed last July.  She states that there was a stent in this and she does have previous stenting of her right upper extremity from a fistula there as well.  She now presents for further access.  She does not take any blood thinners.  She has no problems with her hands with sensation and motor intact bilaterally.  Past Medical History:  Diagnosis Date   Anemia    Arthritis    Dementia ([place])    Diabetes mellitus without complication ([place])    ESRD    Dialysis T/Th/Sa at [facility]   GERD (gastroesophageal reflux disease)    Gout    Headache    Hypertension    Type 2 diabetes mellitus ([place])    Family History  Problem Relation Age of Onset   Diabetes Mother    Past Surgical History:  Procedure Laterality Date   ABDOMINAL HYSTERECTOMY     AV FISTULA PLACEMENT Left 10/02/2016   Procedure: ARTERIOVENOUS (AV) FISTULA CREATION LEFT UPPER ARM;  Surgeon: Waynetta Sandy, MD;  Location: [facility];  Service: Vascular;  Laterality: Left;   AV FISTULA PLACEMENT Right 07/14/2020   Procedure: RIGHT ARM ARTERIOVENOUS FISTULA CREATION;  Surgeon: Waynetta Sandy, MD;  Location: [place];  Service: Vascular;  Laterality: Right;   AV FISTULA PLACEMENT Left 05/16/2021   Procedure: INSERTION OF LEFT UPPER ARM ARTERIOVENOUS (AV) GORE-TEX GRAFT;  Surgeon: Marty Heck, MD;  Location: [place];  Service: Vascular;  Laterality: Left;   AV FISTULA PLACEMENT Right 07/13/2021   Procedure: RIGHT  ARM ARTERIOVENOUS (AV) FISTULA GRAFT INSERTION;  Surgeon: Waynetta Sandy, MD;  Location: [place];  Service: Vascular;  Laterality: Right;   [place] Left 05/17/2020   Procedure: Insertion of Left arm arteriovenous gortex graftarm ;  Surgeon: Angelia Mould, MD;  Location: [place];  Service: Vascular;  Laterality: Left;   CESAREAN SECTION     DIALYSIS/PERMA CATHETER INSERTION     REVISON OF ARTERIOVENOUS FISTULA Right 09/15/2020   Procedure: REVISON OF RIGHT UPPER ARM  ARTERIOVENOUS FISTULA;  Surgeon: Waynetta Sandy, MD;  Location: [place];  Service: Vascular;  Laterality: Right;   UPPER EXTREMITY VENOGRAPHY Bilateral 04/18/2021   Procedure: UPPER EXTREMITY VENOGRAPHY;  Surgeon: Waynetta Sandy, MD;  Location: [place] [facility];  Service: Cardiovascular;  Laterality: Bilateral;    Short Social History:  Social History   Tobacco Use   Smoking status: Never   Smokeless tobacco: Never  Substance Use Topics   Alcohol use: Not Currently    Allergies  Allergen Reactions   Penicillins Rash    Did it involve swelling of the face/tongue/throat, SOB, or low BP? No Did it involve sudden or severe rash/hives, skin peeling, or any reaction on the inside of your mouth or nose? Yes Did you need to seek medical attention at a hospital or doctor's office? N/A When did it last happen? N/A  If all above answers are "NO", may proceed with cephalosporin use.    Current Outpatient Medications  Medication Sig Dispense Refill   Accu-Chek FastClix Lancets MISC      ACCU-CHEK GUIDE test strip 1 each by Other route in the morning and at bedtime. E11.22  1   acetaminophen (TYLENOL) 500 MG tablet Take 500-1,000 mg by mouth every 6 (six) hours as needed for moderate pain or headache.     allopurinol (ZYLOPRIM) 100 MG tablet Take 100 mg by mouth every other day.     B Complex-C-Zn-Folic Acid (DIALYVITE 295-JOAC 15) 0.8 MG TABS Take 1 tablet by mouth daily  with supper.     Blood Glucose Monitoring Suppl (ACCU-CHEK GUIDE) w/Device KIT      calcium acetate (PHOSLO) 667 MG capsule Take 1,334 mg by mouth 2 (two) times daily with a meal.  3   carvedilol (COREG) 25 MG tablet Take 25 mg by mouth in the morning and at bedtime.      clindamycin (CLEOCIN) 300 MG capsule Take 1 capsule (300 mg total) by mouth 3 (three) times daily. Do not take on morning of hemodialysis treatment days 25 capsule 0   famotidine (PEPCID) 20 MG tablet Take 20 mg by mouth 2 (two) times daily.     guaiFENesin (MUCINEX) 600 MG 12 hr tablet Take 600 mg by mouth daily as needed for to loosen phlegm.     guaifenesin (ROBITUSSIN) 100 MG/5ML syrup Take 100 mg by mouth 3 (three) times daily as needed for cough.     insulin NPH Human (NOVOLIN N) 100 UNIT/ML injection Inject 0.15 mLs (15 Units total) into the skin daily before breakfast. 20 mL 3   insulin regular (NOVOLIN R) 100 units/mL injection Inject 0.15 mLs (15 Units total) into the skin daily. And syringes 1/day 20 mL 3   iron sucrose in sodium chloride 0.9 % 100 mL Iron Sucrose (Venofer)     loratadine (CLARITIN) 10 MG tablet Take 10 mg by mouth daily as needed for allergies or rhinitis.     losartan (COZAAR) 25 MG tablet Take 25 mg by mouth at bedtime.     meclizine (ANTIVERT) 25 MG tablet Take 1 tablet (25 mg total) by mouth 3 (three) times daily as needed for dizziness. 30 tablet 0   Methoxy PEG-Epoetin Beta (MIRCERA IJ) Mircera     Nutritional Supplements (FEEDING SUPPLEMENT, NEPRO CARB STEADY,) LIQD Take 237 mLs by mouth every Tuesday, Thursday, and Saturday at 6 PM.     ofloxacin (OCUFLOX) 0.3 % ophthalmic solution      prednisoLONE acetate (PRED FORTE) 1 % ophthalmic suspension      RELION INSULIN SYR 0.5ML/31G 31G X 5/16" 0.5 ML MISC USE 1 ONCE DAILY     rosuvastatin (CRESTOR) 10 MG tablet Take 10 mg by mouth daily.     Current Facility-Administered Medications  Medication Dose Route Frequency Provider Last Rate Last  Admin   0.9 %  sodium chloride infusion  250 mL Intravenous PRN Waynetta Sandy, MD       sodium chloride flush (NS) 0.9 % injection 3 mL  3 mL Intravenous Q12H Waynetta Sandy, MD       sodium chloride flush (NS) 0.9 % injection 3 mL  3 mL Intravenous PRN Waynetta Sandy, MD        Review of Systems  Constitutional:  Constitutional negative. HENT: HENT negative.  Eyes: Eyes negative.  Respiratory: Respiratory negative.  Cardiovascular: Cardiovascular negative.  GI: Gastrointestinal  negative.  Musculoskeletal: Musculoskeletal negative.  Skin: Skin negative.  Neurological: Neurological negative. Hematologic: Hematologic/lymphatic negative.  Psychiatric: Psychiatric negative.       Objective:  Objective   Vitals:   01/18/22 0825  BP: (!) 184/82  Pulse: 81  Resp: 20  Temp: 98.2 F (36.8 C)  SpO2: 97%  Weight: 200 lb (90.7 kg)  Height: 5' (1.524 m)   Body mass index is 39.06 kg/m.  Physical Exam Constitutional:      Appearance: She is obese.  HENT:     Head: Normocephalic.     Nose:     Comments: Wearing a mask Eyes:     Pupils: Pupils are equal, round, and reactive to light.  Cardiovascular:     Rate and Rhythm: Normal rate.     Pulses:          Radial pulses are 1+ on the right side and 1+ on the left side.       Dorsalis pedis pulses are 0 on the right side and 0 on the left side.       Posterior tibial pulses are 0 on the right side and 0 on the left side.     Comments: Left radial pulse is slightly stronger than right Pulmonary:     Effort: Pulmonary effort is normal.  Abdominal:     General: Abdomen is flat.     Palpations: Abdomen is soft.  Musculoskeletal:        General: Normal range of motion.  Skin:    General: Skin is warm and dry.     Capillary Refill: Capillary refill takes less than 2 seconds.  Neurological:     General: No focal deficit present.     Mental Status: She is alert.  Psychiatric:        Mood and  Affect: Mood normal.        Thought Content: Thought content normal.     Right Cephalic    Diameter (cm) Depth (cm)  Findings    +-----------------+-------------+----------+----------+   Prox upper arm                             thrombosed   +-----------------+-------------+----------+----------+   Mid upper arm                              thrombosed   +-----------------+-------------+----------+----------+   Dist upper arm                             thrombosed   +-----------------+-------------+----------+----------+   Antecubital fossa                          thrombosed   +-----------------+-------------+----------+----------+   Prox forearm                               thrombosed   +-----------------+-------------+----------+----------+   Mid forearm                                thrombosed   +-----------------+-------------+----------+----------+   +-----------------+-------------+----------+--------+   Right Basilic     Diameter (cm) Depth (cm) Findings   +-----------------+-------------+----------+--------+   Prox upper arm  0.31                            +-----------------+-------------+----------+--------+   Mid upper arm         0.28                            +-----------------+-------------+----------+--------+   Dist upper arm        0.31                            +-----------------+-------------+----------+--------+   Antecubital fossa     0.29                            +-----------------+-------------+----------+--------+   Prox forearm          0.17                            +-----------------+-------------+----------+--------+   +-----------------+-------------+----------+----------+   Left Cephalic     Diameter (cm) Depth (cm)  Findings    +-----------------+-------------+----------+----------+   Prox upper arm                             thrombosed   +-----------------+-------------+----------+----------+   Mid upper arm                               thrombosed   +-----------------+-------------+----------+----------+   Dist upper arm                             thrombosed   +-----------------+-------------+----------+----------+   Antecubital fossa                          thrombosed   +-----------------+-------------+----------+----------+   Prox forearm                       0.22                 +-----------------+-------------+----------+----------+   Mid forearm                        0.15                 +-----------------+-------------+----------+----------+   +-----------------+-------------+----------+--------+   Left Basilic      Diameter (cm) Depth (cm) Findings   +-----------------+-------------+----------+--------+   Prox upper arm        0.33                            +-----------------+-------------+----------+--------+   Mid upper arm         0.32                            +-----------------+-------------+----------+--------+   Dist upper arm        0.25                            +-----------------+-------------+----------+--------+   Antecubital fossa     0.23                            +-----------------+-------------+----------+--------+  Prox forearm          0.24                            +-----------------+-------------+----------+--------+   Summary: Right: Cephalic vein appears to be thrombosed (was part of a  prior         AVF.) Non functioning AVGG seen. Basilic vein is patent with         diameters as described above.  Left: Cephalic vein appears to be thrombosed (was part of a prior        AVF.) Non functioning AVGG seen. Basilic vein is patent with        diameters as described above.   Right Pre-Dialysis Findings:  +-----------------------+----------+--------------------+--------+--------+    Location                PSV (cm/s) Intralum. Diam.  (cm) Waveform Comments   +-----------------------+----------+--------------------+--------+--------+    Brachial Antecub. fossa 107        0.44                  biphasic              +-----------------------+----------+--------------------+--------+--------+    Radial Art at Wrist     85         0.21                 biphasic              +-----------------------+----------+--------------------+--------+--------+    Ulnar Art at Wrist      78         0.20                 biphasic              +-----------------------+----------+--------------------+--------+--------+   Left Pre-Dialysis Findings:  +-----------------------+----------+--------------------+--------+--------+    Location                PSV (cm/s) Intralum. Diam.  (cm) Waveform Comments   +-----------------------+----------+--------------------+--------+--------+    Brachial Antecub. fossa 117        0.65                 biphasic              +-----------------------+----------+--------------------+--------+--------+    Radial Art at Wrist     86         0.16                 biphasic              +-----------------------+----------+--------------------+--------+--------+    Ulnar Art at Wrist      92         0.16                 biphasic              +-----------------------+----------+--------------------+--------+--------+       Assessment/Plan:     82 year old female with end-stage renal disease currently dialyzing via left IJ catheter that was placed this morning.  She has failed bilateral upper extremity accesses.  I do not feel pedal pulses and given her body habitus she does not appear to be a good candidate for femoral access.  We will begin with bilateral upper extremity venography which was performed just 9 months ago.  From here we will plan for permanent access.  I discussed this with the patient and her daughter.  She does have a slightly stronger radial pulse on the left as well as a larger brachial artery by arterial duplex may be she would be a better candidate for a left-sided access we may need to consider more advanced access considerations  including hero grafting.  We will plan for venography on a nondialysis day likely Monday in the near future and planned permanent access from there.     Waynetta Sandy MD [facility]

## 2022-01-19 DIAGNOSIS — D631 Anemia in chronic kidney disease: Secondary | ICD-10-CM | POA: Diagnosis not present

## 2022-01-19 DIAGNOSIS — N2581 Secondary hyperparathyroidism of renal origin: Secondary | ICD-10-CM | POA: Diagnosis not present

## 2022-01-19 DIAGNOSIS — R519 Headache, unspecified: Secondary | ICD-10-CM | POA: Diagnosis not present

## 2022-01-19 DIAGNOSIS — Z992 Dependence on renal dialysis: Secondary | ICD-10-CM | POA: Diagnosis not present

## 2022-01-19 DIAGNOSIS — N186 End stage renal disease: Secondary | ICD-10-CM | POA: Diagnosis not present

## 2022-01-19 DIAGNOSIS — E1129 Type 2 diabetes mellitus with other diabetic kidney complication: Secondary | ICD-10-CM | POA: Diagnosis not present

## 2022-01-19 DIAGNOSIS — D509 Iron deficiency anemia, unspecified: Secondary | ICD-10-CM | POA: Diagnosis not present

## 2022-01-19 DIAGNOSIS — T8249XA Other complication of vascular dialysis catheter, initial encounter: Secondary | ICD-10-CM | POA: Diagnosis not present

## 2022-01-19 DIAGNOSIS — D688 Other specified coagulation defects: Secondary | ICD-10-CM | POA: Diagnosis not present

## 2022-01-21 DIAGNOSIS — Z992 Dependence on renal dialysis: Secondary | ICD-10-CM | POA: Diagnosis not present

## 2022-01-21 DIAGNOSIS — E1129 Type 2 diabetes mellitus with other diabetic kidney complication: Secondary | ICD-10-CM | POA: Diagnosis not present

## 2022-01-21 DIAGNOSIS — N2581 Secondary hyperparathyroidism of renal origin: Secondary | ICD-10-CM | POA: Diagnosis not present

## 2022-01-21 DIAGNOSIS — D631 Anemia in chronic kidney disease: Secondary | ICD-10-CM | POA: Diagnosis not present

## 2022-01-21 DIAGNOSIS — R519 Headache, unspecified: Secondary | ICD-10-CM | POA: Diagnosis not present

## 2022-01-21 DIAGNOSIS — N186 End stage renal disease: Secondary | ICD-10-CM | POA: Diagnosis not present

## 2022-01-21 DIAGNOSIS — D688 Other specified coagulation defects: Secondary | ICD-10-CM | POA: Diagnosis not present

## 2022-01-21 DIAGNOSIS — D509 Iron deficiency anemia, unspecified: Secondary | ICD-10-CM | POA: Diagnosis not present

## 2022-01-21 DIAGNOSIS — T8249XA Other complication of vascular dialysis catheter, initial encounter: Secondary | ICD-10-CM | POA: Diagnosis not present

## 2022-01-23 ENCOUNTER — Encounter (HOSPITAL_COMMUNITY): Payer: Self-pay | Admitting: Vascular Surgery

## 2022-01-23 ENCOUNTER — Encounter (HOSPITAL_COMMUNITY): Payer: Medicare Other

## 2022-01-23 ENCOUNTER — Ambulatory Visit (HOSPITAL_COMMUNITY)
Admission: RE | Admit: 2022-01-23 | Discharge: 2022-01-23 | Disposition: A | Discharge status: Home / Self Care | Payer: Medicare Other | Attending: Vascular Surgery | Admitting: Vascular Surgery

## 2022-01-23 ENCOUNTER — Other Ambulatory Visit (HOSPITAL_COMMUNITY): Payer: Medicare Other

## 2022-01-23 ENCOUNTER — Encounter (HOSPITAL_COMMUNITY)
Admission: RE | Disposition: A | Discharge status: Home / Self Care | Payer: Self-pay | Source: Home / Self Care | Attending: Vascular Surgery

## 2022-01-23 ENCOUNTER — Other Ambulatory Visit: Payer: Self-pay

## 2022-01-23 ENCOUNTER — Ambulatory Visit: Payer: Medicare Other | Admitting: Surgery

## 2022-01-23 DIAGNOSIS — Z794 Long term (current) use of insulin: Secondary | ICD-10-CM | POA: Insufficient documentation

## 2022-01-23 DIAGNOSIS — I120 Hypertensive chronic kidney disease with stage 5 chronic kidney disease or end stage renal disease: Secondary | ICD-10-CM | POA: Diagnosis not present

## 2022-01-23 DIAGNOSIS — Z992 Dependence on renal dialysis: Secondary | ICD-10-CM | POA: Insufficient documentation

## 2022-01-23 DIAGNOSIS — E1122 Type 2 diabetes mellitus with diabetic chronic kidney disease: Secondary | ICD-10-CM | POA: Insufficient documentation

## 2022-01-23 DIAGNOSIS — N186 End stage renal disease: Secondary | ICD-10-CM | POA: Insufficient documentation

## 2022-01-23 HISTORY — PX: UPPER EXTREMITY VENOGRAPHY: CATH118272

## 2022-01-23 LAB — POCT I-STAT, CHEM 8
BUN: 60 mg/dL — ABNORMAL HIGH (ref 8–23)
Calcium, Ion: 1.07 mmol/L — ABNORMAL LOW (ref 1.15–1.40)
Chloride: 101 mmol/L (ref 98–111)
Creatinine, Ser: 9 mg/dL — ABNORMAL HIGH (ref 0.44–1.00)
Glucose, Bld: 197 mg/dL — ABNORMAL HIGH (ref 70–99)
HCT: 39 % (ref 36.0–46.0)
Hemoglobin: 13.3 g/dL (ref 12.0–15.0)
Potassium: 4.5 mmol/L (ref 3.5–5.1)
Sodium: 135 mmol/L (ref 135–145)
TCO2: 27 mmol/L (ref 22–32)

## 2022-01-23 SURGERY — UPPER EXTREMITY VENOGRAPHY
Anesthesia: LOCAL | Laterality: Left

## 2022-01-23 MED ORDER — SODIUM CHLORIDE 0.9 % IV SOLN: 250.0000 mL | INTRAVENOUS | Status: DC | PRN

## 2022-01-23 MED ORDER — SODIUM CHLORIDE 0.9% FLUSH: 3.0000 mL | INTRAVENOUS | Status: DC | PRN

## 2022-01-23 MED ORDER — IODIXANOL 320 MG/ML IV SOLN
INTRAVENOUS | Status: DC | PRN
  Administered 2022-01-23: 30 mL

## 2022-01-23 MED ORDER — SODIUM CHLORIDE 0.9% FLUSH: 3.0000 mL | Freq: Two times a day (BID) | INTRAVENOUS | Status: DC

## 2022-01-23 SURGICAL SUPPLY — 2 items
STOPCOCK MORSE 400PSI 3WAY (MISCELLANEOUS) ×1 IMPLANT
TUBING CIL FLEX 10 FLL-RA (TUBING) ×1 IMPLANT

## 2022-01-23 NOTE — Interval H&P Note (Signed)
History and Physical Interval Note:  01/23/2022 7:23 AM  Katie Garcia  has presented today for surgery, with the diagnosis of instage renal.  The various methods of treatment have been discussed with the patient and family. After consideration of risks, benefits and other options for treatment, the patient has consented to  Procedure(s): UPPER EXTREMITY VENOGRAPHY (Bilateral) as a surgical intervention.  The patient's history has been reviewed, patient examined, no change in status, stable for surgery.  I have reviewed the patient's chart and labs.  Questions were answered to the patient's satisfaction.     [name]

## 2022-01-23 NOTE — Op Note (Signed)
° ° °  Patient name: Katie Garcia MRN: [id number] DOB: 1941/05/27 Sex: female  01/23/2022 Pre-operative Diagnosis: esrd Post-operative diagnosis:  Same Surgeon:  Erlene Quan C. Donzetta Matters, MD Procedure Performed: Left upper extremity venogram  Indications: 81 year old female with history of bilateral upper extremity access that is failed.  Most recently she has a right upper arm graft that is failed.  She is now scheduled for venography to plan further access.  Findings: The left central venous system is patent.  There is a basilic vein in the mid upper arm which is patent although thickened this gives rise to an axillary vein which is patent and compressible by ultrasound on exam.  Plan for left upper arm AV fistula versus graft in the near future on a nondialysis day.   Procedure:  The patient was identified in the holding area and taken to room 8.  The patient was then placed supine on the table and timeout called.  Left upper extremity venography was performed.  With the above findings we will plan for left upper extremity fistula versus graft.  Contrast: 30cc     Delvina Mizzell C. Donzetta Matters, MD [facility] [place] Office: [phone] Pager: [phone]

## 2022-01-24 DIAGNOSIS — N186 End stage renal disease: Secondary | ICD-10-CM | POA: Diagnosis not present

## 2022-01-24 DIAGNOSIS — N2581 Secondary hyperparathyroidism of renal origin: Secondary | ICD-10-CM | POA: Diagnosis not present

## 2022-01-24 DIAGNOSIS — R519 Headache, unspecified: Secondary | ICD-10-CM | POA: Diagnosis not present

## 2022-01-24 DIAGNOSIS — T8249XA Other complication of vascular dialysis catheter, initial encounter: Secondary | ICD-10-CM | POA: Diagnosis not present

## 2022-01-24 DIAGNOSIS — D509 Iron deficiency anemia, unspecified: Secondary | ICD-10-CM | POA: Diagnosis not present

## 2022-01-24 DIAGNOSIS — E1122 Type 2 diabetes mellitus with diabetic chronic kidney disease: Secondary | ICD-10-CM | POA: Diagnosis not present

## 2022-01-24 DIAGNOSIS — D631 Anemia in chronic kidney disease: Secondary | ICD-10-CM | POA: Diagnosis not present

## 2022-01-24 DIAGNOSIS — D688 Other specified coagulation defects: Secondary | ICD-10-CM | POA: Diagnosis not present

## 2022-01-24 DIAGNOSIS — E1129 Type 2 diabetes mellitus with other diabetic kidney complication: Secondary | ICD-10-CM | POA: Diagnosis not present

## 2022-01-24 DIAGNOSIS — Z992 Dependence on renal dialysis: Secondary | ICD-10-CM | POA: Diagnosis not present

## 2022-01-25 ENCOUNTER — Ambulatory Visit: Payer: Medicare Other | Admitting: Vascular Surgery

## 2022-01-26 DIAGNOSIS — N186 End stage renal disease: Secondary | ICD-10-CM | POA: Diagnosis not present

## 2022-01-26 DIAGNOSIS — D631 Anemia in chronic kidney disease: Secondary | ICD-10-CM | POA: Diagnosis not present

## 2022-01-26 DIAGNOSIS — T8249XA Other complication of vascular dialysis catheter, initial encounter: Secondary | ICD-10-CM | POA: Diagnosis not present

## 2022-01-26 DIAGNOSIS — N2581 Secondary hyperparathyroidism of renal origin: Secondary | ICD-10-CM | POA: Diagnosis not present

## 2022-01-26 DIAGNOSIS — Z992 Dependence on renal dialysis: Secondary | ICD-10-CM | POA: Diagnosis not present

## 2022-01-26 DIAGNOSIS — D688 Other specified coagulation defects: Secondary | ICD-10-CM | POA: Diagnosis not present

## 2022-01-26 DIAGNOSIS — E1129 Type 2 diabetes mellitus with other diabetic kidney complication: Secondary | ICD-10-CM | POA: Diagnosis not present

## 2022-01-26 DIAGNOSIS — D509 Iron deficiency anemia, unspecified: Secondary | ICD-10-CM | POA: Diagnosis not present

## 2022-01-26 DIAGNOSIS — R519 Headache, unspecified: Secondary | ICD-10-CM | POA: Diagnosis not present

## 2022-01-27 ENCOUNTER — Other Ambulatory Visit: Payer: Self-pay

## 2022-01-28 DIAGNOSIS — D688 Other specified coagulation defects: Secondary | ICD-10-CM | POA: Diagnosis not present

## 2022-01-28 DIAGNOSIS — D631 Anemia in chronic kidney disease: Secondary | ICD-10-CM | POA: Diagnosis not present

## 2022-01-28 DIAGNOSIS — N2581 Secondary hyperparathyroidism of renal origin: Secondary | ICD-10-CM | POA: Diagnosis not present

## 2022-01-28 DIAGNOSIS — Z992 Dependence on renal dialysis: Secondary | ICD-10-CM | POA: Diagnosis not present

## 2022-01-28 DIAGNOSIS — E1129 Type 2 diabetes mellitus with other diabetic kidney complication: Secondary | ICD-10-CM | POA: Diagnosis not present

## 2022-01-28 DIAGNOSIS — N186 End stage renal disease: Secondary | ICD-10-CM | POA: Diagnosis not present

## 2022-01-28 DIAGNOSIS — T8249XA Other complication of vascular dialysis catheter, initial encounter: Secondary | ICD-10-CM | POA: Diagnosis not present

## 2022-01-28 DIAGNOSIS — D509 Iron deficiency anemia, unspecified: Secondary | ICD-10-CM | POA: Diagnosis not present

## 2022-01-28 DIAGNOSIS — R519 Headache, unspecified: Secondary | ICD-10-CM | POA: Diagnosis not present

## 2022-01-31 DIAGNOSIS — N2581 Secondary hyperparathyroidism of renal origin: Secondary | ICD-10-CM | POA: Diagnosis not present

## 2022-01-31 DIAGNOSIS — Z992 Dependence on renal dialysis: Secondary | ICD-10-CM | POA: Diagnosis not present

## 2022-01-31 DIAGNOSIS — R519 Headache, unspecified: Secondary | ICD-10-CM | POA: Diagnosis not present

## 2022-01-31 DIAGNOSIS — D509 Iron deficiency anemia, unspecified: Secondary | ICD-10-CM | POA: Diagnosis not present

## 2022-01-31 DIAGNOSIS — D688 Other specified coagulation defects: Secondary | ICD-10-CM | POA: Diagnosis not present

## 2022-01-31 DIAGNOSIS — N186 End stage renal disease: Secondary | ICD-10-CM | POA: Diagnosis not present

## 2022-01-31 DIAGNOSIS — T8249XA Other complication of vascular dialysis catheter, initial encounter: Secondary | ICD-10-CM | POA: Diagnosis not present

## 2022-01-31 DIAGNOSIS — D631 Anemia in chronic kidney disease: Secondary | ICD-10-CM | POA: Diagnosis not present

## 2022-01-31 DIAGNOSIS — E1129 Type 2 diabetes mellitus with other diabetic kidney complication: Secondary | ICD-10-CM | POA: Diagnosis not present

## 2022-02-02 ENCOUNTER — Other Ambulatory Visit: Payer: Self-pay

## 2022-02-02 DIAGNOSIS — T8249XA Other complication of vascular dialysis catheter, initial encounter: Secondary | ICD-10-CM | POA: Diagnosis not present

## 2022-02-02 DIAGNOSIS — D509 Iron deficiency anemia, unspecified: Secondary | ICD-10-CM | POA: Diagnosis not present

## 2022-02-02 DIAGNOSIS — R519 Headache, unspecified: Secondary | ICD-10-CM | POA: Diagnosis not present

## 2022-02-02 DIAGNOSIS — E1129 Type 2 diabetes mellitus with other diabetic kidney complication: Secondary | ICD-10-CM | POA: Diagnosis not present

## 2022-02-02 DIAGNOSIS — D631 Anemia in chronic kidney disease: Secondary | ICD-10-CM | POA: Diagnosis not present

## 2022-02-02 DIAGNOSIS — D688 Other specified coagulation defects: Secondary | ICD-10-CM | POA: Diagnosis not present

## 2022-02-02 DIAGNOSIS — Z992 Dependence on renal dialysis: Secondary | ICD-10-CM | POA: Diagnosis not present

## 2022-02-02 DIAGNOSIS — N186 End stage renal disease: Secondary | ICD-10-CM | POA: Diagnosis not present

## 2022-02-02 DIAGNOSIS — N2581 Secondary hyperparathyroidism of renal origin: Secondary | ICD-10-CM | POA: Diagnosis not present

## 2022-02-02 NOTE — Progress Notes (Signed)
Katie Garcia denies chest pain or shortness of breath. Patient denies having any s/s of Covid in her household.  Patient denies any known exposure to Covid.   Katie Garcia has type II, she requires regular Insulin daily. Patients CBGs run 70 - 200.  Katie. Garcia reports that she takes Novolog R around noon, takes NPH in the evening .  I instructed patient to take 7 units of NPH in the evening tonight. I instructed patient to check CBG after awaking and every 2 hours until arrival  to the hospital. I Instructed Katie. Garcia if CBG is less than 70 to take 4 Glucose Tablets or 1 tube of Glucose Gel or 1/2 cup of a clear juice. Recheck CBG in 15 minutes if CBG is not over 70 call, pre- op desk at [phone] for further instructions. If scheduled to receive Insulin, do not take Insulin.  I instructed Katie Garcia to  wash up well with antibiotic soap, if it is available.  Dry off with a clean towel. Do not put lotion, powder, cologne or deodorant or makeup.No jewelry or piercings. Men may shave their face and neck. Woman should not shave. No nail polish, artificial or acrylic nails. Wear clean clothes, brush your teeth.Glasses, contact lens,dentures or partials may not be worn in the OR. If you need to wear them, please bring a case for glasses, do not wear contacts or bring a case, the hospital does not have contact cases, dentures or partials will have to be removed , make sure they are clean, we will provide a denture cup to put them in. You will need some one to drive you home and a responsible person over the age of 72 to stay with you for the first 24 hours after surgery.

## 2022-02-03 ENCOUNTER — Ambulatory Visit (HOSPITAL_COMMUNITY)
Admission: RE | Admit: 2022-02-03 | Discharge: 2022-02-03 | Disposition: A | Discharge status: Home / Self Care | Payer: Medicare Other | Attending: Vascular Surgery | Admitting: Vascular Surgery

## 2022-02-03 ENCOUNTER — Ambulatory Visit (HOSPITAL_COMMUNITY): Payer: Medicare Other

## 2022-02-03 ENCOUNTER — Ambulatory Visit (HOSPITAL_COMMUNITY): Payer: Medicare Other | Admitting: Anesthesiology

## 2022-02-03 ENCOUNTER — Encounter (HOSPITAL_COMMUNITY): Payer: Self-pay | Admitting: Vascular Surgery

## 2022-02-03 ENCOUNTER — Other Ambulatory Visit: Payer: Self-pay

## 2022-02-03 ENCOUNTER — Ambulatory Visit (HOSPITAL_BASED_OUTPATIENT_CLINIC_OR_DEPARTMENT_OTHER): Payer: Medicare Other | Admitting: Anesthesiology

## 2022-02-03 ENCOUNTER — Encounter (HOSPITAL_COMMUNITY)
Admission: RE | Disposition: A | Discharge status: Home / Self Care | Payer: Self-pay | Source: Home / Self Care | Attending: Vascular Surgery

## 2022-02-03 DIAGNOSIS — I120 Hypertensive chronic kidney disease with stage 5 chronic kidney disease or end stage renal disease: Secondary | ICD-10-CM | POA: Insufficient documentation

## 2022-02-03 DIAGNOSIS — Z794 Long term (current) use of insulin: Secondary | ICD-10-CM | POA: Diagnosis not present

## 2022-02-03 DIAGNOSIS — N186 End stage renal disease: Secondary | ICD-10-CM | POA: Insufficient documentation

## 2022-02-03 DIAGNOSIS — Z992 Dependence on renal dialysis: Secondary | ICD-10-CM

## 2022-02-03 DIAGNOSIS — E1122 Type 2 diabetes mellitus with diabetic chronic kidney disease: Secondary | ICD-10-CM

## 2022-02-03 HISTORY — PX: AV FISTULA PLACEMENT: SHX1204

## 2022-02-03 LAB — GLUCOSE, CAPILLARY
Glucose-Capillary: 194 mg/dL — ABNORMAL HIGH (ref 70–99)
Glucose-Capillary: 212 mg/dL — ABNORMAL HIGH (ref 70–99)
Glucose-Capillary: 181 mg/dL — ABNORMAL HIGH (ref 70–99)

## 2022-02-03 LAB — POCT I-STAT, CHEM 8
BUN: 36 mg/dL — ABNORMAL HIGH (ref 8–23)
Calcium, Ion: 1.05 mmol/L — ABNORMAL LOW (ref 1.15–1.40)
Chloride: 96 mmol/L — ABNORMAL LOW (ref 98–111)
Creatinine, Ser: 7.1 mg/dL — ABNORMAL HIGH (ref 0.44–1.00)
Glucose, Bld: 206 mg/dL — ABNORMAL HIGH (ref 70–99)
HCT: 43 % (ref 36.0–46.0)
Hemoglobin: 14.6 g/dL (ref 12.0–15.0)
Potassium: 3.9 mmol/L (ref 3.5–5.1)
Sodium: 138 mmol/L (ref 135–145)
TCO2: 34 mmol/L — ABNORMAL HIGH (ref 22–32)

## 2022-02-03 SURGERY — ARTERIOVENOUS (AV) FISTULA CREATION
Anesthesia: General | Site: Arm Upper | Laterality: Left

## 2022-02-03 MED ORDER — LIDOCAINE 2% (20 MG/ML) 5 ML SYRINGE
INTRAMUSCULAR | Status: DC | PRN
Start: 2022-02-03 — End: 2022-02-03
  Administered 2022-02-03: 80 mg via INTRAVENOUS

## 2022-02-03 MED ORDER — PROPOFOL 10 MG/ML IV BOLUS
INTRAVENOUS | Status: AC
  Filled 2022-02-03: qty 20

## 2022-02-03 MED ORDER — VANCOMYCIN HCL IN DEXTROSE 1-5 GM/200ML-% IV SOLN
INTRAVENOUS | Status: AC
  Administered 2022-02-03: 1000 mg via INTRAVENOUS
  Filled 2022-02-03: qty 200

## 2022-02-03 MED ORDER — MIDAZOLAM HCL 2 MG/2ML IJ SOLN
INTRAMUSCULAR | Status: AC
  Filled 2022-02-03: qty 2

## 2022-02-03 MED ORDER — CHLORHEXIDINE GLUCONATE 4 % EX LIQD: 60.0000 mL | Freq: Once | CUTANEOUS | Status: DC

## 2022-02-03 MED ORDER — FENTANYL CITRATE (PF) 100 MCG/2ML IJ SOLN
INTRAMUSCULAR | Status: AC
  Filled 2022-02-03: qty 2

## 2022-02-03 MED ORDER — FENTANYL CITRATE (PF) 100 MCG/2ML IJ SOLN
INTRAMUSCULAR | Status: DC | PRN
Start: 2022-02-03 — End: 2022-02-03
  Administered 2022-02-03: 50 ug via INTRAVENOUS

## 2022-02-03 MED ORDER — HEPARIN SODIUM (PORCINE) 1000 UNIT/ML IJ SOLN
INTRAMUSCULAR | Status: AC
  Filled 2022-02-03: qty 10

## 2022-02-03 MED ORDER — ACETAMINOPHEN 500 MG PO TABS
ORAL_TABLET | ORAL | Status: AC
  Administered 2022-02-03: 1000 mg via ORAL
  Filled 2022-02-03: qty 2

## 2022-02-03 MED ORDER — VANCOMYCIN HCL IN DEXTROSE 1-5 GM/200ML-% IV SOLN: 1000.0000 mg | INTRAVENOUS | Status: AC

## 2022-02-03 MED ORDER — FENTANYL CITRATE (PF) 250 MCG/5ML IJ SOLN
INTRAMUSCULAR | Status: AC
  Filled 2022-02-03: qty 5

## 2022-02-03 MED ORDER — LIDOCAINE-EPINEPHRINE (PF) 1 %-1:200000 IJ SOLN
INTRAMUSCULAR | Status: AC
  Filled 2022-02-03: qty 30

## 2022-02-03 MED ORDER — PAPAVERINE HCL 30 MG/ML IJ SOLN
INTRAMUSCULAR | Status: AC
  Filled 2022-02-03: qty 2

## 2022-02-03 MED ORDER — SODIUM CHLORIDE 0.9 % IV SOLN: INTRAVENOUS | Status: DC

## 2022-02-03 MED ORDER — 0.9 % SODIUM CHLORIDE (POUR BTL) OPTIME
TOPICAL | Status: DC | PRN
  Administered 2022-02-03: 1000 mL

## 2022-02-03 MED ORDER — ACETAMINOPHEN 500 MG PO TABS: 1000.0000 mg | ORAL_TABLET | Freq: Once | ORAL | Status: AC

## 2022-02-03 MED ORDER — INSULIN ASPART 100 UNIT/ML IJ SOLN
0.0000 [IU] | INTRAMUSCULAR | Status: AC | PRN
  Administered 2022-02-03: 2 [IU] via SUBCUTANEOUS
  Filled 2022-02-03: qty 1

## 2022-02-03 MED ORDER — SUCCINYLCHOLINE CHLORIDE 200 MG/10ML IV SOSY
PREFILLED_SYRINGE | INTRAVENOUS | Status: DC | PRN
Start: 2022-02-03 — End: 2022-02-03
  Administered 2022-02-03: 100 mg via INTRAVENOUS

## 2022-02-03 MED ORDER — PHENYLEPHRINE 40 MCG/ML (10ML) SYRINGE FOR IV PUSH (FOR BLOOD PRESSURE SUPPORT)
PREFILLED_SYRINGE | INTRAVENOUS | Status: DC | PRN
  Administered 2022-02-03 (×4): 80 ug via INTRAVENOUS

## 2022-02-03 MED ORDER — HEPARIN 6000 UNIT IRRIGATION SOLUTION
Status: DC | PRN
  Administered 2022-02-03: 1

## 2022-02-03 MED ORDER — PROPOFOL 10 MG/ML IV BOLUS
INTRAVENOUS | Status: DC | PRN
  Administered 2022-02-03: 100 mg via INTRAVENOUS

## 2022-02-03 MED ORDER — HEPARIN 6000 UNIT IRRIGATION SOLUTION
Status: AC
  Filled 2022-02-03: qty 500

## 2022-02-03 MED ORDER — PHENYLEPHRINE HCL-NACL 20-0.9 MG/250ML-% IV SOLN
INTRAVENOUS | Status: DC | PRN
  Administered 2022-02-03: 30 ug/min via INTRAVENOUS

## 2022-02-03 MED ORDER — HYDROCODONE-ACETAMINOPHEN 5-325 MG PO TABS: 1.0000 | ORAL_TABLET | Freq: Four times a day (QID) | ORAL | 0 refills | Status: DC | PRN

## 2022-02-03 MED ORDER — CHLORHEXIDINE GLUCONATE 0.12 % MT SOLN
OROMUCOSAL | Status: AC
  Filled 2022-02-03: qty 15

## 2022-02-03 MED ORDER — INSULIN ASPART 100 UNIT/ML IJ SOLN
INTRAMUSCULAR | Status: AC
  Administered 2022-02-03: 2 [IU] via SUBCUTANEOUS
  Filled 2022-02-03: qty 1

## 2022-02-03 MED ORDER — ONDANSETRON HCL 4 MG/2ML IJ SOLN
INTRAMUSCULAR | Status: DC | PRN
  Administered 2022-02-03: 4 mg via INTRAVENOUS

## 2022-02-03 MED ORDER — CHLORHEXIDINE GLUCONATE 0.12 % MT SOLN
15.0000 mL | Freq: Once | OROMUCOSAL | Status: DC
  Filled 2022-02-03: qty 15

## 2022-02-03 SURGICAL SUPPLY — 34 items
ADH SKN CLS APL DERMABOND .7 (GAUZE/BANDAGES/DRESSINGS) ×1
ARMBAND PINK RESTRICT EXTREMIT (MISCELLANEOUS) ×2 IMPLANT
BAG COUNTER SPONGE SURGICOUNT (BAG) ×2 IMPLANT
CANISTER SUCT 3000ML PPV (MISCELLANEOUS) ×2 IMPLANT
CLIP LIGATING EXTRA MED SLVR (CLIP) ×2 IMPLANT
CLIP LIGATING EXTRA SM BLUE (MISCELLANEOUS) ×2 IMPLANT
COVER PROBE W GEL 5X96 (DRAPES) IMPLANT
DERMABOND ADVANCED (GAUZE/BANDAGES/DRESSINGS) ×1
DERMABOND ADVANCED .7 DNX12 (GAUZE/BANDAGES/DRESSINGS) ×1 IMPLANT
DRAPE HALF SHEET 40X57 (DRAPES) ×1 IMPLANT
ELECT REM PT RETURN 9FT ADLT (ELECTROSURGICAL) ×2
ELECTRODE REM PT RTRN 9FT ADLT (ELECTROSURGICAL) ×1 IMPLANT
GAUZE 4X4 16PLY ~~LOC~~+RFID DBL (SPONGE) ×1 IMPLANT
GLOVE SURG ENC MOIS LTX SZ7.5 (GLOVE) ×3 IMPLANT
GLOVE SURG UNDER POLY LF SZ7 (GLOVE) ×1 IMPLANT
GLOVE SURG UNDER POLY LF SZ7.5 (GLOVE) ×1 IMPLANT
GOWN STRL REUS W/ TWL LRG LVL3 (GOWN DISPOSABLE) ×2 IMPLANT
GOWN STRL REUS W/ TWL XL LVL3 (GOWN DISPOSABLE) ×1 IMPLANT
GOWN STRL REUS W/TWL LRG LVL3 (GOWN DISPOSABLE) ×4
GOWN STRL REUS W/TWL XL LVL3 (GOWN DISPOSABLE) ×4
INSERT FOGARTY SM (MISCELLANEOUS) IMPLANT
KIT BASIN OR (CUSTOM PROCEDURE TRAY) ×2 IMPLANT
KIT TURNOVER KIT B (KITS) ×2 IMPLANT
NS IRRIG 1000ML POUR BTL (IV SOLUTION) ×2 IMPLANT
PACK CV ACCESS (CUSTOM PROCEDURE TRAY) ×2 IMPLANT
PAD ARMBOARD 7.5X6 YLW CONV (MISCELLANEOUS) ×4 IMPLANT
SPONGE T-LAP 18X18 ~~LOC~~+RFID (SPONGE) ×1 IMPLANT
SUT MNCRL AB 4-0 PS2 18 (SUTURE) ×2 IMPLANT
SUT PROLENE 6 0 BV (SUTURE) ×3 IMPLANT
SUT VIC AB 3-0 SH 27 (SUTURE) ×2
SUT VIC AB 3-0 SH 27X BRD (SUTURE) ×1 IMPLANT
TOWEL GREEN STERILE (TOWEL DISPOSABLE) ×2 IMPLANT
UNDERPAD 30X36 HEAVY ABSORB (UNDERPADS AND DIAPERS) ×2 IMPLANT
WATER STERILE IRR 1000ML POUR (IV SOLUTION) ×2 IMPLANT

## 2022-02-03 NOTE — Discharge Instructions (Signed)
° °  [facility] [place] ° °Discharge Instructions ° °AV Fistula or Graft Surgery for Dialysis Access ° °Please refer to the following instructions for your post-procedure care. Your surgeon or physician assistant will discuss any changes with you. ° °Activity ° °You may drive the day following your surgery, if you are comfortable and no longer taking prescription pain medication. Resume full activity as the soreness in your incision resolves. ° °Bathing/Showering ° °You may shower after you go home. Keep your incision dry for 48 hours. Do not soak in a bathtub, hot tub, or swim until the incision heals completely. You may not shower if you have a hemodialysis catheter. ° °Incision Care ° °Clean your incision with mild soap and water after 48 hours. Pat the area dry with a clean towel. You do not need a bandage unless otherwise instructed. Do not apply any ointments or creams to your incision. You may have skin glue on your incision. Do not peel it off. It will come off on its own in about one week. Your arm may swell a bit after surgery. To reduce swelling use pillows to elevate your arm so it is above your heart. Your doctor will tell you if you need to lightly wrap your arm with an ACE bandage. ° °Diet ° °Resume your normal diet. There are not special food restrictions following this procedure. In order to heal from your surgery, it is CRITICAL to get adequate nutrition. Your body requires vitamins, minerals, and protein. Vegetables are the best source of vitamins and minerals. Vegetables also provide the perfect balance of protein. Processed food has little nutritional value, so try to avoid this. ° °Medications ° °Resume taking all of your medications. If your incision is causing pain, you may take over-the counter pain relievers such as acetaminophen (Tylenol). If you were prescribed a stronger pain medication, please be aware these medications can cause nausea and constipation. Prevent  nausea by taking the medication with a snack or meal. Avoid constipation by drinking plenty of fluids and eating foods with high amount of fiber, such as fruits, vegetables, and grains. Do not take Tylenol if you are taking prescription pain medications. ° ° ° ° °Follow up °Your surgeon may want to see you in the office following your access surgery. If so, this will be arranged at the time of your surgery. ° °Please call us immediately for any of the following conditions: ° °Increased pain, redness, drainage (pus) from your incision site °Fever of 101 degrees or higher °Severe or worsening pain at your incision site °Hand pain or numbness. ° °Reduce your risk of vascular disease: ° °Stop smoking. If you would like help, call [facility] at 1-800-QUIT-NOW (1-800-784-8669) or [place] at 336-586-4000 ° °Manage your cholesterol °Maintain a desired weight °Control your diabetes °Keep your blood pressure down ° °Dialysis ° °It will take several weeks to several months for your new dialysis access to be ready for use. Your surgeon will determine when it is OK to use it. Your nephrologist will continue to direct your dialysis. You can continue to use your Permcath until your new access is ready for use. ° °If you have any questions, please call the office at [phone]. ° °

## 2022-02-03 NOTE — H&P (Signed)
H+P   HPI Katie Garcia is a 81 y.o. female history of end-stage renal disease.  She has a previous left arm AV fistula.  This subsequently failed she had a right IJ catheter.  We performed upper extremity venography we then placed left arm AV graft which failed shortly thereafter.  She is more recently been dialyzing via right arm AV graft placed last July.  She states that there was a stent in this and she does have previous stenting of her right upper extremity from a fistula there as well.  She now presents for further access.  She does not take any blood thinners.  She has no problems with her hands with sensation and motor intact bilaterally.    Past Medical History:  Diagnosis Date   Anemia    Arthritis    Dementia ([place])    Diabetes mellitus without complication ([place])    ESRD    Dialysis T/Th/Sa at [facility]   GERD (gastroesophageal reflux disease)    Gout    Headache    Hypertension    Type 2 diabetes mellitus ([place])     Past Surgical History:  Procedure Laterality Date   ABDOMINAL HYSTERECTOMY     AV FISTULA PLACEMENT Left 10/02/2016   Procedure: ARTERIOVENOUS (AV) FISTULA CREATION LEFT UPPER ARM;  Surgeon: Waynetta Sandy, MD;  Location: [place];  Service: Vascular;  Laterality: Left;   AV FISTULA PLACEMENT Right 07/14/2020   Procedure: RIGHT ARM ARTERIOVENOUS FISTULA CREATION;  Surgeon: Waynetta Sandy, MD;  Location: [place];  Service: Vascular;  Laterality: Right;   AV FISTULA PLACEMENT Left 05/16/2021   Procedure: INSERTION OF LEFT UPPER ARM ARTERIOVENOUS (AV) GORE-TEX GRAFT;  Surgeon: Marty Heck, MD;  Location: [place];  Service: Vascular;  Laterality: Left;   AV FISTULA PLACEMENT Right 07/13/2021   Procedure: RIGHT ARM ARTERIOVENOUS (AV) FISTULA GRAFT INSERTION;  Surgeon: Waynetta Sandy, MD;  Location: [place];  Service: Vascular;  Laterality: Right;   [place] Left 05/17/2020   Procedure: Insertion of Left arm  arteriovenous gortex graftarm ;  Surgeon: Angelia Mould, MD;  Location: [place];  Service: Vascular;  Laterality: Left;   CESAREAN SECTION     DIALYSIS/PERMA CATHETER INSERTION     REVISON OF ARTERIOVENOUS FISTULA Right 09/15/2020   Procedure: REVISON OF RIGHT UPPER ARM  ARTERIOVENOUS FISTULA;  Surgeon: Waynetta Sandy, MD;  Location: [place];  Service: Vascular;  Laterality: Right;   UPPER EXTREMITY VENOGRAPHY Bilateral 04/18/2021   Procedure: UPPER EXTREMITY VENOGRAPHY;  Surgeon: Waynetta Sandy, MD;  Location: [facility];  Service: Cardiovascular;  Laterality: Bilateral;   UPPER EXTREMITY VENOGRAPHY Left 01/23/2022   Procedure: UPPER EXTREMITY VENOGRAPHY;  Surgeon: Waynetta Sandy, MD;  Location: [facility];  Service: Cardiovascular;  Laterality: Left;    Allergies  Allergen Reactions   Penicillins Rash    Did it involve swelling of the face/tongue/throat, SOB, or low BP? No Did it involve sudden or severe rash/hives, skin peeling, or any reaction on the inside of your mouth or nose? Yes Did you need to seek medical attention at a hospital or doctor's office? N/A When did it last happen? N/A      If all above answers are "NO", may proceed with cephalosporin use.    Prior to Admission medications   Medication Sig Start Date End Date Taking? Authorizing Provider  acetaminophen (TYLENOL) 500 MG tablet Take 500-1,000 mg by mouth every 6 (six)  hours as needed for moderate pain or headache.   Yes [provider]  allopurinol (ZYLOPRIM) 100 MG tablet Take 100 mg by mouth every other day. In the morning.   Yes [provider]  B Complex-C-Zn-Folic Acid (DIALYVITE 903-YBFX 15) 0.8 MG TABS Take 1 tablet by mouth daily with supper. 06/30/21  Yes [provider]  calcium acetate (PHOSLO) 667 MG capsule Take 667 mg by mouth 3 (three) times daily with meals. 10/24/18  Yes [provider]  carvedilol (COREG) 25 MG tablet  Take 25 mg by mouth in the morning and at bedtime.    Yes [provider]  famotidine (PEPCID) 20 MG tablet Take 20 mg by mouth 2 (two) times daily.   Yes [provider]  guaiFENesin (MUCINEX) 600 MG 12 hr tablet Take 600 mg by mouth daily as needed for to loosen phlegm.   Yes [provider]  guaifenesin (ROBITUSSIN) 100 MG/5ML syrup Take 100 mg by mouth 3 (three) times daily as needed for cough.   Yes [provider]  insulin NPH Human (NOVOLIN N) 100 UNIT/ML injection Inject 0.15 mLs (15 Units total) into the skin daily before breakfast. Patient taking differently: Inject 15 Units into the skin every evening. 12/09/21  Yes Renato Shin, MD  insulin regular (NOVOLIN R) 100 units/mL injection Inject 0.15 mLs (15 Units total) into the skin daily. And syringes 1/day 12/09/21  Yes Renato Shin, MD  iron sucrose in sodium chloride 0.9 % 100 mL Iron Sucrose (Venofer) 12/08/21 11/30/22 Yes [provider]  loratadine (CLARITIN) 10 MG tablet Take 10 mg by mouth daily. 11/12/20  Yes [provider]  meclizine (ANTIVERT) 25 MG tablet Take 1 tablet (25 mg total) by mouth 3 (three) times daily as needed for dizziness. 05/25/21  Yes Deno Etienne, DO  Methoxy PEG-Epoetin Beta (MIRCERA IJ) Mircera 12/08/21 12/07/22 Yes [provider]  ofloxacin (OCUFLOX) 0.3 % ophthalmic solution Place 1 drop into the right eye in the morning, at noon, and at bedtime. 01/10/22  Yes [provider]  prednisoLONE acetate (PRED FORTE) 1 % ophthalmic suspension Place 1 drop into the right eye in the morning, at noon, and at bedtime. 01/10/22  Yes [provider]  Polson 0.5ML/31G 31G X 5/16" 0.5 ML MISC USE 1 ONCE DAILY 03/05/19  Yes [provider]  rosuvastatin (CRESTOR) 10 MG tablet Take 10 mg by mouth in the morning.   Yes [provider]  Accu-Chek FastClix Lancets MISC  03/31/18   [provider]  ACCU-CHEK GUIDE  test strip 1 each by Other route in the morning and at bedtime. E11.22 10/06/18   [provider]  Blood Glucose Monitoring Suppl (ACCU-CHEK GUIDE) w/Device KIT     [provider]  clindamycin (CLEOCIN) 300 MG capsule Take 1 capsule (300 mg total) by mouth 3 (three) times daily. Do not take on morning of hemodialysis treatment days Patient not taking: Reported on 01/18/2022 07/18/21   Barbie Banner, PA-C  Nutritional Supplements (FEEDING SUPPLEMENT, NEPRO CARB STEADY,) LIQD Take 237 mLs by mouth every Tuesday, Thursday, and Saturday at 6 PM.    [provider]    Social History   Socioeconomic History   Marital status: Married    Spouse name: Not on file   Number of children: Not on file   Years of education: Not on file   Highest education level: Not on file  Occupational History   Not on file  Tobacco  Use   Smoking status: Never   Smokeless tobacco: Never  Vaping Use   Vaping Use: Never used  Substance and Sexual Activity   Alcohol use: Not Currently   Drug use: No   Sexual activity: Not on file  Other Topics Concern   Not on file  Social History Narrative   Not on file   Social Determinants of Health   Financial Resource Strain: Not on file  Food Insecurity: Not on file  Transportation Needs: Not on file  Physical Activity: Not on file  Stress: Not on file  Social Connections: Not on file  Intimate Partner Violence: Not on file   Family History  Problem Relation Age of Onset   Diabetes Mother     ROS: no complaints today   Physical Examination  Vitals:   02/03/22 0847  BP: (!) 157/59  Pulse: 85  Resp: 18  Temp: 98.5 F (36.9 C)  SpO2: 100%   Body mass index is 38.08 kg/m.  Aaox3 Non labored respirations Left radial pulse is palpable  CBC    Component Value Date/Time   WBC 7.0 10/13/2021 1231   RBC 4.58 10/13/2021 1231   HGB 13.3 01/23/2022 0751   HCT 39.0 01/23/2022 0751   PLT 126 (L) 10/13/2021 1231   MCV 92.1  10/13/2021 1231   MCH 27.9 10/13/2021 1231   MCHC 30.3 10/13/2021 1231   RDW 17.0 (H) 10/13/2021 1231   LYMPHSABS 1.6 10/13/2021 1231   MONOABS 0.6 10/13/2021 1231   EOSABS 0.0 10/13/2021 1231   BASOSABS 0.1 10/13/2021 1231    BMET    Component Value Date/Time   NA 135 01/23/2022 0751   K 4.5 01/23/2022 0751   CL 101 01/23/2022 0751   CO2 29 10/13/2021 1231   GLUCOSE 197 (H) 01/23/2022 0751   BUN 60 (H) 01/23/2022 0751   CREATININE 9.00 (H) 01/23/2022 0751   CALCIUM 9.3 10/13/2021 1231   CALCIUM 7.3 (L) 09/17/2018 0941   GFRNONAA 9 (L) 10/13/2021 1231   GFRAA 5 (L) 12/20/2019 0500    COAGS: Lab Results  Component Value Date   INR 1.2 10/13/2021     Non-Invasive Vascular Imaging:   Upper extremity and vein mapping reviewed   ASSESSMENT/PLAN: This is a 81 y.o. female with esrd. Plan left arm avf vs avg today in OR.    Burkley Dech C. Donzetta Matters, MD [facility] Office: [phone] Pager: [phone]

## 2022-02-03 NOTE — Anesthesia Procedure Notes (Addendum)
Procedure Name: Intubation Date/Time: 02/03/2022 11:59 AM Performed by: Janene Harvey, CRNA Pre-anesthesia Checklist: Patient identified, Emergency Drugs available, Suction available and Patient being monitored Patient Re-evaluated:Patient Re-evaluated prior to induction Oxygen Delivery Method: Circle system utilized Preoxygenation: Pre-oxygenation with 100% oxygen Induction Type: IV induction Ventilation: Mask ventilation without difficulty and Oral airway inserted - appropriate to patient size Laryngoscope Size: Mac and 3 Grade View: Grade I Tube size: 7.0 mm Placement Confirmation: positive ETCO2 and breath sounds checked- equal and bilateral Secured at: 22 cm Dental Injury: Teeth and Oropharynx as per pre-operative assessment  Comments: Intubation by Gifford Shave

## 2022-02-03 NOTE — Transfer of Care (Signed)
Immediate Anesthesia Transfer of Care Note  Patient: Katie Garcia  Procedure(s) Performed:  LEFT UPPER ARM ARTERIOVENOUS FISTULA CREATION (Left: Arm Upper)  Patient Location: PACU  Anesthesia Type:General  Level of Consciousness: drowsy and patient cooperative  Airway & Oxygen Therapy: Patient Spontanous Breathing and Patient connected to nasal cannula oxygen  Post-op Assessment: Report given to RN and Post -op Vital signs reviewed and stable  Post vital signs: Reviewed and stable  Last Vitals:  Vitals Value Taken Time  BP 157/61 02/03/22 1320  Temp    Pulse 70 02/03/22 1322  Resp 16 02/03/22 1322  SpO2 100 % 02/03/22 1322  Vitals shown include unvalidated device data.  Last Pain:  Vitals:   02/03/22 0919  TempSrc:   PainSc: 0-No pain         Complications: No notable events documented.

## 2022-02-03 NOTE — Anesthesia Preprocedure Evaluation (Signed)
Anesthesia Evaluation  Patient identified by MRN, date of birth, ID band Patient awake    Reviewed: Allergy & Precautions, NPO status , Patient's Chart, lab work & pertinent test results, reviewed documented beta blocker date and time   Airway Mallampati: I  TM Distance: >3 FB Neck ROM: Full    Dental  (+) Dental Advisory Given, Edentulous Upper, Edentulous Lower   Pulmonary asthma ,    Pulmonary exam normal breath sounds clear to auscultation       Cardiovascular hypertension, Pt. on home beta blockers + Peripheral Vascular Disease  Normal cardiovascular exam Rhythm:Regular Rate:Normal     Neuro/Psych  Headaches, PSYCHIATRIC DISORDERS Depression Dementia    GI/Hepatic Neg liver ROS, GERD  Medicated,  Endo/Other  diabetes, Type 2, Insulin DependentObesity   Renal/GU ESRF and DialysisRenal disease (TTHSAT)     Musculoskeletal  (+) Arthritis ,   Abdominal   Peds  Hematology negative hematology ROS (+)   Anesthesia Other Findings Day of surgery medications reviewed with the patient.  Reproductive/Obstetrics                             Anesthesia Physical Anesthesia Plan  ASA: 3  Anesthesia Plan: General   Post-op Pain Management: Tylenol PO (pre-op)   Induction: Intravenous  PONV Risk Score and Plan: 3 and Dexamethasone and Ondansetron  Airway Management Planned: LMA  Additional Equipment:   Intra-op Plan:   Post-operative Plan: Extubation in OR  Informed Consent: I have reviewed the patients History and Physical, chart, labs and discussed the procedure including the risks, benefits and alternatives for the proposed anesthesia with the patient or authorized representative who has indicated his/her understanding and acceptance.     Dental advisory given  Plan Discussed with: CRNA  Anesthesia Plan Comments:         Anesthesia Quick Evaluation

## 2022-02-03 NOTE — Op Note (Addendum)
° ° °  Patient name: Katie Garcia MRN: [id number] DOB: 29-Jun-1941 Sex: female  02/03/2022 Pre-operative Diagnosis: esrd Post-operative diagnosis:  Same Surgeon:  Erlene Quan C. Donzetta Matters, MD Assistant: Arlee Muslim, PA Procedure Performed:  Left arm brachial artery to basilic vein AV fistula   Indications: 81 year old female history of end-stage renal disease multiple bilateral upper extremity access procedures that are failed.  She is now indicated for left arm AV fistula versus graft.  An experienced assistant was required given the complexity of this procedure and the standard of surgical care.  He helped with exposure using counter tension and suctioning. He expedited the anastomosis by following the suture.  Findings: There was a basilic vein that it was easily dilated to 4 mm.  The brachial artery was actually healthy and measured 4 mm.  At completion there was a strong thrill in the basilic vein and a weakly palpable radial artery pulse at the wrist.   Procedure:  The patient was identified in the holding area and taken to the operating room where she was placed supine operative table.  General anesthesia was induced.  She is sterilely prepped draped in the left upper extremity antibiotics were minister timeout was called.  Ultrasound was used to identify a suitable basilic vein above the antecubitum.  There was a previous longitudinal incision which was reopened.  We dissected down to the vein and protected the nerve.  The vein was dissected free branches divided between clips and ties and the vein was marked for orientation.  We dissected down through the significant scar tissue and this has not helped with counter tension.  We identified a previous graft distal to this we identified a brachial artery which was quite normal.  A vessel loop was placed around it there.  The vein was clamped distally and transected and was tied off with the help of the assistant.  Was spatulated dilated to 4 mm flushed  heparinized saline and clamped.  The arteries clamped distally proximally opened longitudinally flushed with heparinized saline both directions.  The vein was sewn end-to-side with 6-0 Prolene suture with the assistant following the anastomosis to maintain tension.  Prior to completion we allowed flushing all directions.  Upon completion there was very strong thrill in the vein confirmed with Doppler and a weakly palpable radial artery pulse at the wrist.  The wound was irrigated, hemostasis was obtained we closed in layers with Vicryl and Monocryl.  Dermabond was placed at the skin level.  She was awakened from anesthesia having tolerated procedure well without any complication.  All counts were correct at completion..   EBL: 25cc   Jaislyn Blinn C. Donzetta Matters, MD [facility] Office: [phone] Pager: [phone]

## 2022-02-04 DIAGNOSIS — D631 Anemia in chronic kidney disease: Secondary | ICD-10-CM | POA: Diagnosis not present

## 2022-02-04 DIAGNOSIS — Z992 Dependence on renal dialysis: Secondary | ICD-10-CM | POA: Diagnosis not present

## 2022-02-04 DIAGNOSIS — D509 Iron deficiency anemia, unspecified: Secondary | ICD-10-CM | POA: Diagnosis not present

## 2022-02-04 DIAGNOSIS — N2581 Secondary hyperparathyroidism of renal origin: Secondary | ICD-10-CM | POA: Diagnosis not present

## 2022-02-04 DIAGNOSIS — N186 End stage renal disease: Secondary | ICD-10-CM | POA: Diagnosis not present

## 2022-02-04 DIAGNOSIS — E1129 Type 2 diabetes mellitus with other diabetic kidney complication: Secondary | ICD-10-CM | POA: Diagnosis not present

## 2022-02-04 DIAGNOSIS — T8249XA Other complication of vascular dialysis catheter, initial encounter: Secondary | ICD-10-CM | POA: Diagnosis not present

## 2022-02-04 DIAGNOSIS — D688 Other specified coagulation defects: Secondary | ICD-10-CM | POA: Diagnosis not present

## 2022-02-04 DIAGNOSIS — R519 Headache, unspecified: Secondary | ICD-10-CM | POA: Diagnosis not present

## 2022-02-04 NOTE — Anesthesia Postprocedure Evaluation (Signed)
Anesthesia Post Note  Patient: Katie Garcia  Procedure(s) Performed: LEFT UPPER ARM ARTERIOVENOUS FISTULA CREATION (Left: Arm Upper)     Patient location during evaluation: PACU Anesthesia Type: General Level of consciousness: awake and alert Pain management: pain level controlled Vital Signs Assessment: post-procedure vital signs reviewed and stable Respiratory status: spontaneous breathing, nonlabored ventilation, respiratory function stable and patient connected to nasal cannula oxygen Cardiovascular status: blood pressure returned to baseline and stable Postop Assessment: no apparent nausea or vomiting Anesthetic complications: no   No notable events documented.  Last Vitals:  Vitals:   02/03/22 1334 02/03/22 1349  BP: (!) 139/56 (!) 141/55  Pulse: 74 73  Resp: 18 13  Temp:    SpO2: 99% 100%    Last Pain:  Vitals:   02/03/22 1350  TempSrc:   PainSc: (P) 0-No pain                 Santa Lighter

## 2022-02-05 ENCOUNTER — Encounter (HOSPITAL_COMMUNITY): Payer: Self-pay | Admitting: Vascular Surgery

## 2022-02-07 ENCOUNTER — Other Ambulatory Visit (HOSPITAL_COMMUNITY): Payer: Self-pay | Admitting: Nephrology

## 2022-02-07 DIAGNOSIS — E1129 Type 2 diabetes mellitus with other diabetic kidney complication: Secondary | ICD-10-CM | POA: Diagnosis not present

## 2022-02-07 DIAGNOSIS — Z992 Dependence on renal dialysis: Secondary | ICD-10-CM | POA: Diagnosis not present

## 2022-02-07 DIAGNOSIS — T8249XA Other complication of vascular dialysis catheter, initial encounter: Secondary | ICD-10-CM | POA: Diagnosis not present

## 2022-02-07 DIAGNOSIS — N186 End stage renal disease: Secondary | ICD-10-CM | POA: Diagnosis not present

## 2022-02-07 DIAGNOSIS — R519 Headache, unspecified: Secondary | ICD-10-CM | POA: Diagnosis not present

## 2022-02-07 DIAGNOSIS — N2581 Secondary hyperparathyroidism of renal origin: Secondary | ICD-10-CM | POA: Diagnosis not present

## 2022-02-07 DIAGNOSIS — D631 Anemia in chronic kidney disease: Secondary | ICD-10-CM | POA: Diagnosis not present

## 2022-02-07 DIAGNOSIS — D509 Iron deficiency anemia, unspecified: Secondary | ICD-10-CM | POA: Diagnosis not present

## 2022-02-07 DIAGNOSIS — D688 Other specified coagulation defects: Secondary | ICD-10-CM | POA: Diagnosis not present

## 2022-02-09 ENCOUNTER — Other Ambulatory Visit (HOSPITAL_COMMUNITY): Payer: Self-pay | Admitting: Nephrology

## 2022-02-09 ENCOUNTER — Ambulatory Visit (HOSPITAL_COMMUNITY)
Admission: RE | Admit: 2022-02-09 | Discharge: 2022-02-09 | Disposition: A | Discharge status: Home / Self Care | Payer: Medicare Other | Source: Ambulatory Visit | Attending: Nephrology | Admitting: Nephrology

## 2022-02-09 ENCOUNTER — Other Ambulatory Visit: Payer: Self-pay

## 2022-02-09 DIAGNOSIS — Z992 Dependence on renal dialysis: Secondary | ICD-10-CM | POA: Diagnosis not present

## 2022-02-09 DIAGNOSIS — N186 End stage renal disease: Secondary | ICD-10-CM

## 2022-02-09 DIAGNOSIS — N2581 Secondary hyperparathyroidism of renal origin: Secondary | ICD-10-CM | POA: Diagnosis not present

## 2022-02-09 DIAGNOSIS — D631 Anemia in chronic kidney disease: Secondary | ICD-10-CM | POA: Diagnosis not present

## 2022-02-09 DIAGNOSIS — R519 Headache, unspecified: Secondary | ICD-10-CM | POA: Diagnosis not present

## 2022-02-09 DIAGNOSIS — E1129 Type 2 diabetes mellitus with other diabetic kidney complication: Secondary | ICD-10-CM | POA: Diagnosis not present

## 2022-02-09 DIAGNOSIS — D509 Iron deficiency anemia, unspecified: Secondary | ICD-10-CM | POA: Diagnosis not present

## 2022-02-09 DIAGNOSIS — D688 Other specified coagulation defects: Secondary | ICD-10-CM | POA: Diagnosis not present

## 2022-02-09 DIAGNOSIS — T8241XA Breakdown (mechanical) of vascular dialysis catheter, initial encounter: Secondary | ICD-10-CM | POA: Diagnosis not present

## 2022-02-09 DIAGNOSIS — T8249XA Other complication of vascular dialysis catheter, initial encounter: Secondary | ICD-10-CM | POA: Diagnosis not present

## 2022-02-09 HISTORY — PX: IR PTA VENOUS EXCEPT DIALYSIS CIRCUIT: IMG6126

## 2022-02-09 HISTORY — PX: IR VENOCAVAGRAM SVC: IMG679

## 2022-02-09 HISTORY — PX: IR FLUORO GUIDE CV LINE LEFT: IMG2282

## 2022-02-09 MED ORDER — HEPARIN SODIUM (PORCINE) 1000 UNIT/ML IJ SOLN
INTRAMUSCULAR | Status: AC
  Filled 2022-02-09: qty 10

## 2022-02-09 MED ORDER — HEPARIN SODIUM (PORCINE) 1000 UNIT/ML IJ SOLN
INTRAMUSCULAR | Status: DC | PRN
  Administered 2022-02-09: 3800 [IU] via INTRAVENOUS

## 2022-02-09 MED ORDER — LIDOCAINE HCL 1 % IJ SOLN
INTRAMUSCULAR | Status: DC | PRN
  Administered 2022-02-09: 10 mL via INTRADERMAL

## 2022-02-09 MED ORDER — VANCOMYCIN HCL IN DEXTROSE 1-5 GM/200ML-% IV SOLN
INTRAVENOUS | Status: AC
  Filled 2022-02-09: qty 200

## 2022-02-09 MED ORDER — LIDOCAINE HCL 1 % IJ SOLN
INTRAMUSCULAR | Status: AC
  Filled 2022-02-09: qty 20

## 2022-02-09 MED ORDER — VANCOMYCIN HCL IN DEXTROSE 1-5 GM/200ML-% IV SOLN
1000.0000 mg | INTRAVENOUS | Status: AC
  Administered 2022-02-09: 1000 mg via INTRAVENOUS

## 2022-02-09 NOTE — Procedures (Signed)
Interventional Radiology Procedure Note  Procedure: 1. Exchange of left IJV Tunneled HD catheter 2. Fibrin sheath plasty 3. Venogram  Indication: Non-functioning HD catheter  Findings: Please refer to procedural dictation for full description.  Complications: None  EBL: < 10 mL  Miachel Roux, MD [phone]

## 2022-02-11 DIAGNOSIS — N2581 Secondary hyperparathyroidism of renal origin: Secondary | ICD-10-CM | POA: Diagnosis not present

## 2022-02-11 DIAGNOSIS — T8249XA Other complication of vascular dialysis catheter, initial encounter: Secondary | ICD-10-CM | POA: Diagnosis not present

## 2022-02-11 DIAGNOSIS — D688 Other specified coagulation defects: Secondary | ICD-10-CM | POA: Diagnosis not present

## 2022-02-11 DIAGNOSIS — D631 Anemia in chronic kidney disease: Secondary | ICD-10-CM | POA: Diagnosis not present

## 2022-02-11 DIAGNOSIS — Z992 Dependence on renal dialysis: Secondary | ICD-10-CM | POA: Diagnosis not present

## 2022-02-11 DIAGNOSIS — E1129 Type 2 diabetes mellitus with other diabetic kidney complication: Secondary | ICD-10-CM | POA: Diagnosis not present

## 2022-02-11 DIAGNOSIS — R519 Headache, unspecified: Secondary | ICD-10-CM | POA: Diagnosis not present

## 2022-02-11 DIAGNOSIS — D509 Iron deficiency anemia, unspecified: Secondary | ICD-10-CM | POA: Diagnosis not present

## 2022-02-11 DIAGNOSIS — N186 End stage renal disease: Secondary | ICD-10-CM | POA: Diagnosis not present

## 2022-02-14 DIAGNOSIS — N186 End stage renal disease: Secondary | ICD-10-CM | POA: Diagnosis not present

## 2022-02-14 DIAGNOSIS — D631 Anemia in chronic kidney disease: Secondary | ICD-10-CM | POA: Diagnosis not present

## 2022-02-14 DIAGNOSIS — R519 Headache, unspecified: Secondary | ICD-10-CM | POA: Diagnosis not present

## 2022-02-14 DIAGNOSIS — D509 Iron deficiency anemia, unspecified: Secondary | ICD-10-CM | POA: Diagnosis not present

## 2022-02-14 DIAGNOSIS — E1129 Type 2 diabetes mellitus with other diabetic kidney complication: Secondary | ICD-10-CM | POA: Diagnosis not present

## 2022-02-14 DIAGNOSIS — N2581 Secondary hyperparathyroidism of renal origin: Secondary | ICD-10-CM | POA: Diagnosis not present

## 2022-02-14 DIAGNOSIS — D688 Other specified coagulation defects: Secondary | ICD-10-CM | POA: Diagnosis not present

## 2022-02-14 DIAGNOSIS — Z992 Dependence on renal dialysis: Secondary | ICD-10-CM | POA: Diagnosis not present

## 2022-02-14 DIAGNOSIS — T8249XA Other complication of vascular dialysis catheter, initial encounter: Secondary | ICD-10-CM | POA: Diagnosis not present

## 2022-02-16 DIAGNOSIS — T8249XA Other complication of vascular dialysis catheter, initial encounter: Secondary | ICD-10-CM | POA: Diagnosis not present

## 2022-02-16 DIAGNOSIS — N2581 Secondary hyperparathyroidism of renal origin: Secondary | ICD-10-CM | POA: Diagnosis not present

## 2022-02-16 DIAGNOSIS — D631 Anemia in chronic kidney disease: Secondary | ICD-10-CM | POA: Diagnosis not present

## 2022-02-16 DIAGNOSIS — D509 Iron deficiency anemia, unspecified: Secondary | ICD-10-CM | POA: Diagnosis not present

## 2022-02-16 DIAGNOSIS — D688 Other specified coagulation defects: Secondary | ICD-10-CM | POA: Diagnosis not present

## 2022-02-16 DIAGNOSIS — N186 End stage renal disease: Secondary | ICD-10-CM | POA: Diagnosis not present

## 2022-02-16 DIAGNOSIS — Z992 Dependence on renal dialysis: Secondary | ICD-10-CM | POA: Diagnosis not present

## 2022-02-16 DIAGNOSIS — E1129 Type 2 diabetes mellitus with other diabetic kidney complication: Secondary | ICD-10-CM | POA: Diagnosis not present

## 2022-02-16 DIAGNOSIS — R519 Headache, unspecified: Secondary | ICD-10-CM | POA: Diagnosis not present

## 2022-02-18 DIAGNOSIS — D688 Other specified coagulation defects: Secondary | ICD-10-CM | POA: Diagnosis not present

## 2022-02-18 DIAGNOSIS — R519 Headache, unspecified: Secondary | ICD-10-CM | POA: Diagnosis not present

## 2022-02-18 DIAGNOSIS — T8249XA Other complication of vascular dialysis catheter, initial encounter: Secondary | ICD-10-CM | POA: Diagnosis not present

## 2022-02-18 DIAGNOSIS — E1129 Type 2 diabetes mellitus with other diabetic kidney complication: Secondary | ICD-10-CM | POA: Diagnosis not present

## 2022-02-18 DIAGNOSIS — N186 End stage renal disease: Secondary | ICD-10-CM | POA: Diagnosis not present

## 2022-02-18 DIAGNOSIS — N2581 Secondary hyperparathyroidism of renal origin: Secondary | ICD-10-CM | POA: Diagnosis not present

## 2022-02-18 DIAGNOSIS — Z992 Dependence on renal dialysis: Secondary | ICD-10-CM | POA: Diagnosis not present

## 2022-02-18 DIAGNOSIS — D509 Iron deficiency anemia, unspecified: Secondary | ICD-10-CM | POA: Diagnosis not present

## 2022-02-18 DIAGNOSIS — D631 Anemia in chronic kidney disease: Secondary | ICD-10-CM | POA: Diagnosis not present

## 2022-02-21 DIAGNOSIS — T8249XA Other complication of vascular dialysis catheter, initial encounter: Secondary | ICD-10-CM | POA: Diagnosis not present

## 2022-02-21 DIAGNOSIS — E1122 Type 2 diabetes mellitus with diabetic chronic kidney disease: Secondary | ICD-10-CM | POA: Diagnosis not present

## 2022-02-21 DIAGNOSIS — N186 End stage renal disease: Secondary | ICD-10-CM | POA: Diagnosis not present

## 2022-02-21 DIAGNOSIS — D509 Iron deficiency anemia, unspecified: Secondary | ICD-10-CM | POA: Diagnosis not present

## 2022-02-21 DIAGNOSIS — Z992 Dependence on renal dialysis: Secondary | ICD-10-CM | POA: Diagnosis not present

## 2022-02-21 DIAGNOSIS — D631 Anemia in chronic kidney disease: Secondary | ICD-10-CM | POA: Diagnosis not present

## 2022-02-21 DIAGNOSIS — D688 Other specified coagulation defects: Secondary | ICD-10-CM | POA: Diagnosis not present

## 2022-02-21 DIAGNOSIS — R519 Headache, unspecified: Secondary | ICD-10-CM | POA: Diagnosis not present

## 2022-02-21 DIAGNOSIS — E1129 Type 2 diabetes mellitus with other diabetic kidney complication: Secondary | ICD-10-CM | POA: Diagnosis not present

## 2022-02-21 DIAGNOSIS — N2581 Secondary hyperparathyroidism of renal origin: Secondary | ICD-10-CM | POA: Diagnosis not present

## 2022-02-23 DIAGNOSIS — D631 Anemia in chronic kidney disease: Secondary | ICD-10-CM | POA: Diagnosis not present

## 2022-02-23 DIAGNOSIS — T8249XA Other complication of vascular dialysis catheter, initial encounter: Secondary | ICD-10-CM | POA: Diagnosis not present

## 2022-02-23 DIAGNOSIS — N2581 Secondary hyperparathyroidism of renal origin: Secondary | ICD-10-CM | POA: Diagnosis not present

## 2022-02-23 DIAGNOSIS — D688 Other specified coagulation defects: Secondary | ICD-10-CM | POA: Diagnosis not present

## 2022-02-23 DIAGNOSIS — Z992 Dependence on renal dialysis: Secondary | ICD-10-CM | POA: Diagnosis not present

## 2022-02-23 DIAGNOSIS — N186 End stage renal disease: Secondary | ICD-10-CM | POA: Diagnosis not present

## 2022-02-23 DIAGNOSIS — R519 Headache, unspecified: Secondary | ICD-10-CM | POA: Diagnosis not present

## 2022-02-23 DIAGNOSIS — D509 Iron deficiency anemia, unspecified: Secondary | ICD-10-CM | POA: Diagnosis not present

## 2022-02-23 DIAGNOSIS — E1129 Type 2 diabetes mellitus with other diabetic kidney complication: Secondary | ICD-10-CM | POA: Diagnosis not present

## 2022-02-25 DIAGNOSIS — T8249XA Other complication of vascular dialysis catheter, initial encounter: Secondary | ICD-10-CM | POA: Diagnosis not present

## 2022-02-25 DIAGNOSIS — D688 Other specified coagulation defects: Secondary | ICD-10-CM | POA: Diagnosis not present

## 2022-02-25 DIAGNOSIS — N2581 Secondary hyperparathyroidism of renal origin: Secondary | ICD-10-CM | POA: Diagnosis not present

## 2022-02-25 DIAGNOSIS — D509 Iron deficiency anemia, unspecified: Secondary | ICD-10-CM | POA: Diagnosis not present

## 2022-02-25 DIAGNOSIS — R519 Headache, unspecified: Secondary | ICD-10-CM | POA: Diagnosis not present

## 2022-02-25 DIAGNOSIS — D631 Anemia in chronic kidney disease: Secondary | ICD-10-CM | POA: Diagnosis not present

## 2022-02-25 DIAGNOSIS — Z992 Dependence on renal dialysis: Secondary | ICD-10-CM | POA: Diagnosis not present

## 2022-02-25 DIAGNOSIS — E1129 Type 2 diabetes mellitus with other diabetic kidney complication: Secondary | ICD-10-CM | POA: Diagnosis not present

## 2022-02-25 DIAGNOSIS — N186 End stage renal disease: Secondary | ICD-10-CM | POA: Diagnosis not present

## 2022-02-28 DIAGNOSIS — D631 Anemia in chronic kidney disease: Secondary | ICD-10-CM | POA: Diagnosis not present

## 2022-02-28 DIAGNOSIS — E1129 Type 2 diabetes mellitus with other diabetic kidney complication: Secondary | ICD-10-CM | POA: Diagnosis not present

## 2022-02-28 DIAGNOSIS — Z992 Dependence on renal dialysis: Secondary | ICD-10-CM | POA: Diagnosis not present

## 2022-02-28 DIAGNOSIS — N186 End stage renal disease: Secondary | ICD-10-CM | POA: Diagnosis not present

## 2022-02-28 DIAGNOSIS — D688 Other specified coagulation defects: Secondary | ICD-10-CM | POA: Diagnosis not present

## 2022-02-28 DIAGNOSIS — T8249XA Other complication of vascular dialysis catheter, initial encounter: Secondary | ICD-10-CM | POA: Diagnosis not present

## 2022-02-28 DIAGNOSIS — N2581 Secondary hyperparathyroidism of renal origin: Secondary | ICD-10-CM | POA: Diagnosis not present

## 2022-02-28 DIAGNOSIS — R519 Headache, unspecified: Secondary | ICD-10-CM | POA: Diagnosis not present

## 2022-02-28 DIAGNOSIS — D509 Iron deficiency anemia, unspecified: Secondary | ICD-10-CM | POA: Diagnosis not present

## 2022-03-02 DIAGNOSIS — N186 End stage renal disease: Secondary | ICD-10-CM | POA: Diagnosis not present

## 2022-03-02 DIAGNOSIS — R519 Headache, unspecified: Secondary | ICD-10-CM | POA: Diagnosis not present

## 2022-03-02 DIAGNOSIS — T8249XA Other complication of vascular dialysis catheter, initial encounter: Secondary | ICD-10-CM | POA: Diagnosis not present

## 2022-03-02 DIAGNOSIS — D688 Other specified coagulation defects: Secondary | ICD-10-CM | POA: Diagnosis not present

## 2022-03-02 DIAGNOSIS — Z992 Dependence on renal dialysis: Secondary | ICD-10-CM | POA: Diagnosis not present

## 2022-03-02 DIAGNOSIS — D631 Anemia in chronic kidney disease: Secondary | ICD-10-CM | POA: Diagnosis not present

## 2022-03-02 DIAGNOSIS — E1129 Type 2 diabetes mellitus with other diabetic kidney complication: Secondary | ICD-10-CM | POA: Diagnosis not present

## 2022-03-02 DIAGNOSIS — N2581 Secondary hyperparathyroidism of renal origin: Secondary | ICD-10-CM | POA: Diagnosis not present

## 2022-03-02 DIAGNOSIS — D509 Iron deficiency anemia, unspecified: Secondary | ICD-10-CM | POA: Diagnosis not present

## 2022-03-04 DIAGNOSIS — T8249XA Other complication of vascular dialysis catheter, initial encounter: Secondary | ICD-10-CM | POA: Diagnosis not present

## 2022-03-04 DIAGNOSIS — N2581 Secondary hyperparathyroidism of renal origin: Secondary | ICD-10-CM | POA: Diagnosis not present

## 2022-03-04 DIAGNOSIS — N186 End stage renal disease: Secondary | ICD-10-CM | POA: Diagnosis not present

## 2022-03-04 DIAGNOSIS — Z992 Dependence on renal dialysis: Secondary | ICD-10-CM | POA: Diagnosis not present

## 2022-03-04 DIAGNOSIS — D631 Anemia in chronic kidney disease: Secondary | ICD-10-CM | POA: Diagnosis not present

## 2022-03-04 DIAGNOSIS — E1129 Type 2 diabetes mellitus with other diabetic kidney complication: Secondary | ICD-10-CM | POA: Diagnosis not present

## 2022-03-04 DIAGNOSIS — R519 Headache, unspecified: Secondary | ICD-10-CM | POA: Diagnosis not present

## 2022-03-04 DIAGNOSIS — D509 Iron deficiency anemia, unspecified: Secondary | ICD-10-CM | POA: Diagnosis not present

## 2022-03-04 DIAGNOSIS — D688 Other specified coagulation defects: Secondary | ICD-10-CM | POA: Diagnosis not present

## 2022-03-07 DIAGNOSIS — D631 Anemia in chronic kidney disease: Secondary | ICD-10-CM | POA: Diagnosis not present

## 2022-03-07 DIAGNOSIS — R519 Headache, unspecified: Secondary | ICD-10-CM | POA: Diagnosis not present

## 2022-03-07 DIAGNOSIS — D509 Iron deficiency anemia, unspecified: Secondary | ICD-10-CM | POA: Diagnosis not present

## 2022-03-07 DIAGNOSIS — N186 End stage renal disease: Secondary | ICD-10-CM | POA: Diagnosis not present

## 2022-03-07 DIAGNOSIS — T8249XA Other complication of vascular dialysis catheter, initial encounter: Secondary | ICD-10-CM | POA: Diagnosis not present

## 2022-03-07 DIAGNOSIS — E1129 Type 2 diabetes mellitus with other diabetic kidney complication: Secondary | ICD-10-CM | POA: Diagnosis not present

## 2022-03-07 DIAGNOSIS — D688 Other specified coagulation defects: Secondary | ICD-10-CM | POA: Diagnosis not present

## 2022-03-07 DIAGNOSIS — N2581 Secondary hyperparathyroidism of renal origin: Secondary | ICD-10-CM | POA: Diagnosis not present

## 2022-03-07 DIAGNOSIS — Z992 Dependence on renal dialysis: Secondary | ICD-10-CM | POA: Diagnosis not present

## 2022-03-09 ENCOUNTER — Encounter: Payer: Self-pay | Admitting: Endocrinology

## 2022-03-09 ENCOUNTER — Ambulatory Visit (INDEPENDENT_AMBULATORY_CARE_PROVIDER_SITE_OTHER): Payer: Medicare Other | Admitting: Endocrinology

## 2022-03-09 ENCOUNTER — Other Ambulatory Visit: Payer: Self-pay

## 2022-03-09 VITALS — BP 118/84 | HR 71 | Ht 60.0 in | Wt 191.8 lb

## 2022-03-09 DIAGNOSIS — D509 Iron deficiency anemia, unspecified: Secondary | ICD-10-CM | POA: Diagnosis not present

## 2022-03-09 DIAGNOSIS — T8249XA Other complication of vascular dialysis catheter, initial encounter: Secondary | ICD-10-CM | POA: Diagnosis not present

## 2022-03-09 DIAGNOSIS — D688 Other specified coagulation defects: Secondary | ICD-10-CM | POA: Diagnosis not present

## 2022-03-09 DIAGNOSIS — N186 End stage renal disease: Secondary | ICD-10-CM | POA: Diagnosis not present

## 2022-03-09 DIAGNOSIS — E1129 Type 2 diabetes mellitus with other diabetic kidney complication: Secondary | ICD-10-CM | POA: Diagnosis not present

## 2022-03-09 DIAGNOSIS — E1151 Type 2 diabetes mellitus with diabetic peripheral angiopathy without gangrene: Secondary | ICD-10-CM | POA: Diagnosis not present

## 2022-03-09 DIAGNOSIS — D631 Anemia in chronic kidney disease: Secondary | ICD-10-CM | POA: Diagnosis not present

## 2022-03-09 DIAGNOSIS — R519 Headache, unspecified: Secondary | ICD-10-CM | POA: Diagnosis not present

## 2022-03-09 DIAGNOSIS — Z992 Dependence on renal dialysis: Secondary | ICD-10-CM | POA: Diagnosis not present

## 2022-03-09 DIAGNOSIS — N2581 Secondary hyperparathyroidism of renal origin: Secondary | ICD-10-CM | POA: Diagnosis not present

## 2022-03-09 LAB — POCT GLYCOSYLATED HEMOGLOBIN (HGB A1C): Hemoglobin A1C: 8.9 % — AB (ref 4.0–5.6)

## 2022-03-09 NOTE — Patient Instructions (Addendum)
check your blood sugar twice a day.  vary the time of day when you check, between before the 3 meals, and at bedtime.  also check if you have symptoms of your blood sugar being too high or too low.  please keep a record of the readings and bring it to your next appointment here (or you can bring the meter itself).  You can write it on any piece of paper.  please call us sooner if your blood sugar goes below 70, or if you have a lot of readings over 200.   ?You should take both insulins right before your first meal of the day, whenever that is. ?On this type of insulin schedule, you should eat meals on a regular schedule (especially lunch).  If a meal is missed or significantly delayed, your blood sugar could go low.   ?Here is a new identical meter, for your use only.   ?It is really important to take the insulin each day.   ?Please come back for a follow-up appointment in 2 months.   ? ?

## 2022-03-09 NOTE — Progress Notes (Signed)
? ?Subjective:  ? ? Patient ID: Katie Garcia, female    DOB: [birth year], 81 y.o.   MRN: [id number] ? ?HPI ?Pt returns for f/u of diabetes mellitus:  ?DM type: Insulin-requiring type 2 (but vitiligo suggests type 1).   ?Dx'ed: 1980 ?Complications: PN, DR, and ESRD (on HD).  ?Therapy: insulin since 2004.   ?GDM: 1970 ?DKA: never.   ?Severe hypoglycemia: last episode was 2017.    ?Pancreatitis: never.   ?Pancreatic imaging: never.  ?SDOH: she takes human insulin, due to cost.  On HD days (morning), she takes insulin with breakfast when she gets home.   ?Other: she takes QD insulin, after poor results with multiple daily injections; she was changed to 70/30 qam, then N+R, both QAM, due to pattern of cbg's; fructosamine has confirmed A1c; she declines GLP and SGLT.   ?Interval history: she has hypoglycemia approx 1/month. these episodes are mild; this happens in the middle of the night. she brings her cbg meter.  However, she shares the meter with someone else.  No recent steroids.  Pt says she sometimes forgets to take.   ?Past Medical History:  ?Diagnosis Date  ? Anemia   ? Arthritis   ? Dementia ([place])   ? Diabetes mellitus without complication ([place])   ? ESRD   ? Dialysis T/Th/Sa at [facility]  ? GERD (gastroesophageal reflux disease)   ? Gout   ? Headache   ? Hypertension   ? Type 2 diabetes mellitus ([place])   ? ? ?Past Surgical History:  ?Procedure Laterality Date  ? ABDOMINAL HYSTERECTOMY    ? AV FISTULA PLACEMENT Left 10/02/2016  ? Procedure: ARTERIOVENOUS (AV) FISTULA CREATION LEFT UPPER ARM;  Surgeon: Waynetta Sandy, MD;  Location: [facility];  Service: Vascular;  Laterality: Left;  ? AV FISTULA PLACEMENT Right 07/14/2020  ? Procedure: RIGHT ARM ARTERIOVENOUS FISTULA CREATION;  Surgeon: Waynetta Sandy, MD;  Location: [facility];  Service: Vascular;  Laterality: Right;  ? AV FISTULA PLACEMENT Left 05/16/2021  ? Procedure: INSERTION OF LEFT UPPER ARM ARTERIOVENOUS (AV) GORE-TEX GRAFT;  Surgeon: Marty Heck, MD;  Location: [facility];  Service: Vascular;  Laterality: Left;  ? AV FISTULA PLACEMENT Right 07/13/2021  ? Procedure: RIGHT ARM ARTERIOVENOUS (AV) FISTULA GRAFT INSERTION;  Surgeon: Waynetta Sandy, MD;  Location: [facility];  Service: Vascular;  Laterality: Right;  ? AV FISTULA PLACEMENT Left 02/03/2022  ? Procedure: LEFT UPPER ARM ARTERIOVENOUS FISTULA CREATION;  Surgeon: Waynetta Sandy, MD;  Location: [facility];  Service: Vascular;  Laterality: Left;  ? BASCILIC VEIN TRANSPOSITION Left 05/17/2020  ? Procedure: Insertion of Left arm arteriovenous gortex graftarm ;  Surgeon: Angelia Mould, MD;  Location: [facility];  Service: Vascular;  Laterality: Left;  ? CESAREAN SECTION    ? DIALYSIS/PERMA CATHETER INSERTION    ? IR FLUORO GUIDE CV LINE LEFT  02/09/2022  ? IR PTA VENOUS EXCEPT DIALYSIS CIRCUIT  02/09/2022  ? IR VENOCAVAGRAM SVC  02/09/2022  ? REVISON OF ARTERIOVENOUS FISTULA Right 09/15/2020  ? Procedure: REVISON OF RIGHT UPPER ARM  ARTERIOVENOUS FISTULA;  Surgeon: Waynetta Sandy, MD;  Location: [facility];  Service: Vascular;  Laterality: Right;  ? UPPER EXTREMITY VENOGRAPHY Bilateral 04/18/2021  ? Procedure: UPPER EXTREMITY VENOGRAPHY;  Surgeon: Waynetta Sandy, MD;  Location: [facility];  Service: Cardiovascular;  Laterality: Bilateral;  ? UPPER EXTREMITY VENOGRAPHY Left 01/23/2022  ? Procedure: UPPER EXTREMITY VENOGRAPHY;  Surgeon: Waynetta Sandy, MD;  Location: [facility]  INVASIVE CV LAB;  Service: Cardiovascular;  Laterality: Left;  ? ? ?Social History  ? ?Socioeconomic History  ? Marital status: Married  ?  Spouse name: Not on file  ? Number of children: Not on file  ? Years of education: Not on file  ? Highest education level: Not on file  ?Occupational History  ? Not on file  ?Tobacco Use  ? Smoking status: Never  ? Smokeless tobacco: Never  ?Vaping Use  ? Vaping Use: Never used  ?Substance and Sexual Activity  ? Alcohol use: Not Currently  ? Drug use: No   ? Sexual activity: Not on file  ?Other Topics Concern  ? Not on file  ?Social History Narrative  ? Not on file  ? ?Social Determinants of Health  ? ?Financial Resource Strain: Not on file  ?Food Insecurity: Not on file  ?Transportation Needs: Not on file  ?Physical Activity: Not on file  ?Stress: Not on file  ?Social Connections: Not on file  ?Intimate Partner Violence: Not on file  ? ? ?Current Outpatient Medications on File Prior to Visit  ?Medication Sig Dispense Refill  ? Accu-Chek FastClix Lancets MISC     ? ACCU-CHEK GUIDE test strip 1 each by Other route in the morning and at bedtime. E11.22  1  ? acetaminophen (TYLENOL) 500 MG tablet Take 500-1,000 mg by mouth every 6 (six) hours as needed for moderate pain or headache.    ? allopurinol (ZYLOPRIM) 100 MG tablet Take 100 mg by mouth every other day. In the morning.    ? B Complex-C-Zn-Folic Acid (DIALYVITE 182-XHBZ 15) 0.8 MG TABS Take 1 tablet by mouth daily with supper.    ? Blood Glucose Monitoring Suppl (ACCU-CHEK GUIDE) w/Device KIT     ? calcium acetate (PHOSLO) 667 MG capsule Take 667 mg by mouth 3 (three) times daily with meals.  3  ? carvedilol (COREG) 25 MG tablet Take 25 mg by mouth in the morning and at bedtime.     ? clindamycin (CLEOCIN) 300 MG capsule Take 1 capsule (300 mg total) by mouth 3 (three) times daily. Do not take on morning of hemodialysis treatment days 25 capsule 0  ? famotidine (PEPCID) 20 MG tablet Take 20 mg by mouth 2 (two) times daily.    ? guaiFENesin (MUCINEX) 600 MG 12 hr tablet Take 600 mg by mouth daily as needed for to loosen phlegm.    ? guaifenesin (ROBITUSSIN) 100 MG/5ML syrup Take 100 mg by mouth 3 (three) times daily as needed for cough.    ? HYDROcodone-acetaminophen (NORCO) 5-325 MG tablet Take 1 tablet by mouth every 6 (six) hours as needed for moderate pain. 10 tablet 0  ? insulin NPH Human (NOVOLIN N) 100 UNIT/ML injection Inject 0.15 mLs (15 Units total) into the skin daily before breakfast. (Patient taking  differently: Inject 15 Units into the skin every evening.) 20 mL 3  ? insulin regular (NOVOLIN R) 100 units/mL injection Inject 0.15 mLs (15 Units total) into the skin daily. And syringes 1/day 20 mL 3  ? iron sucrose in sodium chloride 0.9 % 100 mL Iron Sucrose (Venofer)    ? loratadine (CLARITIN) 10 MG tablet Take 10 mg by mouth daily.    ? meclizine (ANTIVERT) 25 MG tablet Take 1 tablet (25 mg total) by mouth 3 (three) times daily as needed for dizziness. 30 tablet 0  ? Methoxy PEG-Epoetin Beta (MIRCERA IJ) Mircera    ? Nutritional Supplements (FEEDING SUPPLEMENT, NEPRO CARB STEADY,) LIQD  Take 237 mLs by mouth every Tuesday, Thursday, and Saturday at 6 PM.    ? ofloxacin (OCUFLOX) 0.3 % ophthalmic solution Place 1 drop into the right eye in the morning, at noon, and at bedtime.    ? prednisoLONE acetate (PRED FORTE) 1 % ophthalmic suspension Place 1 drop into the right eye in the morning, at noon, and at bedtime.    ? RELION INSULIN SYR 0.5ML/31G 31G X 5/16" 0.5 ML MISC USE 1 ONCE DAILY    ? rosuvastatin (CRESTOR) 10 MG tablet Take 10 mg by mouth in the morning.    ? ?Current Facility-Administered Medications on File Prior to Visit  ?Medication Dose Route Frequency Provider Last Rate Last Admin  ? 0.9 %  sodium chloride infusion  250 mL Intravenous PRN Waynetta Sandy, MD      ? sodium chloride flush (NS) 0.9 % injection 3 mL  3 mL Intravenous Q12H Waynetta Sandy, MD      ? sodium chloride flush (NS) 0.9 % injection 3 mL  3 mL Intravenous PRN Waynetta Sandy, MD      ? ? ?Allergies  ?Allergen Reactions  ? Penicillins Rash  ?  Did it involve swelling of the face/tongue/throat, SOB, or low BP? No ?Did it involve sudden or severe rash/hives, skin peeling, or any reaction on the inside of your mouth or nose? Yes ?Did you need to seek medical attention at a hospital or doctor's office? N/A ?When did it last happen? N/A      ?If all above answers are "NO", may proceed with cephalosporin  use.  ? ? ?Family History  ?Problem Relation Age of Onset  ? Diabetes Mother   ? ? ?BP 118/84 (BP Location: Left Arm, Patient Position: Sitting, Cuff Size: Normal)   Pulse 71   Ht 5' (1.524 m)   Wt 191 lb 12.8

## 2022-03-11 DIAGNOSIS — D631 Anemia in chronic kidney disease: Secondary | ICD-10-CM | POA: Diagnosis not present

## 2022-03-11 DIAGNOSIS — D688 Other specified coagulation defects: Secondary | ICD-10-CM | POA: Diagnosis not present

## 2022-03-11 DIAGNOSIS — N186 End stage renal disease: Secondary | ICD-10-CM | POA: Diagnosis not present

## 2022-03-11 DIAGNOSIS — N2581 Secondary hyperparathyroidism of renal origin: Secondary | ICD-10-CM | POA: Diagnosis not present

## 2022-03-11 DIAGNOSIS — T8249XA Other complication of vascular dialysis catheter, initial encounter: Secondary | ICD-10-CM | POA: Diagnosis not present

## 2022-03-11 DIAGNOSIS — E1129 Type 2 diabetes mellitus with other diabetic kidney complication: Secondary | ICD-10-CM | POA: Diagnosis not present

## 2022-03-11 DIAGNOSIS — D509 Iron deficiency anemia, unspecified: Secondary | ICD-10-CM | POA: Diagnosis not present

## 2022-03-11 DIAGNOSIS — Z992 Dependence on renal dialysis: Secondary | ICD-10-CM | POA: Diagnosis not present

## 2022-03-11 DIAGNOSIS — R519 Headache, unspecified: Secondary | ICD-10-CM | POA: Diagnosis not present

## 2022-03-14 DIAGNOSIS — D631 Anemia in chronic kidney disease: Secondary | ICD-10-CM | POA: Diagnosis not present

## 2022-03-14 DIAGNOSIS — N2581 Secondary hyperparathyroidism of renal origin: Secondary | ICD-10-CM | POA: Diagnosis not present

## 2022-03-14 DIAGNOSIS — D509 Iron deficiency anemia, unspecified: Secondary | ICD-10-CM | POA: Diagnosis not present

## 2022-03-14 DIAGNOSIS — R519 Headache, unspecified: Secondary | ICD-10-CM | POA: Diagnosis not present

## 2022-03-14 DIAGNOSIS — E1129 Type 2 diabetes mellitus with other diabetic kidney complication: Secondary | ICD-10-CM | POA: Diagnosis not present

## 2022-03-14 DIAGNOSIS — D688 Other specified coagulation defects: Secondary | ICD-10-CM | POA: Diagnosis not present

## 2022-03-14 DIAGNOSIS — T8249XA Other complication of vascular dialysis catheter, initial encounter: Secondary | ICD-10-CM | POA: Diagnosis not present

## 2022-03-14 DIAGNOSIS — N186 End stage renal disease: Secondary | ICD-10-CM | POA: Diagnosis not present

## 2022-03-14 DIAGNOSIS — Z992 Dependence on renal dialysis: Secondary | ICD-10-CM | POA: Diagnosis not present

## 2022-03-16 DIAGNOSIS — E1129 Type 2 diabetes mellitus with other diabetic kidney complication: Secondary | ICD-10-CM | POA: Diagnosis not present

## 2022-03-16 DIAGNOSIS — D509 Iron deficiency anemia, unspecified: Secondary | ICD-10-CM | POA: Diagnosis not present

## 2022-03-16 DIAGNOSIS — T8249XA Other complication of vascular dialysis catheter, initial encounter: Secondary | ICD-10-CM | POA: Diagnosis not present

## 2022-03-16 DIAGNOSIS — N2581 Secondary hyperparathyroidism of renal origin: Secondary | ICD-10-CM | POA: Diagnosis not present

## 2022-03-16 DIAGNOSIS — D631 Anemia in chronic kidney disease: Secondary | ICD-10-CM | POA: Diagnosis not present

## 2022-03-16 DIAGNOSIS — R519 Headache, unspecified: Secondary | ICD-10-CM | POA: Diagnosis not present

## 2022-03-16 DIAGNOSIS — Z992 Dependence on renal dialysis: Secondary | ICD-10-CM | POA: Diagnosis not present

## 2022-03-16 DIAGNOSIS — D688 Other specified coagulation defects: Secondary | ICD-10-CM | POA: Diagnosis not present

## 2022-03-16 DIAGNOSIS — N186 End stage renal disease: Secondary | ICD-10-CM | POA: Diagnosis not present

## 2022-03-17 ENCOUNTER — Other Ambulatory Visit: Payer: Self-pay

## 2022-03-17 DIAGNOSIS — N186 End stage renal disease: Secondary | ICD-10-CM

## 2022-03-18 DIAGNOSIS — D631 Anemia in chronic kidney disease: Secondary | ICD-10-CM | POA: Diagnosis not present

## 2022-03-18 DIAGNOSIS — D688 Other specified coagulation defects: Secondary | ICD-10-CM | POA: Diagnosis not present

## 2022-03-18 DIAGNOSIS — N186 End stage renal disease: Secondary | ICD-10-CM | POA: Diagnosis not present

## 2022-03-18 DIAGNOSIS — R519 Headache, unspecified: Secondary | ICD-10-CM | POA: Diagnosis not present

## 2022-03-18 DIAGNOSIS — T8249XA Other complication of vascular dialysis catheter, initial encounter: Secondary | ICD-10-CM | POA: Diagnosis not present

## 2022-03-18 DIAGNOSIS — E1129 Type 2 diabetes mellitus with other diabetic kidney complication: Secondary | ICD-10-CM | POA: Diagnosis not present

## 2022-03-18 DIAGNOSIS — N2581 Secondary hyperparathyroidism of renal origin: Secondary | ICD-10-CM | POA: Diagnosis not present

## 2022-03-18 DIAGNOSIS — D509 Iron deficiency anemia, unspecified: Secondary | ICD-10-CM | POA: Diagnosis not present

## 2022-03-18 DIAGNOSIS — Z992 Dependence on renal dialysis: Secondary | ICD-10-CM | POA: Diagnosis not present

## 2022-03-20 ENCOUNTER — Ambulatory Visit (INDEPENDENT_AMBULATORY_CARE_PROVIDER_SITE_OTHER): Payer: Medicare Other | Admitting: Podiatry

## 2022-03-20 DIAGNOSIS — Z91199 Patient's noncompliance with other medical treatment and regimen due to unspecified reason: Secondary | ICD-10-CM

## 2022-03-21 DIAGNOSIS — Z992 Dependence on renal dialysis: Secondary | ICD-10-CM | POA: Diagnosis not present

## 2022-03-21 DIAGNOSIS — D688 Other specified coagulation defects: Secondary | ICD-10-CM | POA: Diagnosis not present

## 2022-03-21 DIAGNOSIS — N186 End stage renal disease: Secondary | ICD-10-CM | POA: Diagnosis not present

## 2022-03-21 DIAGNOSIS — R519 Headache, unspecified: Secondary | ICD-10-CM | POA: Diagnosis not present

## 2022-03-21 DIAGNOSIS — E1129 Type 2 diabetes mellitus with other diabetic kidney complication: Secondary | ICD-10-CM | POA: Diagnosis not present

## 2022-03-21 DIAGNOSIS — T8249XA Other complication of vascular dialysis catheter, initial encounter: Secondary | ICD-10-CM | POA: Diagnosis not present

## 2022-03-21 DIAGNOSIS — N2581 Secondary hyperparathyroidism of renal origin: Secondary | ICD-10-CM | POA: Diagnosis not present

## 2022-03-21 DIAGNOSIS — D631 Anemia in chronic kidney disease: Secondary | ICD-10-CM | POA: Diagnosis not present

## 2022-03-21 DIAGNOSIS — D509 Iron deficiency anemia, unspecified: Secondary | ICD-10-CM | POA: Diagnosis not present

## 2022-03-21 NOTE — Progress Notes (Signed)
? ? ?Postoperative Access Visit ? ? ?History of Present Illness  ? ?Katie Garcia is a 81 y.o. year old female who presents for postoperative follow-up for: Left arm brachial artery to basilic vein AV fistula on 02/03/22 by Dr. Donzetta Matters.  The patient's wounds are well healed.  The patient notes no steal symptoms.  The patient is  able to complete their activities of daily living.  The patient's current symptoms are: numbness along medial cutaneous nerve distribution. ? ?She is currently dialyzing via a left IJ TDC on Tues/ Thurs/Sat at the [facility] Location ? ?Physical Examination  ? ?Vitals:  ? 03/24/22 0925  ?BP: (!) 104/59  ?Pulse: 88  ?Resp: 14  ?Temp: (!) 97.1 ?F (36.2 ?C)  ?TempSrc: Temporal  ?SpO2: 92%  ?Weight: 189 lb (85.7 kg)  ?Height: 5' (1.524 m)  ? ?Body mass index is 36.91 kg/m?. ? ?left arm Incision is well healed, 2+ radial pulse, hand grip is 5/5, sensation in digits is intact, palpable thrill, bruit can be auscultated   ? ?Non invasive vascular lab: 03/24/22 ?Findings:  ?+--------------------+----------+-----------------+--------+  ?AVF                 PSV (cm/s)Flow Vol (mL/min)Comments  ?+--------------------+----------+-----------------+--------+  ?Native artery inflow    90           174                 ?+--------------------+----------+-----------------+--------+  ?AVF Anastomosis        334                               ?+--------------------+----------+-----------------+--------+  ? ?   ?+------------+----------+-------------+----------+--------+  ?OUTFLOW VEINPSV (cm/s)Diameter (cm)Depth (cm)Describe  ?+------------+----------+-------------+----------+--------+  ?Prox UA         95        0.60        1.83             ?+------------+----------+-------------+----------+--------+  ?Mid UA         143        0.57        2.01             ?+------------+----------+-------------+----------+--------+  ?Dist UA        499        0.48        1.99              ?+------------+----------+-------------+----------+--------+  ?AC Fossa       557        0.26        1.76             ?+------------+----------+-------------+----------+--------+  ? ?Summary: Patent arteriovenous fistula with mildly resistive arterial inflow.  ? ? ?Medical Decision Making  ? ?Katie Garcia is a 81 y.o. year old female who presents s/p Left arm brachial artery to basilic vein AV fistula by Dr. Donzetta Matters on 02/03/22. Left arm incisions have healed nicely. Patent is without signs or symptoms of steal syndrome ?Duplex shows patent left AV fistula however fistula is not fully matured, low flow volume and also very deep in left upper arm ?Recommend Fistulogram to evaluate if fistula is salvageable and may need Angioplasty to improve maturation/ volume flow ?Will subsequently need Left Second stage transposition if the fistula is salvageable, which can be arranged after Fistulogram ?She dialyzes on Tues/Thurs/ Sat so will try to arrange fistulogram in near future on  non dialysis day ? ?Karoline Caldwell, PA-C ?[facility] ?Office: [phone] ? ?Clinic MD: Virl Cagey ?

## 2022-03-22 ENCOUNTER — Other Ambulatory Visit: Payer: Self-pay

## 2022-03-22 ENCOUNTER — Ambulatory Visit: Payer: Medicare Other | Admitting: Podiatry

## 2022-03-22 ENCOUNTER — Encounter: Payer: Self-pay | Admitting: Podiatry

## 2022-03-22 DIAGNOSIS — M79675 Pain in left toe(s): Secondary | ICD-10-CM | POA: Diagnosis not present

## 2022-03-22 DIAGNOSIS — Z794 Long term (current) use of insulin: Secondary | ICD-10-CM | POA: Diagnosis not present

## 2022-03-22 DIAGNOSIS — M79674 Pain in right toe(s): Secondary | ICD-10-CM | POA: Diagnosis not present

## 2022-03-22 DIAGNOSIS — E0822 Diabetes mellitus due to underlying condition with diabetic chronic kidney disease: Secondary | ICD-10-CM | POA: Diagnosis not present

## 2022-03-22 DIAGNOSIS — Z992 Dependence on renal dialysis: Secondary | ICD-10-CM

## 2022-03-22 DIAGNOSIS — N186 End stage renal disease: Secondary | ICD-10-CM | POA: Diagnosis not present

## 2022-03-22 DIAGNOSIS — B351 Tinea unguium: Secondary | ICD-10-CM | POA: Diagnosis not present

## 2022-03-22 DIAGNOSIS — L84 Corns and callosities: Secondary | ICD-10-CM | POA: Diagnosis not present

## 2022-03-23 DIAGNOSIS — D688 Other specified coagulation defects: Secondary | ICD-10-CM | POA: Diagnosis not present

## 2022-03-23 DIAGNOSIS — N2581 Secondary hyperparathyroidism of renal origin: Secondary | ICD-10-CM | POA: Diagnosis not present

## 2022-03-23 DIAGNOSIS — Z992 Dependence on renal dialysis: Secondary | ICD-10-CM | POA: Diagnosis not present

## 2022-03-23 DIAGNOSIS — T8249XA Other complication of vascular dialysis catheter, initial encounter: Secondary | ICD-10-CM | POA: Diagnosis not present

## 2022-03-23 DIAGNOSIS — N186 End stage renal disease: Secondary | ICD-10-CM | POA: Diagnosis not present

## 2022-03-24 ENCOUNTER — Ambulatory Visit (INDEPENDENT_AMBULATORY_CARE_PROVIDER_SITE_OTHER): Payer: Medicare Other | Admitting: Physician Assistant

## 2022-03-24 ENCOUNTER — Ambulatory Visit (HOSPITAL_COMMUNITY)
Admission: RE | Admit: 2022-03-24 | Discharge: 2022-03-24 | Disposition: A | Discharge status: Home / Self Care | Payer: Medicare Other | Source: Ambulatory Visit | Attending: Physician Assistant | Admitting: Physician Assistant

## 2022-03-24 VITALS — BP 104/59 | HR 88 | Temp 97.1°F | Resp 14 | Ht 60.0 in | Wt 189.0 lb

## 2022-03-24 DIAGNOSIS — N186 End stage renal disease: Secondary | ICD-10-CM

## 2022-03-24 DIAGNOSIS — Z992 Dependence on renal dialysis: Secondary | ICD-10-CM | POA: Diagnosis not present

## 2022-03-24 DIAGNOSIS — E1122 Type 2 diabetes mellitus with diabetic chronic kidney disease: Secondary | ICD-10-CM | POA: Diagnosis not present

## 2022-03-25 DIAGNOSIS — R519 Headache, unspecified: Secondary | ICD-10-CM | POA: Diagnosis not present

## 2022-03-25 DIAGNOSIS — T8249XA Other complication of vascular dialysis catheter, initial encounter: Secondary | ICD-10-CM | POA: Diagnosis not present

## 2022-03-25 DIAGNOSIS — N2581 Secondary hyperparathyroidism of renal origin: Secondary | ICD-10-CM | POA: Diagnosis not present

## 2022-03-25 DIAGNOSIS — D688 Other specified coagulation defects: Secondary | ICD-10-CM | POA: Diagnosis not present

## 2022-03-25 DIAGNOSIS — N186 End stage renal disease: Secondary | ICD-10-CM | POA: Diagnosis not present

## 2022-03-25 DIAGNOSIS — E1129 Type 2 diabetes mellitus with other diabetic kidney complication: Secondary | ICD-10-CM | POA: Diagnosis not present

## 2022-03-25 DIAGNOSIS — Z992 Dependence on renal dialysis: Secondary | ICD-10-CM | POA: Diagnosis not present

## 2022-03-25 DIAGNOSIS — D631 Anemia in chronic kidney disease: Secondary | ICD-10-CM | POA: Diagnosis not present

## 2022-03-25 NOTE — Progress Notes (Signed)
   Complete physical exam  Patient: Katie Garcia   DOB: 10/14/1997   81 y.o. Female  MRN: [id number]  Subjective:    No chief complaint on file.   Katie Garcia is a 81 y.o. female who presents today for a complete physical exam. She reports consuming a {diet types:17450} diet. {types:19826} She generally feels {DESC; WELL/FAIRLY WELL/POORLY:18703}. She reports sleeping {DESC; WELL/FAIRLY WELL/POORLY:18703}. She {does/does not:200015} have additional problems to discuss today.    Most recent fall risk assessment:    06/21/2022   10:42 AM  Fall Risk   Falls in the past year? 0  Number falls in past yr: 0  Injury with Fall? 0  Risk for fall due to : No Fall Risks  Follow up Falls evaluation completed     Most recent depression screenings:    06/21/2022   10:42 AM 05/12/2021   10:46 AM  PHQ 2/9 Scores  PHQ - 2 Score 0 0  PHQ- 9 Score 5     {VISON DENTAL STD PSA (Optional):27386}  {History (Optional):23778}  Patient Care Team: Jessup, Joy, NP as PCP - General (Nurse Practitioner)   Outpatient Medications Prior to Visit  Medication Sig   fluticasone (FLONASE) 50 MCG/ACT nasal spray Place 2 sprays into both nostrils in the morning and at bedtime. After 7 days, reduce to once daily.   norgestimate-ethinyl estradiol (SPRINTEC 28) 0.25-35 MG-MCG tablet Take 1 tablet by mouth daily.   Nystatin POWD Apply liberally to affected area 2 times per day   spironolactone (ALDACTONE) 100 MG tablet Take 1 tablet (100 mg total) by mouth daily.   No facility-administered medications prior to visit.    ROS        Objective:     There were no vitals taken for this visit. {Vitals History (Optional):23777}  Physical Exam   No results found for any visits on 07/27/22. {Show previous labs (optional):23779}    Assessment & Plan:    Routine Health Maintenance and Physical Exam  Immunization History  Administered Date(s) Administered   DTaP 12/28/1999, 02/23/2000,  05/03/2000, 01/17/2001, 08/02/2004   Hepatitis A 05/29/2008, 06/04/2009   Hepatitis B 10/15/1999, 11/22/1999, 05/03/2000   HiB (PRP-OMP) 12/28/1999, 02/23/2000, 05/03/2000, 01/17/2001   IPV 12/28/1999, 02/23/2000, 10/22/2000, 08/02/2004   Influenza,inj,Quad PF,6+ Mos 09/04/2014   Influenza-Unspecified 12/04/2012   MMR 10/22/2001, 08/02/2004   Meningococcal Polysaccharide 06/03/2012   Pneumococcal Conjugate-13 01/17/2001   Pneumococcal-Unspecified 05/03/2000, 07/17/2000   Tdap 06/03/2012   Varicella 10/22/2000, 05/29/2008    Health Maintenance  Topic Date Due   HIV Screening  Never done   Hepatitis C Screening  Never done   INFLUENZA VACCINE  07/25/2022   PAP-Cervical Cytology Screening  07/27/2022 (Originally 10/13/2020)   PAP SMEAR-Modifier  07/27/2022 (Originally 10/13/2020)   TETANUS/TDAP  07/27/2022 (Originally 06/03/2022)   HPV VACCINES  Discontinued   COVID-19 Vaccine  Discontinued    Discussed health benefits of physical activity, and encouraged her to engage in regular exercise appropriate for her age and condition.  Problem List Items Addressed This Visit   None Visit Diagnoses     Annual physical exam    -  Primary   Cervical cancer screening       Need for Tdap vaccination          No follow-ups on file.     Joy Jessup, NP   

## 2022-03-26 NOTE — Progress Notes (Signed)
?  Subjective:  ?Patient ID: Katie Garcia, female    DOB: 06-24-1941,  MRN: [id number] ? ?Katie Garcia presents to clinic today for at risk foot care with h/o NIDDM with ESRD on hemodialysis and callus(es) b/l lower extremities and painful thick toenails that are difficult to trim. Painful toenails interfere with ambulation. Aggravating factors include wearing enclosed shoe gear. Pain is relieved with periodic professional debridement. Painful calluses are aggravated when weightbearing with and without shoegear. Pain is relieved with periodic professional debridement. ? ?[name], she was admitted to the hospital regarding dialysis access. ? ?Patient states blood glucose was 220 mg/dl today.  Last HgA1c was 8.9%. ? ?New problem(s): None.  ? ?PCP is [name], MD , and last visit was January 10, 2022. ? ?Allergies  ?Allergen Reactions  ? Penicillins Rash  ?  Did it involve swelling of the face/tongue/throat, SOB, or low BP? No ?Did it involve sudden or severe rash/hives, skin peeling, or any reaction on the inside of your mouth or nose? Yes ?Did you need to seek medical attention at a hospital or doctor's office? N/A ?When did it last happen? N/A      ?If all above answers are "NO", may proceed with cephalosporin use.  ? ? ?Review of Systems: Negative except as noted in the HPI. ? ?Objective: No changes noted in today's physical examination. ?Constitutional Katie Garcia is a pleasant 81 y.o. African American female, obese in NAD. AAO x 3.   ?Vascular CFT <3 seconds b/l LE. Palpable DP pulse(s) b/l LE. Faintly palpable PT pulse(s) b/l LE. Pedal hair absent. No pain with calf compression b/l. Trace edema noted BLE.  ?Neurologic Normal speech. Oriented to person, place, and time. Protective sensation intact 5/5 intact bilaterally with 10g monofilament b/l. Vibratory sensation intact b/l.  ?Dermatologic Pedal integument with normal turgor, texture and tone b/l LE. No open wounds b/l. No  interdigital macerations b/l. Toenails 1-5 b/l elongated, thickened, discolored with subungual debris. +Tenderness with dorsal palpation of nailplates. Hyperkeratotic lesion(s) noted submet head 5 b/l.  ?Orthopedic: Normal muscle strength 5/5 to all lower extremity muscle groups bilaterally. Pes planus deformity noted bilateral LE.Marland Kitchen No pain, crepitus or joint limitation noted with ROM b/l LE.  Patient ambulates independently without assistive aids.  ? ?Radiographs: None ? ?  Latest Ref Rng & Units 03/09/2022  ? 10:30 AM 12/09/2021  ? 10:13 AM 07/08/2021  ? 11:05 AM 05/04/2021  ?  9:12 AM  ?Hemoglobin A1C  ?Hemoglobin-A1c 4.0 - 5.6 % 8.9   7.7   8.3   9.1    ? ?Assessment/Plan: ?1. Pain due to onychomycosis of toenails of both feet   ?2. Callus   ?3. Diabetes mellitus due to underlying condition with chronic kidney disease on chronic dialysis, with long-term current use of insulin ([place])   ?-Examined patient. ?-Continue diabetic foot care principles: inspect feet daily, monitor glucose as recommended by PCP and/or Endocrinologist, and follow prescribed diet per PCP, Endocrinologist and/or dietician. ?-Toenails 1-5 b/l were debrided in length and girth with sterile nail nippers and dremel without iatrogenic bleeding.  ?-Callus(es) submet head 5 b/l pared utilizing sterile scalpel blade without complication or incident. Total number debrided =2. ?-Patient/POA to call should there be question/concern in the interim.  ? ?Return in about 3 months (around 06/22/2022). ? ?Marzetta Board, DPM  ?

## 2022-03-28 ENCOUNTER — Other Ambulatory Visit: Payer: Self-pay

## 2022-03-28 DIAGNOSIS — E1129 Type 2 diabetes mellitus with other diabetic kidney complication: Secondary | ICD-10-CM | POA: Diagnosis not present

## 2022-03-28 DIAGNOSIS — D688 Other specified coagulation defects: Secondary | ICD-10-CM | POA: Diagnosis not present

## 2022-03-28 DIAGNOSIS — D631 Anemia in chronic kidney disease: Secondary | ICD-10-CM | POA: Diagnosis not present

## 2022-03-28 DIAGNOSIS — R519 Headache, unspecified: Secondary | ICD-10-CM | POA: Diagnosis not present

## 2022-03-28 DIAGNOSIS — Z992 Dependence on renal dialysis: Secondary | ICD-10-CM | POA: Diagnosis not present

## 2022-03-28 DIAGNOSIS — N2581 Secondary hyperparathyroidism of renal origin: Secondary | ICD-10-CM | POA: Diagnosis not present

## 2022-03-28 DIAGNOSIS — N186 End stage renal disease: Secondary | ICD-10-CM | POA: Diagnosis not present

## 2022-03-28 DIAGNOSIS — T8249XA Other complication of vascular dialysis catheter, initial encounter: Secondary | ICD-10-CM | POA: Diagnosis not present

## 2022-03-30 DIAGNOSIS — N186 End stage renal disease: Secondary | ICD-10-CM | POA: Diagnosis not present

## 2022-03-30 DIAGNOSIS — Z992 Dependence on renal dialysis: Secondary | ICD-10-CM | POA: Diagnosis not present

## 2022-03-30 DIAGNOSIS — T8249XA Other complication of vascular dialysis catheter, initial encounter: Secondary | ICD-10-CM | POA: Diagnosis not present

## 2022-03-30 DIAGNOSIS — N2581 Secondary hyperparathyroidism of renal origin: Secondary | ICD-10-CM | POA: Diagnosis not present

## 2022-03-30 DIAGNOSIS — D688 Other specified coagulation defects: Secondary | ICD-10-CM | POA: Diagnosis not present

## 2022-03-30 DIAGNOSIS — D631 Anemia in chronic kidney disease: Secondary | ICD-10-CM | POA: Diagnosis not present

## 2022-03-30 DIAGNOSIS — R519 Headache, unspecified: Secondary | ICD-10-CM | POA: Diagnosis not present

## 2022-03-30 DIAGNOSIS — E1129 Type 2 diabetes mellitus with other diabetic kidney complication: Secondary | ICD-10-CM | POA: Diagnosis not present

## 2022-04-01 DIAGNOSIS — N186 End stage renal disease: Secondary | ICD-10-CM | POA: Diagnosis not present

## 2022-04-01 DIAGNOSIS — Z992 Dependence on renal dialysis: Secondary | ICD-10-CM | POA: Diagnosis not present

## 2022-04-01 DIAGNOSIS — E1129 Type 2 diabetes mellitus with other diabetic kidney complication: Secondary | ICD-10-CM | POA: Diagnosis not present

## 2022-04-01 DIAGNOSIS — D631 Anemia in chronic kidney disease: Secondary | ICD-10-CM | POA: Diagnosis not present

## 2022-04-01 DIAGNOSIS — T8249XA Other complication of vascular dialysis catheter, initial encounter: Secondary | ICD-10-CM | POA: Diagnosis not present

## 2022-04-01 DIAGNOSIS — R519 Headache, unspecified: Secondary | ICD-10-CM | POA: Diagnosis not present

## 2022-04-01 DIAGNOSIS — N2581 Secondary hyperparathyroidism of renal origin: Secondary | ICD-10-CM | POA: Diagnosis not present

## 2022-04-01 DIAGNOSIS — D688 Other specified coagulation defects: Secondary | ICD-10-CM | POA: Diagnosis not present

## 2022-04-04 DIAGNOSIS — D631 Anemia in chronic kidney disease: Secondary | ICD-10-CM | POA: Diagnosis not present

## 2022-04-04 DIAGNOSIS — R519 Headache, unspecified: Secondary | ICD-10-CM | POA: Diagnosis not present

## 2022-04-04 DIAGNOSIS — N186 End stage renal disease: Secondary | ICD-10-CM | POA: Diagnosis not present

## 2022-04-04 DIAGNOSIS — D688 Other specified coagulation defects: Secondary | ICD-10-CM | POA: Diagnosis not present

## 2022-04-04 DIAGNOSIS — Z992 Dependence on renal dialysis: Secondary | ICD-10-CM | POA: Diagnosis not present

## 2022-04-04 DIAGNOSIS — T8249XA Other complication of vascular dialysis catheter, initial encounter: Secondary | ICD-10-CM | POA: Diagnosis not present

## 2022-04-04 DIAGNOSIS — E1129 Type 2 diabetes mellitus with other diabetic kidney complication: Secondary | ICD-10-CM | POA: Diagnosis not present

## 2022-04-04 DIAGNOSIS — N2581 Secondary hyperparathyroidism of renal origin: Secondary | ICD-10-CM | POA: Diagnosis not present

## 2022-04-05 ENCOUNTER — Ambulatory Visit (HOSPITAL_COMMUNITY)
Admission: RE | Disposition: A | Discharge status: Home / Self Care | Payer: Self-pay | Source: Home / Self Care | Attending: Vascular Surgery

## 2022-04-05 ENCOUNTER — Ambulatory Visit (HOSPITAL_COMMUNITY)
Admission: RE | Admit: 2022-04-05 | Discharge: 2022-04-05 | Disposition: A | Discharge status: Home / Self Care | Payer: Medicare Other | Attending: Vascular Surgery | Admitting: Vascular Surgery

## 2022-04-05 DIAGNOSIS — T82898A Other specified complication of vascular prosthetic devices, implants and grafts, initial encounter: Secondary | ICD-10-CM | POA: Insufficient documentation

## 2022-04-05 DIAGNOSIS — Y841 Kidney dialysis as the cause of abnormal reaction of the patient, or of later complication, without mention of misadventure at the time of the procedure: Secondary | ICD-10-CM | POA: Insufficient documentation

## 2022-04-05 DIAGNOSIS — N186 End stage renal disease: Secondary | ICD-10-CM | POA: Diagnosis not present

## 2022-04-05 DIAGNOSIS — Z992 Dependence on renal dialysis: Secondary | ICD-10-CM | POA: Diagnosis not present

## 2022-04-05 HISTORY — PX: A/V FISTULAGRAM: CATH118298

## 2022-04-05 LAB — POCT I-STAT, CHEM 8
BUN: 36 mg/dL — ABNORMAL HIGH (ref 8–23)
Calcium, Ion: 1.19 mmol/L (ref 1.15–1.40)
Chloride: 97 mmol/L — ABNORMAL LOW (ref 98–111)
Creatinine, Ser: 7.1 mg/dL — ABNORMAL HIGH (ref 0.44–1.00)
Glucose, Bld: 193 mg/dL — ABNORMAL HIGH (ref 70–99)
HCT: 40 % (ref 36.0–46.0)
Hemoglobin: 13.6 g/dL (ref 12.0–15.0)
Potassium: 3.8 mmol/L (ref 3.5–5.1)
Sodium: 139 mmol/L (ref 135–145)
TCO2: 31 mmol/L (ref 22–32)

## 2022-04-05 LAB — GLUCOSE, CAPILLARY
Glucose-Capillary: 189 mg/dL — ABNORMAL HIGH (ref 70–99)
Glucose-Capillary: 178 mg/dL — ABNORMAL HIGH (ref 70–99)

## 2022-04-05 SURGERY — A/V FISTULAGRAM
Anesthesia: LOCAL | Laterality: Left

## 2022-04-05 MED ORDER — IODIXANOL 320 MG/ML IV SOLN
INTRAVENOUS | Status: DC | PRN
  Administered 2022-04-05: 30 mL

## 2022-04-05 MED ORDER — SODIUM CHLORIDE 0.9% FLUSH: 3.0000 mL | Freq: Two times a day (BID) | INTRAVENOUS | Status: DC

## 2022-04-05 MED ORDER — HYDRALAZINE HCL 20 MG/ML IJ SOLN
INTRAMUSCULAR | Status: AC
  Filled 2022-04-05: qty 1

## 2022-04-05 MED ORDER — HYDRALAZINE HCL 20 MG/ML IJ SOLN
INTRAMUSCULAR | Status: DC | PRN
  Administered 2022-04-05: 10 mg via INTRAVENOUS

## 2022-04-05 MED ORDER — HEPARIN (PORCINE) IN NACL 1000-0.9 UT/500ML-% IV SOLN
INTRAVENOUS | Status: AC
  Filled 2022-04-05: qty 500

## 2022-04-05 MED ORDER — SODIUM CHLORIDE 0.9 % IV SOLN: 250.0000 mL | INTRAVENOUS | Status: DC | PRN

## 2022-04-05 MED ORDER — LIDOCAINE HCL (PF) 1 % IJ SOLN
INTRAMUSCULAR | Status: AC
  Filled 2022-04-05: qty 30

## 2022-04-05 MED ORDER — SODIUM CHLORIDE 0.9% FLUSH: 3.0000 mL | INTRAVENOUS | Status: DC | PRN

## 2022-04-05 MED ORDER — HEPARIN (PORCINE) IN NACL 1000-0.9 UT/500ML-% IV SOLN
INTRAVENOUS | Status: DC | PRN
  Administered 2022-04-05: 500 mL

## 2022-04-05 SURGICAL SUPPLY — 9 items
BAG SNAP BAND KOVER 36X36 (MISCELLANEOUS) ×2 IMPLANT
COVER DOME SNAP 22 D (MISCELLANEOUS) ×2 IMPLANT
KIT MICROPUNCTURE NIT STIFF (SHEATH) ×1 IMPLANT
PROTECTION STATION PRESSURIZED (MISCELLANEOUS) ×2
SHEATH PROBE COVER 6X72 (BAG) ×3 IMPLANT
STATION PROTECTION PRESSURIZED (MISCELLANEOUS) ×1 IMPLANT
STOPCOCK MORSE 400PSI 3WAY (MISCELLANEOUS) ×2 IMPLANT
TRAY PV CATH (CUSTOM PROCEDURE TRAY) ×2 IMPLANT
TUBING CIL FLEX 10 FLL-RA (TUBING) ×2 IMPLANT

## 2022-04-05 NOTE — Op Note (Signed)
? ? ?  Patient name: Katie Garcia MRN: [id number] DOB: 22-Jan-1941 Sex: female ? ?04/05/2022 ?Pre-operative Diagnosis: Fistula malfunction ?Post-operative diagnosis:  Same ?Surgeon:  Broadus John, MD ?Procedure Performed: ?1.  Ultrasound-guided micropuncture access of the left brachiobasilic fistula ?2.  Fistulogram ? ?Indications: Patient is a 81 year old female with history of multiple permanent HD access attempts.  She most recently underwent left-sided brachiobasilic fistula creation 2 months ago.  The second stage transposition has not been completed as the fistula has failed to mature.  She presents today for fistulogram for possible balloon assisted fistula maturation.  After discussing the risk and benefits of left arm fistulogram in an effort to improve flow, [name] elected to proceed. ? ?Findings:  ?Arterial anastomosis could not be evaluated ?Sluggish, venous outflow.  The fistula appeared to be occluded, with the basilic vein filling through collaterals. ?  ?Procedure:   ?Patient was brought to the Cath Lab and laid in supine position.  She was prepped and draped in standard fashion a timeout was performed.  The left-sided brachiobasilic fistula was accessed using an ultrasound-guided micropuncture needle.  There was sluggish, venous bleeding at the time of access.  Fistulogram followed.  Prior to completion of the fistulogram the micropuncture sheath pulled out of the vessel resulting in contrast extravasation into the arm.  Pressure was held followed by reaccess for fistulogram completion.  See results above. ? ?Impression: The fistula is not salvageable at this point, it will not mature.  I discussed this with [name] and we will set her up for follow-up for new fistula creation. ? ? ?J. Melene Muller, MD ?[facility] ?Office: [phone] ? ? ? ?

## 2022-04-05 NOTE — H&P (Signed)
? ? ?Postoperative Access Visit ? ?Patient seen and examined in preop holding.  No complaints. ?No changes to medication history or physical exam since last seen in clinic. ?After discussing the risks and benefits of left arm fistulagram for possible assisted maturation, Amiyrah Lamere [place] elected to proceed.  ? ?Broadus John MD ? ?History of Present Illness  ? ?Katie Garcia is a 81 y.o. year old female who presents for postoperative follow-up for: Left arm brachial artery to basilic vein AV fistula on 02/03/22 by Dr. Donzetta Matters.  The patient's wounds are well healed.  The patient notes no steal symptoms.  The patient is  able to complete their activities of daily living.  The patient's current symptoms are: numbness along medial cutaneous nerve distribution. ? ?She is currently dialyzing via a left IJ TDC on Tues/ Thurs/Sat at the [facility] Location ? ?Physical Examination  ? ?Vitals:  ? 04/05/22 0847  ?BP: (!) 146/69  ?Pulse: 81  ?Resp: 14  ?Temp: 98.3 ?F (36.8 ?C)  ?TempSrc: Oral  ?SpO2: 100%  ?Weight: 88.5 kg  ?Height: 5' (1.524 m)  ? ?Body mass index is 38.08 kg/m?. ? ?left arm Incision is well healed, 2+ radial pulse, hand grip is 5/5, sensation in digits is intact, palpable thrill, bruit can be auscultated   ? ?Non invasive vascular lab: 03/24/22 ?Findings:  ?+--------------------+----------+-----------------+--------+  ?AVF                 PSV (cm/s)Flow Vol (mL/min)Comments  ?+--------------------+----------+-----------------+--------+  ?Native artery inflow    90           174                 ?+--------------------+----------+-----------------+--------+  ?AVF Anastomosis        334                               ?+--------------------+----------+-----------------+--------+  ? ?   ?+------------+----------+-------------+----------+--------+  ?OUTFLOW VEINPSV (cm/s)Diameter (cm)Depth (cm)Describe  ?+------------+----------+-------------+----------+--------+  ?Prox UA         95        0.60         1.83             ?+------------+----------+-------------+----------+--------+  ?Mid UA         143        0.57        2.01             ?+------------+----------+-------------+----------+--------+  ?Dist UA        499        0.48        1.99             ?+------------+----------+-------------+----------+--------+  ?AC Fossa       557        0.26        1.76             ?+------------+----------+-------------+----------+--------+  ? ?Summary: Patent arteriovenous fistula with mildly resistive arterial inflow.  ? ? ?Medical Decision Making  ? ?Selen Smucker Tait is a 81 y.o. year old female who presents s/p Left arm brachial artery to basilic vein AV fistula by Dr. Donzetta Matters on 02/03/22. Left arm incisions have healed nicely. Patent is without signs or symptoms of steal syndrome ?Duplex shows patent left AV fistula however fistula is not fully matured, low flow volume and also very deep in left upper arm ?Recommend Fistulogram to evaluate  if fistula is salvageable and may need Angioplasty to improve maturation/ volume flow ?Will subsequently need Left Second stage transposition if the fistula is salvageable, which can be arranged after Fistulogram ?She dialyzes on Tues/Thurs/ Sat so will try to arrange fistulogram in near future on non dialysis day ? ?Broadus John, PA-C ?[facility] ?Office: [phone] ? ?Clinic MD: Virl Cagey ?

## 2022-04-06 ENCOUNTER — Encounter (HOSPITAL_COMMUNITY): Payer: Self-pay | Admitting: Vascular Surgery

## 2022-04-06 DIAGNOSIS — R519 Headache, unspecified: Secondary | ICD-10-CM | POA: Diagnosis not present

## 2022-04-06 DIAGNOSIS — D688 Other specified coagulation defects: Secondary | ICD-10-CM | POA: Diagnosis not present

## 2022-04-06 DIAGNOSIS — D631 Anemia in chronic kidney disease: Secondary | ICD-10-CM | POA: Diagnosis not present

## 2022-04-06 DIAGNOSIS — T8249XA Other complication of vascular dialysis catheter, initial encounter: Secondary | ICD-10-CM | POA: Diagnosis not present

## 2022-04-06 DIAGNOSIS — Z992 Dependence on renal dialysis: Secondary | ICD-10-CM | POA: Diagnosis not present

## 2022-04-06 DIAGNOSIS — E1129 Type 2 diabetes mellitus with other diabetic kidney complication: Secondary | ICD-10-CM | POA: Diagnosis not present

## 2022-04-06 DIAGNOSIS — N186 End stage renal disease: Secondary | ICD-10-CM | POA: Diagnosis not present

## 2022-04-06 DIAGNOSIS — N2581 Secondary hyperparathyroidism of renal origin: Secondary | ICD-10-CM | POA: Diagnosis not present

## 2022-04-06 MED FILL — Lidocaine HCl Local Preservative Free (PF) Inj 1%: INTRAMUSCULAR | Qty: 30 | Status: AC

## 2022-04-08 DIAGNOSIS — D688 Other specified coagulation defects: Secondary | ICD-10-CM | POA: Diagnosis not present

## 2022-04-08 DIAGNOSIS — T8249XA Other complication of vascular dialysis catheter, initial encounter: Secondary | ICD-10-CM | POA: Diagnosis not present

## 2022-04-08 DIAGNOSIS — E1129 Type 2 diabetes mellitus with other diabetic kidney complication: Secondary | ICD-10-CM | POA: Diagnosis not present

## 2022-04-08 DIAGNOSIS — R519 Headache, unspecified: Secondary | ICD-10-CM | POA: Diagnosis not present

## 2022-04-08 DIAGNOSIS — N186 End stage renal disease: Secondary | ICD-10-CM | POA: Diagnosis not present

## 2022-04-08 DIAGNOSIS — N2581 Secondary hyperparathyroidism of renal origin: Secondary | ICD-10-CM | POA: Diagnosis not present

## 2022-04-08 DIAGNOSIS — Z992 Dependence on renal dialysis: Secondary | ICD-10-CM | POA: Diagnosis not present

## 2022-04-08 DIAGNOSIS — D631 Anemia in chronic kidney disease: Secondary | ICD-10-CM | POA: Diagnosis not present

## 2022-04-11 DIAGNOSIS — E1129 Type 2 diabetes mellitus with other diabetic kidney complication: Secondary | ICD-10-CM | POA: Diagnosis not present

## 2022-04-11 DIAGNOSIS — N186 End stage renal disease: Secondary | ICD-10-CM | POA: Diagnosis not present

## 2022-04-11 DIAGNOSIS — N2581 Secondary hyperparathyroidism of renal origin: Secondary | ICD-10-CM | POA: Diagnosis not present

## 2022-04-11 DIAGNOSIS — D688 Other specified coagulation defects: Secondary | ICD-10-CM | POA: Diagnosis not present

## 2022-04-11 DIAGNOSIS — R519 Headache, unspecified: Secondary | ICD-10-CM | POA: Diagnosis not present

## 2022-04-11 DIAGNOSIS — D631 Anemia in chronic kidney disease: Secondary | ICD-10-CM | POA: Diagnosis not present

## 2022-04-11 DIAGNOSIS — T8249XA Other complication of vascular dialysis catheter, initial encounter: Secondary | ICD-10-CM | POA: Diagnosis not present

## 2022-04-11 DIAGNOSIS — Z992 Dependence on renal dialysis: Secondary | ICD-10-CM | POA: Diagnosis not present

## 2022-04-13 DIAGNOSIS — R519 Headache, unspecified: Secondary | ICD-10-CM | POA: Diagnosis not present

## 2022-04-13 DIAGNOSIS — N2581 Secondary hyperparathyroidism of renal origin: Secondary | ICD-10-CM | POA: Diagnosis not present

## 2022-04-13 DIAGNOSIS — N186 End stage renal disease: Secondary | ICD-10-CM | POA: Diagnosis not present

## 2022-04-13 DIAGNOSIS — D688 Other specified coagulation defects: Secondary | ICD-10-CM | POA: Diagnosis not present

## 2022-04-13 DIAGNOSIS — D631 Anemia in chronic kidney disease: Secondary | ICD-10-CM | POA: Diagnosis not present

## 2022-04-13 DIAGNOSIS — E1129 Type 2 diabetes mellitus with other diabetic kidney complication: Secondary | ICD-10-CM | POA: Diagnosis not present

## 2022-04-13 DIAGNOSIS — T8249XA Other complication of vascular dialysis catheter, initial encounter: Secondary | ICD-10-CM | POA: Diagnosis not present

## 2022-04-13 DIAGNOSIS — Z992 Dependence on renal dialysis: Secondary | ICD-10-CM | POA: Diagnosis not present

## 2022-04-15 DIAGNOSIS — E1129 Type 2 diabetes mellitus with other diabetic kidney complication: Secondary | ICD-10-CM | POA: Diagnosis not present

## 2022-04-15 DIAGNOSIS — D688 Other specified coagulation defects: Secondary | ICD-10-CM | POA: Diagnosis not present

## 2022-04-15 DIAGNOSIS — R519 Headache, unspecified: Secondary | ICD-10-CM | POA: Diagnosis not present

## 2022-04-15 DIAGNOSIS — D631 Anemia in chronic kidney disease: Secondary | ICD-10-CM | POA: Diagnosis not present

## 2022-04-15 DIAGNOSIS — N186 End stage renal disease: Secondary | ICD-10-CM | POA: Diagnosis not present

## 2022-04-15 DIAGNOSIS — T8249XA Other complication of vascular dialysis catheter, initial encounter: Secondary | ICD-10-CM | POA: Diagnosis not present

## 2022-04-15 DIAGNOSIS — Z992 Dependence on renal dialysis: Secondary | ICD-10-CM | POA: Diagnosis not present

## 2022-04-15 DIAGNOSIS — N2581 Secondary hyperparathyroidism of renal origin: Secondary | ICD-10-CM | POA: Diagnosis not present

## 2022-04-18 DIAGNOSIS — D688 Other specified coagulation defects: Secondary | ICD-10-CM | POA: Diagnosis not present

## 2022-04-18 DIAGNOSIS — R519 Headache, unspecified: Secondary | ICD-10-CM | POA: Diagnosis not present

## 2022-04-18 DIAGNOSIS — E1129 Type 2 diabetes mellitus with other diabetic kidney complication: Secondary | ICD-10-CM | POA: Diagnosis not present

## 2022-04-18 DIAGNOSIS — D631 Anemia in chronic kidney disease: Secondary | ICD-10-CM | POA: Diagnosis not present

## 2022-04-18 DIAGNOSIS — N186 End stage renal disease: Secondary | ICD-10-CM | POA: Diagnosis not present

## 2022-04-18 DIAGNOSIS — Z992 Dependence on renal dialysis: Secondary | ICD-10-CM | POA: Diagnosis not present

## 2022-04-18 DIAGNOSIS — T8249XA Other complication of vascular dialysis catheter, initial encounter: Secondary | ICD-10-CM | POA: Diagnosis not present

## 2022-04-18 DIAGNOSIS — N2581 Secondary hyperparathyroidism of renal origin: Secondary | ICD-10-CM | POA: Diagnosis not present

## 2022-04-20 DIAGNOSIS — E1129 Type 2 diabetes mellitus with other diabetic kidney complication: Secondary | ICD-10-CM | POA: Diagnosis not present

## 2022-04-20 DIAGNOSIS — R519 Headache, unspecified: Secondary | ICD-10-CM | POA: Diagnosis not present

## 2022-04-20 DIAGNOSIS — T8249XA Other complication of vascular dialysis catheter, initial encounter: Secondary | ICD-10-CM | POA: Diagnosis not present

## 2022-04-20 DIAGNOSIS — N2581 Secondary hyperparathyroidism of renal origin: Secondary | ICD-10-CM | POA: Diagnosis not present

## 2022-04-20 DIAGNOSIS — N186 End stage renal disease: Secondary | ICD-10-CM | POA: Diagnosis not present

## 2022-04-20 DIAGNOSIS — Z992 Dependence on renal dialysis: Secondary | ICD-10-CM | POA: Diagnosis not present

## 2022-04-20 DIAGNOSIS — D631 Anemia in chronic kidney disease: Secondary | ICD-10-CM | POA: Diagnosis not present

## 2022-04-20 DIAGNOSIS — D688 Other specified coagulation defects: Secondary | ICD-10-CM | POA: Diagnosis not present

## 2022-04-22 DIAGNOSIS — D688 Other specified coagulation defects: Secondary | ICD-10-CM | POA: Diagnosis not present

## 2022-04-22 DIAGNOSIS — T8249XA Other complication of vascular dialysis catheter, initial encounter: Secondary | ICD-10-CM | POA: Diagnosis not present

## 2022-04-22 DIAGNOSIS — D631 Anemia in chronic kidney disease: Secondary | ICD-10-CM | POA: Diagnosis not present

## 2022-04-22 DIAGNOSIS — Z992 Dependence on renal dialysis: Secondary | ICD-10-CM | POA: Diagnosis not present

## 2022-04-22 DIAGNOSIS — N186 End stage renal disease: Secondary | ICD-10-CM | POA: Diagnosis not present

## 2022-04-22 DIAGNOSIS — R519 Headache, unspecified: Secondary | ICD-10-CM | POA: Diagnosis not present

## 2022-04-22 DIAGNOSIS — N2581 Secondary hyperparathyroidism of renal origin: Secondary | ICD-10-CM | POA: Diagnosis not present

## 2022-04-22 DIAGNOSIS — E1129 Type 2 diabetes mellitus with other diabetic kidney complication: Secondary | ICD-10-CM | POA: Diagnosis not present

## 2022-04-23 DIAGNOSIS — N186 End stage renal disease: Secondary | ICD-10-CM | POA: Diagnosis not present

## 2022-04-23 DIAGNOSIS — Z992 Dependence on renal dialysis: Secondary | ICD-10-CM | POA: Diagnosis not present

## 2022-04-23 DIAGNOSIS — E1122 Type 2 diabetes mellitus with diabetic chronic kidney disease: Secondary | ICD-10-CM | POA: Diagnosis not present

## 2022-04-25 DIAGNOSIS — D688 Other specified coagulation defects: Secondary | ICD-10-CM | POA: Diagnosis not present

## 2022-04-25 DIAGNOSIS — N2581 Secondary hyperparathyroidism of renal origin: Secondary | ICD-10-CM | POA: Diagnosis not present

## 2022-04-25 DIAGNOSIS — R519 Headache, unspecified: Secondary | ICD-10-CM | POA: Diagnosis not present

## 2022-04-25 DIAGNOSIS — Z992 Dependence on renal dialysis: Secondary | ICD-10-CM | POA: Diagnosis not present

## 2022-04-25 DIAGNOSIS — T8249XA Other complication of vascular dialysis catheter, initial encounter: Secondary | ICD-10-CM | POA: Diagnosis not present

## 2022-04-25 DIAGNOSIS — N186 End stage renal disease: Secondary | ICD-10-CM | POA: Diagnosis not present

## 2022-04-26 IMAGING — MR MR HEAD W/O CM
12 of 13 series · 43 of 48 positions shown · non-contrast
Comparison: None.

CLINICAL DATA: Vertigo with weakness

EXAM:
MRI HEAD WITHOUT CONTRAST
TECHNIQUE: Multiplanar, multiecho pulse sequences of the brain and surrounding
structures were obtained without intravenous contrast.

[Series 5: DWI · axial · 3.0mm · 0.88mm/px · z∈[-123,+15]mm · 7 of 96 slices shown (1 of 4)]
[im 1/96]
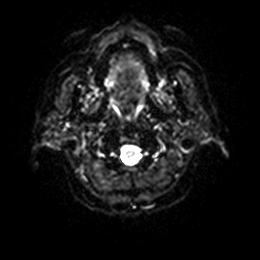
[im 16/96]
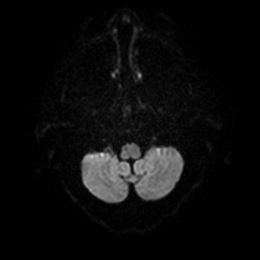
[im 32/96]
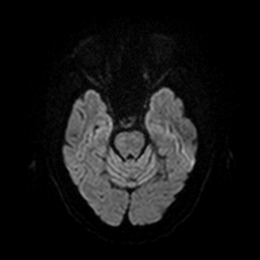
[im 48/96]
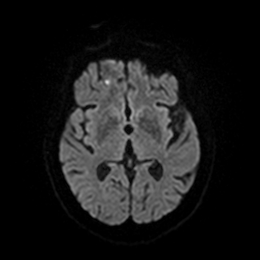
[im 64/96]
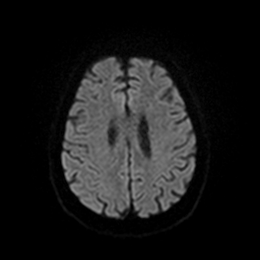
[im 80/96]
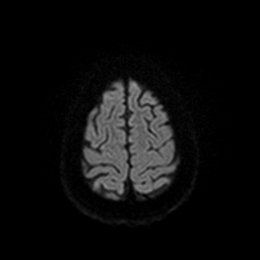
[im 96/96]
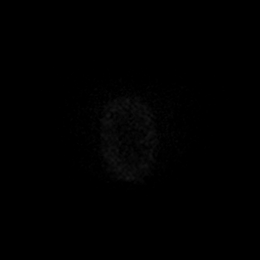

[Series 6: DWI · axial · 3.0mm · 0.88mm/px · z∈[-123,+15]mm · 4 of 48 slices shown (2 of 4)]
[im 1/48]
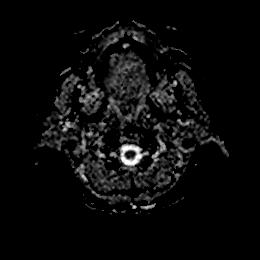
[im 16/48]
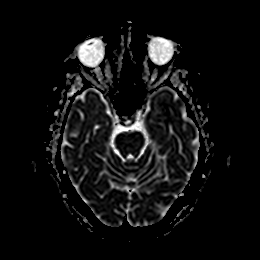
[im 32/48]
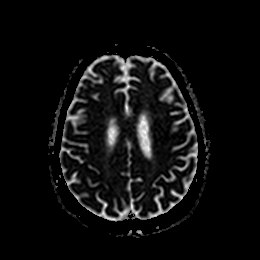
[im 48/48]
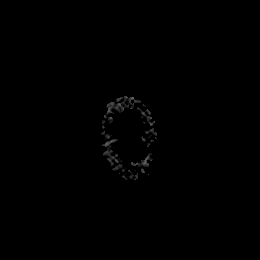

[Series 7: DWI · coronal · 4.0mm · 0.88mm/px · 5 of 64 slices shown (3 of 4)]
[im 1/64]
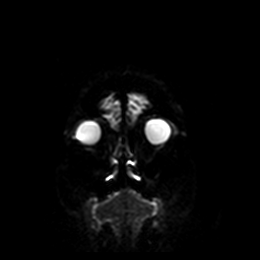
[im 16/64]
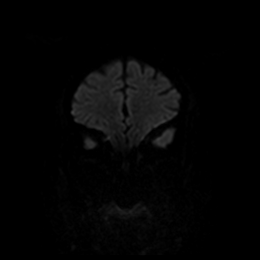
[im 32/64]
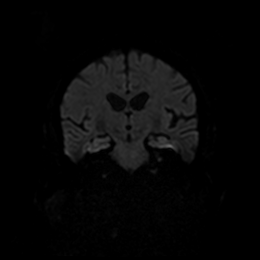
[im 48/64]
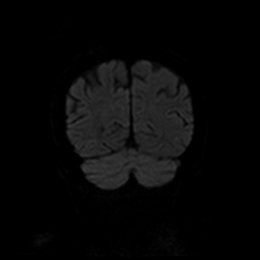
[im 64/64]
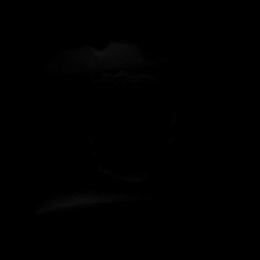

[Series 8: DWI · coronal · 4.0mm · 0.88mm/px · 3 of 32 slices shown (4 of 4)]
[im 1/32]
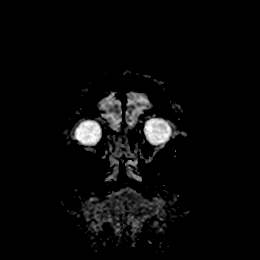
[im 16/32]
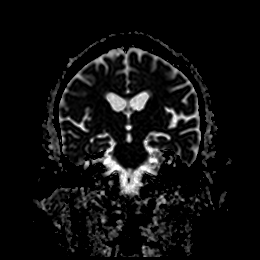
[im 32/32]
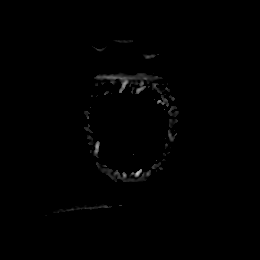

[Series 9: T1 · sagittal · 5.0mm · 0.75mm/px · 2 of 23 slices shown]
[im 1/23]
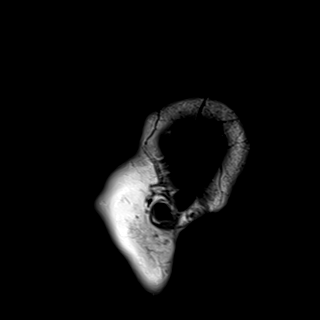
[im 23/23]
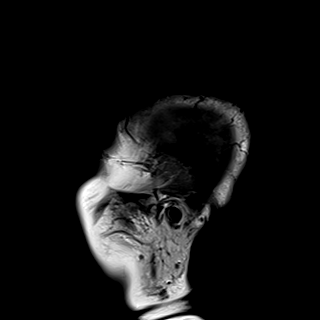

[Series 10: T2 · axial · 5.0mm · 0.72mm/px · z∈[-122,+18]mm · 2 of 25 slices shown (1 of 2)]
[im 1/25]
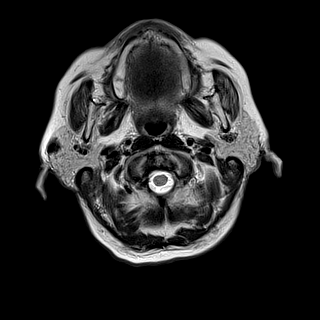
[im 25/25]
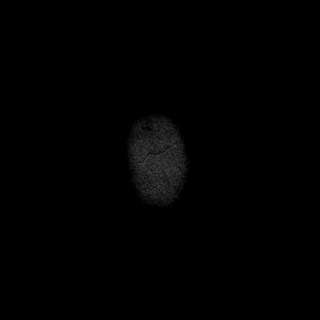

[Series 11: FLAIR · axial · 5.0mm · 0.45mm/px · z∈[-123,+18]mm · 2 of 25 slices shown]
[im 1/25]
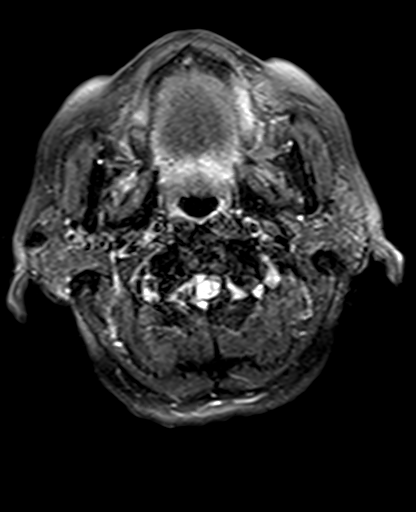
[im 25/25]
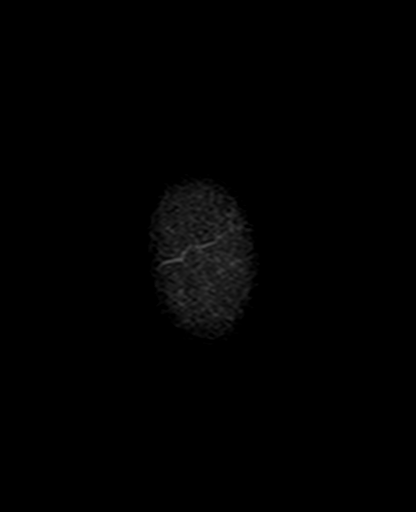

[Series 12: mag_images · axial · 3.0mm · 0.90mm/px · z∈[-127,+22]mm · 4 of 52 slices shown]
[im 1/52]
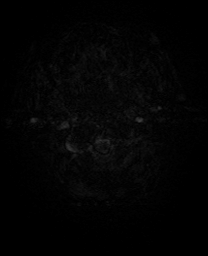
[im 18/52]
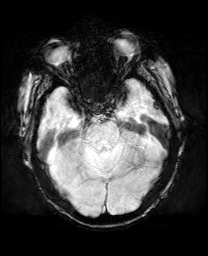
[im 35/52]
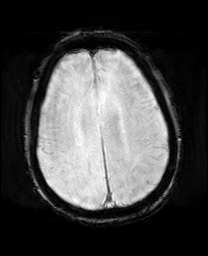
[im 52/52]
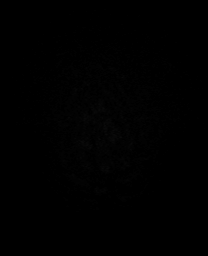

[Series 13: pha_images · axial · 3.0mm · 0.90mm/px · z∈[-124,+19]mm · 4 of 49 slices shown]
[im 1/49]
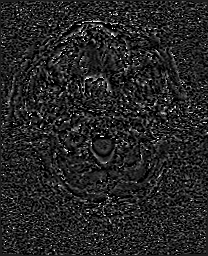
[im 17/49]
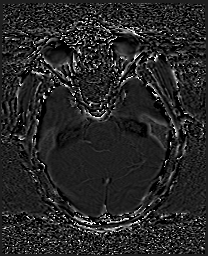
[im 33/49]
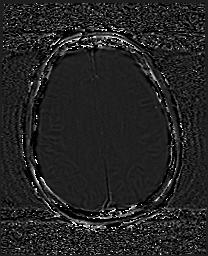
[im 49/49]
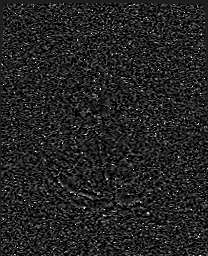

[Series 14: swi_images · axial · 3.0mm · 0.90mm/px · z∈[-127,+22]mm · 4 of 52 slices shown]
[im 1/52]
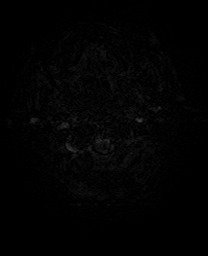
[im 18/52]
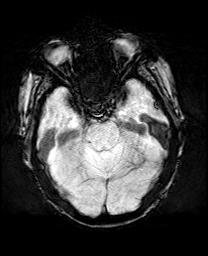
[im 35/52]
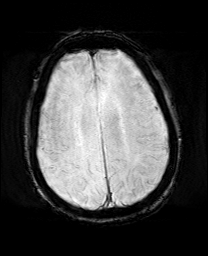
[im 52/52]
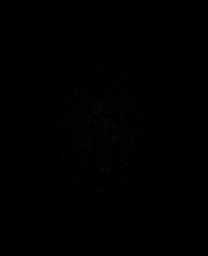

[Series 15: mip_images(sw) · axial · 24.0mm · 0.90mm/px · z∈[-117,+12]mm · 4 of 45 slices shown]
[im 1/45]
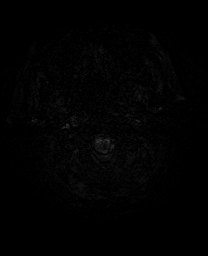
[im 15/45]
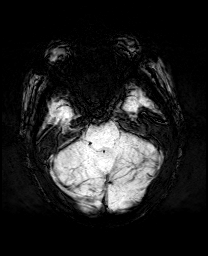
[im 30/45]
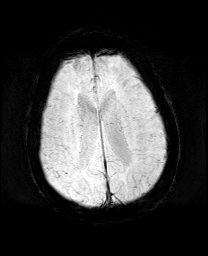
[im 45/45]
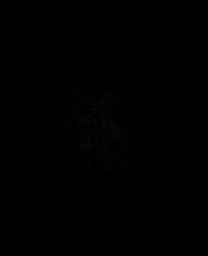

[Series 17: T2 · coronal · 5.0mm · 0.34mm/px · 2 of 29 slices shown (2 of 2)]
[im 1/29]
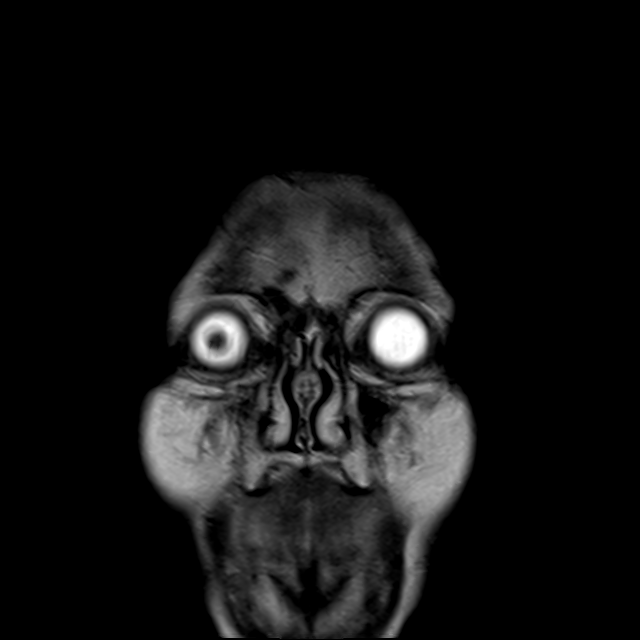
[im 29/29]
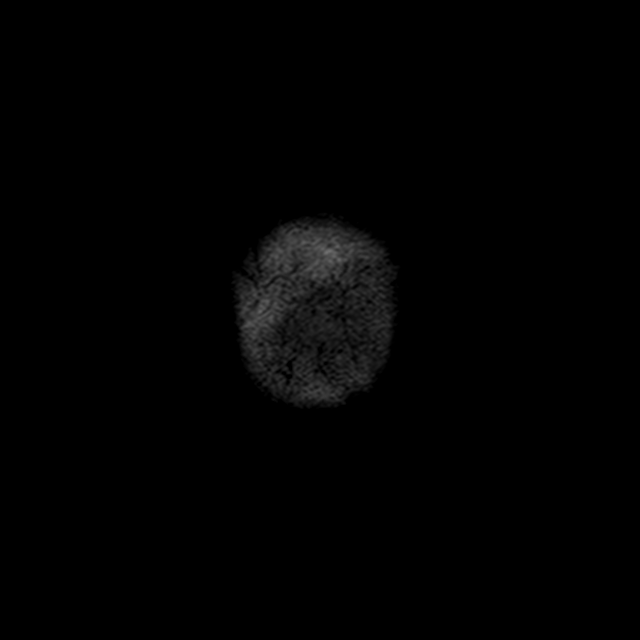

[43 of 48 positions shown; findings below may reference images not displayed]

FINDINGS: Brain: No acute infarct, mass effect or extra-axial collection. No
acute or chronic hemorrhage. There is multifocal hyperintense
T2-weighted signal within the white matter. Parenchymal volume and
CSF spaces are normal. The midline structures are normal.

Vascular: Major flow voids are preserved.

Skull and upper cervical spine: Normal calvarium and skull base.
Visualized upper cervical spine and soft tissues are normal.

Sinuses/Orbits:No paranasal sinus fluid levels or advanced mucosal
thickening. No mastoid or middle ear effusion. Normal orbits.
IMPRESSION: 1. No acute intracranial abnormality.
2. Findings of chronic small vessel ischemia.

## 2022-04-27 DIAGNOSIS — T8249XA Other complication of vascular dialysis catheter, initial encounter: Secondary | ICD-10-CM | POA: Diagnosis not present

## 2022-04-27 DIAGNOSIS — D688 Other specified coagulation defects: Secondary | ICD-10-CM | POA: Diagnosis not present

## 2022-04-27 DIAGNOSIS — Z992 Dependence on renal dialysis: Secondary | ICD-10-CM | POA: Diagnosis not present

## 2022-04-27 DIAGNOSIS — N186 End stage renal disease: Secondary | ICD-10-CM | POA: Diagnosis not present

## 2022-04-27 DIAGNOSIS — R519 Headache, unspecified: Secondary | ICD-10-CM | POA: Diagnosis not present

## 2022-04-27 DIAGNOSIS — N2581 Secondary hyperparathyroidism of renal origin: Secondary | ICD-10-CM | POA: Diagnosis not present

## 2022-04-29 DIAGNOSIS — Z992 Dependence on renal dialysis: Secondary | ICD-10-CM | POA: Diagnosis not present

## 2022-04-29 DIAGNOSIS — N2581 Secondary hyperparathyroidism of renal origin: Secondary | ICD-10-CM | POA: Diagnosis not present

## 2022-04-29 DIAGNOSIS — D688 Other specified coagulation defects: Secondary | ICD-10-CM | POA: Diagnosis not present

## 2022-04-29 DIAGNOSIS — T8249XA Other complication of vascular dialysis catheter, initial encounter: Secondary | ICD-10-CM | POA: Diagnosis not present

## 2022-04-29 DIAGNOSIS — N186 End stage renal disease: Secondary | ICD-10-CM | POA: Diagnosis not present

## 2022-04-29 DIAGNOSIS — R519 Headache, unspecified: Secondary | ICD-10-CM | POA: Diagnosis not present

## 2022-05-01 ENCOUNTER — Other Ambulatory Visit: Payer: Self-pay

## 2022-05-01 DIAGNOSIS — N186 End stage renal disease: Secondary | ICD-10-CM

## 2022-05-02 DIAGNOSIS — D688 Other specified coagulation defects: Secondary | ICD-10-CM | POA: Diagnosis not present

## 2022-05-02 DIAGNOSIS — N2581 Secondary hyperparathyroidism of renal origin: Secondary | ICD-10-CM | POA: Diagnosis not present

## 2022-05-02 DIAGNOSIS — Z992 Dependence on renal dialysis: Secondary | ICD-10-CM | POA: Diagnosis not present

## 2022-05-02 DIAGNOSIS — N186 End stage renal disease: Secondary | ICD-10-CM | POA: Diagnosis not present

## 2022-05-02 DIAGNOSIS — T8249XA Other complication of vascular dialysis catheter, initial encounter: Secondary | ICD-10-CM | POA: Diagnosis not present

## 2022-05-02 DIAGNOSIS — R519 Headache, unspecified: Secondary | ICD-10-CM | POA: Diagnosis not present

## 2022-05-04 DIAGNOSIS — R519 Headache, unspecified: Secondary | ICD-10-CM | POA: Diagnosis not present

## 2022-05-04 DIAGNOSIS — Z992 Dependence on renal dialysis: Secondary | ICD-10-CM | POA: Diagnosis not present

## 2022-05-04 DIAGNOSIS — N2581 Secondary hyperparathyroidism of renal origin: Secondary | ICD-10-CM | POA: Diagnosis not present

## 2022-05-04 DIAGNOSIS — D688 Other specified coagulation defects: Secondary | ICD-10-CM | POA: Diagnosis not present

## 2022-05-04 DIAGNOSIS — N186 End stage renal disease: Secondary | ICD-10-CM | POA: Diagnosis not present

## 2022-05-04 DIAGNOSIS — T8249XA Other complication of vascular dialysis catheter, initial encounter: Secondary | ICD-10-CM | POA: Diagnosis not present

## 2022-05-06 DIAGNOSIS — N2581 Secondary hyperparathyroidism of renal origin: Secondary | ICD-10-CM | POA: Diagnosis not present

## 2022-05-06 DIAGNOSIS — Z992 Dependence on renal dialysis: Secondary | ICD-10-CM | POA: Diagnosis not present

## 2022-05-06 DIAGNOSIS — D688 Other specified coagulation defects: Secondary | ICD-10-CM | POA: Diagnosis not present

## 2022-05-06 DIAGNOSIS — N186 End stage renal disease: Secondary | ICD-10-CM | POA: Diagnosis not present

## 2022-05-06 DIAGNOSIS — R519 Headache, unspecified: Secondary | ICD-10-CM | POA: Diagnosis not present

## 2022-05-06 DIAGNOSIS — T8249XA Other complication of vascular dialysis catheter, initial encounter: Secondary | ICD-10-CM | POA: Diagnosis not present

## 2022-05-09 ENCOUNTER — Other Ambulatory Visit (HOSPITAL_COMMUNITY): Payer: Self-pay | Admitting: Vascular Surgery

## 2022-05-09 ENCOUNTER — Other Ambulatory Visit (HOSPITAL_COMMUNITY): Payer: Self-pay | Admitting: Internal Medicine

## 2022-05-09 DIAGNOSIS — Z992 Dependence on renal dialysis: Secondary | ICD-10-CM | POA: Diagnosis not present

## 2022-05-09 DIAGNOSIS — R519 Headache, unspecified: Secondary | ICD-10-CM | POA: Diagnosis not present

## 2022-05-09 DIAGNOSIS — N186 End stage renal disease: Secondary | ICD-10-CM

## 2022-05-09 DIAGNOSIS — T8249XA Other complication of vascular dialysis catheter, initial encounter: Secondary | ICD-10-CM | POA: Diagnosis not present

## 2022-05-09 DIAGNOSIS — D688 Other specified coagulation defects: Secondary | ICD-10-CM | POA: Diagnosis not present

## 2022-05-09 DIAGNOSIS — N2581 Secondary hyperparathyroidism of renal origin: Secondary | ICD-10-CM | POA: Diagnosis not present

## 2022-05-10 ENCOUNTER — Ambulatory Visit (INDEPENDENT_AMBULATORY_CARE_PROVIDER_SITE_OTHER): Payer: Medicare Other | Admitting: Vascular Surgery

## 2022-05-10 ENCOUNTER — Encounter: Payer: Self-pay | Admitting: Vascular Surgery

## 2022-05-10 ENCOUNTER — Ambulatory Visit (INDEPENDENT_AMBULATORY_CARE_PROVIDER_SITE_OTHER)
Admission: RE | Admit: 2022-05-10 | Discharge: 2022-05-10 | Disposition: A | Discharge status: Home / Self Care | Payer: Medicare Other | Source: Ambulatory Visit | Attending: Vascular Surgery | Admitting: Vascular Surgery

## 2022-05-10 ENCOUNTER — Ambulatory Visit (HOSPITAL_COMMUNITY)
Admission: RE | Admit: 2022-05-10 | Discharge: 2022-05-10 | Disposition: A | Discharge status: Home / Self Care | Payer: Medicare Other | Source: Ambulatory Visit | Attending: Vascular Surgery | Admitting: Vascular Surgery

## 2022-05-10 VITALS — BP 138/74 | HR 79 | Temp 97.7°F | Resp 20 | Ht 60.0 in | Wt 193.0 lb

## 2022-05-10 DIAGNOSIS — N186 End stage renal disease: Secondary | ICD-10-CM | POA: Insufficient documentation

## 2022-05-10 NOTE — Progress Notes (Signed)
? ?Patient ID: Katie Garcia, female   DOB: 1941/10/13, 81 y.o.   MRN: [id number] ? ?Reason for Consult: Routine Post Op ?  ?Referred by [name],* ? ?Subjective:  ?   ?HPI: ? ?Zyan Mirkin Probasco is a 81 y.o. female with history of bilateral upper extremity dialysis access procedures.  Currently she dialyzes via left IJ tunnel catheter.  She underwent venography and then underwent placement of a basilic vein fistula in the left upper extremity.  When this had failed to mature she underwent venogram this was unsuccessful.  She now follows up for further access consideration.  She does complain of some numbness of her hand but her grip strength is intact. ? ?She currently dialyzes Tuesdays Thursdays and Saturdays. ? ?Past Medical History:  ?Diagnosis Date  ? Anemia   ? Arthritis   ? Dementia ([place])   ? Diabetes mellitus without complication ([place])   ? ESRD   ? Dialysis T/Th/Sa at [place]  ? GERD (gastroesophageal reflux disease)   ? Gout   ? Headache   ? Hypertension   ? Type 2 diabetes mellitus ([place])   ? ?Family History  ?Problem Relation Age of Onset  ? Diabetes Mother   ? ?Past Surgical History:  ?Procedure Laterality Date  ? A/V FISTULAGRAM Left 04/05/2022  ? Procedure: A/V Fistulagram;  Surgeon: Broadus John, MD;  Location: [facility];  Service: Cardiovascular;  Laterality: Left;  ? ABDOMINAL HYSTERECTOMY    ? AV FISTULA PLACEMENT Left 10/02/2016  ? Procedure: ARTERIOVENOUS (AV) FISTULA CREATION LEFT UPPER ARM;  Surgeon: Waynetta Sandy, MD;  Location: [place];  Service: Vascular;  Laterality: Left;  ? AV FISTULA PLACEMENT Right 07/14/2020  ? Procedure: RIGHT ARM ARTERIOVENOUS FISTULA CREATION;  Surgeon: Waynetta Sandy, MD;  Location: [place];  Service: Vascular;  Laterality: Right;  ? AV FISTULA PLACEMENT Left 05/16/2021  ? Procedure: INSERTION OF LEFT UPPER ARM ARTERIOVENOUS (AV) GORE-TEX GRAFT;  Surgeon: Marty Heck, MD;  Location: [place];  Service: Vascular;   Laterality: Left;  ? AV FISTULA PLACEMENT Right 07/13/2021  ? Procedure: RIGHT ARM ARTERIOVENOUS (AV) FISTULA GRAFT INSERTION;  Surgeon: Waynetta Sandy, MD;  Location: [place];  Service: Vascular;  Laterality: Right;  ? AV FISTULA PLACEMENT Left 02/03/2022  ? Procedure: LEFT UPPER ARM ARTERIOVENOUS FISTULA CREATION;  Surgeon: Waynetta Sandy, MD;  Location: [place];  Service: Vascular;  Laterality: Left;  ? BASCILIC VEIN TRANSPOSITION Left 05/17/2020  ? Procedure: Insertion of Left arm arteriovenous gortex graftarm ;  Surgeon: Angelia Mould, MD;  Location: [facility];  Service: Vascular;  Laterality: Left;  ? CESAREAN SECTION    ? DIALYSIS/PERMA CATHETER INSERTION    ? IR FLUORO GUIDE CV LINE LEFT  02/09/2022  ? IR PTA VENOUS EXCEPT DIALYSIS CIRCUIT  02/09/2022  ? IR VENOCAVAGRAM SVC  02/09/2022  ? REVISON OF ARTERIOVENOUS FISTULA Right 09/15/2020  ? Procedure: REVISON OF RIGHT UPPER ARM  ARTERIOVENOUS FISTULA;  Surgeon: Waynetta Sandy, MD;  Location: [place];  Service: Vascular;  Laterality: Right;  ? UPPER EXTREMITY VENOGRAPHY Bilateral 04/18/2021  ? Procedure: UPPER EXTREMITY VENOGRAPHY;  Surgeon: Waynetta Sandy, MD;  Location: [place] [facility];  Service: Cardiovascular;  Laterality: Bilateral;  ? UPPER EXTREMITY VENOGRAPHY Left 01/23/2022  ? Procedure: UPPER EXTREMITY VENOGRAPHY;  Surgeon: Waynetta Sandy, MD;  Location: [facility] [facility];  Service: Cardiovascular;  Laterality: Left;  ? ? ?Short Social History:  ?Social History  ? ?  Tobacco Use  ? Smoking status: Never  ? Smokeless tobacco: Never  ?Substance Use Topics  ? Alcohol use: Not Currently  ? ? ?Allergies  ?Allergen Reactions  ? Penicillins Rash  ?  Did it involve swelling of the face/tongue/throat, SOB, or low BP? No ?Did it involve sudden or severe rash/hives, skin peeling, or any reaction on the inside of your mouth or nose? Yes ?Did you need to seek medical attention at a hospital or doctor's office?  N/A ?When did it last happen? N/A      ?If all above answers are "NO", may proceed with cephalosporin use.  ? ? ?Current Outpatient Medications  ?Medication Sig Dispense Refill  ? Accu-Chek FastClix Lancets MISC     ? ACCU-CHEK GUIDE test strip 1 each by Other route in the morning and at bedtime. E11.22  1  ? acetaminophen (TYLENOL) 500 MG tablet Take 500-1,000 mg by mouth every 6 (six) hours as needed for moderate pain or headache.    ? B Complex-C-Zn-Folic Acid (DIALYVITE 947-MLYY 15) 0.8 MG TABS Take 1 tablet by mouth daily with supper.    ? Blood Glucose Monitoring Suppl (ACCU-CHEK GUIDE) w/Device KIT     ? calcium acetate (PHOSLO) 667 MG capsule Take 667 mg by mouth 3 (three) times daily with meals.  3  ? carvedilol (COREG) 25 MG tablet Take 25 mg by mouth in the morning and at bedtime.     ? famotidine (PEPCID) 20 MG tablet Take 20 mg by mouth 2 (two) times daily.    ? guaiFENesin (MUCINEX) 600 MG 12 hr tablet Take 600 mg by mouth daily as needed for to loosen phlegm.    ? guaifenesin (ROBITUSSIN) 100 MG/5ML syrup Take 100 mg by mouth 3 (three) times daily as needed for cough.    ? insulin NPH Human (NOVOLIN N) 100 UNIT/ML injection Inject 0.15 mLs (15 Units total) into the skin daily before breakfast. 20 mL 3  ? insulin regular (NOVOLIN R) 100 units/mL injection Inject 0.15 mLs (15 Units total) into the skin daily. And syringes 1/day 20 mL 3  ? iron sucrose in sodium chloride 0.9 % 100 mL Iron Sucrose (Venofer)    ? loratadine (CLARITIN) 10 MG tablet Take 10 mg by mouth daily.    ? Methoxy PEG-Epoetin Beta (MIRCERA IJ) Mircera    ? RELION INSULIN SYR 0.5ML/31G 31G X 5/16" 0.5 ML MISC USE 1 ONCE DAILY    ? rosuvastatin (CRESTOR) 10 MG tablet Take 10 mg by mouth in the morning.    ? ?Current Facility-Administered Medications  ?Medication Dose Route Frequency Provider Last Rate Last Admin  ? 0.9 %  sodium chloride infusion  250 mL Intravenous PRN Waynetta Sandy, MD      ? sodium chloride flush (NS)  0.9 % injection 3 mL  3 mL Intravenous Q12H Waynetta Sandy, MD      ? sodium chloride flush (NS) 0.9 % injection 3 mL  3 mL Intravenous PRN Waynetta Sandy, MD      ? ? ?Review of Systems  ?Constitutional:  Constitutional negative. ?HENT: HENT negative.  ?Eyes: Eyes negative.  ?Respiratory: Respiratory negative.  ?Cardiovascular: Cardiovascular negative.  ?GI: Gastrointestinal negative.  ?Musculoskeletal: Musculoskeletal negative.  ?Skin: Skin negative.  ?Neurological: Positive for numbness.  ?Hematologic: Hematologic/lymphatic negative.  ?Psychiatric: Psychiatric negative.   ? ?   ?Objective:  ?Objective  ?Vitals:  ? 05/10/22 1537  ?BP: 138/74  ?Pulse: 79  ?Resp: 20  ?Temp: 97.7 ?F (  36.5 ?C)  ?SpO2: 99%  ? ? ?Physical Exam ?HENT:  ?   Head: Normocephalic.  ?   Nose: Nose normal.  ?   Mouth/Throat:  ?   Mouth: Mucous membranes are moist.  ?Eyes:  ?   Pupils: Pupils are equal, round, and reactive to light.  ?Cardiovascular:  ?   Pulses:     ?     Radial pulses are 2+ on the right side and 2+ on the left side.  ?Pulmonary:  ?   Effort: Pulmonary effort is normal.  ?Abdominal:  ?   General: Abdomen is flat.  ?   Palpations: Abdomen is soft.  ?Musculoskeletal:  ?   Comments: There is a palpable thrill in the mid left upper arm  ?Skin: ?   General: Skin is warm.  ?Neurological:  ?   General: No focal deficit present.  ?   Mental Status: She is alert.  ? ? ?Data: ?Right Cephalic   Diameter (cm)Depth (cm) Findings   ?+-----------------+-------------+----------+----------+  ?Shoulder                                thrombosed  ?+-----------------+-------------+----------+----------+  ?Prox upper arm                          thrombosed  ?+-----------------+-------------+----------+----------+  ?Mid upper arm                           thrombosed  ?+-----------------+-------------+----------+----------+  ?Dist upper arm                          thrombosed   ?+-----------------+-------------+----------+----------+  ?Antecubital fossa                       thrombosed  ?+-----------------+-------------+----------+----------+  ?Prox forearm                            thrombosed  ?+-----------------+-------------+----------

## 2022-05-11 DIAGNOSIS — D688 Other specified coagulation defects: Secondary | ICD-10-CM | POA: Diagnosis not present

## 2022-05-11 DIAGNOSIS — T8249XA Other complication of vascular dialysis catheter, initial encounter: Secondary | ICD-10-CM | POA: Diagnosis not present

## 2022-05-11 DIAGNOSIS — N2581 Secondary hyperparathyroidism of renal origin: Secondary | ICD-10-CM | POA: Diagnosis not present

## 2022-05-11 DIAGNOSIS — N186 End stage renal disease: Secondary | ICD-10-CM | POA: Diagnosis not present

## 2022-05-11 DIAGNOSIS — R519 Headache, unspecified: Secondary | ICD-10-CM | POA: Diagnosis not present

## 2022-05-11 DIAGNOSIS — Z992 Dependence on renal dialysis: Secondary | ICD-10-CM | POA: Diagnosis not present

## 2022-05-12 DIAGNOSIS — E113212 Type 2 diabetes mellitus with mild nonproliferative diabetic retinopathy with macular edema, left eye: Secondary | ICD-10-CM | POA: Diagnosis not present

## 2022-05-12 DIAGNOSIS — E113291 Type 2 diabetes mellitus with mild nonproliferative diabetic retinopathy without macular edema, right eye: Secondary | ICD-10-CM | POA: Diagnosis not present

## 2022-05-12 DIAGNOSIS — H401131 Primary open-angle glaucoma, bilateral, mild stage: Secondary | ICD-10-CM | POA: Diagnosis not present

## 2022-05-13 DIAGNOSIS — N2581 Secondary hyperparathyroidism of renal origin: Secondary | ICD-10-CM | POA: Diagnosis not present

## 2022-05-13 DIAGNOSIS — N186 End stage renal disease: Secondary | ICD-10-CM | POA: Diagnosis not present

## 2022-05-13 DIAGNOSIS — Z992 Dependence on renal dialysis: Secondary | ICD-10-CM | POA: Diagnosis not present

## 2022-05-13 DIAGNOSIS — T8249XA Other complication of vascular dialysis catheter, initial encounter: Secondary | ICD-10-CM | POA: Diagnosis not present

## 2022-05-13 DIAGNOSIS — D688 Other specified coagulation defects: Secondary | ICD-10-CM | POA: Diagnosis not present

## 2022-05-13 DIAGNOSIS — R519 Headache, unspecified: Secondary | ICD-10-CM | POA: Diagnosis not present

## 2022-05-15 ENCOUNTER — Ambulatory Visit: Payer: Medicare Other | Admitting: Endocrinology

## 2022-05-16 ENCOUNTER — Other Ambulatory Visit: Payer: Self-pay

## 2022-05-16 DIAGNOSIS — Z992 Dependence on renal dialysis: Secondary | ICD-10-CM | POA: Diagnosis not present

## 2022-05-16 DIAGNOSIS — N2581 Secondary hyperparathyroidism of renal origin: Secondary | ICD-10-CM | POA: Diagnosis not present

## 2022-05-16 DIAGNOSIS — D688 Other specified coagulation defects: Secondary | ICD-10-CM | POA: Diagnosis not present

## 2022-05-16 DIAGNOSIS — N186 End stage renal disease: Secondary | ICD-10-CM

## 2022-05-16 DIAGNOSIS — T8249XA Other complication of vascular dialysis catheter, initial encounter: Secondary | ICD-10-CM | POA: Diagnosis not present

## 2022-05-16 DIAGNOSIS — R519 Headache, unspecified: Secondary | ICD-10-CM | POA: Diagnosis not present

## 2022-05-18 DIAGNOSIS — N2581 Secondary hyperparathyroidism of renal origin: Secondary | ICD-10-CM | POA: Diagnosis not present

## 2022-05-18 DIAGNOSIS — Z992 Dependence on renal dialysis: Secondary | ICD-10-CM | POA: Diagnosis not present

## 2022-05-18 DIAGNOSIS — D688 Other specified coagulation defects: Secondary | ICD-10-CM | POA: Diagnosis not present

## 2022-05-18 DIAGNOSIS — T8249XA Other complication of vascular dialysis catheter, initial encounter: Secondary | ICD-10-CM | POA: Diagnosis not present

## 2022-05-18 DIAGNOSIS — N186 End stage renal disease: Secondary | ICD-10-CM | POA: Diagnosis not present

## 2022-05-18 DIAGNOSIS — R519 Headache, unspecified: Secondary | ICD-10-CM | POA: Diagnosis not present

## 2022-05-20 DIAGNOSIS — N2581 Secondary hyperparathyroidism of renal origin: Secondary | ICD-10-CM | POA: Diagnosis not present

## 2022-05-20 DIAGNOSIS — N186 End stage renal disease: Secondary | ICD-10-CM | POA: Diagnosis not present

## 2022-05-20 DIAGNOSIS — Z992 Dependence on renal dialysis: Secondary | ICD-10-CM | POA: Diagnosis not present

## 2022-05-20 DIAGNOSIS — R519 Headache, unspecified: Secondary | ICD-10-CM | POA: Diagnosis not present

## 2022-05-20 DIAGNOSIS — D688 Other specified coagulation defects: Secondary | ICD-10-CM | POA: Diagnosis not present

## 2022-05-20 DIAGNOSIS — T8249XA Other complication of vascular dialysis catheter, initial encounter: Secondary | ICD-10-CM | POA: Diagnosis not present

## 2022-05-23 DIAGNOSIS — T8249XA Other complication of vascular dialysis catheter, initial encounter: Secondary | ICD-10-CM | POA: Diagnosis not present

## 2022-05-23 DIAGNOSIS — Z992 Dependence on renal dialysis: Secondary | ICD-10-CM | POA: Diagnosis not present

## 2022-05-23 DIAGNOSIS — N2581 Secondary hyperparathyroidism of renal origin: Secondary | ICD-10-CM | POA: Diagnosis not present

## 2022-05-23 DIAGNOSIS — R519 Headache, unspecified: Secondary | ICD-10-CM | POA: Diagnosis not present

## 2022-05-23 DIAGNOSIS — D688 Other specified coagulation defects: Secondary | ICD-10-CM | POA: Diagnosis not present

## 2022-05-23 DIAGNOSIS — N186 End stage renal disease: Secondary | ICD-10-CM | POA: Diagnosis not present

## 2022-05-24 DIAGNOSIS — Z992 Dependence on renal dialysis: Secondary | ICD-10-CM | POA: Diagnosis not present

## 2022-05-24 DIAGNOSIS — N186 End stage renal disease: Secondary | ICD-10-CM | POA: Diagnosis not present

## 2022-05-24 DIAGNOSIS — E1122 Type 2 diabetes mellitus with diabetic chronic kidney disease: Secondary | ICD-10-CM | POA: Diagnosis not present

## 2022-05-25 DIAGNOSIS — T8249XA Other complication of vascular dialysis catheter, initial encounter: Secondary | ICD-10-CM | POA: Diagnosis not present

## 2022-05-25 DIAGNOSIS — D688 Other specified coagulation defects: Secondary | ICD-10-CM | POA: Diagnosis not present

## 2022-05-25 DIAGNOSIS — R519 Headache, unspecified: Secondary | ICD-10-CM | POA: Diagnosis not present

## 2022-05-25 DIAGNOSIS — Z992 Dependence on renal dialysis: Secondary | ICD-10-CM | POA: Diagnosis not present

## 2022-05-25 DIAGNOSIS — D631 Anemia in chronic kidney disease: Secondary | ICD-10-CM | POA: Diagnosis not present

## 2022-05-25 DIAGNOSIS — N186 End stage renal disease: Secondary | ICD-10-CM | POA: Diagnosis not present

## 2022-05-25 DIAGNOSIS — E1129 Type 2 diabetes mellitus with other diabetic kidney complication: Secondary | ICD-10-CM | POA: Diagnosis not present

## 2022-05-25 DIAGNOSIS — N2581 Secondary hyperparathyroidism of renal origin: Secondary | ICD-10-CM | POA: Diagnosis not present

## 2022-05-27 DIAGNOSIS — N186 End stage renal disease: Secondary | ICD-10-CM | POA: Diagnosis not present

## 2022-05-27 DIAGNOSIS — R519 Headache, unspecified: Secondary | ICD-10-CM | POA: Diagnosis not present

## 2022-05-27 DIAGNOSIS — N2581 Secondary hyperparathyroidism of renal origin: Secondary | ICD-10-CM | POA: Diagnosis not present

## 2022-05-27 DIAGNOSIS — Z992 Dependence on renal dialysis: Secondary | ICD-10-CM | POA: Diagnosis not present

## 2022-05-27 DIAGNOSIS — D688 Other specified coagulation defects: Secondary | ICD-10-CM | POA: Diagnosis not present

## 2022-05-27 DIAGNOSIS — E1129 Type 2 diabetes mellitus with other diabetic kidney complication: Secondary | ICD-10-CM | POA: Diagnosis not present

## 2022-05-27 DIAGNOSIS — D631 Anemia in chronic kidney disease: Secondary | ICD-10-CM | POA: Diagnosis not present

## 2022-05-27 DIAGNOSIS — T8249XA Other complication of vascular dialysis catheter, initial encounter: Secondary | ICD-10-CM | POA: Diagnosis not present

## 2022-05-30 DIAGNOSIS — D688 Other specified coagulation defects: Secondary | ICD-10-CM | POA: Diagnosis not present

## 2022-05-30 DIAGNOSIS — E1129 Type 2 diabetes mellitus with other diabetic kidney complication: Secondary | ICD-10-CM | POA: Diagnosis not present

## 2022-05-30 DIAGNOSIS — N186 End stage renal disease: Secondary | ICD-10-CM | POA: Diagnosis not present

## 2022-05-30 DIAGNOSIS — R519 Headache, unspecified: Secondary | ICD-10-CM | POA: Diagnosis not present

## 2022-05-30 DIAGNOSIS — D631 Anemia in chronic kidney disease: Secondary | ICD-10-CM | POA: Diagnosis not present

## 2022-05-30 DIAGNOSIS — T8249XA Other complication of vascular dialysis catheter, initial encounter: Secondary | ICD-10-CM | POA: Diagnosis not present

## 2022-05-30 DIAGNOSIS — N2581 Secondary hyperparathyroidism of renal origin: Secondary | ICD-10-CM | POA: Diagnosis not present

## 2022-05-30 DIAGNOSIS — Z992 Dependence on renal dialysis: Secondary | ICD-10-CM | POA: Diagnosis not present

## 2022-06-01 DIAGNOSIS — D631 Anemia in chronic kidney disease: Secondary | ICD-10-CM | POA: Diagnosis not present

## 2022-06-01 DIAGNOSIS — N186 End stage renal disease: Secondary | ICD-10-CM | POA: Diagnosis not present

## 2022-06-01 DIAGNOSIS — R519 Headache, unspecified: Secondary | ICD-10-CM | POA: Diagnosis not present

## 2022-06-01 DIAGNOSIS — T8249XA Other complication of vascular dialysis catheter, initial encounter: Secondary | ICD-10-CM | POA: Diagnosis not present

## 2022-06-01 DIAGNOSIS — Z992 Dependence on renal dialysis: Secondary | ICD-10-CM | POA: Diagnosis not present

## 2022-06-01 DIAGNOSIS — E1129 Type 2 diabetes mellitus with other diabetic kidney complication: Secondary | ICD-10-CM | POA: Diagnosis not present

## 2022-06-01 DIAGNOSIS — N2581 Secondary hyperparathyroidism of renal origin: Secondary | ICD-10-CM | POA: Diagnosis not present

## 2022-06-01 DIAGNOSIS — D688 Other specified coagulation defects: Secondary | ICD-10-CM | POA: Diagnosis not present

## 2022-06-03 DIAGNOSIS — N186 End stage renal disease: Secondary | ICD-10-CM | POA: Diagnosis not present

## 2022-06-03 DIAGNOSIS — T8249XA Other complication of vascular dialysis catheter, initial encounter: Secondary | ICD-10-CM | POA: Diagnosis not present

## 2022-06-03 DIAGNOSIS — Z992 Dependence on renal dialysis: Secondary | ICD-10-CM | POA: Diagnosis not present

## 2022-06-03 DIAGNOSIS — R519 Headache, unspecified: Secondary | ICD-10-CM | POA: Diagnosis not present

## 2022-06-03 DIAGNOSIS — N2581 Secondary hyperparathyroidism of renal origin: Secondary | ICD-10-CM | POA: Diagnosis not present

## 2022-06-03 DIAGNOSIS — D688 Other specified coagulation defects: Secondary | ICD-10-CM | POA: Diagnosis not present

## 2022-06-03 DIAGNOSIS — D631 Anemia in chronic kidney disease: Secondary | ICD-10-CM | POA: Diagnosis not present

## 2022-06-03 DIAGNOSIS — E1129 Type 2 diabetes mellitus with other diabetic kidney complication: Secondary | ICD-10-CM | POA: Diagnosis not present

## 2022-06-06 DIAGNOSIS — T8249XA Other complication of vascular dialysis catheter, initial encounter: Secondary | ICD-10-CM | POA: Diagnosis not present

## 2022-06-06 DIAGNOSIS — D688 Other specified coagulation defects: Secondary | ICD-10-CM | POA: Diagnosis not present

## 2022-06-06 DIAGNOSIS — D631 Anemia in chronic kidney disease: Secondary | ICD-10-CM | POA: Diagnosis not present

## 2022-06-06 DIAGNOSIS — R519 Headache, unspecified: Secondary | ICD-10-CM | POA: Diagnosis not present

## 2022-06-06 DIAGNOSIS — N2581 Secondary hyperparathyroidism of renal origin: Secondary | ICD-10-CM | POA: Diagnosis not present

## 2022-06-06 DIAGNOSIS — E1129 Type 2 diabetes mellitus with other diabetic kidney complication: Secondary | ICD-10-CM | POA: Diagnosis not present

## 2022-06-06 DIAGNOSIS — N186 End stage renal disease: Secondary | ICD-10-CM | POA: Diagnosis not present

## 2022-06-06 DIAGNOSIS — Z992 Dependence on renal dialysis: Secondary | ICD-10-CM | POA: Diagnosis not present

## 2022-06-08 DIAGNOSIS — D688 Other specified coagulation defects: Secondary | ICD-10-CM | POA: Diagnosis not present

## 2022-06-08 DIAGNOSIS — D631 Anemia in chronic kidney disease: Secondary | ICD-10-CM | POA: Diagnosis not present

## 2022-06-08 DIAGNOSIS — N2581 Secondary hyperparathyroidism of renal origin: Secondary | ICD-10-CM | POA: Diagnosis not present

## 2022-06-08 DIAGNOSIS — R519 Headache, unspecified: Secondary | ICD-10-CM | POA: Diagnosis not present

## 2022-06-08 DIAGNOSIS — E1129 Type 2 diabetes mellitus with other diabetic kidney complication: Secondary | ICD-10-CM | POA: Diagnosis not present

## 2022-06-08 DIAGNOSIS — N186 End stage renal disease: Secondary | ICD-10-CM | POA: Diagnosis not present

## 2022-06-08 DIAGNOSIS — Z992 Dependence on renal dialysis: Secondary | ICD-10-CM | POA: Diagnosis not present

## 2022-06-08 DIAGNOSIS — T8249XA Other complication of vascular dialysis catheter, initial encounter: Secondary | ICD-10-CM | POA: Diagnosis not present

## 2022-06-10 DIAGNOSIS — T8249XA Other complication of vascular dialysis catheter, initial encounter: Secondary | ICD-10-CM | POA: Diagnosis not present

## 2022-06-10 DIAGNOSIS — D631 Anemia in chronic kidney disease: Secondary | ICD-10-CM | POA: Diagnosis not present

## 2022-06-10 DIAGNOSIS — D688 Other specified coagulation defects: Secondary | ICD-10-CM | POA: Diagnosis not present

## 2022-06-10 DIAGNOSIS — E1129 Type 2 diabetes mellitus with other diabetic kidney complication: Secondary | ICD-10-CM | POA: Diagnosis not present

## 2022-06-10 DIAGNOSIS — Z992 Dependence on renal dialysis: Secondary | ICD-10-CM | POA: Diagnosis not present

## 2022-06-10 DIAGNOSIS — N2581 Secondary hyperparathyroidism of renal origin: Secondary | ICD-10-CM | POA: Diagnosis not present

## 2022-06-10 DIAGNOSIS — N186 End stage renal disease: Secondary | ICD-10-CM | POA: Diagnosis not present

## 2022-06-10 DIAGNOSIS — R519 Headache, unspecified: Secondary | ICD-10-CM | POA: Diagnosis not present

## 2022-06-13 DIAGNOSIS — D631 Anemia in chronic kidney disease: Secondary | ICD-10-CM | POA: Diagnosis not present

## 2022-06-13 DIAGNOSIS — R519 Headache, unspecified: Secondary | ICD-10-CM | POA: Diagnosis not present

## 2022-06-13 DIAGNOSIS — N186 End stage renal disease: Secondary | ICD-10-CM | POA: Diagnosis not present

## 2022-06-13 DIAGNOSIS — N2581 Secondary hyperparathyroidism of renal origin: Secondary | ICD-10-CM | POA: Diagnosis not present

## 2022-06-13 DIAGNOSIS — T8249XA Other complication of vascular dialysis catheter, initial encounter: Secondary | ICD-10-CM | POA: Diagnosis not present

## 2022-06-13 DIAGNOSIS — Z992 Dependence on renal dialysis: Secondary | ICD-10-CM | POA: Diagnosis not present

## 2022-06-13 DIAGNOSIS — E1129 Type 2 diabetes mellitus with other diabetic kidney complication: Secondary | ICD-10-CM | POA: Diagnosis not present

## 2022-06-13 DIAGNOSIS — D688 Other specified coagulation defects: Secondary | ICD-10-CM | POA: Diagnosis not present

## 2022-06-15 DIAGNOSIS — R519 Headache, unspecified: Secondary | ICD-10-CM | POA: Diagnosis not present

## 2022-06-15 DIAGNOSIS — N186 End stage renal disease: Secondary | ICD-10-CM | POA: Diagnosis not present

## 2022-06-15 DIAGNOSIS — Z992 Dependence on renal dialysis: Secondary | ICD-10-CM | POA: Diagnosis not present

## 2022-06-15 DIAGNOSIS — E1129 Type 2 diabetes mellitus with other diabetic kidney complication: Secondary | ICD-10-CM | POA: Diagnosis not present

## 2022-06-15 DIAGNOSIS — D688 Other specified coagulation defects: Secondary | ICD-10-CM | POA: Diagnosis not present

## 2022-06-15 DIAGNOSIS — D631 Anemia in chronic kidney disease: Secondary | ICD-10-CM | POA: Diagnosis not present

## 2022-06-15 DIAGNOSIS — N2581 Secondary hyperparathyroidism of renal origin: Secondary | ICD-10-CM | POA: Diagnosis not present

## 2022-06-15 DIAGNOSIS — T8249XA Other complication of vascular dialysis catheter, initial encounter: Secondary | ICD-10-CM | POA: Diagnosis not present

## 2022-06-17 DIAGNOSIS — D688 Other specified coagulation defects: Secondary | ICD-10-CM | POA: Diagnosis not present

## 2022-06-17 DIAGNOSIS — N186 End stage renal disease: Secondary | ICD-10-CM | POA: Diagnosis not present

## 2022-06-17 DIAGNOSIS — T8249XA Other complication of vascular dialysis catheter, initial encounter: Secondary | ICD-10-CM | POA: Diagnosis not present

## 2022-06-17 DIAGNOSIS — N2581 Secondary hyperparathyroidism of renal origin: Secondary | ICD-10-CM | POA: Diagnosis not present

## 2022-06-17 DIAGNOSIS — Z992 Dependence on renal dialysis: Secondary | ICD-10-CM | POA: Diagnosis not present

## 2022-06-17 DIAGNOSIS — R519 Headache, unspecified: Secondary | ICD-10-CM | POA: Diagnosis not present

## 2022-06-17 DIAGNOSIS — E1129 Type 2 diabetes mellitus with other diabetic kidney complication: Secondary | ICD-10-CM | POA: Diagnosis not present

## 2022-06-17 DIAGNOSIS — D631 Anemia in chronic kidney disease: Secondary | ICD-10-CM | POA: Diagnosis not present

## 2022-06-20 DIAGNOSIS — T8249XA Other complication of vascular dialysis catheter, initial encounter: Secondary | ICD-10-CM | POA: Diagnosis not present

## 2022-06-20 DIAGNOSIS — D688 Other specified coagulation defects: Secondary | ICD-10-CM | POA: Diagnosis not present

## 2022-06-20 DIAGNOSIS — N2581 Secondary hyperparathyroidism of renal origin: Secondary | ICD-10-CM | POA: Diagnosis not present

## 2022-06-20 DIAGNOSIS — R519 Headache, unspecified: Secondary | ICD-10-CM | POA: Diagnosis not present

## 2022-06-20 DIAGNOSIS — Z992 Dependence on renal dialysis: Secondary | ICD-10-CM | POA: Diagnosis not present

## 2022-06-20 DIAGNOSIS — D631 Anemia in chronic kidney disease: Secondary | ICD-10-CM | POA: Diagnosis not present

## 2022-06-20 DIAGNOSIS — N186 End stage renal disease: Secondary | ICD-10-CM | POA: Diagnosis not present

## 2022-06-20 DIAGNOSIS — E1129 Type 2 diabetes mellitus with other diabetic kidney complication: Secondary | ICD-10-CM | POA: Diagnosis not present

## 2022-06-22 ENCOUNTER — Encounter (HOSPITAL_COMMUNITY): Payer: Self-pay | Admitting: Vascular Surgery

## 2022-06-22 ENCOUNTER — Other Ambulatory Visit: Payer: Self-pay

## 2022-06-22 DIAGNOSIS — N2581 Secondary hyperparathyroidism of renal origin: Secondary | ICD-10-CM | POA: Diagnosis not present

## 2022-06-22 DIAGNOSIS — D688 Other specified coagulation defects: Secondary | ICD-10-CM | POA: Diagnosis not present

## 2022-06-22 DIAGNOSIS — D631 Anemia in chronic kidney disease: Secondary | ICD-10-CM | POA: Diagnosis not present

## 2022-06-22 DIAGNOSIS — R519 Headache, unspecified: Secondary | ICD-10-CM | POA: Diagnosis not present

## 2022-06-22 DIAGNOSIS — N186 End stage renal disease: Secondary | ICD-10-CM | POA: Diagnosis not present

## 2022-06-22 DIAGNOSIS — T8249XA Other complication of vascular dialysis catheter, initial encounter: Secondary | ICD-10-CM | POA: Diagnosis not present

## 2022-06-22 DIAGNOSIS — Z992 Dependence on renal dialysis: Secondary | ICD-10-CM | POA: Diagnosis not present

## 2022-06-22 DIAGNOSIS — E1129 Type 2 diabetes mellitus with other diabetic kidney complication: Secondary | ICD-10-CM | POA: Diagnosis not present

## 2022-06-22 NOTE — Progress Notes (Signed)
Spoke with pt's daughter, Marliss Coots for pre-op call. Pt does not have a cardiac history. Pt does have hx of HTN and Type 2 diabetes. Belinda states pt's fasting blood sugar can be between 130-200. Last A1C was 6.7 on 06/15/22 at Dialysis Instructed Belinda to have pt check her blood sugar when she wakes up in the AM. Instructed her to have pt take 1/2 of her regular does of Novolin N in the AM, she will take 7 units. If blood sugar is >220 take 1/2 of usual correction dose of Novolin R insulin. If blood sugar is 70 or below, treat with 1/2 cup of clear juice (apple or cranberry) and recheck blood sugar 15 minutes after drinking juice.  Shower instructions given to [employer]. She voiced understanding.

## 2022-06-23 ENCOUNTER — Ambulatory Visit (HOSPITAL_COMMUNITY): Payer: Medicare Other | Admitting: Physician Assistant

## 2022-06-23 ENCOUNTER — Ambulatory Visit (HOSPITAL_COMMUNITY)
Admission: RE | Admit: 2022-06-23 | Discharge: 2022-06-23 | Disposition: A | Discharge status: Home / Self Care | Payer: Medicare Other | Source: Ambulatory Visit | Attending: Vascular Surgery | Admitting: Vascular Surgery

## 2022-06-23 ENCOUNTER — Encounter (HOSPITAL_COMMUNITY)
Admission: RE | Disposition: A | Discharge status: Home / Self Care | Payer: Self-pay | Source: Ambulatory Visit | Attending: Vascular Surgery

## 2022-06-23 ENCOUNTER — Encounter (HOSPITAL_COMMUNITY): Payer: Self-pay | Admitting: Vascular Surgery

## 2022-06-23 ENCOUNTER — Other Ambulatory Visit: Payer: Self-pay

## 2022-06-23 ENCOUNTER — Ambulatory Visit (HOSPITAL_BASED_OUTPATIENT_CLINIC_OR_DEPARTMENT_OTHER): Payer: Medicare Other | Admitting: Physician Assistant

## 2022-06-23 DIAGNOSIS — Y713 Surgical instruments, materials and cardiovascular devices (including sutures) associated with adverse incidents: Secondary | ICD-10-CM | POA: Diagnosis not present

## 2022-06-23 DIAGNOSIS — E1122 Type 2 diabetes mellitus with diabetic chronic kidney disease: Secondary | ICD-10-CM

## 2022-06-23 DIAGNOSIS — N186 End stage renal disease: Secondary | ICD-10-CM

## 2022-06-23 DIAGNOSIS — Y832 Surgical operation with anastomosis, bypass or graft as the cause of abnormal reaction of the patient, or of later complication, without mention of misadventure at the time of the procedure: Secondary | ICD-10-CM | POA: Diagnosis not present

## 2022-06-23 DIAGNOSIS — E1151 Type 2 diabetes mellitus with diabetic peripheral angiopathy without gangrene: Secondary | ICD-10-CM | POA: Diagnosis not present

## 2022-06-23 DIAGNOSIS — K219 Gastro-esophageal reflux disease without esophagitis: Secondary | ICD-10-CM | POA: Insufficient documentation

## 2022-06-23 DIAGNOSIS — Z992 Dependence on renal dialysis: Secondary | ICD-10-CM | POA: Insufficient documentation

## 2022-06-23 DIAGNOSIS — N185 Chronic kidney disease, stage 5: Secondary | ICD-10-CM | POA: Diagnosis not present

## 2022-06-23 DIAGNOSIS — T82868A Thrombosis of vascular prosthetic devices, implants and grafts, initial encounter: Secondary | ICD-10-CM | POA: Diagnosis not present

## 2022-06-23 DIAGNOSIS — I120 Hypertensive chronic kidney disease with stage 5 chronic kidney disease or end stage renal disease: Secondary | ICD-10-CM | POA: Diagnosis not present

## 2022-06-23 DIAGNOSIS — Z794 Long term (current) use of insulin: Secondary | ICD-10-CM | POA: Insufficient documentation

## 2022-06-23 HISTORY — PX: AV FISTULA PLACEMENT: SHX1204

## 2022-06-23 HISTORY — DX: COVID-19: U07.1

## 2022-06-23 LAB — POCT I-STAT, CHEM 8
BUN: 30 mg/dL — ABNORMAL HIGH (ref 8–23)
Calcium, Ion: 1.09 mmol/L — ABNORMAL LOW (ref 1.15–1.40)
Chloride: 103 mmol/L (ref 98–111)
Creatinine, Ser: 7.1 mg/dL — ABNORMAL HIGH (ref 0.44–1.00)
Glucose, Bld: 189 mg/dL — ABNORMAL HIGH (ref 70–99)
HCT: 40 % (ref 36.0–46.0)
Hemoglobin: 13.6 g/dL (ref 12.0–15.0)
Potassium: 4.1 mmol/L (ref 3.5–5.1)
Sodium: 137 mmol/L (ref 135–145)
TCO2: 26 mmol/L (ref 22–32)

## 2022-06-23 LAB — GLUCOSE, CAPILLARY
Glucose-Capillary: 168 mg/dL — ABNORMAL HIGH (ref 70–99)
Glucose-Capillary: 198 mg/dL — ABNORMAL HIGH (ref 70–99)

## 2022-06-23 SURGERY — INSERTION OF ARTERIOVENOUS (AV) GORE-TEX GRAFT ARM
Anesthesia: General | Site: Arm Upper | Laterality: Left

## 2022-06-23 MED ORDER — PROPOFOL 10 MG/ML IV BOLUS
INTRAVENOUS | Status: DC | PRN
  Administered 2022-06-23: 150 mg via INTRAVENOUS
  Administered 2022-06-23: 40 mg via INTRAVENOUS

## 2022-06-23 MED ORDER — INSULIN ASPART 100 UNIT/ML IJ SOLN: 0.0000 [IU] | INTRAMUSCULAR | Status: DC | PRN

## 2022-06-23 MED ORDER — LIDOCAINE-EPINEPHRINE (PF) 1 %-1:200000 IJ SOLN
INTRAMUSCULAR | Status: AC
  Filled 2022-06-23: qty 30

## 2022-06-23 MED ORDER — HEPARIN 6000 UNIT IRRIGATION SOLUTION
Status: AC
  Filled 2022-06-23: qty 500

## 2022-06-23 MED ORDER — CARVEDILOL 12.5 MG PO TABS
25.0000 mg | ORAL_TABLET | Freq: Once | ORAL | Status: AC
  Administered 2022-06-23: 25 mg via ORAL

## 2022-06-23 MED ORDER — CHLORHEXIDINE GLUCONATE 0.12 % MT SOLN
15.0000 mL | Freq: Once | OROMUCOSAL | Status: AC
  Administered 2022-06-23: 15 mL via OROMUCOSAL
  Filled 2022-06-23: qty 15

## 2022-06-23 MED ORDER — ONDANSETRON HCL 4 MG/2ML IJ SOLN: 4.0000 mg | Freq: Once | INTRAMUSCULAR | Status: DC | PRN

## 2022-06-23 MED ORDER — AMISULPRIDE (ANTIEMETIC) 5 MG/2ML IV SOLN: 10.0000 mg | Freq: Once | INTRAVENOUS | Status: DC | PRN

## 2022-06-23 MED ORDER — 0.9 % SODIUM CHLORIDE (POUR BTL) OPTIME
TOPICAL | Status: DC | PRN
  Administered 2022-06-23: 1000 mL

## 2022-06-23 MED ORDER — PHENYLEPHRINE 80 MCG/ML (10ML) SYRINGE FOR IV PUSH (FOR BLOOD PRESSURE SUPPORT): PREFILLED_SYRINGE | INTRAVENOUS | Status: DC | PRN

## 2022-06-23 MED ORDER — DEXAMETHASONE SODIUM PHOSPHATE 10 MG/ML IJ SOLN
INTRAMUSCULAR | Status: DC | PRN
  Administered 2022-06-23: 10 mg via INTRAVENOUS

## 2022-06-23 MED ORDER — OXYCODONE-ACETAMINOPHEN 5-325 MG PO TABS: 1.0000 | ORAL_TABLET | Freq: Four times a day (QID) | ORAL | 0 refills | Status: AC | PRN

## 2022-06-23 MED ORDER — FENTANYL CITRATE (PF) 250 MCG/5ML IJ SOLN
INTRAMUSCULAR | Status: DC | PRN
Start: 2022-06-23 — End: 2022-06-23
  Administered 2022-06-23 (×3): 50 ug via INTRAVENOUS

## 2022-06-23 MED ORDER — CHLORHEXIDINE GLUCONATE 4 % EX LIQD: 60.0000 mL | Freq: Once | CUTANEOUS | Status: DC

## 2022-06-23 MED ORDER — CARVEDILOL 12.5 MG PO TABS
ORAL_TABLET | ORAL | Status: DC
Start: 2022-06-23 — End: 2022-06-23
  Filled 2022-06-23: qty 2

## 2022-06-23 MED ORDER — HEMOSTATIC AGENTS (NO CHARGE) OPTIME
TOPICAL | Status: DC | PRN
  Administered 2022-06-23: 1 via TOPICAL

## 2022-06-23 MED ORDER — ORAL CARE MOUTH RINSE: 15.0000 mL | Freq: Once | OROMUCOSAL | Status: AC

## 2022-06-23 MED ORDER — EPHEDRINE SULFATE-NACL 50-0.9 MG/10ML-% IV SOSY
PREFILLED_SYRINGE | INTRAVENOUS | Status: DC | PRN
  Administered 2022-06-23: 5 mg via INTRAVENOUS
  Administered 2022-06-23: 15 mg via INTRAVENOUS
  Administered 2022-06-23: 5 mg via INTRAVENOUS

## 2022-06-23 MED ORDER — PHENYLEPHRINE 80 MCG/ML (10ML) SYRINGE FOR IV PUSH (FOR BLOOD PRESSURE SUPPORT)
PREFILLED_SYRINGE | INTRAVENOUS | Status: DC | PRN
  Administered 2022-06-23: 160 ug via INTRAVENOUS

## 2022-06-23 MED ORDER — LIDOCAINE 2% (20 MG/ML) 5 ML SYRINGE
INTRAMUSCULAR | Status: DC | PRN
  Administered 2022-06-23: 60 mg via INTRAVENOUS

## 2022-06-23 MED ORDER — FENTANYL CITRATE (PF) 250 MCG/5ML IJ SOLN
INTRAMUSCULAR | Status: AC
  Filled 2022-06-23: qty 5

## 2022-06-23 MED ORDER — ONDANSETRON HCL 4 MG/2ML IJ SOLN
INTRAMUSCULAR | Status: DC | PRN
  Administered 2022-06-23: 4 mg via INTRAVENOUS

## 2022-06-23 MED ORDER — FENTANYL CITRATE (PF) 100 MCG/2ML IJ SOLN: 25.0000 ug | INTRAMUSCULAR | Status: DC | PRN

## 2022-06-23 MED ORDER — SODIUM CHLORIDE 0.9 % IV SOLN: INTRAVENOUS | Status: DC

## 2022-06-23 MED ORDER — ACETAMINOPHEN 325 MG PO TABS
650.0000 mg | ORAL_TABLET | Freq: Once | ORAL | Status: AC
  Administered 2022-06-23: 650 mg via ORAL
  Filled 2022-06-23: qty 2

## 2022-06-23 MED ORDER — VANCOMYCIN HCL IN DEXTROSE 1-5 GM/200ML-% IV SOLN
1000.0000 mg | INTRAVENOUS | Status: AC
  Administered 2022-06-23: 1000 mg via INTRAVENOUS
  Filled 2022-06-23: qty 200

## 2022-06-23 MED ORDER — HEPARIN 6000 UNIT IRRIGATION SOLUTION
Status: DC | PRN
  Administered 2022-06-23: 1

## 2022-06-23 MED ORDER — PHENYLEPHRINE HCL-NACL 20-0.9 MG/250ML-% IV SOLN
INTRAVENOUS | Status: DC | PRN
  Administered 2022-06-23: 30 ug/min via INTRAVENOUS

## 2022-06-23 SURGICAL SUPPLY — 29 items
ARMBAND PINK RESTRICT EXTREMIT (MISCELLANEOUS) ×3 IMPLANT
CANISTER SUCT 3000ML PPV (MISCELLANEOUS) ×2 IMPLANT
CLIP LIGATING EXTRA MED SLVR (CLIP) ×2 IMPLANT
CLIP LIGATING EXTRA SM BLUE (MISCELLANEOUS) ×2 IMPLANT
DERMABOND ADVANCED (GAUZE/BANDAGES/DRESSINGS) ×1
DERMABOND ADVANCED .7 DNX12 (GAUZE/BANDAGES/DRESSINGS) ×1 IMPLANT
ELECT REM PT RETURN 9FT ADLT (ELECTROSURGICAL) ×2
ELECTRODE REM PT RTRN 9FT ADLT (ELECTROSURGICAL) ×1 IMPLANT
GLOVE BIO SURGEON STRL SZ7.5 (GLOVE) ×2 IMPLANT
GOWN STRL REUS W/ TWL LRG LVL3 (GOWN DISPOSABLE) ×2 IMPLANT
GOWN STRL REUS W/ TWL XL LVL3 (GOWN DISPOSABLE) ×1 IMPLANT
GOWN STRL REUS W/TWL LRG LVL3 (GOWN DISPOSABLE) ×4
GOWN STRL REUS W/TWL XL LVL3 (GOWN DISPOSABLE) ×2
GRAFT GORETEX STRT 4-7X45 (Vascular Products) ×1 IMPLANT
HEMOSTAT SNOW SURGICEL 2X4 (HEMOSTASIS) IMPLANT
INSERT FOGARTY SM (MISCELLANEOUS) ×3 IMPLANT
KIT BASIN OR (CUSTOM PROCEDURE TRAY) ×2 IMPLANT
KIT TURNOVER KIT B (KITS) ×2 IMPLANT
NS IRRIG 1000ML POUR BTL (IV SOLUTION) ×2 IMPLANT
PACK CV ACCESS (CUSTOM PROCEDURE TRAY) ×2 IMPLANT
PAD ARMBOARD 7.5X6 YLW CONV (MISCELLANEOUS) ×4 IMPLANT
POWDER SURGICEL 3.0 GRAM (HEMOSTASIS) ×1 IMPLANT
SUT MNCRL AB 4-0 PS2 18 (SUTURE) ×2 IMPLANT
SUT PROLENE 6 0 BV (SUTURE) ×3 IMPLANT
SUT VIC AB 3-0 SH 27 (SUTURE) ×4
SUT VIC AB 3-0 SH 27X BRD (SUTURE) ×2 IMPLANT
TOWEL GREEN STERILE (TOWEL DISPOSABLE) ×2 IMPLANT
UNDERPAD 30X36 HEAVY ABSORB (UNDERPADS AND DIAPERS) ×2 IMPLANT
WATER STERILE IRR 1000ML POUR (IV SOLUTION) ×2 IMPLANT

## 2022-06-23 NOTE — Discharge Instructions (Signed)
[facility]  Discharge Instructions  AV Fistula or Graft Surgery for Dialysis Access  Please refer to the following instructions for your post-procedure care. Your surgeon or physician assistant will discuss any changes with you.  Activity  You may drive the day following your surgery, if you are comfortable and no longer taking prescription pain medication. Resume full activity as the soreness in your incision resolves.  Bathing/Showering  You may shower after you go home. Keep your incision dry for 48 hours. Do not soak in a bathtub, hot tub, or swim until the incision heals completely. You may not shower if you have a hemodialysis catheter.  Incision Care  Clean your incision with mild soap and water after 48 hours. Pat the area dry with a clean towel. You do not need a bandage unless otherwise instructed. Do not apply any ointments or creams to your incision. You may have skin glue on your incision. Do not peel it off. It will come off on its own in about one week. Your arm may swell a bit after surgery. To reduce swelling use pillows to elevate your arm so it is above your heart. Your doctor will tell you if you need to lightly wrap your arm with an ACE bandage.  Diet  Resume your normal diet. There are not special food restrictions following this procedure. In order to heal from your surgery, it is CRITICAL to get adequate nutrition. Your body requires vitamins, minerals, and protein. Vegetables are the best source of vitamins and minerals. Vegetables also provide the perfect balance of protein. Processed food has little nutritional value, so try to avoid this.  Medications  Resume taking all of your medications. If your incision is causing pain, you may take over-the counter pain relievers such as acetaminophen (Tylenol). If you were prescribed a stronger pain medication, please be aware these medications can cause nausea and constipation. Prevent  nausea by taking the medication with a snack or meal. Avoid constipation by drinking plenty of fluids and eating foods with high amount of fiber, such as fruits, vegetables, and grains.  Do not take Tylenol if you are taking prescription pain medications.  Follow up Your surgeon may want to see you in the office following your access surgery. If so, this will be arranged at the time of your surgery.  Please call us immediately for any of the following conditions:  Increased pain, redness, drainage (pus) from your incision site Fever of 101 degrees or higher Severe or worsening pain at your incision site Hand pain or numbness.  Reduce your risk of vascular disease:  Stop smoking. If you would like help, call [facility] at 1-800-QUIT-NOW 425-467-5676) or [place] at [place] your cholesterol Maintain a desired weight Control your diabetes Keep your blood pressure down  Dialysis  It will take several weeks to several months for your new dialysis access to be ready for use. Your surgeon will determine when it is okay to use it. Your nephrologist will continue to direct your dialysis. You can continue to use your Permcath until your new access is ready for use.   06/23/2022 Lou Irigoyen Yon [id number] 08-18-1941  Surgeon(s): Waynetta Sandy, MD  Procedure(s): INSERTION OF LEFT ARM ARTERIOVENOUS (AV) GORE-TEX GRAFT   May stick graft immediately   May stick graft on designated area only:   X Do not stick left AV graft for 4 weeks    If you have any questions, please call  the office at [phone].

## 2022-06-23 NOTE — H&P (Signed)
HPI:   Katie Garcia is a 81 y.o. female with history of bilateral upper extremity dialysis access procedures.  Currently she dialyzes via left IJ tunnel catheter.  She underwent venography and then underwent placement of a basilic vein fistula in the left upper extremity.  When this had failed to mature she underwent venogram this was unsuccessful.  She now follows up for further access consideration.  She does complain of some numbness of her hand but her grip strength is intact.   She currently dialyzes Tuesdays Thursdays and Saturdays.       Past Medical History:  Diagnosis Date   Anemia     Arthritis     Dementia (HCC)     Diabetes mellitus without complication (HCC)     ESRD      Dialysis T/Th/Sa at [address]   GERD (gastroesophageal reflux disease)     Gout     Headache     Hypertension     Type 2 diabetes mellitus ([facility])           Family History  Problem Relation Age of Onset   Diabetes Mother           Past Surgical History:  Procedure Laterality Date   A/V FISTULAGRAM Left 04/05/2022    Procedure: A/V Fistulagram;  Surgeon: Broadus John, MD;  Location: [place] [facility];  Service: Cardiovascular;  Laterality: Left;   ABDOMINAL HYSTERECTOMY       AV FISTULA PLACEMENT Left 10/02/2016    Procedure: ARTERIOVENOUS (AV) FISTULA CREATION LEFT UPPER ARM;  Surgeon: Waynetta Sandy, MD;  Location: [place];  Service: Vascular;  Laterality: Left;   AV FISTULA PLACEMENT Right 07/14/2020    Procedure: RIGHT ARM ARTERIOVENOUS FISTULA CREATION;  Surgeon: Waynetta Sandy, MD;  Location: [place];  Service: Vascular;  Laterality: Right;   AV FISTULA PLACEMENT Left 05/16/2021    Procedure: INSERTION OF LEFT UPPER ARM ARTERIOVENOUS (AV) GORE-TEX GRAFT;  Surgeon: Marty Heck, MD;  Location: [place];  Service: Vascular;  Laterality: Left;   AV FISTULA PLACEMENT Right 07/13/2021    Procedure: RIGHT ARM ARTERIOVENOUS (AV) FISTULA GRAFT INSERTION;  Surgeon: Waynetta Sandy, MD;  Location: [place];  Service: Vascular;  Laterality: Right;   AV FISTULA PLACEMENT Left 02/03/2022    Procedure: LEFT UPPER ARM ARTERIOVENOUS FISTULA CREATION;  Surgeon: Waynetta Sandy, MD;  Location: [place];  Service: Vascular;  Laterality: Left;   [place] Left 05/17/2020    Procedure: Insertion of Left arm arteriovenous gortex graftarm ;  Surgeon: Angelia Mould, MD;  Location: [place];  Service: Vascular;  Laterality: Left;   CESAREAN SECTION       DIALYSIS/PERMA CATHETER INSERTION       IR FLUORO GUIDE CV LINE LEFT   02/09/2022   IR PTA VENOUS EXCEPT DIALYSIS CIRCUIT   02/09/2022   IR VENOCAVAGRAM SVC   02/09/2022   REVISON OF ARTERIOVENOUS FISTULA Right 09/15/2020    Procedure: REVISON OF RIGHT UPPER ARM  ARTERIOVENOUS FISTULA;  Surgeon: Waynetta Sandy, MD;  Location: [place];  Service: Vascular;  Laterality: Right;   UPPER EXTREMITY VENOGRAPHY Bilateral 04/18/2021    Procedure: UPPER EXTREMITY VENOGRAPHY;  Surgeon: Waynetta Sandy, MD;  Location: [place] [facility];  Service: Cardiovascular;  Laterality: Bilateral;   UPPER EXTREMITY VENOGRAPHY Left 01/23/2022    Procedure: UPPER EXTREMITY VENOGRAPHY;  Surgeon: Waynetta Sandy, MD;  Location: [place] [facility];  Service: Cardiovascular;  Laterality: Left;      Short Social History:  Social History        Tobacco Use   Smoking status: Never   Smokeless tobacco: Never  Substance Use Topics   Alcohol use: Not Currently           Allergies  Allergen Reactions   Penicillins Rash      Did it involve swelling of the face/tongue/throat, SOB, or low BP? No Did it involve sudden or severe rash/hives, skin peeling, or any reaction on the inside of your mouth or nose? Yes Did you need to seek medical attention at a hospital or doctor's office? N/A When did it last happen? N/A      If all above answers are "NO", may proceed with cephalosporin use.             Current Outpatient Medications  Medication Sig Dispense Refill   Accu-Chek FastClix Lancets MISC         ACCU-CHEK GUIDE test strip 1 each by Other route in the morning and at bedtime. E11.22   1   acetaminophen (TYLENOL) 500 MG tablet Take 500-1,000 mg by mouth every 6 (six) hours as needed for moderate pain or headache.       B Complex-C-Zn-Folic Acid (DIALYVITE 756-EPPI 15) 0.8 MG TABS Take 1 tablet by mouth daily with supper.       Blood Glucose Monitoring Suppl (ACCU-CHEK GUIDE) w/Device KIT         calcium acetate (PHOSLO) 667 MG capsule Take 667 mg by mouth 3 (three) times daily with meals.   3   carvedilol (COREG) 25 MG tablet Take 25 mg by mouth in the morning and at bedtime.        famotidine (PEPCID) 20 MG tablet Take 20 mg by mouth 2 (two) times daily.       guaiFENesin (MUCINEX) 600 MG 12 hr tablet Take 600 mg by mouth daily as needed for to loosen phlegm.       guaifenesin (ROBITUSSIN) 100 MG/5ML syrup Take 100 mg by mouth 3 (three) times daily as needed for cough.       insulin NPH Human (NOVOLIN N) 100 UNIT/ML injection Inject 0.15 mLs (15 Units total) into the skin daily before breakfast. 20 mL 3   insulin regular (NOVOLIN R) 100 units/mL injection Inject 0.15 mLs (15 Units total) into the skin daily. And syringes 1/day 20 mL 3   iron sucrose in sodium chloride 0.9 % 100 mL Iron Sucrose (Venofer)       loratadine (CLARITIN) 10 MG tablet Take 10 mg by mouth daily.       Methoxy PEG-Epoetin Beta (MIRCERA IJ) Mircera       RELION INSULIN SYR 0.5ML/31G 31G X 5/16" 0.5 ML MISC USE 1 ONCE DAILY       rosuvastatin (CRESTOR) 10 MG tablet Take 10 mg by mouth in the morning.                 Current Facility-Administered Medications  Medication Dose Route Frequency Provider Last Rate Last Admin   0.9 %  sodium chloride infusion  250 mL Intravenous PRN Waynetta Sandy, MD       sodium chloride flush (NS) 0.9 % injection 3 mL  3 mL Intravenous Q12H Waynetta Sandy,  MD       sodium chloride flush (NS) 0.9 % injection 3 mL  3 mL Intravenous PRN Waynetta Sandy, MD  Review of Systems  Constitutional:  Constitutional negative. HENT: HENT negative.  Eyes: Eyes negative.  Respiratory: Respiratory negative.  Cardiovascular: Cardiovascular negative.  GI: Gastrointestinal negative.  Musculoskeletal: Musculoskeletal negative.  Skin: Skin negative.  Neurological: Positive for numbness.  Hematologic: Hematologic/lymphatic negative.  Psychiatric: Psychiatric negative.         Objective:    Vitals:   06/23/22 0557  BP: (!) 159/74  Pulse: 80  Resp: 18  Temp: 97.8 F (36.6 C)  SpO2: 100%      Physical Exam HENT:     Head: Normocephalic.     Nose: Nose normal.     Mouth/Throat:     Mouth: Mucous membranes are moist.  Eyes:     Pupils: Pupils are equal, round, and reactive to light.  Cardiovascular:     Pulses:          Radial pulses are 2+ on the right side and 2+ on the left side.  Pulmonary:     Effort: Pulmonary effort is normal.  Abdominal:     General: Abdomen is flat.     Palpations: Abdomen is soft.  Musculoskeletal:     Comments: There is a palpable thrill in the mid left upper arm  Skin:    General: Skin is warm.  Neurological:     General: No focal deficit present.     Mental Status: She is alert.      Data: Right Cephalic   Diameter (cm)Depth (cm) Findings   +-----------------+-------------+----------+----------+  Shoulder                                thrombosed  +-----------------+-------------+----------+----------+  Prox upper arm                          thrombosed  +-----------------+-------------+----------+----------+  Mid upper arm                           thrombosed  +-----------------+-------------+----------+----------+  Dist upper arm                          thrombosed  +-----------------+-------------+----------+----------+  Antecubital fossa                        thrombosed  +-----------------+-------------+----------+----------+  Prox forearm                            thrombosed  +-----------------+-------------+----------+----------+  Mid forearm                             thrombosed  +-----------------+-------------+----------+----------+   +-----------------+-------------+----------+---------+  Right Basilic    Diameter (cm)Depth (cm)Findings   +-----------------+-------------+----------+---------+  Prox upper arm       0.29        2.16              +-----------------+-------------+----------+---------+  Mid upper arm        0.35        1.96   branching  +-----------------+-------------+----------+---------+  Dist upper arm       0.28        1.95              +-----------------+-------------+----------+---------+  Antecubital fossa    0.24  1.59              +-----------------+-------------+----------+---------+  Prox forearm         0.21        0.96              +-----------------+-------------+----------+---------+   +-----------------+-------------+----------+----------+  Left Cephalic    Diameter (cm)Depth (cm) Findings   +-----------------+-------------+----------+----------+  Shoulder                                thrombosed  +-----------------+-------------+----------+----------+  Prox upper arm                          thrombosed  +-----------------+-------------+----------+----------+  Mid upper arm                           thrombosed  +-----------------+-------------+----------+----------+  Dist upper arm                          thrombosed  +-----------------+-------------+----------+----------+  Antecubital fossa                       thrombosed  +-----------------+-------------+----------+----------+  Prox forearm         0.13        0.92               +-----------------+-------------+----------+----------+  Mid forearm           0.19        0.64               +-----------------+-------------+----------+----------+  Dist forearm         0.21        0.31               +-----------------+-------------+----------+----------+   +--------------+------------+----------+-----------------------------------  ----+  Left Basilic    Diameter  Depth (cm)               Findings                                      (cm)                                                        +--------------+------------+----------+-----------------------------------  ----+  Prox upper arm    0.48       1.67                                             +--------------+------------+----------+-----------------------------------  ----+  Mid upper arm     0.46       1.88                                             +--------------+------------+----------+-----------------------------------  ----+  Dist upper arm  joins brachial artery (prior  AV                                                         fistula)                    +--------------+------------+----------+-----------------------------------  ----+  Prox forearm                                    not visualized                +--------------+------------+----------+-----------------------------------  ----+   Summary: Right: Right cephalic vein appears thrombosed (part of a prior AV         fistula). Non-functioning AVG visualized.  Left: Left cephalic vein appears thrombosed (part of a prior AV        fistula). Non-functioning AVG visualized.    Right Pre-Dialysis Findings:  +-----------------------+----------+--------------------+--------+--------+   Location               PSV (cm/s)Intralum. Diam.  (cm)WaveformComments  +-----------------------+----------+--------------------+--------+--------+   Brachial Antecub. fossa140       0.46                biphasic             +-----------------------+----------+--------------------+--------+--------+   Radial Art at Wrist    76        0.22                biphasic            +-----------------------+----------+--------------------+--------+--------+   Ulnar Art at Wrist     73        0.17                biphasic            +-----------------------+----------+--------------------+--------+--------+      Left Pre-Dialysis Findings:  +-----------------------+----------+--------------------+--------+--------+   Location               PSV (cm/s)Intralum. Diam.  (cm)WaveformComments  +-----------------------+----------+--------------------+--------+--------+   Brachial Antecub. fossa149       0.48                biphasic            +-----------------------+----------+--------------------+--------+--------+   Radial Art at Wrist    68        0.19                biphasic            +-----------------------+----------+--------------------+--------+--------+   Ulnar Art at Wrist     67        0.18                biphasic            +-----------------------+----------+--------------------+--------+--------+         Assessment/Plan:    81 year old female has a history of bilateral upper extremity dialysis access is that failed.  She now has a basilic vein fistula in the upper arm in the left attempted fistulogram was performed but was aborted due to immature fistula.  She does have a strong thrill in the left arm and it feels by physical exam and reviewing the vein mapping that  she does have a patent basilic vein fistula and then a patent axillary vein higher up.  I discussed with her we will placing an interposition AV graft hopefully we can get this to stay open.  We will possibly use Artegraft depending on the size match at the time of surgery.  Plan for OR today.        Waynetta Sandy MD [facility]

## 2022-06-23 NOTE — Anesthesia Procedure Notes (Signed)
Procedure Name: LMA Insertion Date/Time: 06/23/2022 7:37 AM  Performed by: Minerva Ends, CRNAPre-anesthesia Checklist: Patient identified, Emergency Drugs available, Suction available and Patient being monitored Patient Re-evaluated:Patient Re-evaluated prior to induction Oxygen Delivery Method: Circle system utilized Preoxygenation: Pre-oxygenation with 100% oxygen Induction Type: IV induction Ventilation: Mask ventilation without difficulty LMA: LMA inserted LMA Size: 4.0 Tube type: Oral Number of attempts: 1 Placement Confirmation: positive ETCO2 and breath sounds checked- equal and bilateral Tube secured with: Tape Dental Injury: Teeth and Oropharynx as per pre-operative assessment

## 2022-06-23 NOTE — Transfer of Care (Signed)
Immediate Anesthesia Transfer of Care Note  Patient: Katie Garcia  Procedure(s) Performed: INSERTION OF LEFT ARM ARTERIOVENOUS (AV) GORE-TEX GRAFT (Left: Arm Upper)  Patient Location: PACU  Anesthesia Type:General  Level of Consciousness: awake, alert  and oriented  Airway & Oxygen Therapy: Patient Spontanous Breathing  Post-op Assessment: Report given to RN and Post -op Vital signs reviewed and stable  Post vital signs: Reviewed and stable  Last Vitals:  Vitals Value Taken Time  BP    Temp    Pulse    Resp    SpO2      Last Pain:  Vitals:   06/23/22 0605  TempSrc:   PainSc: 0-No pain         Complications: No notable events documented.

## 2022-06-23 NOTE — Op Note (Signed)
Patient name: Katie Garcia MRN: [id number] DOB: 1941/11/28 Sex: female  06/23/2022 Pre-operative Diagnosis: ESRD Post-operative diagnosis:  Same Surgeon:  Erlene Quan C. Donzetta Matters, MD Assistant: Paulo Fruit, PA Procedure Performed: Left arm brachial artery to axillary vein AV graft placement with 4-7 mm PTFE   Indications: 81 year old female with multiple previous dialysis accesses in her upper extremities.  More recently she has undergone basilic vein fistula creation on the left which is failed to mature and she is now indicated for conversion to graft.  Findings: Most of the basilic vein had actually thrombosed.  There were multiple grafts in the upper arm which were identified with ultrasound.  I ultimately dissected back to the brachial artery anastomosis with basilic vein and use this as the takeoff point for the 4 mm end of the graft and then there was an axillary vein that had not been anastomosed to in the past and the 7 mm graft and was sewn end to end at this level.  At completion there was a strong thrill in the graft and a palpable radial artery pulse the wrist both confirmed with Doppler and the wrist signal did augment with compression of the graft.   Procedure:  The patient was identified in the holding area and taken to the operating room where she is by supine operative table and LMA anesthesia was induced.  Her left upper extremity was sterilely prepped and draped in the usual fashion, antibiotics were minister timeout was called.  I used ultrasound to extensively evaluate her left upper extremity and then plan 2 incisions 1 above the antecubitum and 1 in the axilla.  In the antecubital we dissected down to the brachial artery where I identified a graft takeoff and the fistula takeoff which appeared clotted off with ultrasound.  I dissected around the brachial artery proximally distally and then around the vein itself.  In the axilla we dissected down to the axillary vein which was  identified with ultrasound.  There were multiple grafts in the axilla I did find one vein that was not anastomosed to marked this for orientation transected distally and tied it off and then flushed with heparinized saline and spatulated.  A 7 mm end of the graft was then sewn into and with 6-0 Prolene suture.  Upon completion we flushed through the graft itself and then clamped the vein.  The graft was then tunneled between previous existing grafts.  He was trimmed to size.  The brachial artery was clamped proximally distally and flushed heparinized saline both directions.  I spatulated from the occluded basilic vein back onto the artery.  I then sewed the graft into side with 6-0 Prolene suture.  Prior to completion we let flushing all directions.  Upon completion was a very strong thrill in the graft confirmed in the axillary vein with Doppler and a palpable radial artery pulse at the wrist confirmed with Doppler which did augment with compression of the graft.  Satisfied with this we irrigated both wounds and obtain pneumostasis and closed in layers with Vicryl and Monocryl.  Dermabond was placed at the skin level.  She was awakened from anesthesia having tolerated procedure without immediate complication.  All counts were correct at completion.  Given the complexity of the case,  the assistant was necessary in order to expedient the procedure and safely perform the technical aspects of the operation.  The assistant provided traction and countertraction to assist with exposure of the artery and vein.  They also assisted  with suture ligation of multiple venous branches. They also assisted with tunneling of the graft.  They played a critical role for both anastomoses.. These skills, especially following the Prolene suture for the anastomosis, could not have been adequately performed by a scrub tech assistant.     EBL: 50cc  Rollande Thursby C. Donzetta Matters, MD [facility] [place] Office:  [phone] Pager: [phone]

## 2022-06-23 NOTE — Anesthesia Preprocedure Evaluation (Addendum)
Anesthesia Evaluation  Patient identified by MRN, date of birth, ID band Patient awake    Reviewed: Allergy & Precautions, NPO status , Patient's Chart, lab work & pertinent test results  Airway Mallampati: II  TM Distance: >3 FB Neck ROM: Full    Dental  (+) Edentulous Upper, Edentulous Lower   Pulmonary asthma ,    Pulmonary exam normal        Cardiovascular hypertension, Pt. on medications and Pt. on home beta blockers + Peripheral Vascular Disease  Normal cardiovascular exam     Neuro/Psych  Headaches, PSYCHIATRIC DISORDERS Depression    GI/Hepatic Neg liver ROS, GERD  Medicated and Controlled,  Endo/Other  diabetes, Insulin Dependent  Renal/GU ESRF and DialysisRenal diseaseOn HD T, R, Sat     Musculoskeletal  (+) Arthritis , Gout   Abdominal   Peds  Hematology negative hematology ROS (+)   Anesthesia Other Findings ESRD  Reproductive/Obstetrics                            Anesthesia Physical Anesthesia Plan  ASA: 3  Anesthesia Plan: General   Post-op Pain Management:    Induction: Intravenous  PONV Risk Score and Plan: 3 and Ondansetron, Dexamethasone and Treatment may vary due to age or medical condition  Airway Management Planned: LMA  Additional Equipment:   Intra-op Plan:   Post-operative Plan: Extubation in OR  Informed Consent: I have reviewed the patients History and Physical, chart, labs and discussed the procedure including the risks, benefits and alternatives for the proposed anesthesia with the patient or authorized representative who has indicated his/her understanding and acceptance.       Plan Discussed with: CRNA  Anesthesia Plan Comments: Lyla Son)        Anesthesia Quick Evaluation

## 2022-06-24 DIAGNOSIS — T8249XA Other complication of vascular dialysis catheter, initial encounter: Secondary | ICD-10-CM | POA: Diagnosis not present

## 2022-06-24 DIAGNOSIS — N2581 Secondary hyperparathyroidism of renal origin: Secondary | ICD-10-CM | POA: Diagnosis not present

## 2022-06-24 DIAGNOSIS — Z992 Dependence on renal dialysis: Secondary | ICD-10-CM | POA: Diagnosis not present

## 2022-06-24 DIAGNOSIS — R519 Headache, unspecified: Secondary | ICD-10-CM | POA: Diagnosis not present

## 2022-06-24 DIAGNOSIS — N186 End stage renal disease: Secondary | ICD-10-CM | POA: Diagnosis not present

## 2022-06-24 DIAGNOSIS — D688 Other specified coagulation defects: Secondary | ICD-10-CM | POA: Diagnosis not present

## 2022-06-24 DIAGNOSIS — D631 Anemia in chronic kidney disease: Secondary | ICD-10-CM | POA: Diagnosis not present

## 2022-06-24 DIAGNOSIS — E1129 Type 2 diabetes mellitus with other diabetic kidney complication: Secondary | ICD-10-CM | POA: Diagnosis not present

## 2022-06-24 NOTE — Anesthesia Postprocedure Evaluation (Signed)
Anesthesia Post Note  Patient: Katie Garcia  Procedure(s) Performed: INSERTION OF LEFT ARM ARTERIOVENOUS (AV) GORE-TEX GRAFT (Left: Arm Upper)     Patient location during evaluation: PACU Anesthesia Type: General Level of consciousness: awake Pain management: pain level controlled Vital Signs Assessment: post-procedure vital signs reviewed and stable Respiratory status: spontaneous breathing, nonlabored ventilation, respiratory function stable and patient connected to nasal cannula oxygen Cardiovascular status: blood pressure returned to baseline and stable Postop Assessment: no apparent nausea or vomiting Anesthetic complications: no   No notable events documented.  Last Vitals:  Vitals:   06/23/22 0931 06/23/22 0946  BP: (!) 133/54 (!) 145/60  Pulse: 69 67  Resp: 13 11  Temp:  (!) 36.3 C  SpO2: 99%     Last Pain:  Vitals:   06/23/22 0946  TempSrc:   PainSc: 0-No pain                 Eniya Cannady P Kaushal Vannice

## 2022-06-27 ENCOUNTER — Encounter (HOSPITAL_COMMUNITY): Payer: Self-pay | Admitting: Vascular Surgery

## 2022-06-27 DIAGNOSIS — D631 Anemia in chronic kidney disease: Secondary | ICD-10-CM | POA: Diagnosis not present

## 2022-06-27 DIAGNOSIS — R519 Headache, unspecified: Secondary | ICD-10-CM | POA: Diagnosis not present

## 2022-06-27 DIAGNOSIS — N186 End stage renal disease: Secondary | ICD-10-CM | POA: Diagnosis not present

## 2022-06-27 DIAGNOSIS — N2581 Secondary hyperparathyroidism of renal origin: Secondary | ICD-10-CM | POA: Diagnosis not present

## 2022-06-27 DIAGNOSIS — Z992 Dependence on renal dialysis: Secondary | ICD-10-CM | POA: Diagnosis not present

## 2022-06-27 DIAGNOSIS — E1129 Type 2 diabetes mellitus with other diabetic kidney complication: Secondary | ICD-10-CM | POA: Diagnosis not present

## 2022-06-27 DIAGNOSIS — D688 Other specified coagulation defects: Secondary | ICD-10-CM | POA: Diagnosis not present

## 2022-06-27 DIAGNOSIS — T8249XA Other complication of vascular dialysis catheter, initial encounter: Secondary | ICD-10-CM | POA: Diagnosis not present

## 2022-06-28 ENCOUNTER — Ambulatory Visit (INDEPENDENT_AMBULATORY_CARE_PROVIDER_SITE_OTHER): Payer: Medicare Other | Admitting: Podiatry

## 2022-06-28 DIAGNOSIS — Z91199 Patient's noncompliance with other medical treatment and regimen due to unspecified reason: Secondary | ICD-10-CM

## 2022-06-28 NOTE — Progress Notes (Signed)
No show for appointment. Recent Vascular procedure performed. No charge.

## 2022-06-29 DIAGNOSIS — R519 Headache, unspecified: Secondary | ICD-10-CM | POA: Diagnosis not present

## 2022-06-29 DIAGNOSIS — D631 Anemia in chronic kidney disease: Secondary | ICD-10-CM | POA: Diagnosis not present

## 2022-06-29 DIAGNOSIS — N2581 Secondary hyperparathyroidism of renal origin: Secondary | ICD-10-CM | POA: Diagnosis not present

## 2022-06-29 DIAGNOSIS — D688 Other specified coagulation defects: Secondary | ICD-10-CM | POA: Diagnosis not present

## 2022-06-29 DIAGNOSIS — Z992 Dependence on renal dialysis: Secondary | ICD-10-CM | POA: Diagnosis not present

## 2022-06-29 DIAGNOSIS — T8249XA Other complication of vascular dialysis catheter, initial encounter: Secondary | ICD-10-CM | POA: Diagnosis not present

## 2022-06-29 DIAGNOSIS — N186 End stage renal disease: Secondary | ICD-10-CM | POA: Diagnosis not present

## 2022-06-29 DIAGNOSIS — E1129 Type 2 diabetes mellitus with other diabetic kidney complication: Secondary | ICD-10-CM | POA: Diagnosis not present

## 2022-07-01 DIAGNOSIS — Z992 Dependence on renal dialysis: Secondary | ICD-10-CM | POA: Diagnosis not present

## 2022-07-01 DIAGNOSIS — D631 Anemia in chronic kidney disease: Secondary | ICD-10-CM | POA: Diagnosis not present

## 2022-07-01 DIAGNOSIS — N186 End stage renal disease: Secondary | ICD-10-CM | POA: Diagnosis not present

## 2022-07-01 DIAGNOSIS — R519 Headache, unspecified: Secondary | ICD-10-CM | POA: Diagnosis not present

## 2022-07-01 DIAGNOSIS — T8249XA Other complication of vascular dialysis catheter, initial encounter: Secondary | ICD-10-CM | POA: Diagnosis not present

## 2022-07-01 DIAGNOSIS — N2581 Secondary hyperparathyroidism of renal origin: Secondary | ICD-10-CM | POA: Diagnosis not present

## 2022-07-01 DIAGNOSIS — E1129 Type 2 diabetes mellitus with other diabetic kidney complication: Secondary | ICD-10-CM | POA: Diagnosis not present

## 2022-07-01 DIAGNOSIS — D688 Other specified coagulation defects: Secondary | ICD-10-CM | POA: Diagnosis not present

## 2022-07-03 ENCOUNTER — Encounter: Payer: Self-pay | Admitting: Podiatry

## 2022-07-03 ENCOUNTER — Ambulatory Visit (INDEPENDENT_AMBULATORY_CARE_PROVIDER_SITE_OTHER): Payer: Medicare Other | Admitting: Podiatry

## 2022-07-03 DIAGNOSIS — L84 Corns and callosities: Secondary | ICD-10-CM

## 2022-07-03 DIAGNOSIS — Z992 Dependence on renal dialysis: Secondary | ICD-10-CM

## 2022-07-03 DIAGNOSIS — M79674 Pain in right toe(s): Secondary | ICD-10-CM | POA: Diagnosis not present

## 2022-07-03 DIAGNOSIS — I10 Essential (primary) hypertension: Secondary | ICD-10-CM | POA: Diagnosis not present

## 2022-07-03 DIAGNOSIS — M79675 Pain in left toe(s): Secondary | ICD-10-CM

## 2022-07-03 DIAGNOSIS — Z794 Long term (current) use of insulin: Secondary | ICD-10-CM

## 2022-07-03 DIAGNOSIS — E0822 Diabetes mellitus due to underlying condition with diabetic chronic kidney disease: Secondary | ICD-10-CM

## 2022-07-03 DIAGNOSIS — E785 Hyperlipidemia, unspecified: Secondary | ICD-10-CM

## 2022-07-03 DIAGNOSIS — N186 End stage renal disease: Secondary | ICD-10-CM | POA: Diagnosis not present

## 2022-07-03 DIAGNOSIS — B351 Tinea unguium: Secondary | ICD-10-CM

## 2022-07-03 LAB — HM DIABETES FOOT EXAM: HM Diabetic Foot Exam: ABNORMAL

## 2022-07-04 DIAGNOSIS — E1129 Type 2 diabetes mellitus with other diabetic kidney complication: Secondary | ICD-10-CM | POA: Diagnosis not present

## 2022-07-04 DIAGNOSIS — N186 End stage renal disease: Secondary | ICD-10-CM | POA: Diagnosis not present

## 2022-07-04 DIAGNOSIS — Z992 Dependence on renal dialysis: Secondary | ICD-10-CM | POA: Diagnosis not present

## 2022-07-04 DIAGNOSIS — T8249XA Other complication of vascular dialysis catheter, initial encounter: Secondary | ICD-10-CM | POA: Diagnosis not present

## 2022-07-04 DIAGNOSIS — N2581 Secondary hyperparathyroidism of renal origin: Secondary | ICD-10-CM | POA: Diagnosis not present

## 2022-07-04 DIAGNOSIS — R519 Headache, unspecified: Secondary | ICD-10-CM | POA: Diagnosis not present

## 2022-07-04 DIAGNOSIS — D631 Anemia in chronic kidney disease: Secondary | ICD-10-CM | POA: Diagnosis not present

## 2022-07-04 DIAGNOSIS — D688 Other specified coagulation defects: Secondary | ICD-10-CM | POA: Diagnosis not present

## 2022-07-06 DIAGNOSIS — D688 Other specified coagulation defects: Secondary | ICD-10-CM | POA: Diagnosis not present

## 2022-07-06 DIAGNOSIS — Z992 Dependence on renal dialysis: Secondary | ICD-10-CM | POA: Diagnosis not present

## 2022-07-06 DIAGNOSIS — N2581 Secondary hyperparathyroidism of renal origin: Secondary | ICD-10-CM | POA: Diagnosis not present

## 2022-07-06 DIAGNOSIS — T8249XA Other complication of vascular dialysis catheter, initial encounter: Secondary | ICD-10-CM | POA: Diagnosis not present

## 2022-07-06 DIAGNOSIS — N186 End stage renal disease: Secondary | ICD-10-CM | POA: Diagnosis not present

## 2022-07-06 DIAGNOSIS — D631 Anemia in chronic kidney disease: Secondary | ICD-10-CM | POA: Diagnosis not present

## 2022-07-06 DIAGNOSIS — R519 Headache, unspecified: Secondary | ICD-10-CM | POA: Diagnosis not present

## 2022-07-06 DIAGNOSIS — E1129 Type 2 diabetes mellitus with other diabetic kidney complication: Secondary | ICD-10-CM | POA: Diagnosis not present

## 2022-07-08 DIAGNOSIS — R519 Headache, unspecified: Secondary | ICD-10-CM | POA: Diagnosis not present

## 2022-07-08 DIAGNOSIS — Z992 Dependence on renal dialysis: Secondary | ICD-10-CM | POA: Diagnosis not present

## 2022-07-08 DIAGNOSIS — D631 Anemia in chronic kidney disease: Secondary | ICD-10-CM | POA: Diagnosis not present

## 2022-07-08 DIAGNOSIS — D688 Other specified coagulation defects: Secondary | ICD-10-CM | POA: Diagnosis not present

## 2022-07-08 DIAGNOSIS — N2581 Secondary hyperparathyroidism of renal origin: Secondary | ICD-10-CM | POA: Diagnosis not present

## 2022-07-08 DIAGNOSIS — E1129 Type 2 diabetes mellitus with other diabetic kidney complication: Secondary | ICD-10-CM | POA: Diagnosis not present

## 2022-07-08 DIAGNOSIS — T8249XA Other complication of vascular dialysis catheter, initial encounter: Secondary | ICD-10-CM | POA: Diagnosis not present

## 2022-07-08 DIAGNOSIS — N186 End stage renal disease: Secondary | ICD-10-CM | POA: Diagnosis not present

## 2022-07-09 NOTE — Progress Notes (Signed)
  Subjective:  Patient ID: Katie Garcia, female    DOB: 1941/03/23,  MRN: [id number]  Katie Garcia presents to clinic today for at risk foot care with h/o NIDDM with ESRD on hemodialysis and callus(es) b/l lower extremities and painful thick toenails that are difficult to trim. Painful toenails interfere with ambulation. Aggravating factors include wearing enclosed shoe gear. Pain is relieved with periodic professional debridement. Painful calluses are aggravated when weightbearing with and without shoegear. Pain is relieved with periodic professional debridement.  Patient states blood glucose was 181 mg/dl today.  Last A1c was about 8.0%.  New problem(s): None.   PCP is [name], MD , and last visit was  May 12, 2022  Allergies  Allergen Reactions   Penicillins Rash    Did it involve swelling of the face/tongue/throat, SOB, or low BP? No Did it involve sudden or severe rash/hives, skin peeling, or any reaction on the inside of your mouth or nose? Yes Did you need to seek medical attention at a hospital or doctor's office? N/A When did it last happen? N/A      If all above answers are "NO", may proceed with cephalosporin use.    Review of Systems: Negative except as noted in the HPI.  Objective: No changes noted in today's physical examination.  Constitutional Katie Garcia is a pleasant 81 y.o. African American female, obese in NAD. AAO x 3.   Vascular CFT <3 seconds b/l LE. Palpable DP pulse(s) b/l LE. Faintly palpable PT pulse(s) b/l LE. Pedal hair absent. No pain with calf compression b/l. Trace edema noted BLE.  Neurologic Normal speech. Oriented to person, place, and time. Protective sensation intact 5/5 intact bilaterally with 10g monofilament b/l. Vibratory sensation intact b/l.  Dermatologic Pedal integument with normal turgor, texture and tone b/l LE. No open wounds b/l. No interdigital macerations b/l. Toenails 1-5 b/l elongated, thickened, discolored with  subungual debris. +Tenderness with dorsal palpation of nailplates. Hyperkeratotic lesion(s) noted submet head 5 b/l.  Orthopedic: Normal muscle strength 5/5 to all lower extremity muscle groups bilaterally. Pes planus deformity noted bilateral LE.Marland KitchenNo pain, crepitus or joint limitation noted with ROM b/l LE.  Patient ambulates independently without assistive aids.   Radiographs: None    Latest Ref Rng & Units 03/09/2022   10:30 AM 12/09/2021   10:13 AM  Hemoglobin A1C  Hemoglobin-A1c 4.0 - 5.6 % 8.9  7.7    Assessment/Plan: 1. Pain due to onychomycosis of toenails of both feet   2. Callus   3. Essential hypertension   4. Hyperlipidemia, unspecified hyperlipidemia type   5. Diabetes mellitus due to underlying condition with chronic kidney disease on chronic dialysis, with long-term current use of insulin ([place])     -Patient was evaluated and treated. All patient's and/or POA's questions/concerns answered on today's visit. -Patient flagged in [place] for Limb at Risk. Ordered noninvasive arterial studies ABIs with and without TBIs for b/l lower extremities. -Patient recently discharged from hospital for AV fistula of LUE. -Mycotic toenails 1-5 bilaterally were debrided in length and girth with sterile nail nippers and dremel without incident. -Callus(es) submet head 5 b/l pared utilizing sterile scalpel blade without complication or incident. Total number debrided =2. -Patient/POA to call should there be question/concern in the interim.   Return in about 3 months (around 10/03/2022).  Marzetta Board, DPM

## 2022-07-11 DIAGNOSIS — N186 End stage renal disease: Secondary | ICD-10-CM | POA: Diagnosis not present

## 2022-07-11 DIAGNOSIS — D631 Anemia in chronic kidney disease: Secondary | ICD-10-CM | POA: Diagnosis not present

## 2022-07-11 DIAGNOSIS — T8249XA Other complication of vascular dialysis catheter, initial encounter: Secondary | ICD-10-CM | POA: Diagnosis not present

## 2022-07-11 DIAGNOSIS — E1129 Type 2 diabetes mellitus with other diabetic kidney complication: Secondary | ICD-10-CM | POA: Diagnosis not present

## 2022-07-11 DIAGNOSIS — Z992 Dependence on renal dialysis: Secondary | ICD-10-CM | POA: Diagnosis not present

## 2022-07-11 DIAGNOSIS — N2581 Secondary hyperparathyroidism of renal origin: Secondary | ICD-10-CM | POA: Diagnosis not present

## 2022-07-11 DIAGNOSIS — R519 Headache, unspecified: Secondary | ICD-10-CM | POA: Diagnosis not present

## 2022-07-11 DIAGNOSIS — D688 Other specified coagulation defects: Secondary | ICD-10-CM | POA: Diagnosis not present

## 2022-07-12 ENCOUNTER — Ambulatory Visit (HOSPITAL_COMMUNITY)
Admission: RE | Admit: 2022-07-12 | Discharge: 2022-07-12 | Disposition: A | Discharge status: Home / Self Care | Payer: Medicare Other | Source: Ambulatory Visit | Attending: Cardiovascular Disease | Admitting: Cardiovascular Disease

## 2022-07-12 DIAGNOSIS — Z794 Long term (current) use of insulin: Secondary | ICD-10-CM | POA: Insufficient documentation

## 2022-07-12 DIAGNOSIS — M79604 Pain in right leg: Secondary | ICD-10-CM

## 2022-07-12 DIAGNOSIS — E785 Hyperlipidemia, unspecified: Secondary | ICD-10-CM | POA: Insufficient documentation

## 2022-07-12 DIAGNOSIS — I10 Essential (primary) hypertension: Secondary | ICD-10-CM | POA: Diagnosis not present

## 2022-07-12 DIAGNOSIS — M79605 Pain in left leg: Secondary | ICD-10-CM | POA: Diagnosis not present

## 2022-07-12 DIAGNOSIS — E0822 Diabetes mellitus due to underlying condition with diabetic chronic kidney disease: Secondary | ICD-10-CM | POA: Diagnosis not present

## 2022-07-12 DIAGNOSIS — Z992 Dependence on renal dialysis: Secondary | ICD-10-CM | POA: Diagnosis not present

## 2022-07-12 DIAGNOSIS — N186 End stage renal disease: Secondary | ICD-10-CM | POA: Insufficient documentation

## 2022-07-13 DIAGNOSIS — N2581 Secondary hyperparathyroidism of renal origin: Secondary | ICD-10-CM | POA: Diagnosis not present

## 2022-07-13 DIAGNOSIS — D688 Other specified coagulation defects: Secondary | ICD-10-CM | POA: Diagnosis not present

## 2022-07-13 DIAGNOSIS — Z992 Dependence on renal dialysis: Secondary | ICD-10-CM | POA: Diagnosis not present

## 2022-07-13 DIAGNOSIS — T8249XA Other complication of vascular dialysis catheter, initial encounter: Secondary | ICD-10-CM | POA: Diagnosis not present

## 2022-07-13 DIAGNOSIS — E1129 Type 2 diabetes mellitus with other diabetic kidney complication: Secondary | ICD-10-CM | POA: Diagnosis not present

## 2022-07-13 DIAGNOSIS — N186 End stage renal disease: Secondary | ICD-10-CM | POA: Diagnosis not present

## 2022-07-13 DIAGNOSIS — D631 Anemia in chronic kidney disease: Secondary | ICD-10-CM | POA: Diagnosis not present

## 2022-07-13 DIAGNOSIS — R519 Headache, unspecified: Secondary | ICD-10-CM | POA: Diagnosis not present

## 2022-07-14 ENCOUNTER — Telehealth (INDEPENDENT_AMBULATORY_CARE_PROVIDER_SITE_OTHER): Payer: Medicare Other | Admitting: Podiatry

## 2022-07-14 NOTE — Telephone Encounter (Signed)
Regarding abnormal ABI test results. Patient informed of results. Currently has no open wounds, rest pain or claudication symptoms. If she should become symptomatic, she has established relationship with Dr. [name] in Vascular Surgery and we would have her see Vascular Surgery. Patient related understanding.

## 2022-07-15 ENCOUNTER — Other Ambulatory Visit: Payer: Self-pay

## 2022-07-15 DIAGNOSIS — N2581 Secondary hyperparathyroidism of renal origin: Secondary | ICD-10-CM | POA: Diagnosis not present

## 2022-07-15 DIAGNOSIS — D631 Anemia in chronic kidney disease: Secondary | ICD-10-CM | POA: Diagnosis not present

## 2022-07-15 DIAGNOSIS — T8249XA Other complication of vascular dialysis catheter, initial encounter: Secondary | ICD-10-CM | POA: Diagnosis not present

## 2022-07-15 DIAGNOSIS — N186 End stage renal disease: Secondary | ICD-10-CM

## 2022-07-15 DIAGNOSIS — R519 Headache, unspecified: Secondary | ICD-10-CM | POA: Diagnosis not present

## 2022-07-15 DIAGNOSIS — D688 Other specified coagulation defects: Secondary | ICD-10-CM | POA: Diagnosis not present

## 2022-07-15 DIAGNOSIS — E1129 Type 2 diabetes mellitus with other diabetic kidney complication: Secondary | ICD-10-CM | POA: Diagnosis not present

## 2022-07-15 DIAGNOSIS — Z992 Dependence on renal dialysis: Secondary | ICD-10-CM | POA: Diagnosis not present

## 2022-07-16 ENCOUNTER — Other Ambulatory Visit: Payer: Self-pay | Admitting: Endocrinology

## 2022-07-16 DIAGNOSIS — E1151 Type 2 diabetes mellitus with diabetic peripheral angiopathy without gangrene: Secondary | ICD-10-CM

## 2022-07-17 ENCOUNTER — Other Ambulatory Visit (INDEPENDENT_AMBULATORY_CARE_PROVIDER_SITE_OTHER): Payer: Medicare Other

## 2022-07-17 DIAGNOSIS — E1151 Type 2 diabetes mellitus with diabetic peripheral angiopathy without gangrene: Secondary | ICD-10-CM

## 2022-07-17 LAB — LIPID PANEL
Cholesterol: 156 mg/dL (ref 0–200)
HDL: 59.6 mg/dL (ref >39.00)
LDL Cholesterol: 83 mg/dL (ref 0–99)
NonHDL: 96.47
Total CHOL/HDL Ratio: 3
Triglycerides: 65 mg/dL (ref 0.0–149.0)
VLDL: 13 mg/dL (ref 0.0–40.0)

## 2022-07-17 LAB — HEMOGLOBIN A1C: Hgb A1c MFr Bld: 7.7 % — ABNORMAL HIGH (ref 4.6–6.5)

## 2022-07-17 LAB — GLUCOSE, RANDOM: Glucose, Bld: 192 mg/dL — ABNORMAL HIGH (ref 70–99)

## 2022-07-18 DIAGNOSIS — T8249XA Other complication of vascular dialysis catheter, initial encounter: Secondary | ICD-10-CM | POA: Diagnosis not present

## 2022-07-18 DIAGNOSIS — N186 End stage renal disease: Secondary | ICD-10-CM | POA: Diagnosis not present

## 2022-07-18 DIAGNOSIS — E1129 Type 2 diabetes mellitus with other diabetic kidney complication: Secondary | ICD-10-CM | POA: Diagnosis not present

## 2022-07-18 DIAGNOSIS — R519 Headache, unspecified: Secondary | ICD-10-CM | POA: Diagnosis not present

## 2022-07-18 DIAGNOSIS — N2581 Secondary hyperparathyroidism of renal origin: Secondary | ICD-10-CM | POA: Diagnosis not present

## 2022-07-18 DIAGNOSIS — D631 Anemia in chronic kidney disease: Secondary | ICD-10-CM | POA: Diagnosis not present

## 2022-07-18 DIAGNOSIS — D688 Other specified coagulation defects: Secondary | ICD-10-CM | POA: Diagnosis not present

## 2022-07-18 DIAGNOSIS — Z992 Dependence on renal dialysis: Secondary | ICD-10-CM | POA: Diagnosis not present

## 2022-07-19 ENCOUNTER — Ambulatory Visit: Payer: Self-pay

## 2022-07-19 ENCOUNTER — Ambulatory Visit (INDEPENDENT_AMBULATORY_CARE_PROVIDER_SITE_OTHER): Payer: Medicare Other | Admitting: Endocrinology

## 2022-07-19 ENCOUNTER — Encounter: Payer: Self-pay | Admitting: Endocrinology

## 2022-07-19 VITALS — BP 102/70 | HR 87 | Ht 60.0 in | Wt 193.2 lb

## 2022-07-19 DIAGNOSIS — Z794 Long term (current) use of insulin: Secondary | ICD-10-CM

## 2022-07-19 DIAGNOSIS — E7800 Pure hypercholesterolemia, unspecified: Secondary | ICD-10-CM | POA: Diagnosis not present

## 2022-07-19 DIAGNOSIS — E1165 Type 2 diabetes mellitus with hyperglycemia: Secondary | ICD-10-CM | POA: Diagnosis not present

## 2022-07-19 NOTE — Progress Notes (Signed)
Patient ID: Katie Garcia, female   DOB: [birth year], 81 y.o.   MRN: [id number]           Reason for Appointment: Type II Diabetes follow-up   History of Present Illness   Diagnosis date: 1980  Previous history:  Non-insulin hypoglycemic drugs previously used: Unknown Insulin was started soon after diagnosis, previously had been on Levemir, Tresiba, Lantus and NovoLog  A1c range in the last few years is: 7.1-11.1  Recent history:         Insulin regimen: 15 N, 15 R           Side effects from medications: None  Current self management, blood sugar patterns and problems identified:  A1c is 7.7 compared to 8.9 previously Currently she is only taking insulin once a day before her first meal which is different depending on whether she is doing dialysis in the mornings She usually takes insulin just before eating with both doses of insulin at the same time Unclear how often she checks her blood sugars as she did not bring a monitor She likely has been on NPH and regular insulin and not other insulins because of cost She says that depending on how much she is eating she may occasionally have a low sugar symptom before dinnertime However on the days of dialysis she only is eating lunch and dinner and skipping breakfast and will take her insulin when she is back from dialysis She is not aware of continuous glucose monitoring, she has some difficulty with fingersticks especially with boluses of her left hand Also generally not able to do much physical activity for exercise She has not seen a dietitian in a few years and is not following any particular meal plan   Diet management: Usually avoiding sweets and drinks with sugar  Breakfast at 9 am, lunch 2-3 p.m., dinner 6-7 pm     Hypoglycemia: Occasionally at 5-6pm   Glucometer: Accu-Chek  Blood Glucose readings from recall  PRE-MEAL Fasting Lunch Dinner Bedtime Overall  Glucose range: About 170   About 200   Mean/median:          Dietician visit: Most recent:  2019    Weight control:  Wt Readings from Last 3 Encounters:  07/19/22 193 lb 3.2 oz (87.6 kg)  06/23/22 195 lb (88.5 kg)  05/10/22 193 lb (87.5 kg)         Diabetes labs:  Lab Results  Component Value Date   HGBA1C 7.7 (H) 07/17/2022   HGBA1C 8.9 (A) 03/09/2022   HGBA1C 7.7 (A) 12/09/2021   Lab Results  Component Value Date   LDLCALC 83 07/17/2022   CREATININE 7.10 (H) 06/23/2022    Lab Results  Component Value Date   FRUCTOSAMINE 408 (H) 08/29/2019   Other active problems addressed today: See review of systems  Allergies as of 07/19/2022       Reactions   Penicillins Rash   Did it involve swelling of the face/tongue/throat, SOB, or low BP? No Did it involve sudden or severe rash/hives, skin peeling, or any reaction on the inside of your mouth or nose? Yes Did you need to seek medical attention at a hospital or doctor's office? N/A When did it last happen? N/A      If all above answers are "NO", may proceed with cephalosporin use.        Medication List        Accurate as of July 19, 2022 11:47 AM. If you have any  questions, ask your nurse or doctor.          Accu-Chek FastClix Lancets Misc   Accu-Chek Guide test strip Generic drug: glucose blood 1 each by Other route in the morning and at bedtime. E11.22   Accu-Chek Guide w/Device Kit   acetaminophen 500 MG tablet Commonly known as: TYLENOL Take 500-1,000 mg by mouth every 6 (six) hours as needed for moderate pain or headache.   allopurinol 100 MG tablet Commonly known as: ZYLOPRIM Take 100 mg by mouth Every Tuesday,Thursday,and Saturday with dialysis. After hemodialysis   calcium acetate 667 MG capsule Commonly known as: PHOSLO Take 1,334 mg by mouth 3 (three) times daily with meals.   carvedilol 25 MG tablet Commonly known as: COREG Take 25 mg by mouth in the morning and at bedtime.   Dialyvite 800-Zinc 15 0.8 MG Tabs Take 1 tablet by mouth daily  with supper.   famotidine 20 MG tablet Commonly known as: PEPCID Take 20 mg by mouth daily.   guaifenesin 100 MG/5ML syrup Commonly known as: ROBITUSSIN Take 100 mg by mouth 3 (three) times daily as needed for cough.   insulin NPH Human 100 UNIT/ML injection Commonly known as: NovoLIN N Inject 0.15 mLs (15 Units total) into the skin daily before breakfast.   insulin regular 100 units/mL injection Commonly known as: NovoLIN R Inject 0.15 mLs (15 Units total) into the skin daily. And syringes 1/day   iron sucrose in sodium chloride 0.9 % 100 mL Iron Sucrose (Venofer)   loratadine 10 MG tablet Commonly known as: CLARITIN Take 10 mg by mouth daily.   MIRCERA IJ Mircera   oxyCODONE-acetaminophen 5-325 MG tablet Commonly known as: Percocet Take 1 tablet by mouth every 6 (six) hours as needed for severe pain.   RELION INSULIN SYR 0.5ML/31G 31G X 5/16" 0.5 ML Misc Generic drug: Insulin Syringe-Needle U-100 USE 1 ONCE DAILY   rosuvastatin 10 MG tablet Commonly known as: CRESTOR Take 10 mg by mouth in the morning.        Allergies:  Allergies  Allergen Reactions   Penicillins Rash    Did it involve swelling of the face/tongue/throat, SOB, or low BP? No Did it involve sudden or severe rash/hives, skin peeling, or any reaction on the inside of your mouth or nose? Yes Did you need to seek medical attention at a hospital or doctor's office? N/A When did it last happen? N/A      If all above answers are "NO", may proceed with cephalosporin use.    Past Medical History:  Diagnosis Date   Anemia    Arthritis    COVID    hospitalized   Diabetes mellitus without complication ([place])    ESRD    Dialysis T/Th/Sa at [facility]   GERD (gastroesophageal reflux disease)    Gout    Headache    Hypertension    Type 2 diabetes mellitus [facility])     Past Surgical History:  Procedure Laterality Date   A/V FISTULAGRAM Left 04/05/2022   Procedure: A/V Fistulagram;  Surgeon:  Broadus John, MD;  Location: [place] [facility];  Service: Cardiovascular;  Laterality: Left;   ABDOMINAL HYSTERECTOMY     AV FISTULA PLACEMENT Left 10/02/2016   Procedure: ARTERIOVENOUS (AV) FISTULA CREATION LEFT UPPER ARM;  Surgeon: Waynetta Sandy, MD;  Location: [place];  Service: Vascular;  Laterality: Left;   AV FISTULA PLACEMENT Right 07/14/2020   Procedure: RIGHT ARM ARTERIOVENOUS FISTULA CREATION;  Surgeon: Waynetta Sandy,  MD;  Location: [place];  Service: Vascular;  Laterality: Right;   AV FISTULA PLACEMENT Left 05/16/2021   Procedure: INSERTION OF LEFT UPPER ARM ARTERIOVENOUS (AV) GORE-TEX GRAFT;  Surgeon: Marty Heck, MD;  Location: [place];  Service: Vascular;  Laterality: Left;   AV FISTULA PLACEMENT Right 07/13/2021   Procedure: RIGHT ARM ARTERIOVENOUS (AV) FISTULA GRAFT INSERTION;  Surgeon: Waynetta Sandy, MD;  Location: [place];  Service: Vascular;  Laterality: Right;   AV FISTULA PLACEMENT Left 02/03/2022   Procedure: LEFT UPPER ARM ARTERIOVENOUS FISTULA CREATION;  Surgeon: Waynetta Sandy, MD;  Location: [place];  Service: Vascular;  Laterality: Left;   AV FISTULA PLACEMENT Left 06/23/2022   Procedure: INSERTION OF LEFT ARM ARTERIOVENOUS (AV) GORE-TEX GRAFT;  Surgeon: Waynetta Sandy, MD;  Location: [place];  Service: Vascular;  Laterality: Left;   [place] Left 05/17/2020   Procedure: Insertion of Left arm arteriovenous gortex graftarm ;  Surgeon: Angelia Mould, MD;  Location: [place];  Service: Vascular;  Laterality: Left;   CESAREAN SECTION     DIALYSIS/PERMA CATHETER INSERTION     IR FLUORO GUIDE CV LINE LEFT  02/09/2022   IR PTA VENOUS EXCEPT DIALYSIS CIRCUIT  02/09/2022   IR VENOCAVAGRAM SVC  02/09/2022   REVISON OF ARTERIOVENOUS FISTULA Right 09/15/2020   Procedure: REVISON OF RIGHT UPPER ARM  ARTERIOVENOUS FISTULA;  Surgeon: Waynetta Sandy, MD;  Location: [place];  Service: Vascular;   Laterality: Right;   UPPER EXTREMITY VENOGRAPHY Bilateral 04/18/2021   Procedure: UPPER EXTREMITY VENOGRAPHY;  Surgeon: Waynetta Sandy, MD;  Location: [place] [facility];  Service: Cardiovascular;  Laterality: Bilateral;   UPPER EXTREMITY VENOGRAPHY Left 01/23/2022   Procedure: UPPER EXTREMITY VENOGRAPHY;  Surgeon: Waynetta Sandy, MD;  Location: [place] [facility];  Service: Cardiovascular;  Laterality: Left;    Family History  Problem Relation Age of Onset   Diabetes Mother     Social History:  reports that she has never smoked. She has never used smokeless tobacco. She reports that she does not currently use alcohol. She reports that she does not use drugs.  Review of Systems:  Last diabetic eye exam date [place], report not available  Last foot exam date: 7/23  Symptoms of neuropathy: None She has mild peripheral vascular disease  Hypertension:   Treatment includes carvedilol  BP Readings from Last 3 Encounters:  07/19/22 102/70  06/23/22 (!) 145/60  05/10/22 138/74    Lipid management: Treated with Crestor 32m     Lab Results  Component Value Date   CHOL 156 07/17/2022   Lab Results  Component Value Date   HDL 59.60 07/17/2022   Lab Results  Component Value Date   LDLCALC 83 07/17/2022   Lab Results  Component Value Date   TRIG 65.0 07/17/2022   Lab Results  Component Value Date   CHOLHDL 3 07/17/2022   No results found for: "LDLDIRECT"   Examination:   BP 102/70   Pulse 87   Ht 5' (1.524 m)   Wt 193 lb 3.2 oz (87.6 kg)   BMI 37.73 kg/m   Body mass index is 37.73 kg/m.    ASSESSMENT/ PLAN:    Diabetes type 2:   Current regimen: NPH and regular once a day only  See history of present illness for detailed discussion of current diabetes management, blood sugar patterns and problems identified  A1c is 7.7  Blood glucose control is usually not adequate and  difficult to assess adequately because of infrequent  monitoring and patient not bring her monitor for download today Also likely having consistently high readings after dinner and overnight from her history Obesity with BMI about 38 She may be able to benefit from a GLP-1 drug but would likely will not be able to afford it because of cost  Diabetic complications likely include end-stage renal disease, asymptomatic peripheral vascular disease and unknown status of retinopathy  HYPERLIPIDEMIA well-controlled with LDL 83  Recommendations:  She is a good candidate for using the continuous glucose monitoring Discussed the benefits of using CGM such as a freestyle libre and how this would be able to monitor her blood sugar more consistently at all times, alert her for high and low readings and essentially eliminate fingersticks which is difficult for her Discussed that this would be obtained from a DME supplier and will send prescription to the [facility] In the meantime we will add 6 units of NPH and regular at dinnertime to cover evening hyperglycemia Also to reduce chances of low sugars in the afternoon we will reduce her morning doses to 12 units each She will bring a monitor for download on each visit Consultation with diabetes educator as she needs review of her meal planning, timing of insulin, blood sugar patterns and further adjustment of insulin Recommended that she have annual eye exams and have report forwarded to [place]  There are no Patient Instructions on file for this visit.   Elayne Snare 07/19/2022, 11:47 AM

## 2022-07-19 NOTE — Patient Instructions (Addendum)
Visit Information  Thank you for allowing the Care Management team to participate in your care.   Please don't hesitate to contact me if you require assistance prior to our next outreach.  Following are the goals we discussed today:   Goals Addressed             This Visit's Progress    Obtain Continuous Monitoring Device       Care Coordination Interventions: Discussed A1C goal. Level has improved. Decreased from 8.9 to 7.7.  Reviewed medications with patient's daughter.  Discussed increase in insulin dose following Endocrinology visit earlier today Discussed nutritional intake. Report previously using meal supplement drinks. No longer required d/t improved intake. Reviewed blood glucose readings. Reports monitoring as advised. Reports fasting reading was 76 today. Reports the Endocrinology team is working to obtain a continuous monitoring device. Unsure if FreeStyle [place] or Dexcom will be ordered. Daughter agreed to contact the care management team for assistance as needed.           Our next appointment is by telephone on August 21, 2022 at 3 pm. Please call the care guide team at [phone] if you need to cancel or reschedule your appointment.   The patient and daughter Webb Silversmith verbalized understanding of the information discussed during the telephonic outreach. Declined need for mailed instructions or resources.   [facility] [phone]

## 2022-07-19 NOTE — Patient Instructions (Addendum)
Take 12 R and 12 N in am  START 6 N AND 6 R BEFORE SUPPER  Check blood sugars on waking up   Also check blood sugars about 2 hours after meals and do this after different meals by rotation  Recommended blood sugar levels on waking up are 90-130 and about 2 hours after meal is 130-160  Please bring your blood sugar monitor to each visit, thank you

## 2022-07-19 NOTE — Patient Outreach (Signed)
  Care Coordination   Initial Visit Note   07/19/2022 Name: Katie Garcia MRN: [id number] DOB: Jul 03, 1941  Katie Garcia is a 80 y.o. year old female who sees [name], MD for primary care. I spoke with  Katie Garcia and her daughter/caregiver Katie Garcia by phone today  What matters to the patients health and wellness today?  Obtain Continuous Monitoring Device  Goals Addressed             This Visit's Progress    Obtain Continuous Monitoring Device       Care Coordination Interventions: Discussed A1C goal. Level has improved. Decreased from 8.9 to 7.7.  Reviewed medications with patient's daughter.  Discussed increase in insulin dose following Endocrinology visit earlier today Discussed nutritional intake. Report previously using meal supplement drinks. No longer required d/t improved intake. Reviewed blood glucose readings. Reports monitoring as advised. Reports fasting reading was 76 today. Reports the Endocrinology team is working to obtain a continuous monitoring device. Unsure if FreeStyle [place] or Dexcom will be ordered. Daughter agreed to contact the care management team for assistance as needed.           SDOH assessments and interventions completed:   Yes SDOH Interventions Today    Flowsheet Row Most Recent Value  SDOH Interventions   Food Insecurity Interventions Intervention Not Indicated  Transportation Interventions Intervention Not Indicated       Care Coordination Interventions Activated:  Yes Care Coordination Interventions:  Yes, provided  Follow up plan: Follow up call scheduled for August 21, 2022.  Encounter Outcome:  Pt. Visit Completed  [place] [phone]

## 2022-07-20 DIAGNOSIS — R519 Headache, unspecified: Secondary | ICD-10-CM | POA: Diagnosis not present

## 2022-07-20 DIAGNOSIS — D688 Other specified coagulation defects: Secondary | ICD-10-CM | POA: Diagnosis not present

## 2022-07-20 DIAGNOSIS — N186 End stage renal disease: Secondary | ICD-10-CM | POA: Diagnosis not present

## 2022-07-20 DIAGNOSIS — T8249XA Other complication of vascular dialysis catheter, initial encounter: Secondary | ICD-10-CM | POA: Diagnosis not present

## 2022-07-20 DIAGNOSIS — D631 Anemia in chronic kidney disease: Secondary | ICD-10-CM | POA: Diagnosis not present

## 2022-07-20 DIAGNOSIS — N2581 Secondary hyperparathyroidism of renal origin: Secondary | ICD-10-CM | POA: Diagnosis not present

## 2022-07-20 DIAGNOSIS — E1129 Type 2 diabetes mellitus with other diabetic kidney complication: Secondary | ICD-10-CM | POA: Diagnosis not present

## 2022-07-20 DIAGNOSIS — Z992 Dependence on renal dialysis: Secondary | ICD-10-CM | POA: Diagnosis not present

## 2022-07-22 DIAGNOSIS — R519 Headache, unspecified: Secondary | ICD-10-CM | POA: Diagnosis not present

## 2022-07-22 DIAGNOSIS — Z992 Dependence on renal dialysis: Secondary | ICD-10-CM | POA: Diagnosis not present

## 2022-07-22 DIAGNOSIS — E1129 Type 2 diabetes mellitus with other diabetic kidney complication: Secondary | ICD-10-CM | POA: Diagnosis not present

## 2022-07-22 DIAGNOSIS — T8249XA Other complication of vascular dialysis catheter, initial encounter: Secondary | ICD-10-CM | POA: Diagnosis not present

## 2022-07-22 DIAGNOSIS — N186 End stage renal disease: Secondary | ICD-10-CM | POA: Diagnosis not present

## 2022-07-22 DIAGNOSIS — D688 Other specified coagulation defects: Secondary | ICD-10-CM | POA: Diagnosis not present

## 2022-07-22 DIAGNOSIS — N2581 Secondary hyperparathyroidism of renal origin: Secondary | ICD-10-CM | POA: Diagnosis not present

## 2022-07-22 DIAGNOSIS — D631 Anemia in chronic kidney disease: Secondary | ICD-10-CM | POA: Diagnosis not present

## 2022-07-24 DIAGNOSIS — N186 End stage renal disease: Secondary | ICD-10-CM | POA: Diagnosis not present

## 2022-07-24 DIAGNOSIS — Z992 Dependence on renal dialysis: Secondary | ICD-10-CM | POA: Diagnosis not present

## 2022-07-24 DIAGNOSIS — E1122 Type 2 diabetes mellitus with diabetic chronic kidney disease: Secondary | ICD-10-CM | POA: Diagnosis not present

## 2022-07-25 DIAGNOSIS — D631 Anemia in chronic kidney disease: Secondary | ICD-10-CM | POA: Diagnosis not present

## 2022-07-25 DIAGNOSIS — Z992 Dependence on renal dialysis: Secondary | ICD-10-CM | POA: Diagnosis not present

## 2022-07-25 DIAGNOSIS — R519 Headache, unspecified: Secondary | ICD-10-CM | POA: Diagnosis not present

## 2022-07-25 DIAGNOSIS — D688 Other specified coagulation defects: Secondary | ICD-10-CM | POA: Diagnosis not present

## 2022-07-25 DIAGNOSIS — N2581 Secondary hyperparathyroidism of renal origin: Secondary | ICD-10-CM | POA: Diagnosis not present

## 2022-07-25 DIAGNOSIS — T8249XA Other complication of vascular dialysis catheter, initial encounter: Secondary | ICD-10-CM | POA: Diagnosis not present

## 2022-07-25 DIAGNOSIS — N186 End stage renal disease: Secondary | ICD-10-CM | POA: Diagnosis not present

## 2022-07-25 DIAGNOSIS — E1129 Type 2 diabetes mellitus with other diabetic kidney complication: Secondary | ICD-10-CM | POA: Diagnosis not present

## 2022-07-27 DIAGNOSIS — D688 Other specified coagulation defects: Secondary | ICD-10-CM | POA: Diagnosis not present

## 2022-07-27 DIAGNOSIS — R519 Headache, unspecified: Secondary | ICD-10-CM | POA: Diagnosis not present

## 2022-07-27 DIAGNOSIS — D631 Anemia in chronic kidney disease: Secondary | ICD-10-CM | POA: Diagnosis not present

## 2022-07-27 DIAGNOSIS — N2581 Secondary hyperparathyroidism of renal origin: Secondary | ICD-10-CM | POA: Diagnosis not present

## 2022-07-27 DIAGNOSIS — T8249XA Other complication of vascular dialysis catheter, initial encounter: Secondary | ICD-10-CM | POA: Diagnosis not present

## 2022-07-27 DIAGNOSIS — Z992 Dependence on renal dialysis: Secondary | ICD-10-CM | POA: Diagnosis not present

## 2022-07-27 DIAGNOSIS — N186 End stage renal disease: Secondary | ICD-10-CM | POA: Diagnosis not present

## 2022-07-27 DIAGNOSIS — E1129 Type 2 diabetes mellitus with other diabetic kidney complication: Secondary | ICD-10-CM | POA: Diagnosis not present

## 2022-07-29 DIAGNOSIS — D688 Other specified coagulation defects: Secondary | ICD-10-CM | POA: Diagnosis not present

## 2022-07-29 DIAGNOSIS — T8249XA Other complication of vascular dialysis catheter, initial encounter: Secondary | ICD-10-CM | POA: Diagnosis not present

## 2022-07-29 DIAGNOSIS — D631 Anemia in chronic kidney disease: Secondary | ICD-10-CM | POA: Diagnosis not present

## 2022-07-29 DIAGNOSIS — E1129 Type 2 diabetes mellitus with other diabetic kidney complication: Secondary | ICD-10-CM | POA: Diagnosis not present

## 2022-07-29 DIAGNOSIS — Z992 Dependence on renal dialysis: Secondary | ICD-10-CM | POA: Diagnosis not present

## 2022-07-29 DIAGNOSIS — R519 Headache, unspecified: Secondary | ICD-10-CM | POA: Diagnosis not present

## 2022-07-29 DIAGNOSIS — N2581 Secondary hyperparathyroidism of renal origin: Secondary | ICD-10-CM | POA: Diagnosis not present

## 2022-07-29 DIAGNOSIS — N186 End stage renal disease: Secondary | ICD-10-CM | POA: Diagnosis not present

## 2022-08-01 DIAGNOSIS — Z992 Dependence on renal dialysis: Secondary | ICD-10-CM | POA: Diagnosis not present

## 2022-08-01 DIAGNOSIS — D631 Anemia in chronic kidney disease: Secondary | ICD-10-CM | POA: Diagnosis not present

## 2022-08-01 DIAGNOSIS — T8249XA Other complication of vascular dialysis catheter, initial encounter: Secondary | ICD-10-CM | POA: Diagnosis not present

## 2022-08-01 DIAGNOSIS — N186 End stage renal disease: Secondary | ICD-10-CM | POA: Diagnosis not present

## 2022-08-01 DIAGNOSIS — N2581 Secondary hyperparathyroidism of renal origin: Secondary | ICD-10-CM | POA: Diagnosis not present

## 2022-08-01 DIAGNOSIS — D688 Other specified coagulation defects: Secondary | ICD-10-CM | POA: Diagnosis not present

## 2022-08-01 DIAGNOSIS — R519 Headache, unspecified: Secondary | ICD-10-CM | POA: Diagnosis not present

## 2022-08-01 DIAGNOSIS — E1129 Type 2 diabetes mellitus with other diabetic kidney complication: Secondary | ICD-10-CM | POA: Diagnosis not present

## 2022-08-03 DIAGNOSIS — T8249XA Other complication of vascular dialysis catheter, initial encounter: Secondary | ICD-10-CM | POA: Diagnosis not present

## 2022-08-03 DIAGNOSIS — E1129 Type 2 diabetes mellitus with other diabetic kidney complication: Secondary | ICD-10-CM | POA: Diagnosis not present

## 2022-08-03 DIAGNOSIS — N2581 Secondary hyperparathyroidism of renal origin: Secondary | ICD-10-CM | POA: Diagnosis not present

## 2022-08-03 DIAGNOSIS — D631 Anemia in chronic kidney disease: Secondary | ICD-10-CM | POA: Diagnosis not present

## 2022-08-03 DIAGNOSIS — D688 Other specified coagulation defects: Secondary | ICD-10-CM | POA: Diagnosis not present

## 2022-08-03 DIAGNOSIS — Z992 Dependence on renal dialysis: Secondary | ICD-10-CM | POA: Diagnosis not present

## 2022-08-03 DIAGNOSIS — N186 End stage renal disease: Secondary | ICD-10-CM | POA: Diagnosis not present

## 2022-08-03 DIAGNOSIS — R519 Headache, unspecified: Secondary | ICD-10-CM | POA: Diagnosis not present

## 2022-08-05 DIAGNOSIS — D688 Other specified coagulation defects: Secondary | ICD-10-CM | POA: Diagnosis not present

## 2022-08-05 DIAGNOSIS — D631 Anemia in chronic kidney disease: Secondary | ICD-10-CM | POA: Diagnosis not present

## 2022-08-05 DIAGNOSIS — N2581 Secondary hyperparathyroidism of renal origin: Secondary | ICD-10-CM | POA: Diagnosis not present

## 2022-08-05 DIAGNOSIS — Z992 Dependence on renal dialysis: Secondary | ICD-10-CM | POA: Diagnosis not present

## 2022-08-05 DIAGNOSIS — N186 End stage renal disease: Secondary | ICD-10-CM | POA: Diagnosis not present

## 2022-08-05 DIAGNOSIS — E1129 Type 2 diabetes mellitus with other diabetic kidney complication: Secondary | ICD-10-CM | POA: Diagnosis not present

## 2022-08-05 DIAGNOSIS — T8249XA Other complication of vascular dialysis catheter, initial encounter: Secondary | ICD-10-CM | POA: Diagnosis not present

## 2022-08-05 DIAGNOSIS — R519 Headache, unspecified: Secondary | ICD-10-CM | POA: Diagnosis not present

## 2022-08-08 ENCOUNTER — Other Ambulatory Visit: Payer: Self-pay

## 2022-08-08 DIAGNOSIS — E1165 Type 2 diabetes mellitus with hyperglycemia: Secondary | ICD-10-CM

## 2022-08-08 DIAGNOSIS — N2581 Secondary hyperparathyroidism of renal origin: Secondary | ICD-10-CM | POA: Diagnosis not present

## 2022-08-08 DIAGNOSIS — T8249XA Other complication of vascular dialysis catheter, initial encounter: Secondary | ICD-10-CM | POA: Diagnosis not present

## 2022-08-08 DIAGNOSIS — N186 End stage renal disease: Secondary | ICD-10-CM | POA: Diagnosis not present

## 2022-08-08 DIAGNOSIS — D688 Other specified coagulation defects: Secondary | ICD-10-CM | POA: Diagnosis not present

## 2022-08-08 DIAGNOSIS — Z992 Dependence on renal dialysis: Secondary | ICD-10-CM | POA: Diagnosis not present

## 2022-08-08 DIAGNOSIS — R519 Headache, unspecified: Secondary | ICD-10-CM | POA: Diagnosis not present

## 2022-08-08 DIAGNOSIS — D631 Anemia in chronic kidney disease: Secondary | ICD-10-CM | POA: Diagnosis not present

## 2022-08-08 DIAGNOSIS — E1129 Type 2 diabetes mellitus with other diabetic kidney complication: Secondary | ICD-10-CM | POA: Diagnosis not present

## 2022-08-08 MED ORDER — "RELION INSULIN SYRINGE 31G X 5/16"" 0.5 ML MISC": 2 refills | Status: DC

## 2022-08-09 DIAGNOSIS — Z992 Dependence on renal dialysis: Secondary | ICD-10-CM | POA: Diagnosis not present

## 2022-08-09 DIAGNOSIS — Z452 Encounter for adjustment and management of vascular access device: Secondary | ICD-10-CM | POA: Diagnosis not present

## 2022-08-09 DIAGNOSIS — N186 End stage renal disease: Secondary | ICD-10-CM | POA: Diagnosis not present

## 2022-08-10 DIAGNOSIS — D631 Anemia in chronic kidney disease: Secondary | ICD-10-CM | POA: Diagnosis not present

## 2022-08-10 DIAGNOSIS — Z992 Dependence on renal dialysis: Secondary | ICD-10-CM | POA: Diagnosis not present

## 2022-08-10 DIAGNOSIS — N186 End stage renal disease: Secondary | ICD-10-CM | POA: Diagnosis not present

## 2022-08-10 DIAGNOSIS — E1129 Type 2 diabetes mellitus with other diabetic kidney complication: Secondary | ICD-10-CM | POA: Diagnosis not present

## 2022-08-10 DIAGNOSIS — N2581 Secondary hyperparathyroidism of renal origin: Secondary | ICD-10-CM | POA: Diagnosis not present

## 2022-08-10 DIAGNOSIS — T8249XA Other complication of vascular dialysis catheter, initial encounter: Secondary | ICD-10-CM | POA: Diagnosis not present

## 2022-08-10 DIAGNOSIS — R519 Headache, unspecified: Secondary | ICD-10-CM | POA: Diagnosis not present

## 2022-08-10 DIAGNOSIS — D688 Other specified coagulation defects: Secondary | ICD-10-CM | POA: Diagnosis not present

## 2022-08-11 ENCOUNTER — Other Ambulatory Visit: Payer: Self-pay | Admitting: *Deleted

## 2022-08-11 DIAGNOSIS — N186 End stage renal disease: Secondary | ICD-10-CM

## 2022-08-12 DIAGNOSIS — N2581 Secondary hyperparathyroidism of renal origin: Secondary | ICD-10-CM | POA: Diagnosis not present

## 2022-08-12 DIAGNOSIS — R519 Headache, unspecified: Secondary | ICD-10-CM | POA: Diagnosis not present

## 2022-08-12 DIAGNOSIS — E1129 Type 2 diabetes mellitus with other diabetic kidney complication: Secondary | ICD-10-CM | POA: Diagnosis not present

## 2022-08-12 DIAGNOSIS — Z992 Dependence on renal dialysis: Secondary | ICD-10-CM | POA: Diagnosis not present

## 2022-08-12 DIAGNOSIS — T8249XA Other complication of vascular dialysis catheter, initial encounter: Secondary | ICD-10-CM | POA: Diagnosis not present

## 2022-08-12 DIAGNOSIS — D688 Other specified coagulation defects: Secondary | ICD-10-CM | POA: Diagnosis not present

## 2022-08-12 DIAGNOSIS — D631 Anemia in chronic kidney disease: Secondary | ICD-10-CM | POA: Diagnosis not present

## 2022-08-12 DIAGNOSIS — N186 End stage renal disease: Secondary | ICD-10-CM | POA: Diagnosis not present

## 2022-08-15 DIAGNOSIS — T8249XA Other complication of vascular dialysis catheter, initial encounter: Secondary | ICD-10-CM | POA: Diagnosis not present

## 2022-08-15 DIAGNOSIS — D631 Anemia in chronic kidney disease: Secondary | ICD-10-CM | POA: Diagnosis not present

## 2022-08-15 DIAGNOSIS — R519 Headache, unspecified: Secondary | ICD-10-CM | POA: Diagnosis not present

## 2022-08-15 DIAGNOSIS — Z992 Dependence on renal dialysis: Secondary | ICD-10-CM | POA: Diagnosis not present

## 2022-08-15 DIAGNOSIS — E1129 Type 2 diabetes mellitus with other diabetic kidney complication: Secondary | ICD-10-CM | POA: Diagnosis not present

## 2022-08-15 DIAGNOSIS — D688 Other specified coagulation defects: Secondary | ICD-10-CM | POA: Diagnosis not present

## 2022-08-15 DIAGNOSIS — N2581 Secondary hyperparathyroidism of renal origin: Secondary | ICD-10-CM | POA: Diagnosis not present

## 2022-08-15 DIAGNOSIS — N186 End stage renal disease: Secondary | ICD-10-CM | POA: Diagnosis not present

## 2022-08-17 DIAGNOSIS — T8249XA Other complication of vascular dialysis catheter, initial encounter: Secondary | ICD-10-CM | POA: Diagnosis not present

## 2022-08-17 DIAGNOSIS — E1129 Type 2 diabetes mellitus with other diabetic kidney complication: Secondary | ICD-10-CM | POA: Diagnosis not present

## 2022-08-17 DIAGNOSIS — Z992 Dependence on renal dialysis: Secondary | ICD-10-CM | POA: Diagnosis not present

## 2022-08-17 DIAGNOSIS — D631 Anemia in chronic kidney disease: Secondary | ICD-10-CM | POA: Diagnosis not present

## 2022-08-17 DIAGNOSIS — D688 Other specified coagulation defects: Secondary | ICD-10-CM | POA: Diagnosis not present

## 2022-08-17 DIAGNOSIS — R519 Headache, unspecified: Secondary | ICD-10-CM | POA: Diagnosis not present

## 2022-08-17 DIAGNOSIS — N186 End stage renal disease: Secondary | ICD-10-CM | POA: Diagnosis not present

## 2022-08-17 DIAGNOSIS — N2581 Secondary hyperparathyroidism of renal origin: Secondary | ICD-10-CM | POA: Diagnosis not present

## 2022-08-19 DIAGNOSIS — D688 Other specified coagulation defects: Secondary | ICD-10-CM | POA: Diagnosis not present

## 2022-08-19 DIAGNOSIS — E1129 Type 2 diabetes mellitus with other diabetic kidney complication: Secondary | ICD-10-CM | POA: Diagnosis not present

## 2022-08-19 DIAGNOSIS — N186 End stage renal disease: Secondary | ICD-10-CM | POA: Diagnosis not present

## 2022-08-19 DIAGNOSIS — Z992 Dependence on renal dialysis: Secondary | ICD-10-CM | POA: Diagnosis not present

## 2022-08-19 DIAGNOSIS — T8249XA Other complication of vascular dialysis catheter, initial encounter: Secondary | ICD-10-CM | POA: Diagnosis not present

## 2022-08-19 DIAGNOSIS — D631 Anemia in chronic kidney disease: Secondary | ICD-10-CM | POA: Diagnosis not present

## 2022-08-19 DIAGNOSIS — N2581 Secondary hyperparathyroidism of renal origin: Secondary | ICD-10-CM | POA: Diagnosis not present

## 2022-08-19 DIAGNOSIS — R519 Headache, unspecified: Secondary | ICD-10-CM | POA: Diagnosis not present

## 2022-08-21 ENCOUNTER — Ambulatory Visit: Payer: Self-pay

## 2022-08-21 NOTE — Patient Outreach (Signed)
  Care Coordination   08/21/2022 Name: Zahirah Cheslock [place] MRN: [id number] DOB: Feb 27, 1941   Care Coordination Outreach Attempts:  An unsuccessful telephone outreach was attempted today to offer the patient information about available care coordination services as a benefit of their health plan.   Follow Up Plan:  Additional outreach attempts will be made to offer the patient care coordination information and services.   Encounter Outcome:  No Answer  Care Coordination Interventions Activated:  No   Care Coordination Interventions:  No, not indicated    [facility] [phone]

## 2022-08-22 DIAGNOSIS — T8249XA Other complication of vascular dialysis catheter, initial encounter: Secondary | ICD-10-CM | POA: Diagnosis not present

## 2022-08-22 DIAGNOSIS — E1129 Type 2 diabetes mellitus with other diabetic kidney complication: Secondary | ICD-10-CM | POA: Diagnosis not present

## 2022-08-22 DIAGNOSIS — N2581 Secondary hyperparathyroidism of renal origin: Secondary | ICD-10-CM | POA: Diagnosis not present

## 2022-08-22 DIAGNOSIS — D688 Other specified coagulation defects: Secondary | ICD-10-CM | POA: Diagnosis not present

## 2022-08-22 DIAGNOSIS — R519 Headache, unspecified: Secondary | ICD-10-CM | POA: Diagnosis not present

## 2022-08-22 DIAGNOSIS — D631 Anemia in chronic kidney disease: Secondary | ICD-10-CM | POA: Diagnosis not present

## 2022-08-22 DIAGNOSIS — Z992 Dependence on renal dialysis: Secondary | ICD-10-CM | POA: Diagnosis not present

## 2022-08-22 DIAGNOSIS — N186 End stage renal disease: Secondary | ICD-10-CM | POA: Diagnosis not present

## 2022-08-23 ENCOUNTER — Ambulatory Visit (HOSPITAL_COMMUNITY): Admission: RE | Admit: 2022-08-23 | Payer: Medicare Other | Source: Ambulatory Visit

## 2022-08-23 ENCOUNTER — Ambulatory Visit: Payer: Medicare Other

## 2022-08-24 DIAGNOSIS — E1122 Type 2 diabetes mellitus with diabetic chronic kidney disease: Secondary | ICD-10-CM | POA: Diagnosis not present

## 2022-08-24 DIAGNOSIS — Z992 Dependence on renal dialysis: Secondary | ICD-10-CM | POA: Diagnosis not present

## 2022-08-24 DIAGNOSIS — D688 Other specified coagulation defects: Secondary | ICD-10-CM | POA: Diagnosis not present

## 2022-08-24 DIAGNOSIS — N2581 Secondary hyperparathyroidism of renal origin: Secondary | ICD-10-CM | POA: Diagnosis not present

## 2022-08-24 DIAGNOSIS — R519 Headache, unspecified: Secondary | ICD-10-CM | POA: Diagnosis not present

## 2022-08-24 DIAGNOSIS — N186 End stage renal disease: Secondary | ICD-10-CM | POA: Diagnosis not present

## 2022-08-24 DIAGNOSIS — E1129 Type 2 diabetes mellitus with other diabetic kidney complication: Secondary | ICD-10-CM | POA: Diagnosis not present

## 2022-08-24 DIAGNOSIS — T8249XA Other complication of vascular dialysis catheter, initial encounter: Secondary | ICD-10-CM | POA: Diagnosis not present

## 2022-08-24 DIAGNOSIS — D631 Anemia in chronic kidney disease: Secondary | ICD-10-CM | POA: Diagnosis not present

## 2022-08-26 DIAGNOSIS — E1129 Type 2 diabetes mellitus with other diabetic kidney complication: Secondary | ICD-10-CM | POA: Diagnosis not present

## 2022-08-26 DIAGNOSIS — N186 End stage renal disease: Secondary | ICD-10-CM | POA: Diagnosis not present

## 2022-08-26 DIAGNOSIS — Z992 Dependence on renal dialysis: Secondary | ICD-10-CM | POA: Diagnosis not present

## 2022-08-26 DIAGNOSIS — D631 Anemia in chronic kidney disease: Secondary | ICD-10-CM | POA: Diagnosis not present

## 2022-08-26 DIAGNOSIS — R519 Headache, unspecified: Secondary | ICD-10-CM | POA: Diagnosis not present

## 2022-08-26 DIAGNOSIS — N2581 Secondary hyperparathyroidism of renal origin: Secondary | ICD-10-CM | POA: Diagnosis not present

## 2022-08-26 DIAGNOSIS — D688 Other specified coagulation defects: Secondary | ICD-10-CM | POA: Diagnosis not present

## 2022-08-29 DIAGNOSIS — Z992 Dependence on renal dialysis: Secondary | ICD-10-CM | POA: Diagnosis not present

## 2022-08-29 DIAGNOSIS — D631 Anemia in chronic kidney disease: Secondary | ICD-10-CM | POA: Diagnosis not present

## 2022-08-29 DIAGNOSIS — N186 End stage renal disease: Secondary | ICD-10-CM | POA: Diagnosis not present

## 2022-08-29 DIAGNOSIS — E1129 Type 2 diabetes mellitus with other diabetic kidney complication: Secondary | ICD-10-CM | POA: Diagnosis not present

## 2022-08-29 DIAGNOSIS — R519 Headache, unspecified: Secondary | ICD-10-CM | POA: Diagnosis not present

## 2022-08-29 DIAGNOSIS — D688 Other specified coagulation defects: Secondary | ICD-10-CM | POA: Diagnosis not present

## 2022-08-29 DIAGNOSIS — N2581 Secondary hyperparathyroidism of renal origin: Secondary | ICD-10-CM | POA: Diagnosis not present

## 2022-08-31 DIAGNOSIS — D688 Other specified coagulation defects: Secondary | ICD-10-CM | POA: Diagnosis not present

## 2022-08-31 DIAGNOSIS — N186 End stage renal disease: Secondary | ICD-10-CM | POA: Diagnosis not present

## 2022-08-31 DIAGNOSIS — R519 Headache, unspecified: Secondary | ICD-10-CM | POA: Diagnosis not present

## 2022-08-31 DIAGNOSIS — Z992 Dependence on renal dialysis: Secondary | ICD-10-CM | POA: Diagnosis not present

## 2022-08-31 DIAGNOSIS — E1129 Type 2 diabetes mellitus with other diabetic kidney complication: Secondary | ICD-10-CM | POA: Diagnosis not present

## 2022-08-31 DIAGNOSIS — D631 Anemia in chronic kidney disease: Secondary | ICD-10-CM | POA: Diagnosis not present

## 2022-08-31 DIAGNOSIS — N2581 Secondary hyperparathyroidism of renal origin: Secondary | ICD-10-CM | POA: Diagnosis not present

## 2022-09-02 DIAGNOSIS — D688 Other specified coagulation defects: Secondary | ICD-10-CM | POA: Diagnosis not present

## 2022-09-02 DIAGNOSIS — N186 End stage renal disease: Secondary | ICD-10-CM | POA: Diagnosis not present

## 2022-09-02 DIAGNOSIS — Z992 Dependence on renal dialysis: Secondary | ICD-10-CM | POA: Diagnosis not present

## 2022-09-02 DIAGNOSIS — D631 Anemia in chronic kidney disease: Secondary | ICD-10-CM | POA: Diagnosis not present

## 2022-09-02 DIAGNOSIS — E1129 Type 2 diabetes mellitus with other diabetic kidney complication: Secondary | ICD-10-CM | POA: Diagnosis not present

## 2022-09-02 DIAGNOSIS — R519 Headache, unspecified: Secondary | ICD-10-CM | POA: Diagnosis not present

## 2022-09-02 DIAGNOSIS — N2581 Secondary hyperparathyroidism of renal origin: Secondary | ICD-10-CM | POA: Diagnosis not present

## 2022-09-05 DIAGNOSIS — N186 End stage renal disease: Secondary | ICD-10-CM | POA: Diagnosis not present

## 2022-09-05 DIAGNOSIS — E1129 Type 2 diabetes mellitus with other diabetic kidney complication: Secondary | ICD-10-CM | POA: Diagnosis not present

## 2022-09-05 DIAGNOSIS — R519 Headache, unspecified: Secondary | ICD-10-CM | POA: Diagnosis not present

## 2022-09-05 DIAGNOSIS — D688 Other specified coagulation defects: Secondary | ICD-10-CM | POA: Diagnosis not present

## 2022-09-05 DIAGNOSIS — D631 Anemia in chronic kidney disease: Secondary | ICD-10-CM | POA: Diagnosis not present

## 2022-09-05 DIAGNOSIS — N2581 Secondary hyperparathyroidism of renal origin: Secondary | ICD-10-CM | POA: Diagnosis not present

## 2022-09-05 DIAGNOSIS — Z992 Dependence on renal dialysis: Secondary | ICD-10-CM | POA: Diagnosis not present

## 2022-09-07 DIAGNOSIS — E1129 Type 2 diabetes mellitus with other diabetic kidney complication: Secondary | ICD-10-CM | POA: Diagnosis not present

## 2022-09-07 DIAGNOSIS — R519 Headache, unspecified: Secondary | ICD-10-CM | POA: Diagnosis not present

## 2022-09-07 DIAGNOSIS — D631 Anemia in chronic kidney disease: Secondary | ICD-10-CM | POA: Diagnosis not present

## 2022-09-07 DIAGNOSIS — D688 Other specified coagulation defects: Secondary | ICD-10-CM | POA: Diagnosis not present

## 2022-09-07 DIAGNOSIS — Z992 Dependence on renal dialysis: Secondary | ICD-10-CM | POA: Diagnosis not present

## 2022-09-07 DIAGNOSIS — N186 End stage renal disease: Secondary | ICD-10-CM | POA: Diagnosis not present

## 2022-09-07 DIAGNOSIS — N2581 Secondary hyperparathyroidism of renal origin: Secondary | ICD-10-CM | POA: Diagnosis not present

## 2022-09-09 DIAGNOSIS — D631 Anemia in chronic kidney disease: Secondary | ICD-10-CM | POA: Diagnosis not present

## 2022-09-09 DIAGNOSIS — N2581 Secondary hyperparathyroidism of renal origin: Secondary | ICD-10-CM | POA: Diagnosis not present

## 2022-09-09 DIAGNOSIS — D688 Other specified coagulation defects: Secondary | ICD-10-CM | POA: Diagnosis not present

## 2022-09-09 DIAGNOSIS — N186 End stage renal disease: Secondary | ICD-10-CM | POA: Diagnosis not present

## 2022-09-09 DIAGNOSIS — E1129 Type 2 diabetes mellitus with other diabetic kidney complication: Secondary | ICD-10-CM | POA: Diagnosis not present

## 2022-09-09 DIAGNOSIS — Z992 Dependence on renal dialysis: Secondary | ICD-10-CM | POA: Diagnosis not present

## 2022-09-09 DIAGNOSIS — R519 Headache, unspecified: Secondary | ICD-10-CM | POA: Diagnosis not present

## 2022-09-11 ENCOUNTER — Encounter: Payer: Medicare Other | Attending: Internal Medicine | Admitting: Nutrition

## 2022-09-11 DIAGNOSIS — E1151 Type 2 diabetes mellitus with diabetic peripheral angiopathy without gangrene: Secondary | ICD-10-CM | POA: Diagnosis not present

## 2022-09-11 DIAGNOSIS — Z794 Long term (current) use of insulin: Secondary | ICD-10-CM | POA: Insufficient documentation

## 2022-09-11 MED ORDER — DEXCOM G7 SENSOR MISC: 2 refills | Status: DC

## 2022-09-11 NOTE — Addendum Note (Signed)
Addended by: Cinda Quest on: 09/11/2022 12:42 PM   Modules accepted: Orders

## 2022-09-11 NOTE — Patient Instructions (Signed)
Change sensor every 10 days Read over manual  call the 800 help line if questions or if sensor falls off Bring reader to office at every visit.

## 2022-09-11 NOTE — Progress Notes (Signed)
Patient is here with her daughter who is wearing the Dexcom G6.  Mother is now wanting this.  We discussed the difference between sensor readings and blood sugar readings, and they reported good understanding of this. Both patient and daughter were trained on the use of the Dexcom G7 sensor insertion.  She does not have a phone, so readings are going to the reader.  This was set with date/time and alarms:  Low : 70, high: 250.   She was show how to insert and start the sensor, as well as how/when to remove this and proper placement of the sensor.  They had no final questions.

## 2022-09-12 NOTE — Progress Notes (Unsigned)
Established Dialysis Access   History of Present Illness   Katie Garcia is a 81 y.o. (March 20, 1941) female who presents for evaluation of decreased radial pulse and numbness following Left arm brachial artery to axillary vein AV graft placement with 4-7 mm PTFE. This was placed by Dr. Donzetta Matters 06/23/22.  She reports that the graft is functioning well and is not having any issues at dialysis. Her last session was on Saturday and when she woke up on Sunday she couldn't feel a thrill in her graft. She called her Dialysis Center and had a thrombectomy performed by [facility] this morning. She is scheduled to go to dialysis after this visit since she missed her treatment yesterday. She has numbness is in her left fingers. She says this has been present for a long time. It is not changed. She reports no pain or tissue loss.   The patient's PMH, PSH, SH, and FamHx were reviewed and are unchanged from her prior visit.  Current Outpatient Medications  Medication Sig Dispense Refill   Accu-Chek FastClix Lancets MISC      ACCU-CHEK GUIDE test strip 1 each by Other route in the morning and at bedtime. E11.22  1   acetaminophen (TYLENOL) 500 MG tablet Take 500-1,000 mg by mouth every 6 (six) hours as needed for moderate pain or headache.     allopurinol (ZYLOPRIM) 100 MG tablet Take 100 mg by mouth Every Tuesday,Thursday,and Saturday with dialysis. After hemodialysis     B Complex-C-Zn-Folic Acid (DIALYVITE 841-YSAY 15) 0.8 MG TABS Take 1 tablet by mouth daily with supper.     Blood Glucose Monitoring Suppl (ACCU-CHEK GUIDE) w/Device KIT      calcium acetate (PHOSLO) 667 MG capsule Take 1,334 mg by mouth 3 (three) times daily with meals.  3   carvedilol (COREG) 25 MG tablet Take 25 mg by mouth in the morning and at bedtime.      Continuous Blood Gluc Sensor (DEXCOM G7 SENSOR) MISC Change every 10 days 3 each 2   famotidine (PEPCID) 20 MG tablet Take 20 mg by mouth daily.     guaifenesin (ROBITUSSIN) 100  MG/5ML syrup Take 100 mg by mouth 3 (three) times daily as needed for cough.     insulin NPH Human (NOVOLIN N) 100 UNIT/ML injection Inject 0.15 mLs (15 Units total) into the skin daily before breakfast. 20 mL 3   insulin regular (NOVOLIN R) 100 units/mL injection Inject 0.15 mLs (15 Units total) into the skin daily. And syringes 1/day 20 mL 3   iron sucrose in sodium chloride 0.9 % 100 mL Iron Sucrose (Venofer)     loratadine (CLARITIN) 10 MG tablet Take 10 mg by mouth daily.     Methoxy PEG-Epoetin Beta (MIRCERA IJ) Mircera     oxyCODONE-acetaminophen (PERCOCET) 5-325 MG tablet Take 1 tablet by mouth every 6 (six) hours as needed for severe pain. 20 tablet 0   RELION INSULIN SYR 0.5ML/31G 31G X 5/16" 0.5 ML MISC USE 1 ONCE DAILY 100 each 2   rosuvastatin (CRESTOR) 10 MG tablet Take 10 mg by mouth in the morning.     Current Facility-Administered Medications  Medication Dose Route Frequency Provider Last Rate Last Admin   0.9 %  sodium chloride infusion  250 mL Intravenous PRN Waynetta Sandy, MD       sodium chloride flush (NS) 0.9 % injection 3 mL  3 mL Intravenous Q12H Waynetta Sandy, MD       sodium chloride  flush (NS) 0.9 % injection 3 mL  3 mL Intravenous PRN Waynetta Sandy, MD        On ROS today: negative unless stated in HPI   Physical Examination   Vitals:   09/13/22 1226  BP: (!) 184/89  Pulse: 83  Temp: (!) 97.5 F (36.4 C)  TempSrc: Temporal  SpO2: 100%  Weight: 201 lb (91.2 kg)  Height: 5' (1.524 m)   Body mass index is 39.26 kg/m.  General Well appearing, well nourished, in no distress  Pulmonary Non labored  Cardiac Regular rate and rhythm  Vascular Vessel Right Left  Radial Palpable Faintly palpable  Brachial Palpable Palpable  Ulnar Not palpable Not palpable    Musculo- skeletal LUE graft with good thrill. Bolsters in place with dressings any nylon suture M/S 5/5 throughout  , Extremities without ischemic changes     Neurologic A&O; CN grossly intact     Non-invasive Vascular Imaging   VAS [place] Steal exam: 09/13/22   Summary:  Patent arteriovenous graft with velocities and measurements noted above.  With compression of the outflow AVG LT axillary vein left 2nd digit  waveform and  pressure becomes abnormal. Negative for AV steal.    Medical Decision Making   Katie Garcia is a 81 y.o. female who presents for evaluation of numbness and faint left radial pulse. She does have faint radial pulse however no signs of ischemia. She has no pain, or tissue loss. She has numbness but this has been present for over 1 year. She has had multiple access surgeries and her numbness is likely permanent. Discussed with patient that even if we ligated her AV G that she would have residual numbness so would not recommend this unless it becomes intolerable. Her duplex today did not indicate any steal. She can follow up with her Dialysis center for suture removal tomorrow. She will follow up with [place] as needed.   Karoline Caldwell, PA-C [facility] Office: [phone]  Clinic MD: Yetta Barre

## 2022-09-13 ENCOUNTER — Ambulatory Visit: Payer: Medicare Other | Admitting: Physician Assistant

## 2022-09-13 ENCOUNTER — Ambulatory Visit (HOSPITAL_COMMUNITY)
Admission: RE | Admit: 2022-09-13 | Discharge: 2022-09-13 | Disposition: A | Discharge status: Home / Self Care | Payer: Medicare Other | Source: Ambulatory Visit | Attending: Vascular Surgery | Admitting: Vascular Surgery

## 2022-09-13 VITALS — BP 184/89 | HR 83 | Temp 97.5°F | Ht 60.0 in | Wt 201.0 lb

## 2022-09-13 DIAGNOSIS — N186 End stage renal disease: Secondary | ICD-10-CM | POA: Insufficient documentation

## 2022-09-13 DIAGNOSIS — Z992 Dependence on renal dialysis: Secondary | ICD-10-CM | POA: Diagnosis not present

## 2022-09-13 DIAGNOSIS — T82868A Thrombosis of vascular prosthetic devices, implants and grafts, initial encounter: Secondary | ICD-10-CM | POA: Diagnosis not present

## 2022-09-13 DIAGNOSIS — I871 Compression of vein: Secondary | ICD-10-CM | POA: Diagnosis not present

## 2022-09-14 DIAGNOSIS — N186 End stage renal disease: Secondary | ICD-10-CM | POA: Diagnosis not present

## 2022-09-14 DIAGNOSIS — E1129 Type 2 diabetes mellitus with other diabetic kidney complication: Secondary | ICD-10-CM | POA: Diagnosis not present

## 2022-09-14 DIAGNOSIS — D631 Anemia in chronic kidney disease: Secondary | ICD-10-CM | POA: Diagnosis not present

## 2022-09-14 DIAGNOSIS — N2581 Secondary hyperparathyroidism of renal origin: Secondary | ICD-10-CM | POA: Diagnosis not present

## 2022-09-14 DIAGNOSIS — R519 Headache, unspecified: Secondary | ICD-10-CM | POA: Diagnosis not present

## 2022-09-14 DIAGNOSIS — Z992 Dependence on renal dialysis: Secondary | ICD-10-CM | POA: Diagnosis not present

## 2022-09-14 DIAGNOSIS — D688 Other specified coagulation defects: Secondary | ICD-10-CM | POA: Diagnosis not present

## 2022-09-16 DIAGNOSIS — N2581 Secondary hyperparathyroidism of renal origin: Secondary | ICD-10-CM | POA: Diagnosis not present

## 2022-09-16 DIAGNOSIS — E1129 Type 2 diabetes mellitus with other diabetic kidney complication: Secondary | ICD-10-CM | POA: Diagnosis not present

## 2022-09-16 DIAGNOSIS — Z992 Dependence on renal dialysis: Secondary | ICD-10-CM | POA: Diagnosis not present

## 2022-09-16 DIAGNOSIS — D631 Anemia in chronic kidney disease: Secondary | ICD-10-CM | POA: Diagnosis not present

## 2022-09-16 DIAGNOSIS — N186 End stage renal disease: Secondary | ICD-10-CM | POA: Diagnosis not present

## 2022-09-16 DIAGNOSIS — D688 Other specified coagulation defects: Secondary | ICD-10-CM | POA: Diagnosis not present

## 2022-09-16 DIAGNOSIS — R519 Headache, unspecified: Secondary | ICD-10-CM | POA: Diagnosis not present

## 2022-09-19 DIAGNOSIS — D688 Other specified coagulation defects: Secondary | ICD-10-CM | POA: Diagnosis not present

## 2022-09-19 DIAGNOSIS — R519 Headache, unspecified: Secondary | ICD-10-CM | POA: Diagnosis not present

## 2022-09-19 DIAGNOSIS — N186 End stage renal disease: Secondary | ICD-10-CM | POA: Diagnosis not present

## 2022-09-19 DIAGNOSIS — N2581 Secondary hyperparathyroidism of renal origin: Secondary | ICD-10-CM | POA: Diagnosis not present

## 2022-09-19 DIAGNOSIS — E1129 Type 2 diabetes mellitus with other diabetic kidney complication: Secondary | ICD-10-CM | POA: Diagnosis not present

## 2022-09-19 DIAGNOSIS — Z992 Dependence on renal dialysis: Secondary | ICD-10-CM | POA: Diagnosis not present

## 2022-09-19 DIAGNOSIS — D631 Anemia in chronic kidney disease: Secondary | ICD-10-CM | POA: Diagnosis not present

## 2022-09-21 DIAGNOSIS — R519 Headache, unspecified: Secondary | ICD-10-CM | POA: Diagnosis not present

## 2022-09-21 DIAGNOSIS — N2581 Secondary hyperparathyroidism of renal origin: Secondary | ICD-10-CM | POA: Diagnosis not present

## 2022-09-21 DIAGNOSIS — D631 Anemia in chronic kidney disease: Secondary | ICD-10-CM | POA: Diagnosis not present

## 2022-09-21 DIAGNOSIS — N186 End stage renal disease: Secondary | ICD-10-CM | POA: Diagnosis not present

## 2022-09-21 DIAGNOSIS — Z992 Dependence on renal dialysis: Secondary | ICD-10-CM | POA: Diagnosis not present

## 2022-09-21 DIAGNOSIS — E1129 Type 2 diabetes mellitus with other diabetic kidney complication: Secondary | ICD-10-CM | POA: Diagnosis not present

## 2022-09-21 DIAGNOSIS — D688 Other specified coagulation defects: Secondary | ICD-10-CM | POA: Diagnosis not present

## 2022-09-23 DIAGNOSIS — E1122 Type 2 diabetes mellitus with diabetic chronic kidney disease: Secondary | ICD-10-CM | POA: Diagnosis not present

## 2022-09-23 DIAGNOSIS — D688 Other specified coagulation defects: Secondary | ICD-10-CM | POA: Diagnosis not present

## 2022-09-23 DIAGNOSIS — E1129 Type 2 diabetes mellitus with other diabetic kidney complication: Secondary | ICD-10-CM | POA: Diagnosis not present

## 2022-09-23 DIAGNOSIS — N186 End stage renal disease: Secondary | ICD-10-CM | POA: Diagnosis not present

## 2022-09-23 DIAGNOSIS — Z992 Dependence on renal dialysis: Secondary | ICD-10-CM | POA: Diagnosis not present

## 2022-09-23 DIAGNOSIS — R519 Headache, unspecified: Secondary | ICD-10-CM | POA: Diagnosis not present

## 2022-09-23 DIAGNOSIS — D631 Anemia in chronic kidney disease: Secondary | ICD-10-CM | POA: Diagnosis not present

## 2022-09-23 DIAGNOSIS — N2581 Secondary hyperparathyroidism of renal origin: Secondary | ICD-10-CM | POA: Diagnosis not present

## 2022-09-26 DIAGNOSIS — R519 Headache, unspecified: Secondary | ICD-10-CM | POA: Diagnosis not present

## 2022-09-26 DIAGNOSIS — Z992 Dependence on renal dialysis: Secondary | ICD-10-CM | POA: Diagnosis not present

## 2022-09-26 DIAGNOSIS — D688 Other specified coagulation defects: Secondary | ICD-10-CM | POA: Diagnosis not present

## 2022-09-26 DIAGNOSIS — E1129 Type 2 diabetes mellitus with other diabetic kidney complication: Secondary | ICD-10-CM | POA: Diagnosis not present

## 2022-09-26 DIAGNOSIS — N2581 Secondary hyperparathyroidism of renal origin: Secondary | ICD-10-CM | POA: Diagnosis not present

## 2022-09-26 DIAGNOSIS — N186 End stage renal disease: Secondary | ICD-10-CM | POA: Diagnosis not present

## 2022-09-26 DIAGNOSIS — Z23 Encounter for immunization: Secondary | ICD-10-CM | POA: Diagnosis not present

## 2022-09-26 DIAGNOSIS — D631 Anemia in chronic kidney disease: Secondary | ICD-10-CM | POA: Diagnosis not present

## 2022-09-28 DIAGNOSIS — D688 Other specified coagulation defects: Secondary | ICD-10-CM | POA: Diagnosis not present

## 2022-09-28 DIAGNOSIS — Z23 Encounter for immunization: Secondary | ICD-10-CM | POA: Diagnosis not present

## 2022-09-28 DIAGNOSIS — Z992 Dependence on renal dialysis: Secondary | ICD-10-CM | POA: Diagnosis not present

## 2022-09-28 DIAGNOSIS — N186 End stage renal disease: Secondary | ICD-10-CM | POA: Diagnosis not present

## 2022-09-28 DIAGNOSIS — E1129 Type 2 diabetes mellitus with other diabetic kidney complication: Secondary | ICD-10-CM | POA: Diagnosis not present

## 2022-09-28 DIAGNOSIS — R519 Headache, unspecified: Secondary | ICD-10-CM | POA: Diagnosis not present

## 2022-09-28 DIAGNOSIS — D631 Anemia in chronic kidney disease: Secondary | ICD-10-CM | POA: Diagnosis not present

## 2022-09-28 DIAGNOSIS — N2581 Secondary hyperparathyroidism of renal origin: Secondary | ICD-10-CM | POA: Diagnosis not present

## 2022-09-30 DIAGNOSIS — E1129 Type 2 diabetes mellitus with other diabetic kidney complication: Secondary | ICD-10-CM | POA: Diagnosis not present

## 2022-09-30 DIAGNOSIS — N2581 Secondary hyperparathyroidism of renal origin: Secondary | ICD-10-CM | POA: Diagnosis not present

## 2022-09-30 DIAGNOSIS — R519 Headache, unspecified: Secondary | ICD-10-CM | POA: Diagnosis not present

## 2022-09-30 DIAGNOSIS — D631 Anemia in chronic kidney disease: Secondary | ICD-10-CM | POA: Diagnosis not present

## 2022-09-30 DIAGNOSIS — N186 End stage renal disease: Secondary | ICD-10-CM | POA: Diagnosis not present

## 2022-09-30 DIAGNOSIS — Z23 Encounter for immunization: Secondary | ICD-10-CM | POA: Diagnosis not present

## 2022-09-30 DIAGNOSIS — D688 Other specified coagulation defects: Secondary | ICD-10-CM | POA: Diagnosis not present

## 2022-09-30 DIAGNOSIS — Z992 Dependence on renal dialysis: Secondary | ICD-10-CM | POA: Diagnosis not present

## 2022-10-02 DIAGNOSIS — Z23 Encounter for immunization: Secondary | ICD-10-CM | POA: Diagnosis not present

## 2022-10-02 DIAGNOSIS — D631 Anemia in chronic kidney disease: Secondary | ICD-10-CM | POA: Diagnosis not present

## 2022-10-02 DIAGNOSIS — D688 Other specified coagulation defects: Secondary | ICD-10-CM | POA: Diagnosis not present

## 2022-10-02 DIAGNOSIS — E1129 Type 2 diabetes mellitus with other diabetic kidney complication: Secondary | ICD-10-CM | POA: Diagnosis not present

## 2022-10-02 DIAGNOSIS — N186 End stage renal disease: Secondary | ICD-10-CM | POA: Diagnosis not present

## 2022-10-02 DIAGNOSIS — N2581 Secondary hyperparathyroidism of renal origin: Secondary | ICD-10-CM | POA: Diagnosis not present

## 2022-10-02 DIAGNOSIS — Z992 Dependence on renal dialysis: Secondary | ICD-10-CM | POA: Diagnosis not present

## 2022-10-02 DIAGNOSIS — R519 Headache, unspecified: Secondary | ICD-10-CM | POA: Diagnosis not present

## 2022-10-03 ENCOUNTER — Other Ambulatory Visit: Payer: Self-pay

## 2022-10-03 DIAGNOSIS — Z23 Encounter for immunization: Secondary | ICD-10-CM | POA: Diagnosis not present

## 2022-10-03 DIAGNOSIS — E1165 Type 2 diabetes mellitus with hyperglycemia: Secondary | ICD-10-CM

## 2022-10-03 DIAGNOSIS — D631 Anemia in chronic kidney disease: Secondary | ICD-10-CM | POA: Diagnosis not present

## 2022-10-03 DIAGNOSIS — N2581 Secondary hyperparathyroidism of renal origin: Secondary | ICD-10-CM | POA: Diagnosis not present

## 2022-10-03 DIAGNOSIS — Z992 Dependence on renal dialysis: Secondary | ICD-10-CM | POA: Diagnosis not present

## 2022-10-03 DIAGNOSIS — N186 End stage renal disease: Secondary | ICD-10-CM | POA: Diagnosis not present

## 2022-10-03 DIAGNOSIS — E1129 Type 2 diabetes mellitus with other diabetic kidney complication: Secondary | ICD-10-CM | POA: Diagnosis not present

## 2022-10-03 DIAGNOSIS — R519 Headache, unspecified: Secondary | ICD-10-CM | POA: Diagnosis not present

## 2022-10-03 DIAGNOSIS — D688 Other specified coagulation defects: Secondary | ICD-10-CM | POA: Diagnosis not present

## 2022-10-03 MED ORDER — "RELION INSULIN SYRINGE 31G X 5/16"" 0.5 ML MISC": 2 refills | Status: DC

## 2022-10-04 ENCOUNTER — Ambulatory Visit (INDEPENDENT_AMBULATORY_CARE_PROVIDER_SITE_OTHER): Payer: Medicare Other | Admitting: Podiatry

## 2022-10-04 DIAGNOSIS — B351 Tinea unguium: Secondary | ICD-10-CM | POA: Diagnosis not present

## 2022-10-04 DIAGNOSIS — L84 Corns and callosities: Secondary | ICD-10-CM | POA: Diagnosis not present

## 2022-10-04 DIAGNOSIS — Z992 Dependence on renal dialysis: Secondary | ICD-10-CM

## 2022-10-04 DIAGNOSIS — Z794 Long term (current) use of insulin: Secondary | ICD-10-CM

## 2022-10-04 DIAGNOSIS — N186 End stage renal disease: Secondary | ICD-10-CM

## 2022-10-04 DIAGNOSIS — M79674 Pain in right toe(s): Secondary | ICD-10-CM | POA: Diagnosis not present

## 2022-10-04 DIAGNOSIS — E0822 Diabetes mellitus due to underlying condition with diabetic chronic kidney disease: Secondary | ICD-10-CM | POA: Diagnosis not present

## 2022-10-04 DIAGNOSIS — M79675 Pain in left toe(s): Secondary | ICD-10-CM

## 2022-10-05 DIAGNOSIS — N2581 Secondary hyperparathyroidism of renal origin: Secondary | ICD-10-CM | POA: Diagnosis not present

## 2022-10-05 DIAGNOSIS — N186 End stage renal disease: Secondary | ICD-10-CM | POA: Diagnosis not present

## 2022-10-05 DIAGNOSIS — Z23 Encounter for immunization: Secondary | ICD-10-CM | POA: Diagnosis not present

## 2022-10-05 DIAGNOSIS — Z992 Dependence on renal dialysis: Secondary | ICD-10-CM | POA: Diagnosis not present

## 2022-10-05 DIAGNOSIS — D688 Other specified coagulation defects: Secondary | ICD-10-CM | POA: Diagnosis not present

## 2022-10-05 DIAGNOSIS — E1129 Type 2 diabetes mellitus with other diabetic kidney complication: Secondary | ICD-10-CM | POA: Diagnosis not present

## 2022-10-05 DIAGNOSIS — D631 Anemia in chronic kidney disease: Secondary | ICD-10-CM | POA: Diagnosis not present

## 2022-10-05 DIAGNOSIS — R519 Headache, unspecified: Secondary | ICD-10-CM | POA: Diagnosis not present

## 2022-10-07 DIAGNOSIS — Z992 Dependence on renal dialysis: Secondary | ICD-10-CM | POA: Diagnosis not present

## 2022-10-07 DIAGNOSIS — D688 Other specified coagulation defects: Secondary | ICD-10-CM | POA: Diagnosis not present

## 2022-10-07 DIAGNOSIS — R519 Headache, unspecified: Secondary | ICD-10-CM | POA: Diagnosis not present

## 2022-10-07 DIAGNOSIS — E1129 Type 2 diabetes mellitus with other diabetic kidney complication: Secondary | ICD-10-CM | POA: Diagnosis not present

## 2022-10-07 DIAGNOSIS — Z23 Encounter for immunization: Secondary | ICD-10-CM | POA: Diagnosis not present

## 2022-10-07 DIAGNOSIS — N2581 Secondary hyperparathyroidism of renal origin: Secondary | ICD-10-CM | POA: Diagnosis not present

## 2022-10-07 DIAGNOSIS — D631 Anemia in chronic kidney disease: Secondary | ICD-10-CM | POA: Diagnosis not present

## 2022-10-07 DIAGNOSIS — N186 End stage renal disease: Secondary | ICD-10-CM | POA: Diagnosis not present

## 2022-10-08 ENCOUNTER — Encounter: Payer: Self-pay | Admitting: Podiatry

## 2022-10-08 NOTE — Progress Notes (Signed)
  Subjective:  Patient ID: Katie Garcia, female    DOB: 01-27-1941,  MRN: [id number]  Katie Garcia presents to clinic today for:  Chief Complaint  Patient presents with   Nail Problem    Diabetic foot care BS-did not check today A1C-7.0 PCP-Varadarajan PCP VST-Early 2023   New problem(s): None.   PCP is [name], MD.  Allergies  Allergen Reactions   Penicillins Rash    Did it involve swelling of the face/tongue/throat, SOB, or low BP? No Did it involve sudden or severe rash/hives, skin peeling, or any reaction on the inside of your mouth or nose? Yes Did you need to seek medical attention at a hospital or doctor's office? N/A When did it last happen? N/A      If all above answers are "NO", may proceed with cephalosporin use.    Review of Systems: Negative except as noted in the HPI.  Objective: No changes noted in today's physical examination.  Katie Garcia is a pleasant 81 y.o. female in NAD. AAO x 3. Vascular CFT <3 seconds b/l LE. Palpable DP pulse(s) b/l LE. Faintly palpable PT pulse(s) b/l LE. Pedal hair absent. No pain with calf compression b/l. Trace edema noted BLE.  Neurologic Normal speech. Oriented to person, place, and time. Protective sensation intact 5/5 intact bilaterally with 10g monofilament b/l. Vibratory sensation intact b/l.  Dermatologic Pedal integument with normal turgor, texture and tone b/l LE. No open wounds b/l. No interdigital macerations b/l. Toenails 1-5 b/l elongated, thickened, discolored with subungual debris. +Tenderness with dorsal palpation of nailplates. Hyperkeratotic lesion(s) noted submet head 5 b/l.  Orthopedic: Normal muscle strength 5/5 to all lower extremity muscle groups bilaterally. Pes planus deformity noted bilateral LE.Marland KitchenNo pain, crepitus or joint limitation noted with ROM b/l LE.  Patient ambulates independently without assistive aids.   Radiographs: None Assessment/Plan: 1. Pain due to onychomycosis of toenails  of both feet   2. Callus   3. Diabetes mellitus due to underlying condition with chronic kidney disease on chronic dialysis, with long-term current use of insulin ([place])     No orders of the defined types were placed in this encounter.   -Consent given for treatment as described below: -Examined patient. -Continue foot and shoe inspections daily. Monitor blood glucose per PCP/Endocrinologist's recommendations. -Toenails 1-5 b/l were debrided in length and girth with sterile nail nippers and dremel without iatrogenic bleeding.  -Callus(es) submet head 5 b/l pared utilizing sterile scalpel blade without complication or incident. Total number debrided =2. -Patient/POA to call should there be question/concern in the interim.   Return in about 3 months (around 01/04/2023).  Marzetta Board, DPM

## 2022-10-10 DIAGNOSIS — D688 Other specified coagulation defects: Secondary | ICD-10-CM | POA: Diagnosis not present

## 2022-10-10 DIAGNOSIS — Z23 Encounter for immunization: Secondary | ICD-10-CM | POA: Diagnosis not present

## 2022-10-10 DIAGNOSIS — R519 Headache, unspecified: Secondary | ICD-10-CM | POA: Diagnosis not present

## 2022-10-10 DIAGNOSIS — N2581 Secondary hyperparathyroidism of renal origin: Secondary | ICD-10-CM | POA: Diagnosis not present

## 2022-10-10 DIAGNOSIS — E1129 Type 2 diabetes mellitus with other diabetic kidney complication: Secondary | ICD-10-CM | POA: Diagnosis not present

## 2022-10-10 DIAGNOSIS — Z992 Dependence on renal dialysis: Secondary | ICD-10-CM | POA: Diagnosis not present

## 2022-10-10 DIAGNOSIS — N186 End stage renal disease: Secondary | ICD-10-CM | POA: Diagnosis not present

## 2022-10-10 DIAGNOSIS — D631 Anemia in chronic kidney disease: Secondary | ICD-10-CM | POA: Diagnosis not present

## 2022-10-12 DIAGNOSIS — D631 Anemia in chronic kidney disease: Secondary | ICD-10-CM | POA: Diagnosis not present

## 2022-10-12 DIAGNOSIS — Z992 Dependence on renal dialysis: Secondary | ICD-10-CM | POA: Diagnosis not present

## 2022-10-12 DIAGNOSIS — D688 Other specified coagulation defects: Secondary | ICD-10-CM | POA: Diagnosis not present

## 2022-10-12 DIAGNOSIS — Z23 Encounter for immunization: Secondary | ICD-10-CM | POA: Diagnosis not present

## 2022-10-12 DIAGNOSIS — N186 End stage renal disease: Secondary | ICD-10-CM | POA: Diagnosis not present

## 2022-10-12 DIAGNOSIS — E1129 Type 2 diabetes mellitus with other diabetic kidney complication: Secondary | ICD-10-CM | POA: Diagnosis not present

## 2022-10-12 DIAGNOSIS — N2581 Secondary hyperparathyroidism of renal origin: Secondary | ICD-10-CM | POA: Diagnosis not present

## 2022-10-12 DIAGNOSIS — R519 Headache, unspecified: Secondary | ICD-10-CM | POA: Diagnosis not present

## 2022-10-14 DIAGNOSIS — N186 End stage renal disease: Secondary | ICD-10-CM | POA: Diagnosis not present

## 2022-10-14 DIAGNOSIS — R519 Headache, unspecified: Secondary | ICD-10-CM | POA: Diagnosis not present

## 2022-10-14 DIAGNOSIS — Z23 Encounter for immunization: Secondary | ICD-10-CM | POA: Diagnosis not present

## 2022-10-14 DIAGNOSIS — N2581 Secondary hyperparathyroidism of renal origin: Secondary | ICD-10-CM | POA: Diagnosis not present

## 2022-10-14 DIAGNOSIS — D688 Other specified coagulation defects: Secondary | ICD-10-CM | POA: Diagnosis not present

## 2022-10-14 DIAGNOSIS — Z992 Dependence on renal dialysis: Secondary | ICD-10-CM | POA: Diagnosis not present

## 2022-10-14 DIAGNOSIS — D631 Anemia in chronic kidney disease: Secondary | ICD-10-CM | POA: Diagnosis not present

## 2022-10-14 DIAGNOSIS — E1129 Type 2 diabetes mellitus with other diabetic kidney complication: Secondary | ICD-10-CM | POA: Diagnosis not present

## 2022-10-17 DIAGNOSIS — N186 End stage renal disease: Secondary | ICD-10-CM | POA: Diagnosis not present

## 2022-10-17 DIAGNOSIS — E1129 Type 2 diabetes mellitus with other diabetic kidney complication: Secondary | ICD-10-CM | POA: Diagnosis not present

## 2022-10-17 DIAGNOSIS — Z23 Encounter for immunization: Secondary | ICD-10-CM | POA: Diagnosis not present

## 2022-10-17 DIAGNOSIS — D631 Anemia in chronic kidney disease: Secondary | ICD-10-CM | POA: Diagnosis not present

## 2022-10-17 DIAGNOSIS — R519 Headache, unspecified: Secondary | ICD-10-CM | POA: Diagnosis not present

## 2022-10-17 DIAGNOSIS — Z992 Dependence on renal dialysis: Secondary | ICD-10-CM | POA: Diagnosis not present

## 2022-10-17 DIAGNOSIS — D688 Other specified coagulation defects: Secondary | ICD-10-CM | POA: Diagnosis not present

## 2022-10-17 DIAGNOSIS — N2581 Secondary hyperparathyroidism of renal origin: Secondary | ICD-10-CM | POA: Diagnosis not present

## 2022-10-18 ENCOUNTER — Other Ambulatory Visit (INDEPENDENT_AMBULATORY_CARE_PROVIDER_SITE_OTHER): Payer: Medicare Other

## 2022-10-18 DIAGNOSIS — E1165 Type 2 diabetes mellitus with hyperglycemia: Secondary | ICD-10-CM

## 2022-10-18 DIAGNOSIS — Z794 Long term (current) use of insulin: Secondary | ICD-10-CM | POA: Diagnosis not present

## 2022-10-18 LAB — HEMOGLOBIN A1C: Hgb A1c MFr Bld: 9 % — ABNORMAL HIGH (ref 4.6–6.5)

## 2022-10-18 LAB — GLUCOSE, RANDOM: Glucose, Bld: 179 mg/dL — ABNORMAL HIGH (ref 70–99)

## 2022-10-19 DIAGNOSIS — N186 End stage renal disease: Secondary | ICD-10-CM | POA: Diagnosis not present

## 2022-10-19 DIAGNOSIS — Z992 Dependence on renal dialysis: Secondary | ICD-10-CM | POA: Diagnosis not present

## 2022-10-19 DIAGNOSIS — N2581 Secondary hyperparathyroidism of renal origin: Secondary | ICD-10-CM | POA: Diagnosis not present

## 2022-10-19 DIAGNOSIS — D688 Other specified coagulation defects: Secondary | ICD-10-CM | POA: Diagnosis not present

## 2022-10-19 DIAGNOSIS — R519 Headache, unspecified: Secondary | ICD-10-CM | POA: Diagnosis not present

## 2022-10-19 DIAGNOSIS — E1129 Type 2 diabetes mellitus with other diabetic kidney complication: Secondary | ICD-10-CM | POA: Diagnosis not present

## 2022-10-19 DIAGNOSIS — D631 Anemia in chronic kidney disease: Secondary | ICD-10-CM | POA: Diagnosis not present

## 2022-10-19 DIAGNOSIS — Z23 Encounter for immunization: Secondary | ICD-10-CM | POA: Diagnosis not present

## 2022-10-20 ENCOUNTER — Encounter: Payer: Self-pay | Admitting: Endocrinology

## 2022-10-20 ENCOUNTER — Ambulatory Visit (INDEPENDENT_AMBULATORY_CARE_PROVIDER_SITE_OTHER): Payer: Medicare Other | Admitting: Endocrinology

## 2022-10-20 VITALS — BP 104/68 | HR 98 | Ht 60.0 in | Wt 192.0 lb

## 2022-10-20 DIAGNOSIS — E1165 Type 2 diabetes mellitus with hyperglycemia: Secondary | ICD-10-CM | POA: Diagnosis not present

## 2022-10-20 DIAGNOSIS — Z794 Long term (current) use of insulin: Secondary | ICD-10-CM | POA: Diagnosis not present

## 2022-10-20 MED ORDER — INSULIN PEN NEEDLE 31G X 5 MM MISC: 1 refills | Status: DC

## 2022-10-20 MED ORDER — LANTUS SOLOSTAR 100 UNIT/ML ~~LOC~~ SOPN: 30.0000 [IU] | PEN_INJECTOR | Freq: Every day | SUBCUTANEOUS | 1 refills | Status: DC

## 2022-10-20 NOTE — Progress Notes (Unsigned)
Patient ID: Katie Garcia, female   DOB: [birth year], 81 y.o.   MRN: [id number]           Reason for Appointment: Type II Diabetes follow-up   History of Present Illness   Diagnosis date: 1980  Previous history:  Non-insulin hypoglycemic drugs previously used: Unknown Insulin was started soon after diagnosis, previously had been on Levemir, Tresiba, Lantus and NovoLog  A1c range in the last few years is: 7.1-11.1  Recent history:         Insulin regimen: 15 N, 15 R           Side effects from medications: None  Current self management, blood sugar patterns and problems identified:  A1c is 7.7 compared to 8.9 previously Currently she is only taking insulin once a day before her first meal which is different depending on whether she is doing dialysis in the mornings She usually takes insulin just before eating with both doses of insulin at the same time Unclear how often she checks her blood sugars as she did not bring a monitor She likely has been on NPH and regular insulin and not other insulins because of cost She says that depending on how much she is eating she may occasionally have a low sugar symptom before dinnertime However on the days of dialysis she only is eating lunch and dinner and skipping breakfast and will take her insulin when she is back from dialysis She is not aware of continuous glucose monitoring, she has some difficulty with fingersticks especially with boluses of her left hand Also generally not able to do much physical activity for exercise She has not seen a dietitian in a few years and is not following any particular meal plan  [employer]   Diet management: Usually avoiding sweets and drinks with sugar  Breakfast at 9-10 am, lunch 2-3 p.m., dinner 6-7 pm     Hypoglycemia: Occasionally at 5-6pm   CGM use % of time   2-week average/GV 252  Time in range        %  % Time Above 180   % Time above 250   % Time Below 70      PRE-MEAL Fasting  Lunch Dinner Bedtime Overall  Glucose range:       Averages:        POST-MEAL PC Breakfast PC Lunch PC Dinner  Glucose range:     Averages:        Glucometer: Accu-Chek  Blood Glucose readings from recall  PRE-MEAL Fasting Lunch Dinner Bedtime Overall  Glucose range: About 170   About 200   Mean/median:         Dietician visit: Most recent:  2019    Weight control:  Wt Readings from Last 3 Encounters:  10/20/22 192 lb (87.1 kg)  09/13/22 201 lb (91.2 kg)  07/19/22 193 lb 3.2 oz (87.6 kg)         Diabetes labs:  Lab Results  Component Value Date   HGBA1C 9.0 (H) 10/18/2022   HGBA1C 7.7 (H) 07/17/2022   HGBA1C 8.9 (A) 03/09/2022   Lab Results  Component Value Date   LDLCALC 83 07/17/2022   CREATININE 7.10 (H) 06/23/2022    Lab Results  Component Value Date   FRUCTOSAMINE 408 (H) 08/29/2019   Other active problems addressed today: See review of systems  Allergies as of 10/20/2022       Reactions   Penicillins Rash   Did it involve swelling of  the face/tongue/throat, SOB, or low BP? No Did it involve sudden or severe rash/hives, skin peeling, or any reaction on the inside of your mouth or nose? Yes Did you need to seek medical attention at a hospital or doctor's office? N/A When did it last happen? N/A      If all above answers are "NO", may proceed with cephalosporin use.        Medication List        Accurate as of October 20, 2022 10:25 AM. If you have any questions, ask your nurse or doctor.          Accu-Chek FastClix Lancets Misc   Accu-Chek Guide test strip Generic drug: glucose blood 1 each by Other route in the morning and at bedtime. E11.22   Accu-Chek Guide w/Device Kit   acetaminophen 500 MG tablet Commonly known as: TYLENOL Take 500-1,000 mg by mouth every 6 (six) hours as needed for moderate pain or headache.   allopurinol 100 MG tablet Commonly known as: ZYLOPRIM Take 100 mg by mouth Every Tuesday,Thursday,and Saturday  with dialysis. After hemodialysis   calcium acetate 667 MG capsule Commonly known as: PHOSLO Take 1,334 mg by mouth 3 (three) times daily with meals.   carvedilol 25 MG tablet Commonly known as: COREG Take 25 mg by mouth in the morning and at bedtime.   Dexcom G7 Sensor Misc Change every 10 days   Dialyvite 800-Zinc 15 0.8 MG Tabs Take 1 tablet by mouth daily with supper.   famotidine 20 MG tablet Commonly known as: PEPCID Take 20 mg by mouth daily.   guaifenesin 100 MG/5ML syrup Commonly known as: ROBITUSSIN Take 100 mg by mouth 3 (three) times daily as needed for cough.   insulin NPH Human 100 UNIT/ML injection Commonly known as: NovoLIN N Inject 0.15 mLs (15 Units total) into the skin daily before breakfast.   insulin regular 100 units/mL injection Commonly known as: NovoLIN R Inject 0.15 mLs (15 Units total) into the skin daily. And syringes 1/day   iron sucrose in sodium chloride 0.9 % 100 mL Iron Sucrose (Venofer)   loratadine 10 MG tablet Commonly known as: CLARITIN Take 10 mg by mouth daily.   meclizine 25 MG tablet Commonly known as: ANTIVERT Take by mouth.   MIRCERA IJ Mircera   oxyCODONE-acetaminophen 5-325 MG tablet Commonly known as: Percocet Take 1 tablet by mouth every 6 (six) hours as needed for severe pain.   RELION INSULIN SYR 0.5ML/31G 31G X 5/16" 0.5 ML Misc Generic drug: Insulin Syringe-Needle U-100 USE 4 times DAILY   rosuvastatin 10 MG tablet Commonly known as: CRESTOR Take 10 mg by mouth in the morning.        Allergies:  Allergies  Allergen Reactions   Penicillins Rash    Did it involve swelling of the face/tongue/throat, SOB, or low BP? No Did it involve sudden or severe rash/hives, skin peeling, or any reaction on the inside of your mouth or nose? Yes Did you need to seek medical attention at a hospital or doctor's office? N/A When did it last happen? N/A      If all above answers are "NO", may proceed with cephalosporin  use.    Past Medical History:  Diagnosis Date   Anemia    Arthritis    COVID    hospitalized   Diabetes mellitus without complication ([place])    ESRD    Dialysis T/Th/Sa at [address]   GERD (gastroesophageal reflux disease)    Gout  Headache    Hypertension    Type 2 diabetes mellitus [facility])     Past Surgical History:  Procedure Laterality Date   A/V FISTULAGRAM Left 04/05/2022   Procedure: A/V Fistulagram;  Surgeon: Broadus John, MD;  Location: [place] [facility];  Service: Cardiovascular;  Laterality: Left;   ABDOMINAL HYSTERECTOMY     AV FISTULA PLACEMENT Left 10/02/2016   Procedure: ARTERIOVENOUS (AV) FISTULA CREATION LEFT UPPER ARM;  Surgeon: Waynetta Sandy, MD;  Location: [place];  Service: Vascular;  Laterality: Left;   AV FISTULA PLACEMENT Right 07/14/2020   Procedure: RIGHT ARM ARTERIOVENOUS FISTULA CREATION;  Surgeon: Waynetta Sandy, MD;  Location: [place];  Service: Vascular;  Laterality: Right;   AV FISTULA PLACEMENT Left 05/16/2021   Procedure: INSERTION OF LEFT UPPER ARM ARTERIOVENOUS (AV) GORE-TEX GRAFT;  Surgeon: Marty Heck, MD;  Location: [place];  Service: Vascular;  Laterality: Left;   AV FISTULA PLACEMENT Right 07/13/2021   Procedure: RIGHT ARM ARTERIOVENOUS (AV) FISTULA GRAFT INSERTION;  Surgeon: Waynetta Sandy, MD;  Location: [place];  Service: Vascular;  Laterality: Right;   AV FISTULA PLACEMENT Left 02/03/2022   Procedure: LEFT UPPER ARM ARTERIOVENOUS FISTULA CREATION;  Surgeon: Waynetta Sandy, MD;  Location: [place];  Service: Vascular;  Laterality: Left;   AV FISTULA PLACEMENT Left 06/23/2022   Procedure: INSERTION OF LEFT ARM ARTERIOVENOUS (AV) GORE-TEX GRAFT;  Surgeon: Waynetta Sandy, MD;  Location: [place];  Service: Vascular;  Laterality: Left;   [place] Left 05/17/2020   Procedure: Insertion of Left arm arteriovenous gortex graftarm ;  Surgeon: Angelia Mould, MD;  Location:  [place];  Service: Vascular;  Laterality: Left;   CESAREAN SECTION     DIALYSIS/PERMA CATHETER INSERTION     IR FLUORO GUIDE CV LINE LEFT  02/09/2022   IR PTA VENOUS EXCEPT DIALYSIS CIRCUIT  02/09/2022   IR VENOCAVAGRAM SVC  02/09/2022   REVISON OF ARTERIOVENOUS FISTULA Right 09/15/2020   Procedure: REVISON OF RIGHT UPPER ARM  ARTERIOVENOUS FISTULA;  Surgeon: Waynetta Sandy, MD;  Location: [place];  Service: Vascular;  Laterality: Right;   UPPER EXTREMITY VENOGRAPHY Bilateral 04/18/2021   Procedure: UPPER EXTREMITY VENOGRAPHY;  Surgeon: Waynetta Sandy, MD;  Location: [place] [facility];  Service: Cardiovascular;  Laterality: Bilateral;   UPPER EXTREMITY VENOGRAPHY Left 01/23/2022   Procedure: UPPER EXTREMITY VENOGRAPHY;  Surgeon: Waynetta Sandy, MD;  Location: [place] [facility];  Service: Cardiovascular;  Laterality: Left;    Family History  Problem Relation Age of Onset   Diabetes Mother     Social History:  reports that she has never smoked. She has never used smokeless tobacco. She reports that she does not currently use alcohol. She reports that she does not use drugs.  Review of Systems:  Last diabetic eye exam date [place], report not available  Last foot exam date: 7/23  Symptoms of neuropathy: None She has mild peripheral vascular disease  Hypertension:   Treatment includes carvedilol  BP Readings from Last 3 Encounters:  10/20/22 104/68  09/13/22 (!) 184/89  07/19/22 102/70    Lipid management: Treated with Crestor 89m     Lab Results  Component Value Date   CHOL 156 07/17/2022   Lab Results  Component Value Date   HDL 59.60 07/17/2022   Lab Results  Component Value Date   LDLCALC 83 07/17/2022   Lab Results  Component Value Date   TRIG 65.0 07/17/2022  Lab Results  Component Value Date   CHOLHDL 3 07/17/2022   No results found for: "LDLDIRECT"   Examination:   BP 104/68   Pulse 98   Ht 5' (1.524 m)   Wt 192  lb (87.1 kg)   BMI 37.50 kg/m   Body mass index is 37.5 kg/m.    ASSESSMENT/ PLAN:    Diabetes type 2:   Current regimen: NPH and regular once a day only  See history of present illness for detailed discussion of current diabetes management, blood sugar patterns and problems identified  A1c is 7.7  Blood glucose control is usually not adequate and difficult to assess adequately because of infrequent monitoring and patient not bring her monitor for download today Also likely having consistently high readings after dinner and overnight from her history Obesity with BMI about 38 She may be able to benefit from a GLP-1 drug but would likely will not be able to afford it because of cost  Diabetic complications likely include end-stage renal disease, asymptomatic peripheral vascular disease and unknown status of retinopathy  HYPERLIPIDEMIA well-controlled with LDL 83  Recommendations:  She is a good candidate for using the continuous glucose monitoring Discussed the benefits of using CGM such as a freestyle libre and how this would be able to monitor her blood sugar more consistently at all times, alert her for high and low readings and essentially eliminate fingersticks which is difficult for her Discussed that this would be obtained from a DME supplier and will send prescription to the [facility] In the meantime we will add 6 units of NPH and regular at dinnertime to cover evening hyperglycemia Also to reduce chances of low sugars in the afternoon we will reduce her morning doses to 12 units each She will bring a monitor for download on each visit Consultation with diabetes educator as she needs review of her meal planning, timing of insulin, blood sugar patterns and further adjustment of insulin Recommended that she have annual eye exams and have report forwarded to [place]  There are no Patient Instructions on file for this visit.   Elayne Snare 10/20/2022, 10:25 AM

## 2022-10-20 NOTE — Patient Instructions (Addendum)
R insulin 18 units before breakfast and 6 units before supper  Novolin N to be STOPPED  LANTUS 20 UNITS WITH NOVOLIN R AT FIRST MEAL OF DAY  Get eye exam

## 2022-10-21 DIAGNOSIS — R519 Headache, unspecified: Secondary | ICD-10-CM | POA: Diagnosis not present

## 2022-10-21 DIAGNOSIS — N2581 Secondary hyperparathyroidism of renal origin: Secondary | ICD-10-CM | POA: Diagnosis not present

## 2022-10-21 DIAGNOSIS — E1129 Type 2 diabetes mellitus with other diabetic kidney complication: Secondary | ICD-10-CM | POA: Diagnosis not present

## 2022-10-21 DIAGNOSIS — Z992 Dependence on renal dialysis: Secondary | ICD-10-CM | POA: Diagnosis not present

## 2022-10-21 DIAGNOSIS — D631 Anemia in chronic kidney disease: Secondary | ICD-10-CM | POA: Diagnosis not present

## 2022-10-21 DIAGNOSIS — D688 Other specified coagulation defects: Secondary | ICD-10-CM | POA: Diagnosis not present

## 2022-10-21 DIAGNOSIS — N186 End stage renal disease: Secondary | ICD-10-CM | POA: Diagnosis not present

## 2022-10-21 DIAGNOSIS — Z23 Encounter for immunization: Secondary | ICD-10-CM | POA: Diagnosis not present

## 2022-10-24 ENCOUNTER — Encounter: Payer: Self-pay | Admitting: Endocrinology

## 2022-10-24 DIAGNOSIS — Z992 Dependence on renal dialysis: Secondary | ICD-10-CM | POA: Diagnosis not present

## 2022-10-24 DIAGNOSIS — N2581 Secondary hyperparathyroidism of renal origin: Secondary | ICD-10-CM | POA: Diagnosis not present

## 2022-10-24 DIAGNOSIS — E1129 Type 2 diabetes mellitus with other diabetic kidney complication: Secondary | ICD-10-CM | POA: Diagnosis not present

## 2022-10-24 DIAGNOSIS — E1122 Type 2 diabetes mellitus with diabetic chronic kidney disease: Secondary | ICD-10-CM | POA: Diagnosis not present

## 2022-10-24 DIAGNOSIS — Z23 Encounter for immunization: Secondary | ICD-10-CM | POA: Diagnosis not present

## 2022-10-24 DIAGNOSIS — D688 Other specified coagulation defects: Secondary | ICD-10-CM | POA: Diagnosis not present

## 2022-10-24 DIAGNOSIS — N186 End stage renal disease: Secondary | ICD-10-CM | POA: Diagnosis not present

## 2022-10-24 DIAGNOSIS — D631 Anemia in chronic kidney disease: Secondary | ICD-10-CM | POA: Diagnosis not present

## 2022-10-24 DIAGNOSIS — R519 Headache, unspecified: Secondary | ICD-10-CM | POA: Diagnosis not present

## 2022-10-26 DIAGNOSIS — D688 Other specified coagulation defects: Secondary | ICD-10-CM | POA: Diagnosis not present

## 2022-10-26 DIAGNOSIS — Z992 Dependence on renal dialysis: Secondary | ICD-10-CM | POA: Diagnosis not present

## 2022-10-26 DIAGNOSIS — N2581 Secondary hyperparathyroidism of renal origin: Secondary | ICD-10-CM | POA: Diagnosis not present

## 2022-10-26 DIAGNOSIS — D631 Anemia in chronic kidney disease: Secondary | ICD-10-CM | POA: Diagnosis not present

## 2022-10-26 DIAGNOSIS — R519 Headache, unspecified: Secondary | ICD-10-CM | POA: Diagnosis not present

## 2022-10-26 DIAGNOSIS — E1129 Type 2 diabetes mellitus with other diabetic kidney complication: Secondary | ICD-10-CM | POA: Diagnosis not present

## 2022-10-26 DIAGNOSIS — N186 End stage renal disease: Secondary | ICD-10-CM | POA: Diagnosis not present

## 2022-10-28 DIAGNOSIS — R519 Headache, unspecified: Secondary | ICD-10-CM | POA: Diagnosis not present

## 2022-10-28 DIAGNOSIS — Z992 Dependence on renal dialysis: Secondary | ICD-10-CM | POA: Diagnosis not present

## 2022-10-28 DIAGNOSIS — D688 Other specified coagulation defects: Secondary | ICD-10-CM | POA: Diagnosis not present

## 2022-10-28 DIAGNOSIS — D631 Anemia in chronic kidney disease: Secondary | ICD-10-CM | POA: Diagnosis not present

## 2022-10-28 DIAGNOSIS — N186 End stage renal disease: Secondary | ICD-10-CM | POA: Diagnosis not present

## 2022-10-28 DIAGNOSIS — E1129 Type 2 diabetes mellitus with other diabetic kidney complication: Secondary | ICD-10-CM | POA: Diagnosis not present

## 2022-10-28 DIAGNOSIS — N2581 Secondary hyperparathyroidism of renal origin: Secondary | ICD-10-CM | POA: Diagnosis not present

## 2022-10-31 DIAGNOSIS — R519 Headache, unspecified: Secondary | ICD-10-CM | POA: Diagnosis not present

## 2022-10-31 DIAGNOSIS — N2581 Secondary hyperparathyroidism of renal origin: Secondary | ICD-10-CM | POA: Diagnosis not present

## 2022-10-31 DIAGNOSIS — N186 End stage renal disease: Secondary | ICD-10-CM | POA: Diagnosis not present

## 2022-10-31 DIAGNOSIS — Z992 Dependence on renal dialysis: Secondary | ICD-10-CM | POA: Diagnosis not present

## 2022-10-31 DIAGNOSIS — D631 Anemia in chronic kidney disease: Secondary | ICD-10-CM | POA: Diagnosis not present

## 2022-10-31 DIAGNOSIS — D688 Other specified coagulation defects: Secondary | ICD-10-CM | POA: Diagnosis not present

## 2022-10-31 DIAGNOSIS — E1129 Type 2 diabetes mellitus with other diabetic kidney complication: Secondary | ICD-10-CM | POA: Diagnosis not present

## 2022-11-02 DIAGNOSIS — N2581 Secondary hyperparathyroidism of renal origin: Secondary | ICD-10-CM | POA: Diagnosis not present

## 2022-11-02 DIAGNOSIS — D631 Anemia in chronic kidney disease: Secondary | ICD-10-CM | POA: Diagnosis not present

## 2022-11-02 DIAGNOSIS — E1129 Type 2 diabetes mellitus with other diabetic kidney complication: Secondary | ICD-10-CM | POA: Diagnosis not present

## 2022-11-02 DIAGNOSIS — Z992 Dependence on renal dialysis: Secondary | ICD-10-CM | POA: Diagnosis not present

## 2022-11-02 DIAGNOSIS — R519 Headache, unspecified: Secondary | ICD-10-CM | POA: Diagnosis not present

## 2022-11-02 DIAGNOSIS — N186 End stage renal disease: Secondary | ICD-10-CM | POA: Diagnosis not present

## 2022-11-02 DIAGNOSIS — D688 Other specified coagulation defects: Secondary | ICD-10-CM | POA: Diagnosis not present

## 2022-11-06 DIAGNOSIS — N2581 Secondary hyperparathyroidism of renal origin: Secondary | ICD-10-CM | POA: Diagnosis not present

## 2022-11-06 DIAGNOSIS — N186 End stage renal disease: Secondary | ICD-10-CM | POA: Diagnosis not present

## 2022-11-06 DIAGNOSIS — D631 Anemia in chronic kidney disease: Secondary | ICD-10-CM | POA: Diagnosis not present

## 2022-11-06 DIAGNOSIS — D688 Other specified coagulation defects: Secondary | ICD-10-CM | POA: Diagnosis not present

## 2022-11-06 DIAGNOSIS — R519 Headache, unspecified: Secondary | ICD-10-CM | POA: Diagnosis not present

## 2022-11-06 DIAGNOSIS — E1129 Type 2 diabetes mellitus with other diabetic kidney complication: Secondary | ICD-10-CM | POA: Diagnosis not present

## 2022-11-06 DIAGNOSIS — Z992 Dependence on renal dialysis: Secondary | ICD-10-CM | POA: Diagnosis not present

## 2022-11-07 DIAGNOSIS — D631 Anemia in chronic kidney disease: Secondary | ICD-10-CM | POA: Diagnosis not present

## 2022-11-07 DIAGNOSIS — E1129 Type 2 diabetes mellitus with other diabetic kidney complication: Secondary | ICD-10-CM | POA: Diagnosis not present

## 2022-11-07 DIAGNOSIS — N2581 Secondary hyperparathyroidism of renal origin: Secondary | ICD-10-CM | POA: Diagnosis not present

## 2022-11-07 DIAGNOSIS — N186 End stage renal disease: Secondary | ICD-10-CM | POA: Diagnosis not present

## 2022-11-07 DIAGNOSIS — R519 Headache, unspecified: Secondary | ICD-10-CM | POA: Diagnosis not present

## 2022-11-07 DIAGNOSIS — Z992 Dependence on renal dialysis: Secondary | ICD-10-CM | POA: Diagnosis not present

## 2022-11-07 DIAGNOSIS — D688 Other specified coagulation defects: Secondary | ICD-10-CM | POA: Diagnosis not present

## 2022-11-08 ENCOUNTER — Encounter: Payer: Medicare Other | Admitting: Nutrition

## 2022-11-08 DIAGNOSIS — E113291 Type 2 diabetes mellitus with mild nonproliferative diabetic retinopathy without macular edema, right eye: Secondary | ICD-10-CM | POA: Diagnosis not present

## 2022-11-08 DIAGNOSIS — E113212 Type 2 diabetes mellitus with mild nonproliferative diabetic retinopathy with macular edema, left eye: Secondary | ICD-10-CM | POA: Diagnosis not present

## 2022-11-08 DIAGNOSIS — H401131 Primary open-angle glaucoma, bilateral, mild stage: Secondary | ICD-10-CM | POA: Diagnosis not present

## 2022-11-09 DIAGNOSIS — N2581 Secondary hyperparathyroidism of renal origin: Secondary | ICD-10-CM | POA: Diagnosis not present

## 2022-11-09 DIAGNOSIS — N186 End stage renal disease: Secondary | ICD-10-CM | POA: Diagnosis not present

## 2022-11-09 DIAGNOSIS — Z992 Dependence on renal dialysis: Secondary | ICD-10-CM | POA: Diagnosis not present

## 2022-11-09 DIAGNOSIS — D631 Anemia in chronic kidney disease: Secondary | ICD-10-CM | POA: Diagnosis not present

## 2022-11-09 DIAGNOSIS — E1129 Type 2 diabetes mellitus with other diabetic kidney complication: Secondary | ICD-10-CM | POA: Diagnosis not present

## 2022-11-09 DIAGNOSIS — D688 Other specified coagulation defects: Secondary | ICD-10-CM | POA: Diagnosis not present

## 2022-11-09 DIAGNOSIS — R519 Headache, unspecified: Secondary | ICD-10-CM | POA: Diagnosis not present

## 2022-11-11 DIAGNOSIS — R519 Headache, unspecified: Secondary | ICD-10-CM | POA: Diagnosis not present

## 2022-11-11 DIAGNOSIS — D631 Anemia in chronic kidney disease: Secondary | ICD-10-CM | POA: Diagnosis not present

## 2022-11-11 DIAGNOSIS — N186 End stage renal disease: Secondary | ICD-10-CM | POA: Diagnosis not present

## 2022-11-11 DIAGNOSIS — E1129 Type 2 diabetes mellitus with other diabetic kidney complication: Secondary | ICD-10-CM | POA: Diagnosis not present

## 2022-11-11 DIAGNOSIS — Z992 Dependence on renal dialysis: Secondary | ICD-10-CM | POA: Diagnosis not present

## 2022-11-11 DIAGNOSIS — D688 Other specified coagulation defects: Secondary | ICD-10-CM | POA: Diagnosis not present

## 2022-11-11 DIAGNOSIS — N2581 Secondary hyperparathyroidism of renal origin: Secondary | ICD-10-CM | POA: Diagnosis not present

## 2022-11-13 DIAGNOSIS — E1129 Type 2 diabetes mellitus with other diabetic kidney complication: Secondary | ICD-10-CM | POA: Diagnosis not present

## 2022-11-13 DIAGNOSIS — N186 End stage renal disease: Secondary | ICD-10-CM | POA: Diagnosis not present

## 2022-11-13 DIAGNOSIS — N2581 Secondary hyperparathyroidism of renal origin: Secondary | ICD-10-CM | POA: Diagnosis not present

## 2022-11-13 DIAGNOSIS — R519 Headache, unspecified: Secondary | ICD-10-CM | POA: Diagnosis not present

## 2022-11-13 DIAGNOSIS — D631 Anemia in chronic kidney disease: Secondary | ICD-10-CM | POA: Diagnosis not present

## 2022-11-13 DIAGNOSIS — D688 Other specified coagulation defects: Secondary | ICD-10-CM | POA: Diagnosis not present

## 2022-11-13 DIAGNOSIS — Z992 Dependence on renal dialysis: Secondary | ICD-10-CM | POA: Diagnosis not present

## 2022-11-15 DIAGNOSIS — N186 End stage renal disease: Secondary | ICD-10-CM | POA: Diagnosis not present

## 2022-11-15 DIAGNOSIS — D688 Other specified coagulation defects: Secondary | ICD-10-CM | POA: Diagnosis not present

## 2022-11-15 DIAGNOSIS — D631 Anemia in chronic kidney disease: Secondary | ICD-10-CM | POA: Diagnosis not present

## 2022-11-15 DIAGNOSIS — Z992 Dependence on renal dialysis: Secondary | ICD-10-CM | POA: Diagnosis not present

## 2022-11-15 DIAGNOSIS — E1129 Type 2 diabetes mellitus with other diabetic kidney complication: Secondary | ICD-10-CM | POA: Diagnosis not present

## 2022-11-15 DIAGNOSIS — N2581 Secondary hyperparathyroidism of renal origin: Secondary | ICD-10-CM | POA: Diagnosis not present

## 2022-11-15 DIAGNOSIS — R519 Headache, unspecified: Secondary | ICD-10-CM | POA: Diagnosis not present

## 2022-11-18 DIAGNOSIS — N2581 Secondary hyperparathyroidism of renal origin: Secondary | ICD-10-CM | POA: Diagnosis not present

## 2022-11-18 DIAGNOSIS — E1129 Type 2 diabetes mellitus with other diabetic kidney complication: Secondary | ICD-10-CM | POA: Diagnosis not present

## 2022-11-18 DIAGNOSIS — Z992 Dependence on renal dialysis: Secondary | ICD-10-CM | POA: Diagnosis not present

## 2022-11-18 DIAGNOSIS — D631 Anemia in chronic kidney disease: Secondary | ICD-10-CM | POA: Diagnosis not present

## 2022-11-18 DIAGNOSIS — D688 Other specified coagulation defects: Secondary | ICD-10-CM | POA: Diagnosis not present

## 2022-11-18 DIAGNOSIS — R519 Headache, unspecified: Secondary | ICD-10-CM | POA: Diagnosis not present

## 2022-11-18 DIAGNOSIS — N186 End stage renal disease: Secondary | ICD-10-CM | POA: Diagnosis not present

## 2022-11-19 DIAGNOSIS — E785 Hyperlipidemia, unspecified: Secondary | ICD-10-CM | POA: Diagnosis not present

## 2022-11-19 DIAGNOSIS — E1122 Type 2 diabetes mellitus with diabetic chronic kidney disease: Secondary | ICD-10-CM | POA: Diagnosis not present

## 2022-11-19 DIAGNOSIS — I10 Essential (primary) hypertension: Secondary | ICD-10-CM | POA: Diagnosis not present

## 2022-11-19 DIAGNOSIS — K219 Gastro-esophageal reflux disease without esophagitis: Secondary | ICD-10-CM | POA: Diagnosis not present

## 2022-11-19 DIAGNOSIS — N186 End stage renal disease: Secondary | ICD-10-CM | POA: Diagnosis not present

## 2022-11-20 ENCOUNTER — Encounter: Payer: Medicare Other | Attending: Internal Medicine | Admitting: Nutrition

## 2022-11-20 DIAGNOSIS — E1165 Type 2 diabetes mellitus with hyperglycemia: Secondary | ICD-10-CM | POA: Insufficient documentation

## 2022-11-20 DIAGNOSIS — E1100 Type 2 diabetes mellitus with hyperosmolarity without nonketotic hyperglycemic-hyperosmolar coma (NKHHC): Secondary | ICD-10-CM | POA: Diagnosis not present

## 2022-11-20 NOTE — Progress Notes (Signed)
Patient is here with her daughter to review diet, insulin doses and blood sugar readings. Insulin dose:  AM: Lantus 20 and R 18u   and 6u acS.-  she is taking the R insulin 30 minutes after eating breakfast/. Exercise: none Diet:  typical day CGM:  Dexcom;  blood sugar is currently 258 acB today.  With blood sugar not below 250 for last 24 hours.  She is using a reader. Does not have phone capable of downloading the app.   4AM  awake.  Watched tv 10AM, after dialysis eats out for breakfast, biscuit with egg and bacon or sausage.  Takes both insulins when she gets home.            The other 4 days, still take insulin pc meals, but will have scrambled eggs with small biscuit and 2 pieces of bacon or one piece of sausage, water or coffee with sweet and low 2PM: lunch:  Burger with cheese, no fries.  Few cookies for desert- sweet drink-Sprite,  4PM: snack:  popcorn, or cookies, or peanut butter crackers with regular soda 7PM: Supper.  2-3 ounces protein, 2-3 green veg., rare starchy veg., regular soda or water, or 1/2 sweet/unsweet tea. 10PM bed.  Denies eating after supper. Discussion: Need to take R insulin acB.  Discussed how this insulin works and the need to take this before the meal.  She agreed to do this. Need to stop all sweet drinks.  Suggestions given for other beverages to drink that have 0-20 caloires.  Need for exercise to help with weight loss and better blood sugar control.  To start walking for 10 min. 2-3 times per day. Need to stop cheese on sandwiches and snacks and to limit cookies in afternoon to no more than 2 (125 calories) Believe overall understanding is good but motivation is questionable.  Encouraged return visit is 1 month.

## 2022-11-21 DIAGNOSIS — N2581 Secondary hyperparathyroidism of renal origin: Secondary | ICD-10-CM | POA: Diagnosis not present

## 2022-11-21 DIAGNOSIS — N186 End stage renal disease: Secondary | ICD-10-CM | POA: Diagnosis not present

## 2022-11-21 DIAGNOSIS — R519 Headache, unspecified: Secondary | ICD-10-CM | POA: Diagnosis not present

## 2022-11-21 DIAGNOSIS — D688 Other specified coagulation defects: Secondary | ICD-10-CM | POA: Diagnosis not present

## 2022-11-21 DIAGNOSIS — D631 Anemia in chronic kidney disease: Secondary | ICD-10-CM | POA: Diagnosis not present

## 2022-11-21 DIAGNOSIS — E1129 Type 2 diabetes mellitus with other diabetic kidney complication: Secondary | ICD-10-CM | POA: Diagnosis not present

## 2022-11-21 DIAGNOSIS — Z992 Dependence on renal dialysis: Secondary | ICD-10-CM | POA: Diagnosis not present

## 2022-11-23 DIAGNOSIS — D631 Anemia in chronic kidney disease: Secondary | ICD-10-CM | POA: Diagnosis not present

## 2022-11-23 DIAGNOSIS — N2581 Secondary hyperparathyroidism of renal origin: Secondary | ICD-10-CM | POA: Diagnosis not present

## 2022-11-23 DIAGNOSIS — R519 Headache, unspecified: Secondary | ICD-10-CM | POA: Diagnosis not present

## 2022-11-23 DIAGNOSIS — D688 Other specified coagulation defects: Secondary | ICD-10-CM | POA: Diagnosis not present

## 2022-11-23 DIAGNOSIS — Z992 Dependence on renal dialysis: Secondary | ICD-10-CM | POA: Diagnosis not present

## 2022-11-23 DIAGNOSIS — E1129 Type 2 diabetes mellitus with other diabetic kidney complication: Secondary | ICD-10-CM | POA: Diagnosis not present

## 2022-11-23 DIAGNOSIS — E1122 Type 2 diabetes mellitus with diabetic chronic kidney disease: Secondary | ICD-10-CM | POA: Diagnosis not present

## 2022-11-23 DIAGNOSIS — N186 End stage renal disease: Secondary | ICD-10-CM | POA: Diagnosis not present

## 2022-11-25 DIAGNOSIS — R519 Headache, unspecified: Secondary | ICD-10-CM | POA: Diagnosis not present

## 2022-11-25 DIAGNOSIS — D631 Anemia in chronic kidney disease: Secondary | ICD-10-CM | POA: Diagnosis not present

## 2022-11-25 DIAGNOSIS — N186 End stage renal disease: Secondary | ICD-10-CM | POA: Diagnosis not present

## 2022-11-25 DIAGNOSIS — Z992 Dependence on renal dialysis: Secondary | ICD-10-CM | POA: Diagnosis not present

## 2022-11-25 DIAGNOSIS — E1129 Type 2 diabetes mellitus with other diabetic kidney complication: Secondary | ICD-10-CM | POA: Diagnosis not present

## 2022-11-25 DIAGNOSIS — D688 Other specified coagulation defects: Secondary | ICD-10-CM | POA: Diagnosis not present

## 2022-11-25 DIAGNOSIS — N2581 Secondary hyperparathyroidism of renal origin: Secondary | ICD-10-CM | POA: Diagnosis not present

## 2022-11-27 NOTE — Patient Instructions (Signed)
Take R insulin acB.  Discussed how this insulin works and the need to take this before the meal.  She agreed to do this. Stop all sweet drinks.  Drink only beverages  that have 0-20 caloires/serving.  Start walking for 10 min. 2-3 times per day.  First check with your family doctor to see if this is ok Stop cheese on sandwiches and with snacks.  Use raw vegetables with light ranch dressing.   Limit cookies in afternoon to no more than 2 (125 calories), with diet drink

## 2022-11-28 DIAGNOSIS — R519 Headache, unspecified: Secondary | ICD-10-CM | POA: Diagnosis not present

## 2022-11-28 DIAGNOSIS — N2581 Secondary hyperparathyroidism of renal origin: Secondary | ICD-10-CM | POA: Diagnosis not present

## 2022-11-28 DIAGNOSIS — N186 End stage renal disease: Secondary | ICD-10-CM | POA: Diagnosis not present

## 2022-11-28 DIAGNOSIS — E1129 Type 2 diabetes mellitus with other diabetic kidney complication: Secondary | ICD-10-CM | POA: Diagnosis not present

## 2022-11-28 DIAGNOSIS — D631 Anemia in chronic kidney disease: Secondary | ICD-10-CM | POA: Diagnosis not present

## 2022-11-28 DIAGNOSIS — Z992 Dependence on renal dialysis: Secondary | ICD-10-CM | POA: Diagnosis not present

## 2022-11-28 DIAGNOSIS — D688 Other specified coagulation defects: Secondary | ICD-10-CM | POA: Diagnosis not present

## 2022-11-30 DIAGNOSIS — N186 End stage renal disease: Secondary | ICD-10-CM | POA: Diagnosis not present

## 2022-11-30 DIAGNOSIS — R519 Headache, unspecified: Secondary | ICD-10-CM | POA: Diagnosis not present

## 2022-11-30 DIAGNOSIS — N2581 Secondary hyperparathyroidism of renal origin: Secondary | ICD-10-CM | POA: Diagnosis not present

## 2022-11-30 DIAGNOSIS — Z992 Dependence on renal dialysis: Secondary | ICD-10-CM | POA: Diagnosis not present

## 2022-11-30 DIAGNOSIS — D631 Anemia in chronic kidney disease: Secondary | ICD-10-CM | POA: Diagnosis not present

## 2022-11-30 DIAGNOSIS — E1129 Type 2 diabetes mellitus with other diabetic kidney complication: Secondary | ICD-10-CM | POA: Diagnosis not present

## 2022-11-30 DIAGNOSIS — D688 Other specified coagulation defects: Secondary | ICD-10-CM | POA: Diagnosis not present

## 2022-12-02 DIAGNOSIS — R519 Headache, unspecified: Secondary | ICD-10-CM | POA: Diagnosis not present

## 2022-12-02 DIAGNOSIS — N2581 Secondary hyperparathyroidism of renal origin: Secondary | ICD-10-CM | POA: Diagnosis not present

## 2022-12-02 DIAGNOSIS — D688 Other specified coagulation defects: Secondary | ICD-10-CM | POA: Diagnosis not present

## 2022-12-02 DIAGNOSIS — Z992 Dependence on renal dialysis: Secondary | ICD-10-CM | POA: Diagnosis not present

## 2022-12-02 DIAGNOSIS — N186 End stage renal disease: Secondary | ICD-10-CM | POA: Diagnosis not present

## 2022-12-02 DIAGNOSIS — E1129 Type 2 diabetes mellitus with other diabetic kidney complication: Secondary | ICD-10-CM | POA: Diagnosis not present

## 2022-12-02 DIAGNOSIS — D631 Anemia in chronic kidney disease: Secondary | ICD-10-CM | POA: Diagnosis not present

## 2022-12-05 DIAGNOSIS — N2581 Secondary hyperparathyroidism of renal origin: Secondary | ICD-10-CM | POA: Diagnosis not present

## 2022-12-05 DIAGNOSIS — E1129 Type 2 diabetes mellitus with other diabetic kidney complication: Secondary | ICD-10-CM | POA: Diagnosis not present

## 2022-12-05 DIAGNOSIS — Z992 Dependence on renal dialysis: Secondary | ICD-10-CM | POA: Diagnosis not present

## 2022-12-05 DIAGNOSIS — N186 End stage renal disease: Secondary | ICD-10-CM | POA: Diagnosis not present

## 2022-12-05 DIAGNOSIS — D688 Other specified coagulation defects: Secondary | ICD-10-CM | POA: Diagnosis not present

## 2022-12-05 DIAGNOSIS — D631 Anemia in chronic kidney disease: Secondary | ICD-10-CM | POA: Diagnosis not present

## 2022-12-05 DIAGNOSIS — R519 Headache, unspecified: Secondary | ICD-10-CM | POA: Diagnosis not present

## 2022-12-07 ENCOUNTER — Ambulatory Visit: Payer: Medicare Other | Admitting: Podiatry

## 2022-12-07 DIAGNOSIS — N186 End stage renal disease: Secondary | ICD-10-CM | POA: Diagnosis not present

## 2022-12-07 DIAGNOSIS — R519 Headache, unspecified: Secondary | ICD-10-CM | POA: Diagnosis not present

## 2022-12-07 DIAGNOSIS — N2581 Secondary hyperparathyroidism of renal origin: Secondary | ICD-10-CM | POA: Diagnosis not present

## 2022-12-07 DIAGNOSIS — E1129 Type 2 diabetes mellitus with other diabetic kidney complication: Secondary | ICD-10-CM | POA: Diagnosis not present

## 2022-12-07 DIAGNOSIS — Z992 Dependence on renal dialysis: Secondary | ICD-10-CM | POA: Diagnosis not present

## 2022-12-07 DIAGNOSIS — D631 Anemia in chronic kidney disease: Secondary | ICD-10-CM | POA: Diagnosis not present

## 2022-12-07 DIAGNOSIS — D688 Other specified coagulation defects: Secondary | ICD-10-CM | POA: Diagnosis not present

## 2022-12-09 DIAGNOSIS — D631 Anemia in chronic kidney disease: Secondary | ICD-10-CM | POA: Diagnosis not present

## 2022-12-09 DIAGNOSIS — N2581 Secondary hyperparathyroidism of renal origin: Secondary | ICD-10-CM | POA: Diagnosis not present

## 2022-12-09 DIAGNOSIS — D688 Other specified coagulation defects: Secondary | ICD-10-CM | POA: Diagnosis not present

## 2022-12-09 DIAGNOSIS — N186 End stage renal disease: Secondary | ICD-10-CM | POA: Diagnosis not present

## 2022-12-09 DIAGNOSIS — E1129 Type 2 diabetes mellitus with other diabetic kidney complication: Secondary | ICD-10-CM | POA: Diagnosis not present

## 2022-12-09 DIAGNOSIS — R519 Headache, unspecified: Secondary | ICD-10-CM | POA: Diagnosis not present

## 2022-12-09 DIAGNOSIS — Z992 Dependence on renal dialysis: Secondary | ICD-10-CM | POA: Diagnosis not present

## 2022-12-12 DIAGNOSIS — R519 Headache, unspecified: Secondary | ICD-10-CM | POA: Diagnosis not present

## 2022-12-12 DIAGNOSIS — N186 End stage renal disease: Secondary | ICD-10-CM | POA: Diagnosis not present

## 2022-12-12 DIAGNOSIS — E1129 Type 2 diabetes mellitus with other diabetic kidney complication: Secondary | ICD-10-CM | POA: Diagnosis not present

## 2022-12-12 DIAGNOSIS — D631 Anemia in chronic kidney disease: Secondary | ICD-10-CM | POA: Diagnosis not present

## 2022-12-12 DIAGNOSIS — Z992 Dependence on renal dialysis: Secondary | ICD-10-CM | POA: Diagnosis not present

## 2022-12-12 DIAGNOSIS — N2581 Secondary hyperparathyroidism of renal origin: Secondary | ICD-10-CM | POA: Diagnosis not present

## 2022-12-12 DIAGNOSIS — D688 Other specified coagulation defects: Secondary | ICD-10-CM | POA: Diagnosis not present

## 2022-12-14 DIAGNOSIS — Z992 Dependence on renal dialysis: Secondary | ICD-10-CM | POA: Diagnosis not present

## 2022-12-14 DIAGNOSIS — N186 End stage renal disease: Secondary | ICD-10-CM | POA: Diagnosis not present

## 2022-12-14 DIAGNOSIS — D688 Other specified coagulation defects: Secondary | ICD-10-CM | POA: Diagnosis not present

## 2022-12-14 DIAGNOSIS — E1129 Type 2 diabetes mellitus with other diabetic kidney complication: Secondary | ICD-10-CM | POA: Diagnosis not present

## 2022-12-14 DIAGNOSIS — D631 Anemia in chronic kidney disease: Secondary | ICD-10-CM | POA: Diagnosis not present

## 2022-12-14 DIAGNOSIS — R519 Headache, unspecified: Secondary | ICD-10-CM | POA: Diagnosis not present

## 2022-12-14 DIAGNOSIS — N2581 Secondary hyperparathyroidism of renal origin: Secondary | ICD-10-CM | POA: Diagnosis not present

## 2022-12-16 DIAGNOSIS — Z992 Dependence on renal dialysis: Secondary | ICD-10-CM | POA: Diagnosis not present

## 2022-12-16 DIAGNOSIS — R519 Headache, unspecified: Secondary | ICD-10-CM | POA: Diagnosis not present

## 2022-12-16 DIAGNOSIS — D631 Anemia in chronic kidney disease: Secondary | ICD-10-CM | POA: Diagnosis not present

## 2022-12-16 DIAGNOSIS — N2581 Secondary hyperparathyroidism of renal origin: Secondary | ICD-10-CM | POA: Diagnosis not present

## 2022-12-16 DIAGNOSIS — E1129 Type 2 diabetes mellitus with other diabetic kidney complication: Secondary | ICD-10-CM | POA: Diagnosis not present

## 2022-12-16 DIAGNOSIS — N186 End stage renal disease: Secondary | ICD-10-CM | POA: Diagnosis not present

## 2022-12-16 DIAGNOSIS — D688 Other specified coagulation defects: Secondary | ICD-10-CM | POA: Diagnosis not present

## 2022-12-19 ENCOUNTER — Other Ambulatory Visit: Payer: Self-pay | Admitting: Endocrinology

## 2022-12-19 DIAGNOSIS — E1151 Type 2 diabetes mellitus with diabetic peripheral angiopathy without gangrene: Secondary | ICD-10-CM

## 2022-12-19 DIAGNOSIS — N2581 Secondary hyperparathyroidism of renal origin: Secondary | ICD-10-CM | POA: Diagnosis not present

## 2022-12-19 DIAGNOSIS — D631 Anemia in chronic kidney disease: Secondary | ICD-10-CM | POA: Diagnosis not present

## 2022-12-19 DIAGNOSIS — N186 End stage renal disease: Secondary | ICD-10-CM | POA: Diagnosis not present

## 2022-12-19 DIAGNOSIS — R519 Headache, unspecified: Secondary | ICD-10-CM | POA: Diagnosis not present

## 2022-12-19 DIAGNOSIS — E1129 Type 2 diabetes mellitus with other diabetic kidney complication: Secondary | ICD-10-CM | POA: Diagnosis not present

## 2022-12-19 DIAGNOSIS — D688 Other specified coagulation defects: Secondary | ICD-10-CM | POA: Diagnosis not present

## 2022-12-19 DIAGNOSIS — Z992 Dependence on renal dialysis: Secondary | ICD-10-CM | POA: Diagnosis not present

## 2022-12-21 ENCOUNTER — Telehealth: Payer: Self-pay | Admitting: Endocrinology

## 2022-12-21 DIAGNOSIS — E1129 Type 2 diabetes mellitus with other diabetic kidney complication: Secondary | ICD-10-CM | POA: Diagnosis not present

## 2022-12-21 DIAGNOSIS — D631 Anemia in chronic kidney disease: Secondary | ICD-10-CM | POA: Diagnosis not present

## 2022-12-21 DIAGNOSIS — R519 Headache, unspecified: Secondary | ICD-10-CM | POA: Diagnosis not present

## 2022-12-21 DIAGNOSIS — N2581 Secondary hyperparathyroidism of renal origin: Secondary | ICD-10-CM | POA: Diagnosis not present

## 2022-12-21 DIAGNOSIS — D688 Other specified coagulation defects: Secondary | ICD-10-CM | POA: Diagnosis not present

## 2022-12-21 DIAGNOSIS — Z992 Dependence on renal dialysis: Secondary | ICD-10-CM | POA: Diagnosis not present

## 2022-12-21 DIAGNOSIS — N186 End stage renal disease: Secondary | ICD-10-CM | POA: Diagnosis not present

## 2022-12-21 NOTE — Telephone Encounter (Signed)
I called pharmacy. They confirmed Rx was received for G7. They gave patient wrong sensors. Pharmacist stated that they would call patient and have her bring G6 sensors back and they would exchange. I tried to call patient but voicemail not set up

## 2022-12-21 NOTE — Telephone Encounter (Signed)
Patient's daughter, Katie Garcia, came in to office saying that when she picked up Katie Garcia's sensor at the pharmacy that they gave her the Dexcom G6 and not the Dexcom G7. Pharmacy is   [id number] - [place], [place] (Ph: [phone])   Katie Garcia would like for the prescription that was sent in to be checked because [facility] says that [facility] told [facility] that the Dexcom G6 was the prescription that was sent in and not the Dexcom G7.  Ann picked up 2 sample boxes of the Dexcom G7 sensor in office today.

## 2022-12-23 DIAGNOSIS — E1129 Type 2 diabetes mellitus with other diabetic kidney complication: Secondary | ICD-10-CM | POA: Diagnosis not present

## 2022-12-23 DIAGNOSIS — R519 Headache, unspecified: Secondary | ICD-10-CM | POA: Diagnosis not present

## 2022-12-23 DIAGNOSIS — N186 End stage renal disease: Secondary | ICD-10-CM | POA: Diagnosis not present

## 2022-12-23 DIAGNOSIS — D688 Other specified coagulation defects: Secondary | ICD-10-CM | POA: Diagnosis not present

## 2022-12-23 DIAGNOSIS — N2581 Secondary hyperparathyroidism of renal origin: Secondary | ICD-10-CM | POA: Diagnosis not present

## 2022-12-23 DIAGNOSIS — Z992 Dependence on renal dialysis: Secondary | ICD-10-CM | POA: Diagnosis not present

## 2022-12-23 DIAGNOSIS — D631 Anemia in chronic kidney disease: Secondary | ICD-10-CM | POA: Diagnosis not present

## 2022-12-24 DIAGNOSIS — E1122 Type 2 diabetes mellitus with diabetic chronic kidney disease: Secondary | ICD-10-CM | POA: Diagnosis not present

## 2022-12-24 DIAGNOSIS — N186 End stage renal disease: Secondary | ICD-10-CM | POA: Diagnosis not present

## 2022-12-24 DIAGNOSIS — Z992 Dependence on renal dialysis: Secondary | ICD-10-CM | POA: Diagnosis not present

## 2022-12-26 DIAGNOSIS — Z992 Dependence on renal dialysis: Secondary | ICD-10-CM | POA: Diagnosis not present

## 2022-12-26 DIAGNOSIS — N2581 Secondary hyperparathyroidism of renal origin: Secondary | ICD-10-CM | POA: Diagnosis not present

## 2022-12-26 DIAGNOSIS — N186 End stage renal disease: Secondary | ICD-10-CM | POA: Diagnosis not present

## 2022-12-26 DIAGNOSIS — R519 Headache, unspecified: Secondary | ICD-10-CM | POA: Diagnosis not present

## 2022-12-26 DIAGNOSIS — D631 Anemia in chronic kidney disease: Secondary | ICD-10-CM | POA: Diagnosis not present

## 2022-12-26 DIAGNOSIS — D688 Other specified coagulation defects: Secondary | ICD-10-CM | POA: Diagnosis not present

## 2022-12-28 DIAGNOSIS — Z992 Dependence on renal dialysis: Secondary | ICD-10-CM | POA: Diagnosis not present

## 2022-12-28 DIAGNOSIS — N186 End stage renal disease: Secondary | ICD-10-CM | POA: Diagnosis not present

## 2022-12-28 DIAGNOSIS — R519 Headache, unspecified: Secondary | ICD-10-CM | POA: Diagnosis not present

## 2022-12-28 DIAGNOSIS — D631 Anemia in chronic kidney disease: Secondary | ICD-10-CM | POA: Diagnosis not present

## 2022-12-28 DIAGNOSIS — N2581 Secondary hyperparathyroidism of renal origin: Secondary | ICD-10-CM | POA: Diagnosis not present

## 2022-12-28 DIAGNOSIS — D688 Other specified coagulation defects: Secondary | ICD-10-CM | POA: Diagnosis not present

## 2022-12-30 DIAGNOSIS — Z992 Dependence on renal dialysis: Secondary | ICD-10-CM | POA: Diagnosis not present

## 2022-12-30 DIAGNOSIS — D688 Other specified coagulation defects: Secondary | ICD-10-CM | POA: Diagnosis not present

## 2022-12-30 DIAGNOSIS — N186 End stage renal disease: Secondary | ICD-10-CM | POA: Diagnosis not present

## 2022-12-30 DIAGNOSIS — N2581 Secondary hyperparathyroidism of renal origin: Secondary | ICD-10-CM | POA: Diagnosis not present

## 2022-12-30 DIAGNOSIS — D631 Anemia in chronic kidney disease: Secondary | ICD-10-CM | POA: Diagnosis not present

## 2022-12-30 DIAGNOSIS — R519 Headache, unspecified: Secondary | ICD-10-CM | POA: Diagnosis not present

## 2023-01-02 DIAGNOSIS — D631 Anemia in chronic kidney disease: Secondary | ICD-10-CM | POA: Diagnosis not present

## 2023-01-02 DIAGNOSIS — N186 End stage renal disease: Secondary | ICD-10-CM | POA: Diagnosis not present

## 2023-01-02 DIAGNOSIS — N2581 Secondary hyperparathyroidism of renal origin: Secondary | ICD-10-CM | POA: Diagnosis not present

## 2023-01-02 DIAGNOSIS — D688 Other specified coagulation defects: Secondary | ICD-10-CM | POA: Diagnosis not present

## 2023-01-02 DIAGNOSIS — Z992 Dependence on renal dialysis: Secondary | ICD-10-CM | POA: Diagnosis not present

## 2023-01-02 DIAGNOSIS — R519 Headache, unspecified: Secondary | ICD-10-CM | POA: Diagnosis not present

## 2023-01-04 DIAGNOSIS — R519 Headache, unspecified: Secondary | ICD-10-CM | POA: Diagnosis not present

## 2023-01-04 DIAGNOSIS — N186 End stage renal disease: Secondary | ICD-10-CM | POA: Diagnosis not present

## 2023-01-04 DIAGNOSIS — D688 Other specified coagulation defects: Secondary | ICD-10-CM | POA: Diagnosis not present

## 2023-01-04 DIAGNOSIS — Z992 Dependence on renal dialysis: Secondary | ICD-10-CM | POA: Diagnosis not present

## 2023-01-04 DIAGNOSIS — N2581 Secondary hyperparathyroidism of renal origin: Secondary | ICD-10-CM | POA: Diagnosis not present

## 2023-01-04 DIAGNOSIS — D631 Anemia in chronic kidney disease: Secondary | ICD-10-CM | POA: Diagnosis not present

## 2023-01-06 DIAGNOSIS — D688 Other specified coagulation defects: Secondary | ICD-10-CM | POA: Diagnosis not present

## 2023-01-06 DIAGNOSIS — Z992 Dependence on renal dialysis: Secondary | ICD-10-CM | POA: Diagnosis not present

## 2023-01-06 DIAGNOSIS — N2581 Secondary hyperparathyroidism of renal origin: Secondary | ICD-10-CM | POA: Diagnosis not present

## 2023-01-06 DIAGNOSIS — R519 Headache, unspecified: Secondary | ICD-10-CM | POA: Diagnosis not present

## 2023-01-06 DIAGNOSIS — N186 End stage renal disease: Secondary | ICD-10-CM | POA: Diagnosis not present

## 2023-01-06 DIAGNOSIS — D631 Anemia in chronic kidney disease: Secondary | ICD-10-CM | POA: Diagnosis not present

## 2023-01-09 DIAGNOSIS — D688 Other specified coagulation defects: Secondary | ICD-10-CM | POA: Diagnosis not present

## 2023-01-09 DIAGNOSIS — Z992 Dependence on renal dialysis: Secondary | ICD-10-CM | POA: Diagnosis not present

## 2023-01-09 DIAGNOSIS — N2581 Secondary hyperparathyroidism of renal origin: Secondary | ICD-10-CM | POA: Diagnosis not present

## 2023-01-09 DIAGNOSIS — D631 Anemia in chronic kidney disease: Secondary | ICD-10-CM | POA: Diagnosis not present

## 2023-01-09 DIAGNOSIS — R519 Headache, unspecified: Secondary | ICD-10-CM | POA: Diagnosis not present

## 2023-01-09 DIAGNOSIS — N186 End stage renal disease: Secondary | ICD-10-CM | POA: Diagnosis not present

## 2023-01-11 DIAGNOSIS — R519 Headache, unspecified: Secondary | ICD-10-CM | POA: Diagnosis not present

## 2023-01-11 DIAGNOSIS — Z992 Dependence on renal dialysis: Secondary | ICD-10-CM | POA: Diagnosis not present

## 2023-01-11 DIAGNOSIS — N2581 Secondary hyperparathyroidism of renal origin: Secondary | ICD-10-CM | POA: Diagnosis not present

## 2023-01-11 DIAGNOSIS — D631 Anemia in chronic kidney disease: Secondary | ICD-10-CM | POA: Diagnosis not present

## 2023-01-11 DIAGNOSIS — N186 End stage renal disease: Secondary | ICD-10-CM | POA: Diagnosis not present

## 2023-01-11 DIAGNOSIS — D688 Other specified coagulation defects: Secondary | ICD-10-CM | POA: Diagnosis not present

## 2023-01-12 ENCOUNTER — Ambulatory Visit
Admission: RE | Admit: 2023-01-12 | Discharge: 2023-01-12 | Disposition: A | Discharge status: Home / Self Care | Payer: Medicare Other | Source: Ambulatory Visit | Attending: Internal Medicine | Admitting: Internal Medicine

## 2023-01-12 ENCOUNTER — Other Ambulatory Visit: Payer: Self-pay | Admitting: Internal Medicine

## 2023-01-12 DIAGNOSIS — J411 Mucopurulent chronic bronchitis: Secondary | ICD-10-CM | POA: Diagnosis not present

## 2023-01-12 DIAGNOSIS — N2581 Secondary hyperparathyroidism of renal origin: Secondary | ICD-10-CM | POA: Diagnosis not present

## 2023-01-12 DIAGNOSIS — R059 Cough, unspecified: Secondary | ICD-10-CM | POA: Diagnosis not present

## 2023-01-12 DIAGNOSIS — N186 End stage renal disease: Secondary | ICD-10-CM | POA: Diagnosis not present

## 2023-01-12 DIAGNOSIS — I770 Arteriovenous fistula, acquired: Secondary | ICD-10-CM | POA: Diagnosis not present

## 2023-01-12 DIAGNOSIS — I10 Essential (primary) hypertension: Secondary | ICD-10-CM | POA: Diagnosis not present

## 2023-01-12 DIAGNOSIS — Z992 Dependence on renal dialysis: Secondary | ICD-10-CM | POA: Diagnosis not present

## 2023-01-12 DIAGNOSIS — E1122 Type 2 diabetes mellitus with diabetic chronic kidney disease: Secondary | ICD-10-CM | POA: Diagnosis not present

## 2023-01-13 DIAGNOSIS — D688 Other specified coagulation defects: Secondary | ICD-10-CM | POA: Diagnosis not present

## 2023-01-13 DIAGNOSIS — N2581 Secondary hyperparathyroidism of renal origin: Secondary | ICD-10-CM | POA: Diagnosis not present

## 2023-01-13 DIAGNOSIS — Z992 Dependence on renal dialysis: Secondary | ICD-10-CM | POA: Diagnosis not present

## 2023-01-13 DIAGNOSIS — N186 End stage renal disease: Secondary | ICD-10-CM | POA: Diagnosis not present

## 2023-01-13 DIAGNOSIS — D631 Anemia in chronic kidney disease: Secondary | ICD-10-CM | POA: Diagnosis not present

## 2023-01-13 DIAGNOSIS — R519 Headache, unspecified: Secondary | ICD-10-CM | POA: Diagnosis not present

## 2023-01-16 DIAGNOSIS — N186 End stage renal disease: Secondary | ICD-10-CM | POA: Diagnosis not present

## 2023-01-16 DIAGNOSIS — R519 Headache, unspecified: Secondary | ICD-10-CM | POA: Diagnosis not present

## 2023-01-16 DIAGNOSIS — N2581 Secondary hyperparathyroidism of renal origin: Secondary | ICD-10-CM | POA: Diagnosis not present

## 2023-01-16 DIAGNOSIS — D688 Other specified coagulation defects: Secondary | ICD-10-CM | POA: Diagnosis not present

## 2023-01-16 DIAGNOSIS — Z992 Dependence on renal dialysis: Secondary | ICD-10-CM | POA: Diagnosis not present

## 2023-01-16 DIAGNOSIS — D631 Anemia in chronic kidney disease: Secondary | ICD-10-CM | POA: Diagnosis not present

## 2023-01-18 DIAGNOSIS — D688 Other specified coagulation defects: Secondary | ICD-10-CM | POA: Diagnosis not present

## 2023-01-18 DIAGNOSIS — N2581 Secondary hyperparathyroidism of renal origin: Secondary | ICD-10-CM | POA: Diagnosis not present

## 2023-01-18 DIAGNOSIS — R519 Headache, unspecified: Secondary | ICD-10-CM | POA: Diagnosis not present

## 2023-01-18 DIAGNOSIS — D631 Anemia in chronic kidney disease: Secondary | ICD-10-CM | POA: Diagnosis not present

## 2023-01-18 DIAGNOSIS — Z992 Dependence on renal dialysis: Secondary | ICD-10-CM | POA: Diagnosis not present

## 2023-01-18 DIAGNOSIS — N186 End stage renal disease: Secondary | ICD-10-CM | POA: Diagnosis not present

## 2023-01-20 DIAGNOSIS — R519 Headache, unspecified: Secondary | ICD-10-CM | POA: Diagnosis not present

## 2023-01-20 DIAGNOSIS — Z992 Dependence on renal dialysis: Secondary | ICD-10-CM | POA: Diagnosis not present

## 2023-01-20 DIAGNOSIS — D688 Other specified coagulation defects: Secondary | ICD-10-CM | POA: Diagnosis not present

## 2023-01-20 DIAGNOSIS — N2581 Secondary hyperparathyroidism of renal origin: Secondary | ICD-10-CM | POA: Diagnosis not present

## 2023-01-20 DIAGNOSIS — N186 End stage renal disease: Secondary | ICD-10-CM | POA: Diagnosis not present

## 2023-01-20 DIAGNOSIS — D631 Anemia in chronic kidney disease: Secondary | ICD-10-CM | POA: Diagnosis not present

## 2023-01-23 DIAGNOSIS — R519 Headache, unspecified: Secondary | ICD-10-CM | POA: Diagnosis not present

## 2023-01-23 DIAGNOSIS — D631 Anemia in chronic kidney disease: Secondary | ICD-10-CM | POA: Diagnosis not present

## 2023-01-23 DIAGNOSIS — Z992 Dependence on renal dialysis: Secondary | ICD-10-CM | POA: Diagnosis not present

## 2023-01-23 DIAGNOSIS — D688 Other specified coagulation defects: Secondary | ICD-10-CM | POA: Diagnosis not present

## 2023-01-23 DIAGNOSIS — N186 End stage renal disease: Secondary | ICD-10-CM | POA: Diagnosis not present

## 2023-01-23 DIAGNOSIS — N2581 Secondary hyperparathyroidism of renal origin: Secondary | ICD-10-CM | POA: Diagnosis not present

## 2023-01-24 ENCOUNTER — Ambulatory Visit (INDEPENDENT_AMBULATORY_CARE_PROVIDER_SITE_OTHER): Payer: Medicare Other | Admitting: Podiatry

## 2023-01-24 ENCOUNTER — Encounter: Payer: Self-pay | Admitting: Podiatry

## 2023-01-24 VITALS — BP 134/68

## 2023-01-24 DIAGNOSIS — M79675 Pain in left toe(s): Secondary | ICD-10-CM

## 2023-01-24 DIAGNOSIS — Z794 Long term (current) use of insulin: Secondary | ICD-10-CM | POA: Diagnosis not present

## 2023-01-24 DIAGNOSIS — E0822 Diabetes mellitus due to underlying condition with diabetic chronic kidney disease: Secondary | ICD-10-CM

## 2023-01-24 DIAGNOSIS — Z992 Dependence on renal dialysis: Secondary | ICD-10-CM

## 2023-01-24 DIAGNOSIS — M79674 Pain in right toe(s): Secondary | ICD-10-CM

## 2023-01-24 DIAGNOSIS — N186 End stage renal disease: Secondary | ICD-10-CM

## 2023-01-24 DIAGNOSIS — B351 Tinea unguium: Secondary | ICD-10-CM | POA: Diagnosis not present

## 2023-01-24 DIAGNOSIS — E1122 Type 2 diabetes mellitus with diabetic chronic kidney disease: Secondary | ICD-10-CM | POA: Diagnosis not present

## 2023-01-24 DIAGNOSIS — L84 Corns and callosities: Secondary | ICD-10-CM

## 2023-01-24 NOTE — Progress Notes (Signed)
  Subjective:  Patient ID: Katie Garcia, female    DOB: March 08, 1941,  MRN: [id number]  Katie Garcia presents to clinic today for at risk foot care with h/o NIDDM with ESRD on hemodialysis and corn(s) right lower extremity, callus(es) left lower extremity and painful mycotic nails.  Pain interferes with ambulation. Aggravating factors include wearing enclosed shoe gear. Painful toenails interfere with ambulation. Aggravating factors include wearing enclosed shoe gear. Pain is relieved with periodic professional debridement. Painful corns and calluses are aggravated when weightbearing with and without shoegear. Pain is relieved with periodic professional debridement.  Chief Complaint  Patient presents with   Nail Problem     Routine foot care   New problem(s): None.   PCP is Katie Cha, MD.  Allergies  Allergen Reactions   Penicillins Rash    Did it involve swelling of the face/tongue/throat, SOB, or low BP? No Did it involve sudden or severe rash/hives, skin peeling, or any reaction on the inside of your mouth or nose? Yes Did you need to seek medical attention at a hospital or doctor's office? N/A When did it last happen? N/A      If all above answers are "NO", may proceed with cephalosporin use.    Review of Systems: Negative except as noted in the HPI.  Objective:  Vitals:   01/24/23 1009  BP: 134/68   Katie Garcia is a pleasant 82 y.o. female obese in NAD. AAO x 3.  Vascular CFT <3 seconds b/l LE. Palpable DP pulse(s) b/l LE. Faintly palpable PT pulse(s) b/l LE. Pedal hair absent. No pain with calf compression b/l. Trace edema noted BLE.  Neurologic Normal speech. Oriented to person, place, and time. Protective sensation intact 5/5 intact bilaterally with 10g monofilament b/l. Vibratory sensation intact b/l.  Dermatologic Pedal integument with normal turgor, texture and tone b/l LE. No open wounds b/l. No interdigital macerations b/l. Toenails 1-5 b/l elongated,  thickened, discolored with subungual debris. +Tenderness with dorsal palpation of nailplates. Hyperkeratotic lesion(s) noted submet head 5 LLE and distal tip of right 3rd toe.  Orthopedic: Normal muscle strength 5/5 to all lower extremity muscle groups bilaterally. Pes planus deformity noted bilateral LE.Marland KitchenNo pain, crepitus or joint limitation noted with ROM b/l LE.  Patient ambulates independently without assistive aids.   Radiographs: None  Assessment/Plan: 1. Pain due to onychomycosis of toenails of both feet   2. Corns and callosities   3. Diabetes mellitus due to underlying condition with chronic kidney disease on chronic dialysis, with long-term current use of insulin ([name])   -Patient was evaluated and treated. All patient's and/or POA's questions/concerns answered on today's visit. -Continue supportive shoe gear daily. -Mycotic toenails 1-5 bilaterally were debrided in length and girth with sterile nail nippers and dremel without incident. -Corn(s) R 3rd toe and callus(es) submet head 5 left foot were pared utilizing sterile scalpel blade without incident. Total number debrided =2. -Patient/POA to call should there be question/concern in the interim.   Return in about 3 months (around 04/24/2023).  Marzetta Board, DPM

## 2023-01-25 DIAGNOSIS — N186 End stage renal disease: Secondary | ICD-10-CM | POA: Diagnosis not present

## 2023-01-25 DIAGNOSIS — Z992 Dependence on renal dialysis: Secondary | ICD-10-CM | POA: Diagnosis not present

## 2023-01-25 DIAGNOSIS — E1129 Type 2 diabetes mellitus with other diabetic kidney complication: Secondary | ICD-10-CM | POA: Diagnosis not present

## 2023-01-25 DIAGNOSIS — D688 Other specified coagulation defects: Secondary | ICD-10-CM | POA: Diagnosis not present

## 2023-01-25 DIAGNOSIS — D631 Anemia in chronic kidney disease: Secondary | ICD-10-CM | POA: Diagnosis not present

## 2023-01-25 DIAGNOSIS — R519 Headache, unspecified: Secondary | ICD-10-CM | POA: Diagnosis not present

## 2023-01-25 DIAGNOSIS — N2581 Secondary hyperparathyroidism of renal origin: Secondary | ICD-10-CM | POA: Diagnosis not present

## 2023-01-27 DIAGNOSIS — Z992 Dependence on renal dialysis: Secondary | ICD-10-CM | POA: Diagnosis not present

## 2023-01-27 DIAGNOSIS — D688 Other specified coagulation defects: Secondary | ICD-10-CM | POA: Diagnosis not present

## 2023-01-27 DIAGNOSIS — E1129 Type 2 diabetes mellitus with other diabetic kidney complication: Secondary | ICD-10-CM | POA: Diagnosis not present

## 2023-01-27 DIAGNOSIS — N2581 Secondary hyperparathyroidism of renal origin: Secondary | ICD-10-CM | POA: Diagnosis not present

## 2023-01-27 DIAGNOSIS — N186 End stage renal disease: Secondary | ICD-10-CM | POA: Diagnosis not present

## 2023-01-27 DIAGNOSIS — D631 Anemia in chronic kidney disease: Secondary | ICD-10-CM | POA: Diagnosis not present

## 2023-01-27 DIAGNOSIS — R519 Headache, unspecified: Secondary | ICD-10-CM | POA: Diagnosis not present

## 2023-01-30 DIAGNOSIS — D688 Other specified coagulation defects: Secondary | ICD-10-CM | POA: Diagnosis not present

## 2023-01-30 DIAGNOSIS — R519 Headache, unspecified: Secondary | ICD-10-CM | POA: Diagnosis not present

## 2023-01-30 DIAGNOSIS — N186 End stage renal disease: Secondary | ICD-10-CM | POA: Diagnosis not present

## 2023-01-30 DIAGNOSIS — Z992 Dependence on renal dialysis: Secondary | ICD-10-CM | POA: Diagnosis not present

## 2023-01-30 DIAGNOSIS — E1129 Type 2 diabetes mellitus with other diabetic kidney complication: Secondary | ICD-10-CM | POA: Diagnosis not present

## 2023-01-30 DIAGNOSIS — D631 Anemia in chronic kidney disease: Secondary | ICD-10-CM | POA: Diagnosis not present

## 2023-01-30 DIAGNOSIS — N2581 Secondary hyperparathyroidism of renal origin: Secondary | ICD-10-CM | POA: Diagnosis not present

## 2023-02-01 DIAGNOSIS — D631 Anemia in chronic kidney disease: Secondary | ICD-10-CM | POA: Diagnosis not present

## 2023-02-01 DIAGNOSIS — N2581 Secondary hyperparathyroidism of renal origin: Secondary | ICD-10-CM | POA: Diagnosis not present

## 2023-02-01 DIAGNOSIS — Z992 Dependence on renal dialysis: Secondary | ICD-10-CM | POA: Diagnosis not present

## 2023-02-01 DIAGNOSIS — R519 Headache, unspecified: Secondary | ICD-10-CM | POA: Diagnosis not present

## 2023-02-01 DIAGNOSIS — E1129 Type 2 diabetes mellitus with other diabetic kidney complication: Secondary | ICD-10-CM | POA: Diagnosis not present

## 2023-02-01 DIAGNOSIS — D688 Other specified coagulation defects: Secondary | ICD-10-CM | POA: Diagnosis not present

## 2023-02-01 DIAGNOSIS — N186 End stage renal disease: Secondary | ICD-10-CM | POA: Diagnosis not present

## 2023-02-02 ENCOUNTER — Other Ambulatory Visit (INDEPENDENT_AMBULATORY_CARE_PROVIDER_SITE_OTHER): Payer: Medicare Other

## 2023-02-02 DIAGNOSIS — Z794 Long term (current) use of insulin: Secondary | ICD-10-CM | POA: Diagnosis not present

## 2023-02-02 DIAGNOSIS — E1165 Type 2 diabetes mellitus with hyperglycemia: Secondary | ICD-10-CM | POA: Diagnosis not present

## 2023-02-02 LAB — HEMOGLOBIN A1C: Hgb A1c MFr Bld: 7.3 % — ABNORMAL HIGH (ref 4.6–6.5)

## 2023-02-02 LAB — GLUCOSE, RANDOM: Glucose, Bld: 187 mg/dL — ABNORMAL HIGH (ref 70–99)

## 2023-02-03 DIAGNOSIS — D631 Anemia in chronic kidney disease: Secondary | ICD-10-CM | POA: Diagnosis not present

## 2023-02-03 DIAGNOSIS — R519 Headache, unspecified: Secondary | ICD-10-CM | POA: Diagnosis not present

## 2023-02-03 DIAGNOSIS — D688 Other specified coagulation defects: Secondary | ICD-10-CM | POA: Diagnosis not present

## 2023-02-03 DIAGNOSIS — E1129 Type 2 diabetes mellitus with other diabetic kidney complication: Secondary | ICD-10-CM | POA: Diagnosis not present

## 2023-02-03 DIAGNOSIS — N186 End stage renal disease: Secondary | ICD-10-CM | POA: Diagnosis not present

## 2023-02-03 DIAGNOSIS — N2581 Secondary hyperparathyroidism of renal origin: Secondary | ICD-10-CM | POA: Diagnosis not present

## 2023-02-03 DIAGNOSIS — Z992 Dependence on renal dialysis: Secondary | ICD-10-CM | POA: Diagnosis not present

## 2023-02-06 DIAGNOSIS — N2581 Secondary hyperparathyroidism of renal origin: Secondary | ICD-10-CM | POA: Diagnosis not present

## 2023-02-06 DIAGNOSIS — D688 Other specified coagulation defects: Secondary | ICD-10-CM | POA: Diagnosis not present

## 2023-02-06 DIAGNOSIS — E1129 Type 2 diabetes mellitus with other diabetic kidney complication: Secondary | ICD-10-CM | POA: Diagnosis not present

## 2023-02-06 DIAGNOSIS — N186 End stage renal disease: Secondary | ICD-10-CM | POA: Diagnosis not present

## 2023-02-06 DIAGNOSIS — D631 Anemia in chronic kidney disease: Secondary | ICD-10-CM | POA: Diagnosis not present

## 2023-02-06 DIAGNOSIS — Z992 Dependence on renal dialysis: Secondary | ICD-10-CM | POA: Diagnosis not present

## 2023-02-06 DIAGNOSIS — R519 Headache, unspecified: Secondary | ICD-10-CM | POA: Diagnosis not present

## 2023-02-07 ENCOUNTER — Encounter: Payer: Self-pay | Admitting: Endocrinology

## 2023-02-07 ENCOUNTER — Ambulatory Visit (INDEPENDENT_AMBULATORY_CARE_PROVIDER_SITE_OTHER): Payer: Medicare Other | Admitting: Endocrinology

## 2023-02-07 VITALS — BP 112/76 | Ht 60.0 in | Wt 197.6 lb

## 2023-02-07 DIAGNOSIS — Z794 Long term (current) use of insulin: Secondary | ICD-10-CM

## 2023-02-07 DIAGNOSIS — E1165 Type 2 diabetes mellitus with hyperglycemia: Secondary | ICD-10-CM

## 2023-02-07 MED ORDER — INSULIN LISPRO (1 UNIT DIAL) 100 UNIT/ML (KWIKPEN): PEN_INJECTOR | SUBCUTANEOUS | 1 refills | Status: DC

## 2023-02-07 NOTE — Progress Notes (Signed)
Patient ID: Katie Garcia, female   DOB: [birth year], 82 y.o.   MRN: [id number]           Reason for Appointment: Type II Diabetes follow-up   History of Present Illness   Diagnosis date: 1980  Previous history:  Non-insulin hypoglycemic drugs previously used: Unknown Insulin was started soon after diagnosis, previously had been on Levemir, Tresiba, Lantus and NovoLog  A1c range in the last few years is: 7.1-11.1  Recent history:         Insulin regimen: 20 Lantus, 18 R at breakfast           Side effects from medications: None  Current self management, blood sugar patterns and problems identified:  A1c is 7.3 compared to 9  She was started on Lantus instead of NPH in October With this her blood sugars overall significantly better Also fasting readings are improved Blood sugars appear to be fairly significantly high after her first meal which is usually late morning or when she is going for dialysis in the early afternoon Only occasionally her blood sugars are not significantly high after breakfast despite taking 18 units of regular insulin She appears to be taking her insulin late after eating Also will frequently have relatively higher fat meals with meat at breakfast time She is not eating out as much Only when she is significantly fatigued and not eating much during the day her blood sugars are relatively better She was told to take regular insulin before dinner also but she is not doing so but currently blood sugars are not rising much in the evening  Diet management: Usually avoiding sweets and drinks with sugar, eating fast food regularly  Breakfast at 9-10 am, lunch 2-3 p.m., dinner 6-7 pm     Interpretation of her Dexcom download shows the following On an average blood sugars are above the target range only between about 9 AM-2 PM but otherwise within target all times Overnight blood sugars are mildly increased but significantly variable and FASTING blood sugar is  as low as 140 3 in the morning average POSTPRANDIAL readings are usually significantly high midday after the first meal and averaging at the highest level about 247 before noon; blood sugars appear to be significantly higher in the second week and less prolonged Blood sugars before dinner are usually in the mid target range and no hypoglycemia except occasional low normal readings Postprandial readings after dinner are on an average not rising significantly Hypoglycemia has been minimal with likely some falsely low readings overnight and rarely slightly low readings before dinner Time in target is much better than on her last visit when it was only 20%  CGM use % of time   2-week average/GV 164/38  Time in range        67%  % Time Above 180 20  % Time above 250 10  % Time Below 70 2     Previously:  CGM use % of time 86  2-week average/GV 252  Time in range 20      %  % Time Above 180 28  % Time above 250 51  % Time Below 70      Glucometer: Accu-Chek  Blood Glucose readings previously from recall  PRE-MEAL Fasting Lunch Dinner Bedtime Overall  Glucose range: About 170   About 200   Mean/median:         Dietician visit: Most recent:  2019    Weight control:  Wt Readings from Last  3 Encounters:  02/07/23 197 lb 9.6 oz (89.6 kg)  10/20/22 192 lb (87.1 kg)  09/13/22 201 lb (91.2 kg)         Diabetes labs:  Lab Results  Component Value Date   HGBA1C 7.3 (H) 02/02/2023   HGBA1C 9.0 (H) 10/18/2022   HGBA1C 7.7 (H) 07/17/2022   Lab Results  Component Value Date   LDLCALC 83 07/17/2022   CREATININE 7.10 (H) 06/23/2022    Lab Results  Component Value Date   FRUCTOSAMINE 408 (H) 08/29/2019   Other active problems addressed today: See review of systems  Allergies as of 02/07/2023       Reactions   Penicillins Rash   Did it involve swelling of the face/tongue/throat, SOB, or low BP? No Did it involve sudden or severe rash/hives, skin peeling, or any reaction  on the inside of your mouth or nose? Yes Did you need to seek medical attention at a hospital or doctor's office? N/A When did it last happen? N/A      If all above answers are "NO", may proceed with cephalosporin use.        Medication List        Accurate as of February 07, 2023 10:34 AM. If you have any questions, ask your nurse or doctor.          Accu-Chek FastClix Lancets Misc   Accu-Chek Guide test strip Generic drug: glucose blood 1 each by Other route in the morning and at bedtime. E11.22   Accu-Chek Guide w/Device Kit   acetaminophen 500 MG tablet Commonly known as: TYLENOL Take 500-1,000 mg by mouth every 6 (six) hours as needed for moderate pain or headache.   allopurinol 100 MG tablet Commonly known as: ZYLOPRIM Take 100 mg by mouth Every Tuesday,Thursday,and Saturday with dialysis. After hemodialysis   calcium acetate 667 MG capsule Commonly known as: PHOSLO Take 1,334 mg by mouth 3 (three) times daily with meals.   carvedilol 25 MG tablet Commonly known as: COREG Take 25 mg by mouth in the morning and at bedtime.   Dexcom G7 Sensor Misc CHANGE EVERY 10 DAYS   Dialyvite 800-Zinc 15 0.8 MG Tabs Take 1 tablet by mouth daily with supper.   famotidine 20 MG tablet Commonly known as: PEPCID Take 20 mg by mouth daily.   guaifenesin 100 MG/5ML syrup Commonly known as: ROBITUSSIN Take 100 mg by mouth 3 (three) times daily as needed for cough.   Insulin Pen Needle 31G X 5 MM Misc Use for insulin   insulin regular 100 units/mL injection Commonly known as: NovoLIN R Inject 0.15 mLs (15 Units total) into the skin daily. And syringes 1/day What changed: how much to take   Lantus SoloStar 100 UNIT/ML Solostar Pen Generic drug: insulin glargine Inject 30 Units into the skin daily. Adjust as directed What changed: additional instructions   loratadine 10 MG tablet Commonly known as: CLARITIN Take 10 mg by mouth daily.   meclizine 25 MG  tablet Commonly known as: ANTIVERT Take by mouth.   oxyCODONE-acetaminophen 5-325 MG tablet Commonly known as: Percocet Take 1 tablet by mouth every 6 (six) hours as needed for severe pain.   RELION INSULIN SYR 0.5ML/31G 31G X 5/16" 0.5 ML Misc Generic drug: Insulin Syringe-Needle U-100 USE 4 times DAILY   rosuvastatin 10 MG tablet Commonly known as: CRESTOR Take 10 mg by mouth in the morning.        Allergies:  Allergies  Allergen Reactions   Penicillins  Rash    Did it involve swelling of the face/tongue/throat, SOB, or low BP? No Did it involve sudden or severe rash/hives, skin peeling, or any reaction on the inside of your mouth or nose? Yes Did you need to seek medical attention at a hospital or doctor's office? N/A When did it last happen? N/A      If all above answers are "NO", may proceed with cephalosporin use.    Past Medical History:  Diagnosis Date   Anemia    Arthritis    COVID    hospitalized   Diabetes mellitus without complication ([place])    ESRD    Dialysis T/Th/Sa at [place]   GERD (gastroesophageal reflux disease)    Gout    Headache    Hypertension    Type 2 diabetes [facility])     Past Surgical History:  Procedure Laterality Date   A/V FISTULAGRAM Left 04/05/2022   Procedure: A/V Fistulagram;  Surgeon: Broadus John, MD;  Location: [facility];  Service: Cardiovascular;  Laterality: Left;   ABDOMINAL HYSTERECTOMY     AV FISTULA PLACEMENT Left 10/02/2016   Procedure: ARTERIOVENOUS (AV) FISTULA CREATION LEFT UPPER ARM;  Surgeon: Waynetta Sandy, MD;  Location: [place];  Service: Vascular;  Laterality: Left;   AV FISTULA PLACEMENT Right 07/14/2020   Procedure: RIGHT ARM ARTERIOVENOUS FISTULA CREATION;  Surgeon: Waynetta Sandy, MD;  Location: [place];  Service: Vascular;  Laterality: Right;   AV FISTULA PLACEMENT Left 05/16/2021   Procedure: INSERTION OF LEFT UPPER ARM ARTERIOVENOUS (AV) GORE-TEX GRAFT;  Surgeon:  Marty Heck, MD;  Location: [place];  Service: Vascular;  Laterality: Left;   AV FISTULA PLACEMENT Right 07/13/2021   Procedure: RIGHT ARM ARTERIOVENOUS (AV) FISTULA GRAFT INSERTION;  Surgeon: Waynetta Sandy, MD;  Location: [employer];  Service: Vascular;  Laterality: Right;   AV FISTULA PLACEMENT Left 02/03/2022   Procedure: LEFT UPPER ARM ARTERIOVENOUS FISTULA CREATION;  Surgeon: Waynetta Sandy, MD;  Location: [place];  Service: Vascular;  Laterality: Left;   AV FISTULA PLACEMENT Left 06/23/2022   Procedure: INSERTION OF LEFT ARM ARTERIOVENOUS (AV) GORE-TEX GRAFT;  Surgeon: Waynetta Sandy, MD;  Location: [place];  Service: Vascular;  Laterality: Left;   [place] Left 05/17/2020   Procedure: Insertion of Left arm arteriovenous gortex graftarm ;  Surgeon: Angelia Mould, MD;  Location: [place];  Service: Vascular;  Laterality: Left;   CESAREAN SECTION     DIALYSIS/PERMA CATHETER INSERTION     IR FLUORO GUIDE CV LINE LEFT  02/09/2022   IR PTA VENOUS EXCEPT DIALYSIS CIRCUIT  02/09/2022   IR VENOCAVAGRAM SVC  02/09/2022   REVISON OF ARTERIOVENOUS FISTULA Right 09/15/2020   Procedure: REVISON OF RIGHT UPPER ARM  ARTERIOVENOUS FISTULA;  Surgeon: Waynetta Sandy, MD;  Location: [place];  Service: Vascular;  Laterality: Right;   UPPER EXTREMITY VENOGRAPHY Bilateral 04/18/2021   Procedure: UPPER EXTREMITY VENOGRAPHY;  Surgeon: Waynetta Sandy, MD;  Location: [place] [facility];  Service: Cardiovascular;  Laterality: Bilateral;   UPPER EXTREMITY VENOGRAPHY Left 01/23/2022   Procedure: UPPER EXTREMITY VENOGRAPHY;  Surgeon: Waynetta Sandy, MD;  Location: [place] [facility];  Service: Cardiovascular;  Laterality: Left;    Family History  Problem Relation Age of Onset   Diabetes Mother     Social History:  reports that she has never smoked. She has never used smokeless tobacco. She reports that she does not currently use  alcohol. She reports that she does not use drugs.  Review of Systems:  Last diabetic eye exam date [place], needs follow-up  Last foot exam date: 7/23  Symptoms of neuropathy: None She has mild peripheral vascular disease  Hypertension:   Treatment includes carvedilol  BP Readings from Last 3 Encounters:  02/07/23 112/76  01/24/23 134/68  10/20/22 104/68    Lipid management: Treated with Crestor 59m  Last LDL below 100    Lab Results  Component Value Date   CHOL 156 07/17/2022   Lab Results  Component Value Date   HDL 59.60 07/17/2022   Lab Results  Component Value Date   LDLCALC 83 07/17/2022   Lab Results  Component Value Date   TRIG 65.0 07/17/2022   Lab Results  Component Value Date   CHOLHDL 3 07/17/2022   No results found for: "LDLDIRECT"   Examination:   BP 112/76 (BP Location: Left Arm, Patient Position: Sitting, Cuff Size: Normal)   Ht 5' (1.524 m)   Wt 197 lb 9.6 oz (89.6 kg)   BMI 38.59 kg/m   Body mass index is 38.59 kg/m.    ASSESSMENT/ PLAN:    Diabetes type 2:   Current regimen: NPH and regular once a day only  See history of present illness for detailed discussion of current diabetes management, [facility]  A1c is 7.3 compared to 9%  Blood glucose levels are much better with switching to Lantus and regular insulin However blood sugars are still significantly high after her first meal Unclear whether this is related to inadequate insulin, slow action of regular insulin or taking insulin postprandially She is using the Dexcom now Blood sugar patterns were reviewed  Obesity with BMI about 38   Recommendations:   She will try Humalog instead of Novolin regular insulin for faster onset of action She will continue to try and take 18 units but she may need to take higher doses if still having high readings after meals If she wants to finish off her Novolin regular  insulin she can do that but she needs to make sure she takes the insulin consistently about 30 to 60 minutes before breakfast before her first meal She can continue the Lantus but reduce the dose to 18 units to avoid low sugars overnight Also needs to confirm that her blood sugars are truly low when she has a low alert at night with a fingerstick May need to see diabetes educator to review her diet also, otherwise needs to try and avoid high-fat foods in the morning  Total visit time including counseling = 30 minutes  There are no Patient Instructions on file for this visit.   AElayne Snare2/14/2024, 10:34 AM

## 2023-02-07 NOTE — Patient Instructions (Addendum)
Take Novolin R before eating always, preferably 30 minutes before Humalog can be taken 15 minutes before eating  Lantus 18 units daily  If the sugar is low on the sensor try to check at the same time with the fingerstick glucose monitor  Avoid fast food biscuits

## 2023-02-08 DIAGNOSIS — E1129 Type 2 diabetes mellitus with other diabetic kidney complication: Secondary | ICD-10-CM | POA: Diagnosis not present

## 2023-02-08 DIAGNOSIS — N2581 Secondary hyperparathyroidism of renal origin: Secondary | ICD-10-CM | POA: Diagnosis not present

## 2023-02-08 DIAGNOSIS — Z992 Dependence on renal dialysis: Secondary | ICD-10-CM | POA: Diagnosis not present

## 2023-02-08 DIAGNOSIS — D688 Other specified coagulation defects: Secondary | ICD-10-CM | POA: Diagnosis not present

## 2023-02-08 DIAGNOSIS — D631 Anemia in chronic kidney disease: Secondary | ICD-10-CM | POA: Diagnosis not present

## 2023-02-08 DIAGNOSIS — R519 Headache, unspecified: Secondary | ICD-10-CM | POA: Diagnosis not present

## 2023-02-08 DIAGNOSIS — N186 End stage renal disease: Secondary | ICD-10-CM | POA: Diagnosis not present

## 2023-02-09 ENCOUNTER — Encounter: Payer: Self-pay | Admitting: Endocrinology

## 2023-02-10 DIAGNOSIS — N186 End stage renal disease: Secondary | ICD-10-CM | POA: Diagnosis not present

## 2023-02-10 DIAGNOSIS — Z992 Dependence on renal dialysis: Secondary | ICD-10-CM | POA: Diagnosis not present

## 2023-02-10 DIAGNOSIS — D631 Anemia in chronic kidney disease: Secondary | ICD-10-CM | POA: Diagnosis not present

## 2023-02-10 DIAGNOSIS — N2581 Secondary hyperparathyroidism of renal origin: Secondary | ICD-10-CM | POA: Diagnosis not present

## 2023-02-10 DIAGNOSIS — D688 Other specified coagulation defects: Secondary | ICD-10-CM | POA: Diagnosis not present

## 2023-02-10 DIAGNOSIS — R519 Headache, unspecified: Secondary | ICD-10-CM | POA: Diagnosis not present

## 2023-02-10 DIAGNOSIS — E1129 Type 2 diabetes mellitus with other diabetic kidney complication: Secondary | ICD-10-CM | POA: Diagnosis not present

## 2023-02-13 DIAGNOSIS — R519 Headache, unspecified: Secondary | ICD-10-CM | POA: Diagnosis not present

## 2023-02-13 DIAGNOSIS — N186 End stage renal disease: Secondary | ICD-10-CM | POA: Diagnosis not present

## 2023-02-13 DIAGNOSIS — D688 Other specified coagulation defects: Secondary | ICD-10-CM | POA: Diagnosis not present

## 2023-02-13 DIAGNOSIS — Z992 Dependence on renal dialysis: Secondary | ICD-10-CM | POA: Diagnosis not present

## 2023-02-13 DIAGNOSIS — N2581 Secondary hyperparathyroidism of renal origin: Secondary | ICD-10-CM | POA: Diagnosis not present

## 2023-02-13 DIAGNOSIS — D631 Anemia in chronic kidney disease: Secondary | ICD-10-CM | POA: Diagnosis not present

## 2023-02-13 DIAGNOSIS — E1129 Type 2 diabetes mellitus with other diabetic kidney complication: Secondary | ICD-10-CM | POA: Diagnosis not present

## 2023-02-15 DIAGNOSIS — N186 End stage renal disease: Secondary | ICD-10-CM | POA: Diagnosis not present

## 2023-02-15 DIAGNOSIS — R519 Headache, unspecified: Secondary | ICD-10-CM | POA: Diagnosis not present

## 2023-02-15 DIAGNOSIS — E1129 Type 2 diabetes mellitus with other diabetic kidney complication: Secondary | ICD-10-CM | POA: Diagnosis not present

## 2023-02-15 DIAGNOSIS — Z992 Dependence on renal dialysis: Secondary | ICD-10-CM | POA: Diagnosis not present

## 2023-02-15 DIAGNOSIS — D631 Anemia in chronic kidney disease: Secondary | ICD-10-CM | POA: Diagnosis not present

## 2023-02-15 DIAGNOSIS — D688 Other specified coagulation defects: Secondary | ICD-10-CM | POA: Diagnosis not present

## 2023-02-15 DIAGNOSIS — N2581 Secondary hyperparathyroidism of renal origin: Secondary | ICD-10-CM | POA: Diagnosis not present

## 2023-02-19 DIAGNOSIS — R519 Headache, unspecified: Secondary | ICD-10-CM | POA: Diagnosis not present

## 2023-02-19 DIAGNOSIS — E1129 Type 2 diabetes mellitus with other diabetic kidney complication: Secondary | ICD-10-CM | POA: Diagnosis not present

## 2023-02-19 DIAGNOSIS — Z992 Dependence on renal dialysis: Secondary | ICD-10-CM | POA: Diagnosis not present

## 2023-02-19 DIAGNOSIS — D688 Other specified coagulation defects: Secondary | ICD-10-CM | POA: Diagnosis not present

## 2023-02-19 DIAGNOSIS — N186 End stage renal disease: Secondary | ICD-10-CM | POA: Diagnosis not present

## 2023-02-19 DIAGNOSIS — D631 Anemia in chronic kidney disease: Secondary | ICD-10-CM | POA: Diagnosis not present

## 2023-02-19 DIAGNOSIS — N2581 Secondary hyperparathyroidism of renal origin: Secondary | ICD-10-CM | POA: Diagnosis not present

## 2023-02-20 DIAGNOSIS — N2581 Secondary hyperparathyroidism of renal origin: Secondary | ICD-10-CM | POA: Diagnosis not present

## 2023-02-20 DIAGNOSIS — Z992 Dependence on renal dialysis: Secondary | ICD-10-CM | POA: Diagnosis not present

## 2023-02-20 DIAGNOSIS — E1129 Type 2 diabetes mellitus with other diabetic kidney complication: Secondary | ICD-10-CM | POA: Diagnosis not present

## 2023-02-20 DIAGNOSIS — D688 Other specified coagulation defects: Secondary | ICD-10-CM | POA: Diagnosis not present

## 2023-02-20 DIAGNOSIS — D631 Anemia in chronic kidney disease: Secondary | ICD-10-CM | POA: Diagnosis not present

## 2023-02-20 DIAGNOSIS — R519 Headache, unspecified: Secondary | ICD-10-CM | POA: Diagnosis not present

## 2023-02-20 DIAGNOSIS — N186 End stage renal disease: Secondary | ICD-10-CM | POA: Diagnosis not present

## 2023-02-22 DIAGNOSIS — D631 Anemia in chronic kidney disease: Secondary | ICD-10-CM | POA: Diagnosis not present

## 2023-02-22 DIAGNOSIS — E1129 Type 2 diabetes mellitus with other diabetic kidney complication: Secondary | ICD-10-CM | POA: Diagnosis not present

## 2023-02-22 DIAGNOSIS — R519 Headache, unspecified: Secondary | ICD-10-CM | POA: Diagnosis not present

## 2023-02-22 DIAGNOSIS — E1122 Type 2 diabetes mellitus with diabetic chronic kidney disease: Secondary | ICD-10-CM | POA: Diagnosis not present

## 2023-02-22 DIAGNOSIS — N186 End stage renal disease: Secondary | ICD-10-CM | POA: Diagnosis not present

## 2023-02-22 DIAGNOSIS — Z992 Dependence on renal dialysis: Secondary | ICD-10-CM | POA: Diagnosis not present

## 2023-02-22 DIAGNOSIS — D688 Other specified coagulation defects: Secondary | ICD-10-CM | POA: Diagnosis not present

## 2023-02-22 DIAGNOSIS — N2581 Secondary hyperparathyroidism of renal origin: Secondary | ICD-10-CM | POA: Diagnosis not present

## 2023-02-24 DIAGNOSIS — N186 End stage renal disease: Secondary | ICD-10-CM | POA: Diagnosis not present

## 2023-02-24 DIAGNOSIS — E1129 Type 2 diabetes mellitus with other diabetic kidney complication: Secondary | ICD-10-CM | POA: Diagnosis not present

## 2023-02-24 DIAGNOSIS — D688 Other specified coagulation defects: Secondary | ICD-10-CM | POA: Diagnosis not present

## 2023-02-24 DIAGNOSIS — Z992 Dependence on renal dialysis: Secondary | ICD-10-CM | POA: Diagnosis not present

## 2023-02-24 DIAGNOSIS — N2581 Secondary hyperparathyroidism of renal origin: Secondary | ICD-10-CM | POA: Diagnosis not present

## 2023-02-27 DIAGNOSIS — E1129 Type 2 diabetes mellitus with other diabetic kidney complication: Secondary | ICD-10-CM | POA: Diagnosis not present

## 2023-02-27 DIAGNOSIS — N186 End stage renal disease: Secondary | ICD-10-CM | POA: Diagnosis not present

## 2023-02-27 DIAGNOSIS — D688 Other specified coagulation defects: Secondary | ICD-10-CM | POA: Diagnosis not present

## 2023-02-27 DIAGNOSIS — N2581 Secondary hyperparathyroidism of renal origin: Secondary | ICD-10-CM | POA: Diagnosis not present

## 2023-02-27 DIAGNOSIS — Z992 Dependence on renal dialysis: Secondary | ICD-10-CM | POA: Diagnosis not present

## 2023-03-01 DIAGNOSIS — N186 End stage renal disease: Secondary | ICD-10-CM | POA: Diagnosis not present

## 2023-03-01 DIAGNOSIS — Z992 Dependence on renal dialysis: Secondary | ICD-10-CM | POA: Diagnosis not present

## 2023-03-01 DIAGNOSIS — D688 Other specified coagulation defects: Secondary | ICD-10-CM | POA: Diagnosis not present

## 2023-03-01 DIAGNOSIS — N2581 Secondary hyperparathyroidism of renal origin: Secondary | ICD-10-CM | POA: Diagnosis not present

## 2023-03-01 DIAGNOSIS — E1129 Type 2 diabetes mellitus with other diabetic kidney complication: Secondary | ICD-10-CM | POA: Diagnosis not present

## 2023-03-03 DIAGNOSIS — E1129 Type 2 diabetes mellitus with other diabetic kidney complication: Secondary | ICD-10-CM | POA: Diagnosis not present

## 2023-03-03 DIAGNOSIS — N2581 Secondary hyperparathyroidism of renal origin: Secondary | ICD-10-CM | POA: Diagnosis not present

## 2023-03-03 DIAGNOSIS — Z992 Dependence on renal dialysis: Secondary | ICD-10-CM | POA: Diagnosis not present

## 2023-03-03 DIAGNOSIS — D688 Other specified coagulation defects: Secondary | ICD-10-CM | POA: Diagnosis not present

## 2023-03-03 DIAGNOSIS — N186 End stage renal disease: Secondary | ICD-10-CM | POA: Diagnosis not present

## 2023-03-06 DIAGNOSIS — N186 End stage renal disease: Secondary | ICD-10-CM | POA: Diagnosis not present

## 2023-03-06 DIAGNOSIS — E1129 Type 2 diabetes mellitus with other diabetic kidney complication: Secondary | ICD-10-CM | POA: Diagnosis not present

## 2023-03-06 DIAGNOSIS — N2581 Secondary hyperparathyroidism of renal origin: Secondary | ICD-10-CM | POA: Diagnosis not present

## 2023-03-06 DIAGNOSIS — D688 Other specified coagulation defects: Secondary | ICD-10-CM | POA: Diagnosis not present

## 2023-03-06 DIAGNOSIS — Z992 Dependence on renal dialysis: Secondary | ICD-10-CM | POA: Diagnosis not present

## 2023-03-08 DIAGNOSIS — D688 Other specified coagulation defects: Secondary | ICD-10-CM | POA: Diagnosis not present

## 2023-03-08 DIAGNOSIS — N2581 Secondary hyperparathyroidism of renal origin: Secondary | ICD-10-CM | POA: Diagnosis not present

## 2023-03-08 DIAGNOSIS — N186 End stage renal disease: Secondary | ICD-10-CM | POA: Diagnosis not present

## 2023-03-08 DIAGNOSIS — E1129 Type 2 diabetes mellitus with other diabetic kidney complication: Secondary | ICD-10-CM | POA: Diagnosis not present

## 2023-03-08 DIAGNOSIS — Z992 Dependence on renal dialysis: Secondary | ICD-10-CM | POA: Diagnosis not present

## 2023-03-10 DIAGNOSIS — Z992 Dependence on renal dialysis: Secondary | ICD-10-CM | POA: Diagnosis not present

## 2023-03-10 DIAGNOSIS — E1129 Type 2 diabetes mellitus with other diabetic kidney complication: Secondary | ICD-10-CM | POA: Diagnosis not present

## 2023-03-10 DIAGNOSIS — N2581 Secondary hyperparathyroidism of renal origin: Secondary | ICD-10-CM | POA: Diagnosis not present

## 2023-03-10 DIAGNOSIS — N186 End stage renal disease: Secondary | ICD-10-CM | POA: Diagnosis not present

## 2023-03-10 DIAGNOSIS — D688 Other specified coagulation defects: Secondary | ICD-10-CM | POA: Diagnosis not present

## 2023-03-12 DIAGNOSIS — E113293 Type 2 diabetes mellitus with mild nonproliferative diabetic retinopathy without macular edema, bilateral: Secondary | ICD-10-CM | POA: Diagnosis not present

## 2023-03-13 DIAGNOSIS — Z992 Dependence on renal dialysis: Secondary | ICD-10-CM | POA: Diagnosis not present

## 2023-03-13 DIAGNOSIS — N2581 Secondary hyperparathyroidism of renal origin: Secondary | ICD-10-CM | POA: Diagnosis not present

## 2023-03-13 DIAGNOSIS — E1129 Type 2 diabetes mellitus with other diabetic kidney complication: Secondary | ICD-10-CM | POA: Diagnosis not present

## 2023-03-13 DIAGNOSIS — N186 End stage renal disease: Secondary | ICD-10-CM | POA: Diagnosis not present

## 2023-03-13 DIAGNOSIS — D688 Other specified coagulation defects: Secondary | ICD-10-CM | POA: Diagnosis not present

## 2023-03-15 DIAGNOSIS — E1129 Type 2 diabetes mellitus with other diabetic kidney complication: Secondary | ICD-10-CM | POA: Diagnosis not present

## 2023-03-15 DIAGNOSIS — Z992 Dependence on renal dialysis: Secondary | ICD-10-CM | POA: Diagnosis not present

## 2023-03-15 DIAGNOSIS — N186 End stage renal disease: Secondary | ICD-10-CM | POA: Diagnosis not present

## 2023-03-15 DIAGNOSIS — D688 Other specified coagulation defects: Secondary | ICD-10-CM | POA: Diagnosis not present

## 2023-03-15 DIAGNOSIS — N2581 Secondary hyperparathyroidism of renal origin: Secondary | ICD-10-CM | POA: Diagnosis not present

## 2023-03-17 DIAGNOSIS — N186 End stage renal disease: Secondary | ICD-10-CM | POA: Diagnosis not present

## 2023-03-17 DIAGNOSIS — Z992 Dependence on renal dialysis: Secondary | ICD-10-CM | POA: Diagnosis not present

## 2023-03-17 DIAGNOSIS — E1129 Type 2 diabetes mellitus with other diabetic kidney complication: Secondary | ICD-10-CM | POA: Diagnosis not present

## 2023-03-17 DIAGNOSIS — D688 Other specified coagulation defects: Secondary | ICD-10-CM | POA: Diagnosis not present

## 2023-03-17 DIAGNOSIS — N2581 Secondary hyperparathyroidism of renal origin: Secondary | ICD-10-CM | POA: Diagnosis not present

## 2023-03-20 DIAGNOSIS — E1129 Type 2 diabetes mellitus with other diabetic kidney complication: Secondary | ICD-10-CM | POA: Diagnosis not present

## 2023-03-20 DIAGNOSIS — Z992 Dependence on renal dialysis: Secondary | ICD-10-CM | POA: Diagnosis not present

## 2023-03-20 DIAGNOSIS — D688 Other specified coagulation defects: Secondary | ICD-10-CM | POA: Diagnosis not present

## 2023-03-20 DIAGNOSIS — N186 End stage renal disease: Secondary | ICD-10-CM | POA: Diagnosis not present

## 2023-03-20 DIAGNOSIS — N2581 Secondary hyperparathyroidism of renal origin: Secondary | ICD-10-CM | POA: Diagnosis not present

## 2023-03-22 DIAGNOSIS — D688 Other specified coagulation defects: Secondary | ICD-10-CM | POA: Diagnosis not present

## 2023-03-22 DIAGNOSIS — N2581 Secondary hyperparathyroidism of renal origin: Secondary | ICD-10-CM | POA: Diagnosis not present

## 2023-03-22 DIAGNOSIS — N186 End stage renal disease: Secondary | ICD-10-CM | POA: Diagnosis not present

## 2023-03-22 DIAGNOSIS — Z992 Dependence on renal dialysis: Secondary | ICD-10-CM | POA: Diagnosis not present

## 2023-03-22 DIAGNOSIS — E1129 Type 2 diabetes mellitus with other diabetic kidney complication: Secondary | ICD-10-CM | POA: Diagnosis not present

## 2023-03-24 DIAGNOSIS — N2581 Secondary hyperparathyroidism of renal origin: Secondary | ICD-10-CM | POA: Diagnosis not present

## 2023-03-24 DIAGNOSIS — E1129 Type 2 diabetes mellitus with other diabetic kidney complication: Secondary | ICD-10-CM | POA: Diagnosis not present

## 2023-03-24 DIAGNOSIS — D688 Other specified coagulation defects: Secondary | ICD-10-CM | POA: Diagnosis not present

## 2023-03-24 DIAGNOSIS — N186 End stage renal disease: Secondary | ICD-10-CM | POA: Diagnosis not present

## 2023-03-24 DIAGNOSIS — Z992 Dependence on renal dialysis: Secondary | ICD-10-CM | POA: Diagnosis not present

## 2023-03-25 DIAGNOSIS — E1122 Type 2 diabetes mellitus with diabetic chronic kidney disease: Secondary | ICD-10-CM | POA: Diagnosis not present

## 2023-03-25 DIAGNOSIS — Z992 Dependence on renal dialysis: Secondary | ICD-10-CM | POA: Diagnosis not present

## 2023-03-25 DIAGNOSIS — N186 End stage renal disease: Secondary | ICD-10-CM | POA: Diagnosis not present

## 2023-03-27 DIAGNOSIS — E1129 Type 2 diabetes mellitus with other diabetic kidney complication: Secondary | ICD-10-CM | POA: Diagnosis not present

## 2023-03-27 DIAGNOSIS — N186 End stage renal disease: Secondary | ICD-10-CM | POA: Diagnosis not present

## 2023-03-27 DIAGNOSIS — D631 Anemia in chronic kidney disease: Secondary | ICD-10-CM | POA: Diagnosis not present

## 2023-03-27 DIAGNOSIS — D688 Other specified coagulation defects: Secondary | ICD-10-CM | POA: Diagnosis not present

## 2023-03-27 DIAGNOSIS — N2581 Secondary hyperparathyroidism of renal origin: Secondary | ICD-10-CM | POA: Diagnosis not present

## 2023-03-27 DIAGNOSIS — Z992 Dependence on renal dialysis: Secondary | ICD-10-CM | POA: Diagnosis not present

## 2023-03-27 DIAGNOSIS — R519 Headache, unspecified: Secondary | ICD-10-CM | POA: Diagnosis not present

## 2023-03-29 DIAGNOSIS — N186 End stage renal disease: Secondary | ICD-10-CM | POA: Diagnosis not present

## 2023-03-29 DIAGNOSIS — N2581 Secondary hyperparathyroidism of renal origin: Secondary | ICD-10-CM | POA: Diagnosis not present

## 2023-03-29 DIAGNOSIS — D631 Anemia in chronic kidney disease: Secondary | ICD-10-CM | POA: Diagnosis not present

## 2023-03-29 DIAGNOSIS — R519 Headache, unspecified: Secondary | ICD-10-CM | POA: Diagnosis not present

## 2023-03-29 DIAGNOSIS — E1129 Type 2 diabetes mellitus with other diabetic kidney complication: Secondary | ICD-10-CM | POA: Diagnosis not present

## 2023-03-29 DIAGNOSIS — Z992 Dependence on renal dialysis: Secondary | ICD-10-CM | POA: Diagnosis not present

## 2023-03-29 DIAGNOSIS — D688 Other specified coagulation defects: Secondary | ICD-10-CM | POA: Diagnosis not present

## 2023-03-31 DIAGNOSIS — Z992 Dependence on renal dialysis: Secondary | ICD-10-CM | POA: Diagnosis not present

## 2023-03-31 DIAGNOSIS — R519 Headache, unspecified: Secondary | ICD-10-CM | POA: Diagnosis not present

## 2023-03-31 DIAGNOSIS — N186 End stage renal disease: Secondary | ICD-10-CM | POA: Diagnosis not present

## 2023-03-31 DIAGNOSIS — E1129 Type 2 diabetes mellitus with other diabetic kidney complication: Secondary | ICD-10-CM | POA: Diagnosis not present

## 2023-03-31 DIAGNOSIS — N2581 Secondary hyperparathyroidism of renal origin: Secondary | ICD-10-CM | POA: Diagnosis not present

## 2023-03-31 DIAGNOSIS — D631 Anemia in chronic kidney disease: Secondary | ICD-10-CM | POA: Diagnosis not present

## 2023-03-31 DIAGNOSIS — D688 Other specified coagulation defects: Secondary | ICD-10-CM | POA: Diagnosis not present

## 2023-04-03 DIAGNOSIS — N2581 Secondary hyperparathyroidism of renal origin: Secondary | ICD-10-CM | POA: Diagnosis not present

## 2023-04-03 DIAGNOSIS — E1129 Type 2 diabetes mellitus with other diabetic kidney complication: Secondary | ICD-10-CM | POA: Diagnosis not present

## 2023-04-03 DIAGNOSIS — N186 End stage renal disease: Secondary | ICD-10-CM | POA: Diagnosis not present

## 2023-04-03 DIAGNOSIS — R519 Headache, unspecified: Secondary | ICD-10-CM | POA: Diagnosis not present

## 2023-04-03 DIAGNOSIS — D688 Other specified coagulation defects: Secondary | ICD-10-CM | POA: Diagnosis not present

## 2023-04-03 DIAGNOSIS — D631 Anemia in chronic kidney disease: Secondary | ICD-10-CM | POA: Diagnosis not present

## 2023-04-03 DIAGNOSIS — Z992 Dependence on renal dialysis: Secondary | ICD-10-CM | POA: Diagnosis not present

## 2023-04-05 DIAGNOSIS — D688 Other specified coagulation defects: Secondary | ICD-10-CM | POA: Diagnosis not present

## 2023-04-05 DIAGNOSIS — N2581 Secondary hyperparathyroidism of renal origin: Secondary | ICD-10-CM | POA: Diagnosis not present

## 2023-04-05 DIAGNOSIS — D631 Anemia in chronic kidney disease: Secondary | ICD-10-CM | POA: Diagnosis not present

## 2023-04-05 DIAGNOSIS — N186 End stage renal disease: Secondary | ICD-10-CM | POA: Diagnosis not present

## 2023-04-05 DIAGNOSIS — R519 Headache, unspecified: Secondary | ICD-10-CM | POA: Diagnosis not present

## 2023-04-05 DIAGNOSIS — Z992 Dependence on renal dialysis: Secondary | ICD-10-CM | POA: Diagnosis not present

## 2023-04-05 DIAGNOSIS — E1129 Type 2 diabetes mellitus with other diabetic kidney complication: Secondary | ICD-10-CM | POA: Diagnosis not present

## 2023-04-07 DIAGNOSIS — N2581 Secondary hyperparathyroidism of renal origin: Secondary | ICD-10-CM | POA: Diagnosis not present

## 2023-04-07 DIAGNOSIS — Z992 Dependence on renal dialysis: Secondary | ICD-10-CM | POA: Diagnosis not present

## 2023-04-07 DIAGNOSIS — E1129 Type 2 diabetes mellitus with other diabetic kidney complication: Secondary | ICD-10-CM | POA: Diagnosis not present

## 2023-04-07 DIAGNOSIS — D631 Anemia in chronic kidney disease: Secondary | ICD-10-CM | POA: Diagnosis not present

## 2023-04-07 DIAGNOSIS — D688 Other specified coagulation defects: Secondary | ICD-10-CM | POA: Diagnosis not present

## 2023-04-07 DIAGNOSIS — R519 Headache, unspecified: Secondary | ICD-10-CM | POA: Diagnosis not present

## 2023-04-07 DIAGNOSIS — N186 End stage renal disease: Secondary | ICD-10-CM | POA: Diagnosis not present

## 2023-04-10 DIAGNOSIS — R519 Headache, unspecified: Secondary | ICD-10-CM | POA: Diagnosis not present

## 2023-04-10 DIAGNOSIS — N2581 Secondary hyperparathyroidism of renal origin: Secondary | ICD-10-CM | POA: Diagnosis not present

## 2023-04-10 DIAGNOSIS — E1129 Type 2 diabetes mellitus with other diabetic kidney complication: Secondary | ICD-10-CM | POA: Diagnosis not present

## 2023-04-10 DIAGNOSIS — Z992 Dependence on renal dialysis: Secondary | ICD-10-CM | POA: Diagnosis not present

## 2023-04-10 DIAGNOSIS — D688 Other specified coagulation defects: Secondary | ICD-10-CM | POA: Diagnosis not present

## 2023-04-10 DIAGNOSIS — D631 Anemia in chronic kidney disease: Secondary | ICD-10-CM | POA: Diagnosis not present

## 2023-04-10 DIAGNOSIS — N186 End stage renal disease: Secondary | ICD-10-CM | POA: Diagnosis not present

## 2023-04-12 ENCOUNTER — Other Ambulatory Visit: Payer: Self-pay | Admitting: Endocrinology

## 2023-04-12 DIAGNOSIS — D688 Other specified coagulation defects: Secondary | ICD-10-CM | POA: Diagnosis not present

## 2023-04-12 DIAGNOSIS — N2581 Secondary hyperparathyroidism of renal origin: Secondary | ICD-10-CM | POA: Diagnosis not present

## 2023-04-12 DIAGNOSIS — Z992 Dependence on renal dialysis: Secondary | ICD-10-CM | POA: Diagnosis not present

## 2023-04-12 DIAGNOSIS — Z794 Long term (current) use of insulin: Secondary | ICD-10-CM

## 2023-04-12 DIAGNOSIS — E1129 Type 2 diabetes mellitus with other diabetic kidney complication: Secondary | ICD-10-CM | POA: Diagnosis not present

## 2023-04-12 DIAGNOSIS — D631 Anemia in chronic kidney disease: Secondary | ICD-10-CM | POA: Diagnosis not present

## 2023-04-12 DIAGNOSIS — N186 End stage renal disease: Secondary | ICD-10-CM | POA: Diagnosis not present

## 2023-04-12 DIAGNOSIS — R519 Headache, unspecified: Secondary | ICD-10-CM | POA: Diagnosis not present

## 2023-04-13 ENCOUNTER — Other Ambulatory Visit: Payer: Self-pay | Admitting: Endocrinology

## 2023-04-14 DIAGNOSIS — E1129 Type 2 diabetes mellitus with other diabetic kidney complication: Secondary | ICD-10-CM | POA: Diagnosis not present

## 2023-04-14 DIAGNOSIS — N186 End stage renal disease: Secondary | ICD-10-CM | POA: Diagnosis not present

## 2023-04-14 DIAGNOSIS — D688 Other specified coagulation defects: Secondary | ICD-10-CM | POA: Diagnosis not present

## 2023-04-14 DIAGNOSIS — R519 Headache, unspecified: Secondary | ICD-10-CM | POA: Diagnosis not present

## 2023-04-14 DIAGNOSIS — D631 Anemia in chronic kidney disease: Secondary | ICD-10-CM | POA: Diagnosis not present

## 2023-04-14 DIAGNOSIS — N2581 Secondary hyperparathyroidism of renal origin: Secondary | ICD-10-CM | POA: Diagnosis not present

## 2023-04-14 DIAGNOSIS — Z992 Dependence on renal dialysis: Secondary | ICD-10-CM | POA: Diagnosis not present

## 2023-04-16 ENCOUNTER — Other Ambulatory Visit (HOSPITAL_COMMUNITY): Payer: Self-pay

## 2023-04-16 ENCOUNTER — Encounter (HOSPITAL_COMMUNITY): Payer: Self-pay

## 2023-04-16 ENCOUNTER — Telehealth: Payer: Self-pay | Admitting: Pharmacy Technician

## 2023-04-16 NOTE — Telephone Encounter (Signed)
Pharmacy Patient Advocate Encounter  Received notification from RX request msgs/CMA that prior authorization for Lantus is required/requested, or an alternate medication needed.  Per Test Claim: Lantus is covered.  Spoke with pharmacy. They were able to fill it. No change or PA needed.  He said he had to change the [facility]. Not sure if they were trying to fill generic or something like that.

## 2023-04-17 DIAGNOSIS — R519 Headache, unspecified: Secondary | ICD-10-CM | POA: Diagnosis not present

## 2023-04-17 DIAGNOSIS — E1129 Type 2 diabetes mellitus with other diabetic kidney complication: Secondary | ICD-10-CM | POA: Diagnosis not present

## 2023-04-17 DIAGNOSIS — D631 Anemia in chronic kidney disease: Secondary | ICD-10-CM | POA: Diagnosis not present

## 2023-04-17 DIAGNOSIS — N2581 Secondary hyperparathyroidism of renal origin: Secondary | ICD-10-CM | POA: Diagnosis not present

## 2023-04-17 DIAGNOSIS — N186 End stage renal disease: Secondary | ICD-10-CM | POA: Diagnosis not present

## 2023-04-17 DIAGNOSIS — D688 Other specified coagulation defects: Secondary | ICD-10-CM | POA: Diagnosis not present

## 2023-04-17 DIAGNOSIS — Z992 Dependence on renal dialysis: Secondary | ICD-10-CM | POA: Diagnosis not present

## 2023-04-19 DIAGNOSIS — N186 End stage renal disease: Secondary | ICD-10-CM | POA: Diagnosis not present

## 2023-04-19 DIAGNOSIS — D688 Other specified coagulation defects: Secondary | ICD-10-CM | POA: Diagnosis not present

## 2023-04-19 DIAGNOSIS — E1129 Type 2 diabetes mellitus with other diabetic kidney complication: Secondary | ICD-10-CM | POA: Diagnosis not present

## 2023-04-19 DIAGNOSIS — D631 Anemia in chronic kidney disease: Secondary | ICD-10-CM | POA: Diagnosis not present

## 2023-04-19 DIAGNOSIS — R519 Headache, unspecified: Secondary | ICD-10-CM | POA: Diagnosis not present

## 2023-04-19 DIAGNOSIS — Z992 Dependence on renal dialysis: Secondary | ICD-10-CM | POA: Diagnosis not present

## 2023-04-19 DIAGNOSIS — N2581 Secondary hyperparathyroidism of renal origin: Secondary | ICD-10-CM | POA: Diagnosis not present

## 2023-04-21 DIAGNOSIS — D631 Anemia in chronic kidney disease: Secondary | ICD-10-CM | POA: Diagnosis not present

## 2023-04-21 DIAGNOSIS — D688 Other specified coagulation defects: Secondary | ICD-10-CM | POA: Diagnosis not present

## 2023-04-21 DIAGNOSIS — N186 End stage renal disease: Secondary | ICD-10-CM | POA: Diagnosis not present

## 2023-04-21 DIAGNOSIS — E1129 Type 2 diabetes mellitus with other diabetic kidney complication: Secondary | ICD-10-CM | POA: Diagnosis not present

## 2023-04-21 DIAGNOSIS — N2581 Secondary hyperparathyroidism of renal origin: Secondary | ICD-10-CM | POA: Diagnosis not present

## 2023-04-21 DIAGNOSIS — R519 Headache, unspecified: Secondary | ICD-10-CM | POA: Diagnosis not present

## 2023-04-21 DIAGNOSIS — Z992 Dependence on renal dialysis: Secondary | ICD-10-CM | POA: Diagnosis not present

## 2023-04-24 DIAGNOSIS — D688 Other specified coagulation defects: Secondary | ICD-10-CM | POA: Diagnosis not present

## 2023-04-24 DIAGNOSIS — N186 End stage renal disease: Secondary | ICD-10-CM | POA: Diagnosis not present

## 2023-04-24 DIAGNOSIS — Z992 Dependence on renal dialysis: Secondary | ICD-10-CM | POA: Diagnosis not present

## 2023-04-24 DIAGNOSIS — N2581 Secondary hyperparathyroidism of renal origin: Secondary | ICD-10-CM | POA: Diagnosis not present

## 2023-04-24 DIAGNOSIS — E1129 Type 2 diabetes mellitus with other diabetic kidney complication: Secondary | ICD-10-CM | POA: Diagnosis not present

## 2023-04-24 DIAGNOSIS — R519 Headache, unspecified: Secondary | ICD-10-CM | POA: Diagnosis not present

## 2023-04-24 DIAGNOSIS — D631 Anemia in chronic kidney disease: Secondary | ICD-10-CM | POA: Diagnosis not present

## 2023-04-24 DIAGNOSIS — E1122 Type 2 diabetes mellitus with diabetic chronic kidney disease: Secondary | ICD-10-CM | POA: Diagnosis not present

## 2023-04-26 DIAGNOSIS — D688 Other specified coagulation defects: Secondary | ICD-10-CM | POA: Diagnosis not present

## 2023-04-26 DIAGNOSIS — N186 End stage renal disease: Secondary | ICD-10-CM | POA: Diagnosis not present

## 2023-04-26 DIAGNOSIS — R519 Headache, unspecified: Secondary | ICD-10-CM | POA: Diagnosis not present

## 2023-04-26 DIAGNOSIS — E1129 Type 2 diabetes mellitus with other diabetic kidney complication: Secondary | ICD-10-CM | POA: Diagnosis not present

## 2023-04-26 DIAGNOSIS — D631 Anemia in chronic kidney disease: Secondary | ICD-10-CM | POA: Diagnosis not present

## 2023-04-26 DIAGNOSIS — N2581 Secondary hyperparathyroidism of renal origin: Secondary | ICD-10-CM | POA: Diagnosis not present

## 2023-04-26 DIAGNOSIS — Z992 Dependence on renal dialysis: Secondary | ICD-10-CM | POA: Diagnosis not present

## 2023-04-28 DIAGNOSIS — D631 Anemia in chronic kidney disease: Secondary | ICD-10-CM | POA: Diagnosis not present

## 2023-04-28 DIAGNOSIS — N186 End stage renal disease: Secondary | ICD-10-CM | POA: Diagnosis not present

## 2023-04-28 DIAGNOSIS — N2581 Secondary hyperparathyroidism of renal origin: Secondary | ICD-10-CM | POA: Diagnosis not present

## 2023-04-28 DIAGNOSIS — Z992 Dependence on renal dialysis: Secondary | ICD-10-CM | POA: Diagnosis not present

## 2023-04-28 DIAGNOSIS — E1129 Type 2 diabetes mellitus with other diabetic kidney complication: Secondary | ICD-10-CM | POA: Diagnosis not present

## 2023-04-28 DIAGNOSIS — R519 Headache, unspecified: Secondary | ICD-10-CM | POA: Diagnosis not present

## 2023-04-28 DIAGNOSIS — D688 Other specified coagulation defects: Secondary | ICD-10-CM | POA: Diagnosis not present

## 2023-05-01 DIAGNOSIS — N186 End stage renal disease: Secondary | ICD-10-CM | POA: Diagnosis not present

## 2023-05-01 DIAGNOSIS — E1129 Type 2 diabetes mellitus with other diabetic kidney complication: Secondary | ICD-10-CM | POA: Diagnosis not present

## 2023-05-01 DIAGNOSIS — D688 Other specified coagulation defects: Secondary | ICD-10-CM | POA: Diagnosis not present

## 2023-05-01 DIAGNOSIS — N2581 Secondary hyperparathyroidism of renal origin: Secondary | ICD-10-CM | POA: Diagnosis not present

## 2023-05-01 DIAGNOSIS — R519 Headache, unspecified: Secondary | ICD-10-CM | POA: Diagnosis not present

## 2023-05-01 DIAGNOSIS — D631 Anemia in chronic kidney disease: Secondary | ICD-10-CM | POA: Diagnosis not present

## 2023-05-01 DIAGNOSIS — Z992 Dependence on renal dialysis: Secondary | ICD-10-CM | POA: Diagnosis not present

## 2023-05-03 DIAGNOSIS — D631 Anemia in chronic kidney disease: Secondary | ICD-10-CM | POA: Diagnosis not present

## 2023-05-03 DIAGNOSIS — N2581 Secondary hyperparathyroidism of renal origin: Secondary | ICD-10-CM | POA: Diagnosis not present

## 2023-05-03 DIAGNOSIS — D688 Other specified coagulation defects: Secondary | ICD-10-CM | POA: Diagnosis not present

## 2023-05-03 DIAGNOSIS — E1129 Type 2 diabetes mellitus with other diabetic kidney complication: Secondary | ICD-10-CM | POA: Diagnosis not present

## 2023-05-03 DIAGNOSIS — N186 End stage renal disease: Secondary | ICD-10-CM | POA: Diagnosis not present

## 2023-05-03 DIAGNOSIS — R519 Headache, unspecified: Secondary | ICD-10-CM | POA: Diagnosis not present

## 2023-05-03 DIAGNOSIS — Z992 Dependence on renal dialysis: Secondary | ICD-10-CM | POA: Diagnosis not present

## 2023-05-04 ENCOUNTER — Telehealth: Payer: Self-pay

## 2023-05-04 DIAGNOSIS — E1165 Type 2 diabetes mellitus with hyperglycemia: Secondary | ICD-10-CM

## 2023-05-04 DIAGNOSIS — Z794 Long term (current) use of insulin: Secondary | ICD-10-CM

## 2023-05-04 NOTE — Telephone Encounter (Signed)
Orders Placed This Encounter  Procedures   Comprehensive metabolic panel    Standing Status:   Future    Standing Expiration Date:   11/04/2023   Microalbumin / creatinine urine ratio    Standing Status:   Future    Standing Expiration Date:   11/04/2023   Lipid panel    Standing Status:   Future    Standing Expiration Date:   11/04/2023   Hemoglobin A1c    Standing Status:   Future    Standing Expiration Date:   11/04/2023    

## 2023-05-05 DIAGNOSIS — N186 End stage renal disease: Secondary | ICD-10-CM | POA: Diagnosis not present

## 2023-05-05 DIAGNOSIS — Z992 Dependence on renal dialysis: Secondary | ICD-10-CM | POA: Diagnosis not present

## 2023-05-05 DIAGNOSIS — E1129 Type 2 diabetes mellitus with other diabetic kidney complication: Secondary | ICD-10-CM | POA: Diagnosis not present

## 2023-05-05 DIAGNOSIS — D631 Anemia in chronic kidney disease: Secondary | ICD-10-CM | POA: Diagnosis not present

## 2023-05-05 DIAGNOSIS — N2581 Secondary hyperparathyroidism of renal origin: Secondary | ICD-10-CM | POA: Diagnosis not present

## 2023-05-05 DIAGNOSIS — R519 Headache, unspecified: Secondary | ICD-10-CM | POA: Diagnosis not present

## 2023-05-05 DIAGNOSIS — D688 Other specified coagulation defects: Secondary | ICD-10-CM | POA: Diagnosis not present

## 2023-05-08 DIAGNOSIS — D688 Other specified coagulation defects: Secondary | ICD-10-CM | POA: Diagnosis not present

## 2023-05-08 DIAGNOSIS — R519 Headache, unspecified: Secondary | ICD-10-CM | POA: Diagnosis not present

## 2023-05-08 DIAGNOSIS — E1129 Type 2 diabetes mellitus with other diabetic kidney complication: Secondary | ICD-10-CM | POA: Diagnosis not present

## 2023-05-08 DIAGNOSIS — Z992 Dependence on renal dialysis: Secondary | ICD-10-CM | POA: Diagnosis not present

## 2023-05-08 DIAGNOSIS — N186 End stage renal disease: Secondary | ICD-10-CM | POA: Diagnosis not present

## 2023-05-08 DIAGNOSIS — D631 Anemia in chronic kidney disease: Secondary | ICD-10-CM | POA: Diagnosis not present

## 2023-05-08 DIAGNOSIS — N2581 Secondary hyperparathyroidism of renal origin: Secondary | ICD-10-CM | POA: Diagnosis not present

## 2023-05-09 ENCOUNTER — Ambulatory Visit (INDEPENDENT_AMBULATORY_CARE_PROVIDER_SITE_OTHER): Payer: Medicare Other | Admitting: Podiatry

## 2023-05-09 DIAGNOSIS — B351 Tinea unguium: Secondary | ICD-10-CM | POA: Diagnosis not present

## 2023-05-09 DIAGNOSIS — E0822 Diabetes mellitus due to underlying condition with diabetic chronic kidney disease: Secondary | ICD-10-CM | POA: Diagnosis not present

## 2023-05-09 DIAGNOSIS — Z794 Long term (current) use of insulin: Secondary | ICD-10-CM | POA: Diagnosis not present

## 2023-05-09 DIAGNOSIS — M79675 Pain in left toe(s): Secondary | ICD-10-CM

## 2023-05-09 DIAGNOSIS — L84 Corns and callosities: Secondary | ICD-10-CM

## 2023-05-09 DIAGNOSIS — N186 End stage renal disease: Secondary | ICD-10-CM | POA: Diagnosis not present

## 2023-05-09 DIAGNOSIS — M79674 Pain in right toe(s): Secondary | ICD-10-CM | POA: Diagnosis not present

## 2023-05-09 DIAGNOSIS — Z992 Dependence on renal dialysis: Secondary | ICD-10-CM

## 2023-05-10 ENCOUNTER — Ambulatory Visit: Payer: Medicare Other | Admitting: "Endocrinology

## 2023-05-10 DIAGNOSIS — D688 Other specified coagulation defects: Secondary | ICD-10-CM | POA: Diagnosis not present

## 2023-05-10 DIAGNOSIS — N2581 Secondary hyperparathyroidism of renal origin: Secondary | ICD-10-CM | POA: Diagnosis not present

## 2023-05-10 DIAGNOSIS — D631 Anemia in chronic kidney disease: Secondary | ICD-10-CM | POA: Diagnosis not present

## 2023-05-10 DIAGNOSIS — N186 End stage renal disease: Secondary | ICD-10-CM | POA: Diagnosis not present

## 2023-05-10 DIAGNOSIS — E1129 Type 2 diabetes mellitus with other diabetic kidney complication: Secondary | ICD-10-CM | POA: Diagnosis not present

## 2023-05-10 DIAGNOSIS — R519 Headache, unspecified: Secondary | ICD-10-CM | POA: Diagnosis not present

## 2023-05-10 DIAGNOSIS — Z992 Dependence on renal dialysis: Secondary | ICD-10-CM | POA: Diagnosis not present

## 2023-05-11 ENCOUNTER — Other Ambulatory Visit: Payer: Medicare Other

## 2023-05-12 DIAGNOSIS — N186 End stage renal disease: Secondary | ICD-10-CM | POA: Diagnosis not present

## 2023-05-12 DIAGNOSIS — R519 Headache, unspecified: Secondary | ICD-10-CM | POA: Diagnosis not present

## 2023-05-12 DIAGNOSIS — Z992 Dependence on renal dialysis: Secondary | ICD-10-CM | POA: Diagnosis not present

## 2023-05-12 DIAGNOSIS — D688 Other specified coagulation defects: Secondary | ICD-10-CM | POA: Diagnosis not present

## 2023-05-12 DIAGNOSIS — E1129 Type 2 diabetes mellitus with other diabetic kidney complication: Secondary | ICD-10-CM | POA: Diagnosis not present

## 2023-05-12 DIAGNOSIS — N2581 Secondary hyperparathyroidism of renal origin: Secondary | ICD-10-CM | POA: Diagnosis not present

## 2023-05-12 DIAGNOSIS — D631 Anemia in chronic kidney disease: Secondary | ICD-10-CM | POA: Diagnosis not present

## 2023-05-12 NOTE — Progress Notes (Signed)
  Subjective:  Patient ID: Katie Garcia, female    DOB: 1940/12/29,  MRN: [id number]  Katie Garcia presents to clinic today for at risk foot care with h/o NIDDM with ESRD on hemodialysis and callus(es) both feet and painful thick toenails that are difficult to trim. Painful toenails interfere with ambulation. Aggravating factors include wearing enclosed shoe gear. Pain is relieved with periodic professional debridement. Painful calluses are aggravated when weightbearing with and without shoegear. Pain is relieved with periodic professional debridement.  Chief Complaint  Patient presents with   Nail Problem    DFC BS-did not check today A1C-do not remember PCP-Vardarajan PCP [facility]- 3 months ago   New problem(s): None.   PCP is Lorenda Ishihara, MD.  Allergies  Allergen Reactions   Penicillins Rash    Did it involve swelling of the face/tongue/throat, SOB, or low BP? No Did it involve sudden or severe rash/hives, skin peeling, or any reaction on the inside of your mouth or nose? Yes Did you need to seek medical attention at a hospital or doctor's office? N/A When did it last happen? N/A      If all above answers are "NO", may proceed with cephalosporin use.    Review of Systems: Negative except as noted in the HPI.  Objective:  There were no vitals filed for this visit. Katie Garcia is a pleasant 82 y.o. female obese in NAD. AAO x 3.  Vascular CFT <3 seconds b/l LE. Palpable DP pulse(s) b/l LE. Faintly palpable PT pulse(s) b/l LE. Pedal hair absent. No pain with calf compression b/l. Trace edema noted BLE.  Neurologic Normal speech. Oriented to person, place, and time. Protective sensation intact 5/5 intact bilaterally with 10g monofilament b/l. Vibratory sensation intact b/l.  Dermatologic Pedal integument with normal turgor, texture and tone b/l LE. No open wounds b/l. No interdigital macerations b/l. Toenails 1-5 b/l elongated, thickened, discolored with subungual debris.  +Tenderness with dorsal palpation of nailplates.  Hyperkeratotic lesion(s) noted submet head 5 BLE and distal tip of right 3rd toe.  Orthopedic: Normal muscle strength 5/5 to all lower extremity muscle groups bilaterally. Pes planus deformity noted bilateral LE.Marland KitchenNo pain, crepitus or joint limitation noted with ROM b/l LE.  Patient ambulates independently without assistive aids.   Radiographs: None  Assessment/Plan: 1. Pain due to onychomycosis of toenails of both feet   2. Diabetes mellitus due to underlying condition with chronic kidney disease on chronic dialysis, with long-term current use of insulin ([facility])   3. Corns and callosities   -Consent given for treatment as described below: -Examined patient. -Patient to continue soft, supportive shoe gear daily. -Toenails 1-5 b/l were debrided in length and girth with sterile nail nippers and dremel without iatrogenic bleeding.  -Corn(s) right third digit pared utilizing sterile scalpel blade without complication or incident. Total number debrided=1. -Callus(es) submet head 5 b/l pared utilizing sharp debridement with sterile blade without complication or incident. Total number debrided =2. -Patient/POA to call should there be question/concern in the interim.   Return in about 3 months (around 08/09/2023).  Freddie Breech, DPM

## 2023-05-14 ENCOUNTER — Other Ambulatory Visit: Payer: Medicare Other

## 2023-05-15 ENCOUNTER — Telehealth: Payer: Self-pay

## 2023-05-15 DIAGNOSIS — Z992 Dependence on renal dialysis: Secondary | ICD-10-CM | POA: Diagnosis not present

## 2023-05-15 DIAGNOSIS — N186 End stage renal disease: Secondary | ICD-10-CM | POA: Diagnosis not present

## 2023-05-15 DIAGNOSIS — E1129 Type 2 diabetes mellitus with other diabetic kidney complication: Secondary | ICD-10-CM | POA: Diagnosis not present

## 2023-05-15 DIAGNOSIS — R519 Headache, unspecified: Secondary | ICD-10-CM | POA: Diagnosis not present

## 2023-05-15 DIAGNOSIS — D688 Other specified coagulation defects: Secondary | ICD-10-CM | POA: Diagnosis not present

## 2023-05-15 DIAGNOSIS — N2581 Secondary hyperparathyroidism of renal origin: Secondary | ICD-10-CM | POA: Diagnosis not present

## 2023-05-15 DIAGNOSIS — D631 Anemia in chronic kidney disease: Secondary | ICD-10-CM | POA: Diagnosis not present

## 2023-05-15 DIAGNOSIS — E1165 Type 2 diabetes mellitus with hyperglycemia: Secondary | ICD-10-CM

## 2023-05-15 MED ORDER — DEXCOM G7 RECEIVER DEVI: 0 refills | Status: DC

## 2023-05-15 NOTE — Telephone Encounter (Signed)
Patient called and left a vm stating she needed a new receiver for her dexcom, due to it being "put in the laundry/washed with the laundry "  Requested Prescriptions   Signed Prescriptions Disp Refills   Continuous Glucose Receiver (DEXCOM G7 RECEIVER) DEVI 1 each 0    Sig: Use to monitor blood sugar.    Authorizing Provider: Reather Littler    Ordering User: Pollie Meyer

## 2023-05-16 ENCOUNTER — Encounter: Payer: Self-pay | Admitting: "Endocrinology

## 2023-05-16 ENCOUNTER — Ambulatory Visit (INDEPENDENT_AMBULATORY_CARE_PROVIDER_SITE_OTHER): Payer: Medicare Other | Admitting: "Endocrinology

## 2023-05-16 VITALS — BP 122/80 | HR 100 | Ht 60.0 in | Wt 201.0 lb

## 2023-05-16 DIAGNOSIS — E1165 Type 2 diabetes mellitus with hyperglycemia: Secondary | ICD-10-CM | POA: Diagnosis not present

## 2023-05-16 DIAGNOSIS — Z794 Long term (current) use of insulin: Secondary | ICD-10-CM | POA: Diagnosis not present

## 2023-05-16 DIAGNOSIS — E7800 Pure hypercholesterolemia, unspecified: Secondary | ICD-10-CM | POA: Diagnosis not present

## 2023-05-16 LAB — POCT GLYCOSYLATED HEMOGLOBIN (HGB A1C): Hemoglobin A1C: 6.4 % — AB (ref 4.0–5.6)

## 2023-05-16 NOTE — Progress Notes (Signed)
Outpatient Endocrinology Note Katie Antioch, MD  05/16/23   Katie Garcia 05/29/1941 [id number]  Referring Provider: Lorenda Ishihara,* Primary Care Provider: Lorenda Ishihara, MD Reason for consultation: Subjective   Assessment & Plan  Diagnoses and all orders for this visit:  Uncontrolled type 2 diabetes mellitus with hyperglycemia, with long-term current use of insulin (HCC) -     POCT glycosylated hemoglobin (Hb A1C) -     Comprehensive metabolic panel; Future -     Hemoglobin A1c; Future -     Lipid panel; Future -     Fructosamine  Long-term insulin use (HCC)  Pure hypercholesterolemia    Diabetes complicated by neuropathy, ESRD on HD Hba1c goal less than 7.0, current Hba1c is 7.3. Will recommend for the following change of medications to: 18 units Lantus in the afternoon, 18 R fifteen min before breakfast      Pt sometimes forgets to eat meal and sleeps, has 2 daughter to help remind Has baqsimi  No known contraindications to any of above medications  Hyperlipidemia -Last LDL near goal: 36 -on rosuvastatin 10 mg QD -Follow low fat diet and exercise   -Blood pressure goal <140/90 - Microalbumin/creatinine goal < 30 -not on ACE/ARB -ESRD on HD, deferred to nephrology -diet changes including salt restriction -limit eating outside -counseled BP targets per standards of diabetes care -Uncontrolled blood pressure can lead to retinopathy, nephropathy and cardiovascular and atherosclerotic heart disease  Reviewed and counseled on: -A1C target -Blood sugar targets -Complications of uncontrolled diabetes  -Checking blood sugar before meals and bedtime and bring log next visit -All medications with mechanism of action and side effects -Hypoglycemia management: rule of 15's, Glucagon Emergency Kit and medical alert ID -low-carb low-fat plate-method diet -At least 20 minutes of physical activity per day -Annual dilated retinal eye exam and  foot exam -compliance and follow up needs -follow up as scheduled or earlier if problem gets worse  Call if blood sugar is less than 70 or consistently above 250    Take a 15 gm snack of carbohydrate at bedtime before you go to sleep if your blood sugar is less than 100.    If you are going to fast after midnight for a test or procedure, ask your physician for instructions on how to reduce/decrease your insulin dose.    Call if blood sugar is less than 70 or consistently above 250  -Treating a low sugar by rule of 15  (15 gms of sugar every 15 min until sugar is more than 70) If you feel your sugar is low, test your sugar to be sure If your sugar is low (less than 70), then take 15 grams of a fast acting Carbohydrate (3-4 glucose tablets or glucose gel or 4 ounces of juice or regular soda) Recheck your sugar 15 min after treating low to make sure it is more than 70 If sugar is still less than 70, treat again with 15 grams of carbohydrate          Don't drive the hour of hypoglycemia  If unconscious/unable to eat or drink by mouth, use glucagon injection or nasal spray baqsimi and call 911. Can repeat again in 15 min if still unconscious.  Return in about 6 weeks (around 06/27/2023).   I have reviewed current medications, nurse's notes, allergies, vital signs, past medical and surgical history, family medical history, and social history for this encounter. Counseled patient on symptoms, examination findings, lab findings, imaging results, treatment decisions  and monitoring and prognosis. The patient understood the recommendations and agrees with the treatment plan. All questions regarding treatment plan were fully answered.  Katie Monowi, MD  05/16/23    History of Present Illness Katie Garcia is a 82 y.o. year old female who presents for evaluation of Type 2 diabetes mellitus.  Leeba Chillemi Mcguffee was first diagnosed in 1980.   Diabetes education + Has hypoglycemia awareness around 70,  DexCom beeps  Home diabetes regimen: 18 units Lantus in the afternoon, 18 R fifteen min before breakfast       Previous history:  Non-insulin hypoglycemic drugs previously used: Unknown Insulin was started soon after diagnosis, previously had been on Levemir, Tresiba, Lantus and NovoLog   COMPLICATIONS -  MI/Stroke -  retinopathy, last eye exam 2024 +  neuropathy +  nephropathy, ESRD on HD  BLOOD SUGAR DATA Checks once a day a home Ranges 56-250 per recall Did not bring DexCom receiver  Physical Exam  BP 122/80   Pulse 100   Ht 5' (1.524 m)   Wt 201 lb (91.2 kg)   SpO2 99%   BMI 39.26 kg/m    Constitutional: well developed, well nourished Head: normocephalic, atraumatic Eyes: sclera anicteric, no redness Neck: supple Lungs: normal respiratory effort Neurology: alert and oriented Skin: dry, no appreciable rashes Musculoskeletal: no appreciable defects Psychiatric: normal mood and affect Diabetic Foot Exam - Simple   No data filed      Current Medications Patient's Medications  New Prescriptions   No medications on file  Previous Medications   ACCU-CHEK FASTCLIX LANCETS MISC       ACCU-CHEK GUIDE TEST STRIP    1 each by Other route in the morning and at bedtime. E11.22   ACETAMINOPHEN (TYLENOL) 500 MG TABLET    Take 500-1,000 mg by mouth every 6 (six) hours as needed for moderate pain or headache.   ALLOPURINOL (ZYLOPRIM) 100 MG TABLET    Take 100 mg by mouth Every Tuesday,Thursday,and Saturday with dialysis. After hemodialysis   B COMPLEX-C-ZN-FOLIC ACID (DIALYVITE 800-ZINC 15) 0.8 MG TABS    Take 1 tablet by mouth daily with supper.   BLOOD GLUCOSE MONITORING SUPPL (ACCU-CHEK GUIDE) W/DEVICE KIT       CALCIUM ACETATE (PHOSLO) 667 MG CAPSULE    Take 1,334 mg by mouth 3 (three) times daily with meals.   CARVEDILOL (COREG) 25 MG TABLET    Take 25 mg by mouth in the morning and at bedtime.    CONTINUOUS GLUCOSE RECEIVER (DEXCOM G7 RECEIVER) DEVI    Use to monitor  blood sugar.   CONTINUOUS GLUCOSE SENSOR (DEXCOM G7 SENSOR) MISC    CHANGE EVERY 10 DAYS   FAMOTIDINE (PEPCID) 20 MG TABLET    Take 20 mg by mouth daily.   GUAIFENESIN (ROBITUSSIN) 100 MG/5ML SYRUP    Take 100 mg by mouth 3 (three) times daily as needed for cough.   INSULIN GLARGINE (LANTUS SOLOSTAR) 100 UNIT/ML SOLOSTAR PEN    INJECT 30 UNITS INTO THE SKIN DAILY. ADJUST AS DIRECTED   INSULIN LISPRO (HUMALOG KWIKPEN) 100 UNIT/ML KWIKPEN    18 units acbf   INSULIN PEN NEEDLE 31G X 5 MM MISC    Use for insulin   INSULIN REGULAR (NOVOLIN R) 100 UNITS/ML INJECTION    insulin regular human 100 unit/mL   LORATADINE (CLARITIN) 10 MG TABLET    Take 10 mg by mouth daily.   MECLIZINE (ANTIVERT) 25 MG TABLET    Take by mouth.  OXYCODONE-ACETAMINOPHEN (PERCOCET) 5-325 MG TABLET    Take 1 tablet by mouth every 6 (six) hours as needed for severe pain.   RELION INSULIN SYR 0.5ML/31G 31G X 5/16" 0.5 ML MISC    USE 4 times DAILY   ROSUVASTATIN (CRESTOR) 10 MG TABLET    Take 10 mg by mouth in the morning.   SYMBICORT 160-4.5 MCG/ACT INHALER    Inhale 2 puffs into the lungs 2 (two) times daily.  Modified Medications   No medications on file  Discontinued Medications   No medications on file    Allergies Allergies  Allergen Reactions   Penicillins Rash    Did it involve swelling of the face/tongue/throat, SOB, or low BP? No Did it involve sudden or severe rash/hives, skin peeling, or any reaction on the inside of your mouth or nose? Yes Did you need to seek medical attention at a hospital or doctor's office? N/A When did it last happen? N/A      If all above answers are "NO", may proceed with cephalosporin use.    Past Medical History Past Medical History:  Diagnosis Date   Anemia    Arthritis    COVID    hospitalized   Diabetes mellitus without complication (HCC)    ESRD    Dialysis T/Th/Sa [address]   GERD (gastroesophageal reflux disease)    Gout    Headache    Hypertension    Type 2  diabetes mellitus [facility])     Past Surgical History Past Surgical History:  Procedure Laterality Date   A/V FISTULAGRAM Left 04/05/2022   Procedure: A/V Fistulagram;  Surgeon: Victorino Sparrow, MD;  Location: [facility] INVASIVE CV LAB;  Service: Cardiovascular;  Laterality: Left;   ABDOMINAL HYSTERECTOMY     AV FISTULA PLACEMENT Left 10/02/2016   Procedure: ARTERIOVENOUS (AV) FISTULA CREATION LEFT UPPER ARM;  Surgeon: Maeola Harman, MD;  Location: [facility] OR;  Service: Vascular;  Laterality: Left;   AV FISTULA PLACEMENT Right 07/14/2020   Procedure: RIGHT ARM ARTERIOVENOUS FISTULA CREATION;  Surgeon: Maeola Harman, MD;  Location: [facility] OR;  Service: Vascular;  Laterality: Right;   AV FISTULA PLACEMENT Left 05/16/2021   Procedure: INSERTION OF LEFT UPPER ARM ARTERIOVENOUS (AV) GORE-TEX GRAFT;  Surgeon: Cephus Shelling, MD;  Location: [facility] OR;  Service: Vascular;  Laterality: Left;   AV FISTULA PLACEMENT Right 07/13/2021   Procedure: RIGHT ARM ARTERIOVENOUS (AV) FISTULA GRAFT INSERTION;  Surgeon: Maeola Harman, MD;  Location: [facility] OR;  Service: Vascular;  Laterality: Right;   AV FISTULA PLACEMENT Left 02/03/2022   Procedure: LEFT UPPER ARM ARTERIOVENOUS FISTULA CREATION;  Surgeon: Maeola Harman, MD;  Location: [facility] OR;  Service: Vascular;  Laterality: Left;   AV FISTULA PLACEMENT Left 06/23/2022   Procedure: INSERTION OF LEFT ARM ARTERIOVENOUS (AV) GORE-TEX GRAFT;  Surgeon: Maeola Harman, MD;  [facility] OR;  Service: Vascular;  Laterality: Left;   BASCILIC VEIN TRANSPOSITION Left 05/17/2020   Procedure: Insertion of Left arm arteriovenous gortex graftarm ;  Surgeon: Chuck Hint, MD;  Location: [facility] OR;  Service: Vascular;  Laterality: Left;   CESAREAN SECTION     DIALYSIS/PERMA CATHETER INSERTION     IR FLUORO GUIDE CV LINE LEFT  02/09/2022   IR PTA VENOUS EXCEPT DIALYSIS CIRCUIT  02/09/2022   IR VENOCAVAGRAM SVC  02/09/2022   REVISON OF  ARTERIOVENOUS FISTULA Right 09/15/2020   Procedure: REVISON OF RIGHT UPPER ARM  ARTERIOVENOUS FISTULA;  Surgeon: Maeola Harman, MD;  Location: [facility] OR;  Service: Vascular;  Laterality: Right;   UPPER EXTREMITY VENOGRAPHY Bilateral 04/18/2021   Procedure: UPPER EXTREMITY VENOGRAPHY;  Surgeon: Maeola Harman, MD;  Location: [facility] INVASIVE CV LAB;  Service: Cardiovascular;  Laterality: Bilateral;   UPPER EXTREMITY VENOGRAPHY Left 01/23/2022   Procedure: UPPER EXTREMITY VENOGRAPHY;  Surgeon: Maeola Harman, MD;  Location: [facility] INVASIVE CV LAB;  Service: Cardiovascular;  Laterality: Left;    Family History family history includes Diabetes in her mother.  Social History Social History   Socioeconomic History   Marital status: Married    Spouse name: Not on file   Number of children: Not on file   Years of education: Not on file   Highest education level: Not on file  Occupational History   Not on file  Tobacco Use   Smoking status: Never   Smokeless tobacco: Never  Vaping Use   Vaping Use: Never used  Substance and Sexual Activity   Alcohol use: Not Currently   Drug use: No   Sexual activity: Not on file  Other Topics Concern   Not on file  Social History Narrative   Not on file   Social Determinants of Health   Financial Resource Strain: Not on file  Food Insecurity: No Food Insecurity (07/19/2022)   Hunger Vital Sign    Worried About Running Out of Food in the Last Year: Never true    Ran Out of Food in the Last Year: Never true  Transportation Needs: No Transportation Needs (07/19/2022)   PRAPARE - Administrator, Civil Service (Medical): No    Lack of Transportation (Non-Medical): No  Physical Activity: Not on file  Stress: Not on file  Social Connections: Not on file  Intimate Partner Violence: Not on file    Lab Results  Component Value Date   HGBA1C 6.4 (A) 05/16/2023   Lab Results  Component Value Date   CHOL 156 07/17/2022    Lab Results  Component Value Date   HDL 59.60 07/17/2022   Lab Results  Component Value Date   LDLCALC 83 07/17/2022   Lab Results  Component Value Date   TRIG 65.0 07/17/2022   Lab Results  Component Value Date   CHOLHDL 3 07/17/2022   Lab Results  Component Value Date   CREATININE 7.10 (H) 06/23/2022   No results found for: "GFR" No results found for: "MICROALBUR", "[id number]"    Component Value Date/Time   NA 137 06/23/2022 0552   K 4.1 06/23/2022 0552   CL 103 06/23/2022 0552   CO2 29 10/13/2021 1231   GLUCOSE 187 (H) 02/02/2023 0913   BUN 30 (H) 06/23/2022 0552   CREATININE 7.10 (H) 06/23/2022 0552   CALCIUM 9.3 10/13/2021 1231   CALCIUM 7.3 (L) 09/17/2018 0941   PROT 7.5 10/13/2021 1231   ALBUMIN 3.5 10/13/2021 1231   AST 29 10/13/2021 1231   ALT 19 10/13/2021 1231   ALKPHOS 67 10/13/2021 1231   BILITOT 0.7 10/13/2021 1231   GFRNONAA 9 (L) 10/13/2021 1231   GFRAA 5 (L) 12/20/2019 0500      Latest Ref Rng & Units 02/02/2023    9:13 AM 10/18/2022   10:06 AM 07/17/2022   10:08 AM  BMP  Glucose 70 - 99 mg/dL 161  096  045        Component Value Date/Time   WBC 7.0 10/13/2021 1231   RBC 4.58 10/13/2021 1231   HGB 13.6 06/23/2022 0552   HCT 40.0 06/23/2022  0552   PLT 126 (L) 10/13/2021 1231   MCV 92.1 10/13/2021 1231   MCH 27.9 10/13/2021 1231   MCHC 30.3 10/13/2021 1231   RDW 17.0 (H) 10/13/2021 1231   LYMPHSABS 1.6 10/13/2021 1231   MONOABS 0.6 10/13/2021 1231   EOSABS 0.0 10/13/2021 1231   BASOSABS 0.1 10/13/2021 1231     Parts of this note may have been dictated using voice recognition software. There may be variances in spelling and vocabulary which are unintentional. Not all errors are proofread. Please notify the Thereasa Parkin if any discrepancies are noted or if the meaning of any statement is not clear.

## 2023-05-16 NOTE — Patient Instructions (Signed)

## 2023-05-17 DIAGNOSIS — N186 End stage renal disease: Secondary | ICD-10-CM | POA: Diagnosis not present

## 2023-05-17 DIAGNOSIS — E1129 Type 2 diabetes mellitus with other diabetic kidney complication: Secondary | ICD-10-CM | POA: Diagnosis not present

## 2023-05-17 DIAGNOSIS — D688 Other specified coagulation defects: Secondary | ICD-10-CM | POA: Diagnosis not present

## 2023-05-17 DIAGNOSIS — T82868A Thrombosis of vascular prosthetic devices, implants and grafts, initial encounter: Secondary | ICD-10-CM | POA: Diagnosis not present

## 2023-05-17 DIAGNOSIS — I871 Compression of vein: Secondary | ICD-10-CM | POA: Diagnosis not present

## 2023-05-17 DIAGNOSIS — N2581 Secondary hyperparathyroidism of renal origin: Secondary | ICD-10-CM | POA: Diagnosis not present

## 2023-05-17 DIAGNOSIS — Z992 Dependence on renal dialysis: Secondary | ICD-10-CM | POA: Diagnosis not present

## 2023-05-17 DIAGNOSIS — D631 Anemia in chronic kidney disease: Secondary | ICD-10-CM | POA: Diagnosis not present

## 2023-05-17 DIAGNOSIS — R519 Headache, unspecified: Secondary | ICD-10-CM | POA: Diagnosis not present

## 2023-05-21 DIAGNOSIS — D688 Other specified coagulation defects: Secondary | ICD-10-CM | POA: Diagnosis not present

## 2023-05-21 DIAGNOSIS — Z992 Dependence on renal dialysis: Secondary | ICD-10-CM | POA: Diagnosis not present

## 2023-05-21 DIAGNOSIS — R519 Headache, unspecified: Secondary | ICD-10-CM | POA: Diagnosis not present

## 2023-05-21 DIAGNOSIS — E1129 Type 2 diabetes mellitus with other diabetic kidney complication: Secondary | ICD-10-CM | POA: Diagnosis not present

## 2023-05-21 DIAGNOSIS — N2581 Secondary hyperparathyroidism of renal origin: Secondary | ICD-10-CM | POA: Diagnosis not present

## 2023-05-21 DIAGNOSIS — D631 Anemia in chronic kidney disease: Secondary | ICD-10-CM | POA: Diagnosis not present

## 2023-05-21 DIAGNOSIS — N186 End stage renal disease: Secondary | ICD-10-CM | POA: Diagnosis not present

## 2023-05-22 DIAGNOSIS — D688 Other specified coagulation defects: Secondary | ICD-10-CM | POA: Diagnosis not present

## 2023-05-22 DIAGNOSIS — R519 Headache, unspecified: Secondary | ICD-10-CM | POA: Diagnosis not present

## 2023-05-22 DIAGNOSIS — N186 End stage renal disease: Secondary | ICD-10-CM | POA: Diagnosis not present

## 2023-05-22 DIAGNOSIS — E1129 Type 2 diabetes mellitus with other diabetic kidney complication: Secondary | ICD-10-CM | POA: Diagnosis not present

## 2023-05-22 DIAGNOSIS — D631 Anemia in chronic kidney disease: Secondary | ICD-10-CM | POA: Diagnosis not present

## 2023-05-22 DIAGNOSIS — N2581 Secondary hyperparathyroidism of renal origin: Secondary | ICD-10-CM | POA: Diagnosis not present

## 2023-05-22 DIAGNOSIS — Z992 Dependence on renal dialysis: Secondary | ICD-10-CM | POA: Diagnosis not present

## 2023-05-24 DIAGNOSIS — R519 Headache, unspecified: Secondary | ICD-10-CM | POA: Diagnosis not present

## 2023-05-24 DIAGNOSIS — Z992 Dependence on renal dialysis: Secondary | ICD-10-CM | POA: Diagnosis not present

## 2023-05-24 DIAGNOSIS — N2581 Secondary hyperparathyroidism of renal origin: Secondary | ICD-10-CM | POA: Diagnosis not present

## 2023-05-24 DIAGNOSIS — E1129 Type 2 diabetes mellitus with other diabetic kidney complication: Secondary | ICD-10-CM | POA: Diagnosis not present

## 2023-05-24 DIAGNOSIS — N186 End stage renal disease: Secondary | ICD-10-CM | POA: Diagnosis not present

## 2023-05-24 DIAGNOSIS — D688 Other specified coagulation defects: Secondary | ICD-10-CM | POA: Diagnosis not present

## 2023-05-24 DIAGNOSIS — D631 Anemia in chronic kidney disease: Secondary | ICD-10-CM | POA: Diagnosis not present

## 2023-05-25 DIAGNOSIS — E1122 Type 2 diabetes mellitus with diabetic chronic kidney disease: Secondary | ICD-10-CM | POA: Diagnosis not present

## 2023-05-25 DIAGNOSIS — N186 End stage renal disease: Secondary | ICD-10-CM | POA: Diagnosis not present

## 2023-05-25 DIAGNOSIS — Z992 Dependence on renal dialysis: Secondary | ICD-10-CM | POA: Diagnosis not present

## 2023-05-26 DIAGNOSIS — N186 End stage renal disease: Secondary | ICD-10-CM | POA: Diagnosis not present

## 2023-05-26 DIAGNOSIS — R519 Headache, unspecified: Secondary | ICD-10-CM | POA: Diagnosis not present

## 2023-05-26 DIAGNOSIS — E1129 Type 2 diabetes mellitus with other diabetic kidney complication: Secondary | ICD-10-CM | POA: Diagnosis not present

## 2023-05-26 DIAGNOSIS — D688 Other specified coagulation defects: Secondary | ICD-10-CM | POA: Diagnosis not present

## 2023-05-26 DIAGNOSIS — D509 Iron deficiency anemia, unspecified: Secondary | ICD-10-CM | POA: Diagnosis not present

## 2023-05-26 DIAGNOSIS — Z992 Dependence on renal dialysis: Secondary | ICD-10-CM | POA: Diagnosis not present

## 2023-05-26 DIAGNOSIS — N2581 Secondary hyperparathyroidism of renal origin: Secondary | ICD-10-CM | POA: Diagnosis not present

## 2023-05-29 DIAGNOSIS — N186 End stage renal disease: Secondary | ICD-10-CM | POA: Diagnosis not present

## 2023-05-29 DIAGNOSIS — N2581 Secondary hyperparathyroidism of renal origin: Secondary | ICD-10-CM | POA: Diagnosis not present

## 2023-05-29 DIAGNOSIS — R519 Headache, unspecified: Secondary | ICD-10-CM | POA: Diagnosis not present

## 2023-05-29 DIAGNOSIS — D688 Other specified coagulation defects: Secondary | ICD-10-CM | POA: Diagnosis not present

## 2023-05-29 DIAGNOSIS — Z992 Dependence on renal dialysis: Secondary | ICD-10-CM | POA: Diagnosis not present

## 2023-05-29 DIAGNOSIS — D509 Iron deficiency anemia, unspecified: Secondary | ICD-10-CM | POA: Diagnosis not present

## 2023-05-29 DIAGNOSIS — E1129 Type 2 diabetes mellitus with other diabetic kidney complication: Secondary | ICD-10-CM | POA: Diagnosis not present

## 2023-05-31 DIAGNOSIS — D688 Other specified coagulation defects: Secondary | ICD-10-CM | POA: Diagnosis not present

## 2023-05-31 DIAGNOSIS — R519 Headache, unspecified: Secondary | ICD-10-CM | POA: Diagnosis not present

## 2023-05-31 DIAGNOSIS — N186 End stage renal disease: Secondary | ICD-10-CM | POA: Diagnosis not present

## 2023-05-31 DIAGNOSIS — Z992 Dependence on renal dialysis: Secondary | ICD-10-CM | POA: Diagnosis not present

## 2023-05-31 DIAGNOSIS — D509 Iron deficiency anemia, unspecified: Secondary | ICD-10-CM | POA: Diagnosis not present

## 2023-05-31 DIAGNOSIS — E1129 Type 2 diabetes mellitus with other diabetic kidney complication: Secondary | ICD-10-CM | POA: Diagnosis not present

## 2023-05-31 DIAGNOSIS — N2581 Secondary hyperparathyroidism of renal origin: Secondary | ICD-10-CM | POA: Diagnosis not present

## 2023-06-02 DIAGNOSIS — N186 End stage renal disease: Secondary | ICD-10-CM | POA: Diagnosis not present

## 2023-06-02 DIAGNOSIS — R519 Headache, unspecified: Secondary | ICD-10-CM | POA: Diagnosis not present

## 2023-06-02 DIAGNOSIS — Z992 Dependence on renal dialysis: Secondary | ICD-10-CM | POA: Diagnosis not present

## 2023-06-02 DIAGNOSIS — N2581 Secondary hyperparathyroidism of renal origin: Secondary | ICD-10-CM | POA: Diagnosis not present

## 2023-06-02 DIAGNOSIS — D509 Iron deficiency anemia, unspecified: Secondary | ICD-10-CM | POA: Diagnosis not present

## 2023-06-02 DIAGNOSIS — D688 Other specified coagulation defects: Secondary | ICD-10-CM | POA: Diagnosis not present

## 2023-06-02 DIAGNOSIS — E1129 Type 2 diabetes mellitus with other diabetic kidney complication: Secondary | ICD-10-CM | POA: Diagnosis not present

## 2023-06-04 ENCOUNTER — Other Ambulatory Visit: Payer: Self-pay | Admitting: Endocrinology

## 2023-06-05 DIAGNOSIS — Z992 Dependence on renal dialysis: Secondary | ICD-10-CM | POA: Diagnosis not present

## 2023-06-05 DIAGNOSIS — E1129 Type 2 diabetes mellitus with other diabetic kidney complication: Secondary | ICD-10-CM | POA: Diagnosis not present

## 2023-06-05 DIAGNOSIS — N2581 Secondary hyperparathyroidism of renal origin: Secondary | ICD-10-CM | POA: Diagnosis not present

## 2023-06-05 DIAGNOSIS — D509 Iron deficiency anemia, unspecified: Secondary | ICD-10-CM | POA: Diagnosis not present

## 2023-06-05 DIAGNOSIS — D688 Other specified coagulation defects: Secondary | ICD-10-CM | POA: Diagnosis not present

## 2023-06-05 DIAGNOSIS — R519 Headache, unspecified: Secondary | ICD-10-CM | POA: Diagnosis not present

## 2023-06-05 DIAGNOSIS — N186 End stage renal disease: Secondary | ICD-10-CM | POA: Diagnosis not present

## 2023-06-07 DIAGNOSIS — D509 Iron deficiency anemia, unspecified: Secondary | ICD-10-CM | POA: Diagnosis not present

## 2023-06-07 DIAGNOSIS — E1129 Type 2 diabetes mellitus with other diabetic kidney complication: Secondary | ICD-10-CM | POA: Diagnosis not present

## 2023-06-07 DIAGNOSIS — D688 Other specified coagulation defects: Secondary | ICD-10-CM | POA: Diagnosis not present

## 2023-06-07 DIAGNOSIS — R519 Headache, unspecified: Secondary | ICD-10-CM | POA: Diagnosis not present

## 2023-06-07 DIAGNOSIS — N186 End stage renal disease: Secondary | ICD-10-CM | POA: Diagnosis not present

## 2023-06-07 DIAGNOSIS — Z992 Dependence on renal dialysis: Secondary | ICD-10-CM | POA: Diagnosis not present

## 2023-06-07 DIAGNOSIS — N2581 Secondary hyperparathyroidism of renal origin: Secondary | ICD-10-CM | POA: Diagnosis not present

## 2023-06-09 DIAGNOSIS — E1129 Type 2 diabetes mellitus with other diabetic kidney complication: Secondary | ICD-10-CM | POA: Diagnosis not present

## 2023-06-09 DIAGNOSIS — Z992 Dependence on renal dialysis: Secondary | ICD-10-CM | POA: Diagnosis not present

## 2023-06-09 DIAGNOSIS — R519 Headache, unspecified: Secondary | ICD-10-CM | POA: Diagnosis not present

## 2023-06-09 DIAGNOSIS — D688 Other specified coagulation defects: Secondary | ICD-10-CM | POA: Diagnosis not present

## 2023-06-09 DIAGNOSIS — D509 Iron deficiency anemia, unspecified: Secondary | ICD-10-CM | POA: Diagnosis not present

## 2023-06-09 DIAGNOSIS — N2581 Secondary hyperparathyroidism of renal origin: Secondary | ICD-10-CM | POA: Diagnosis not present

## 2023-06-09 DIAGNOSIS — N186 End stage renal disease: Secondary | ICD-10-CM | POA: Diagnosis not present

## 2023-06-12 DIAGNOSIS — N186 End stage renal disease: Secondary | ICD-10-CM | POA: Diagnosis not present

## 2023-06-12 DIAGNOSIS — N2581 Secondary hyperparathyroidism of renal origin: Secondary | ICD-10-CM | POA: Diagnosis not present

## 2023-06-12 DIAGNOSIS — Z992 Dependence on renal dialysis: Secondary | ICD-10-CM | POA: Diagnosis not present

## 2023-06-12 DIAGNOSIS — R519 Headache, unspecified: Secondary | ICD-10-CM | POA: Diagnosis not present

## 2023-06-12 DIAGNOSIS — D509 Iron deficiency anemia, unspecified: Secondary | ICD-10-CM | POA: Diagnosis not present

## 2023-06-12 DIAGNOSIS — D688 Other specified coagulation defects: Secondary | ICD-10-CM | POA: Diagnosis not present

## 2023-06-12 DIAGNOSIS — E1129 Type 2 diabetes mellitus with other diabetic kidney complication: Secondary | ICD-10-CM | POA: Diagnosis not present

## 2023-06-14 DIAGNOSIS — Z992 Dependence on renal dialysis: Secondary | ICD-10-CM | POA: Diagnosis not present

## 2023-06-14 DIAGNOSIS — D688 Other specified coagulation defects: Secondary | ICD-10-CM | POA: Diagnosis not present

## 2023-06-14 DIAGNOSIS — E1129 Type 2 diabetes mellitus with other diabetic kidney complication: Secondary | ICD-10-CM | POA: Diagnosis not present

## 2023-06-14 DIAGNOSIS — D509 Iron deficiency anemia, unspecified: Secondary | ICD-10-CM | POA: Diagnosis not present

## 2023-06-14 DIAGNOSIS — N186 End stage renal disease: Secondary | ICD-10-CM | POA: Diagnosis not present

## 2023-06-14 DIAGNOSIS — N2581 Secondary hyperparathyroidism of renal origin: Secondary | ICD-10-CM | POA: Diagnosis not present

## 2023-06-14 DIAGNOSIS — R519 Headache, unspecified: Secondary | ICD-10-CM | POA: Diagnosis not present

## 2023-06-16 DIAGNOSIS — Z992 Dependence on renal dialysis: Secondary | ICD-10-CM | POA: Diagnosis not present

## 2023-06-16 DIAGNOSIS — N2581 Secondary hyperparathyroidism of renal origin: Secondary | ICD-10-CM | POA: Diagnosis not present

## 2023-06-16 DIAGNOSIS — E1129 Type 2 diabetes mellitus with other diabetic kidney complication: Secondary | ICD-10-CM | POA: Diagnosis not present

## 2023-06-16 DIAGNOSIS — R519 Headache, unspecified: Secondary | ICD-10-CM | POA: Diagnosis not present

## 2023-06-16 DIAGNOSIS — N186 End stage renal disease: Secondary | ICD-10-CM | POA: Diagnosis not present

## 2023-06-16 DIAGNOSIS — D688 Other specified coagulation defects: Secondary | ICD-10-CM | POA: Diagnosis not present

## 2023-06-16 DIAGNOSIS — D509 Iron deficiency anemia, unspecified: Secondary | ICD-10-CM | POA: Diagnosis not present

## 2023-06-19 DIAGNOSIS — E1129 Type 2 diabetes mellitus with other diabetic kidney complication: Secondary | ICD-10-CM | POA: Diagnosis not present

## 2023-06-19 DIAGNOSIS — D509 Iron deficiency anemia, unspecified: Secondary | ICD-10-CM | POA: Diagnosis not present

## 2023-06-19 DIAGNOSIS — Z992 Dependence on renal dialysis: Secondary | ICD-10-CM | POA: Diagnosis not present

## 2023-06-19 DIAGNOSIS — D688 Other specified coagulation defects: Secondary | ICD-10-CM | POA: Diagnosis not present

## 2023-06-19 DIAGNOSIS — N2581 Secondary hyperparathyroidism of renal origin: Secondary | ICD-10-CM | POA: Diagnosis not present

## 2023-06-19 DIAGNOSIS — N186 End stage renal disease: Secondary | ICD-10-CM | POA: Diagnosis not present

## 2023-06-19 DIAGNOSIS — R519 Headache, unspecified: Secondary | ICD-10-CM | POA: Diagnosis not present

## 2023-06-21 DIAGNOSIS — D509 Iron deficiency anemia, unspecified: Secondary | ICD-10-CM | POA: Diagnosis not present

## 2023-06-21 DIAGNOSIS — N2581 Secondary hyperparathyroidism of renal origin: Secondary | ICD-10-CM | POA: Diagnosis not present

## 2023-06-21 DIAGNOSIS — D688 Other specified coagulation defects: Secondary | ICD-10-CM | POA: Diagnosis not present

## 2023-06-21 DIAGNOSIS — Z992 Dependence on renal dialysis: Secondary | ICD-10-CM | POA: Diagnosis not present

## 2023-06-21 DIAGNOSIS — N186 End stage renal disease: Secondary | ICD-10-CM | POA: Diagnosis not present

## 2023-06-21 DIAGNOSIS — E1129 Type 2 diabetes mellitus with other diabetic kidney complication: Secondary | ICD-10-CM | POA: Diagnosis not present

## 2023-06-21 DIAGNOSIS — R519 Headache, unspecified: Secondary | ICD-10-CM | POA: Diagnosis not present

## 2023-06-22 ENCOUNTER — Telehealth: Payer: Self-pay | Admitting: "Endocrinology

## 2023-06-22 ENCOUNTER — Other Ambulatory Visit (INDEPENDENT_AMBULATORY_CARE_PROVIDER_SITE_OTHER): Payer: Medicare Other

## 2023-06-22 DIAGNOSIS — E1165 Type 2 diabetes mellitus with hyperglycemia: Secondary | ICD-10-CM | POA: Diagnosis not present

## 2023-06-22 DIAGNOSIS — Z794 Long term (current) use of insulin: Secondary | ICD-10-CM

## 2023-06-22 LAB — MICROALBUMIN / CREATININE URINE RATIO
Creatinine,U: 196.9 mg/dL
Microalb Creat Ratio: 44.5 mg/g — ABNORMAL HIGH (ref 0.0–30.0)
Microalb, Ur: 87.6 mg/dL — ABNORMAL HIGH (ref 0.0–1.9)

## 2023-06-22 LAB — COMPREHENSIVE METABOLIC PANEL
ALT: 14 U/L (ref 0–35)
AST: 23 U/L (ref 0–37)
Albumin: 3.8 g/dL (ref 3.5–5.2)
Alkaline Phosphatase: 91 U/L (ref 39–117)
BUN: 30 mg/dL — ABNORMAL HIGH (ref 6–23)
CO2: 30 mEq/L (ref 19–32)
Calcium: 10.1 mg/dL (ref 8.4–10.5)
Chloride: 97 mEq/L (ref 96–112)
Creatinine, Ser: 6.72 mg/dL (ref 0.40–1.20)
GFR: 5.42 mL/min — CL (ref >60.00)
Glucose, Bld: 172 mg/dL — ABNORMAL HIGH (ref 70–99)
Potassium: 4.3 mEq/L (ref 3.5–5.1)
Sodium: 143 mEq/L (ref 135–145)
Total Bilirubin: 0.4 mg/dL (ref 0.2–1.2)
Total Protein: 7 g/dL (ref 6.0–8.3)

## 2023-06-22 LAB — LIPID PANEL
Cholesterol: 197 mg/dL (ref 0–200)
HDL: 54.8 mg/dL (ref >39.00)
LDL Cholesterol: 124 mg/dL — ABNORMAL HIGH (ref 0–99)
NonHDL: 142.5
Total CHOL/HDL Ratio: 4
Triglycerides: 91 mg/dL (ref 0.0–149.0)
VLDL: 18.2 mg/dL (ref 0.0–40.0)

## 2023-06-22 LAB — HEMOGLOBIN A1C: Hgb A1c MFr Bld: 6.9 % — ABNORMAL HIGH (ref 4.6–6.5)

## 2023-06-22 NOTE — Telephone Encounter (Signed)
[facility] calling with critical lab value - gave information to Soledad Gerlach

## 2023-06-22 NOTE — Telephone Encounter (Signed)
Sau from [facility] calling with Critical Lab results on Katie Garcia Creatinine 6.72 and GFR 5.42 , please advise?

## 2023-06-22 NOTE — Telephone Encounter (Signed)
Dr Roosevelt Locks was contacted by phone as well

## 2023-06-23 DIAGNOSIS — N2581 Secondary hyperparathyroidism of renal origin: Secondary | ICD-10-CM | POA: Diagnosis not present

## 2023-06-23 DIAGNOSIS — Z992 Dependence on renal dialysis: Secondary | ICD-10-CM | POA: Diagnosis not present

## 2023-06-23 DIAGNOSIS — E1129 Type 2 diabetes mellitus with other diabetic kidney complication: Secondary | ICD-10-CM | POA: Diagnosis not present

## 2023-06-23 DIAGNOSIS — D509 Iron deficiency anemia, unspecified: Secondary | ICD-10-CM | POA: Diagnosis not present

## 2023-06-23 DIAGNOSIS — D688 Other specified coagulation defects: Secondary | ICD-10-CM | POA: Diagnosis not present

## 2023-06-23 DIAGNOSIS — N186 End stage renal disease: Secondary | ICD-10-CM | POA: Diagnosis not present

## 2023-06-23 DIAGNOSIS — R519 Headache, unspecified: Secondary | ICD-10-CM | POA: Diagnosis not present

## 2023-06-24 DIAGNOSIS — E1122 Type 2 diabetes mellitus with diabetic chronic kidney disease: Secondary | ICD-10-CM | POA: Diagnosis not present

## 2023-06-24 DIAGNOSIS — N186 End stage renal disease: Secondary | ICD-10-CM | POA: Diagnosis not present

## 2023-06-24 DIAGNOSIS — Z992 Dependence on renal dialysis: Secondary | ICD-10-CM | POA: Diagnosis not present

## 2023-06-26 DIAGNOSIS — E1129 Type 2 diabetes mellitus with other diabetic kidney complication: Secondary | ICD-10-CM | POA: Diagnosis not present

## 2023-06-26 DIAGNOSIS — R519 Headache, unspecified: Secondary | ICD-10-CM | POA: Diagnosis not present

## 2023-06-26 DIAGNOSIS — N186 End stage renal disease: Secondary | ICD-10-CM | POA: Diagnosis not present

## 2023-06-26 DIAGNOSIS — D631 Anemia in chronic kidney disease: Secondary | ICD-10-CM | POA: Diagnosis not present

## 2023-06-26 DIAGNOSIS — D688 Other specified coagulation defects: Secondary | ICD-10-CM | POA: Diagnosis not present

## 2023-06-26 DIAGNOSIS — Z992 Dependence on renal dialysis: Secondary | ICD-10-CM | POA: Diagnosis not present

## 2023-06-26 DIAGNOSIS — N2581 Secondary hyperparathyroidism of renal origin: Secondary | ICD-10-CM | POA: Diagnosis not present

## 2023-06-26 DIAGNOSIS — D509 Iron deficiency anemia, unspecified: Secondary | ICD-10-CM | POA: Diagnosis not present

## 2023-06-27 ENCOUNTER — Ambulatory Visit (INDEPENDENT_AMBULATORY_CARE_PROVIDER_SITE_OTHER): Payer: Medicare Other | Admitting: "Endocrinology

## 2023-06-27 ENCOUNTER — Encounter: Payer: Self-pay | Admitting: "Endocrinology

## 2023-06-27 VITALS — BP 130/70 | HR 109 | Ht 61.0 in | Wt 202.8 lb

## 2023-06-27 DIAGNOSIS — E1165 Type 2 diabetes mellitus with hyperglycemia: Secondary | ICD-10-CM | POA: Diagnosis not present

## 2023-06-27 DIAGNOSIS — E113212 Type 2 diabetes mellitus with mild nonproliferative diabetic retinopathy with macular edema, left eye: Secondary | ICD-10-CM | POA: Diagnosis not present

## 2023-06-27 DIAGNOSIS — E7800 Pure hypercholesterolemia, unspecified: Secondary | ICD-10-CM | POA: Diagnosis not present

## 2023-06-27 DIAGNOSIS — Z794 Long term (current) use of insulin: Secondary | ICD-10-CM

## 2023-06-27 NOTE — Progress Notes (Signed)
Outpatient Endocrinology Note Katie Hartman, MD  06/27/23   Katie Garcia 17-Mar-1941 [id number]  Referring Provider: Lorenda Ishihara,* Primary Care Provider: Lorenda Ishihara, MD Reason for consultation: Subjective   Assessment & Plan  Tanyiah was seen today for diabetes.  Diagnoses and all orders for this visit:  Uncontrolled type 2 diabetes mellitus with hyperglycemia, with long-term current use of insulin (HCC)  Pure hypercholesterolemia     Diabetes complicated by neuropathy, ESRD on HD Hba1c goal less than 7.0, current Hba1c is 6.9 Will recommend for the following change of medications to: 18 units Lantus in the afternoon, 20 Novolin R insulin 1-2 hours after break fast   Cannot afford Humalog/novolog  Pt sometimes forgets to eat meal and sleeps, has 2 daughter to help remind Reinforced compliance  Has baqsimi  No known contraindications to any of above medications  Hyperlipidemia -Last LDL near goal: 124 -not taking rosuvastatin 10 mg every day, reinforced compliance  -Follow low fat diet and exercise   -Blood pressure goal <140/90 - Microalbumin/creatinine goal < 30 -not on ACE/ARB -ESRD on HD, deferred to nephrology -diet changes including salt restriction -limit eating outside -counseled BP targets per standards of diabetes care -Uncontrolled blood pressure can lead to retinopathy, nephropathy and cardiovascular and atherosclerotic heart disease  Reviewed and counseled on: -A1C target -Blood sugar targets -Complications of uncontrolled diabetes  -Checking blood sugar before meals and bedtime and bring log next visit -All medications with mechanism of action and side effects -Hypoglycemia management: rule of 15's, Glucagon Emergency Kit and medical alert ID -low-carb low-fat plate-method diet -At least 20 minutes of physical activity per day -Annual dilated retinal eye exam and foot exam -compliance and follow up needs -follow  up as scheduled or earlier if problem gets worse  Call if blood sugar is less than 70 or consistently above 250    Take a 15 gm snack of carbohydrate at bedtime before you go to sleep if your blood sugar is less than 100.    If you are going to fast after midnight for a test or procedure, ask your physician for instructions on how to reduce/decrease your insulin dose.    Call if blood sugar is less than 70 or consistently above 250  -Treating a low sugar by rule of 15  (15 gms of sugar every 15 min until sugar is more than 70) If you feel your sugar is low, test your sugar to be sure If your sugar is low (less than 70), then take 15 grams of a fast acting Carbohydrate (3-4 glucose tablets or glucose gel or 4 ounces of juice or regular soda) Recheck your sugar 15 min after treating low to make sure it is more than 70 If sugar is still less than 70, treat again with 15 grams of carbohydrate          Don't drive the hour of hypoglycemia  If unconscious/unable to eat or drink by mouth, use glucagon injection or nasal spray baqsimi and call 911. Can repeat again in 15 min if still unconscious.  Return in about 4 months (around 10/28/2023).   I have reviewed current medications, nurse's notes, allergies, vital signs, past medical and surgical history, family medical history, and social history for this encounter. Counseled patient on symptoms, examination findings, lab findings, imaging results, treatment decisions and monitoring and prognosis. The patient understood the recommendations and agrees with the treatment plan. All questions regarding treatment plan were fully answered.  Devansh Riese,  MD  06/27/23    History of Present Illness Katie Garcia is a 82 y.o. year old female who presents for evaluation of Type 2 diabetes mellitus.  Donzell Hirani Moncayo was first diagnosed in 1980.   Diabetes education + Has hypoglycemia awareness around 70, DexCom beeps  Home diabetes regimen: 20 units  Lantus in the afternoon, 18 R fifteen min before breakfast      Care is limited by compliance  Previous history:  Non-insulin hypoglycemic drugs previously used: Unknown Insulin was started soon after diagnosis, previously had been on Levemir, Tresiba, Lantus and NovoLog  COMPLICATIONS -  MI/Stroke -  retinopathy, last eye exam 2024 +  neuropathy +  nephropathy, ESRD on HD  BLOOD SUGAR DATA  CGM interpretation: At today's visit, we reviewed her CGM downloads. The full report is scanned in the media. Reviewing the CGM trends, BG are high at noon and later towards midnight  Physical Exam  BP 130/70   Pulse (!) 109   Ht 5\' 1"  (1.549 m)   Wt 202 lb 12.8 oz (92 kg)   BMI 38.32 kg/m    Constitutional: well developed, well nourished Head: normocephalic, atraumatic Eyes: sclera anicteric, no redness Neck: supple Lungs: normal respiratory effort Neurology: alert and oriented Skin: dry, no appreciable rashes Musculoskeletal: no appreciable defects Psychiatric: normal mood and affect Diabetic Foot Exam - Simple   No data filed      Current Medications Patient's Medications  New Prescriptions   No medications on file  Previous Medications   ACCU-CHEK FASTCLIX LANCETS MISC       ACCU-CHEK GUIDE TEST STRIP    1 each by Other route in the morning and at bedtime. E11.22   ACETAMINOPHEN (TYLENOL) 500 MG TABLET    Take 500-1,000 mg by mouth every 6 (six) hours as needed for moderate pain or headache.   ALLOPURINOL (ZYLOPRIM) 100 MG TABLET    Take 100 mg by mouth Every Tuesday,Thursday,and Saturday with dialysis. After hemodialysis   B COMPLEX-C-ZN-FOLIC ACID (DIALYVITE 800-ZINC 15) 0.8 MG TABS    Take 1 tablet by mouth daily with supper.   BLOOD GLUCOSE MONITORING SUPPL (ACCU-CHEK GUIDE) W/DEVICE KIT       CALCIUM ACETATE (PHOSLO) 667 MG CAPSULE    Take 1,334 mg by mouth 3 (three) times daily with meals.   CARVEDILOL (COREG) 25 MG TABLET    Take 25 mg by mouth in the morning and at  bedtime.    CONTINUOUS GLUCOSE RECEIVER (DEXCOM G7 RECEIVER) DEVI    Use to monitor blood sugar.   CONTINUOUS GLUCOSE SENSOR (DEXCOM G7 SENSOR) MISC    CHANGE EVERY 10 DAYS   FAMOTIDINE (PEPCID) 20 MG TABLET    Take 20 mg by mouth daily.   GUAIFENESIN (ROBITUSSIN) 100 MG/5ML SYRUP    Take 100 mg by mouth 3 (three) times daily as needed for cough.   INSULIN GLARGINE (LANTUS SOLOSTAR) 100 UNIT/ML SOLOSTAR PEN    INJECT 30 UNITS INTO THE SKIN DAILY. ADJUST AS DIRECTED   INSULIN LISPRO (HUMALOG KWIKPEN) 100 UNIT/ML KWIKPEN    18 units acbf   INSULIN PEN NEEDLE 31G X 5 MM MISC    Use for insulin   INSULIN REGULAR (NOVOLIN R) 100 UNITS/ML INJECTION    insulin regular human 100 unit/mL   LORATADINE (CLARITIN) 10 MG TABLET    Take 10 mg by mouth daily.   MECLIZINE (ANTIVERT) 25 MG TABLET    Take by mouth.   RELION INSULIN SYR 0.5ML/31G  31G X 5/16" 0.5 ML MISC    USE 4 times DAILY   ROSUVASTATIN (CRESTOR) 10 MG TABLET    Take 10 mg by mouth in the morning.   SYMBICORT 160-4.5 MCG/ACT INHALER    Inhale 2 puffs into the lungs 2 (two) times daily.  Modified Medications   No medications on file  Discontinued Medications   No medications on file    Allergies Allergies  Allergen Reactions   Penicillins Rash    Did it involve swelling of the face/tongue/throat, SOB, or low BP? No Did it involve sudden or severe rash/hives, skin peeling, or any reaction on the inside of your mouth or nose? Yes Did you need to seek medical attention at a hospital or doctor's office? N/A When did it last happen? N/A      If all above answers are "NO", may proceed with cephalosporin use.    Past Medical History Past Medical History:  Diagnosis Date   Anemia    Arthritis    COVID    hospitalized   Diabetes mellitus without complication (HCC)    ESRD    Dialysis T/Th/Sa [address]   GERD (gastroesophageal reflux disease)    Gout    Headache    Hypertension    Type 2 diabetes mellitus [facility])     Past  Surgical History Past Surgical History:  Procedure Laterality Date   A/V FISTULAGRAM Left 04/05/2022   Procedure: A/V Fistulagram;  Surgeon: Victorino Sparrow, MD;  Location: [facility] INVASIVE CV LAB;  Service: Cardiovascular;  Laterality: Left;   ABDOMINAL HYSTERECTOMY     AV FISTULA PLACEMENT Left 10/02/2016   Procedure: ARTERIOVENOUS (AV) FISTULA CREATION LEFT UPPER ARM;  Surgeon: Maeola Harman, MD;  Location: [facility] OR;  Service: Vascular;  Laterality: Left;   AV FISTULA PLACEMENT Right 07/14/2020   Procedure: RIGHT ARM ARTERIOVENOUS FISTULA CREATION;  Surgeon: Maeola Harman, MD;  Location: [facility] OR;  Service: Vascular;  Laterality: Right;   AV FISTULA PLACEMENT Left 05/16/2021   Procedure: INSERTION OF LEFT UPPER ARM ARTERIOVENOUS (AV) GORE-TEX GRAFT;  Surgeon: Cephus Shelling, MD;  Location: [facility] OR;  Service: Vascular;  Laterality: Left;   AV FISTULA PLACEMENT Right 07/13/2021   Procedure: RIGHT ARM ARTERIOVENOUS (AV) FISTULA GRAFT INSERTION;  Surgeon: Maeola Harman, MD;  Location: [facility] OR;  Service: Vascular;  Laterality: Right;   AV FISTULA PLACEMENT Left 02/03/2022   Procedure: LEFT UPPER ARM ARTERIOVENOUS FISTULA CREATION;  Surgeon: Maeola Harman, MD;  Location: [facility] OR;  Service: Vascular;  Laterality: Left;   AV FISTULA PLACEMENT Left 06/23/2022   Procedure: INSERTION OF LEFT ARM ARTERIOVENOUS (AV) GORE-TEX GRAFT;  Surgeon: Maeola Harman, MD;  Location: [facility] OR;  Service: Vascular;  Laterality: Left;   BASCILIC VEIN TRANSPOSITION Left 05/17/2020   Procedure: Insertion of Left arm arteriovenous gortex graftarm ;  Surgeon: Chuck Hint, MD;  Location: [facility] OR;  Service: Vascular;  Laterality: Left;   CESAREAN SECTION     DIALYSIS/PERMA CATHETER INSERTION     IR FLUORO GUIDE CV LINE LEFT  02/09/2022   IR PTA VENOUS EXCEPT DIALYSIS CIRCUIT  02/09/2022   IR VENOCAVAGRAM SVC  02/09/2022   REVISON OF ARTERIOVENOUS FISTULA Right 09/15/2020    Procedure: REVISON OF RIGHT UPPER ARM  ARTERIOVENOUS FISTULA;  Surgeon: Maeola Harman, MD;  Location: [facility] OR;  Service: Vascular;  Laterality: Right;   UPPER EXTREMITY VENOGRAPHY Bilateral 04/18/2021   Procedure: UPPER EXTREMITY VENOGRAPHY;  Surgeon: Maeola Harman,  MD;  Location: [facility] INVASIVE CV LAB;  Service: Cardiovascular;  Laterality: Bilateral;   UPPER EXTREMITY VENOGRAPHY Left 01/23/2022   Procedure: UPPER EXTREMITY VENOGRAPHY;  Surgeon: Maeola Harman, MD;  Location: [facility] INVASIVE CV LAB;  Service: Cardiovascular;  Laterality: Left;    Family History family history includes Diabetes in her mother.  Social History Social History   Socioeconomic History   Marital status: Married    Spouse name: Not on file   Number of children: Not on file   Years of education: Not on file   Highest education level: Not on file  Occupational History   Not on file  Tobacco Use   Smoking status: Never   Smokeless tobacco: Never  Vaping Use   Vaping Use: Never used  Substance and Sexual Activity   Alcohol use: Not Currently   Drug use: No   Sexual activity: Not on file  Other Topics Concern   Not on file  Social History Narrative   Not on file   Social Determinants of Health   Financial Resource Strain: Not on file  Food Insecurity: No Food Insecurity (07/19/2022)   Hunger Vital Sign    Worried About Running Out of Food in the Last Year: Never true    Ran Out of Food in the Last Year: Never true  Transportation Needs: No Transportation Needs (07/19/2022)   PRAPARE - Administrator, Civil Service (Medical): No    Lack of Transportation (Non-Medical): No  Physical Activity: Not on file  Stress: Not on file  Social Connections: Not on file  Intimate Partner Violence: Not on file    Lab Results  Component Value Date   HGBA1C 6.9 (H) 06/22/2023   Lab Results  Component Value Date   CHOL 197 06/22/2023   Lab Results  Component Value Date    HDL 54.80 06/22/2023   Lab Results  Component Value Date   LDLCALC 124 (H) 06/22/2023   Lab Results  Component Value Date   TRIG 91.0 06/22/2023   Lab Results  Component Value Date   CHOLHDL 4 06/22/2023   Lab Results  Component Value Date   CREATININE 6.72 (HH) 06/22/2023   Lab Results  Component Value Date   GFR 5.42 (LL) 06/22/2023   Lab Results  Component Value Date   MICROALBUR 87.6 (H) 06/22/2023      Component Value Date/Time   NA 143 06/22/2023 0844   K 4.3 06/22/2023 0844   CL 97 06/22/2023 0844   CO2 30 06/22/2023 0844   GLUCOSE 172 (H) 06/22/2023 0844   BUN 30 (H) 06/22/2023 0844   CREATININE 6.72 (HH) 06/22/2023 0844   CALCIUM 10.1 06/22/2023 0844   CALCIUM 7.3 (L) 09/17/2018 0941   PROT 7.0 06/22/2023 0844   ALBUMIN 3.8 06/22/2023 0844   AST 23 06/22/2023 0844   ALT 14 06/22/2023 0844   ALKPHOS 91 06/22/2023 0844   BILITOT 0.4 06/22/2023 0844   GFRNONAA 9 (L) 10/13/2021 1231   GFRAA 5 (L) 12/20/2019 0500      Latest Ref Rng & Units 06/22/2023    8:44 AM 02/02/2023    9:13 AM 10/18/2022   10:06 AM  BMP  Glucose 70 - 99 mg/dL 161  096  045   BUN 6 - 23 mg/dL 30     Creatinine 4.09 - 1.20 mg/dL 8.11     Sodium 914 - 782 mEq/L 143     Potassium 3.5 - 5.1 mEq/L 4.3  Chloride 96 - 112 mEq/L 97     CO2 19 - 32 mEq/L 30     Calcium 8.4 - 10.5 mg/dL 40.9          Component Value Date/Time   WBC 7.0 10/13/2021 1231   RBC 4.58 10/13/2021 1231   HGB 13.6 06/23/2022 0552   HCT 40.0 06/23/2022 0552   PLT 126 (L) 10/13/2021 1231   MCV 92.1 10/13/2021 1231   MCH 27.9 10/13/2021 1231   MCHC 30.3 10/13/2021 1231   RDW 17.0 (H) 10/13/2021 1231   LYMPHSABS 1.6 10/13/2021 1231   MONOABS 0.6 10/13/2021 1231   EOSABS 0.0 10/13/2021 1231   BASOSABS 0.1 10/13/2021 1231     Parts of this note may have been dictated using voice recognition software. There may be variances in spelling and vocabulary which are unintentional. Not all errors are  proofread. Please notify the Thereasa Parkin if any discrepancies are noted or if the meaning of any statement is not clear.

## 2023-06-27 NOTE — Patient Instructions (Signed)
20 units Lantus in the afternoon 20 Novolin R insulin 15 min before break fast

## 2023-06-28 DIAGNOSIS — D688 Other specified coagulation defects: Secondary | ICD-10-CM | POA: Diagnosis not present

## 2023-06-28 DIAGNOSIS — D509 Iron deficiency anemia, unspecified: Secondary | ICD-10-CM | POA: Diagnosis not present

## 2023-06-28 DIAGNOSIS — N186 End stage renal disease: Secondary | ICD-10-CM | POA: Diagnosis not present

## 2023-06-28 DIAGNOSIS — D631 Anemia in chronic kidney disease: Secondary | ICD-10-CM | POA: Diagnosis not present

## 2023-06-28 DIAGNOSIS — Z992 Dependence on renal dialysis: Secondary | ICD-10-CM | POA: Diagnosis not present

## 2023-06-28 DIAGNOSIS — N2581 Secondary hyperparathyroidism of renal origin: Secondary | ICD-10-CM | POA: Diagnosis not present

## 2023-06-28 DIAGNOSIS — E1129 Type 2 diabetes mellitus with other diabetic kidney complication: Secondary | ICD-10-CM | POA: Diagnosis not present

## 2023-06-28 DIAGNOSIS — R519 Headache, unspecified: Secondary | ICD-10-CM | POA: Diagnosis not present

## 2023-06-30 DIAGNOSIS — D631 Anemia in chronic kidney disease: Secondary | ICD-10-CM | POA: Diagnosis not present

## 2023-06-30 DIAGNOSIS — N186 End stage renal disease: Secondary | ICD-10-CM | POA: Diagnosis not present

## 2023-06-30 DIAGNOSIS — R519 Headache, unspecified: Secondary | ICD-10-CM | POA: Diagnosis not present

## 2023-06-30 DIAGNOSIS — D688 Other specified coagulation defects: Secondary | ICD-10-CM | POA: Diagnosis not present

## 2023-06-30 DIAGNOSIS — Z992 Dependence on renal dialysis: Secondary | ICD-10-CM | POA: Diagnosis not present

## 2023-06-30 DIAGNOSIS — D509 Iron deficiency anemia, unspecified: Secondary | ICD-10-CM | POA: Diagnosis not present

## 2023-06-30 DIAGNOSIS — E1129 Type 2 diabetes mellitus with other diabetic kidney complication: Secondary | ICD-10-CM | POA: Diagnosis not present

## 2023-06-30 DIAGNOSIS — N2581 Secondary hyperparathyroidism of renal origin: Secondary | ICD-10-CM | POA: Diagnosis not present

## 2023-07-03 DIAGNOSIS — N2581 Secondary hyperparathyroidism of renal origin: Secondary | ICD-10-CM | POA: Diagnosis not present

## 2023-07-03 DIAGNOSIS — Z992 Dependence on renal dialysis: Secondary | ICD-10-CM | POA: Diagnosis not present

## 2023-07-03 DIAGNOSIS — D688 Other specified coagulation defects: Secondary | ICD-10-CM | POA: Diagnosis not present

## 2023-07-03 DIAGNOSIS — N186 End stage renal disease: Secondary | ICD-10-CM | POA: Diagnosis not present

## 2023-07-03 DIAGNOSIS — D631 Anemia in chronic kidney disease: Secondary | ICD-10-CM | POA: Diagnosis not present

## 2023-07-03 DIAGNOSIS — E1129 Type 2 diabetes mellitus with other diabetic kidney complication: Secondary | ICD-10-CM | POA: Diagnosis not present

## 2023-07-03 DIAGNOSIS — D509 Iron deficiency anemia, unspecified: Secondary | ICD-10-CM | POA: Diagnosis not present

## 2023-07-03 DIAGNOSIS — R519 Headache, unspecified: Secondary | ICD-10-CM | POA: Diagnosis not present

## 2023-07-05 DIAGNOSIS — D688 Other specified coagulation defects: Secondary | ICD-10-CM | POA: Diagnosis not present

## 2023-07-05 DIAGNOSIS — D509 Iron deficiency anemia, unspecified: Secondary | ICD-10-CM | POA: Diagnosis not present

## 2023-07-05 DIAGNOSIS — D631 Anemia in chronic kidney disease: Secondary | ICD-10-CM | POA: Diagnosis not present

## 2023-07-05 DIAGNOSIS — N2581 Secondary hyperparathyroidism of renal origin: Secondary | ICD-10-CM | POA: Diagnosis not present

## 2023-07-05 DIAGNOSIS — R519 Headache, unspecified: Secondary | ICD-10-CM | POA: Diagnosis not present

## 2023-07-05 DIAGNOSIS — N186 End stage renal disease: Secondary | ICD-10-CM | POA: Diagnosis not present

## 2023-07-05 DIAGNOSIS — E1129 Type 2 diabetes mellitus with other diabetic kidney complication: Secondary | ICD-10-CM | POA: Diagnosis not present

## 2023-07-05 DIAGNOSIS — Z992 Dependence on renal dialysis: Secondary | ICD-10-CM | POA: Diagnosis not present

## 2023-07-07 DIAGNOSIS — D509 Iron deficiency anemia, unspecified: Secondary | ICD-10-CM | POA: Diagnosis not present

## 2023-07-07 DIAGNOSIS — N186 End stage renal disease: Secondary | ICD-10-CM | POA: Diagnosis not present

## 2023-07-07 DIAGNOSIS — D688 Other specified coagulation defects: Secondary | ICD-10-CM | POA: Diagnosis not present

## 2023-07-07 DIAGNOSIS — R519 Headache, unspecified: Secondary | ICD-10-CM | POA: Diagnosis not present

## 2023-07-07 DIAGNOSIS — N2581 Secondary hyperparathyroidism of renal origin: Secondary | ICD-10-CM | POA: Diagnosis not present

## 2023-07-07 DIAGNOSIS — E1129 Type 2 diabetes mellitus with other diabetic kidney complication: Secondary | ICD-10-CM | POA: Diagnosis not present

## 2023-07-07 DIAGNOSIS — D631 Anemia in chronic kidney disease: Secondary | ICD-10-CM | POA: Diagnosis not present

## 2023-07-07 DIAGNOSIS — Z992 Dependence on renal dialysis: Secondary | ICD-10-CM | POA: Diagnosis not present

## 2023-07-10 DIAGNOSIS — N2581 Secondary hyperparathyroidism of renal origin: Secondary | ICD-10-CM | POA: Diagnosis not present

## 2023-07-10 DIAGNOSIS — Z992 Dependence on renal dialysis: Secondary | ICD-10-CM | POA: Diagnosis not present

## 2023-07-10 DIAGNOSIS — D688 Other specified coagulation defects: Secondary | ICD-10-CM | POA: Diagnosis not present

## 2023-07-10 DIAGNOSIS — N186 End stage renal disease: Secondary | ICD-10-CM | POA: Diagnosis not present

## 2023-07-10 DIAGNOSIS — D631 Anemia in chronic kidney disease: Secondary | ICD-10-CM | POA: Diagnosis not present

## 2023-07-10 DIAGNOSIS — D509 Iron deficiency anemia, unspecified: Secondary | ICD-10-CM | POA: Diagnosis not present

## 2023-07-10 DIAGNOSIS — R519 Headache, unspecified: Secondary | ICD-10-CM | POA: Diagnosis not present

## 2023-07-10 DIAGNOSIS — E1129 Type 2 diabetes mellitus with other diabetic kidney complication: Secondary | ICD-10-CM | POA: Diagnosis not present

## 2023-07-12 DIAGNOSIS — N186 End stage renal disease: Secondary | ICD-10-CM | POA: Diagnosis not present

## 2023-07-12 DIAGNOSIS — D631 Anemia in chronic kidney disease: Secondary | ICD-10-CM | POA: Diagnosis not present

## 2023-07-12 DIAGNOSIS — D509 Iron deficiency anemia, unspecified: Secondary | ICD-10-CM | POA: Diagnosis not present

## 2023-07-12 DIAGNOSIS — R519 Headache, unspecified: Secondary | ICD-10-CM | POA: Diagnosis not present

## 2023-07-12 DIAGNOSIS — E1129 Type 2 diabetes mellitus with other diabetic kidney complication: Secondary | ICD-10-CM | POA: Diagnosis not present

## 2023-07-12 DIAGNOSIS — N2581 Secondary hyperparathyroidism of renal origin: Secondary | ICD-10-CM | POA: Diagnosis not present

## 2023-07-12 DIAGNOSIS — Z992 Dependence on renal dialysis: Secondary | ICD-10-CM | POA: Diagnosis not present

## 2023-07-12 DIAGNOSIS — D688 Other specified coagulation defects: Secondary | ICD-10-CM | POA: Diagnosis not present

## 2023-07-14 ENCOUNTER — Other Ambulatory Visit: Payer: Self-pay | Admitting: Endocrinology

## 2023-07-14 DIAGNOSIS — R519 Headache, unspecified: Secondary | ICD-10-CM | POA: Diagnosis not present

## 2023-07-14 DIAGNOSIS — Z794 Long term (current) use of insulin: Secondary | ICD-10-CM

## 2023-07-14 DIAGNOSIS — N186 End stage renal disease: Secondary | ICD-10-CM | POA: Diagnosis not present

## 2023-07-14 DIAGNOSIS — D688 Other specified coagulation defects: Secondary | ICD-10-CM | POA: Diagnosis not present

## 2023-07-14 DIAGNOSIS — D509 Iron deficiency anemia, unspecified: Secondary | ICD-10-CM | POA: Diagnosis not present

## 2023-07-14 DIAGNOSIS — E1129 Type 2 diabetes mellitus with other diabetic kidney complication: Secondary | ICD-10-CM | POA: Diagnosis not present

## 2023-07-14 DIAGNOSIS — Z992 Dependence on renal dialysis: Secondary | ICD-10-CM | POA: Diagnosis not present

## 2023-07-14 DIAGNOSIS — N2581 Secondary hyperparathyroidism of renal origin: Secondary | ICD-10-CM | POA: Diagnosis not present

## 2023-07-14 DIAGNOSIS — D631 Anemia in chronic kidney disease: Secondary | ICD-10-CM | POA: Diagnosis not present

## 2023-07-17 DIAGNOSIS — N186 End stage renal disease: Secondary | ICD-10-CM | POA: Diagnosis not present

## 2023-07-17 DIAGNOSIS — Z992 Dependence on renal dialysis: Secondary | ICD-10-CM | POA: Diagnosis not present

## 2023-07-17 DIAGNOSIS — T82868A Thrombosis of vascular prosthetic devices, implants and grafts, initial encounter: Secondary | ICD-10-CM | POA: Diagnosis not present

## 2023-07-17 DIAGNOSIS — I871 Compression of vein: Secondary | ICD-10-CM | POA: Diagnosis not present

## 2023-07-18 DIAGNOSIS — Z992 Dependence on renal dialysis: Secondary | ICD-10-CM | POA: Diagnosis not present

## 2023-07-18 DIAGNOSIS — D509 Iron deficiency anemia, unspecified: Secondary | ICD-10-CM | POA: Diagnosis not present

## 2023-07-18 DIAGNOSIS — D688 Other specified coagulation defects: Secondary | ICD-10-CM | POA: Diagnosis not present

## 2023-07-18 DIAGNOSIS — E1129 Type 2 diabetes mellitus with other diabetic kidney complication: Secondary | ICD-10-CM | POA: Diagnosis not present

## 2023-07-18 DIAGNOSIS — N2581 Secondary hyperparathyroidism of renal origin: Secondary | ICD-10-CM | POA: Diagnosis not present

## 2023-07-18 DIAGNOSIS — D631 Anemia in chronic kidney disease: Secondary | ICD-10-CM | POA: Diagnosis not present

## 2023-07-18 DIAGNOSIS — R519 Headache, unspecified: Secondary | ICD-10-CM | POA: Diagnosis not present

## 2023-07-18 DIAGNOSIS — N186 End stage renal disease: Secondary | ICD-10-CM | POA: Diagnosis not present

## 2023-07-19 DIAGNOSIS — N2581 Secondary hyperparathyroidism of renal origin: Secondary | ICD-10-CM | POA: Diagnosis not present

## 2023-07-19 DIAGNOSIS — Z992 Dependence on renal dialysis: Secondary | ICD-10-CM | POA: Diagnosis not present

## 2023-07-19 DIAGNOSIS — N186 End stage renal disease: Secondary | ICD-10-CM | POA: Diagnosis not present

## 2023-07-19 DIAGNOSIS — D688 Other specified coagulation defects: Secondary | ICD-10-CM | POA: Diagnosis not present

## 2023-07-19 DIAGNOSIS — D509 Iron deficiency anemia, unspecified: Secondary | ICD-10-CM | POA: Diagnosis not present

## 2023-07-19 DIAGNOSIS — R519 Headache, unspecified: Secondary | ICD-10-CM | POA: Diagnosis not present

## 2023-07-19 DIAGNOSIS — E1129 Type 2 diabetes mellitus with other diabetic kidney complication: Secondary | ICD-10-CM | POA: Diagnosis not present

## 2023-07-19 DIAGNOSIS — D631 Anemia in chronic kidney disease: Secondary | ICD-10-CM | POA: Diagnosis not present

## 2023-07-21 DIAGNOSIS — R519 Headache, unspecified: Secondary | ICD-10-CM | POA: Diagnosis not present

## 2023-07-21 DIAGNOSIS — N186 End stage renal disease: Secondary | ICD-10-CM | POA: Diagnosis not present

## 2023-07-21 DIAGNOSIS — D631 Anemia in chronic kidney disease: Secondary | ICD-10-CM | POA: Diagnosis not present

## 2023-07-21 DIAGNOSIS — E1129 Type 2 diabetes mellitus with other diabetic kidney complication: Secondary | ICD-10-CM | POA: Diagnosis not present

## 2023-07-21 DIAGNOSIS — N2581 Secondary hyperparathyroidism of renal origin: Secondary | ICD-10-CM | POA: Diagnosis not present

## 2023-07-21 DIAGNOSIS — D688 Other specified coagulation defects: Secondary | ICD-10-CM | POA: Diagnosis not present

## 2023-07-21 DIAGNOSIS — Z992 Dependence on renal dialysis: Secondary | ICD-10-CM | POA: Diagnosis not present

## 2023-07-21 DIAGNOSIS — D509 Iron deficiency anemia, unspecified: Secondary | ICD-10-CM | POA: Diagnosis not present

## 2023-07-24 DIAGNOSIS — D631 Anemia in chronic kidney disease: Secondary | ICD-10-CM | POA: Diagnosis not present

## 2023-07-24 DIAGNOSIS — Z992 Dependence on renal dialysis: Secondary | ICD-10-CM | POA: Diagnosis not present

## 2023-07-24 DIAGNOSIS — N186 End stage renal disease: Secondary | ICD-10-CM | POA: Diagnosis not present

## 2023-07-24 DIAGNOSIS — E1129 Type 2 diabetes mellitus with other diabetic kidney complication: Secondary | ICD-10-CM | POA: Diagnosis not present

## 2023-07-24 DIAGNOSIS — N2581 Secondary hyperparathyroidism of renal origin: Secondary | ICD-10-CM | POA: Diagnosis not present

## 2023-07-24 DIAGNOSIS — D509 Iron deficiency anemia, unspecified: Secondary | ICD-10-CM | POA: Diagnosis not present

## 2023-07-24 DIAGNOSIS — R519 Headache, unspecified: Secondary | ICD-10-CM | POA: Diagnosis not present

## 2023-07-24 DIAGNOSIS — D688 Other specified coagulation defects: Secondary | ICD-10-CM | POA: Diagnosis not present

## 2023-07-25 DIAGNOSIS — E1122 Type 2 diabetes mellitus with diabetic chronic kidney disease: Secondary | ICD-10-CM | POA: Diagnosis not present

## 2023-07-25 DIAGNOSIS — Z992 Dependence on renal dialysis: Secondary | ICD-10-CM | POA: Diagnosis not present

## 2023-07-25 DIAGNOSIS — N186 End stage renal disease: Secondary | ICD-10-CM | POA: Diagnosis not present

## 2023-07-26 DIAGNOSIS — D688 Other specified coagulation defects: Secondary | ICD-10-CM | POA: Diagnosis not present

## 2023-07-26 DIAGNOSIS — D631 Anemia in chronic kidney disease: Secondary | ICD-10-CM | POA: Diagnosis not present

## 2023-07-26 DIAGNOSIS — E1129 Type 2 diabetes mellitus with other diabetic kidney complication: Secondary | ICD-10-CM | POA: Diagnosis not present

## 2023-07-26 DIAGNOSIS — Z992 Dependence on renal dialysis: Secondary | ICD-10-CM | POA: Diagnosis not present

## 2023-07-26 DIAGNOSIS — N186 End stage renal disease: Secondary | ICD-10-CM | POA: Diagnosis not present

## 2023-07-26 DIAGNOSIS — D509 Iron deficiency anemia, unspecified: Secondary | ICD-10-CM | POA: Diagnosis not present

## 2023-07-26 DIAGNOSIS — I10 Essential (primary) hypertension: Secondary | ICD-10-CM | POA: Diagnosis not present

## 2023-07-26 DIAGNOSIS — R519 Headache, unspecified: Secondary | ICD-10-CM | POA: Diagnosis not present

## 2023-07-26 DIAGNOSIS — N2581 Secondary hyperparathyroidism of renal origin: Secondary | ICD-10-CM | POA: Diagnosis not present

## 2023-07-28 DIAGNOSIS — N2581 Secondary hyperparathyroidism of renal origin: Secondary | ICD-10-CM | POA: Diagnosis not present

## 2023-07-28 DIAGNOSIS — N186 End stage renal disease: Secondary | ICD-10-CM | POA: Diagnosis not present

## 2023-07-28 DIAGNOSIS — D688 Other specified coagulation defects: Secondary | ICD-10-CM | POA: Diagnosis not present

## 2023-07-28 DIAGNOSIS — E1129 Type 2 diabetes mellitus with other diabetic kidney complication: Secondary | ICD-10-CM | POA: Diagnosis not present

## 2023-07-28 DIAGNOSIS — D631 Anemia in chronic kidney disease: Secondary | ICD-10-CM | POA: Diagnosis not present

## 2023-07-28 DIAGNOSIS — I10 Essential (primary) hypertension: Secondary | ICD-10-CM | POA: Diagnosis not present

## 2023-07-28 DIAGNOSIS — Z992 Dependence on renal dialysis: Secondary | ICD-10-CM | POA: Diagnosis not present

## 2023-07-28 DIAGNOSIS — R519 Headache, unspecified: Secondary | ICD-10-CM | POA: Diagnosis not present

## 2023-07-28 DIAGNOSIS — D509 Iron deficiency anemia, unspecified: Secondary | ICD-10-CM | POA: Diagnosis not present

## 2023-07-31 DIAGNOSIS — D509 Iron deficiency anemia, unspecified: Secondary | ICD-10-CM | POA: Diagnosis not present

## 2023-07-31 DIAGNOSIS — Z992 Dependence on renal dialysis: Secondary | ICD-10-CM | POA: Diagnosis not present

## 2023-07-31 DIAGNOSIS — I10 Essential (primary) hypertension: Secondary | ICD-10-CM | POA: Diagnosis not present

## 2023-07-31 DIAGNOSIS — N186 End stage renal disease: Secondary | ICD-10-CM | POA: Diagnosis not present

## 2023-07-31 DIAGNOSIS — R519 Headache, unspecified: Secondary | ICD-10-CM | POA: Diagnosis not present

## 2023-07-31 DIAGNOSIS — D631 Anemia in chronic kidney disease: Secondary | ICD-10-CM | POA: Diagnosis not present

## 2023-07-31 DIAGNOSIS — E1129 Type 2 diabetes mellitus with other diabetic kidney complication: Secondary | ICD-10-CM | POA: Diagnosis not present

## 2023-07-31 DIAGNOSIS — N2581 Secondary hyperparathyroidism of renal origin: Secondary | ICD-10-CM | POA: Diagnosis not present

## 2023-07-31 DIAGNOSIS — D688 Other specified coagulation defects: Secondary | ICD-10-CM | POA: Diagnosis not present

## 2023-08-02 DIAGNOSIS — R519 Headache, unspecified: Secondary | ICD-10-CM | POA: Diagnosis not present

## 2023-08-02 DIAGNOSIS — D688 Other specified coagulation defects: Secondary | ICD-10-CM | POA: Diagnosis not present

## 2023-08-02 DIAGNOSIS — E1129 Type 2 diabetes mellitus with other diabetic kidney complication: Secondary | ICD-10-CM | POA: Diagnosis not present

## 2023-08-02 DIAGNOSIS — D631 Anemia in chronic kidney disease: Secondary | ICD-10-CM | POA: Diagnosis not present

## 2023-08-02 DIAGNOSIS — N186 End stage renal disease: Secondary | ICD-10-CM | POA: Diagnosis not present

## 2023-08-02 DIAGNOSIS — N2581 Secondary hyperparathyroidism of renal origin: Secondary | ICD-10-CM | POA: Diagnosis not present

## 2023-08-02 DIAGNOSIS — I10 Essential (primary) hypertension: Secondary | ICD-10-CM | POA: Diagnosis not present

## 2023-08-02 DIAGNOSIS — Z992 Dependence on renal dialysis: Secondary | ICD-10-CM | POA: Diagnosis not present

## 2023-08-02 DIAGNOSIS — D509 Iron deficiency anemia, unspecified: Secondary | ICD-10-CM | POA: Diagnosis not present

## 2023-08-04 DIAGNOSIS — N186 End stage renal disease: Secondary | ICD-10-CM | POA: Diagnosis not present

## 2023-08-04 DIAGNOSIS — E1129 Type 2 diabetes mellitus with other diabetic kidney complication: Secondary | ICD-10-CM | POA: Diagnosis not present

## 2023-08-04 DIAGNOSIS — Z992 Dependence on renal dialysis: Secondary | ICD-10-CM | POA: Diagnosis not present

## 2023-08-04 DIAGNOSIS — D509 Iron deficiency anemia, unspecified: Secondary | ICD-10-CM | POA: Diagnosis not present

## 2023-08-04 DIAGNOSIS — D631 Anemia in chronic kidney disease: Secondary | ICD-10-CM | POA: Diagnosis not present

## 2023-08-04 DIAGNOSIS — R519 Headache, unspecified: Secondary | ICD-10-CM | POA: Diagnosis not present

## 2023-08-04 DIAGNOSIS — I10 Essential (primary) hypertension: Secondary | ICD-10-CM | POA: Diagnosis not present

## 2023-08-04 DIAGNOSIS — D688 Other specified coagulation defects: Secondary | ICD-10-CM | POA: Diagnosis not present

## 2023-08-04 DIAGNOSIS — N2581 Secondary hyperparathyroidism of renal origin: Secondary | ICD-10-CM | POA: Diagnosis not present

## 2023-08-07 DIAGNOSIS — D509 Iron deficiency anemia, unspecified: Secondary | ICD-10-CM | POA: Diagnosis not present

## 2023-08-07 DIAGNOSIS — N2581 Secondary hyperparathyroidism of renal origin: Secondary | ICD-10-CM | POA: Diagnosis not present

## 2023-08-07 DIAGNOSIS — R519 Headache, unspecified: Secondary | ICD-10-CM | POA: Diagnosis not present

## 2023-08-07 DIAGNOSIS — D688 Other specified coagulation defects: Secondary | ICD-10-CM | POA: Diagnosis not present

## 2023-08-07 DIAGNOSIS — D631 Anemia in chronic kidney disease: Secondary | ICD-10-CM | POA: Diagnosis not present

## 2023-08-07 DIAGNOSIS — I10 Essential (primary) hypertension: Secondary | ICD-10-CM | POA: Diagnosis not present

## 2023-08-07 DIAGNOSIS — Z992 Dependence on renal dialysis: Secondary | ICD-10-CM | POA: Diagnosis not present

## 2023-08-07 DIAGNOSIS — E1129 Type 2 diabetes mellitus with other diabetic kidney complication: Secondary | ICD-10-CM | POA: Diagnosis not present

## 2023-08-07 DIAGNOSIS — N186 End stage renal disease: Secondary | ICD-10-CM | POA: Diagnosis not present

## 2023-08-09 DIAGNOSIS — E1129 Type 2 diabetes mellitus with other diabetic kidney complication: Secondary | ICD-10-CM | POA: Diagnosis not present

## 2023-08-09 DIAGNOSIS — Z992 Dependence on renal dialysis: Secondary | ICD-10-CM | POA: Diagnosis not present

## 2023-08-09 DIAGNOSIS — D688 Other specified coagulation defects: Secondary | ICD-10-CM | POA: Diagnosis not present

## 2023-08-09 DIAGNOSIS — D631 Anemia in chronic kidney disease: Secondary | ICD-10-CM | POA: Diagnosis not present

## 2023-08-09 DIAGNOSIS — R519 Headache, unspecified: Secondary | ICD-10-CM | POA: Diagnosis not present

## 2023-08-09 DIAGNOSIS — N186 End stage renal disease: Secondary | ICD-10-CM | POA: Diagnosis not present

## 2023-08-09 DIAGNOSIS — I10 Essential (primary) hypertension: Secondary | ICD-10-CM | POA: Diagnosis not present

## 2023-08-09 DIAGNOSIS — D509 Iron deficiency anemia, unspecified: Secondary | ICD-10-CM | POA: Diagnosis not present

## 2023-08-09 DIAGNOSIS — N2581 Secondary hyperparathyroidism of renal origin: Secondary | ICD-10-CM | POA: Diagnosis not present

## 2023-08-11 DIAGNOSIS — D631 Anemia in chronic kidney disease: Secondary | ICD-10-CM | POA: Diagnosis not present

## 2023-08-11 DIAGNOSIS — E1129 Type 2 diabetes mellitus with other diabetic kidney complication: Secondary | ICD-10-CM | POA: Diagnosis not present

## 2023-08-11 DIAGNOSIS — R519 Headache, unspecified: Secondary | ICD-10-CM | POA: Diagnosis not present

## 2023-08-11 DIAGNOSIS — I10 Essential (primary) hypertension: Secondary | ICD-10-CM | POA: Diagnosis not present

## 2023-08-11 DIAGNOSIS — D688 Other specified coagulation defects: Secondary | ICD-10-CM | POA: Diagnosis not present

## 2023-08-11 DIAGNOSIS — N186 End stage renal disease: Secondary | ICD-10-CM | POA: Diagnosis not present

## 2023-08-11 DIAGNOSIS — D509 Iron deficiency anemia, unspecified: Secondary | ICD-10-CM | POA: Diagnosis not present

## 2023-08-11 DIAGNOSIS — Z992 Dependence on renal dialysis: Secondary | ICD-10-CM | POA: Diagnosis not present

## 2023-08-11 DIAGNOSIS — N2581 Secondary hyperparathyroidism of renal origin: Secondary | ICD-10-CM | POA: Diagnosis not present

## 2023-08-13 ENCOUNTER — Ambulatory Visit (INDEPENDENT_AMBULATORY_CARE_PROVIDER_SITE_OTHER): Payer: Medicare Other | Admitting: Podiatry

## 2023-08-13 DIAGNOSIS — M79674 Pain in right toe(s): Secondary | ICD-10-CM | POA: Diagnosis not present

## 2023-08-13 DIAGNOSIS — B351 Tinea unguium: Secondary | ICD-10-CM

## 2023-08-13 DIAGNOSIS — M79675 Pain in left toe(s): Secondary | ICD-10-CM | POA: Diagnosis not present

## 2023-08-13 NOTE — Progress Notes (Unsigned)
       Subjective:  Patient ID: Katie Garcia [place], female    DOB: July 10, 1941,  MRN: [id number]   [name] presents to clinic today for:  Chief Complaint  Patient presents with   Nail Problem     DFC BS-155 mg/dl G4W-NU not remember PCP-Vardarajan  Her for routine diabetic nail care. No other problems today.  PCP [facility]- 3 months ago     Patient notes nails are thick, discolored, elongated and painful in shoegear when trying to ambulate.    PCP is Lorenda Ishihara, MD. date last seen was 03/14/2023  Past Medical History:  Diagnosis Date   Anemia    Arthritis    COVID    hospitalized   Diabetes mellitus without complication (HCC)    ESRD    Dialysis T/Th/Sa at [address]   GERD (gastroesophageal reflux disease)    Gout    Headache    Hypertension    Type 2 diabetes mellitus (HCC)     Allergies  Allergen Reactions   Penicillins Rash    Did it involve swelling of the face/tongue/throat, SOB, or low BP? No Did it involve sudden or severe rash/hives, skin peeling, or any reaction on the inside of your mouth or nose? Yes Did you need to seek medical attention at a hospital or doctor's office? N/A When did it last happen? N/A      If all above answers are "NO", may proceed with cephalosporin use.    Review of Systems: Negative except as noted in the HPI.  Objective:  There were no vitals filed for this visit.  Karlisha Scherber [place] is a pleasant 82 y.o. female in NAD. AAO x 3.  Vascular Examination: Capillary refill time is 3-5 seconds to toes bilateral. Palpable pedal pulses b/l LE. Digital hair present b/l.  Skin temperature gradient WNL b/l. No varicosities b/l. No cyanosis noted b/l.   Dermatological Examination: Pedal skin with normal turgor, texture and tone b/l. No open wounds. No interdigital macerations b/l. Toenails x10 are 3mm thick, discolored, dystrophic with subungual debris. There is pain with compression of the nail plates.  They are elongated  x10     Latest Ref Rng & Units 06/22/2023    8:44 AM 05/16/2023    8:27 AM 02/02/2023    9:13 AM 10/18/2022   10:06 AM  Hemoglobin A1C  Hemoglobin-A1c 4.6 - 6.5 % 6.9  6.4  7.3  9.0    Assessment/Plan: 1. Pain due to onychomycosis of toenails of both feet     The mycotic toenails were sharply debrided x10 with sterile nail nippers and a power debriding burr to decrease bulk/thickness and length.    Return in about 3 months (around 11/13/2023) for [facility].   Clerance Lav, DPM, FACFAS [facility]     [address] [place], [place] [address]                Office [phone]  Fax [phone]

## 2023-08-14 DIAGNOSIS — E1129 Type 2 diabetes mellitus with other diabetic kidney complication: Secondary | ICD-10-CM | POA: Diagnosis not present

## 2023-08-14 DIAGNOSIS — D509 Iron deficiency anemia, unspecified: Secondary | ICD-10-CM | POA: Diagnosis not present

## 2023-08-14 DIAGNOSIS — Z992 Dependence on renal dialysis: Secondary | ICD-10-CM | POA: Diagnosis not present

## 2023-08-14 DIAGNOSIS — I10 Essential (primary) hypertension: Secondary | ICD-10-CM | POA: Diagnosis not present

## 2023-08-14 DIAGNOSIS — D631 Anemia in chronic kidney disease: Secondary | ICD-10-CM | POA: Diagnosis not present

## 2023-08-14 DIAGNOSIS — R519 Headache, unspecified: Secondary | ICD-10-CM | POA: Diagnosis not present

## 2023-08-14 DIAGNOSIS — D688 Other specified coagulation defects: Secondary | ICD-10-CM | POA: Diagnosis not present

## 2023-08-14 DIAGNOSIS — N186 End stage renal disease: Secondary | ICD-10-CM | POA: Diagnosis not present

## 2023-08-14 DIAGNOSIS — N2581 Secondary hyperparathyroidism of renal origin: Secondary | ICD-10-CM | POA: Diagnosis not present

## 2023-08-16 DIAGNOSIS — Z992 Dependence on renal dialysis: Secondary | ICD-10-CM | POA: Diagnosis not present

## 2023-08-16 DIAGNOSIS — N2581 Secondary hyperparathyroidism of renal origin: Secondary | ICD-10-CM | POA: Diagnosis not present

## 2023-08-16 DIAGNOSIS — D509 Iron deficiency anemia, unspecified: Secondary | ICD-10-CM | POA: Diagnosis not present

## 2023-08-16 DIAGNOSIS — R519 Headache, unspecified: Secondary | ICD-10-CM | POA: Diagnosis not present

## 2023-08-16 DIAGNOSIS — E1129 Type 2 diabetes mellitus with other diabetic kidney complication: Secondary | ICD-10-CM | POA: Diagnosis not present

## 2023-08-16 DIAGNOSIS — N186 End stage renal disease: Secondary | ICD-10-CM | POA: Diagnosis not present

## 2023-08-16 DIAGNOSIS — I10 Essential (primary) hypertension: Secondary | ICD-10-CM | POA: Diagnosis not present

## 2023-08-16 DIAGNOSIS — D688 Other specified coagulation defects: Secondary | ICD-10-CM | POA: Diagnosis not present

## 2023-08-16 DIAGNOSIS — D631 Anemia in chronic kidney disease: Secondary | ICD-10-CM | POA: Diagnosis not present

## 2023-08-18 DIAGNOSIS — N2581 Secondary hyperparathyroidism of renal origin: Secondary | ICD-10-CM | POA: Diagnosis not present

## 2023-08-18 DIAGNOSIS — I10 Essential (primary) hypertension: Secondary | ICD-10-CM | POA: Diagnosis not present

## 2023-08-18 DIAGNOSIS — D631 Anemia in chronic kidney disease: Secondary | ICD-10-CM | POA: Diagnosis not present

## 2023-08-18 DIAGNOSIS — D688 Other specified coagulation defects: Secondary | ICD-10-CM | POA: Diagnosis not present

## 2023-08-18 DIAGNOSIS — E1129 Type 2 diabetes mellitus with other diabetic kidney complication: Secondary | ICD-10-CM | POA: Diagnosis not present

## 2023-08-18 DIAGNOSIS — N186 End stage renal disease: Secondary | ICD-10-CM | POA: Diagnosis not present

## 2023-08-18 DIAGNOSIS — Z992 Dependence on renal dialysis: Secondary | ICD-10-CM | POA: Diagnosis not present

## 2023-08-18 DIAGNOSIS — R519 Headache, unspecified: Secondary | ICD-10-CM | POA: Diagnosis not present

## 2023-08-18 DIAGNOSIS — D509 Iron deficiency anemia, unspecified: Secondary | ICD-10-CM | POA: Diagnosis not present

## 2023-08-21 DIAGNOSIS — N186 End stage renal disease: Secondary | ICD-10-CM | POA: Diagnosis not present

## 2023-08-21 DIAGNOSIS — D509 Iron deficiency anemia, unspecified: Secondary | ICD-10-CM | POA: Diagnosis not present

## 2023-08-21 DIAGNOSIS — E1129 Type 2 diabetes mellitus with other diabetic kidney complication: Secondary | ICD-10-CM | POA: Diagnosis not present

## 2023-08-21 DIAGNOSIS — D688 Other specified coagulation defects: Secondary | ICD-10-CM | POA: Diagnosis not present

## 2023-08-21 DIAGNOSIS — Z992 Dependence on renal dialysis: Secondary | ICD-10-CM | POA: Diagnosis not present

## 2023-08-21 DIAGNOSIS — D631 Anemia in chronic kidney disease: Secondary | ICD-10-CM | POA: Diagnosis not present

## 2023-08-21 DIAGNOSIS — N2581 Secondary hyperparathyroidism of renal origin: Secondary | ICD-10-CM | POA: Diagnosis not present

## 2023-08-21 DIAGNOSIS — R519 Headache, unspecified: Secondary | ICD-10-CM | POA: Diagnosis not present

## 2023-08-21 DIAGNOSIS — I10 Essential (primary) hypertension: Secondary | ICD-10-CM | POA: Diagnosis not present

## 2023-08-23 DIAGNOSIS — N186 End stage renal disease: Secondary | ICD-10-CM | POA: Diagnosis not present

## 2023-08-23 DIAGNOSIS — Z992 Dependence on renal dialysis: Secondary | ICD-10-CM | POA: Diagnosis not present

## 2023-08-23 DIAGNOSIS — E1129 Type 2 diabetes mellitus with other diabetic kidney complication: Secondary | ICD-10-CM | POA: Diagnosis not present

## 2023-08-23 DIAGNOSIS — D631 Anemia in chronic kidney disease: Secondary | ICD-10-CM | POA: Diagnosis not present

## 2023-08-23 DIAGNOSIS — I10 Essential (primary) hypertension: Secondary | ICD-10-CM | POA: Diagnosis not present

## 2023-08-23 DIAGNOSIS — N2581 Secondary hyperparathyroidism of renal origin: Secondary | ICD-10-CM | POA: Diagnosis not present

## 2023-08-23 DIAGNOSIS — D509 Iron deficiency anemia, unspecified: Secondary | ICD-10-CM | POA: Diagnosis not present

## 2023-08-23 DIAGNOSIS — R519 Headache, unspecified: Secondary | ICD-10-CM | POA: Diagnosis not present

## 2023-08-23 DIAGNOSIS — D688 Other specified coagulation defects: Secondary | ICD-10-CM | POA: Diagnosis not present

## 2023-08-25 DIAGNOSIS — Z992 Dependence on renal dialysis: Secondary | ICD-10-CM | POA: Diagnosis not present

## 2023-08-25 DIAGNOSIS — I10 Essential (primary) hypertension: Secondary | ICD-10-CM | POA: Diagnosis not present

## 2023-08-25 DIAGNOSIS — E1129 Type 2 diabetes mellitus with other diabetic kidney complication: Secondary | ICD-10-CM | POA: Diagnosis not present

## 2023-08-25 DIAGNOSIS — N186 End stage renal disease: Secondary | ICD-10-CM | POA: Diagnosis not present

## 2023-08-25 DIAGNOSIS — E1122 Type 2 diabetes mellitus with diabetic chronic kidney disease: Secondary | ICD-10-CM | POA: Diagnosis not present

## 2023-08-25 DIAGNOSIS — D509 Iron deficiency anemia, unspecified: Secondary | ICD-10-CM | POA: Diagnosis not present

## 2023-08-25 DIAGNOSIS — R519 Headache, unspecified: Secondary | ICD-10-CM | POA: Diagnosis not present

## 2023-08-25 DIAGNOSIS — D631 Anemia in chronic kidney disease: Secondary | ICD-10-CM | POA: Diagnosis not present

## 2023-08-25 DIAGNOSIS — D688 Other specified coagulation defects: Secondary | ICD-10-CM | POA: Diagnosis not present

## 2023-08-25 DIAGNOSIS — N2581 Secondary hyperparathyroidism of renal origin: Secondary | ICD-10-CM | POA: Diagnosis not present

## 2023-08-28 DIAGNOSIS — D509 Iron deficiency anemia, unspecified: Secondary | ICD-10-CM | POA: Diagnosis not present

## 2023-08-28 DIAGNOSIS — D688 Other specified coagulation defects: Secondary | ICD-10-CM | POA: Diagnosis not present

## 2023-08-28 DIAGNOSIS — E1129 Type 2 diabetes mellitus with other diabetic kidney complication: Secondary | ICD-10-CM | POA: Diagnosis not present

## 2023-08-28 DIAGNOSIS — R519 Headache, unspecified: Secondary | ICD-10-CM | POA: Diagnosis not present

## 2023-08-28 DIAGNOSIS — Z992 Dependence on renal dialysis: Secondary | ICD-10-CM | POA: Diagnosis not present

## 2023-08-28 DIAGNOSIS — N2581 Secondary hyperparathyroidism of renal origin: Secondary | ICD-10-CM | POA: Diagnosis not present

## 2023-08-28 DIAGNOSIS — N186 End stage renal disease: Secondary | ICD-10-CM | POA: Diagnosis not present

## 2023-08-28 DIAGNOSIS — D631 Anemia in chronic kidney disease: Secondary | ICD-10-CM | POA: Diagnosis not present

## 2023-08-30 DIAGNOSIS — N2581 Secondary hyperparathyroidism of renal origin: Secondary | ICD-10-CM | POA: Diagnosis not present

## 2023-08-30 DIAGNOSIS — Z992 Dependence on renal dialysis: Secondary | ICD-10-CM | POA: Diagnosis not present

## 2023-08-30 DIAGNOSIS — D631 Anemia in chronic kidney disease: Secondary | ICD-10-CM | POA: Diagnosis not present

## 2023-08-30 DIAGNOSIS — N186 End stage renal disease: Secondary | ICD-10-CM | POA: Diagnosis not present

## 2023-08-30 DIAGNOSIS — E1129 Type 2 diabetes mellitus with other diabetic kidney complication: Secondary | ICD-10-CM | POA: Diagnosis not present

## 2023-08-30 DIAGNOSIS — R519 Headache, unspecified: Secondary | ICD-10-CM | POA: Diagnosis not present

## 2023-08-30 DIAGNOSIS — D688 Other specified coagulation defects: Secondary | ICD-10-CM | POA: Diagnosis not present

## 2023-08-30 DIAGNOSIS — D509 Iron deficiency anemia, unspecified: Secondary | ICD-10-CM | POA: Diagnosis not present

## 2023-09-01 DIAGNOSIS — N2581 Secondary hyperparathyroidism of renal origin: Secondary | ICD-10-CM | POA: Diagnosis not present

## 2023-09-01 DIAGNOSIS — Z992 Dependence on renal dialysis: Secondary | ICD-10-CM | POA: Diagnosis not present

## 2023-09-01 DIAGNOSIS — R519 Headache, unspecified: Secondary | ICD-10-CM | POA: Diagnosis not present

## 2023-09-01 DIAGNOSIS — D509 Iron deficiency anemia, unspecified: Secondary | ICD-10-CM | POA: Diagnosis not present

## 2023-09-01 DIAGNOSIS — N186 End stage renal disease: Secondary | ICD-10-CM | POA: Diagnosis not present

## 2023-09-01 DIAGNOSIS — D688 Other specified coagulation defects: Secondary | ICD-10-CM | POA: Diagnosis not present

## 2023-09-01 DIAGNOSIS — E1129 Type 2 diabetes mellitus with other diabetic kidney complication: Secondary | ICD-10-CM | POA: Diagnosis not present

## 2023-09-01 DIAGNOSIS — D631 Anemia in chronic kidney disease: Secondary | ICD-10-CM | POA: Diagnosis not present

## 2023-09-04 DIAGNOSIS — Z992 Dependence on renal dialysis: Secondary | ICD-10-CM | POA: Diagnosis not present

## 2023-09-04 DIAGNOSIS — D631 Anemia in chronic kidney disease: Secondary | ICD-10-CM | POA: Diagnosis not present

## 2023-09-04 DIAGNOSIS — E1129 Type 2 diabetes mellitus with other diabetic kidney complication: Secondary | ICD-10-CM | POA: Diagnosis not present

## 2023-09-04 DIAGNOSIS — N2581 Secondary hyperparathyroidism of renal origin: Secondary | ICD-10-CM | POA: Diagnosis not present

## 2023-09-04 DIAGNOSIS — N186 End stage renal disease: Secondary | ICD-10-CM | POA: Diagnosis not present

## 2023-09-04 DIAGNOSIS — D688 Other specified coagulation defects: Secondary | ICD-10-CM | POA: Diagnosis not present

## 2023-09-04 DIAGNOSIS — D509 Iron deficiency anemia, unspecified: Secondary | ICD-10-CM | POA: Diagnosis not present

## 2023-09-04 DIAGNOSIS — R519 Headache, unspecified: Secondary | ICD-10-CM | POA: Diagnosis not present

## 2023-09-06 DIAGNOSIS — Z992 Dependence on renal dialysis: Secondary | ICD-10-CM | POA: Diagnosis not present

## 2023-09-06 DIAGNOSIS — D509 Iron deficiency anemia, unspecified: Secondary | ICD-10-CM | POA: Diagnosis not present

## 2023-09-06 DIAGNOSIS — D631 Anemia in chronic kidney disease: Secondary | ICD-10-CM | POA: Diagnosis not present

## 2023-09-06 DIAGNOSIS — R519 Headache, unspecified: Secondary | ICD-10-CM | POA: Diagnosis not present

## 2023-09-06 DIAGNOSIS — D688 Other specified coagulation defects: Secondary | ICD-10-CM | POA: Diagnosis not present

## 2023-09-06 DIAGNOSIS — N186 End stage renal disease: Secondary | ICD-10-CM | POA: Diagnosis not present

## 2023-09-06 DIAGNOSIS — E1129 Type 2 diabetes mellitus with other diabetic kidney complication: Secondary | ICD-10-CM | POA: Diagnosis not present

## 2023-09-06 DIAGNOSIS — N2581 Secondary hyperparathyroidism of renal origin: Secondary | ICD-10-CM | POA: Diagnosis not present

## 2023-09-08 DIAGNOSIS — D688 Other specified coagulation defects: Secondary | ICD-10-CM | POA: Diagnosis not present

## 2023-09-08 DIAGNOSIS — N186 End stage renal disease: Secondary | ICD-10-CM | POA: Diagnosis not present

## 2023-09-08 DIAGNOSIS — D631 Anemia in chronic kidney disease: Secondary | ICD-10-CM | POA: Diagnosis not present

## 2023-09-08 DIAGNOSIS — E1129 Type 2 diabetes mellitus with other diabetic kidney complication: Secondary | ICD-10-CM | POA: Diagnosis not present

## 2023-09-08 DIAGNOSIS — D509 Iron deficiency anemia, unspecified: Secondary | ICD-10-CM | POA: Diagnosis not present

## 2023-09-08 DIAGNOSIS — N2581 Secondary hyperparathyroidism of renal origin: Secondary | ICD-10-CM | POA: Diagnosis not present

## 2023-09-08 DIAGNOSIS — Z992 Dependence on renal dialysis: Secondary | ICD-10-CM | POA: Diagnosis not present

## 2023-09-08 DIAGNOSIS — R519 Headache, unspecified: Secondary | ICD-10-CM | POA: Diagnosis not present

## 2023-09-11 DIAGNOSIS — N186 End stage renal disease: Secondary | ICD-10-CM | POA: Diagnosis not present

## 2023-09-11 DIAGNOSIS — Z992 Dependence on renal dialysis: Secondary | ICD-10-CM | POA: Diagnosis not present

## 2023-09-11 DIAGNOSIS — D688 Other specified coagulation defects: Secondary | ICD-10-CM | POA: Diagnosis not present

## 2023-09-11 DIAGNOSIS — D509 Iron deficiency anemia, unspecified: Secondary | ICD-10-CM | POA: Diagnosis not present

## 2023-09-11 DIAGNOSIS — D631 Anemia in chronic kidney disease: Secondary | ICD-10-CM | POA: Diagnosis not present

## 2023-09-11 DIAGNOSIS — E1129 Type 2 diabetes mellitus with other diabetic kidney complication: Secondary | ICD-10-CM | POA: Diagnosis not present

## 2023-09-11 DIAGNOSIS — R519 Headache, unspecified: Secondary | ICD-10-CM | POA: Diagnosis not present

## 2023-09-11 DIAGNOSIS — N2581 Secondary hyperparathyroidism of renal origin: Secondary | ICD-10-CM | POA: Diagnosis not present

## 2023-09-13 DIAGNOSIS — E1129 Type 2 diabetes mellitus with other diabetic kidney complication: Secondary | ICD-10-CM | POA: Diagnosis not present

## 2023-09-13 DIAGNOSIS — D688 Other specified coagulation defects: Secondary | ICD-10-CM | POA: Diagnosis not present

## 2023-09-13 DIAGNOSIS — D631 Anemia in chronic kidney disease: Secondary | ICD-10-CM | POA: Diagnosis not present

## 2023-09-13 DIAGNOSIS — Z992 Dependence on renal dialysis: Secondary | ICD-10-CM | POA: Diagnosis not present

## 2023-09-13 DIAGNOSIS — D509 Iron deficiency anemia, unspecified: Secondary | ICD-10-CM | POA: Diagnosis not present

## 2023-09-13 DIAGNOSIS — N2581 Secondary hyperparathyroidism of renal origin: Secondary | ICD-10-CM | POA: Diagnosis not present

## 2023-09-13 DIAGNOSIS — R519 Headache, unspecified: Secondary | ICD-10-CM | POA: Diagnosis not present

## 2023-09-13 DIAGNOSIS — N186 End stage renal disease: Secondary | ICD-10-CM | POA: Diagnosis not present

## 2023-09-15 DIAGNOSIS — R519 Headache, unspecified: Secondary | ICD-10-CM | POA: Diagnosis not present

## 2023-09-15 DIAGNOSIS — N186 End stage renal disease: Secondary | ICD-10-CM | POA: Diagnosis not present

## 2023-09-15 DIAGNOSIS — D688 Other specified coagulation defects: Secondary | ICD-10-CM | POA: Diagnosis not present

## 2023-09-15 DIAGNOSIS — D509 Iron deficiency anemia, unspecified: Secondary | ICD-10-CM | POA: Diagnosis not present

## 2023-09-15 DIAGNOSIS — Z992 Dependence on renal dialysis: Secondary | ICD-10-CM | POA: Diagnosis not present

## 2023-09-15 DIAGNOSIS — D631 Anemia in chronic kidney disease: Secondary | ICD-10-CM | POA: Diagnosis not present

## 2023-09-15 DIAGNOSIS — N2581 Secondary hyperparathyroidism of renal origin: Secondary | ICD-10-CM | POA: Diagnosis not present

## 2023-09-15 DIAGNOSIS — E1129 Type 2 diabetes mellitus with other diabetic kidney complication: Secondary | ICD-10-CM | POA: Diagnosis not present

## 2023-09-18 DIAGNOSIS — R519 Headache, unspecified: Secondary | ICD-10-CM | POA: Diagnosis not present

## 2023-09-18 DIAGNOSIS — N2581 Secondary hyperparathyroidism of renal origin: Secondary | ICD-10-CM | POA: Diagnosis not present

## 2023-09-18 DIAGNOSIS — E1129 Type 2 diabetes mellitus with other diabetic kidney complication: Secondary | ICD-10-CM | POA: Diagnosis not present

## 2023-09-18 DIAGNOSIS — D688 Other specified coagulation defects: Secondary | ICD-10-CM | POA: Diagnosis not present

## 2023-09-18 DIAGNOSIS — N186 End stage renal disease: Secondary | ICD-10-CM | POA: Diagnosis not present

## 2023-09-18 DIAGNOSIS — D509 Iron deficiency anemia, unspecified: Secondary | ICD-10-CM | POA: Diagnosis not present

## 2023-09-18 DIAGNOSIS — D631 Anemia in chronic kidney disease: Secondary | ICD-10-CM | POA: Diagnosis not present

## 2023-09-18 DIAGNOSIS — Z992 Dependence on renal dialysis: Secondary | ICD-10-CM | POA: Diagnosis not present

## 2023-09-20 DIAGNOSIS — D631 Anemia in chronic kidney disease: Secondary | ICD-10-CM | POA: Diagnosis not present

## 2023-09-20 DIAGNOSIS — N2581 Secondary hyperparathyroidism of renal origin: Secondary | ICD-10-CM | POA: Diagnosis not present

## 2023-09-20 DIAGNOSIS — D688 Other specified coagulation defects: Secondary | ICD-10-CM | POA: Diagnosis not present

## 2023-09-20 DIAGNOSIS — D509 Iron deficiency anemia, unspecified: Secondary | ICD-10-CM | POA: Diagnosis not present

## 2023-09-20 DIAGNOSIS — R519 Headache, unspecified: Secondary | ICD-10-CM | POA: Diagnosis not present

## 2023-09-20 DIAGNOSIS — E1129 Type 2 diabetes mellitus with other diabetic kidney complication: Secondary | ICD-10-CM | POA: Diagnosis not present

## 2023-09-20 DIAGNOSIS — N186 End stage renal disease: Secondary | ICD-10-CM | POA: Diagnosis not present

## 2023-09-20 DIAGNOSIS — Z992 Dependence on renal dialysis: Secondary | ICD-10-CM | POA: Diagnosis not present

## 2023-09-22 DIAGNOSIS — R519 Headache, unspecified: Secondary | ICD-10-CM | POA: Diagnosis not present

## 2023-09-22 DIAGNOSIS — N2581 Secondary hyperparathyroidism of renal origin: Secondary | ICD-10-CM | POA: Diagnosis not present

## 2023-09-22 DIAGNOSIS — E1129 Type 2 diabetes mellitus with other diabetic kidney complication: Secondary | ICD-10-CM | POA: Diagnosis not present

## 2023-09-22 DIAGNOSIS — Z992 Dependence on renal dialysis: Secondary | ICD-10-CM | POA: Diagnosis not present

## 2023-09-22 DIAGNOSIS — N186 End stage renal disease: Secondary | ICD-10-CM | POA: Diagnosis not present

## 2023-09-22 DIAGNOSIS — D509 Iron deficiency anemia, unspecified: Secondary | ICD-10-CM | POA: Diagnosis not present

## 2023-09-22 DIAGNOSIS — D688 Other specified coagulation defects: Secondary | ICD-10-CM | POA: Diagnosis not present

## 2023-09-22 DIAGNOSIS — D631 Anemia in chronic kidney disease: Secondary | ICD-10-CM | POA: Diagnosis not present

## 2023-09-24 DIAGNOSIS — N186 End stage renal disease: Secondary | ICD-10-CM | POA: Diagnosis not present

## 2023-09-24 DIAGNOSIS — Z992 Dependence on renal dialysis: Secondary | ICD-10-CM | POA: Diagnosis not present

## 2023-09-24 DIAGNOSIS — E1122 Type 2 diabetes mellitus with diabetic chronic kidney disease: Secondary | ICD-10-CM | POA: Diagnosis not present

## 2023-09-25 DIAGNOSIS — D631 Anemia in chronic kidney disease: Secondary | ICD-10-CM | POA: Diagnosis not present

## 2023-09-25 DIAGNOSIS — Z23 Encounter for immunization: Secondary | ICD-10-CM | POA: Diagnosis not present

## 2023-09-25 DIAGNOSIS — E1129 Type 2 diabetes mellitus with other diabetic kidney complication: Secondary | ICD-10-CM | POA: Diagnosis not present

## 2023-09-25 DIAGNOSIS — N186 End stage renal disease: Secondary | ICD-10-CM | POA: Diagnosis not present

## 2023-09-25 DIAGNOSIS — N2581 Secondary hyperparathyroidism of renal origin: Secondary | ICD-10-CM | POA: Diagnosis not present

## 2023-09-25 DIAGNOSIS — D688 Other specified coagulation defects: Secondary | ICD-10-CM | POA: Diagnosis not present

## 2023-09-25 DIAGNOSIS — R519 Headache, unspecified: Secondary | ICD-10-CM | POA: Diagnosis not present

## 2023-09-25 DIAGNOSIS — Z992 Dependence on renal dialysis: Secondary | ICD-10-CM | POA: Diagnosis not present

## 2023-09-27 DIAGNOSIS — Z992 Dependence on renal dialysis: Secondary | ICD-10-CM | POA: Diagnosis not present

## 2023-09-27 DIAGNOSIS — N186 End stage renal disease: Secondary | ICD-10-CM | POA: Diagnosis not present

## 2023-09-27 DIAGNOSIS — Z23 Encounter for immunization: Secondary | ICD-10-CM | POA: Diagnosis not present

## 2023-09-27 DIAGNOSIS — D688 Other specified coagulation defects: Secondary | ICD-10-CM | POA: Diagnosis not present

## 2023-09-27 DIAGNOSIS — N2581 Secondary hyperparathyroidism of renal origin: Secondary | ICD-10-CM | POA: Diagnosis not present

## 2023-09-27 DIAGNOSIS — E1129 Type 2 diabetes mellitus with other diabetic kidney complication: Secondary | ICD-10-CM | POA: Diagnosis not present

## 2023-09-27 DIAGNOSIS — D631 Anemia in chronic kidney disease: Secondary | ICD-10-CM | POA: Diagnosis not present

## 2023-09-27 DIAGNOSIS — R519 Headache, unspecified: Secondary | ICD-10-CM | POA: Diagnosis not present

## 2023-09-29 DIAGNOSIS — N2581 Secondary hyperparathyroidism of renal origin: Secondary | ICD-10-CM | POA: Diagnosis not present

## 2023-09-29 DIAGNOSIS — D631 Anemia in chronic kidney disease: Secondary | ICD-10-CM | POA: Diagnosis not present

## 2023-09-29 DIAGNOSIS — N186 End stage renal disease: Secondary | ICD-10-CM | POA: Diagnosis not present

## 2023-09-29 DIAGNOSIS — Z992 Dependence on renal dialysis: Secondary | ICD-10-CM | POA: Diagnosis not present

## 2023-09-29 DIAGNOSIS — D688 Other specified coagulation defects: Secondary | ICD-10-CM | POA: Diagnosis not present

## 2023-09-29 DIAGNOSIS — Z23 Encounter for immunization: Secondary | ICD-10-CM | POA: Diagnosis not present

## 2023-09-29 DIAGNOSIS — R519 Headache, unspecified: Secondary | ICD-10-CM | POA: Diagnosis not present

## 2023-09-29 DIAGNOSIS — E1129 Type 2 diabetes mellitus with other diabetic kidney complication: Secondary | ICD-10-CM | POA: Diagnosis not present

## 2023-10-02 DIAGNOSIS — D631 Anemia in chronic kidney disease: Secondary | ICD-10-CM | POA: Diagnosis not present

## 2023-10-02 DIAGNOSIS — N186 End stage renal disease: Secondary | ICD-10-CM | POA: Diagnosis not present

## 2023-10-02 DIAGNOSIS — R519 Headache, unspecified: Secondary | ICD-10-CM | POA: Diagnosis not present

## 2023-10-02 DIAGNOSIS — N2581 Secondary hyperparathyroidism of renal origin: Secondary | ICD-10-CM | POA: Diagnosis not present

## 2023-10-02 DIAGNOSIS — E1129 Type 2 diabetes mellitus with other diabetic kidney complication: Secondary | ICD-10-CM | POA: Diagnosis not present

## 2023-10-02 DIAGNOSIS — D688 Other specified coagulation defects: Secondary | ICD-10-CM | POA: Diagnosis not present

## 2023-10-02 DIAGNOSIS — Z23 Encounter for immunization: Secondary | ICD-10-CM | POA: Diagnosis not present

## 2023-10-02 DIAGNOSIS — Z992 Dependence on renal dialysis: Secondary | ICD-10-CM | POA: Diagnosis not present

## 2023-10-04 DIAGNOSIS — D688 Other specified coagulation defects: Secondary | ICD-10-CM | POA: Diagnosis not present

## 2023-10-04 DIAGNOSIS — R519 Headache, unspecified: Secondary | ICD-10-CM | POA: Diagnosis not present

## 2023-10-04 DIAGNOSIS — Z23 Encounter for immunization: Secondary | ICD-10-CM | POA: Diagnosis not present

## 2023-10-04 DIAGNOSIS — Z992 Dependence on renal dialysis: Secondary | ICD-10-CM | POA: Diagnosis not present

## 2023-10-04 DIAGNOSIS — N186 End stage renal disease: Secondary | ICD-10-CM | POA: Diagnosis not present

## 2023-10-04 DIAGNOSIS — E1129 Type 2 diabetes mellitus with other diabetic kidney complication: Secondary | ICD-10-CM | POA: Diagnosis not present

## 2023-10-04 DIAGNOSIS — N2581 Secondary hyperparathyroidism of renal origin: Secondary | ICD-10-CM | POA: Diagnosis not present

## 2023-10-04 DIAGNOSIS — D631 Anemia in chronic kidney disease: Secondary | ICD-10-CM | POA: Diagnosis not present

## 2023-10-06 DIAGNOSIS — E1129 Type 2 diabetes mellitus with other diabetic kidney complication: Secondary | ICD-10-CM | POA: Diagnosis not present

## 2023-10-06 DIAGNOSIS — N2581 Secondary hyperparathyroidism of renal origin: Secondary | ICD-10-CM | POA: Diagnosis not present

## 2023-10-06 DIAGNOSIS — R519 Headache, unspecified: Secondary | ICD-10-CM | POA: Diagnosis not present

## 2023-10-06 DIAGNOSIS — D631 Anemia in chronic kidney disease: Secondary | ICD-10-CM | POA: Diagnosis not present

## 2023-10-06 DIAGNOSIS — Z992 Dependence on renal dialysis: Secondary | ICD-10-CM | POA: Diagnosis not present

## 2023-10-06 DIAGNOSIS — D688 Other specified coagulation defects: Secondary | ICD-10-CM | POA: Diagnosis not present

## 2023-10-06 DIAGNOSIS — Z23 Encounter for immunization: Secondary | ICD-10-CM | POA: Diagnosis not present

## 2023-10-06 DIAGNOSIS — N186 End stage renal disease: Secondary | ICD-10-CM | POA: Diagnosis not present

## 2023-10-09 DIAGNOSIS — N186 End stage renal disease: Secondary | ICD-10-CM | POA: Diagnosis not present

## 2023-10-09 DIAGNOSIS — Z23 Encounter for immunization: Secondary | ICD-10-CM | POA: Diagnosis not present

## 2023-10-09 DIAGNOSIS — D688 Other specified coagulation defects: Secondary | ICD-10-CM | POA: Diagnosis not present

## 2023-10-09 DIAGNOSIS — E1129 Type 2 diabetes mellitus with other diabetic kidney complication: Secondary | ICD-10-CM | POA: Diagnosis not present

## 2023-10-09 DIAGNOSIS — R519 Headache, unspecified: Secondary | ICD-10-CM | POA: Diagnosis not present

## 2023-10-09 DIAGNOSIS — Z992 Dependence on renal dialysis: Secondary | ICD-10-CM | POA: Diagnosis not present

## 2023-10-09 DIAGNOSIS — N2581 Secondary hyperparathyroidism of renal origin: Secondary | ICD-10-CM | POA: Diagnosis not present

## 2023-10-09 DIAGNOSIS — D631 Anemia in chronic kidney disease: Secondary | ICD-10-CM | POA: Diagnosis not present

## 2023-10-11 DIAGNOSIS — N2581 Secondary hyperparathyroidism of renal origin: Secondary | ICD-10-CM | POA: Diagnosis not present

## 2023-10-11 DIAGNOSIS — Z23 Encounter for immunization: Secondary | ICD-10-CM | POA: Diagnosis not present

## 2023-10-11 DIAGNOSIS — Z992 Dependence on renal dialysis: Secondary | ICD-10-CM | POA: Diagnosis not present

## 2023-10-11 DIAGNOSIS — D688 Other specified coagulation defects: Secondary | ICD-10-CM | POA: Diagnosis not present

## 2023-10-11 DIAGNOSIS — E1129 Type 2 diabetes mellitus with other diabetic kidney complication: Secondary | ICD-10-CM | POA: Diagnosis not present

## 2023-10-11 DIAGNOSIS — R519 Headache, unspecified: Secondary | ICD-10-CM | POA: Diagnosis not present

## 2023-10-11 DIAGNOSIS — D631 Anemia in chronic kidney disease: Secondary | ICD-10-CM | POA: Diagnosis not present

## 2023-10-11 DIAGNOSIS — N186 End stage renal disease: Secondary | ICD-10-CM | POA: Diagnosis not present

## 2023-10-13 DIAGNOSIS — E1129 Type 2 diabetes mellitus with other diabetic kidney complication: Secondary | ICD-10-CM | POA: Diagnosis not present

## 2023-10-13 DIAGNOSIS — Z23 Encounter for immunization: Secondary | ICD-10-CM | POA: Diagnosis not present

## 2023-10-13 DIAGNOSIS — D631 Anemia in chronic kidney disease: Secondary | ICD-10-CM | POA: Diagnosis not present

## 2023-10-13 DIAGNOSIS — Z992 Dependence on renal dialysis: Secondary | ICD-10-CM | POA: Diagnosis not present

## 2023-10-13 DIAGNOSIS — N186 End stage renal disease: Secondary | ICD-10-CM | POA: Diagnosis not present

## 2023-10-13 DIAGNOSIS — R519 Headache, unspecified: Secondary | ICD-10-CM | POA: Diagnosis not present

## 2023-10-13 DIAGNOSIS — D688 Other specified coagulation defects: Secondary | ICD-10-CM | POA: Diagnosis not present

## 2023-10-13 DIAGNOSIS — N2581 Secondary hyperparathyroidism of renal origin: Secondary | ICD-10-CM | POA: Diagnosis not present

## 2023-10-16 DIAGNOSIS — N2581 Secondary hyperparathyroidism of renal origin: Secondary | ICD-10-CM | POA: Diagnosis not present

## 2023-10-16 DIAGNOSIS — N186 End stage renal disease: Secondary | ICD-10-CM | POA: Diagnosis not present

## 2023-10-16 DIAGNOSIS — Z23 Encounter for immunization: Secondary | ICD-10-CM | POA: Diagnosis not present

## 2023-10-16 DIAGNOSIS — E1129 Type 2 diabetes mellitus with other diabetic kidney complication: Secondary | ICD-10-CM | POA: Diagnosis not present

## 2023-10-16 DIAGNOSIS — D688 Other specified coagulation defects: Secondary | ICD-10-CM | POA: Diagnosis not present

## 2023-10-16 DIAGNOSIS — R519 Headache, unspecified: Secondary | ICD-10-CM | POA: Diagnosis not present

## 2023-10-16 DIAGNOSIS — Z992 Dependence on renal dialysis: Secondary | ICD-10-CM | POA: Diagnosis not present

## 2023-10-16 DIAGNOSIS — D631 Anemia in chronic kidney disease: Secondary | ICD-10-CM | POA: Diagnosis not present

## 2023-10-18 DIAGNOSIS — D688 Other specified coagulation defects: Secondary | ICD-10-CM | POA: Diagnosis not present

## 2023-10-18 DIAGNOSIS — D631 Anemia in chronic kidney disease: Secondary | ICD-10-CM | POA: Diagnosis not present

## 2023-10-18 DIAGNOSIS — E1129 Type 2 diabetes mellitus with other diabetic kidney complication: Secondary | ICD-10-CM | POA: Diagnosis not present

## 2023-10-18 DIAGNOSIS — R519 Headache, unspecified: Secondary | ICD-10-CM | POA: Diagnosis not present

## 2023-10-18 DIAGNOSIS — Z992 Dependence on renal dialysis: Secondary | ICD-10-CM | POA: Diagnosis not present

## 2023-10-18 DIAGNOSIS — Z23 Encounter for immunization: Secondary | ICD-10-CM | POA: Diagnosis not present

## 2023-10-18 DIAGNOSIS — N2581 Secondary hyperparathyroidism of renal origin: Secondary | ICD-10-CM | POA: Diagnosis not present

## 2023-10-18 DIAGNOSIS — N186 End stage renal disease: Secondary | ICD-10-CM | POA: Diagnosis not present

## 2023-10-20 DIAGNOSIS — N2581 Secondary hyperparathyroidism of renal origin: Secondary | ICD-10-CM | POA: Diagnosis not present

## 2023-10-20 DIAGNOSIS — D688 Other specified coagulation defects: Secondary | ICD-10-CM | POA: Diagnosis not present

## 2023-10-20 DIAGNOSIS — E1129 Type 2 diabetes mellitus with other diabetic kidney complication: Secondary | ICD-10-CM | POA: Diagnosis not present

## 2023-10-20 DIAGNOSIS — R519 Headache, unspecified: Secondary | ICD-10-CM | POA: Diagnosis not present

## 2023-10-20 DIAGNOSIS — D631 Anemia in chronic kidney disease: Secondary | ICD-10-CM | POA: Diagnosis not present

## 2023-10-20 DIAGNOSIS — Z23 Encounter for immunization: Secondary | ICD-10-CM | POA: Diagnosis not present

## 2023-10-20 DIAGNOSIS — Z992 Dependence on renal dialysis: Secondary | ICD-10-CM | POA: Diagnosis not present

## 2023-10-20 DIAGNOSIS — N186 End stage renal disease: Secondary | ICD-10-CM | POA: Diagnosis not present

## 2023-10-23 DIAGNOSIS — Z23 Encounter for immunization: Secondary | ICD-10-CM | POA: Diagnosis not present

## 2023-10-23 DIAGNOSIS — Z992 Dependence on renal dialysis: Secondary | ICD-10-CM | POA: Diagnosis not present

## 2023-10-23 DIAGNOSIS — D631 Anemia in chronic kidney disease: Secondary | ICD-10-CM | POA: Diagnosis not present

## 2023-10-23 DIAGNOSIS — R519 Headache, unspecified: Secondary | ICD-10-CM | POA: Diagnosis not present

## 2023-10-23 DIAGNOSIS — N186 End stage renal disease: Secondary | ICD-10-CM | POA: Diagnosis not present

## 2023-10-23 DIAGNOSIS — E1129 Type 2 diabetes mellitus with other diabetic kidney complication: Secondary | ICD-10-CM | POA: Diagnosis not present

## 2023-10-23 DIAGNOSIS — N2581 Secondary hyperparathyroidism of renal origin: Secondary | ICD-10-CM | POA: Diagnosis not present

## 2023-10-23 DIAGNOSIS — D688 Other specified coagulation defects: Secondary | ICD-10-CM | POA: Diagnosis not present

## 2023-10-25 DIAGNOSIS — N186 End stage renal disease: Secondary | ICD-10-CM | POA: Diagnosis not present

## 2023-10-25 DIAGNOSIS — E1122 Type 2 diabetes mellitus with diabetic chronic kidney disease: Secondary | ICD-10-CM | POA: Diagnosis not present

## 2023-10-25 DIAGNOSIS — Z992 Dependence on renal dialysis: Secondary | ICD-10-CM | POA: Diagnosis not present

## 2023-10-26 DIAGNOSIS — Z992 Dependence on renal dialysis: Secondary | ICD-10-CM | POA: Diagnosis not present

## 2023-10-26 DIAGNOSIS — R519 Headache, unspecified: Secondary | ICD-10-CM | POA: Diagnosis not present

## 2023-10-26 DIAGNOSIS — E1129 Type 2 diabetes mellitus with other diabetic kidney complication: Secondary | ICD-10-CM | POA: Diagnosis not present

## 2023-10-26 DIAGNOSIS — N2581 Secondary hyperparathyroidism of renal origin: Secondary | ICD-10-CM | POA: Diagnosis not present

## 2023-10-26 DIAGNOSIS — N186 End stage renal disease: Secondary | ICD-10-CM | POA: Diagnosis not present

## 2023-10-26 DIAGNOSIS — D631 Anemia in chronic kidney disease: Secondary | ICD-10-CM | POA: Diagnosis not present

## 2023-10-26 DIAGNOSIS — D688 Other specified coagulation defects: Secondary | ICD-10-CM | POA: Diagnosis not present

## 2023-10-27 DIAGNOSIS — R519 Headache, unspecified: Secondary | ICD-10-CM | POA: Diagnosis not present

## 2023-10-27 DIAGNOSIS — D688 Other specified coagulation defects: Secondary | ICD-10-CM | POA: Diagnosis not present

## 2023-10-27 DIAGNOSIS — D631 Anemia in chronic kidney disease: Secondary | ICD-10-CM | POA: Diagnosis not present

## 2023-10-27 DIAGNOSIS — N2581 Secondary hyperparathyroidism of renal origin: Secondary | ICD-10-CM | POA: Diagnosis not present

## 2023-10-27 DIAGNOSIS — N186 End stage renal disease: Secondary | ICD-10-CM | POA: Diagnosis not present

## 2023-10-27 DIAGNOSIS — E1129 Type 2 diabetes mellitus with other diabetic kidney complication: Secondary | ICD-10-CM | POA: Diagnosis not present

## 2023-10-27 DIAGNOSIS — Z992 Dependence on renal dialysis: Secondary | ICD-10-CM | POA: Diagnosis not present

## 2023-10-29 ENCOUNTER — Encounter: Payer: Self-pay | Admitting: "Endocrinology

## 2023-10-29 ENCOUNTER — Ambulatory Visit: Payer: Medicare Other | Admitting: "Endocrinology

## 2023-10-29 VITALS — BP 130/90 | HR 87 | Ht 61.0 in | Wt 200.8 lb

## 2023-10-29 DIAGNOSIS — E7800 Pure hypercholesterolemia, unspecified: Secondary | ICD-10-CM | POA: Diagnosis not present

## 2023-10-29 DIAGNOSIS — E1165 Type 2 diabetes mellitus with hyperglycemia: Secondary | ICD-10-CM

## 2023-10-29 DIAGNOSIS — Z794 Long term (current) use of insulin: Secondary | ICD-10-CM | POA: Diagnosis not present

## 2023-10-29 LAB — POCT GLYCOSYLATED HEMOGLOBIN (HGB A1C): Hemoglobin A1C: 7.4 % — AB (ref 4.0–5.6)

## 2023-10-29 LAB — LIPID PANEL
Cholesterol: 161 mg/dL (ref 0–200)
HDL: 60.7 mg/dL (ref >39.00)
LDL Cholesterol: 80 mg/dL (ref 0–99)
NonHDL: 100.66
Total CHOL/HDL Ratio: 3
Triglycerides: 102 mg/dL (ref 0.0–149.0)
VLDL: 20.4 mg/dL (ref 0.0–40.0)

## 2023-10-29 NOTE — Patient Instructions (Signed)
Will recommend for the following change of medications to: 20 units Lantus once a day, 15 Novolin R insulin fifteen-thirty minutes before break fast and supper

## 2023-10-29 NOTE — Progress Notes (Signed)
Outpatient Endocrinology Note Altamese Dennehotso, MD  10/29/23   Katie Garcia Apr 20, 1941 [id number]  Referring Provider: Lorenda Ishihara,* Primary Care Provider: Lorenda Ishihara, MD Reason for consultation: Subjective   Assessment & Plan  Diagnoses and all orders for this visit:  Uncontrolled type 2 diabetes mellitus with hyperglycemia (HCC) -     Lipid panel; Future -     Lipid panel  Type 2 diabetes mellitus with hyperglycemia, with long-term current use of insulin (HCC) -     POCT glycosylated hemoglobin (Hb A1C)  Long-term insulin use (HCC)  Pure hypercholesterolemia     Diabetes complicated by neuropathy, ESRD on HD Hba1c goal less than 7.0, current Hba1c is 7.4 Will recommend for the following change of medications to: 20 units Lantus in the afternoon, 15 Novolin R insulin 15-30 min before break fast and supper  Cannot afford Humalog/novolog  Pt sometimes forgets to eat meal and sleeps, has 2 daughter to help remind Reinforced compliance  Has baqsimi  No known contraindications to any of above medications  Hyperlipidemia -Last LDL near goal: 124 -not taking rosuvastatin 10 mg every day, reinforced compliance  -Follow low fat diet and exercise   -Blood pressure goal <140/90 - Microalbumin/creatinine goal < 30 -not on ACE/ARB -ESRD on HD, deferred to nephrology -diet changes including salt restriction -limit eating outside -counseled BP targets per standards of diabetes care -Uncontrolled blood pressure can lead to retinopathy, nephropathy and cardiovascular and atherosclerotic heart disease  Reviewed and counseled on: -A1C target -Blood sugar targets -Complications of uncontrolled diabetes  -Checking blood sugar before meals and bedtime and bring log next visit -All medications with mechanism of action and side effects -Hypoglycemia management: rule of 15's, Glucagon Emergency Kit and medical alert ID -low-carb low-fat plate-method  diet -At least 20 minutes of physical activity per day -Annual dilated retinal eye exam and foot exam -compliance and follow up needs -follow up as scheduled or earlier if problem gets worse  Call if blood sugar is less than 70 or consistently above 250    Take a 15 gm snack of carbohydrate at bedtime before you go to sleep if your blood sugar is less than 100.    If you are going to fast after midnight for a test or procedure, ask your physician for instructions on how to reduce/decrease your insulin dose.    Call if blood sugar is less than 70 or consistently above 250  -Treating a low sugar by rule of 15  (15 gms of sugar every 15 min until sugar is more than 70) If you feel your sugar is low, test your sugar to be sure If your sugar is low (less than 70), then take 15 grams of a fast acting Carbohydrate (3-4 glucose tablets or glucose gel or 4 ounces of juice or regular soda) Recheck your sugar 15 min after treating low to make sure it is more than 70 If sugar is still less than 70, treat again with 15 grams of carbohydrate          Don't drive the hour of hypoglycemia  If unconscious/unable to eat or drink by mouth, use glucagon injection or nasal spray baqsimi and call 911. Can repeat again in 15 min if still unconscious.  Return in about 3 months (around 01/29/2024) for visit, labs today.   I have reviewed current medications, nurse's notes, allergies, vital signs, past medical and surgical history, family medical history, and social history for this encounter. Counseled patient  on symptoms, examination findings, lab findings, imaging results, treatment decisions and monitoring and prognosis. The patient understood the recommendations and agrees with the treatment plan. All questions regarding treatment plan were fully answered.  Altamese Ney, MD  10/29/23    History of Present Illness Katie Garcia is a 82 y.o. year old female who presents for follow up of Type 2 diabetes  mellitus.  Katie Garcia was first diagnosed in 1980.   Diabetes education + Has hypoglycemia awareness around 70, DexCom beeps  Home diabetes regimen: 20 units Lantus in the afternoon, 25 Novolin R insulin before break fast     Care is limited by compliance  Previous history:  Non-insulin hypoglycemic drugs previously used: Unknown Insulin was started soon after diagnosis, previously had been on Levemir, Tresiba, Lantus and NovoLog  COMPLICATIONS -  MI/Stroke -  retinopathy, last eye exam 2024 +  neuropathy +  nephropathy, ESRD on HD  BLOOD SUGAR DATA  CGM interpretation: At today's visit, we reviewed her CGM downloads. The full report is scanned in the media. Reviewing the CGM trends, BG are elevated after two meals of the day.  Physical Exam  BP (!) 130/90   Pulse 87   Ht 5\' 1"  (1.549 m)   Wt 200 lb 12.8 oz (91.1 kg)   SpO2 98%   BMI 37.94 kg/m    Constitutional: well developed, well nourished Head: normocephalic, atraumatic Eyes: sclera anicteric, no redness Neck: supple Lungs: normal respiratory effort Neurology: alert and oriented Skin: dry, no appreciable rashes Musculoskeletal: no appreciable defects Psychiatric: normal mood and affect Diabetic Foot Exam - Simple   No data filed      Current Medications Patient's Medications  New Prescriptions   No medications on file  Previous Medications   ACCU-CHEK FASTCLIX LANCETS MISC       ACCU-CHEK GUIDE TEST STRIP    1 each by Other route in the morning and at bedtime. E11.22   ACETAMINOPHEN (TYLENOL) 500 MG TABLET    Take 500-1,000 mg by mouth every 6 (six) hours as needed for moderate pain or headache.   ALLOPURINOL (ZYLOPRIM) 100 MG TABLET    Take 100 mg by mouth Every Tuesday,Thursday,and Saturday with dialysis. After hemodialysis   B COMPLEX-C-ZN-FOLIC ACID (DIALYVITE 800-ZINC 15) 0.8 MG TABS    Take 1 tablet by mouth daily with supper.   BLOOD GLUCOSE MONITORING SUPPL (ACCU-CHEK GUIDE) W/DEVICE KIT        CALCIUM ACETATE (PHOSLO) 667 MG CAPSULE    Take 1,334 mg by mouth 3 (three) times daily with meals.   CARVEDILOL (COREG) 25 MG TABLET    Take 25 mg by mouth in the morning and at bedtime.    CONTINUOUS GLUCOSE RECEIVER (DEXCOM G7 RECEIVER) DEVI    Use to monitor blood sugar.   CONTINUOUS GLUCOSE SENSOR (DEXCOM G7 SENSOR) MISC    CHANGE EVERY 10 DAYS   FAMOTIDINE (PEPCID) 20 MG TABLET    Take 20 mg by mouth daily.   GUAIFENESIN (ROBITUSSIN) 100 MG/5ML SYRUP    Take 100 mg by mouth 3 (three) times daily as needed for cough.   INSULIN GLARGINE (LANTUS SOLOSTAR) 100 UNIT/ML SOLOSTAR PEN    INJECT 30 UNITS INTO THE SKIN DAILY. ADJUST AS DIRECTED   INSULIN LISPRO (HUMALOG KWIKPEN) 100 UNIT/ML KWIKPEN    18 units acbf   INSULIN PEN NEEDLE 31G X 5 MM MISC    Use for insulin   INSULIN REGULAR (NOVOLIN R) 100 UNITS/ML INJECTION  25 Units.   LORATADINE (CLARITIN) 10 MG TABLET    Take 10 mg by mouth daily.   MECLIZINE (ANTIVERT) 25 MG TABLET    Take by mouth.   RELION INSULIN SYR 0.5ML/31G 31G X 5/16" 0.5 ML MISC    USE 4 times DAILY   ROSUVASTATIN (CRESTOR) 10 MG TABLET    Take 10 mg by mouth in the morning.   SYMBICORT 160-4.5 MCG/ACT INHALER    Inhale 2 puffs into the lungs 2 (two) times daily.  Modified Medications   No medications on file  Discontinued Medications   No medications on file    Allergies Allergies  Allergen Reactions   Penicillins Rash    Did it involve swelling of the face/tongue/throat, SOB, or low BP? No Did it involve sudden or severe rash/hives, skin peeling, or any reaction on the inside of your mouth or nose? Yes Did you need to seek medical attention at a hospital or doctor's office? N/A When did it last happen? N/A      If all above answers are "NO", may proceed with cephalosporin use.    Past Medical History Past Medical History:  Diagnosis Date   Anemia    Arthritis    COVID    hospitalized   Diabetes mellitus without complication (HCC)    ESRD     Dialysis T/Th/Sa [address]   GERD (gastroesophageal reflux disease)    Gout    Headache    Hypertension    Type 2 diabetes mellitus [facility])     Past Surgical History Past Surgical History:  Procedure Laterality Date   A/V FISTULAGRAM Left 04/05/2022   Procedure: A/V Fistulagram;  Surgeon: Victorino Sparrow, MD;  Location: [facility] INVASIVE CV LAB;  Service: Cardiovascular;  Laterality: Left;   ABDOMINAL HYSTERECTOMY     AV FISTULA PLACEMENT Left 10/02/2016   Procedure: ARTERIOVENOUS (AV) FISTULA CREATION LEFT UPPER ARM;  Surgeon: Maeola Harman, MD;  Location: [facility] OR;  Service: Vascular;  Laterality: Left;   AV FISTULA PLACEMENT Right 07/14/2020   Procedure: RIGHT ARM ARTERIOVENOUS FISTULA CREATION;  Surgeon: Maeola Harman, MD;  Location: [facility] OR;  Service: Vascular;  Laterality: Right;   AV FISTULA PLACEMENT Left 05/16/2021   Procedure: INSERTION OF LEFT UPPER ARM ARTERIOVENOUS (AV) GORE-TEX GRAFT;  Surgeon: Cephus Shelling, MD;  Location: [facility] OR;  Service: Vascular;  Laterality: Left;   AV FISTULA PLACEMENT Right 07/13/2021   Procedure: RIGHT ARM ARTERIOVENOUS (AV) FISTULA GRAFT INSERTION;  Surgeon: Maeola Harman, MD;  Location: [facility] OR;  Service: Vascular;  Laterality: Right;   AV FISTULA PLACEMENT Left 02/03/2022   Procedure: LEFT UPPER ARM ARTERIOVENOUS FISTULA CREATION;  Surgeon: Maeola Harman, MD;  Location: [facility] OR;  Service: Vascular;  Laterality: Left;   AV FISTULA PLACEMENT Left 06/23/2022   Procedure: INSERTION OF LEFT ARM ARTERIOVENOUS (AV) GORE-TEX GRAFT;  Surgeon: Maeola Harman, MD;  Location: [facility] OR;  Service: Vascular;  Laterality: Left;   BASCILIC VEIN TRANSPOSITION Left 05/17/2020   Procedure: Insertion of Left arm arteriovenous gortex graftarm ;  Surgeon: Chuck Hint, MD;  Location: [facility] OR;  Service: Vascular;  Laterality: Left;   CESAREAN SECTION     DIALYSIS/PERMA CATHETER INSERTION     IR FLUORO GUIDE CV LINE LEFT   02/09/2022   IR PTA VENOUS EXCEPT DIALYSIS CIRCUIT  02/09/2022   IR VENOCAVAGRAM SVC  02/09/2022   REVISON OF ARTERIOVENOUS FISTULA Right 09/15/2020   Procedure: REVISON OF RIGHT UPPER ARM  ARTERIOVENOUS FISTULA;  Surgeon: Maeola Harman, MD;  Location: [facility] OR;  Service: Vascular;  Laterality: Right;   UPPER EXTREMITY VENOGRAPHY Bilateral 04/18/2021   Procedure: UPPER EXTREMITY VENOGRAPHY;  Surgeon: Maeola Harman, MD;  Location: [facility] INVASIVE CV LAB;  Service: Cardiovascular;  Laterality: Bilateral;   UPPER EXTREMITY VENOGRAPHY Left 01/23/2022   Procedure: UPPER EXTREMITY VENOGRAPHY;  Surgeon: Maeola Harman, MD;  Location: [facility] INVASIVE CV LAB;  Service: Cardiovascular;  Laterality: Left;    Family History family history includes Diabetes in her mother.  Social History Social History   Socioeconomic History   Marital status: Married    Spouse name: Not on file   Number of children: Not on file   Years of education: Not on file   Highest education level: Not on file  Occupational History   Not on file  Tobacco Use   Smoking status: Never   Smokeless tobacco: Never  Vaping Use   Vaping status: Never Used  Substance and Sexual Activity   Alcohol use: Not Currently   Drug use: No   Sexual activity: Not on file  Other Topics Concern   Not on file  Social History Narrative   Not on file   Social Determinants of Health   Financial Resource Strain: Not on file  Food Insecurity: No Food Insecurity (07/19/2022)   Hunger Vital Sign    Worried About Running Out of Food in the Last Year: Never true    Ran Out of Food in the Last Year: Never true  Transportation Needs: No Transportation Needs (07/19/2022)   PRAPARE - Administrator, Civil Service (Medical): No    Lack of Transportation (Non-Medical): No  Physical Activity: Not on file  Stress: Not on file  Social Connections: Not on file  Intimate Partner Violence: Not on file    Lab Results   Component Value Date   HGBA1C 7.4 (A) 10/29/2023   Lab Results  Component Value Date   CHOL 197 06/22/2023   Lab Results  Component Value Date   HDL 54.80 06/22/2023   Lab Results  Component Value Date   LDLCALC 124 (H) 06/22/2023   Lab Results  Component Value Date   TRIG 91.0 06/22/2023   Lab Results  Component Value Date   CHOLHDL 4 06/22/2023   Lab Results  Component Value Date   CREATININE 6.72 (HH) 06/22/2023   Lab Results  Component Value Date   GFR 5.42 (LL) 06/22/2023   Lab Results  Component Value Date   MICROALBUR 87.6 (H) 06/22/2023      Component Value Date/Time   NA 143 06/22/2023 0844   K 4.3 06/22/2023 0844   CL 97 06/22/2023 0844   CO2 30 06/22/2023 0844   GLUCOSE 172 (H) 06/22/2023 0844   BUN 30 (H) 06/22/2023 0844   CREATININE 6.72 (HH) 06/22/2023 0844   CALCIUM 10.1 06/22/2023 0844   CALCIUM 7.3 (L) 09/17/2018 0941   PROT 7.0 06/22/2023 0844   ALBUMIN 3.8 06/22/2023 0844   AST 23 06/22/2023 0844   ALT 14 06/22/2023 0844   ALKPHOS 91 06/22/2023 0844   BILITOT 0.4 06/22/2023 0844   GFRNONAA 9 (L) 10/13/2021 1231   GFRAA 5 (L) 12/20/2019 0500      Latest Ref Rng & Units 06/22/2023    8:44 AM 02/02/2023    9:13 AM 10/18/2022   10:06 AM  BMP  Glucose 70 - 99 mg/dL 956  213  086   BUN 6 -  23 mg/dL 30     Creatinine 1.61 - 1.20 mg/dL 0.96     Sodium 045 - 409 mEq/L 143     Potassium 3.5 - 5.1 mEq/L 4.3     Chloride 96 - 112 mEq/L 97     CO2 19 - 32 mEq/L 30     Calcium 8.4 - 10.5 mg/dL 81.1          Component Value Date/Time   WBC 7.0 10/13/2021 1231   RBC 4.58 10/13/2021 1231   HGB 13.6 06/23/2022 0552   HCT 40.0 06/23/2022 0552   PLT 126 (L) 10/13/2021 1231   MCV 92.1 10/13/2021 1231   MCH 27.9 10/13/2021 1231   MCHC 30.3 10/13/2021 1231   RDW 17.0 (H) 10/13/2021 1231   LYMPHSABS 1.6 10/13/2021 1231   MONOABS 0.6 10/13/2021 1231   EOSABS 0.0 10/13/2021 1231   BASOSABS 0.1 10/13/2021 1231     Parts of this note may  have been dictated using voice recognition software. There may be variances in spelling and vocabulary which are unintentional. Not all errors are proofread. Please notify the Thereasa Parkin if any discrepancies are noted or if the meaning of any statement is not clear.

## 2023-10-30 DIAGNOSIS — N2581 Secondary hyperparathyroidism of renal origin: Secondary | ICD-10-CM | POA: Diagnosis not present

## 2023-10-30 DIAGNOSIS — Z992 Dependence on renal dialysis: Secondary | ICD-10-CM | POA: Diagnosis not present

## 2023-10-30 DIAGNOSIS — R519 Headache, unspecified: Secondary | ICD-10-CM | POA: Diagnosis not present

## 2023-10-30 DIAGNOSIS — E1129 Type 2 diabetes mellitus with other diabetic kidney complication: Secondary | ICD-10-CM | POA: Diagnosis not present

## 2023-10-30 DIAGNOSIS — D631 Anemia in chronic kidney disease: Secondary | ICD-10-CM | POA: Diagnosis not present

## 2023-10-30 DIAGNOSIS — D688 Other specified coagulation defects: Secondary | ICD-10-CM | POA: Diagnosis not present

## 2023-10-30 DIAGNOSIS — N186 End stage renal disease: Secondary | ICD-10-CM | POA: Diagnosis not present

## 2023-11-01 DIAGNOSIS — Z992 Dependence on renal dialysis: Secondary | ICD-10-CM | POA: Diagnosis not present

## 2023-11-01 DIAGNOSIS — E1129 Type 2 diabetes mellitus with other diabetic kidney complication: Secondary | ICD-10-CM | POA: Diagnosis not present

## 2023-11-01 DIAGNOSIS — D631 Anemia in chronic kidney disease: Secondary | ICD-10-CM | POA: Diagnosis not present

## 2023-11-01 DIAGNOSIS — D688 Other specified coagulation defects: Secondary | ICD-10-CM | POA: Diagnosis not present

## 2023-11-01 DIAGNOSIS — N2581 Secondary hyperparathyroidism of renal origin: Secondary | ICD-10-CM | POA: Diagnosis not present

## 2023-11-01 DIAGNOSIS — R519 Headache, unspecified: Secondary | ICD-10-CM | POA: Diagnosis not present

## 2023-11-01 DIAGNOSIS — N186 End stage renal disease: Secondary | ICD-10-CM | POA: Diagnosis not present

## 2023-11-03 DIAGNOSIS — N2581 Secondary hyperparathyroidism of renal origin: Secondary | ICD-10-CM | POA: Diagnosis not present

## 2023-11-03 DIAGNOSIS — Z992 Dependence on renal dialysis: Secondary | ICD-10-CM | POA: Diagnosis not present

## 2023-11-03 DIAGNOSIS — D688 Other specified coagulation defects: Secondary | ICD-10-CM | POA: Diagnosis not present

## 2023-11-03 DIAGNOSIS — D631 Anemia in chronic kidney disease: Secondary | ICD-10-CM | POA: Diagnosis not present

## 2023-11-03 DIAGNOSIS — E1129 Type 2 diabetes mellitus with other diabetic kidney complication: Secondary | ICD-10-CM | POA: Diagnosis not present

## 2023-11-03 DIAGNOSIS — N186 End stage renal disease: Secondary | ICD-10-CM | POA: Diagnosis not present

## 2023-11-03 DIAGNOSIS — R519 Headache, unspecified: Secondary | ICD-10-CM | POA: Diagnosis not present

## 2023-11-06 DIAGNOSIS — Z992 Dependence on renal dialysis: Secondary | ICD-10-CM | POA: Diagnosis not present

## 2023-11-06 DIAGNOSIS — E1129 Type 2 diabetes mellitus with other diabetic kidney complication: Secondary | ICD-10-CM | POA: Diagnosis not present

## 2023-11-06 DIAGNOSIS — N186 End stage renal disease: Secondary | ICD-10-CM | POA: Diagnosis not present

## 2023-11-06 DIAGNOSIS — N2581 Secondary hyperparathyroidism of renal origin: Secondary | ICD-10-CM | POA: Diagnosis not present

## 2023-11-06 DIAGNOSIS — R519 Headache, unspecified: Secondary | ICD-10-CM | POA: Diagnosis not present

## 2023-11-06 DIAGNOSIS — D631 Anemia in chronic kidney disease: Secondary | ICD-10-CM | POA: Diagnosis not present

## 2023-11-06 DIAGNOSIS — D688 Other specified coagulation defects: Secondary | ICD-10-CM | POA: Diagnosis not present

## 2023-11-08 DIAGNOSIS — Z992 Dependence on renal dialysis: Secondary | ICD-10-CM | POA: Diagnosis not present

## 2023-11-08 DIAGNOSIS — N2581 Secondary hyperparathyroidism of renal origin: Secondary | ICD-10-CM | POA: Diagnosis not present

## 2023-11-08 DIAGNOSIS — N186 End stage renal disease: Secondary | ICD-10-CM | POA: Diagnosis not present

## 2023-11-08 DIAGNOSIS — E1129 Type 2 diabetes mellitus with other diabetic kidney complication: Secondary | ICD-10-CM | POA: Diagnosis not present

## 2023-11-08 DIAGNOSIS — R519 Headache, unspecified: Secondary | ICD-10-CM | POA: Diagnosis not present

## 2023-11-08 DIAGNOSIS — D631 Anemia in chronic kidney disease: Secondary | ICD-10-CM | POA: Diagnosis not present

## 2023-11-08 DIAGNOSIS — D688 Other specified coagulation defects: Secondary | ICD-10-CM | POA: Diagnosis not present

## 2023-11-09 DIAGNOSIS — E785 Hyperlipidemia, unspecified: Secondary | ICD-10-CM | POA: Diagnosis not present

## 2023-11-09 DIAGNOSIS — E113291 Type 2 diabetes mellitus with mild nonproliferative diabetic retinopathy without macular edema, right eye: Secondary | ICD-10-CM | POA: Diagnosis not present

## 2023-11-09 DIAGNOSIS — N186 End stage renal disease: Secondary | ICD-10-CM | POA: Diagnosis not present

## 2023-11-09 DIAGNOSIS — Z0000 Encounter for general adult medical examination without abnormal findings: Secondary | ICD-10-CM | POA: Diagnosis not present

## 2023-11-09 DIAGNOSIS — N2581 Secondary hyperparathyroidism of renal origin: Secondary | ICD-10-CM | POA: Diagnosis not present

## 2023-11-09 DIAGNOSIS — M109 Gout, unspecified: Secondary | ICD-10-CM | POA: Diagnosis not present

## 2023-11-09 DIAGNOSIS — E1122 Type 2 diabetes mellitus with diabetic chronic kidney disease: Secondary | ICD-10-CM | POA: Diagnosis not present

## 2023-11-09 DIAGNOSIS — E113212 Type 2 diabetes mellitus with mild nonproliferative diabetic retinopathy with macular edema, left eye: Secondary | ICD-10-CM | POA: Diagnosis not present

## 2023-11-09 DIAGNOSIS — Z992 Dependence on renal dialysis: Secondary | ICD-10-CM | POA: Diagnosis not present

## 2023-11-09 DIAGNOSIS — I129 Hypertensive chronic kidney disease with stage 1 through stage 4 chronic kidney disease, or unspecified chronic kidney disease: Secondary | ICD-10-CM | POA: Diagnosis not present

## 2023-11-10 DIAGNOSIS — D688 Other specified coagulation defects: Secondary | ICD-10-CM | POA: Diagnosis not present

## 2023-11-10 DIAGNOSIS — Z992 Dependence on renal dialysis: Secondary | ICD-10-CM | POA: Diagnosis not present

## 2023-11-10 DIAGNOSIS — D631 Anemia in chronic kidney disease: Secondary | ICD-10-CM | POA: Diagnosis not present

## 2023-11-10 DIAGNOSIS — R519 Headache, unspecified: Secondary | ICD-10-CM | POA: Diagnosis not present

## 2023-11-10 DIAGNOSIS — N2581 Secondary hyperparathyroidism of renal origin: Secondary | ICD-10-CM | POA: Diagnosis not present

## 2023-11-10 DIAGNOSIS — E1129 Type 2 diabetes mellitus with other diabetic kidney complication: Secondary | ICD-10-CM | POA: Diagnosis not present

## 2023-11-10 DIAGNOSIS — N186 End stage renal disease: Secondary | ICD-10-CM | POA: Diagnosis not present

## 2023-11-13 DIAGNOSIS — N2581 Secondary hyperparathyroidism of renal origin: Secondary | ICD-10-CM | POA: Diagnosis not present

## 2023-11-13 DIAGNOSIS — D688 Other specified coagulation defects: Secondary | ICD-10-CM | POA: Diagnosis not present

## 2023-11-13 DIAGNOSIS — R519 Headache, unspecified: Secondary | ICD-10-CM | POA: Diagnosis not present

## 2023-11-13 DIAGNOSIS — E1122 Type 2 diabetes mellitus with diabetic chronic kidney disease: Secondary | ICD-10-CM | POA: Diagnosis not present

## 2023-11-13 DIAGNOSIS — N186 End stage renal disease: Secondary | ICD-10-CM | POA: Diagnosis not present

## 2023-11-13 DIAGNOSIS — Z992 Dependence on renal dialysis: Secondary | ICD-10-CM | POA: Diagnosis not present

## 2023-11-13 DIAGNOSIS — E1129 Type 2 diabetes mellitus with other diabetic kidney complication: Secondary | ICD-10-CM | POA: Diagnosis not present

## 2023-11-13 DIAGNOSIS — D631 Anemia in chronic kidney disease: Secondary | ICD-10-CM | POA: Diagnosis not present

## 2023-11-15 DIAGNOSIS — D631 Anemia in chronic kidney disease: Secondary | ICD-10-CM | POA: Diagnosis not present

## 2023-11-15 DIAGNOSIS — R519 Headache, unspecified: Secondary | ICD-10-CM | POA: Diagnosis not present

## 2023-11-15 DIAGNOSIS — N2581 Secondary hyperparathyroidism of renal origin: Secondary | ICD-10-CM | POA: Diagnosis not present

## 2023-11-15 DIAGNOSIS — E1129 Type 2 diabetes mellitus with other diabetic kidney complication: Secondary | ICD-10-CM | POA: Diagnosis not present

## 2023-11-15 DIAGNOSIS — Z992 Dependence on renal dialysis: Secondary | ICD-10-CM | POA: Diagnosis not present

## 2023-11-15 DIAGNOSIS — D688 Other specified coagulation defects: Secondary | ICD-10-CM | POA: Diagnosis not present

## 2023-11-15 DIAGNOSIS — N186 End stage renal disease: Secondary | ICD-10-CM | POA: Diagnosis not present

## 2023-11-17 DIAGNOSIS — D688 Other specified coagulation defects: Secondary | ICD-10-CM | POA: Diagnosis not present

## 2023-11-17 DIAGNOSIS — N2581 Secondary hyperparathyroidism of renal origin: Secondary | ICD-10-CM | POA: Diagnosis not present

## 2023-11-17 DIAGNOSIS — Z992 Dependence on renal dialysis: Secondary | ICD-10-CM | POA: Diagnosis not present

## 2023-11-17 DIAGNOSIS — N186 End stage renal disease: Secondary | ICD-10-CM | POA: Diagnosis not present

## 2023-11-17 DIAGNOSIS — R519 Headache, unspecified: Secondary | ICD-10-CM | POA: Diagnosis not present

## 2023-11-17 DIAGNOSIS — E1129 Type 2 diabetes mellitus with other diabetic kidney complication: Secondary | ICD-10-CM | POA: Diagnosis not present

## 2023-11-17 DIAGNOSIS — D631 Anemia in chronic kidney disease: Secondary | ICD-10-CM | POA: Diagnosis not present

## 2023-11-19 DIAGNOSIS — R519 Headache, unspecified: Secondary | ICD-10-CM | POA: Diagnosis not present

## 2023-11-19 DIAGNOSIS — E1129 Type 2 diabetes mellitus with other diabetic kidney complication: Secondary | ICD-10-CM | POA: Diagnosis not present

## 2023-11-19 DIAGNOSIS — N2581 Secondary hyperparathyroidism of renal origin: Secondary | ICD-10-CM | POA: Diagnosis not present

## 2023-11-19 DIAGNOSIS — Z992 Dependence on renal dialysis: Secondary | ICD-10-CM | POA: Diagnosis not present

## 2023-11-19 DIAGNOSIS — D631 Anemia in chronic kidney disease: Secondary | ICD-10-CM | POA: Diagnosis not present

## 2023-11-19 DIAGNOSIS — N186 End stage renal disease: Secondary | ICD-10-CM | POA: Diagnosis not present

## 2023-11-19 DIAGNOSIS — D688 Other specified coagulation defects: Secondary | ICD-10-CM | POA: Diagnosis not present

## 2023-11-20 ENCOUNTER — Ambulatory Visit: Payer: Medicare Other | Admitting: Podiatry

## 2023-11-21 ENCOUNTER — Ambulatory Visit: Payer: Medicare Other | Admitting: Podiatry

## 2023-11-21 DIAGNOSIS — E1129 Type 2 diabetes mellitus with other diabetic kidney complication: Secondary | ICD-10-CM | POA: Diagnosis not present

## 2023-11-21 DIAGNOSIS — D688 Other specified coagulation defects: Secondary | ICD-10-CM | POA: Diagnosis not present

## 2023-11-21 DIAGNOSIS — Z992 Dependence on renal dialysis: Secondary | ICD-10-CM | POA: Diagnosis not present

## 2023-11-21 DIAGNOSIS — N2581 Secondary hyperparathyroidism of renal origin: Secondary | ICD-10-CM | POA: Diagnosis not present

## 2023-11-21 DIAGNOSIS — N186 End stage renal disease: Secondary | ICD-10-CM | POA: Diagnosis not present

## 2023-11-21 DIAGNOSIS — D631 Anemia in chronic kidney disease: Secondary | ICD-10-CM | POA: Diagnosis not present

## 2023-11-21 DIAGNOSIS — R519 Headache, unspecified: Secondary | ICD-10-CM | POA: Diagnosis not present

## 2023-11-24 DIAGNOSIS — N2581 Secondary hyperparathyroidism of renal origin: Secondary | ICD-10-CM | POA: Diagnosis not present

## 2023-11-24 DIAGNOSIS — N186 End stage renal disease: Secondary | ICD-10-CM | POA: Diagnosis not present

## 2023-11-24 DIAGNOSIS — E1129 Type 2 diabetes mellitus with other diabetic kidney complication: Secondary | ICD-10-CM | POA: Diagnosis not present

## 2023-11-24 DIAGNOSIS — R519 Headache, unspecified: Secondary | ICD-10-CM | POA: Diagnosis not present

## 2023-11-24 DIAGNOSIS — D631 Anemia in chronic kidney disease: Secondary | ICD-10-CM | POA: Diagnosis not present

## 2023-11-24 DIAGNOSIS — E1122 Type 2 diabetes mellitus with diabetic chronic kidney disease: Secondary | ICD-10-CM | POA: Diagnosis not present

## 2023-11-24 DIAGNOSIS — D688 Other specified coagulation defects: Secondary | ICD-10-CM | POA: Diagnosis not present

## 2023-11-24 DIAGNOSIS — Z992 Dependence on renal dialysis: Secondary | ICD-10-CM | POA: Diagnosis not present

## 2023-11-27 DIAGNOSIS — N186 End stage renal disease: Secondary | ICD-10-CM | POA: Diagnosis not present

## 2023-11-27 DIAGNOSIS — R519 Headache, unspecified: Secondary | ICD-10-CM | POA: Diagnosis not present

## 2023-11-27 DIAGNOSIS — N2581 Secondary hyperparathyroidism of renal origin: Secondary | ICD-10-CM | POA: Diagnosis not present

## 2023-11-27 DIAGNOSIS — D688 Other specified coagulation defects: Secondary | ICD-10-CM | POA: Diagnosis not present

## 2023-11-27 DIAGNOSIS — Z992 Dependence on renal dialysis: Secondary | ICD-10-CM | POA: Diagnosis not present

## 2023-11-27 DIAGNOSIS — D631 Anemia in chronic kidney disease: Secondary | ICD-10-CM | POA: Diagnosis not present

## 2023-11-27 DIAGNOSIS — D509 Iron deficiency anemia, unspecified: Secondary | ICD-10-CM | POA: Diagnosis not present

## 2023-11-28 ENCOUNTER — Other Ambulatory Visit: Payer: Self-pay

## 2023-11-28 DIAGNOSIS — E1151 Type 2 diabetes mellitus with diabetic peripheral angiopathy without gangrene: Secondary | ICD-10-CM

## 2023-11-28 MED ORDER — DEXCOM G7 SENSOR MISC
2 refills | Status: DC
Start: 2023-11-28 — End: 2024-03-10

## 2023-11-29 DIAGNOSIS — D509 Iron deficiency anemia, unspecified: Secondary | ICD-10-CM | POA: Diagnosis not present

## 2023-11-29 DIAGNOSIS — D688 Other specified coagulation defects: Secondary | ICD-10-CM | POA: Diagnosis not present

## 2023-11-29 DIAGNOSIS — Z992 Dependence on renal dialysis: Secondary | ICD-10-CM | POA: Diagnosis not present

## 2023-11-29 DIAGNOSIS — R519 Headache, unspecified: Secondary | ICD-10-CM | POA: Diagnosis not present

## 2023-11-29 DIAGNOSIS — D631 Anemia in chronic kidney disease: Secondary | ICD-10-CM | POA: Diagnosis not present

## 2023-11-29 DIAGNOSIS — N186 End stage renal disease: Secondary | ICD-10-CM | POA: Diagnosis not present

## 2023-11-29 DIAGNOSIS — N2581 Secondary hyperparathyroidism of renal origin: Secondary | ICD-10-CM | POA: Diagnosis not present

## 2023-12-01 DIAGNOSIS — D509 Iron deficiency anemia, unspecified: Secondary | ICD-10-CM | POA: Diagnosis not present

## 2023-12-01 DIAGNOSIS — R519 Headache, unspecified: Secondary | ICD-10-CM | POA: Diagnosis not present

## 2023-12-01 DIAGNOSIS — N186 End stage renal disease: Secondary | ICD-10-CM | POA: Diagnosis not present

## 2023-12-01 DIAGNOSIS — D631 Anemia in chronic kidney disease: Secondary | ICD-10-CM | POA: Diagnosis not present

## 2023-12-01 DIAGNOSIS — N2581 Secondary hyperparathyroidism of renal origin: Secondary | ICD-10-CM | POA: Diagnosis not present

## 2023-12-01 DIAGNOSIS — Z992 Dependence on renal dialysis: Secondary | ICD-10-CM | POA: Diagnosis not present

## 2023-12-01 DIAGNOSIS — D688 Other specified coagulation defects: Secondary | ICD-10-CM | POA: Diagnosis not present

## 2023-12-03 ENCOUNTER — Ambulatory Visit: Payer: Medicare Other | Admitting: Podiatry

## 2023-12-04 DIAGNOSIS — R519 Headache, unspecified: Secondary | ICD-10-CM | POA: Diagnosis not present

## 2023-12-04 DIAGNOSIS — N2581 Secondary hyperparathyroidism of renal origin: Secondary | ICD-10-CM | POA: Diagnosis not present

## 2023-12-04 DIAGNOSIS — N186 End stage renal disease: Secondary | ICD-10-CM | POA: Diagnosis not present

## 2023-12-04 DIAGNOSIS — D688 Other specified coagulation defects: Secondary | ICD-10-CM | POA: Diagnosis not present

## 2023-12-04 DIAGNOSIS — D631 Anemia in chronic kidney disease: Secondary | ICD-10-CM | POA: Diagnosis not present

## 2023-12-04 DIAGNOSIS — Z992 Dependence on renal dialysis: Secondary | ICD-10-CM | POA: Diagnosis not present

## 2023-12-04 DIAGNOSIS — D509 Iron deficiency anemia, unspecified: Secondary | ICD-10-CM | POA: Diagnosis not present

## 2023-12-06 DIAGNOSIS — Z992 Dependence on renal dialysis: Secondary | ICD-10-CM | POA: Diagnosis not present

## 2023-12-06 DIAGNOSIS — R519 Headache, unspecified: Secondary | ICD-10-CM | POA: Diagnosis not present

## 2023-12-06 DIAGNOSIS — N186 End stage renal disease: Secondary | ICD-10-CM | POA: Diagnosis not present

## 2023-12-06 DIAGNOSIS — D688 Other specified coagulation defects: Secondary | ICD-10-CM | POA: Diagnosis not present

## 2023-12-06 DIAGNOSIS — D509 Iron deficiency anemia, unspecified: Secondary | ICD-10-CM | POA: Diagnosis not present

## 2023-12-06 DIAGNOSIS — D631 Anemia in chronic kidney disease: Secondary | ICD-10-CM | POA: Diagnosis not present

## 2023-12-06 DIAGNOSIS — N2581 Secondary hyperparathyroidism of renal origin: Secondary | ICD-10-CM | POA: Diagnosis not present

## 2023-12-10 ENCOUNTER — Encounter (HOSPITAL_COMMUNITY)
Admission: RE | Disposition: A | Discharge status: Home / Self Care | Payer: Self-pay | Source: Home / Self Care | Attending: Internal Medicine

## 2023-12-10 ENCOUNTER — Emergency Department (HOSPITAL_COMMUNITY)
Admission: EM | Admit: 2023-12-10 | Discharge: 2023-12-10 | Disposition: A | Discharge status: Home / Self Care | Payer: Medicare Other | Source: Home / Self Care | Attending: Emergency Medicine | Admitting: Emergency Medicine

## 2023-12-10 ENCOUNTER — Ambulatory Visit (HOSPITAL_COMMUNITY)
Admission: RE | Admit: 2023-12-10 | Discharge: 2023-12-10 | Disposition: A | Discharge status: Home / Self Care | Payer: Medicare Other | Attending: Internal Medicine | Admitting: Internal Medicine

## 2023-12-10 ENCOUNTER — Encounter (HOSPITAL_COMMUNITY): Payer: Self-pay

## 2023-12-10 ENCOUNTER — Other Ambulatory Visit: Payer: Self-pay

## 2023-12-10 DIAGNOSIS — Z794 Long term (current) use of insulin: Secondary | ICD-10-CM | POA: Insufficient documentation

## 2023-12-10 DIAGNOSIS — T82868A Thrombosis of vascular prosthetic devices, implants and grafts, initial encounter: Secondary | ICD-10-CM | POA: Diagnosis not present

## 2023-12-10 DIAGNOSIS — E1122 Type 2 diabetes mellitus with diabetic chronic kidney disease: Secondary | ICD-10-CM | POA: Diagnosis not present

## 2023-12-10 DIAGNOSIS — Y832 Surgical operation with anastomosis, bypass or graft as the cause of abnormal reaction of the patient, or of later complication, without mention of misadventure at the time of the procedure: Secondary | ICD-10-CM | POA: Diagnosis not present

## 2023-12-10 DIAGNOSIS — T8249XA Other complication of vascular dialysis catheter, initial encounter: Secondary | ICD-10-CM | POA: Insufficient documentation

## 2023-12-10 DIAGNOSIS — I120 Hypertensive chronic kidney disease with stage 5 chronic kidney disease or end stage renal disease: Secondary | ICD-10-CM | POA: Diagnosis not present

## 2023-12-10 DIAGNOSIS — N186 End stage renal disease: Secondary | ICD-10-CM | POA: Diagnosis not present

## 2023-12-10 DIAGNOSIS — Z992 Dependence on renal dialysis: Secondary | ICD-10-CM | POA: Insufficient documentation

## 2023-12-10 DIAGNOSIS — T829XXA Unspecified complication of cardiac and vascular prosthetic device, implant and graft, initial encounter: Secondary | ICD-10-CM

## 2023-12-10 DIAGNOSIS — D631 Anemia in chronic kidney disease: Secondary | ICD-10-CM | POA: Diagnosis not present

## 2023-12-10 HISTORY — PX: PERIPHERAL VASCULAR THROMBECTOMY: CATH118306

## 2023-12-10 HISTORY — PX: DIALYSIS/PERMA CATHETER INSERTION: CATH118288

## 2023-12-10 SURGERY — PERIPHERAL VASCULAR THROMBECTOMY
Anesthesia: LOCAL

## 2023-12-10 MED ORDER — FENTANYL CITRATE (PF) 100 MCG/2ML IJ SOLN
INTRAMUSCULAR | Status: DC | PRN
  Administered 2023-12-10: 25 ug via INTRAVENOUS

## 2023-12-10 MED ORDER — ONDANSETRON HCL 4 MG PO TABS: 4.0000 mg | ORAL_TABLET | Freq: Three times a day (TID) | ORAL | Status: DC | PRN

## 2023-12-10 MED ORDER — HEPARIN (PORCINE) IN NACL 1000-0.9 UT/500ML-% IV SOLN
INTRAVENOUS | Status: DC | PRN
  Administered 2023-12-10: 500 mL

## 2023-12-10 MED ORDER — ACETAMINOPHEN 325 MG PO TABS
ORAL_TABLET | ORAL | Status: AC
  Filled 2023-12-10: qty 2

## 2023-12-10 MED ORDER — ACETAMINOPHEN 325 MG PO TABS
650.0000 mg | ORAL_TABLET | Freq: Four times a day (QID) | ORAL | Status: DC | PRN
  Administered 2023-12-10: 650 mg via ORAL

## 2023-12-10 MED ORDER — SODIUM CHLORIDE 0.9% FLUSH: 10.0000 mL | Freq: Two times a day (BID) | INTRAVENOUS | Status: DC

## 2023-12-10 MED ORDER — MIDAZOLAM HCL 2 MG/2ML IJ SOLN
INTRAMUSCULAR | Status: DC | PRN
  Administered 2023-12-10: 1 mg via INTRAVENOUS

## 2023-12-10 MED ORDER — MIDAZOLAM HCL 2 MG/2ML IJ SOLN
INTRAMUSCULAR | Status: AC
  Filled 2023-12-10: qty 2

## 2023-12-10 MED ORDER — HEPARIN SODIUM (PORCINE) 1000 UNIT/ML IJ SOLN
INTRAMUSCULAR | Status: DC | PRN
  Administered 2023-12-10: 5000 [IU] via INTRAVENOUS

## 2023-12-10 MED ORDER — LIDOCAINE HCL (PF) 1 % IJ SOLN
INTRAMUSCULAR | Status: AC
  Filled 2023-12-10: qty 30

## 2023-12-10 MED ORDER — HEPARIN SODIUM (PORCINE) 1000 UNIT/ML IJ SOLN
INTRAMUSCULAR | Status: AC
  Filled 2023-12-10: qty 10

## 2023-12-10 MED ORDER — LIDOCAINE HCL (PF) 1 % IJ SOLN
INTRAMUSCULAR | Status: DC | PRN
  Administered 2023-12-10 (×3): 5 mL via INTRADERMAL

## 2023-12-10 MED ORDER — FENTANYL CITRATE (PF) 100 MCG/2ML IJ SOLN
INTRAMUSCULAR | Status: AC
  Filled 2023-12-10: qty 2

## 2023-12-10 SURGICAL SUPPLY — 16 items
BALLN MUSTANG 4.0X40 75 (BALLOONS) ×2
BALLN MUSTANG 6.0X40 75 (BALLOONS) ×2
BALLN MUSTANG 8X80X75 (BALLOONS) ×2
BALLOON FOGARTY 5FR 40 (CATHETERS) IMPLANT
BALLOON MUSTANG 4.0X40 75 (BALLOONS) IMPLANT
BALLOON MUSTANG 6.0X40 75 (BALLOONS) IMPLANT
BALLOON MUSTANG 8X80X75 (BALLOONS) IMPLANT
CATH FOGARTY BALL 5FR 40CM (CATHETERS) ×2
CATH PALINDROME-P 23 W/VT (CATHETERS) IMPLANT
GUIDEWIRE ANGLED .035X150CM (WIRE) IMPLANT
KIT MICROPUNCTURE NIT STIFF (SHEATH) IMPLANT
SHEATH PINNACLE R/O II 7F 4CM (SHEATH) IMPLANT
SHEATH PROBE COVER 6X72 (BAG) IMPLANT
SYR MEDALLION 10ML (SYRINGE) IMPLANT
TRAY PV CATH (CUSTOM PROCEDURE TRAY) IMPLANT
WIRE TORQFLEX AUST .018X40CM (WIRE) IMPLANT

## 2023-12-10 NOTE — Op Note (Signed)
Patient presented with clotted AVG.   On exam AVG is clotted.  She's required numerous thrombectomies, the last of which was 11/01/23.  She has a VA stent in place.  She is consented for thrombectomy and possible TDC placement if thrombectomy unsuccessful.    Summary:  1) Unsuccessful percutaneous mechanical thrombectomy of the RUE AVG.   Please see note by Dr. Hetty Blend for the details. 2) Successful placement of a new 23 cm cuff to tip hemodialysis catheter (Palindrome) in the left internal jugular vein with the tip in the right atrium.  3) Recommendations to the dialysis unit TO NOT USE HEPARIN FOR THE FIRST 24 HOURS.  Description of procedure: Patient has had prior RIJ and LIJ tunnelled catheters.  Real time ultrasound showed a small RIJ and a large and patent LIJ.  Decision made to place [facility].   The left neck, chest and the catheter were prepped and draped in the usual sterile fashion.  Local anesthesia was provided by injecting lidocaine 1% at the neck site overlying the desired venotomy site. Using real time ultrasound guidance, I was able to cannulate the LIJ and advance the guidewire easily into the central circulation. The wire was then manipulated and advanced into the abdominal IVC. I then blunt dissected the tissue around the 5 Fr stylet until it was freely mobile. Then, after injecting local anesthesia into the desired track and exit site I made a small incision at the exit site and tunneled a 23 cm cuff to tip Palindrome dialysis catheter, pulling it out at the venotomy site. Sequential dilation with 12, 14, and finally the peelaway sheath were done with real time fluoroscopic guidance ensuring that we were straight always lined with the wire.   Then, I inserted the catheter over the wire and through the sheath. After removing the peelaway sheath, I adjusted the catheter until the tip of the catheter was positioned in the right atrium of the heart. The cuff of the catheter was positioned  in the subcutaneous tunnel with the cuff approximately 2 cm from the exit site.   Both limbs of the catheter were aspirated and flushed with excellent flow noted. Both limbs of the catheter were locked with heparin and sterile caps placed. The hub of the catheter was sutured to the chest wall with 2-0 nylon suture.   The neck incision was closed with a vertical mattress 3-0 plain gut sutures and a purse-string 3-0 plain gut was placed at the tunnel exit site. 2-0 loose nylon sutures secured the hub to the chest wall.                         Sterile dressings were placed, and the patient returned to recovery in stable condition.  Sedation: 1mg  Versed, Fentanyl.  Contrast. 4 mL  Monitoring: Because of the patient's comorbid conditions and sedation during the procedure, continuous EKG monitoring and O2 saturation monitoring was performed throughout the procedure by the RN. There were no abnormal arrhythmias encountered.  Complications: None.  Diagnoses:   N18.6 End stage renal disease Z99.2 Dependence on renal dialysis  Procedures Coding:  36558 Tunneled catheter insertion [id number] Ultrasound guidance 56213  Fluoroscopy guidance for catheter insertion. [id number] Contrast  Recommendations: Remove the suture in 3 weeks. 2.   Report any blood flow problems to [facility]. 3.  Refer to [facility] for new access.   Discharge: The patient was discharged home in stable condition. The patient was given education regarding  the care of the catheter and specific instructions in case of any problems.

## 2023-12-10 NOTE — Discharge Instructions (Signed)
Apply direct pressure to the catheter for 20 min. If bleeding continues, return.

## 2023-12-10 NOTE — ED Triage Notes (Signed)
Pt has [facility] cath for Dialysis on left chest - placed today @ 1300, discharged at 1600.  Approx 30 minutes it started bleeding - has saturated a large pad.  Pt is scheduled for Dialysis tomorrow @ 1700.

## 2023-12-10 NOTE — ED Provider Notes (Signed)
[place] EMERGENCY DEPARTMENT AT [facility] Provider Note   CSN: [id number] Arrival date & time: 12/10/23  1854     History  Chief Complaint  Patient presents with   Vascular Access Problem    Katie Garcia is a 82 y.o. female who presents emergency department with chief complaint of bleeding from her left subclavian dialysis catheter.  Patient with a clot in her vascular Ascot in the left upper extremity.  She had a catheter placed 4 hours prior to arrival.  By the time she got home her entire dressing was soaked through so she came here for further evaluation.  She has no other complaints HPI     Home Medications Prior to Admission medications   Medication Sig Start Date End Date Taking? Authorizing Provider  Accu-Chek FastClix Lancets MISC  03/31/18   [provider]  ACCU-CHEK GUIDE test strip 1 each by Other route in the morning and at bedtime. E11.22 10/06/18   [provider]  acetaminophen (TYLENOL) 500 MG tablet Take 500-1,000 mg by mouth every 6 (six) hours as needed for moderate pain or headache.    [provider]  allopurinol (ZYLOPRIM) 100 MG tablet Take 100 mg by mouth Every Tuesday,Thursday,and Saturday with dialysis. After hemodialysis 05/12/22   [provider]  B Complex-C-Zn-Folic Acid (DIALYVITE 800-ZINC 15) 0.8 MG TABS Take 1 tablet by mouth daily with supper. 06/30/21   [provider]  Blood Glucose Monitoring Suppl (ACCU-CHEK GUIDE) w/Device KIT     [provider]  calcium acetate (PHOSLO) 667 MG capsule Take 1,334 mg by mouth 3 (three) times daily with meals. 10/24/18   [provider]  carvedilol (COREG) 25 MG tablet Take 25 mg by mouth in the morning and at bedtime.     [provider]  Continuous Glucose Receiver (DEXCOM G7 RECEIVER) DEVI Use to monitor blood sugar. 05/15/23   Reather Littler, MD  Continuous Glucose Sensor (DEXCOM G7 SENSOR) MISC Change every 10 days 11/28/23    Altamese Vienna, MD  famotidine (PEPCID) 20 MG tablet Take 20 mg by mouth daily.    [provider]  guaifenesin (ROBITUSSIN) 100 MG/5ML syrup Take 100 mg by mouth 3 (three) times daily as needed for cough.    [provider]  insulin glargine (LANTUS SOLOSTAR) 100 UNIT/ML Solostar Pen INJECT 30 UNITS INTO THE SKIN DAILY. ADJUST AS DIRECTED 06/04/23   Reather Littler, MD  insulin lispro (HUMALOG KWIKPEN) 100 UNIT/ML KwikPen 18 units acbf Patient not taking: Reported on 10/29/2023 02/07/23   Reather Littler, MD  Insulin Pen Needle 31G X 5 MM MISC Use for insulin 10/20/22   Reather Littler, MD  insulin regular (NOVOLIN R) 100 units/mL injection 25 Units. 02/20/23   [provider]  loratadine (CLARITIN) 10 MG tablet Take 10 mg by mouth daily. 11/12/20   [provider]  meclizine (ANTIVERT) 25 MG tablet Take by mouth. 08/17/22   [provider]  RELION INSULIN SYR 0.5ML/31G 31G X 5/16" 0.5 ML MISC USE 4 times DAILY 10/03/22   Reather Littler, MD  rosuvastatin (CRESTOR) 10 MG tablet Take 10 mg by mouth in the morning.    [provider]  SYMBICORT 160-4.5 MCG/ACT inhaler Inhale 2 puffs into the lungs 2 (two) times daily.    [provider]      Allergies    Penicillins    Review of Systems   Review of Systems  Physical Exam Updated Vital Signs BP 129/67 (BP  Location: Right Wrist)   Pulse 97   Temp 98.3 F (36.8 C)   Resp 19   Ht 5\' 1"  (1.549 m)   Wt 88.5 kg   SpO2 100%   BMI 36.84 kg/m  Physical Exam Vitals and nursing note reviewed.  Constitutional:      General: She is not in acute distress.    Appearance: She is well-developed. She is not diaphoretic.  HENT:     Head: Normocephalic and atraumatic.     Right Ear: External ear normal.     Left Ear: External ear normal.     Nose: Nose normal.     Mouth/Throat:     Mouth: Mucous membranes are moist.  Eyes:     General: No scleral icterus.    Conjunctiva/sclera: Conjunctivae normal.   Cardiovascular:     Rate and Rhythm: Normal rate and regular rhythm.     Heart sounds: Normal heart sounds. No murmur heard.    No friction rub. No gallop.  Pulmonary:     Effort: Pulmonary effort is normal. No respiratory distress.     Breath sounds: Normal breath sounds.  Chest:     Comments: left subclavian vascular access dialysis catheter    Abdominal:     General: Bowel sounds are normal. There is no distension.     Palpations: Abdomen is soft. There is no mass.     Tenderness: There is no abdominal tenderness. There is no guarding.  Musculoskeletal:     Cervical back: Normal range of motion.  Skin:    General: Skin is warm and dry.  Neurological:     Mental Status: She is alert and oriented to person, place, and time.  Psychiatric:        Behavior: Behavior normal.   With a small amount of oozing noted from the inferior aspect of the dialysis catheter.  ED Results / Procedures / Treatments   Labs (all labs ordered are listed, but only abnormal results are displayed) Labs Reviewed - No data to display  EKG None  Radiology PERIPHERAL VASCULAR CATHETERIZATION Result Date: 12/10/2023 Images from the original result were not included.   Patient name: Katie Garcia         MRN: [id number]        DOB: 04/21/1941            Sex: female  12/10/2023 Pre-operative Diagnosis: ESRD on HD with thrombosed AVG Post-operative diagnosis:  Same Surgeon:  Daria Pastures, MD Procedure Performed:  Access of AVG toward venous aspect Thrombectomy with manual aspiration with a 7 French catheter Balloon angioplasty of the venous outflow with a 8 x 80 Mustang Access of the AVG toward the arterial aspect Cannulation of the proximal brachial artery Fogarty thrombectomy of the proximal aspect of the AVG Balloon angioplasty of the proximal aspect of the AVG with a 4 x 40 Mustang and 6 x 60 Mustang   Indications: [name] is a 82 year old female with ESRD on HD who presented for management of a  thrombosed left arm AVG.  Risks and benefits of AVG declot were reviewed, patient expressed understanding and was willing to proceed.  Findings: Thrombosed AVG with thrombosed outflow axillary vein that was nonresponsive to balloon angioplasty.             Procedure:  The patient was identified in the holding area and taken to the cath lab  The patient was then placed supine on the table and prepped and draped in the  usual sterile fashion.  A time out was called.  The AVG was accessed with a micropuncture needle toward the venous outflow. An 018 wire was advanced without resistance, a micropuncture sheath and this was upsized to a 7 [place] sheath over a Glidewire.  A straight 7 French catheter was placed to the central system and central venogram was obtained which demonstrated widely patent and brisk filling of the central venous system.  The 7 French catheter was then used to aspirate the venous limb of the AVG.  Fresh clot was returned with 4 passes.  The Glidewire was then replaced into the central venous system and the and an 8 x 80 Mustang was then used to balloon macerate the entirety of the of the venous limb and previously placed stent.  The graft was then accessed towards the arterial side and this was upsized to a 7 [place] sheath over a Glidewire.  The wire was then passed through the anastomosis and into the proximal brachial artery and over-the-wire Fogarty was used to pull the plug from the arterial limb, multiple passes were made but angiography from the brachial artery still not demonstrate any flow into the graft.  A Glidewire was replaced into the proximal brachial artery and the proximal aspect of the AVG and the anastomosis were angioplastied with a 4 x 40 Mustang balloon and multiple additional passes of the over-the-wire Fogarty were made at the arterial anastomosis.  The proximal portion of the AVG was angioplastied with a 6 x 40 balloon and attempted macerate the remaining clot.  Angiography  from the sheath pointed towards the venous and demonstrated a flow through the AV graft but with residual filling defect in the venous outflow.  The Glidewire was replaced and the 8 x 80 Mustang balloon was inflated in the venous outflow tract in order to macerate the residual thrombus.  Angiography continued to demonstrate residual thrombosis of the axillary vein despite multiple times and angioplasty.  The declot was aborted, wires and sheaths were removed and nylon sutures placed with excellent hemostasis. Dr. Glenna Fellows then elected to place a tunneled dialysis catheter.   Impression: Thrombosed left arm AVG, declot unsuccessful.   Daria Pastures MD [facility] [place] Office: [phone]    Procedures Procedures    Medications Ordered in ED Medications - No data to display  ED Course/ Medical Decision Making/ A&P                                 Medical Decision Making  Patient here for evaluation of bleeding from her dialysis catheter placed earlier today.  On evaluation there is only a mild amount of oozing.  Pressure dressing placed.  Patient observed in the emergency department for another 30 minutes without significant increase in bleeding or extravasation.  She is comfortable with discharge at this time.  She is following up tomorrow morning for dialysis.  Appropriate for discharge at this time        Final Clinical Impression(s) / ED Diagnoses Final diagnoses:  Complication associated with dialysis catheter    Rx / DC Orders ED Discharge Orders     None         Arthor Captain, PA-C 12/12/23 2006    Glyn Ade, MD 12/21/23 [phone]

## 2023-12-10 NOTE — H&P (Signed)
[facility]  VASCULAR H&P  HPI: Katie Garcia is an 82 y.o. female with ESRD on HD TTS, HTN, DM type 2, arthritis, anemia of ESRD who presents for evaluation and management of clotted dialysis graft.   Pt has history of recurrent thrombosis of LUE AVG.  Presents today for clotted AVG - was unable to dialyze on Sat when presented for treatment.  Review of recent treatment data shows AF stable ~1200 and Qb 400.  No major hypotension.  BP high on presentation SBP 190 - she didn't take meds this AM but is asymptomatic.    PMH: Past Medical History:  Diagnosis Date   Anemia    Arthritis    COVID    hospitalized   Diabetes mellitus without complication (HCC)    ESRD    Dialysis T/Th/Sa at [address]   GERD (gastroesophageal reflux disease)    Gout    Headache    Hypertension    Type 2 diabetes mellitus (HCC)    PSH: Past Surgical History:  Procedure Laterality Date   A/V FISTULAGRAM Left 04/05/2022   Procedure: A/V Fistulagram;  Surgeon: Victorino Sparrow, MD;  Location[facility] INVASIVE CV LAB;  Service: Cardiovascular;  Laterality: Left;   ABDOMINAL HYSTERECTOMY     AV FISTULA PLACEMENT Left 10/02/2016   Procedure: ARTERIOVENOUS (AV) FISTULA CREATION LEFT UPPER ARM;  Surgeon: Maeola Harman, MD;  Location: [facility] OR;  Service: Vascular;  Laterality: Left;   AV FISTULA PLACEMENT Right 07/14/2020   Procedure: RIGHT ARM ARTERIOVENOUS FISTULA CREATION;  Surgeon: Maeola Harman, MD;  Location: [facility] OR;  Service: Vascular;  Laterality: Right;   AV FISTULA PLACEMENT Left 05/16/2021   Procedure: INSERTION OF LEFT UPPER ARM ARTERIOVENOUS (AV) GORE-TEX GRAFT;  Surgeon: Cephus Shelling, MD;  Location: [facility] OR;  Service: Vascular;  Laterality: Left;   AV FISTULA PLACEMENT Right 07/13/2021   Procedure: RIGHT ARM ARTERIOVENOUS (AV) FISTULA GRAFT INSERTION;  Surgeon: Maeola Harman, MD;  Location: [facility] OR;  Service: Vascular;  Laterality: Right;   AV FISTULA  PLACEMENT Left 02/03/2022   Procedure: LEFT UPPER ARM ARTERIOVENOUS FISTULA CREATION;  Surgeon: Maeola Harman, MD;  Location: [facility] OR;  Service: Vascular;  Laterality: Left;   AV FISTULA PLACEMENT Left 06/23/2022   Procedure: INSERTION OF LEFT ARM ARTERIOVENOUS (AV) GORE-TEX GRAFT;  Surgeon: Maeola Harman, MD;  Location: [facility] OR;  Service: Vascular;  Laterality: Left;   BASCILIC VEIN TRANSPOSITION Left 05/17/2020   Procedure: Insertion of Left arm arteriovenous gortex graftarm ;  Surgeon: Chuck Hint, MD;  Location: [facility] OR;  Service: Vascular;  Laterality: Left;   CESAREAN SECTION     DIALYSIS/PERMA CATHETER INSERTION     IR FLUORO GUIDE CV LINE LEFT  02/09/2022   IR PTA VENOUS EXCEPT DIALYSIS CIRCUIT  02/09/2022   IR VENOCAVAGRAM SVC  02/09/2022   REVISON OF ARTERIOVENOUS FISTULA Right 09/15/2020   Procedure: REVISON OF RIGHT UPPER ARM  ARTERIOVENOUS FISTULA;  Surgeon: Maeola Harman, MD;  Location: [facility] OR;  Service: Vascular;  Laterality: Right;   UPPER EXTREMITY VENOGRAPHY Bilateral 04/18/2021   Procedure: UPPER EXTREMITY VENOGRAPHY;  Surgeon: Maeola Harman, MD;  Location: [facility] INVASIVE CV LAB;  Service: Cardiovascular;  Laterality: Bilateral;   UPPER EXTREMITY VENOGRAPHY Left 01/23/2022   Procedure: UPPER EXTREMITY VENOGRAPHY;  Surgeon: Maeola Harman, MD;  Location: [facility] INVASIVE CV LAB;  Service: Cardiovascular;  Laterality: Left;    Past Medical History:  Diagnosis Date   Anemia  Arthritis    COVID    hospitalized   Diabetes mellitus without complication (HCC)    ESRD    Dialysis T/Th/Sa at [address]   GERD (gastroesophageal reflux disease)    Gout    Headache    Hypertension    Type 2 diabetes mellitus (HCC)     Medications:  I have reviewed the patient's current medications.  Facility-Administered Medications Prior to Admission  Medication Dose Route Frequency Provider Last Rate Last Admin   0.9 %  sodium chloride  infusion  250 mL Intravenous PRN Maeola Harman, MD       sodium chloride flush (NS) 0.9 % injection 3 mL  3 mL Intravenous Q12H Maeola Harman, MD       sodium chloride flush (NS) 0.9 % injection 3 mL  3 mL Intravenous PRN Maeola Harman, MD       Medications Prior to Admission  Medication Sig Dispense Refill   Accu-Chek FastClix Lancets MISC      ACCU-CHEK GUIDE test strip 1 each by Other route in the morning and at bedtime. E11.22  1   acetaminophen (TYLENOL) 500 MG tablet Take 500-1,000 mg by mouth every 6 (six) hours as needed for moderate pain or headache.     allopurinol (ZYLOPRIM) 100 MG tablet Take 100 mg by mouth Every Tuesday,Thursday,and Saturday with dialysis. After hemodialysis     B Complex-C-Zn-Folic Acid (DIALYVITE 800-ZINC 15) 0.8 MG TABS Take 1 tablet by mouth daily with supper.     Blood Glucose Monitoring Suppl (ACCU-CHEK GUIDE) w/Device KIT      calcium acetate (PHOSLO) 667 MG capsule Take 1,334 mg by mouth 3 (three) times daily with meals.  3   carvedilol (COREG) 25 MG tablet Take 25 mg by mouth in the morning and at bedtime.      Continuous Glucose Receiver (DEXCOM G7 RECEIVER) DEVI Use to monitor blood sugar. 1 each 0   Continuous Glucose Sensor (DEXCOM G7 SENSOR) MISC Change every 10 days 3 each 2   famotidine (PEPCID) 20 MG tablet Take 20 mg by mouth daily.     guaifenesin (ROBITUSSIN) 100 MG/5ML syrup Take 100 mg by mouth 3 (three) times daily as needed for cough.     insulin glargine (LANTUS SOLOSTAR) 100 UNIT/ML Solostar Pen INJECT 30 UNITS INTO THE SKIN DAILY. ADJUST AS DIRECTED 45 mL 1   insulin lispro (HUMALOG KWIKPEN) 100 UNIT/ML KwikPen 18 units acbf (Patient not taking: Reported on 10/29/2023) 15 mL 1   Insulin Pen Needle 31G X 5 MM MISC Use for insulin 50 each 1   insulin regular (NOVOLIN R) 100 units/mL injection 25 Units.     loratadine (CLARITIN) 10 MG tablet Take 10 mg by mouth daily.     meclizine (ANTIVERT) 25 MG tablet  Take by mouth.     RELION INSULIN SYR 0.5ML/31G 31G X 5/16" 0.5 ML MISC USE 4 times DAILY 100 each 2   rosuvastatin (CRESTOR) 10 MG tablet Take 10 mg by mouth in the morning.     SYMBICORT 160-4.5 MCG/ACT inhaler Inhale 2 puffs into the lungs 2 (two) times daily.      ALLERGIES:   Allergies  Allergen Reactions   Penicillins Rash    Did it involve swelling of the face/tongue/throat, SOB, or low BP? No Did it involve sudden or severe rash/hives, skin peeling, or any reaction on the inside of your mouth or nose? Yes Did you need to seek medical attention at a hospital  or doctor's office? N/A When did it last happen? N/A      If all above answers are "NO", may proceed with cephalosporin use.    FAM HX: Family History  Problem Relation Age of Onset   Diabetes Mother     Social History:   reports that she has never smoked. She has never used smokeless tobacco. She reports that she does not currently use alcohol. She reports that she does not use drugs.  ROS: 12 system ROS neg except per HPI   There were no vitals taken for this visit. PHYSICAL EXAM: Gen: elderly woman who is obese,  comfortable  ENT: Mallampati class 4   Neck: thick CV:  RRR Lungs: clear ant Extr: LUE AVG no bruit or thrill, normal overlying skin Neuro: AOx4    No results found for this or any previous visit (from the past 48 hours).  No results found.  Assessment/Plan Katie Garcia is an 82 y.o. female with ESRD on HD TTS, HTN, DM type 2, arthritis, anemia of ESRD who presents for evaluation and management of clotted dialysis graft.   **ESRD with clotted dialysis access:  patient consented for percutaneous thrombectomy and if unsuccessful for placement of [facility] for dialysis access.  Alternatives, risk and benefits, complications discussed.  Pt agreeable to proceed.    Tyler Pita 12/10/2023, 11:47 AM

## 2023-12-10 NOTE — Op Note (Signed)
    Patient name: Katie Garcia MRN: [id number] DOB: 1941-05-19 Sex: female  12/10/2023 Pre-operative Diagnosis: ESRD on HD with thrombosed AVG Post-operative diagnosis:  Same Surgeon:  Daria Pastures, MD Procedure Performed:  Access of AVG toward venous aspect Thrombectomy with manual aspiration with a 7 French catheter Balloon angioplasty of the venous outflow with a 8 x 80 Mustang Access of the AVG toward the arterial aspect Cannulation of the proximal brachial artery Fogarty thrombectomy of the proximal aspect of the AVG Balloon angioplasty of the proximal aspect of the AVG with a 4 x 40 Mustang and 6 x 60 Mustang   Indications: [name] is a 82 year old female with ESRD on HD who presented for management of a thrombosed left arm AVG.  Risks and benefits of AVG declot were reviewed, patient expressed understanding and was willing to proceed.  Findings: Thrombosed AVG with thrombosed outflow axillary vein that was nonresponsive to balloon angioplasty.   Procedure:  The patient was identified in the holding area and taken to the cath lab  The patient was then placed supine on the table and prepped and draped in the usual sterile fashion.  A time out was called.  The AVG was accessed with a micropuncture needle toward the venous outflow. An 018 wire was advanced without resistance, a micropuncture sheath and this was upsized to a 7 [place] sheath over a Glidewire.  A straight 7 French catheter was placed to the central system and central venogram was obtained which demonstrated widely patent and brisk filling of the central venous system.  The 7 French catheter was then used to aspirate the venous limb of the AVG.  Fresh clot was returned with 4 passes.  The Glidewire was then replaced into the central venous system and the and an 8 x 80 Mustang was then used to balloon macerate the entirety of the of the venous limb and previously placed stent.  The graft was then accessed towards the  arterial side and this was upsized to a 7 [place] sheath over a Glidewire.  The wire was then passed through the anastomosis and into the proximal brachial artery and over-the-wire Fogarty was used to pull the plug from the arterial limb, multiple passes were made but angiography from the brachial artery still not demonstrate any flow into the graft.  A Glidewire was replaced into the proximal brachial artery and the proximal aspect of the AVG and the anastomosis were angioplastied with a 4 x 40 Mustang balloon and multiple additional passes of the over-the-wire Fogarty were made at the arterial anastomosis.  The proximal portion of the AVG was angioplastied with a 6 x 40 balloon and attempted macerate the remaining clot.  Angiography from the sheath pointed towards the venous and demonstrated a flow through the AV graft but with residual filling defect in the venous outflow.  The Glidewire was replaced and the 8 x 80 Mustang balloon was inflated in the venous outflow tract in order to macerate the residual thrombus.  Angiography continued to demonstrate residual thrombosis of the axillary vein despite multiple times and angioplasty.  The declot was aborted, wires and sheaths were removed and nylon sutures placed with excellent hemostasis. Dr. Glenna Fellows then elected to place a tunneled dialysis catheter.    Impression: Thrombosed left arm AVG, declot unsuccessful.   Daria Pastures MD [facility] [place] Office: [phone]

## 2023-12-11 ENCOUNTER — Encounter (HOSPITAL_COMMUNITY): Payer: Self-pay | Admitting: Vascular Surgery

## 2023-12-11 DIAGNOSIS — D688 Other specified coagulation defects: Secondary | ICD-10-CM | POA: Diagnosis not present

## 2023-12-11 DIAGNOSIS — D509 Iron deficiency anemia, unspecified: Secondary | ICD-10-CM | POA: Diagnosis not present

## 2023-12-11 DIAGNOSIS — N2581 Secondary hyperparathyroidism of renal origin: Secondary | ICD-10-CM | POA: Diagnosis not present

## 2023-12-11 DIAGNOSIS — N186 End stage renal disease: Secondary | ICD-10-CM | POA: Diagnosis not present

## 2023-12-11 DIAGNOSIS — Z992 Dependence on renal dialysis: Secondary | ICD-10-CM | POA: Diagnosis not present

## 2023-12-11 DIAGNOSIS — D631 Anemia in chronic kidney disease: Secondary | ICD-10-CM | POA: Diagnosis not present

## 2023-12-11 DIAGNOSIS — T829XXA Unspecified complication of cardiac and vascular prosthetic device, implant and graft, initial encounter: Secondary | ICD-10-CM | POA: Diagnosis not present

## 2023-12-11 DIAGNOSIS — R519 Headache, unspecified: Secondary | ICD-10-CM | POA: Diagnosis not present

## 2023-12-12 DIAGNOSIS — Z992 Dependence on renal dialysis: Secondary | ICD-10-CM | POA: Diagnosis not present

## 2023-12-12 DIAGNOSIS — N186 End stage renal disease: Secondary | ICD-10-CM | POA: Diagnosis not present

## 2023-12-12 DIAGNOSIS — N2581 Secondary hyperparathyroidism of renal origin: Secondary | ICD-10-CM | POA: Diagnosis not present

## 2023-12-12 DIAGNOSIS — D688 Other specified coagulation defects: Secondary | ICD-10-CM | POA: Diagnosis not present

## 2023-12-12 DIAGNOSIS — D631 Anemia in chronic kidney disease: Secondary | ICD-10-CM | POA: Diagnosis not present

## 2023-12-12 DIAGNOSIS — R519 Headache, unspecified: Secondary | ICD-10-CM | POA: Diagnosis not present

## 2023-12-12 DIAGNOSIS — D509 Iron deficiency anemia, unspecified: Secondary | ICD-10-CM | POA: Diagnosis not present

## 2023-12-13 DIAGNOSIS — D509 Iron deficiency anemia, unspecified: Secondary | ICD-10-CM | POA: Diagnosis not present

## 2023-12-13 DIAGNOSIS — N2581 Secondary hyperparathyroidism of renal origin: Secondary | ICD-10-CM | POA: Diagnosis not present

## 2023-12-13 DIAGNOSIS — Z992 Dependence on renal dialysis: Secondary | ICD-10-CM | POA: Diagnosis not present

## 2023-12-13 DIAGNOSIS — D631 Anemia in chronic kidney disease: Secondary | ICD-10-CM | POA: Diagnosis not present

## 2023-12-13 DIAGNOSIS — N186 End stage renal disease: Secondary | ICD-10-CM | POA: Diagnosis not present

## 2023-12-13 DIAGNOSIS — R519 Headache, unspecified: Secondary | ICD-10-CM | POA: Diagnosis not present

## 2023-12-13 DIAGNOSIS — D688 Other specified coagulation defects: Secondary | ICD-10-CM | POA: Diagnosis not present

## 2023-12-15 DIAGNOSIS — N186 End stage renal disease: Secondary | ICD-10-CM | POA: Diagnosis not present

## 2023-12-15 DIAGNOSIS — D688 Other specified coagulation defects: Secondary | ICD-10-CM | POA: Diagnosis not present

## 2023-12-15 DIAGNOSIS — Z992 Dependence on renal dialysis: Secondary | ICD-10-CM | POA: Diagnosis not present

## 2023-12-15 DIAGNOSIS — D631 Anemia in chronic kidney disease: Secondary | ICD-10-CM | POA: Diagnosis not present

## 2023-12-15 DIAGNOSIS — R519 Headache, unspecified: Secondary | ICD-10-CM | POA: Diagnosis not present

## 2023-12-15 DIAGNOSIS — N2581 Secondary hyperparathyroidism of renal origin: Secondary | ICD-10-CM | POA: Diagnosis not present

## 2023-12-15 DIAGNOSIS — D509 Iron deficiency anemia, unspecified: Secondary | ICD-10-CM | POA: Diagnosis not present

## 2023-12-17 ENCOUNTER — Ambulatory Visit: Payer: Medicare Other | Admitting: Podiatry

## 2023-12-17 DIAGNOSIS — N2581 Secondary hyperparathyroidism of renal origin: Secondary | ICD-10-CM | POA: Diagnosis not present

## 2023-12-17 DIAGNOSIS — N186 End stage renal disease: Secondary | ICD-10-CM | POA: Diagnosis not present

## 2023-12-17 DIAGNOSIS — D509 Iron deficiency anemia, unspecified: Secondary | ICD-10-CM | POA: Diagnosis not present

## 2023-12-17 DIAGNOSIS — R519 Headache, unspecified: Secondary | ICD-10-CM | POA: Diagnosis not present

## 2023-12-17 DIAGNOSIS — D688 Other specified coagulation defects: Secondary | ICD-10-CM | POA: Diagnosis not present

## 2023-12-17 DIAGNOSIS — Z992 Dependence on renal dialysis: Secondary | ICD-10-CM | POA: Diagnosis not present

## 2023-12-17 DIAGNOSIS — D631 Anemia in chronic kidney disease: Secondary | ICD-10-CM | POA: Diagnosis not present

## 2023-12-20 DIAGNOSIS — R519 Headache, unspecified: Secondary | ICD-10-CM | POA: Diagnosis not present

## 2023-12-20 DIAGNOSIS — Z992 Dependence on renal dialysis: Secondary | ICD-10-CM | POA: Diagnosis not present

## 2023-12-20 DIAGNOSIS — N2581 Secondary hyperparathyroidism of renal origin: Secondary | ICD-10-CM | POA: Diagnosis not present

## 2023-12-20 DIAGNOSIS — N186 End stage renal disease: Secondary | ICD-10-CM | POA: Diagnosis not present

## 2023-12-20 DIAGNOSIS — D688 Other specified coagulation defects: Secondary | ICD-10-CM | POA: Diagnosis not present

## 2023-12-20 DIAGNOSIS — D509 Iron deficiency anemia, unspecified: Secondary | ICD-10-CM | POA: Diagnosis not present

## 2023-12-20 DIAGNOSIS — D631 Anemia in chronic kidney disease: Secondary | ICD-10-CM | POA: Diagnosis not present

## 2023-12-22 DIAGNOSIS — N186 End stage renal disease: Secondary | ICD-10-CM | POA: Diagnosis not present

## 2023-12-22 DIAGNOSIS — D688 Other specified coagulation defects: Secondary | ICD-10-CM | POA: Diagnosis not present

## 2023-12-22 DIAGNOSIS — Z992 Dependence on renal dialysis: Secondary | ICD-10-CM | POA: Diagnosis not present

## 2023-12-22 DIAGNOSIS — D509 Iron deficiency anemia, unspecified: Secondary | ICD-10-CM | POA: Diagnosis not present

## 2023-12-22 DIAGNOSIS — R519 Headache, unspecified: Secondary | ICD-10-CM | POA: Diagnosis not present

## 2023-12-22 DIAGNOSIS — N2581 Secondary hyperparathyroidism of renal origin: Secondary | ICD-10-CM | POA: Diagnosis not present

## 2023-12-22 DIAGNOSIS — D631 Anemia in chronic kidney disease: Secondary | ICD-10-CM | POA: Diagnosis not present

## 2023-12-24 DIAGNOSIS — N186 End stage renal disease: Secondary | ICD-10-CM | POA: Diagnosis not present

## 2023-12-24 DIAGNOSIS — Z992 Dependence on renal dialysis: Secondary | ICD-10-CM | POA: Diagnosis not present

## 2023-12-24 DIAGNOSIS — R519 Headache, unspecified: Secondary | ICD-10-CM | POA: Diagnosis not present

## 2023-12-24 DIAGNOSIS — D631 Anemia in chronic kidney disease: Secondary | ICD-10-CM | POA: Diagnosis not present

## 2023-12-24 DIAGNOSIS — D688 Other specified coagulation defects: Secondary | ICD-10-CM | POA: Diagnosis not present

## 2023-12-24 DIAGNOSIS — N2581 Secondary hyperparathyroidism of renal origin: Secondary | ICD-10-CM | POA: Diagnosis not present

## 2023-12-24 DIAGNOSIS — D509 Iron deficiency anemia, unspecified: Secondary | ICD-10-CM | POA: Diagnosis not present

## 2023-12-25 DIAGNOSIS — E1122 Type 2 diabetes mellitus with diabetic chronic kidney disease: Secondary | ICD-10-CM | POA: Diagnosis not present

## 2023-12-25 DIAGNOSIS — N186 End stage renal disease: Secondary | ICD-10-CM | POA: Diagnosis not present

## 2023-12-25 DIAGNOSIS — Z992 Dependence on renal dialysis: Secondary | ICD-10-CM | POA: Diagnosis not present

## 2023-12-27 DIAGNOSIS — E1129 Type 2 diabetes mellitus with other diabetic kidney complication: Secondary | ICD-10-CM | POA: Diagnosis not present

## 2023-12-27 DIAGNOSIS — Z992 Dependence on renal dialysis: Secondary | ICD-10-CM | POA: Diagnosis not present

## 2023-12-27 DIAGNOSIS — D631 Anemia in chronic kidney disease: Secondary | ICD-10-CM | POA: Diagnosis not present

## 2023-12-27 DIAGNOSIS — D688 Other specified coagulation defects: Secondary | ICD-10-CM | POA: Diagnosis not present

## 2023-12-27 DIAGNOSIS — N2581 Secondary hyperparathyroidism of renal origin: Secondary | ICD-10-CM | POA: Diagnosis not present

## 2023-12-27 DIAGNOSIS — N186 End stage renal disease: Secondary | ICD-10-CM | POA: Diagnosis not present

## 2023-12-27 DIAGNOSIS — R519 Headache, unspecified: Secondary | ICD-10-CM | POA: Diagnosis not present

## 2023-12-29 DIAGNOSIS — Z992 Dependence on renal dialysis: Secondary | ICD-10-CM | POA: Diagnosis not present

## 2023-12-29 DIAGNOSIS — N2581 Secondary hyperparathyroidism of renal origin: Secondary | ICD-10-CM | POA: Diagnosis not present

## 2023-12-29 DIAGNOSIS — E1129 Type 2 diabetes mellitus with other diabetic kidney complication: Secondary | ICD-10-CM | POA: Diagnosis not present

## 2023-12-29 DIAGNOSIS — D631 Anemia in chronic kidney disease: Secondary | ICD-10-CM | POA: Diagnosis not present

## 2023-12-29 DIAGNOSIS — N186 End stage renal disease: Secondary | ICD-10-CM | POA: Diagnosis not present

## 2023-12-29 DIAGNOSIS — D688 Other specified coagulation defects: Secondary | ICD-10-CM | POA: Diagnosis not present

## 2023-12-29 DIAGNOSIS — R519 Headache, unspecified: Secondary | ICD-10-CM | POA: Diagnosis not present

## 2024-01-01 DIAGNOSIS — N2581 Secondary hyperparathyroidism of renal origin: Secondary | ICD-10-CM | POA: Diagnosis not present

## 2024-01-01 DIAGNOSIS — D688 Other specified coagulation defects: Secondary | ICD-10-CM | POA: Diagnosis not present

## 2024-01-01 DIAGNOSIS — R519 Headache, unspecified: Secondary | ICD-10-CM | POA: Diagnosis not present

## 2024-01-01 DIAGNOSIS — N186 End stage renal disease: Secondary | ICD-10-CM | POA: Diagnosis not present

## 2024-01-01 DIAGNOSIS — E1129 Type 2 diabetes mellitus with other diabetic kidney complication: Secondary | ICD-10-CM | POA: Diagnosis not present

## 2024-01-01 DIAGNOSIS — Z992 Dependence on renal dialysis: Secondary | ICD-10-CM | POA: Diagnosis not present

## 2024-01-01 DIAGNOSIS — D631 Anemia in chronic kidney disease: Secondary | ICD-10-CM | POA: Diagnosis not present

## 2024-01-03 DIAGNOSIS — N2581 Secondary hyperparathyroidism of renal origin: Secondary | ICD-10-CM | POA: Diagnosis not present

## 2024-01-03 DIAGNOSIS — D631 Anemia in chronic kidney disease: Secondary | ICD-10-CM | POA: Diagnosis not present

## 2024-01-03 DIAGNOSIS — Z992 Dependence on renal dialysis: Secondary | ICD-10-CM | POA: Diagnosis not present

## 2024-01-03 DIAGNOSIS — E1129 Type 2 diabetes mellitus with other diabetic kidney complication: Secondary | ICD-10-CM | POA: Diagnosis not present

## 2024-01-03 DIAGNOSIS — D688 Other specified coagulation defects: Secondary | ICD-10-CM | POA: Diagnosis not present

## 2024-01-03 DIAGNOSIS — N186 End stage renal disease: Secondary | ICD-10-CM | POA: Diagnosis not present

## 2024-01-03 DIAGNOSIS — R519 Headache, unspecified: Secondary | ICD-10-CM | POA: Diagnosis not present

## 2024-01-04 DIAGNOSIS — N186 End stage renal disease: Secondary | ICD-10-CM | POA: Diagnosis not present

## 2024-01-04 DIAGNOSIS — R519 Headache, unspecified: Secondary | ICD-10-CM | POA: Diagnosis not present

## 2024-01-04 DIAGNOSIS — E1129 Type 2 diabetes mellitus with other diabetic kidney complication: Secondary | ICD-10-CM | POA: Diagnosis not present

## 2024-01-04 DIAGNOSIS — Z992 Dependence on renal dialysis: Secondary | ICD-10-CM | POA: Diagnosis not present

## 2024-01-04 DIAGNOSIS — D631 Anemia in chronic kidney disease: Secondary | ICD-10-CM | POA: Diagnosis not present

## 2024-01-04 DIAGNOSIS — N2581 Secondary hyperparathyroidism of renal origin: Secondary | ICD-10-CM | POA: Diagnosis not present

## 2024-01-04 DIAGNOSIS — D688 Other specified coagulation defects: Secondary | ICD-10-CM | POA: Diagnosis not present

## 2024-01-08 DIAGNOSIS — N186 End stage renal disease: Secondary | ICD-10-CM | POA: Diagnosis not present

## 2024-01-08 DIAGNOSIS — N2581 Secondary hyperparathyroidism of renal origin: Secondary | ICD-10-CM | POA: Diagnosis not present

## 2024-01-08 DIAGNOSIS — R519 Headache, unspecified: Secondary | ICD-10-CM | POA: Diagnosis not present

## 2024-01-08 DIAGNOSIS — Z992 Dependence on renal dialysis: Secondary | ICD-10-CM | POA: Diagnosis not present

## 2024-01-08 DIAGNOSIS — D688 Other specified coagulation defects: Secondary | ICD-10-CM | POA: Diagnosis not present

## 2024-01-08 DIAGNOSIS — E1129 Type 2 diabetes mellitus with other diabetic kidney complication: Secondary | ICD-10-CM | POA: Diagnosis not present

## 2024-01-08 DIAGNOSIS — D631 Anemia in chronic kidney disease: Secondary | ICD-10-CM | POA: Diagnosis not present

## 2024-01-10 DIAGNOSIS — E1129 Type 2 diabetes mellitus with other diabetic kidney complication: Secondary | ICD-10-CM | POA: Diagnosis not present

## 2024-01-10 DIAGNOSIS — N2581 Secondary hyperparathyroidism of renal origin: Secondary | ICD-10-CM | POA: Diagnosis not present

## 2024-01-10 DIAGNOSIS — Z992 Dependence on renal dialysis: Secondary | ICD-10-CM | POA: Diagnosis not present

## 2024-01-10 DIAGNOSIS — D688 Other specified coagulation defects: Secondary | ICD-10-CM | POA: Diagnosis not present

## 2024-01-10 DIAGNOSIS — D631 Anemia in chronic kidney disease: Secondary | ICD-10-CM | POA: Diagnosis not present

## 2024-01-10 DIAGNOSIS — N186 End stage renal disease: Secondary | ICD-10-CM | POA: Diagnosis not present

## 2024-01-10 DIAGNOSIS — R519 Headache, unspecified: Secondary | ICD-10-CM | POA: Diagnosis not present

## 2024-01-12 DIAGNOSIS — D631 Anemia in chronic kidney disease: Secondary | ICD-10-CM | POA: Diagnosis not present

## 2024-01-12 DIAGNOSIS — N186 End stage renal disease: Secondary | ICD-10-CM | POA: Diagnosis not present

## 2024-01-12 DIAGNOSIS — E1129 Type 2 diabetes mellitus with other diabetic kidney complication: Secondary | ICD-10-CM | POA: Diagnosis not present

## 2024-01-12 DIAGNOSIS — D688 Other specified coagulation defects: Secondary | ICD-10-CM | POA: Diagnosis not present

## 2024-01-12 DIAGNOSIS — Z992 Dependence on renal dialysis: Secondary | ICD-10-CM | POA: Diagnosis not present

## 2024-01-12 DIAGNOSIS — N2581 Secondary hyperparathyroidism of renal origin: Secondary | ICD-10-CM | POA: Diagnosis not present

## 2024-01-12 DIAGNOSIS — R519 Headache, unspecified: Secondary | ICD-10-CM | POA: Diagnosis not present

## 2024-01-15 DIAGNOSIS — R519 Headache, unspecified: Secondary | ICD-10-CM | POA: Diagnosis not present

## 2024-01-15 DIAGNOSIS — Z992 Dependence on renal dialysis: Secondary | ICD-10-CM | POA: Diagnosis not present

## 2024-01-15 DIAGNOSIS — D688 Other specified coagulation defects: Secondary | ICD-10-CM | POA: Diagnosis not present

## 2024-01-15 DIAGNOSIS — N186 End stage renal disease: Secondary | ICD-10-CM | POA: Diagnosis not present

## 2024-01-15 DIAGNOSIS — E1129 Type 2 diabetes mellitus with other diabetic kidney complication: Secondary | ICD-10-CM | POA: Diagnosis not present

## 2024-01-15 DIAGNOSIS — N2581 Secondary hyperparathyroidism of renal origin: Secondary | ICD-10-CM | POA: Diagnosis not present

## 2024-01-15 DIAGNOSIS — D631 Anemia in chronic kidney disease: Secondary | ICD-10-CM | POA: Diagnosis not present

## 2024-01-16 ENCOUNTER — Ambulatory Visit: Payer: Medicare Other | Admitting: Vascular Surgery

## 2024-01-17 DIAGNOSIS — N186 End stage renal disease: Secondary | ICD-10-CM | POA: Diagnosis not present

## 2024-01-17 DIAGNOSIS — N2581 Secondary hyperparathyroidism of renal origin: Secondary | ICD-10-CM | POA: Diagnosis not present

## 2024-01-17 DIAGNOSIS — E1129 Type 2 diabetes mellitus with other diabetic kidney complication: Secondary | ICD-10-CM | POA: Diagnosis not present

## 2024-01-17 DIAGNOSIS — R519 Headache, unspecified: Secondary | ICD-10-CM | POA: Diagnosis not present

## 2024-01-17 DIAGNOSIS — D688 Other specified coagulation defects: Secondary | ICD-10-CM | POA: Diagnosis not present

## 2024-01-17 DIAGNOSIS — Z992 Dependence on renal dialysis: Secondary | ICD-10-CM | POA: Diagnosis not present

## 2024-01-17 DIAGNOSIS — D631 Anemia in chronic kidney disease: Secondary | ICD-10-CM | POA: Diagnosis not present

## 2024-01-18 ENCOUNTER — Ambulatory Visit (HOSPITAL_COMMUNITY)
Admission: RE | Admit: 2024-01-18 | Discharge: 2024-01-18 | Disposition: A | Discharge status: Home / Self Care | Payer: Medicare Other | Attending: Internal Medicine | Admitting: Internal Medicine

## 2024-01-18 ENCOUNTER — Encounter (HOSPITAL_COMMUNITY)
Admission: RE | Disposition: A | Discharge status: Home / Self Care | Payer: Self-pay | Source: Home / Self Care | Attending: Internal Medicine

## 2024-01-18 ENCOUNTER — Other Ambulatory Visit: Payer: Self-pay

## 2024-01-18 DIAGNOSIS — T8249XA Other complication of vascular dialysis catheter, initial encounter: Secondary | ICD-10-CM | POA: Diagnosis not present

## 2024-01-18 DIAGNOSIS — Y839 Surgical procedure, unspecified as the cause of abnormal reaction of the patient, or of later complication, without mention of misadventure at the time of the procedure: Secondary | ICD-10-CM | POA: Diagnosis not present

## 2024-01-18 DIAGNOSIS — I120 Hypertensive chronic kidney disease with stage 5 chronic kidney disease or end stage renal disease: Secondary | ICD-10-CM | POA: Diagnosis not present

## 2024-01-18 DIAGNOSIS — D631 Anemia in chronic kidney disease: Secondary | ICD-10-CM | POA: Insufficient documentation

## 2024-01-18 DIAGNOSIS — N2581 Secondary hyperparathyroidism of renal origin: Secondary | ICD-10-CM | POA: Diagnosis not present

## 2024-01-18 DIAGNOSIS — E1129 Type 2 diabetes mellitus with other diabetic kidney complication: Secondary | ICD-10-CM | POA: Diagnosis not present

## 2024-01-18 DIAGNOSIS — E1122 Type 2 diabetes mellitus with diabetic chronic kidney disease: Secondary | ICD-10-CM | POA: Insufficient documentation

## 2024-01-18 DIAGNOSIS — Z992 Dependence on renal dialysis: Secondary | ICD-10-CM | POA: Diagnosis not present

## 2024-01-18 DIAGNOSIS — D688 Other specified coagulation defects: Secondary | ICD-10-CM | POA: Diagnosis not present

## 2024-01-18 DIAGNOSIS — N186 End stage renal disease: Secondary | ICD-10-CM | POA: Diagnosis not present

## 2024-01-18 DIAGNOSIS — R519 Headache, unspecified: Secondary | ICD-10-CM | POA: Diagnosis not present

## 2024-01-18 HISTORY — PX: DIALYSIS/PERMA CATHETER INSERTION: CATH118288

## 2024-01-18 SURGERY — DIALYSIS/PERMA CATHETER INSERTION
Anesthesia: LOCAL

## 2024-01-18 MED ORDER — LIDOCAINE HCL (PF) 1 % IJ SOLN
INTRAMUSCULAR | Status: AC
  Filled 2024-01-18: qty 30

## 2024-01-18 MED ORDER — HEPARIN SODIUM (PORCINE) 1000 UNIT/ML IJ SOLN
INTRAMUSCULAR | Status: DC | PRN
  Administered 2024-01-18 (×2): 1900 [IU] via INTRAVENOUS

## 2024-01-18 MED ORDER — HEPARIN (PORCINE) IN NACL 1000-0.9 UT/500ML-% IV SOLN
INTRAVENOUS | Status: DC | PRN
  Administered 2024-01-18: 500 mL

## 2024-01-18 MED ORDER — MIDAZOLAM HCL 2 MG/2ML IJ SOLN
INTRAMUSCULAR | Status: AC
Start: 2024-01-18 — End: ?
  Filled 2024-01-18: qty 2

## 2024-01-18 MED ORDER — IODIXANOL 320 MG/ML IV SOLN
INTRAVENOUS | Status: DC | PRN
  Administered 2024-01-18: 3 mL

## 2024-01-18 MED ORDER — LIDOCAINE HCL (PF) 1 % IJ SOLN
INTRAMUSCULAR | Status: DC | PRN
  Administered 2024-01-18: 10 mL via INTRADERMAL

## 2024-01-18 MED ORDER — HEPARIN SODIUM (PORCINE) 1000 UNIT/ML IJ SOLN
INTRAMUSCULAR | Status: AC
Start: 2024-01-18 — End: ?
  Filled 2024-01-18: qty 10

## 2024-01-18 MED ORDER — FENTANYL CITRATE (PF) 100 MCG/2ML IJ SOLN
INTRAMUSCULAR | Status: AC
  Filled 2024-01-18: qty 2

## 2024-01-18 SURGICAL SUPPLY — 4 items
CATH PALINDROME-SP 14.5FX23 (CATHETERS) IMPLANT
COVER DOME SNAP 22 D (MISCELLANEOUS) IMPLANT
GUIDEWIRE ZIPWIRE 035/150 ANGL (WIRE) IMPLANT
TRAY PV CATH (CUSTOM PROCEDURE TRAY) ×1 IMPLANT

## 2024-01-18 NOTE — Op Note (Signed)
Patient presents with poor flows in her left  IJ tunneled hemodialysis catheter (23 cm cuff to tip- Palindrome) that was placed here 5 weeks ago. On examination, aspiration from both ports is good however flushing from the venous port is sluggish.. Chest x-ray confirms the catheter tip is appropriately positioned in the RA. The catheter cuff is 1/2cm deep to the exit site.    Summary:  1) The patient had a successful 28 cm CTT Palindrome hemodialysis catheter exchange in the left internal jugular vein. 2) No sheath noted through either port. 3) Okay to use catheter immediately.  Description of procedure: The left neck, chest and the catheter were prepped and draped in the usual sterile fashion. The exit site and adjacent tunnel tract were anesthetized with lidocaine 1% with epinephrine. The cuff was dissected free with a curved Kelly and manual traction. The catheter was withdrawn and venogram through each of the ports was performed; there was no fibrin sheath evident through either port.   A hydrophilic wire was manipulated and advanced through the arterial port and the tip was parked in the IVC. The catheter was completely removed and noted to be entirely intact. A new 23 cm cuff to tip Palindrome catheter was inserted over the guidewire and the tip parked in the right atrium and at that point the cuff was approximately 1 cm deep to the exit site.   Aspiration and flushing of both limbs of the catheter confirmed excellent flow. No kinks were visible on fluoroscopic imaging. Both limbs of the catheter were locked with citrate and sterile caps were placed.   The hub was secured on to the chest wall with 2-0 nylon wing sutures.  Sterile dressings were placed, and the patient returned to recovery in stable condition.  Sedation: none, local only Sedation time: n/a  Contrast: 4 mL  Monitoring: Because of the patient's comorbid conditions and sedation during the procedure, continuous EKG monitoring  and O2 saturation monitoring was performed throughout the procedure by the RN. There were no abnormal arrhythmias encountered.  Complications: None.  Diagnoses:   T82.49XA Other complication of vascular dialysis catheter (Poor flows) N18.6 End stage renal disease  Z99.2 Dialysis dependence  Procedures Coding:  36581 Tunneled catheter exchange 77001  Fluoroscopy guidance for catheter exchange. G6440 Contrast  Recommendations: Remove the suture in 3 weeks. 2.   Report any blood flow problems to [facility].  Discharge: The patient was discharged home in stable condition. The patient was given education regarding the care of the catheter and specific instructions in case of any problems.

## 2024-01-18 NOTE — Discharge Instructions (Signed)
General care instructions: - You should be able to eat, drink, and resume your normal medications. - Avoid any strenuous activity for the remainder of the day. Potential complications: - You are bleeding at the exit or venotomy site and it will not stop with direct pressure; if there is a slow ooze apply pressure over the venotomy site neck region where the catheter can be felt for 5 minutes.  - You have a fever, swelling, see redness or feel heat over the tunnel or exit site. 3.  Medication instructions: - Continue routine medications unless otherwise instructed. 4. Please have your sutures removed at the hub site in 3 weeks at dialysis.

## 2024-01-18 NOTE — H&P (Signed)
[facility]  VASCULAR H&P  HPI: Katie Garcia is an 83 y.o. female with ESRD on HD TTS, HTN, DM type 2, arthritis, anemia of ESRD who presents for malfunctioning HD catheter.   Pt had failed thrombectomy on `12/10/23 and required [facility] placement.  She's been successfully using the catheter until tomorrow was unable to pull from venous.  She presents today for exchange.  No fevers, chills, exit site pain or drainage.  She requests no IV sedation only local lidocaine today. Plan is HD after [facility] exchange today.   PMH: Past Medical History:  Diagnosis Date   Anemia    Arthritis    COVID    hospitalized   Diabetes mellitus without complication (HCC)    ESRD    Dialysis T/Th/Sa at [address]   GERD (gastroesophageal reflux disease)    Gout    Headache    Hypertension    Type 2 diabetes mellitus (HCC)    PSH: Past Surgical History:  Procedure Laterality Date   A/V FISTULAGRAM Left 04/05/2022   Procedure: A/V Fistulagram;  Surgeon: Victorino Sparrow, MD;  Location: [facility] INVASIVE CV LAB;  Service: Cardiovascular;  Laterality: Left;   ABDOMINAL HYSTERECTOMY     AV FISTULA PLACEMENT Left 10/02/2016   Procedure: ARTERIOVENOUS (AV) FISTULA CREATION LEFT UPPER ARM;  Surgeon: Maeola Harman, MD;  Location: [facility] OR;  Service: Vascular;  Laterality: Left;   AV FISTULA PLACEMENT Right 07/14/2020   Procedure: RIGHT ARM ARTERIOVENOUS FISTULA CREATION;  Surgeon: Maeola Harman, MD;  Location: [facility] OR;  Service: Vascular;  Laterality: Right;   AV FISTULA PLACEMENT Left 05/16/2021   Procedure: INSERTION OF LEFT UPPER ARM ARTERIOVENOUS (AV) GORE-TEX GRAFT;  Surgeon: Cephus Shelling, MD;  Location: [facility] OR;  Service: Vascular;  Laterality: Left;   AV FISTULA PLACEMENT Right 07/13/2021   Procedure: RIGHT ARM ARTERIOVENOUS (AV) FISTULA GRAFT INSERTION;  Surgeon: Maeola Harman, MD;  Location: [facility] OR;  Service: Vascular;  Laterality: Right;   AV FISTULA PLACEMENT Left  02/03/2022   Procedure: LEFT UPPER ARM ARTERIOVENOUS FISTULA CREATION;  Surgeon: Maeola Harman, MD;  Location: [facility] OR;  Service: Vascular;  Laterality: Left;   AV FISTULA PLACEMENT Left 06/23/2022   Procedure: INSERTION OF LEFT ARM ARTERIOVENOUS (AV) GORE-TEX GRAFT;  Surgeon: Maeola Harman, MD;  Location: [facility] OR;  Service: Vascular;  Laterality: Left;   BASCILIC VEIN TRANSPOSITION Left 05/17/2020   Procedure: Insertion of Left arm arteriovenous gortex graftarm ;  Surgeon: Chuck Hint, MD;  Location: [facility] OR;  Service: Vascular;  Laterality: Left;   CESAREAN SECTION     DIALYSIS/PERMA CATHETER INSERTION     DIALYSIS/PERMA CATHETER INSERTION  12/10/2023   Procedure: DIALYSIS/PERMA CATHETER INSERTION;  Surgeon: Daria Pastures, MD;  Location: [facility] INVASIVE CV LAB;  Service: Cardiovascular;;   IR FLUORO GUIDE CV LINE LEFT  02/09/2022   IR PTA VENOUS EXCEPT DIALYSIS CIRCUIT  02/09/2022   IR VENOCAVAGRAM SVC  02/09/2022   PERIPHERAL VASCULAR THROMBECTOMY N/A 12/10/2023   Procedure: PERIPHERAL VASCULAR THROMBECTOMY;  Surgeon: Daria Pastures, MD;  [facility]: [facility] INVASIVE CV LAB;  Service: Cardiovascular;  Laterality: N/A;   REVISON OF ARTERIOVENOUS FISTULA Right 09/15/2020   Procedure: REVISON OF RIGHT UPPER ARM  ARTERIOVENOUS FISTULA;  Surgeon: Maeola Harman, MD;  Location: [facility] OR;  Service: Vascular;  Laterality: Right;   UPPER EXTREMITY VENOGRAPHY Bilateral 04/18/2021   Procedure: UPPER EXTREMITY VENOGRAPHY;  Surgeon: Maeola Harman, MD;  Location: [facility] INVASIVE  CV LAB;  Service: Cardiovascular;  Laterality: Bilateral;   UPPER EXTREMITY VENOGRAPHY Left 01/23/2022   Procedure: UPPER EXTREMITY VENOGRAPHY;  Surgeon: Maeola Harman, MD;  Location: [facility] INVASIVE CV LAB;  Service: Cardiovascular;  Laterality: Left;    Past Medical History:  Diagnosis Date   Anemia    Arthritis    COVID    hospitalized   Diabetes mellitus without complication (HCC)     ESRD    Dialysis T/Th/Sa at [address]   GERD (gastroesophageal reflux disease)    Gout    Headache    Hypertension    Type 2 diabetes mellitus (HCC)     Medications:  I have reviewed the patient's current medications.  No medications prior to admission.    ALLERGIES:   Allergies  Allergen Reactions   Penicillins Rash    Did it involve swelling of the face/tongue/throat, SOB, or low BP? No Did it involve sudden or severe rash/hives, skin peeling, or any reaction on the inside of your mouth or nose? Yes Did you need to seek medical attention at a hospital or doctor's office? N/A When did it last happen? N/A      If all above answers are "NO", may proceed with cephalosporin use.    FAM HX: Family History  Problem Relation Age of Onset   Diabetes Mother     Social History:   reports that she has never smoked. She has never used smokeless tobacco. She reports that she does not currently use alcohol. She reports that she does not use drugs.  ROS: 12 system ROS neg except per HPI   Blood pressure (!) 205/96, pulse 82, resp. rate 15, weight 86.2 kg, SpO2 100%. PHYSICAL EXAM: Gen: elderly woman who is obese,  comfortable  ENT: Mallampati class 4   Neck: thick CV:  RRR Lungs: clear ant Extr: LUE AVG no bruit or thrill Neuro: AOx4 HD access [facility] TDC c/d/I, exit site normal    No results found for this or any previous visit (from the past 48 hours).  No results found.  Assessment/Plan Katie Garcia is an 83 y.o. female with ESRD on HD TTS, HTN, DM type 2, arthritis, anemia of ESRD who presents for evaluation and management of malfunctioning HD catheter.   **ESRD with access dysfunction:  patient consented for exchange of [facility] for dialysis access.  Will assess catheter placement and for fibrin sheath which if present will be disrupted with balloon angioplasty.  Alternatives, risk and benefits, complications discussed.  Pt agreeable to proceed.    Tyler Pita 01/18/2024, 10:26 AM

## 2024-01-19 DIAGNOSIS — D631 Anemia in chronic kidney disease: Secondary | ICD-10-CM | POA: Diagnosis not present

## 2024-01-19 DIAGNOSIS — D688 Other specified coagulation defects: Secondary | ICD-10-CM | POA: Diagnosis not present

## 2024-01-19 DIAGNOSIS — N2581 Secondary hyperparathyroidism of renal origin: Secondary | ICD-10-CM | POA: Diagnosis not present

## 2024-01-19 DIAGNOSIS — Z992 Dependence on renal dialysis: Secondary | ICD-10-CM | POA: Diagnosis not present

## 2024-01-19 DIAGNOSIS — E1129 Type 2 diabetes mellitus with other diabetic kidney complication: Secondary | ICD-10-CM | POA: Diagnosis not present

## 2024-01-19 DIAGNOSIS — R519 Headache, unspecified: Secondary | ICD-10-CM | POA: Diagnosis not present

## 2024-01-19 DIAGNOSIS — N186 End stage renal disease: Secondary | ICD-10-CM | POA: Diagnosis not present

## 2024-01-21 ENCOUNTER — Encounter (HOSPITAL_COMMUNITY): Payer: Self-pay | Admitting: Internal Medicine

## 2024-01-22 DIAGNOSIS — N186 End stage renal disease: Secondary | ICD-10-CM | POA: Diagnosis not present

## 2024-01-22 DIAGNOSIS — D688 Other specified coagulation defects: Secondary | ICD-10-CM | POA: Diagnosis not present

## 2024-01-22 DIAGNOSIS — D631 Anemia in chronic kidney disease: Secondary | ICD-10-CM | POA: Diagnosis not present

## 2024-01-22 DIAGNOSIS — E1129 Type 2 diabetes mellitus with other diabetic kidney complication: Secondary | ICD-10-CM | POA: Diagnosis not present

## 2024-01-22 DIAGNOSIS — R519 Headache, unspecified: Secondary | ICD-10-CM | POA: Diagnosis not present

## 2024-01-22 DIAGNOSIS — Z992 Dependence on renal dialysis: Secondary | ICD-10-CM | POA: Diagnosis not present

## 2024-01-22 DIAGNOSIS — N2581 Secondary hyperparathyroidism of renal origin: Secondary | ICD-10-CM | POA: Diagnosis not present

## 2024-01-24 DIAGNOSIS — E1129 Type 2 diabetes mellitus with other diabetic kidney complication: Secondary | ICD-10-CM | POA: Diagnosis not present

## 2024-01-24 DIAGNOSIS — D631 Anemia in chronic kidney disease: Secondary | ICD-10-CM | POA: Diagnosis not present

## 2024-01-24 DIAGNOSIS — N2581 Secondary hyperparathyroidism of renal origin: Secondary | ICD-10-CM | POA: Diagnosis not present

## 2024-01-24 DIAGNOSIS — R519 Headache, unspecified: Secondary | ICD-10-CM | POA: Diagnosis not present

## 2024-01-24 DIAGNOSIS — D688 Other specified coagulation defects: Secondary | ICD-10-CM | POA: Diagnosis not present

## 2024-01-24 DIAGNOSIS — Z992 Dependence on renal dialysis: Secondary | ICD-10-CM | POA: Diagnosis not present

## 2024-01-24 DIAGNOSIS — N186 End stage renal disease: Secondary | ICD-10-CM | POA: Diagnosis not present

## 2024-01-25 DIAGNOSIS — N186 End stage renal disease: Secondary | ICD-10-CM | POA: Diagnosis not present

## 2024-01-25 DIAGNOSIS — Z992 Dependence on renal dialysis: Secondary | ICD-10-CM | POA: Diagnosis not present

## 2024-01-25 DIAGNOSIS — E1122 Type 2 diabetes mellitus with diabetic chronic kidney disease: Secondary | ICD-10-CM | POA: Diagnosis not present

## 2024-01-26 DIAGNOSIS — Z992 Dependence on renal dialysis: Secondary | ICD-10-CM | POA: Diagnosis not present

## 2024-01-26 DIAGNOSIS — E1129 Type 2 diabetes mellitus with other diabetic kidney complication: Secondary | ICD-10-CM | POA: Diagnosis not present

## 2024-01-26 DIAGNOSIS — D631 Anemia in chronic kidney disease: Secondary | ICD-10-CM | POA: Diagnosis not present

## 2024-01-26 DIAGNOSIS — D688 Other specified coagulation defects: Secondary | ICD-10-CM | POA: Diagnosis not present

## 2024-01-26 DIAGNOSIS — R519 Headache, unspecified: Secondary | ICD-10-CM | POA: Diagnosis not present

## 2024-01-26 DIAGNOSIS — N186 End stage renal disease: Secondary | ICD-10-CM | POA: Diagnosis not present

## 2024-01-26 DIAGNOSIS — I10 Essential (primary) hypertension: Secondary | ICD-10-CM | POA: Diagnosis not present

## 2024-01-26 DIAGNOSIS — N2581 Secondary hyperparathyroidism of renal origin: Secondary | ICD-10-CM | POA: Diagnosis not present

## 2024-01-28 ENCOUNTER — Encounter: Payer: Self-pay | Admitting: "Endocrinology

## 2024-01-28 ENCOUNTER — Ambulatory Visit (INDEPENDENT_AMBULATORY_CARE_PROVIDER_SITE_OTHER): Payer: Medicare Other | Admitting: "Endocrinology

## 2024-01-28 VITALS — BP 158/86 | HR 101 | Ht 60.0 in | Wt 201.0 lb

## 2024-01-28 DIAGNOSIS — E7800 Pure hypercholesterolemia, unspecified: Secondary | ICD-10-CM | POA: Diagnosis not present

## 2024-01-28 DIAGNOSIS — Z794 Long term (current) use of insulin: Secondary | ICD-10-CM

## 2024-01-28 DIAGNOSIS — E1165 Type 2 diabetes mellitus with hyperglycemia: Secondary | ICD-10-CM | POA: Diagnosis not present

## 2024-01-28 LAB — POCT GLYCOSYLATED HEMOGLOBIN (HGB A1C): Hemoglobin A1C: 6.9 % — AB (ref 4.0–5.6)

## 2024-01-28 NOTE — Progress Notes (Signed)
Outpatient Endocrinology Note Altamese [place], MD  01/28/24   Katie Garcia 1941/12/14 [id number]  Referring Provider: Lorenda Ishihara,* Primary Care Provider: Lorenda Ishihara, MD Reason for consultation: Subjective   Assessment & Plan  Diagnoses and all orders for this visit:  Type 2 diabetes mellitus with hyperglycemia, with long-term current use of insulin (HCC) -     POCT glycosylated hemoglobin (Hb A1C)  Pure hypercholesterolemia   Diabetes complicated by neuropathy, ESRD on HD Hba1c goal less than 7.0, current Hba1c is 6.9. Will recommend for the following change of medications to: 20 units Lantus in the afternoon, 22 units Novolin R insulin 15-30 min before break fast and supper  Cannot afford Humalog/novolog  Pt sometimes forgets to eat meal and sleeps, has 2 daughter to help remind Reinforced compliance  Has baqsimi  No known contraindications to any of above medications  Hyperlipidemia -Last LDL near goal: 80 -not taking rosuvastatin 10 mg every day, use limited due to HD -Follow low fat diet and exercise   -Blood pressure goal <140/90 - Microalbumin/creatinine goal < 30 -not on ACE/ARB -ESRD on HD, deferred to nephrology -diet changes including salt restriction -limit eating outside -counseled BP targets per standards of diabetes care -Uncontrolled blood pressure can lead to retinopathy, nephropathy and cardiovascular and atherosclerotic heart disease  Reviewed and counseled on: -A1C target -Blood sugar targets -Complications of uncontrolled diabetes  -Checking blood sugar before meals and bedtime and bring log next visit -All medications with mechanism of action and side effects -Hypoglycemia management: rule of 15's, Glucagon Emergency Kit and medical alert ID -low-carb low-fat plate-method diet -At least 20 minutes of physical activity per day -Annual dilated retinal eye exam and foot exam -compliance and follow up  needs -follow up as scheduled or earlier if problem gets worse  Call if blood sugar is less than 70 or consistently above 250    Take a 15 gm snack of carbohydrate at bedtime before you go to sleep if your blood sugar is less than 100.    If you are going to fast after midnight for a test or procedure, ask your physician for instructions on how to reduce/decrease your insulin dose.    Call if blood sugar is less than 70 or consistently above 250  -Treating a low sugar by rule of 15  (15 gms of sugar every 15 min until sugar is more than 70) If you feel your sugar is low, test your sugar to be sure If your sugar is low (less than 70), then take 15 grams of a fast acting Carbohydrate (3-4 glucose tablets or glucose gel or 4 ounces of juice or regular soda) Recheck your sugar 15 min after treating low to make sure it is more than 70 If sugar is still less than 70, treat again with 15 grams of carbohydrate          Don't drive the hour of hypoglycemia  If unconscious/unable to eat or drink by mouth, use glucagon injection or nasal spray baqsimi and call 911. Can repeat again in 15 min if still unconscious.  Return in about 6 weeks (around 03/10/2024).   I have reviewed current medications, nurse's notes, allergies, vital signs, past medical and surgical history, family medical history, and social history for this encounter. Counseled patient on symptoms, examination findings, lab findings, imaging results, treatment decisions and monitoring and prognosis. The patient understood the recommendations and agrees with the treatment plan. All questions regarding treatment plan were fully answered.  Altamese [place], MD  01/28/24    History of Present Illness Katie Garcia is a 83 y.o. year old female who presents for follow up of Type 2 diabetes mellitus.  Katie Garcia was first diagnosed in 1980.   Diabetes education + Has hypoglycemia awareness around 70, DexCom beeps  Home diabetes  regimen: 0-20 units Lantus in the afternoon, 20 Novolin R insulin before break fast     Care is limited by compliance  Previous history:  Non-insulin hypoglycemic drugs previously used: Unknown Insulin was started soon after diagnosis, previously had been on Levemir, Tresiba, Lantus and NovoLog  COMPLICATIONS -  MI/Stroke -  retinopathy, last eye exam 2024 +  neuropathy +  nephropathy, ESRD on HD  BLOOD SUGAR DATA  CGM interpretation: At today's visit, we reviewed her CGM downloads. The full report is scanned in the media. Reviewing the CGM trends, BG can be normal-elevated across the day, specially elevated after mid day meal.  Physical Exam  BP (!) 158/86 (Cuff Size: Normal)   Pulse (!) 101   Ht 5' (1.524 m)   Wt 201 lb (91.2 kg)   SpO2 98%   BMI 39.26 kg/m    Constitutional: well developed, well nourished Head: normocephalic, atraumatic Eyes: sclera anicteric, no redness Neck: supple Lungs: normal respiratory effort Neurology: alert and oriented Skin: dry, no appreciable rashes Musculoskeletal: no appreciable defects Psychiatric: normal mood and affect Diabetic Foot Exam - Simple   No data filed      Current Medications Patient's Medications  New Prescriptions   No medications on file  Previous Medications   ACCU-CHEK FASTCLIX LANCETS MISC       ACCU-CHEK GUIDE TEST STRIP    1 each by Other route in the morning and at bedtime. E11.22   ACETAMINOPHEN (TYLENOL) 500 MG TABLET    Take 500-1,000 mg by mouth every 6 (six) hours as needed for moderate pain or headache.   ALLOPURINOL (ZYLOPRIM) 100 MG TABLET    Take 100 mg by mouth Every Tuesday,Thursday,and Saturday with dialysis. After hemodialysis   B COMPLEX-C-ZN-FOLIC ACID (DIALYVITE 800-ZINC 15) 0.8 MG TABS    Take 1 tablet by mouth daily with supper.   BLOOD GLUCOSE MONITORING SUPPL (ACCU-CHEK GUIDE) W/DEVICE KIT       CALCIUM ACETATE (PHOSLO) 667 MG CAPSULE    Take 1,334 mg by mouth 3 (three) times daily with  meals.   CARVEDILOL (COREG) 25 MG TABLET    Take 25 mg by mouth in the morning and at bedtime.    CONTINUOUS GLUCOSE RECEIVER (DEXCOM G7 RECEIVER) DEVI    Use to monitor blood sugar.   CONTINUOUS GLUCOSE SENSOR (DEXCOM G7 SENSOR) MISC    Change every 10 days   FAMOTIDINE (PEPCID) 20 MG TABLET    Take 20 mg by mouth daily.   GUAIFENESIN (ROBITUSSIN) 100 MG/5ML SYRUP    Take 100 mg by mouth 3 (three) times daily as needed for cough.   INSULIN GLARGINE (LANTUS SOLOSTAR) 100 UNIT/ML SOLOSTAR PEN    INJECT 30 UNITS INTO THE SKIN DAILY. ADJUST AS DIRECTED   INSULIN LISPRO (HUMALOG KWIKPEN) 100 UNIT/ML KWIKPEN    18 units acbf   INSULIN PEN NEEDLE 31G X 5 MM MISC    Use for insulin   INSULIN REGULAR (NOVOLIN R) 100 UNITS/ML INJECTION    25 Units.   LORATADINE (CLARITIN) 10 MG TABLET    Take 10 mg by mouth daily.   MECLIZINE (ANTIVERT) 25 MG TABLET  Take by mouth.   RELION INSULIN SYR 0.5ML/31G 31G X 5/16" 0.5 ML MISC    USE 4 times DAILY   ROSUVASTATIN (CRESTOR) 10 MG TABLET    Take 10 mg by mouth in the morning.   SYMBICORT 160-4.5 MCG/ACT INHALER    Inhale 2 puffs into the lungs 2 (two) times daily.  Modified Medications   No medications on file  Discontinued Medications   No medications on file    Allergies Allergies  Allergen Reactions   Penicillins Rash    Did it involve swelling of the face/tongue/throat, SOB, or low BP? No Did it involve sudden or severe rash/hives, skin peeling, or any reaction on the inside of your mouth or nose? Yes Did you need to seek medical attention at a hospital or doctor's office? N/A When did it last happen? N/A      If all above answers are "NO", may proceed with cephalosporin use.    Past Medical History Past Medical History:  Diagnosis Date   Anemia    Arthritis    COVID    hospitalized   Diabetes mellitus without complication (HCC)    ESRD    Dialysis T/Th/Sa at [address]   GERD (gastroesophageal reflux disease)    Gout    Headache     Hypertension    Type 2 diabetes mellitus [facility])     Past Surgical History Past Surgical History:  Procedure Laterality Date   A/V FISTULAGRAM Left 04/05/2022   Procedure: A/V Fistulagram;  Surgeon: Victorino Sparrow, MD;  Location: [facility] INVASIVE CV LAB;  Service: Cardiovascular;  Laterality: Left;   ABDOMINAL HYSTERECTOMY     AV FISTULA PLACEMENT Left 10/02/2016   Procedure: ARTERIOVENOUS (AV) FISTULA CREATION LEFT UPPER ARM;  Surgeon: Maeola Harman, MD;  Location: [facility] OR;  Service: Vascular;  Laterality: Left;   AV FISTULA PLACEMENT Right 07/14/2020   Procedure: RIGHT ARM ARTERIOVENOUS FISTULA CREATION;  Surgeon: Maeola Harman, MD;  Location: [facility] OR;  Service: Vascular;  Laterality: Right;   AV FISTULA PLACEMENT Left 05/16/2021   Procedure: INSERTION OF LEFT UPPER ARM ARTERIOVENOUS (AV) GORE-TEX GRAFT;  Surgeon: Cephus Shelling, MD;  Location: [facility] OR;  Service: Vascular;  Laterality: Left;   AV FISTULA PLACEMENT Right 07/13/2021   Procedure: RIGHT ARM ARTERIOVENOUS (AV) FISTULA GRAFT INSERTION;  Surgeon: Maeola Harman, MD;  Location: [facility] OR;  Service: Vascular;  Laterality: Right;   AV FISTULA PLACEMENT Left 02/03/2022   Procedure: LEFT UPPER ARM ARTERIOVENOUS FISTULA CREATION;  Surgeon: Maeola Harman, MD;  Location: [facility] OR;  Service: Vascular;  Laterality: Left;   AV FISTULA PLACEMENT Left 06/23/2022   Procedure: INSERTION OF LEFT ARM ARTERIOVENOUS (AV) GORE-TEX GRAFT;  Surgeon: Maeola Harman, MD;  Location: [facility] OR;  Service: Vascular;  Laterality: Left;   BASCILIC VEIN TRANSPOSITION Left 05/17/2020   Procedure: Insertion of Left arm arteriovenous gortex graftarm ;  Surgeon: Chuck Hint, MD;  Location: [facility] OR;  Service: Vascular;  Laterality: Left;   CESAREAN SECTION     DIALYSIS/PERMA CATHETER INSERTION     DIALYSIS/PERMA CATHETER INSERTION  12/10/2023   Procedure: DIALYSIS/PERMA CATHETER INSERTION;  Surgeon: Daria Pastures, MD;   Location: [facility] INVASIVE CV LAB;  Service: Cardiovascular;;   DIALYSIS/PERMA CATHETER INSERTION N/A 01/18/2024   Procedure: DIALYSIS/PERMA CATHETER INSERTION;  Surgeon: Tyler Pita, MD;  Location: [facility] INVASIVE CV LAB;  Service: Cardiovascular;  Laterality: N/A;   IR FLUORO GUIDE CV LINE LEFT  02/09/2022  IR PTA VENOUS EXCEPT DIALYSIS CIRCUIT  02/09/2022   IR VENOCAVAGRAM SVC  02/09/2022   PERIPHERAL VASCULAR THROMBECTOMY N/A 12/10/2023   Procedure: PERIPHERAL VASCULAR THROMBECTOMY;  Surgeon: Daria Pastures, MD;  Location: [facility] INVASIVE CV LAB;  Service: Cardiovascular;  Laterality: N/A;   REVISON OF ARTERIOVENOUS FISTULA Right 09/15/2020   Procedure: REVISON OF RIGHT UPPER ARM  ARTERIOVENOUS FISTULA;  Surgeon: Maeola Harman, MD;  Location: [facility] OR;  Service: Vascular;  Laterality: Right;   UPPER EXTREMITY VENOGRAPHY Bilateral 04/18/2021   Procedure: UPPER EXTREMITY VENOGRAPHY;  Surgeon: Maeola Harman, MD;  Location: [facility] INVASIVE CV LAB;  Service: Cardiovascular;  Laterality: Bilateral;   UPPER EXTREMITY VENOGRAPHY Left 01/23/2022   Procedure: UPPER EXTREMITY VENOGRAPHY;  Surgeon: Maeola Harman, MD;  Location: [facility] INVASIVE CV LAB;  Service: Cardiovascular;  Laterality: Left;    Family History family history includes Diabetes in her mother.  Social History Social History   Socioeconomic History   Marital status: Married    Spouse name: Not on file   Number of children: Not on file   Years of education: Not on file   Highest education level: Not on file  Occupational History   Not on file  Tobacco Use   Smoking status: Never   Smokeless tobacco: Never  Vaping Use   Vaping status: Never Used  Substance and Sexual Activity   Alcohol use: Not Currently   Drug use: No   Sexual activity: Not on file  Other Topics Concern   Not on file  Social History Narrative   Not on file   Social Drivers of Health   Financial Resource Strain: Not on file  Food  Insecurity: No Food Insecurity (07/19/2022)   Hunger Vital Sign    Worried About Running Out of Food in the Last Year: Never true    Ran Out of Food in the Last Year: Never true  Transportation Needs: No Transportation Needs (07/19/2022)   PRAPARE - Administrator, Civil Service (Medical): No    Lack of Transportation (Non-Medical): No  Physical Activity: Not on file  Stress: Not on file  Social Connections: Not on file  Intimate Partner Violence: Not on file    Lab Results  Component Value Date   HGBA1C 6.9 (A) 01/28/2024   Lab Results  Component Value Date   CHOL 161 10/29/2023   Lab Results  Component Value Date   HDL 60.70 10/29/2023   Lab Results  Component Value Date   LDLCALC 80 10/29/2023   Lab Results  Component Value Date   TRIG 102.0 10/29/2023   Lab Results  Component Value Date   CHOLHDL 3 10/29/2023   Lab Results  Component Value Date   CREATININE 6.72 (HH) 06/22/2023   Lab Results  Component Value Date   GFR 5.42 (LL) 06/22/2023   Lab Results  Component Value Date   MICROALBUR 87.6 (H) 06/22/2023      Component Value Date/Time   NA 143 06/22/2023 0844   K 4.3 06/22/2023 0844   CL 97 06/22/2023 0844   CO2 30 06/22/2023 0844   GLUCOSE 172 (H) 06/22/2023 0844   BUN 30 (H) 06/22/2023 0844   CREATININE 6.72 (HH) 06/22/2023 0844   CALCIUM 10.1 06/22/2023 0844   CALCIUM 7.3 (L) 09/17/2018 0941   PROT 7.0 06/22/2023 0844   ALBUMIN 3.8 06/22/2023 0844   AST 23 06/22/2023 0844   ALT 14 06/22/2023 0844   ALKPHOS 91 06/22/2023 0844   BILITOT  0.4 06/22/2023 0844   GFRNONAA 9 (L) 10/13/2021 1231   GFRAA 5 (L) 12/20/2019 0500      Latest Ref Rng & Units 06/22/2023    8:44 AM 02/02/2023    9:13 AM 10/18/2022   10:06 AM  BMP  Glucose 70 - 99 mg/dL 956  213  086   BUN 6 - 23 mg/dL 30     Creatinine 5.78 - 1.20 mg/dL 4.69     Sodium 629 - 528 mEq/L 143     Potassium 3.5 - 5.1 mEq/L 4.3     Chloride 96 - 112 mEq/L 97     CO2 19 - 32  mEq/L 30     Calcium 8.4 - 10.5 mg/dL 41.3          Component Value Date/Time   WBC 7.0 10/13/2021 1231   RBC 4.58 10/13/2021 1231   HGB 13.6 06/23/2022 0552   HCT 40.0 06/23/2022 0552   PLT 126 (L) 10/13/2021 1231   MCV 92.1 10/13/2021 1231   MCH 27.9 10/13/2021 1231   MCHC 30.3 10/13/2021 1231   RDW 17.0 (H) 10/13/2021 1231   LYMPHSABS 1.6 10/13/2021 1231   MONOABS 0.6 10/13/2021 1231   EOSABS 0.0 10/13/2021 1231   BASOSABS 0.1 10/13/2021 1231     Parts of this note may have been dictated using voice recognition software. There may be variances in spelling and vocabulary which are unintentional. Not all errors are proofread. Please notify the Thereasa Parkin if any discrepancies are noted or if the meaning of any statement is not clear.

## 2024-01-28 NOTE — Patient Instructions (Addendum)
Lantus pen 20 units once a day Novolin R 22 units 20-30 min before meal Hold insulin if blood sugar is less than 70  _____________ Goals of DM therapy:  Morning Fasting blood sugar: 80-140  Blood sugar before meals: 80-140 Bed time blood sugar: 100-150  A1C <7%, limited only by hypoglycemia  1.Diabetes medications and their side effects discussed, including hypoglycemia    2. Check blood glucose:  a) Always check blood sugars before driving. Please see below (under hypoglycemia) on how to manage b) Check a minimum of 3 times/day or more as needed when having symptoms of hypoglycemia.   c) Try to check blood glucose before sleeping/in the middle of the night to ensure that it is remaining stable and not dropping less than 100 d) Check blood glucose more often if sick  3. Diet: a) 3 meals per day schedule b: Restrict carbs to 60-70 grams (4 servings) per meal c) Colorful vegetables - 3 servings a day, and low sugar fruit 2 servings/day Plate control method: 1/4 plate protein, 1/4 starch, 1/2 green, yellow, or red vegetables d) Avoid carbohydrate snacks unless hypoglycemic episode, or increased physical activity  4. Regular exercise as tolerated, preferably 3 or more hours a week  5. Hypoglycemia: a)  Do not drive or operate machinery without first testing blood glucose to assure it is over 90 mg%, or if dizzy, lightheaded, not feeling normal, etc, or  if foot or leg is numb or weak. b)  If blood glucose less than 70, take four 5gm Glucose tabs or 15-30 gm Glucose gel.  Repeat every 15 min as needed until blood sugar is >100 mg/dl. If hypoglycemia persists then call 911.   6. Sick day management: a) Check blood glucose more often b) Continue usual therapy if blood sugars are elevated.   7. Contact the doctor immediately if blood glucose is frequently <60 mg/dl, or an episode of severe hypoglycemia occurs (where someone had to give you glucose/  glucagon or if you passed out from  a low blood glucose), or if blood glucose is persistently >350 mg/dl, for further management  8. A change in level of physical activity or exercise and a change in diet may also affect your blood sugar. Check blood sugars more often and call if needed.  Instructions: 1. Bring glucose meter, blood glucose records on every visit for review 2. Continue to follow up with primary care physician and other providers for medical care 3. Yearly eye  and foot exam 4. Please get blood work done prior to the next appointment

## 2024-01-29 DIAGNOSIS — D688 Other specified coagulation defects: Secondary | ICD-10-CM | POA: Diagnosis not present

## 2024-01-29 DIAGNOSIS — I10 Essential (primary) hypertension: Secondary | ICD-10-CM | POA: Diagnosis not present

## 2024-01-29 DIAGNOSIS — N186 End stage renal disease: Secondary | ICD-10-CM | POA: Diagnosis not present

## 2024-01-29 DIAGNOSIS — E1129 Type 2 diabetes mellitus with other diabetic kidney complication: Secondary | ICD-10-CM | POA: Diagnosis not present

## 2024-01-29 DIAGNOSIS — R519 Headache, unspecified: Secondary | ICD-10-CM | POA: Diagnosis not present

## 2024-01-29 DIAGNOSIS — N2581 Secondary hyperparathyroidism of renal origin: Secondary | ICD-10-CM | POA: Diagnosis not present

## 2024-01-29 DIAGNOSIS — D631 Anemia in chronic kidney disease: Secondary | ICD-10-CM | POA: Diagnosis not present

## 2024-01-29 DIAGNOSIS — Z992 Dependence on renal dialysis: Secondary | ICD-10-CM | POA: Diagnosis not present

## 2024-01-30 ENCOUNTER — Encounter: Payer: Self-pay | Admitting: Podiatry

## 2024-01-30 ENCOUNTER — Ambulatory Visit (INDEPENDENT_AMBULATORY_CARE_PROVIDER_SITE_OTHER): Payer: Medicare Other | Admitting: Podiatry

## 2024-01-30 DIAGNOSIS — M79674 Pain in right toe(s): Secondary | ICD-10-CM

## 2024-01-30 DIAGNOSIS — B351 Tinea unguium: Secondary | ICD-10-CM | POA: Diagnosis not present

## 2024-01-30 DIAGNOSIS — N186 End stage renal disease: Secondary | ICD-10-CM

## 2024-01-30 DIAGNOSIS — Z992 Dependence on renal dialysis: Secondary | ICD-10-CM | POA: Diagnosis not present

## 2024-01-30 DIAGNOSIS — E0822 Diabetes mellitus due to underlying condition with diabetic chronic kidney disease: Secondary | ICD-10-CM

## 2024-01-30 DIAGNOSIS — M79675 Pain in left toe(s): Secondary | ICD-10-CM | POA: Diagnosis not present

## 2024-01-30 DIAGNOSIS — Z794 Long term (current) use of insulin: Secondary | ICD-10-CM | POA: Diagnosis not present

## 2024-01-30 NOTE — Progress Notes (Signed)
 This patient returns to my office for at risk foot care.  This patient requires this care by a professional since this patient will be at risk due to having diabetes with peripheral angiopathy.  This patient is unable to cut nails herself since the patient cannot reach her nails.These nails are painful walking and wearing shoes.  This patient presents for at risk foot care today.  General Appearance  Alert, conversant and in no acute stress.  Vascular  Dorsalis pedis and posterior tibial  pulses are  weakly palpable  bilaterally.  Capillary return is within normal limits  bilaterally. Temperature is within normal limits  bilaterally.  Neurologic  Senn-Weinstein monofilament wire test within normal limits  bilaterally. Muscle power within normal limits bilaterally.  Nails Thick disfigured discolored nails with subungual debris  from hallux to fifth toes bilaterally. No evidence of bacterial infection or drainage bilaterally. Pincer hallux nails.  Orthopedic  No limitations of motion  feet .  No crepitus or effusions noted.  No bony pathology or digital deformities noted.  Skin  normotropic skin with no porokeratosis noted bilaterally.  No signs of infections or ulcers noted.     Onychomycosis  Pain in right toes  Pain in left toes  Consent was obtained for treatment procedures.   Mechanical debridement of nails 1-5  bilaterally performed with a nail nipper.  Filed with dremel without incident.    Return office visit    3 months                  Told patient to return for periodic foot care and evaluation due to potential at risk complications.   Cordella Bold DPM

## 2024-01-31 DIAGNOSIS — D631 Anemia in chronic kidney disease: Secondary | ICD-10-CM | POA: Diagnosis not present

## 2024-01-31 DIAGNOSIS — I10 Essential (primary) hypertension: Secondary | ICD-10-CM | POA: Diagnosis not present

## 2024-01-31 DIAGNOSIS — R519 Headache, unspecified: Secondary | ICD-10-CM | POA: Diagnosis not present

## 2024-01-31 DIAGNOSIS — N186 End stage renal disease: Secondary | ICD-10-CM | POA: Diagnosis not present

## 2024-01-31 DIAGNOSIS — E1129 Type 2 diabetes mellitus with other diabetic kidney complication: Secondary | ICD-10-CM | POA: Diagnosis not present

## 2024-01-31 DIAGNOSIS — N2581 Secondary hyperparathyroidism of renal origin: Secondary | ICD-10-CM | POA: Diagnosis not present

## 2024-01-31 DIAGNOSIS — Z992 Dependence on renal dialysis: Secondary | ICD-10-CM | POA: Diagnosis not present

## 2024-01-31 DIAGNOSIS — D688 Other specified coagulation defects: Secondary | ICD-10-CM | POA: Diagnosis not present

## 2024-02-02 DIAGNOSIS — N186 End stage renal disease: Secondary | ICD-10-CM | POA: Diagnosis not present

## 2024-02-02 DIAGNOSIS — D688 Other specified coagulation defects: Secondary | ICD-10-CM | POA: Diagnosis not present

## 2024-02-02 DIAGNOSIS — Z992 Dependence on renal dialysis: Secondary | ICD-10-CM | POA: Diagnosis not present

## 2024-02-02 DIAGNOSIS — R519 Headache, unspecified: Secondary | ICD-10-CM | POA: Diagnosis not present

## 2024-02-02 DIAGNOSIS — D631 Anemia in chronic kidney disease: Secondary | ICD-10-CM | POA: Diagnosis not present

## 2024-02-02 DIAGNOSIS — E1129 Type 2 diabetes mellitus with other diabetic kidney complication: Secondary | ICD-10-CM | POA: Diagnosis not present

## 2024-02-02 DIAGNOSIS — I10 Essential (primary) hypertension: Secondary | ICD-10-CM | POA: Diagnosis not present

## 2024-02-02 DIAGNOSIS — N2581 Secondary hyperparathyroidism of renal origin: Secondary | ICD-10-CM | POA: Diagnosis not present

## 2024-02-05 DIAGNOSIS — D631 Anemia in chronic kidney disease: Secondary | ICD-10-CM | POA: Diagnosis not present

## 2024-02-05 DIAGNOSIS — N186 End stage renal disease: Secondary | ICD-10-CM | POA: Diagnosis not present

## 2024-02-05 DIAGNOSIS — D688 Other specified coagulation defects: Secondary | ICD-10-CM | POA: Diagnosis not present

## 2024-02-05 DIAGNOSIS — E1129 Type 2 diabetes mellitus with other diabetic kidney complication: Secondary | ICD-10-CM | POA: Diagnosis not present

## 2024-02-05 DIAGNOSIS — R519 Headache, unspecified: Secondary | ICD-10-CM | POA: Diagnosis not present

## 2024-02-05 DIAGNOSIS — I10 Essential (primary) hypertension: Secondary | ICD-10-CM | POA: Diagnosis not present

## 2024-02-05 DIAGNOSIS — Z992 Dependence on renal dialysis: Secondary | ICD-10-CM | POA: Diagnosis not present

## 2024-02-05 DIAGNOSIS — N2581 Secondary hyperparathyroidism of renal origin: Secondary | ICD-10-CM | POA: Diagnosis not present

## 2024-02-06 ENCOUNTER — Ambulatory Visit: Payer: Medicare Other | Admitting: Vascular Surgery

## 2024-02-06 ENCOUNTER — Encounter: Payer: Self-pay | Admitting: Vascular Surgery

## 2024-02-06 VITALS — BP 156/84 | HR 86 | Temp 98.0°F | Resp 20 | Ht 60.0 in | Wt 197.0 lb

## 2024-02-06 DIAGNOSIS — N186 End stage renal disease: Secondary | ICD-10-CM | POA: Diagnosis not present

## 2024-02-06 NOTE — H&P (View-Only) (Signed)
 Patient ID: Katie Garcia, female   DOB: [birth year], 83 y.o.   MRN: [id number]  Reason for Consult: Follow-up   Referred by Estanislado Emms, MD  Subjective:     HPI:  Katie Garcia is a 83 y.o. female history of multiple previous bilateral upper extremity dialysis accesses.  Currently dialyzes Tuesdays, Thursdays and Saturdays most recently via left upper arm AV graft which is now thrombosed at the time of Christmas.  She has a left IJ tunneled dialysis catheter which she is using now but has had to be replaced once already.  She does not have any previous lower extremity interventions.  Past Medical History:  Diagnosis Date   Anemia    Arthritis    COVID    hospitalized   Diabetes mellitus without complication (HCC)    ESRD    Dialysis T/Th/Sa at [address]   GERD (gastroesophageal reflux disease)    Gout    Headache    Hypertension    Type 2 diabetes mellitus (HCC)    Family History  Problem Relation Age of Onset   Diabetes Mother    Past Surgical History:  Procedure Laterality Date   A/V FISTULAGRAM Left 04/05/2022   Procedure: A/V Fistulagram;  Surgeon: Victorino Sparrow, MD;  Location: [facility] INVASIVE CV LAB;  Service: Cardiovascular;  Laterality: Left;   ABDOMINAL HYSTERECTOMY     AV FISTULA PLACEMENT Left 10/02/2016   Procedure: ARTERIOVENOUS (AV) FISTULA CREATION LEFT UPPER ARM;  Surgeon: Maeola Harman, MD;  Location: [facility] OR;  Service: Vascular;  Laterality: Left;   AV FISTULA PLACEMENT Right 07/14/2020   Procedure: RIGHT ARM ARTERIOVENOUS FISTULA CREATION;  Surgeon: Maeola Harman, MD;  Location: [facility] OR;  Service: Vascular;  Laterality: Right;   AV FISTULA PLACEMENT Left 05/16/2021   Procedure: INSERTION OF LEFT UPPER ARM ARTERIOVENOUS (AV) GORE-TEX GRAFT;  Surgeon: Cephus Shelling, MD;  Location: [facility] OR;  Service: Vascular;  Laterality: Left;   AV FISTULA PLACEMENT Right 07/13/2021   Procedure: RIGHT ARM ARTERIOVENOUS (AV) FISTULA GRAFT INSERTION;   Surgeon: Maeola Harman, MD;  Location: [facility] OR;  Service: Vascular;  Laterality: Right;   AV FISTULA PLACEMENT Left 02/03/2022   Procedure: LEFT UPPER ARM ARTERIOVENOUS FISTULA CREATION;  Surgeon: Maeola Harman, MD;  Location: [facility] OR;  Service: Vascular;  Laterality: Left;   AV FISTULA PLACEMENT Left 06/23/2022   Procedure: INSERTION OF LEFT ARM ARTERIOVENOUS (AV) GORE-TEX GRAFT;  Surgeon: Maeola Harman, MD;  Location: [facility] OR;  Service: Vascular;  Laterality: Left;   BASCILIC VEIN TRANSPOSITION Left 05/17/2020   Procedure: Insertion of Left arm arteriovenous gortex graftarm ;  Surgeon: Chuck Hint, MD;  Location: [facility] OR;  Service: Vascular;  Laterality: Left;   CESAREAN SECTION     DIALYSIS/PERMA CATHETER INSERTION     DIALYSIS/PERMA CATHETER INSERTION  12/10/2023   Procedure: DIALYSIS/PERMA CATHETER INSERTION;  Surgeon: Daria Pastures, MD;  Location: [facility] INVASIVE CV LAB;  Service: Cardiovascular;;   DIALYSIS/PERMA CATHETER INSERTION N/A 01/18/2024   Procedure: DIALYSIS/PERMA CATHETER INSERTION;  Surgeon: Tyler Pita, MD;  Location: [facility] INVASIVE CV LAB;  Service: Cardiovascular;  Laterality: N/A;   IR FLUORO GUIDE CV LINE LEFT  02/09/2022   IR PTA VENOUS EXCEPT DIALYSIS CIRCUIT  02/09/2022   IR VENOCAVAGRAM SVC  02/09/2022   PERIPHERAL VASCULAR THROMBECTOMY N/A 12/10/2023   Procedure: PERIPHERAL VASCULAR THROMBECTOMY;  Surgeon: Daria Pastures, MD;  Location: [facility] INVASIVE CV LAB;  Service: Cardiovascular;  Laterality:  N/A;   REVISON OF ARTERIOVENOUS FISTULA Right 09/15/2020   Procedure: REVISON OF RIGHT UPPER ARM  ARTERIOVENOUS FISTULA;  Surgeon: Maeola Harman, MD;  Location: [facility] OR;  Service: Vascular;  Laterality: Right;   UPPER EXTREMITY VENOGRAPHY Bilateral 04/18/2021   Procedure: UPPER EXTREMITY VENOGRAPHY;  Surgeon: Maeola Harman, MD;  Location: [facility] INVASIVE CV LAB;  Service: Cardiovascular;  Laterality: Bilateral;   UPPER  EXTREMITY VENOGRAPHY Left 01/23/2022   Procedure: UPPER EXTREMITY VENOGRAPHY;  Surgeon: Maeola Harman, MD;  Location: [facility] INVASIVE CV LAB;  Service: Cardiovascular;  Laterality: Left;    Short Social History:  Social History   Tobacco Use   Smoking status: Never   Smokeless tobacco: Never  Substance Use Topics   Alcohol use: Not Currently    Allergies  Allergen Reactions   Penicillins Rash    Did it involve swelling of the face/tongue/throat, SOB, or low BP? No Did it involve sudden or severe rash/hives, skin peeling, or any reaction on the inside of your mouth or nose? Yes Did you need to seek medical attention at a hospital or doctor's office? N/A When did it last happen? N/A      If all above answers are "NO", may proceed with cephalosporin use.    Current Outpatient Medications  Medication Sig Dispense Refill   Accu-Chek FastClix Lancets MISC      ACCU-CHEK GUIDE test strip 1 each by Other route in the morning and at bedtime. E11.22  1   acetaminophen (TYLENOL) 500 MG tablet Take 500-1,000 mg by mouth every 6 (six) hours as needed for moderate pain or headache.     allopurinol (ZYLOPRIM) 100 MG tablet Take 100 mg by mouth Every Tuesday,Thursday,and Saturday with dialysis. After hemodialysis     B Complex-C-Zn-Folic Acid (DIALYVITE 800-ZINC 15) 0.8 MG TABS Take 1 tablet by mouth daily with supper.     Blood Glucose Monitoring Suppl (ACCU-CHEK GUIDE) w/Device KIT      calcium acetate (PHOSLO) 667 MG capsule Take 1,334 mg by mouth 3 (three) times daily with meals.  3   carvedilol (COREG) 25 MG tablet Take 25 mg by mouth in the morning and at bedtime.      Continuous Glucose Receiver (DEXCOM G7 RECEIVER) DEVI Use to monitor blood sugar. 1 each 0   Continuous Glucose Sensor (DEXCOM G7 SENSOR) MISC Change every 10 days 3 each 2   famotidine (PEPCID) 20 MG tablet Take 20 mg by mouth daily.     guaifenesin (ROBITUSSIN) 100 MG/5ML syrup Take 100 mg by mouth 3 (three) times  daily as needed for cough.     insulin glargine (LANTUS SOLOSTAR) 100 UNIT/ML Solostar Pen INJECT 30 UNITS INTO THE SKIN DAILY. ADJUST AS DIRECTED 45 mL 1   insulin lispro (HUMALOG KWIKPEN) 100 UNIT/ML KwikPen 18 units acbf 15 mL 1   Insulin Pen Needle 31G X 5 MM MISC Use for insulin 50 each 1   insulin regular (NOVOLIN R) 100 units/mL injection 25 Units.     loratadine (CLARITIN) 10 MG tablet Take 10 mg by mouth daily.     meclizine (ANTIVERT) 25 MG tablet Take by mouth.     RELION INSULIN SYR 0.5ML/31G 31G X 5/16" 0.5 ML MISC USE 4 times DAILY 100 each 2   rosuvastatin (CRESTOR) 10 MG tablet Take 10 mg by mouth in the morning.     SYMBICORT 160-4.5 MCG/ACT inhaler Inhale 2 puffs into the lungs 2 (two) times daily.     Current Facility-Administered  Medications  Medication Dose Route Frequency Provider Last Rate Last Admin   0.9 %  sodium chloride infusion  250 mL Intravenous PRN Maeola Harman, MD       sodium chloride flush (NS) 0.9 % injection 3 mL  3 mL Intravenous Q12H Maeola Harman, MD       sodium chloride flush (NS) 0.9 % injection 3 mL  3 mL Intravenous PRN Maeola Harman, MD        Review of Systems  Constitutional:  Constitutional negative. HENT: HENT negative.  Eyes: Eyes negative.  Respiratory: Respiratory negative.  Cardiovascular: Cardiovascular negative.  GI: Gastrointestinal negative.  Musculoskeletal: Musculoskeletal negative.  Skin: Skin negative.  Neurological: Neurological negative. Hematologic: Hematologic/lymphatic negative.  Psychiatric: Psychiatric negative.        Objective:  Objective   Vitals:   02/06/24 1421  BP: (!) 156/84  Pulse: 86  Resp: 20  Temp: 98 F (36.7 C)  SpO2: 98%  Weight: 197 lb (89.4 kg)  Height: 5' (1.524 m)   Body mass index is 38.47 kg/m.  Physical Exam HENT:     Mouth/Throat:     Mouth: Mucous membranes are moist.  Eyes:     Pupils: Pupils are equal, round, and reactive to light.   Cardiovascular:     Pulses:          Radial pulses are 2+ on the right side and 2+ on the left side.       Dorsalis pedis pulses are 2+ on the right side and 2+ on the left side.  Pulmonary:     Effort: Pulmonary effort is normal.  Abdominal:     General: Abdomen is flat.  Musculoskeletal:        General: Normal range of motion.     Right lower leg: No edema.     Left lower leg: No edema.  Skin:    General: Skin is warm.     Capillary Refill: Capillary refill takes less than 2 seconds.  Neurological:     General: No focal deficit present.     Mental Status: She is alert.  Psychiatric:        Mood and Affect: Mood normal.     Data: No studies     Assessment/Plan:     83 year old female with end-stage renal disease currently dialyzing via catheter.  Given her multiple previous bilateral upper extremity access procedures we will begin with venography from the bilateral upper extremities.  She did have this performed to the left upper extremity about 2 years ago prior to graft placement but this will certainly need to be updated prior to consideration of any further procedures.  We discussed that she is likely not going to be a candidate for standard graft or fistula in the upper extremities but possibly could be a candidate for a hero graft.  We would otherwise need to consider thigh access as a means to get past the catheter.  All questions were answered in the presence of her family they demonstrate good understanding.     Maeola Harman MD [facility]

## 2024-02-06 NOTE — Progress Notes (Signed)
Patient ID: Katie Garcia, female   DOB: [birth year], 83 y.o.   MRN: [id number]  Reason for Consult: Follow-up   Referred by Estanislado Emms, MD  Subjective:     HPI:  Katie Garcia is a 83 y.o. female history of multiple previous bilateral upper extremity dialysis accesses.  Currently dialyzes Tuesdays, Thursdays and Saturdays most recently via left upper arm AV graft which is now thrombosed at the time of Christmas.  She has a left IJ tunneled dialysis catheter which she is using now but has had to be replaced once already.  She does not have any previous lower extremity interventions.  Past Medical History:  Diagnosis Date   Anemia    Arthritis    COVID    hospitalized   Diabetes mellitus without complication (HCC)    ESRD    Dialysis T/Th/Sa at [address]   GERD (gastroesophageal reflux disease)    Gout    Headache    Hypertension    Type 2 diabetes mellitus (HCC)    Family History  Problem Relation Age of Onset   Diabetes Mother    Past Surgical History:  Procedure Laterality Date   A/V FISTULAGRAM Left 04/05/2022   Procedure: A/V Fistulagram;  Surgeon: Victorino Sparrow, MD;  Location: [facility] INVASIVE CV LAB;  Service: Cardiovascular;  Laterality: Left;   ABDOMINAL HYSTERECTOMY     AV FISTULA PLACEMENT Left 10/02/2016   Procedure: ARTERIOVENOUS (AV) FISTULA CREATION LEFT UPPER ARM;  Surgeon: Maeola Harman, MD;  Location: [facility] OR;  Service: Vascular;  Laterality: Left;   AV FISTULA PLACEMENT Right 07/14/2020   Procedure: RIGHT ARM ARTERIOVENOUS FISTULA CREATION;  Surgeon: Maeola Harman, MD;  Location: [facility] OR;  Service: Vascular;  Laterality: Right;   AV FISTULA PLACEMENT Left 05/16/2021   Procedure: INSERTION OF LEFT UPPER ARM ARTERIOVENOUS (AV) GORE-TEX GRAFT;  Surgeon: Cephus Shelling, MD;  Location: [facility] OR;  Service: Vascular;  Laterality: Left;   AV FISTULA PLACEMENT Right 07/13/2021   Procedure: RIGHT ARM ARTERIOVENOUS (AV) FISTULA GRAFT INSERTION;   Surgeon: Maeola Harman, MD;  Location: [facility] OR;  Service: Vascular;  Laterality: Right;   AV FISTULA PLACEMENT Left 02/03/2022   Procedure: LEFT UPPER ARM ARTERIOVENOUS FISTULA CREATION;  Surgeon: Maeola Harman, MD;  Location: [facility] OR;  Service: Vascular;  Laterality: Left;   AV FISTULA PLACEMENT Left 06/23/2022   Procedure: INSERTION OF LEFT ARM ARTERIOVENOUS (AV) GORE-TEX GRAFT;  Surgeon: Maeola Harman, MD;  Location: [facility] OR;  Service: Vascular;  Laterality: Left;   BASCILIC VEIN TRANSPOSITION Left 05/17/2020   Procedure: Insertion of Left arm arteriovenous gortex graftarm ;  Surgeon: Chuck Hint, MD;  Location: [facility] OR;  Service: Vascular;  Laterality: Left;   CESAREAN SECTION     DIALYSIS/PERMA CATHETER INSERTION     DIALYSIS/PERMA CATHETER INSERTION  12/10/2023   Procedure: DIALYSIS/PERMA CATHETER INSERTION;  Surgeon: Daria Pastures, MD;  Location: [facility] INVASIVE CV LAB;  Service: Cardiovascular;;   DIALYSIS/PERMA CATHETER INSERTION N/A 01/18/2024   Procedure: DIALYSIS/PERMA CATHETER INSERTION;  Surgeon: Tyler Pita, MD;  Location: [facility] INVASIVE CV LAB;  Service: Cardiovascular;  Laterality: N/A;   IR FLUORO GUIDE CV LINE LEFT  02/09/2022   IR PTA VENOUS EXCEPT DIALYSIS CIRCUIT  02/09/2022   IR VENOCAVAGRAM SVC  02/09/2022   PERIPHERAL VASCULAR THROMBECTOMY N/A 12/10/2023   Procedure: PERIPHERAL VASCULAR THROMBECTOMY;  Surgeon: Daria Pastures, MD;  Location: [facility] INVASIVE CV LAB;  Service: Cardiovascular;  Laterality:  N/A;   REVISON OF ARTERIOVENOUS FISTULA Right 09/15/2020   Procedure: REVISON OF RIGHT UPPER ARM  ARTERIOVENOUS FISTULA;  Surgeon: Maeola Harman, MD;  Location: [facility] OR;  Service: Vascular;  Laterality: Right;   UPPER EXTREMITY VENOGRAPHY Bilateral 04/18/2021   Procedure: UPPER EXTREMITY VENOGRAPHY;  Surgeon: Maeola Harman, MD;  Location: [facility] INVASIVE CV LAB;  Service: Cardiovascular;  Laterality: Bilateral;   UPPER  EXTREMITY VENOGRAPHY Left 01/23/2022   Procedure: UPPER EXTREMITY VENOGRAPHY;  Surgeon: Maeola Harman, MD;  Location: [facility] INVASIVE CV LAB;  Service: Cardiovascular;  Laterality: Left;    Short Social History:  Social History   Tobacco Use   Smoking status: Never   Smokeless tobacco: Never  Substance Use Topics   Alcohol use: Not Currently    Allergies  Allergen Reactions   Penicillins Rash    Did it involve swelling of the face/tongue/throat, SOB, or low BP? No Did it involve sudden or severe rash/hives, skin peeling, or any reaction on the inside of your mouth or nose? Yes Did you need to seek medical attention at a hospital or doctor's office? N/A When did it last happen? N/A      If all above answers are "NO", may proceed with cephalosporin use.    Current Outpatient Medications  Medication Sig Dispense Refill   Accu-Chek FastClix Lancets MISC      ACCU-CHEK GUIDE test strip 1 each by Other route in the morning and at bedtime. E11.22  1   acetaminophen (TYLENOL) 500 MG tablet Take 500-1,000 mg by mouth every 6 (six) hours as needed for moderate pain or headache.     allopurinol (ZYLOPRIM) 100 MG tablet Take 100 mg by mouth Every Tuesday,Thursday,and Saturday with dialysis. After hemodialysis     B Complex-C-Zn-Folic Acid (DIALYVITE 800-ZINC 15) 0.8 MG TABS Take 1 tablet by mouth daily with supper.     Blood Glucose Monitoring Suppl (ACCU-CHEK GUIDE) w/Device KIT      calcium acetate (PHOSLO) 667 MG capsule Take 1,334 mg by mouth 3 (three) times daily with meals.  3   carvedilol (COREG) 25 MG tablet Take 25 mg by mouth in the morning and at bedtime.      Continuous Glucose Receiver (DEXCOM G7 RECEIVER) DEVI Use to monitor blood sugar. 1 each 0   Continuous Glucose Sensor (DEXCOM G7 SENSOR) MISC Change every 10 days 3 each 2   famotidine (PEPCID) 20 MG tablet Take 20 mg by mouth daily.     guaifenesin (ROBITUSSIN) 100 MG/5ML syrup Take 100 mg by mouth 3 (three) times  daily as needed for cough.     insulin glargine (LANTUS SOLOSTAR) 100 UNIT/ML Solostar Pen INJECT 30 UNITS INTO THE SKIN DAILY. ADJUST AS DIRECTED 45 mL 1   insulin lispro (HUMALOG KWIKPEN) 100 UNIT/ML KwikPen 18 units acbf 15 mL 1   Insulin Pen Needle 31G X 5 MM MISC Use for insulin 50 each 1   insulin regular (NOVOLIN R) 100 units/mL injection 25 Units.     loratadine (CLARITIN) 10 MG tablet Take 10 mg by mouth daily.     meclizine (ANTIVERT) 25 MG tablet Take by mouth.     RELION INSULIN SYR 0.5ML/31G 31G X 5/16" 0.5 ML MISC USE 4 times DAILY 100 each 2   rosuvastatin (CRESTOR) 10 MG tablet Take 10 mg by mouth in the morning.     SYMBICORT 160-4.5 MCG/ACT inhaler Inhale 2 puffs into the lungs 2 (two) times daily.     Current Facility-Administered  Medications  Medication Dose Route Frequency Provider Last Rate Last Admin   0.9 %  sodium chloride infusion  250 mL Intravenous PRN Maeola Harman, MD       sodium chloride flush (NS) 0.9 % injection 3 mL  3 mL Intravenous Q12H Maeola Harman, MD       sodium chloride flush (NS) 0.9 % injection 3 mL  3 mL Intravenous PRN Maeola Harman, MD        Review of Systems  Constitutional:  Constitutional negative. HENT: HENT negative.  Eyes: Eyes negative.  Respiratory: Respiratory negative.  Cardiovascular: Cardiovascular negative.  GI: Gastrointestinal negative.  Musculoskeletal: Musculoskeletal negative.  Skin: Skin negative.  Neurological: Neurological negative. Hematologic: Hematologic/lymphatic negative.  Psychiatric: Psychiatric negative.        Objective:  Objective   Vitals:   02/06/24 1421  BP: (!) 156/84  Pulse: 86  Resp: 20  Temp: 98 F (36.7 C)  SpO2: 98%  Weight: 197 lb (89.4 kg)  Height: 5' (1.524 m)   Body mass index is 38.47 kg/m.  Physical Exam HENT:     Mouth/Throat:     Mouth: Mucous membranes are moist.  Eyes:     Pupils: Pupils are equal, round, and reactive to light.   Cardiovascular:     Pulses:          Radial pulses are 2+ on the right side and 2+ on the left side.       Dorsalis pedis pulses are 2+ on the right side and 2+ on the left side.  Pulmonary:     Effort: Pulmonary effort is normal.  Abdominal:     General: Abdomen is flat.  Musculoskeletal:        General: Normal range of motion.     Right lower leg: No edema.     Left lower leg: No edema.  Skin:    General: Skin is warm.     Capillary Refill: Capillary refill takes less than 2 seconds.  Neurological:     General: No focal deficit present.     Mental Status: She is alert.  Psychiatric:        Mood and Affect: Mood normal.     Data: No studies     Assessment/Plan:     83 year old female with end-stage renal disease currently dialyzing via catheter.  Given her multiple previous bilateral upper extremity access procedures we will begin with venography from the bilateral upper extremities.  She did have this performed to the left upper extremity about 2 years ago prior to graft placement but this will certainly need to be updated prior to consideration of any further procedures.  We discussed that she is likely not going to be a candidate for standard graft or fistula in the upper extremities but possibly could be a candidate for a hero graft.  We would otherwise need to consider thigh access as a means to get past the catheter.  All questions were answered in the presence of her family they demonstrate good understanding.     Maeola Harman MD [facility]

## 2024-02-07 ENCOUNTER — Other Ambulatory Visit: Payer: Self-pay

## 2024-02-07 DIAGNOSIS — N186 End stage renal disease: Secondary | ICD-10-CM | POA: Diagnosis not present

## 2024-02-07 DIAGNOSIS — Z992 Dependence on renal dialysis: Secondary | ICD-10-CM | POA: Diagnosis not present

## 2024-02-07 DIAGNOSIS — N2581 Secondary hyperparathyroidism of renal origin: Secondary | ICD-10-CM | POA: Diagnosis not present

## 2024-02-07 DIAGNOSIS — D631 Anemia in chronic kidney disease: Secondary | ICD-10-CM | POA: Diagnosis not present

## 2024-02-07 DIAGNOSIS — D688 Other specified coagulation defects: Secondary | ICD-10-CM | POA: Diagnosis not present

## 2024-02-07 DIAGNOSIS — I10 Essential (primary) hypertension: Secondary | ICD-10-CM | POA: Diagnosis not present

## 2024-02-07 DIAGNOSIS — R519 Headache, unspecified: Secondary | ICD-10-CM | POA: Diagnosis not present

## 2024-02-07 DIAGNOSIS — E1129 Type 2 diabetes mellitus with other diabetic kidney complication: Secondary | ICD-10-CM | POA: Diagnosis not present

## 2024-02-07 MED ORDER — SODIUM CHLORIDE 0.9% FLUSH
3.0000 mL | INTRAVENOUS | Status: DC | PRN
Start: 2024-02-07 — End: 2024-03-07

## 2024-02-07 MED ORDER — SODIUM CHLORIDE 0.9 % IV SOLN: 250.0000 mL | INTRAVENOUS | Status: AC | PRN

## 2024-02-07 MED ORDER — SODIUM CHLORIDE 0.9% FLUSH: 3.0000 mL | Freq: Two times a day (BID) | INTRAVENOUS | Status: DC

## 2024-02-09 DIAGNOSIS — D688 Other specified coagulation defects: Secondary | ICD-10-CM | POA: Diagnosis not present

## 2024-02-09 DIAGNOSIS — I10 Essential (primary) hypertension: Secondary | ICD-10-CM | POA: Diagnosis not present

## 2024-02-09 DIAGNOSIS — E1129 Type 2 diabetes mellitus with other diabetic kidney complication: Secondary | ICD-10-CM | POA: Diagnosis not present

## 2024-02-09 DIAGNOSIS — Z992 Dependence on renal dialysis: Secondary | ICD-10-CM | POA: Diagnosis not present

## 2024-02-09 DIAGNOSIS — N2581 Secondary hyperparathyroidism of renal origin: Secondary | ICD-10-CM | POA: Diagnosis not present

## 2024-02-09 DIAGNOSIS — N186 End stage renal disease: Secondary | ICD-10-CM | POA: Diagnosis not present

## 2024-02-09 DIAGNOSIS — D631 Anemia in chronic kidney disease: Secondary | ICD-10-CM | POA: Diagnosis not present

## 2024-02-09 DIAGNOSIS — R519 Headache, unspecified: Secondary | ICD-10-CM | POA: Diagnosis not present

## 2024-02-12 DIAGNOSIS — D688 Other specified coagulation defects: Secondary | ICD-10-CM | POA: Diagnosis not present

## 2024-02-12 DIAGNOSIS — I10 Essential (primary) hypertension: Secondary | ICD-10-CM | POA: Diagnosis not present

## 2024-02-12 DIAGNOSIS — N186 End stage renal disease: Secondary | ICD-10-CM | POA: Diagnosis not present

## 2024-02-12 DIAGNOSIS — N2581 Secondary hyperparathyroidism of renal origin: Secondary | ICD-10-CM | POA: Diagnosis not present

## 2024-02-12 DIAGNOSIS — Z992 Dependence on renal dialysis: Secondary | ICD-10-CM | POA: Diagnosis not present

## 2024-02-12 DIAGNOSIS — R519 Headache, unspecified: Secondary | ICD-10-CM | POA: Diagnosis not present

## 2024-02-12 DIAGNOSIS — E1129 Type 2 diabetes mellitus with other diabetic kidney complication: Secondary | ICD-10-CM | POA: Diagnosis not present

## 2024-02-12 DIAGNOSIS — D631 Anemia in chronic kidney disease: Secondary | ICD-10-CM | POA: Diagnosis not present

## 2024-02-14 DIAGNOSIS — I10 Essential (primary) hypertension: Secondary | ICD-10-CM | POA: Diagnosis not present

## 2024-02-14 DIAGNOSIS — N186 End stage renal disease: Secondary | ICD-10-CM | POA: Diagnosis not present

## 2024-02-14 DIAGNOSIS — D688 Other specified coagulation defects: Secondary | ICD-10-CM | POA: Diagnosis not present

## 2024-02-14 DIAGNOSIS — D631 Anemia in chronic kidney disease: Secondary | ICD-10-CM | POA: Diagnosis not present

## 2024-02-14 DIAGNOSIS — E1129 Type 2 diabetes mellitus with other diabetic kidney complication: Secondary | ICD-10-CM | POA: Diagnosis not present

## 2024-02-14 DIAGNOSIS — N2581 Secondary hyperparathyroidism of renal origin: Secondary | ICD-10-CM | POA: Diagnosis not present

## 2024-02-14 DIAGNOSIS — Z992 Dependence on renal dialysis: Secondary | ICD-10-CM | POA: Diagnosis not present

## 2024-02-14 DIAGNOSIS — R519 Headache, unspecified: Secondary | ICD-10-CM | POA: Diagnosis not present

## 2024-02-16 DIAGNOSIS — I10 Essential (primary) hypertension: Secondary | ICD-10-CM | POA: Diagnosis not present

## 2024-02-16 DIAGNOSIS — E1129 Type 2 diabetes mellitus with other diabetic kidney complication: Secondary | ICD-10-CM | POA: Diagnosis not present

## 2024-02-16 DIAGNOSIS — D688 Other specified coagulation defects: Secondary | ICD-10-CM | POA: Diagnosis not present

## 2024-02-16 DIAGNOSIS — N2581 Secondary hyperparathyroidism of renal origin: Secondary | ICD-10-CM | POA: Diagnosis not present

## 2024-02-16 DIAGNOSIS — N186 End stage renal disease: Secondary | ICD-10-CM | POA: Diagnosis not present

## 2024-02-16 DIAGNOSIS — D631 Anemia in chronic kidney disease: Secondary | ICD-10-CM | POA: Diagnosis not present

## 2024-02-16 DIAGNOSIS — Z992 Dependence on renal dialysis: Secondary | ICD-10-CM | POA: Diagnosis not present

## 2024-02-16 DIAGNOSIS — R519 Headache, unspecified: Secondary | ICD-10-CM | POA: Diagnosis not present

## 2024-02-19 DIAGNOSIS — N2581 Secondary hyperparathyroidism of renal origin: Secondary | ICD-10-CM | POA: Diagnosis not present

## 2024-02-19 DIAGNOSIS — D688 Other specified coagulation defects: Secondary | ICD-10-CM | POA: Diagnosis not present

## 2024-02-19 DIAGNOSIS — D631 Anemia in chronic kidney disease: Secondary | ICD-10-CM | POA: Diagnosis not present

## 2024-02-19 DIAGNOSIS — E1129 Type 2 diabetes mellitus with other diabetic kidney complication: Secondary | ICD-10-CM | POA: Diagnosis not present

## 2024-02-19 DIAGNOSIS — I10 Essential (primary) hypertension: Secondary | ICD-10-CM | POA: Diagnosis not present

## 2024-02-19 DIAGNOSIS — Z992 Dependence on renal dialysis: Secondary | ICD-10-CM | POA: Diagnosis not present

## 2024-02-19 DIAGNOSIS — N186 End stage renal disease: Secondary | ICD-10-CM | POA: Diagnosis not present

## 2024-02-19 DIAGNOSIS — R519 Headache, unspecified: Secondary | ICD-10-CM | POA: Diagnosis not present

## 2024-02-21 DIAGNOSIS — E1129 Type 2 diabetes mellitus with other diabetic kidney complication: Secondary | ICD-10-CM | POA: Diagnosis not present

## 2024-02-21 DIAGNOSIS — D688 Other specified coagulation defects: Secondary | ICD-10-CM | POA: Diagnosis not present

## 2024-02-21 DIAGNOSIS — N186 End stage renal disease: Secondary | ICD-10-CM | POA: Diagnosis not present

## 2024-02-21 DIAGNOSIS — I10 Essential (primary) hypertension: Secondary | ICD-10-CM | POA: Diagnosis not present

## 2024-02-21 DIAGNOSIS — Z992 Dependence on renal dialysis: Secondary | ICD-10-CM | POA: Diagnosis not present

## 2024-02-21 DIAGNOSIS — D631 Anemia in chronic kidney disease: Secondary | ICD-10-CM | POA: Diagnosis not present

## 2024-02-21 DIAGNOSIS — N2581 Secondary hyperparathyroidism of renal origin: Secondary | ICD-10-CM | POA: Diagnosis not present

## 2024-02-21 DIAGNOSIS — R519 Headache, unspecified: Secondary | ICD-10-CM | POA: Diagnosis not present

## 2024-02-22 DIAGNOSIS — N186 End stage renal disease: Secondary | ICD-10-CM | POA: Diagnosis not present

## 2024-02-22 DIAGNOSIS — E1122 Type 2 diabetes mellitus with diabetic chronic kidney disease: Secondary | ICD-10-CM | POA: Diagnosis not present

## 2024-02-22 DIAGNOSIS — Z992 Dependence on renal dialysis: Secondary | ICD-10-CM | POA: Diagnosis not present

## 2024-02-23 DIAGNOSIS — T8579XA Infection and inflammatory reaction due to other internal prosthetic devices, implants and grafts, initial encounter: Secondary | ICD-10-CM | POA: Diagnosis not present

## 2024-02-23 DIAGNOSIS — N2581 Secondary hyperparathyroidism of renal origin: Secondary | ICD-10-CM | POA: Diagnosis not present

## 2024-02-23 DIAGNOSIS — E1129 Type 2 diabetes mellitus with other diabetic kidney complication: Secondary | ICD-10-CM | POA: Diagnosis not present

## 2024-02-23 DIAGNOSIS — D688 Other specified coagulation defects: Secondary | ICD-10-CM | POA: Diagnosis not present

## 2024-02-23 DIAGNOSIS — N186 End stage renal disease: Secondary | ICD-10-CM | POA: Diagnosis not present

## 2024-02-23 DIAGNOSIS — D631 Anemia in chronic kidney disease: Secondary | ICD-10-CM | POA: Diagnosis not present

## 2024-02-23 DIAGNOSIS — R519 Headache, unspecified: Secondary | ICD-10-CM | POA: Diagnosis not present

## 2024-02-23 DIAGNOSIS — Z992 Dependence on renal dialysis: Secondary | ICD-10-CM | POA: Diagnosis not present

## 2024-02-25 ENCOUNTER — Encounter (HOSPITAL_COMMUNITY): Payer: Self-pay | Admitting: Vascular Surgery

## 2024-02-25 ENCOUNTER — Other Ambulatory Visit: Payer: Self-pay

## 2024-02-25 ENCOUNTER — Ambulatory Visit (HOSPITAL_COMMUNITY)
Admission: RE | Admit: 2024-02-25 | Discharge: 2024-02-25 | Disposition: A | Discharge status: Home / Self Care | Payer: Medicare Other | Attending: Vascular Surgery | Admitting: Vascular Surgery

## 2024-02-25 ENCOUNTER — Encounter (HOSPITAL_COMMUNITY)
Admission: RE | Disposition: A | Discharge status: Home / Self Care | Payer: Self-pay | Source: Home / Self Care | Attending: Vascular Surgery

## 2024-02-25 DIAGNOSIS — Z833 Family history of diabetes mellitus: Secondary | ICD-10-CM | POA: Insufficient documentation

## 2024-02-25 DIAGNOSIS — I120 Hypertensive chronic kidney disease with stage 5 chronic kidney disease or end stage renal disease: Secondary | ICD-10-CM | POA: Diagnosis not present

## 2024-02-25 DIAGNOSIS — Z992 Dependence on renal dialysis: Secondary | ICD-10-CM | POA: Insufficient documentation

## 2024-02-25 DIAGNOSIS — T82856A Stenosis of peripheral vascular stent, initial encounter: Secondary | ICD-10-CM | POA: Diagnosis not present

## 2024-02-25 DIAGNOSIS — Z794 Long term (current) use of insulin: Secondary | ICD-10-CM | POA: Insufficient documentation

## 2024-02-25 DIAGNOSIS — N186 End stage renal disease: Secondary | ICD-10-CM | POA: Insufficient documentation

## 2024-02-25 DIAGNOSIS — E1122 Type 2 diabetes mellitus with diabetic chronic kidney disease: Secondary | ICD-10-CM | POA: Insufficient documentation

## 2024-02-25 DIAGNOSIS — Z9889 Other specified postprocedural states: Secondary | ICD-10-CM | POA: Diagnosis not present

## 2024-02-25 DIAGNOSIS — N185 Chronic kidney disease, stage 5: Secondary | ICD-10-CM

## 2024-02-25 HISTORY — PX: UPPER EXTREMITY VENOGRAPHY: CATH118272

## 2024-02-25 LAB — POCT I-STAT, CHEM 8
BUN: 46 mg/dL — ABNORMAL HIGH (ref 8–23)
Calcium, Ion: 1.19 mmol/L (ref 1.15–1.40)
Chloride: 100 mmol/L (ref 98–111)
Creatinine, Ser: 10.6 mg/dL — ABNORMAL HIGH (ref 0.44–1.00)
Glucose, Bld: 132 mg/dL — ABNORMAL HIGH (ref 70–99)
HCT: 37 % (ref 36.0–46.0)
Hemoglobin: 12.6 g/dL (ref 12.0–15.0)
Potassium: 4.1 mmol/L (ref 3.5–5.1)
Sodium: 139 mmol/L (ref 135–145)
TCO2: 28 mmol/L (ref 22–32)

## 2024-02-25 LAB — GLUCOSE, CAPILLARY: Glucose-Capillary: 136 mg/dL — ABNORMAL HIGH (ref 70–99)

## 2024-02-25 SURGERY — UPPER EXTREMITY VENOGRAPHY
Anesthesia: LOCAL | Laterality: Bilateral

## 2024-02-25 MED ORDER — SODIUM CHLORIDE 0.9 % IV SOLN
INTRAVENOUS | Status: DC | PRN
  Administered 2024-02-25: 250 mL via INTRAVENOUS

## 2024-02-25 MED ORDER — IODIXANOL 320 MG/ML IV SOLN
INTRAVENOUS | Status: DC | PRN
  Administered 2024-02-25: 40 mL

## 2024-02-25 MED ORDER — LABETALOL HCL 5 MG/ML IV SOLN
INTRAVENOUS | Status: AC
  Filled 2024-02-25: qty 4

## 2024-02-25 MED ORDER — LABETALOL HCL 5 MG/ML IV SOLN
INTRAVENOUS | Status: DC | PRN
  Administered 2024-02-25: 10 mg via INTRAVENOUS

## 2024-02-25 NOTE — Op Note (Signed)
    Patient name: Katie Garcia MRN: [id number] DOB: July 06, 1941 Sex: female  02/25/2024 Pre-operative Diagnosis: End-stage renal disease Post-operative diagnosis:  Same Surgeon:  Apolinar Junes C. Randie Heinz, MD Procedure Performed: 1.  Bilateral upper extremity venography  Indications: 83 year old female with end-stage renal disease on dialysis via left IJ tunneled dialysis catheter.  She has multiple bilateral upper extremity access procedures which have failed.  She is now indicated for venography to determine her next access options.  Findings: Centrally she appears patent with the bilateral innominate veins and subclavian and single axillary veins patent.  The SVC is also patent although somewhat likely underfilled with the presence of a left IJ tunneled dialysis catheter.  There are occluded stents bilaterally in one of her paired axillary veins.  Plan will be for right arm AV graft possible axillary artery based graft with possible placement of hero graft on a nondialysis day in the near future.  She currently dialyzes on Tuesdays, Thursdays and Saturdays.   Procedure:  The patient was identified in the holding area and taken to room 8.  The patient was then placed supine on the table and prepped and draped in the usual sterile fashion.  Her previous IV sites bilaterally were accessed and contrast was injected with the above-noted findings.  Plan will be for right arm access on a nondialysis day in the near future.  She tolerated the procedure without any complication.  Contrast: 40 cc   Debbie Yearick C. Randie Heinz, MD [facility] [place] Office: [phone] Pager: [phone]

## 2024-02-25 NOTE — Interval H&P Note (Signed)
 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  The various methods of treatment have been discussed with the patient and family. After consideration of risks, benefits and other options for treatment, the patient has consented to  Procedure(s): UPPER EXTREMITY VENOGRAPHY (N/A) as a surgical intervention.  The patient's history has been reviewed, patient examined, no change in status, stable for surgery.  I have reviewed the patient's chart and labs.  Questions were answered to the patient's satisfaction.     Lemar Livings

## 2024-02-26 DIAGNOSIS — D631 Anemia in chronic kidney disease: Secondary | ICD-10-CM | POA: Diagnosis not present

## 2024-02-26 DIAGNOSIS — N2581 Secondary hyperparathyroidism of renal origin: Secondary | ICD-10-CM | POA: Diagnosis not present

## 2024-02-26 DIAGNOSIS — Z992 Dependence on renal dialysis: Secondary | ICD-10-CM | POA: Diagnosis not present

## 2024-02-26 DIAGNOSIS — E1129 Type 2 diabetes mellitus with other diabetic kidney complication: Secondary | ICD-10-CM | POA: Diagnosis not present

## 2024-02-26 DIAGNOSIS — T8579XA Infection and inflammatory reaction due to other internal prosthetic devices, implants and grafts, initial encounter: Secondary | ICD-10-CM | POA: Diagnosis not present

## 2024-02-26 DIAGNOSIS — R519 Headache, unspecified: Secondary | ICD-10-CM | POA: Diagnosis not present

## 2024-02-26 DIAGNOSIS — N186 End stage renal disease: Secondary | ICD-10-CM | POA: Diagnosis not present

## 2024-02-26 DIAGNOSIS — D688 Other specified coagulation defects: Secondary | ICD-10-CM | POA: Diagnosis not present

## 2024-02-27 DIAGNOSIS — E113212 Type 2 diabetes mellitus with mild nonproliferative diabetic retinopathy with macular edema, left eye: Secondary | ICD-10-CM | POA: Diagnosis not present

## 2024-02-28 DIAGNOSIS — D631 Anemia in chronic kidney disease: Secondary | ICD-10-CM | POA: Diagnosis not present

## 2024-02-28 DIAGNOSIS — Z992 Dependence on renal dialysis: Secondary | ICD-10-CM | POA: Diagnosis not present

## 2024-02-28 DIAGNOSIS — N186 End stage renal disease: Secondary | ICD-10-CM | POA: Diagnosis not present

## 2024-02-28 DIAGNOSIS — R519 Headache, unspecified: Secondary | ICD-10-CM | POA: Diagnosis not present

## 2024-02-28 DIAGNOSIS — T8579XA Infection and inflammatory reaction due to other internal prosthetic devices, implants and grafts, initial encounter: Secondary | ICD-10-CM | POA: Diagnosis not present

## 2024-02-28 DIAGNOSIS — E1129 Type 2 diabetes mellitus with other diabetic kidney complication: Secondary | ICD-10-CM | POA: Diagnosis not present

## 2024-02-28 DIAGNOSIS — N2581 Secondary hyperparathyroidism of renal origin: Secondary | ICD-10-CM | POA: Diagnosis not present

## 2024-02-28 DIAGNOSIS — D688 Other specified coagulation defects: Secondary | ICD-10-CM | POA: Diagnosis not present

## 2024-03-01 DIAGNOSIS — N2581 Secondary hyperparathyroidism of renal origin: Secondary | ICD-10-CM | POA: Diagnosis not present

## 2024-03-01 DIAGNOSIS — N186 End stage renal disease: Secondary | ICD-10-CM | POA: Diagnosis not present

## 2024-03-01 DIAGNOSIS — R519 Headache, unspecified: Secondary | ICD-10-CM | POA: Diagnosis not present

## 2024-03-01 DIAGNOSIS — D688 Other specified coagulation defects: Secondary | ICD-10-CM | POA: Diagnosis not present

## 2024-03-01 DIAGNOSIS — E1129 Type 2 diabetes mellitus with other diabetic kidney complication: Secondary | ICD-10-CM | POA: Diagnosis not present

## 2024-03-01 DIAGNOSIS — T8579XA Infection and inflammatory reaction due to other internal prosthetic devices, implants and grafts, initial encounter: Secondary | ICD-10-CM | POA: Diagnosis not present

## 2024-03-01 DIAGNOSIS — Z992 Dependence on renal dialysis: Secondary | ICD-10-CM | POA: Diagnosis not present

## 2024-03-01 DIAGNOSIS — D631 Anemia in chronic kidney disease: Secondary | ICD-10-CM | POA: Diagnosis not present

## 2024-03-04 DIAGNOSIS — N2581 Secondary hyperparathyroidism of renal origin: Secondary | ICD-10-CM | POA: Diagnosis not present

## 2024-03-04 DIAGNOSIS — T8579XA Infection and inflammatory reaction due to other internal prosthetic devices, implants and grafts, initial encounter: Secondary | ICD-10-CM | POA: Diagnosis not present

## 2024-03-04 DIAGNOSIS — E1129 Type 2 diabetes mellitus with other diabetic kidney complication: Secondary | ICD-10-CM | POA: Diagnosis not present

## 2024-03-04 DIAGNOSIS — Z992 Dependence on renal dialysis: Secondary | ICD-10-CM | POA: Diagnosis not present

## 2024-03-04 DIAGNOSIS — R519 Headache, unspecified: Secondary | ICD-10-CM | POA: Diagnosis not present

## 2024-03-04 DIAGNOSIS — D631 Anemia in chronic kidney disease: Secondary | ICD-10-CM | POA: Diagnosis not present

## 2024-03-04 DIAGNOSIS — N186 End stage renal disease: Secondary | ICD-10-CM | POA: Diagnosis not present

## 2024-03-04 DIAGNOSIS — D688 Other specified coagulation defects: Secondary | ICD-10-CM | POA: Diagnosis not present

## 2024-03-05 ENCOUNTER — Other Ambulatory Visit: Payer: Self-pay

## 2024-03-05 ENCOUNTER — Encounter (HOSPITAL_COMMUNITY): Payer: Self-pay | Admitting: Vascular Surgery

## 2024-03-05 NOTE — Pre-Procedure Instructions (Signed)
-------------    SDW INSTRUCTIONS given:  Your procedure is scheduled on 3/14.  Report to [facility] Main Entrance "A" at 11:40 A.M., and check in at the Admitting office.  Any questions or running late day of surgery: call [phone]    Remember:  Do not eat or drink after midnight the night before your surgery    Take these medicines the morning of surgery with A SIP OF WATER  Coreg  Claritin  Crestor  symbicort    May take these medicines IF NEEDED: tylenol   As of today, STOP taking any Aspirin (unless otherwise instructed by your surgeon) Aleve, Naproxen, Ibuprofen, Motrin, Advil, Goody's, BC's, all herbal medications, fish oil, and all vitamins.   Do NOT Smoke (Tobacco/Vaping) 24 hours prior to your procedure  If you use a CPAP at night, you may bring all equipment for your overnight stay.     You will be asked to remove any contacts, glasses, piercing's, hearing aid's, dentures/partials prior to surgery. Please bring cases for these items if needed.     Patients discharged the day of surgery will not be allowed to drive home, and someone needs to stay with them for 24 hours.  SURGICAL WAITING ROOM VISITATION Patients may have no more than 2 support people in the waiting area - these visitors may rotate.   Pre-op nurse will coordinate an appropriate time for 1 ADULT support person, who may not rotate, to accompany patient in pre-op.  Children under the age of 28 must have an adult with them who is not the patient and must remain in the main waiting area with an adult.  If the patient needs to stay at the hospital during part of their recovery, the visitor guidelines for inpatient rooms apply.  Please refer to the [facility] website for the visitor guidelines for any additional information.   Special instructions:   [place]- Preparing For Surgery   Please follow these instructions carefully.   Shower the NIGHT BEFORE SURGERY and the MORNING OF SURGERY with  DIAL Soap.   Pat yourself dry with a CLEAN TOWEL.  Wear CLEAN PAJAMAS to bed the night before surgery  Place CLEAN SHEETS on your bed the night of your first shower and DO NOT SLEEP WITH PETS.   Additional instructions for the day of surgery: DO NOT APPLY any lotions, deodorants, cologne, or perfumes.   Do not wear jewelry or makeup Do not wear nail polish, gel polish, artificial nails, or any other type of covering on natural nails (fingers and toes) Do not bring valuables to the hospital. [facility] is not responsible for valuables/personal belongings. Put on clean/comfortable clothes.  Please brush your teeth.  Ask your nurse before applying any prescription medications to the skin.

## 2024-03-05 NOTE — Progress Notes (Signed)
 PCP - Dr. Erma Heritage Cardiologist - denies  PPM/ICD - denies   Chest x-ray - 01/12/23 EKG - 02/25/24 Stress Test - denies ECHO - 04/11/18 Cardiac Cath - denies  CPAP - denies  Fasting Blood Sugar - 150-180 Checks Blood Sugar continuously (sensor on right arm)  ASA/Blood Thinner Instructions: n/a   ERAS Protcol - no, NPO  COVID TEST- n/a  Anesthesia review: no  Patient verbally denies any shortness of breath, fever, cough and chest pain during phone call      Questions were answered. Patient verbalized understanding of instructions.

## 2024-03-06 DIAGNOSIS — D688 Other specified coagulation defects: Secondary | ICD-10-CM | POA: Diagnosis not present

## 2024-03-06 DIAGNOSIS — T8579XA Infection and inflammatory reaction due to other internal prosthetic devices, implants and grafts, initial encounter: Secondary | ICD-10-CM | POA: Diagnosis not present

## 2024-03-06 DIAGNOSIS — D631 Anemia in chronic kidney disease: Secondary | ICD-10-CM | POA: Diagnosis not present

## 2024-03-06 DIAGNOSIS — Z992 Dependence on renal dialysis: Secondary | ICD-10-CM | POA: Diagnosis not present

## 2024-03-06 DIAGNOSIS — N186 End stage renal disease: Secondary | ICD-10-CM | POA: Diagnosis not present

## 2024-03-06 DIAGNOSIS — N2581 Secondary hyperparathyroidism of renal origin: Secondary | ICD-10-CM | POA: Diagnosis not present

## 2024-03-06 DIAGNOSIS — R519 Headache, unspecified: Secondary | ICD-10-CM | POA: Diagnosis not present

## 2024-03-06 DIAGNOSIS — E1129 Type 2 diabetes mellitus with other diabetic kidney complication: Secondary | ICD-10-CM | POA: Diagnosis not present

## 2024-03-07 ENCOUNTER — Other Ambulatory Visit: Payer: Self-pay

## 2024-03-07 ENCOUNTER — Ambulatory Visit (HOSPITAL_COMMUNITY)
Admission: RE | Admit: 2024-03-07 | Discharge: 2024-03-07 | Disposition: A | Discharge status: Home / Self Care | Attending: Vascular Surgery | Admitting: Vascular Surgery

## 2024-03-07 ENCOUNTER — Encounter (HOSPITAL_COMMUNITY)
Admission: RE | Disposition: A | Discharge status: Home / Self Care | Payer: Self-pay | Source: Home / Self Care | Attending: Vascular Surgery

## 2024-03-07 ENCOUNTER — Ambulatory Visit (HOSPITAL_BASED_OUTPATIENT_CLINIC_OR_DEPARTMENT_OTHER): Admitting: Anesthesiology

## 2024-03-07 ENCOUNTER — Ambulatory Visit (HOSPITAL_COMMUNITY): Admitting: Anesthesiology

## 2024-03-07 ENCOUNTER — Encounter (HOSPITAL_COMMUNITY): Payer: Self-pay | Admitting: Vascular Surgery

## 2024-03-07 DIAGNOSIS — N185 Chronic kidney disease, stage 5: Secondary | ICD-10-CM | POA: Diagnosis not present

## 2024-03-07 DIAGNOSIS — Z992 Dependence on renal dialysis: Secondary | ICD-10-CM | POA: Diagnosis not present

## 2024-03-07 DIAGNOSIS — E1122 Type 2 diabetes mellitus with diabetic chronic kidney disease: Secondary | ICD-10-CM

## 2024-03-07 DIAGNOSIS — N186 End stage renal disease: Secondary | ICD-10-CM | POA: Diagnosis not present

## 2024-03-07 DIAGNOSIS — I120 Hypertensive chronic kidney disease with stage 5 chronic kidney disease or end stage renal disease: Secondary | ICD-10-CM | POA: Diagnosis not present

## 2024-03-07 DIAGNOSIS — Z794 Long term (current) use of insulin: Secondary | ICD-10-CM | POA: Insufficient documentation

## 2024-03-07 DIAGNOSIS — E1151 Type 2 diabetes mellitus with diabetic peripheral angiopathy without gangrene: Secondary | ICD-10-CM | POA: Insufficient documentation

## 2024-03-07 HISTORY — PX: AV FISTULA PLACEMENT: SHX1204

## 2024-03-07 LAB — POCT I-STAT, CHEM 8
BUN: 34 mg/dL — ABNORMAL HIGH (ref 8–23)
Calcium, Ion: 1.18 mmol/L (ref 1.15–1.40)
Chloride: 100 mmol/L (ref 98–111)
Creatinine, Ser: 7.2 mg/dL — ABNORMAL HIGH (ref 0.44–1.00)
Glucose, Bld: 138 mg/dL — ABNORMAL HIGH (ref 70–99)
HCT: 38 % (ref 36.0–46.0)
Hemoglobin: 12.9 g/dL (ref 12.0–15.0)
Potassium: 3.6 mmol/L (ref 3.5–5.1)
Sodium: 140 mmol/L (ref 135–145)
TCO2: 30 mmol/L (ref 22–32)

## 2024-03-07 LAB — GLUCOSE, CAPILLARY
Glucose-Capillary: 130 mg/dL — ABNORMAL HIGH (ref 70–99)
Glucose-Capillary: 151 mg/dL — ABNORMAL HIGH (ref 70–99)
Glucose-Capillary: 121 mg/dL — ABNORMAL HIGH (ref 70–99)
Glucose-Capillary: 106 mg/dL — ABNORMAL HIGH (ref 70–99)
Glucose-Capillary: 135 mg/dL — ABNORMAL HIGH (ref 70–99)

## 2024-03-07 SURGERY — INSERTION OF ARTERIOVENOUS (AV) GORE-TEX GRAFT ARM
Anesthesia: General | Site: Arm Upper | Laterality: Right

## 2024-03-07 MED ORDER — ROCURONIUM BROMIDE 10 MG/ML (PF) SYRINGE
PREFILLED_SYRINGE | INTRAVENOUS | Status: AC
  Filled 2024-03-07: qty 50

## 2024-03-07 MED ORDER — SODIUM CHLORIDE 0.9% FLUSH: 3.0000 mL | INTRAVENOUS | Status: DC | PRN

## 2024-03-07 MED ORDER — CARVEDILOL 3.125 MG PO TABS
ORAL_TABLET | ORAL | Status: AC
  Filled 2024-03-07: qty 2

## 2024-03-07 MED ORDER — INSULIN ASPART 100 UNIT/ML IJ SOLN: 0.0000 [IU] | INTRAMUSCULAR | Status: DC | PRN

## 2024-03-07 MED ORDER — 0.9 % SODIUM CHLORIDE (POUR BTL) OPTIME
TOPICAL | Status: DC | PRN
  Administered 2024-03-07: 1000 mL

## 2024-03-07 MED ORDER — OXYCODONE HCL 5 MG/5ML PO SOLN: 5.0000 mg | Freq: Once | ORAL | Status: DC | PRN

## 2024-03-07 MED ORDER — ORAL CARE MOUTH RINSE: 15.0000 mL | Freq: Once | OROMUCOSAL | Status: AC

## 2024-03-07 MED ORDER — FENTANYL CITRATE (PF) 250 MCG/5ML IJ SOLN
INTRAMUSCULAR | Status: DC | PRN
  Administered 2024-03-07 (×3): 50 ug via INTRAVENOUS

## 2024-03-07 MED ORDER — OXYCODONE HCL 5 MG PO TABS: 5.0000 mg | ORAL_TABLET | Freq: Once | ORAL | Status: DC | PRN

## 2024-03-07 MED ORDER — EPHEDRINE 5 MG/ML INJ
INTRAVENOUS | Status: AC
  Filled 2024-03-07: qty 10

## 2024-03-07 MED ORDER — LIDOCAINE HCL (PF) 1 % IJ SOLN
INTRAMUSCULAR | Status: AC
  Filled 2024-03-07: qty 30

## 2024-03-07 MED ORDER — FENTANYL CITRATE (PF) 250 MCG/5ML IJ SOLN
INTRAMUSCULAR | Status: AC
  Filled 2024-03-07: qty 5

## 2024-03-07 MED ORDER — HEPARIN 6000 UNIT IRRIGATION SOLUTION
Status: DC | PRN
  Administered 2024-03-07: 1

## 2024-03-07 MED ORDER — ONDANSETRON HCL 4 MG/2ML IJ SOLN
INTRAMUSCULAR | Status: DC | PRN
  Administered 2024-03-07: 4 mg via INTRAVENOUS

## 2024-03-07 MED ORDER — CHLORHEXIDINE GLUCONATE 4 % EX SOLN: 60.0000 mL | Freq: Once | CUTANEOUS | Status: DC

## 2024-03-07 MED ORDER — FENTANYL CITRATE (PF) 100 MCG/2ML IJ SOLN: 25.0000 ug | INTRAMUSCULAR | Status: DC | PRN

## 2024-03-07 MED ORDER — SUGAMMADEX SODIUM 200 MG/2ML IV SOLN
INTRAVENOUS | Status: AC
  Filled 2024-03-07: qty 2

## 2024-03-07 MED ORDER — PROPOFOL 10 MG/ML IV BOLUS
INTRAVENOUS | Status: DC | PRN
  Administered 2024-03-07: 130 mg via INTRAVENOUS

## 2024-03-07 MED ORDER — CHLORHEXIDINE GLUCONATE 0.12 % MT SOLN
15.0000 mL | Freq: Once | OROMUCOSAL | Status: AC
  Administered 2024-03-07: 15 mL via OROMUCOSAL
  Filled 2024-03-07: qty 15

## 2024-03-07 MED ORDER — SODIUM CHLORIDE 0.9% FLUSH: 3.0000 mL | Freq: Two times a day (BID) | INTRAVENOUS | Status: DC

## 2024-03-07 MED ORDER — VANCOMYCIN HCL IN DEXTROSE 1-5 GM/200ML-% IV SOLN
1000.0000 mg | INTRAVENOUS | Status: AC
  Administered 2024-03-07: 1000 mg via INTRAVENOUS
  Filled 2024-03-07: qty 200

## 2024-03-07 MED ORDER — LIDOCAINE 2% (20 MG/ML) 5 ML SYRINGE
INTRAMUSCULAR | Status: DC | PRN
Start: 2024-03-07 — End: 2024-03-07
  Administered 2024-03-07: 60 mg via INTRAVENOUS

## 2024-03-07 MED ORDER — SUGAMMADEX SODIUM 200 MG/2ML IV SOLN
INTRAVENOUS | Status: DC | PRN
Start: 2024-03-07 — End: 2024-03-07
  Administered 2024-03-07: 200 mg via INTRAVENOUS

## 2024-03-07 MED ORDER — SODIUM CHLORIDE 0.9% FLUSH
3.0000 mL | Freq: Two times a day (BID) | INTRAVENOUS | Status: DC
Start: 2024-03-07 — End: 2024-03-08

## 2024-03-07 MED ORDER — PROTAMINE SULFATE 10 MG/ML IV SOLN
INTRAVENOUS | Status: AC
  Filled 2024-03-07: qty 5

## 2024-03-07 MED ORDER — CARVEDILOL 6.25 MG PO TABS
6.2500 mg | ORAL_TABLET | Freq: Once | ORAL | Status: AC
  Administered 2024-03-07: 6.25 mg via ORAL
  Filled 2024-03-07: qty 1

## 2024-03-07 MED ORDER — LABETALOL HCL 5 MG/ML IV SOLN
INTRAVENOUS | Status: AC
  Filled 2024-03-07: qty 4

## 2024-03-07 MED ORDER — ROCURONIUM BROMIDE 10 MG/ML (PF) SYRINGE
PREFILLED_SYRINGE | INTRAVENOUS | Status: DC | PRN
  Administered 2024-03-07: 50 mg via INTRAVENOUS

## 2024-03-07 MED ORDER — PHENYLEPHRINE 80 MCG/ML (10ML) SYRINGE FOR IV PUSH (FOR BLOOD PRESSURE SUPPORT)
PREFILLED_SYRINGE | INTRAVENOUS | Status: AC
  Filled 2024-03-07: qty 30

## 2024-03-07 MED ORDER — OXYCODONE-ACETAMINOPHEN 5-325 MG PO TABS: 1.0000 | ORAL_TABLET | Freq: Three times a day (TID) | ORAL | 0 refills | Status: DC | PRN

## 2024-03-07 MED ORDER — PHENYLEPHRINE HCL-NACL 20-0.9 MG/250ML-% IV SOLN
INTRAVENOUS | Status: DC | PRN
  Administered 2024-03-07: 25 ug/min via INTRAVENOUS

## 2024-03-07 MED ORDER — SODIUM CHLORIDE 0.9 % IV SOLN: INTRAVENOUS | Status: DC | PRN

## 2024-03-07 SURGICAL SUPPLY — 30 items
ARMBAND PINK RESTRICT EXTREMIT (MISCELLANEOUS) ×2 IMPLANT
BAG COUNTER SPONGE SURGICOUNT (BAG) ×1 IMPLANT
CANISTER SUCT 3000ML PPV (MISCELLANEOUS) ×1 IMPLANT
CLIP LIGATING EXTRA MED SLVR (CLIP) ×1 IMPLANT
CLIP LIGATING EXTRA SM BLUE (MISCELLANEOUS) ×1 IMPLANT
DERMABOND ADVANCED .7 DNX12 (GAUZE/BANDAGES/DRESSINGS) ×1 IMPLANT
ELECT REM PT RETURN 9FT ADLT (ELECTROSURGICAL) ×1 IMPLANT
ELECTRODE REM PT RTRN 9FT ADLT (ELECTROSURGICAL) ×1 IMPLANT
GLOVE BIO SURGEON STRL SZ7.5 (GLOVE) ×1 IMPLANT
GOWN STRL REUS W/ TWL LRG LVL3 (GOWN DISPOSABLE) ×2 IMPLANT
GOWN STRL REUS W/ TWL XL LVL3 (GOWN DISPOSABLE) ×1 IMPLANT
GRAFT GORETEX STRT 4-7X45 (Vascular Products) IMPLANT
HEMOSTAT SNOW SURGICEL 2X4 (HEMOSTASIS) IMPLANT
INSERT FOGARTY SM (MISCELLANEOUS) ×1 IMPLANT
KIT BASIN OR (CUSTOM PROCEDURE TRAY) ×1 IMPLANT
KIT TURNOVER KIT B (KITS) ×1 IMPLANT
NS IRRIG 1000ML POUR BTL (IV SOLUTION) ×1 IMPLANT
PACK CV ACCESS (CUSTOM PROCEDURE TRAY) ×1 IMPLANT
PAD ARMBOARD POSITIONER FOAM (MISCELLANEOUS) ×2 IMPLANT
POWDER SURGICEL 3.0 GRAM (HEMOSTASIS) IMPLANT
SLING ARM FOAM STRAP LRG (SOFTGOODS) IMPLANT
SLING ARM FOAM STRAP MED (SOFTGOODS) IMPLANT
SUT MNCRL AB 4-0 PS2 18 (SUTURE) IMPLANT
SUT PROLENE 6 0 BV (SUTURE) IMPLANT
SUT SILK 2 0 SH (SUTURE) IMPLANT
SUT VIC AB 3-0 SH 27X BRD (SUTURE) ×2 IMPLANT
SYR TOOMEY 50ML (SYRINGE) IMPLANT
TOWEL GREEN STERILE (TOWEL DISPOSABLE) ×1 IMPLANT
UNDERPAD 30X36 HEAVY ABSORB (UNDERPADS AND DIAPERS) ×1 IMPLANT
WATER STERILE IRR 1000ML POUR (IV SOLUTION) ×1 IMPLANT

## 2024-03-07 NOTE — Transfer of Care (Signed)
 Immediate Anesthesia Transfer of Care Note  Patient: Katie Garcia  Procedure(s) Performed: INSERTION OF ARTERIOVENOUS (AV) GORE-TEX GRAFT ARM (Right: Arm Upper)  Patient Location: PACU  Anesthesia Type:General  Level of Consciousness: drowsy  Airway & Oxygen Therapy: Patient Spontanous Breathing and Patient connected to face mask oxygen  Post-op Assessment: Report given to RN and Post -op Vital signs reviewed and stable  Post vital signs: Reviewed and stable  Last Vitals:  Vitals Value Taken Time  BP    Temp    Pulse    Resp    SpO2      Last Pain:  Vitals:   03/07/24 1037  TempSrc:   PainSc: 0-No pain         Complications: No notable events documented.

## 2024-03-07 NOTE — Anesthesia Preprocedure Evaluation (Addendum)
 Anesthesia Evaluation  Patient identified by MRN, date of birth, ID band Patient awake    Reviewed: Allergy & Precautions, H&P , NPO status , Patient's Chart, lab work & pertinent test results, reviewed documented beta blocker date and time   History of Anesthesia Complications Negative for: history of anesthetic complications  Airway Mallampati: II  TM Distance: >3 FB Neck ROM: Full    Dental no notable dental hx. (+) Edentulous Upper, Edentulous Lower, Dental Advisory Given   Pulmonary neg pulmonary ROS, asthma    Pulmonary exam normal breath sounds clear to auscultation       Cardiovascular hypertension, Pt. on medications and Pt. on home beta blockers + Peripheral Vascular Disease   Rhythm:Regular Rate:Normal     Neuro/Psych  Headaches   Depression       GI/Hepatic negative GI ROS, Neg liver ROS,GERD  ,,  Endo/Other  diabetes, Insulin Dependent    Renal/GU Dialysis and ESRFRenal disease (HD T/Th/Sa)  negative genitourinary   Musculoskeletal  (+) Arthritis ,    Abdominal   Peds  Hematology  (+) Blood dyscrasia, anemia   Anesthesia Other Findings Day of surgery medications reviewed with patient.  Reproductive/Obstetrics negative OB ROS                             Anesthesia Physical Anesthesia Plan  ASA: 3  Anesthesia Plan: General   Post-op Pain Management: Ofirmev IV (intra-op)*   Induction: Intravenous  PONV Risk Score and Plan: 4 or greater and Ondansetron and Dexamethasone  Airway Management Planned: LMA and Oral ETT  Additional Equipment: None  Intra-op Plan:   Post-operative Plan: Extubation in OR  Informed Consent: I have reviewed the patients History and Physical, chart, labs and discussed the procedure including the risks, benefits and alternatives for the proposed anesthesia with the patient or authorized representative who has indicated his/her understanding  and acceptance.     Dental advisory given  Plan Discussed with: CRNA  Anesthesia Plan Comments:         Anesthesia Quick Evaluation

## 2024-03-07 NOTE — Discharge Instructions (Signed)
   [facility]  Discharge Instructions  AV Fistula or Graft Surgery for Dialysis Access  Please refer to the following instructions for your post-procedure care. Your surgeon or physician assistant will discuss any changes with you.  Activity  You may drive the day following your surgery, if you are comfortable and no longer taking prescription pain medication. Resume full activity as the soreness in your incision resolves.  Bathing/Showering  You may shower after you go home. Keep your incision dry for 48 hours. Do not soak in a bathtub, hot tub, or swim until the incision heals completely. You may not shower if you have a hemodialysis catheter.  Incision Care  Clean your incision with mild soap and water after 48 hours. Pat the area dry with a clean towel. You do not need a bandage unless otherwise instructed. Do not apply any ointments or creams to your incision. You may have skin glue on your incision. Do not peel it off. It will come off on its own in about one week. Your arm may swell a bit after surgery. To reduce swelling use pillows to elevate your arm so it is above your heart. Your doctor will tell you if you need to lightly wrap your arm with an ACE bandage.  Diet  Resume your normal diet. There are not special food restrictions following this procedure. In order to heal from your surgery, it is CRITICAL to get adequate nutrition. Your body requires vitamins, minerals, and protein. Vegetables are the best source of vitamins and minerals. Vegetables also provide the perfect balance of protein. Processed food has little nutritional value, so try to avoid this.  Medications  Resume taking all of your medications. If your incision is causing pain, you may take over-the counter pain relievers such as acetaminophen (Tylenol). If you were prescribed a stronger pain medication, please be aware these medications can cause nausea and constipation. Prevent  nausea by taking the medication with a snack or meal. Avoid constipation by drinking plenty of fluids and eating foods with high amount of fiber, such as fruits, vegetables, and grains.  Do not take Tylenol if you are taking prescription pain medications.  Follow up Your surgeon may want to see you in the office following your access surgery. If so, this will be arranged at the time of your surgery.  Please call us immediately for any of the following conditions:  Increased pain, redness, drainage (pus) from your incision site Fever of 101 degrees or higher Severe or worsening pain at your incision site Hand pain or numbness.  Reduce your risk of vascular disease:  Stop smoking. If you would like help, call [facility] at 1-800-QUIT-NOW ((920) 344-9259) or [place] at [phone]  Manage your cholesterol Maintain a desired weight Control your diabetes Keep your blood pressure down  Dialysis  It will take several weeks to several months for your new dialysis access to be ready for use. Your surgeon will determine when it is okay to use it. Your nephrologist will continue to direct your dialysis. You can continue to use your Permcath until your new access is ready for use.   03/07/2024 Katie Garcia [id number] August 12, 1941  Surgeon(s): Maeola Harman, MD  Procedure(s): INSERTION OF ARTERIOVENOUS (AV) GORE-TEX GRAFT ARM  x Do not stick graft for 4 weeks    If you have any questions, please call the office at [phone].

## 2024-03-07 NOTE — Progress Notes (Signed)
 Pt's BP 180/95; HR 94. Per pt she did not take her Carvedilol this am. Carvedilol is given per. Dr. Stephannie Peters order.

## 2024-03-07 NOTE — Anesthesia Procedure Notes (Signed)
 Procedure Name: Intubation Date/Time: 03/07/2024 6:31 PM  Performed by: Venia Carbon, CRNAPre-anesthesia Checklist: Patient identified, Emergency Drugs available, Suction available, Patient being monitored and Timeout performed Patient Re-evaluated:Patient Re-evaluated prior to induction Oxygen Delivery Method: Circle system utilized Preoxygenation: Pre-oxygenation with 100% oxygen Induction Type: IV induction Ventilation: Mask ventilation without difficulty and Oral airway inserted - appropriate to patient size Laryngoscope Size: Mac and 3 Grade View: Grade I Tube type: Oral Tube size: 7.5 mm Number of attempts: 1 Airway Equipment and Method: Patient positioned with wedge pillow and Stylet Placement Confirmation: ETT inserted through vocal cords under direct vision, positive ETCO2, CO2 detector and breath sounds checked- equal and bilateral Secured at: 22 cm Tube secured with: Tape

## 2024-03-07 NOTE — Op Note (Signed)
    Patient name: Katie Garcia MRN: [id number] DOB: August 03, 1941 Sex: female  03/07/2024 Pre-operative Diagnosis: End-stage renal disease Post-operative diagnosis:  Same Surgeon:  Apolinar Junes C. Randie Heinz, MD Assistant: Doreatha Massed, PA Procedure Performed:  Right upper arm axillary artery to axillary vein 4-7 mm stretch PTFE AV loop graft  Indications: 83 year old female with history of multiple bilateral upper extremity dialysis accesses.  She has undergone preoperative planning with venography and is now indicated for right upper extremity graft.  Experience assistant was necessary to facilitate reexposure of the axillary artery and vein as well as tunneling the graft and performing anastomosis end to side to the artery and end to end to the axillary vein.  Findings: The axillary artery was free of disease with a strong pulsatility.  There was a patent axillary vein and a loop graft was placed at the completion there was a very strong thrill and a 1+ palpable radial artery confirmed with Doppler.   Procedure:  The patient was identified in the holding area and taken to the operating room where she was placed supine on upper table and LMA anesthesia was induced.  She was sterilely prepped and draped in the right upper extremity in usual fashion, antibiotics administered and timeout was called.  Ultrasound was used and we identified 1 patent axillary vein.  We reopened the incision in the axilla dissected down through dense scar tissue identified a previous axillary graft and transected this and tied off and excised the most cephalad aspect of this.  We dissected down to find a patent axillary vein marked this for orientation encircled with vessel loop.  Through the same incision through dense scar tissue identified the axillary artery distal to the profunda brachii artery.  Vesseloops were placed around the artery distal to this.  A counterincision was created and the graft was tunneled in the loop  configuration.  The artery was clamped distally and proximally opened longitudinally and the 4 mm end of the graft was spatulated and sewn into side to the axillary artery.  Prior completion we released the clamps allowed flushing and then completed the anastomosis.  There was strong pulsatility through the graft.  Through the counterincision we clamped the graft.  The axillary vein was then transected and tied off distally and spatulated.  The graft was trimmed to size and spatulated and sewn end to end with 6-0 Prolene suture.  Prior completion without flushing all directions.  Upon completion there was very strong thrill in the graft with Doppler confirmed in the axillary artery and vein and a 1+ palpable radial pulse at the wrist confirmed with Doppler.  The wound was irrigated and the counterincision was closed with 4-0 Monocryl and the axillary incision was closed with running Vicryl followed by 4 Monocryl and Dermabond was placed to both sides.  The patient was awakened from anesthesia having tolerated the procedure without immediate complication.  All counts were correct at completion.  EBL: 50 cc    Mikaelah Trostle C. Randie Heinz, MD [facility] [place] Office: [phone] Pager: [phone]

## 2024-03-07 NOTE — H&P (Signed)
 HPI:   Katie Garcia is a 83 y.o. female history of multiple previous bilateral upper extremity dialysis accesses.  Currently dialyzes Tuesdays, Thursdays and Saturdays most recently via left upper arm AV graft which is now thrombosed at the time of Christmas.  She has a left IJ tunneled dialysis catheter which she is using now but has had to be replaced once already.  She does not have any previous lower extremity interventions.       Past Medical History:  Diagnosis Date   Anemia     Arthritis     COVID      hospitalized   Diabetes mellitus without complication (HCC)     ESRD      Dialysis T/Th/Sa at [address]   GERD (gastroesophageal reflux disease)     Gout     Headache     Hypertension     Type 2 diabetes mellitus (HCC)               Family History  Problem Relation Age of Onset   Diabetes Mother               Past Surgical History:  Procedure Laterality Date   A/V FISTULAGRAM Left 04/05/2022    Procedure: A/V Fistulagram;  Surgeon: Victorino Sparrow, MD;  Location: [facility] INVASIVE CV LAB;  Service: Cardiovascular;  Laterality: Left;   ABDOMINAL HYSTERECTOMY       AV FISTULA PLACEMENT Left 10/02/2016    Procedure: ARTERIOVENOUS (AV) FISTULA CREATION LEFT UPPER ARM;  Surgeon: Maeola Harman, MD;  Location: [facility] OR;  Service: Vascular;  Laterality: Left;   AV FISTULA PLACEMENT Right 07/14/2020    Procedure: RIGHT ARM ARTERIOVENOUS FISTULA CREATION;  Surgeon: Maeola Harman, MD;  Location: [facility] OR;  Service: Vascular;  Laterality: Right;   AV FISTULA PLACEMENT Left 05/16/2021    Procedure: INSERTION OF LEFT UPPER ARM ARTERIOVENOUS (AV) GORE-TEX GRAFT;  Surgeon: Cephus Shelling, MD;  Location: [facility] OR;  Service: Vascular;  Laterality: Left;   AV FISTULA PLACEMENT Right 07/13/2021    Procedure: RIGHT ARM ARTERIOVENOUS (AV) FISTULA GRAFT INSERTION;  Surgeon: Maeola Harman, MD;  Location: [facility] OR;  Service: Vascular;  Laterality: Right;   AV FISTULA  PLACEMENT Left 02/03/2022    Procedure: LEFT UPPER ARM ARTERIOVENOUS FISTULA CREATION;  Surgeon: Maeola Harman, MD;  Location: [facility] OR;  Service: Vascular;  Laterality: Left;   AV FISTULA PLACEMENT Left 06/23/2022    Procedure: INSERTION OF LEFT ARM ARTERIOVENOUS (AV) GORE-TEX GRAFT;  Surgeon: Maeola Harman, MD;  Location: [facility] OR;  Service: Vascular;  Laterality: Left;   BASCILIC VEIN TRANSPOSITION Left 05/17/2020    Procedure: Insertion of Left arm arteriovenous gortex graftarm ;  Surgeon: Chuck Hint, MD;  Location: [facility] OR;  Service: Vascular;  Laterality: Left;   CESAREAN SECTION       DIALYSIS/PERMA CATHETER INSERTION       DIALYSIS/PERMA CATHETER INSERTION   12/10/2023    Procedure: DIALYSIS/PERMA CATHETER INSERTION;  Surgeon: Daria Pastures, MD;  Location: [facility] INVASIVE CV LAB;  Service: Cardiovascular;;   DIALYSIS/PERMA CATHETER INSERTION N/A 01/18/2024    Procedure: DIALYSIS/PERMA CATHETER INSERTION;  Surgeon: Tyler Pita, MD;  Location: [facility] INVASIVE CV LAB;  Service: Cardiovascular;  Laterality: N/A;   IR FLUORO GUIDE CV LINE LEFT   02/09/2022   IR PTA VENOUS EXCEPT DIALYSIS CIRCUIT   02/09/2022   IR VENOCAVAGRAM SVC   02/09/2022   PERIPHERAL VASCULAR THROMBECTOMY  N/A 12/10/2023    Procedure: PERIPHERAL VASCULAR THROMBECTOMY;  Surgeon: Daria Pastures, MD;  Location: [facility] INVASIVE CV LAB;  Service: Cardiovascular;  Laterality: N/A;   REVISON OF ARTERIOVENOUS FISTULA Right 09/15/2020    Procedure: REVISON OF RIGHT UPPER ARM  ARTERIOVENOUS FISTULA;  Surgeon: Maeola Harman, MD;  Location: [facility] OR;  Service: Vascular;  Laterality: Right;   UPPER EXTREMITY VENOGRAPHY Bilateral 04/18/2021    Procedure: UPPER EXTREMITY VENOGRAPHY;  Surgeon: Maeola Harman, MD;  Location: [facility] INVASIVE CV LAB;  Service: Cardiovascular;  Laterality: Bilateral;   UPPER EXTREMITY VENOGRAPHY Left 01/23/2022    Procedure: UPPER EXTREMITY VENOGRAPHY;  Surgeon: Maeola Harman, MD;  Location: [facility] INVASIVE CV LAB;  Service: Cardiovascular;  Laterality: Left;          Short Social History:  Social History        Tobacco Use   Smoking status: Never   Smokeless tobacco: Never  Substance Use Topics   Alcohol use: Not Currently      Allergies       Allergies  Allergen Reactions   Penicillins Rash      Did it involve swelling of the face/tongue/throat, SOB, or low BP? No Did it involve sudden or severe rash/hives, skin peeling, or any reaction on the inside of your mouth or nose? Yes Did you need to seek medical attention at a hospital or doctor's office? N/A When did it last happen? N/A      If all above answers are "NO", may proceed with cephalosporin use.              Current Outpatient Medications  Medication Sig Dispense Refill   Accu-Chek FastClix Lancets MISC         ACCU-CHEK GUIDE test strip 1 each by Other route in the morning and at bedtime. E11.22   1   acetaminophen (TYLENOL) 500 MG tablet Take 500-1,000 mg by mouth every 6 (six) hours as needed for moderate pain or headache.       allopurinol (ZYLOPRIM) 100 MG tablet Take 100 mg by mouth Every Tuesday,Thursday,and Saturday with dialysis. After hemodialysis       B Complex-C-Zn-Folic Acid (DIALYVITE 800-ZINC 15) 0.8 MG TABS Take 1 tablet by mouth daily with supper.       Blood Glucose Monitoring Suppl (ACCU-CHEK GUIDE) w/Device KIT         calcium acetate (PHOSLO) 667 MG capsule Take 1,334 mg by mouth 3 (three) times daily with meals.   3   carvedilol (COREG) 25 MG tablet Take 25 mg by mouth in the morning and at bedtime.        Continuous Glucose Receiver (DEXCOM G7 RECEIVER) DEVI Use to monitor blood sugar. 1 each 0   Continuous Glucose Sensor (DEXCOM G7 SENSOR) MISC Change every 10 days 3 each 2   famotidine (PEPCID) 20 MG tablet Take 20 mg by mouth daily.       guaifenesin (ROBITUSSIN) 100 MG/5ML syrup Take 100 mg by mouth 3 (three) times daily as needed for cough.        insulin glargine (LANTUS SOLOSTAR) 100 UNIT/ML Solostar Pen INJECT 30 UNITS INTO THE SKIN DAILY. ADJUST AS DIRECTED 45 mL 1   insulin lispro (HUMALOG KWIKPEN) 100 UNIT/ML KwikPen 18 units acbf 15 mL 1   Insulin Pen Needle 31G X 5 MM MISC Use for insulin 50 each 1   insulin regular (NOVOLIN R) 100 units/mL injection 25 Units.  loratadine (CLARITIN) 10 MG tablet Take 10 mg by mouth daily.       meclizine (ANTIVERT) 25 MG tablet Take by mouth.       RELION INSULIN SYR 0.5ML/31G 31G X 5/16" 0.5 ML MISC USE 4 times DAILY 100 each 2   rosuvastatin (CRESTOR) 10 MG tablet Take 10 mg by mouth in the morning.       SYMBICORT 160-4.5 MCG/ACT inhaler Inhale 2 puffs into the lungs 2 (two) times daily.                   Current Facility-Administered Medications  Medication Dose Route Frequency Provider Last Rate Last Admin   0.9 %  sodium chloride infusion  250 mL Intravenous PRN Maeola Harman, MD       sodium chloride flush (NS) 0.9 % injection 3 mL  3 mL Intravenous Q12H Maeola Harman, MD       sodium chloride flush (NS) 0.9 % injection 3 mL  3 mL Intravenous PRN Maeola Harman, MD            Review of Systems  Constitutional:  Constitutional negative. HENT: HENT negative.  Eyes: Eyes negative.  Respiratory: Respiratory negative.  Cardiovascular: Cardiovascular negative.  GI: Gastrointestinal negative.  Musculoskeletal: Musculoskeletal negative.  Skin: Skin negative.  Neurological: Neurological negative. Hematologic: Hematologic/lymphatic negative.  Psychiatric: Psychiatric negative.          Objective:    Vitals:   03/07/24 1024 03/07/24 1348  BP: (!) 180/95   Pulse: 98 84  Resp: 18 16  Temp: 98.9 F (37.2 C)   SpO2: 100%       Physical Exam HENT:     Mouth/Throat:     Mouth: Mucous membranes are moist.  Eyes:     Pupils: Pupils are equal, round, and reactive to light.  Cardiovascular:     Pulses:          Radial pulses are 2+  on the right side and 2+ on the left side.       Dorsalis pedis pulses are 2+ on the right side and 2+ on the left side.  Pulmonary:     Effort: Pulmonary effort is normal.  Abdominal:     General: Abdomen is flat.  Musculoskeletal:        General: Normal range of motion.     Right lower leg: No edema.     Left lower leg: No edema.  Skin:    General: Skin is warm.     Capillary Refill: Capillary refill takes less than 2 seconds.  Neurological:     General: No focal deficit present.     Mental Status: She is alert.  Psychiatric:        Mood and Affect: Mood normal.           Assessment/Plan:   83 year old female with end-stage renal disease currently dialyzing via catheter.  Given her multiple previous bilateral upper extremity access procedures were performed to bilateral upper extremity venography.  It appears that she has single axillary vein and there is a strong palpable brachial pulse above the antecubital and should be suitable for graft.  We have discussed that this is high risk for failure and she demonstrates good understanding.  All questions were answered in the presence of her family.   Donovin Kraemer C. Randie Heinz, MD [facility] [place] Office: [phone] Pager: [phone]

## 2024-03-08 ENCOUNTER — Other Ambulatory Visit: Payer: Self-pay | Admitting: "Endocrinology

## 2024-03-08 DIAGNOSIS — Z794 Long term (current) use of insulin: Secondary | ICD-10-CM

## 2024-03-09 NOTE — Anesthesia Postprocedure Evaluation (Signed)
 Anesthesia Post Note  Patient: Katie Garcia  Procedure(s) Performed: INSERTION OF ARTERIOVENOUS (AV) GORE-TEX GRAFT ARM (Right: Arm Upper)     Patient location during evaluation: PACU Anesthesia Type: General Level of consciousness: awake and alert Pain management: pain level controlled Vital Signs Assessment: post-procedure vital signs reviewed and stable Respiratory status: spontaneous breathing, nonlabored ventilation, respiratory function stable and patient connected to nasal cannula oxygen Cardiovascular status: blood pressure returned to baseline and stable Postop Assessment: no apparent nausea or vomiting Anesthetic complications: no   No notable events documented.  Last Vitals:  Vitals:   03/07/24 2015 03/07/24 2025  BP: (!) 176/75 (!) 171/82  Pulse: 88 84  Resp: 16 18  Temp:  36.9 C  SpO2: 96% 96%    Last Pain:  Vitals:   03/07/24 2025  TempSrc:   PainSc: 0-No pain                 Kennieth Rad

## 2024-03-10 ENCOUNTER — Other Ambulatory Visit: Payer: Self-pay

## 2024-03-10 ENCOUNTER — Encounter (HOSPITAL_COMMUNITY): Payer: Self-pay | Admitting: Vascular Surgery

## 2024-03-10 NOTE — Telephone Encounter (Signed)
 Requested Prescriptions   Pending Prescriptions Disp Refills   Continuous Glucose Sensor (DEXCOM G7 SENSOR) MISC [Pharmacy Med Name: DEXCOM G7 SENSOR] 3 each 2    Sig: CHANGE EVERY 10 DAYS

## 2024-03-11 DIAGNOSIS — T8579XA Infection and inflammatory reaction due to other internal prosthetic devices, implants and grafts, initial encounter: Secondary | ICD-10-CM | POA: Diagnosis not present

## 2024-03-11 DIAGNOSIS — D631 Anemia in chronic kidney disease: Secondary | ICD-10-CM | POA: Diagnosis not present

## 2024-03-11 DIAGNOSIS — D688 Other specified coagulation defects: Secondary | ICD-10-CM | POA: Diagnosis not present

## 2024-03-11 DIAGNOSIS — N2581 Secondary hyperparathyroidism of renal origin: Secondary | ICD-10-CM | POA: Diagnosis not present

## 2024-03-11 DIAGNOSIS — R519 Headache, unspecified: Secondary | ICD-10-CM | POA: Diagnosis not present

## 2024-03-11 DIAGNOSIS — N186 End stage renal disease: Secondary | ICD-10-CM | POA: Diagnosis not present

## 2024-03-11 DIAGNOSIS — E1129 Type 2 diabetes mellitus with other diabetic kidney complication: Secondary | ICD-10-CM | POA: Diagnosis not present

## 2024-03-11 DIAGNOSIS — Z992 Dependence on renal dialysis: Secondary | ICD-10-CM | POA: Diagnosis not present

## 2024-03-12 ENCOUNTER — Ambulatory Visit: Payer: Medicare Other | Admitting: "Endocrinology

## 2024-03-12 ENCOUNTER — Encounter: Admitting: Vascular Surgery

## 2024-03-13 ENCOUNTER — Telehealth (HOSPITAL_COMMUNITY): Payer: Self-pay

## 2024-03-13 DIAGNOSIS — E1129 Type 2 diabetes mellitus with other diabetic kidney complication: Secondary | ICD-10-CM | POA: Diagnosis not present

## 2024-03-13 DIAGNOSIS — D631 Anemia in chronic kidney disease: Secondary | ICD-10-CM | POA: Diagnosis not present

## 2024-03-13 DIAGNOSIS — R519 Headache, unspecified: Secondary | ICD-10-CM | POA: Diagnosis not present

## 2024-03-13 DIAGNOSIS — T8579XA Infection and inflammatory reaction due to other internal prosthetic devices, implants and grafts, initial encounter: Secondary | ICD-10-CM | POA: Diagnosis not present

## 2024-03-13 DIAGNOSIS — N2581 Secondary hyperparathyroidism of renal origin: Secondary | ICD-10-CM | POA: Diagnosis not present

## 2024-03-13 DIAGNOSIS — N186 End stage renal disease: Secondary | ICD-10-CM | POA: Diagnosis not present

## 2024-03-13 DIAGNOSIS — Z992 Dependence on renal dialysis: Secondary | ICD-10-CM | POA: Diagnosis not present

## 2024-03-13 DIAGNOSIS — D688 Other specified coagulation defects: Secondary | ICD-10-CM | POA: Diagnosis not present

## 2024-03-13 NOTE — Telephone Encounter (Signed)
 Triage: -Received message from [place] at [facility] stating the pt's new graft was clotted off -consulted w/ Dr. Randie Heinz- schedule appt for her in the office next week -returned call to Brianna- LM  -schedule pt for next week

## 2024-03-14 DIAGNOSIS — D688 Other specified coagulation defects: Secondary | ICD-10-CM | POA: Diagnosis not present

## 2024-03-14 DIAGNOSIS — T8579XA Infection and inflammatory reaction due to other internal prosthetic devices, implants and grafts, initial encounter: Secondary | ICD-10-CM | POA: Diagnosis not present

## 2024-03-14 DIAGNOSIS — D631 Anemia in chronic kidney disease: Secondary | ICD-10-CM | POA: Diagnosis not present

## 2024-03-14 DIAGNOSIS — N2581 Secondary hyperparathyroidism of renal origin: Secondary | ICD-10-CM | POA: Diagnosis not present

## 2024-03-14 DIAGNOSIS — E1129 Type 2 diabetes mellitus with other diabetic kidney complication: Secondary | ICD-10-CM | POA: Diagnosis not present

## 2024-03-14 DIAGNOSIS — R519 Headache, unspecified: Secondary | ICD-10-CM | POA: Diagnosis not present

## 2024-03-14 DIAGNOSIS — Z992 Dependence on renal dialysis: Secondary | ICD-10-CM | POA: Diagnosis not present

## 2024-03-14 DIAGNOSIS — N186 End stage renal disease: Secondary | ICD-10-CM | POA: Diagnosis not present

## 2024-03-15 DIAGNOSIS — N2581 Secondary hyperparathyroidism of renal origin: Secondary | ICD-10-CM | POA: Diagnosis not present

## 2024-03-15 DIAGNOSIS — R519 Headache, unspecified: Secondary | ICD-10-CM | POA: Diagnosis not present

## 2024-03-15 DIAGNOSIS — D688 Other specified coagulation defects: Secondary | ICD-10-CM | POA: Diagnosis not present

## 2024-03-15 DIAGNOSIS — T8579XA Infection and inflammatory reaction due to other internal prosthetic devices, implants and grafts, initial encounter: Secondary | ICD-10-CM | POA: Diagnosis not present

## 2024-03-15 DIAGNOSIS — D631 Anemia in chronic kidney disease: Secondary | ICD-10-CM | POA: Diagnosis not present

## 2024-03-15 DIAGNOSIS — N186 End stage renal disease: Secondary | ICD-10-CM | POA: Diagnosis not present

## 2024-03-15 DIAGNOSIS — Z992 Dependence on renal dialysis: Secondary | ICD-10-CM | POA: Diagnosis not present

## 2024-03-15 DIAGNOSIS — E1129 Type 2 diabetes mellitus with other diabetic kidney complication: Secondary | ICD-10-CM | POA: Diagnosis not present

## 2024-03-18 DIAGNOSIS — D688 Other specified coagulation defects: Secondary | ICD-10-CM | POA: Diagnosis not present

## 2024-03-18 DIAGNOSIS — D631 Anemia in chronic kidney disease: Secondary | ICD-10-CM | POA: Diagnosis not present

## 2024-03-18 DIAGNOSIS — T8579XA Infection and inflammatory reaction due to other internal prosthetic devices, implants and grafts, initial encounter: Secondary | ICD-10-CM | POA: Diagnosis not present

## 2024-03-18 DIAGNOSIS — N2581 Secondary hyperparathyroidism of renal origin: Secondary | ICD-10-CM | POA: Diagnosis not present

## 2024-03-18 DIAGNOSIS — Z992 Dependence on renal dialysis: Secondary | ICD-10-CM | POA: Diagnosis not present

## 2024-03-18 DIAGNOSIS — E1129 Type 2 diabetes mellitus with other diabetic kidney complication: Secondary | ICD-10-CM | POA: Diagnosis not present

## 2024-03-18 DIAGNOSIS — R519 Headache, unspecified: Secondary | ICD-10-CM | POA: Diagnosis not present

## 2024-03-18 DIAGNOSIS — N186 End stage renal disease: Secondary | ICD-10-CM | POA: Diagnosis not present

## 2024-03-19 ENCOUNTER — Ambulatory Visit (INDEPENDENT_AMBULATORY_CARE_PROVIDER_SITE_OTHER): Admitting: Vascular Surgery

## 2024-03-19 ENCOUNTER — Encounter: Payer: Self-pay | Admitting: Vascular Surgery

## 2024-03-19 VITALS — BP 160/74 | HR 103 | Temp 98.4°F | Ht 60.0 in | Wt 196.0 lb

## 2024-03-19 DIAGNOSIS — N186 End stage renal disease: Secondary | ICD-10-CM

## 2024-03-19 NOTE — Progress Notes (Signed)
     Subjective:     Patient ID: Katie Garcia, female   DOB: 1941/07/27, 83 y.o.   MRN: [id number]  HPI 83 year old female status post right upper arm axillary artery to axillary vein loop graft.  She was having warmth and erythema to the right arm graft site and has been started on dialysis with antibiotics and states that this is now improving.  She denies any fevers or chills.  Her right hand is feeling fine.   Review of Systems As above    Objective:   Physical Exam Vitals:   03/19/24 1443  BP: (!) 160/74  Pulse: (!) 103  Temp: 98.4 F (36.9 C)  SpO2: 97%   Awake alert and oriented Nonlabored respirations Right arm does have some warmth overlying the counterincision, the axillary incision on the right is healing well There is no palpable thrill but there is strong Doppler signal in the right arm AV graft     Assessment/plan     83 year old female now status post right upper arm AV graft.  There was evidence of superficial cellulitis versus infection but the graft does not appear overtly infected and she is currently on antibiotics.  The graft is patent by Doppler but is difficult to palpate a thrill given the edema of the arm.  I will have her follow-up in a couple weeks for wound check and possibly she will require fistulogram prior to cannulation.  If she has ongoing concerns for infection she may require operative intervention with either washout or excision of the graft.  This was discussed with the patient and her daughter they demonstrate good understanding.    Braxtyn Bojarski C. Randie Heinz, MD [facility] [place] Office: [phone] Pager: [phone]

## 2024-03-20 DIAGNOSIS — T8579XA Infection and inflammatory reaction due to other internal prosthetic devices, implants and grafts, initial encounter: Secondary | ICD-10-CM | POA: Diagnosis not present

## 2024-03-20 DIAGNOSIS — N2581 Secondary hyperparathyroidism of renal origin: Secondary | ICD-10-CM | POA: Diagnosis not present

## 2024-03-20 DIAGNOSIS — Z992 Dependence on renal dialysis: Secondary | ICD-10-CM | POA: Diagnosis not present

## 2024-03-20 DIAGNOSIS — D688 Other specified coagulation defects: Secondary | ICD-10-CM | POA: Diagnosis not present

## 2024-03-20 DIAGNOSIS — R519 Headache, unspecified: Secondary | ICD-10-CM | POA: Diagnosis not present

## 2024-03-20 DIAGNOSIS — E1129 Type 2 diabetes mellitus with other diabetic kidney complication: Secondary | ICD-10-CM | POA: Diagnosis not present

## 2024-03-20 DIAGNOSIS — D631 Anemia in chronic kidney disease: Secondary | ICD-10-CM | POA: Diagnosis not present

## 2024-03-20 DIAGNOSIS — N186 End stage renal disease: Secondary | ICD-10-CM | POA: Diagnosis not present

## 2024-03-22 DIAGNOSIS — N186 End stage renal disease: Secondary | ICD-10-CM | POA: Diagnosis not present

## 2024-03-22 DIAGNOSIS — N2581 Secondary hyperparathyroidism of renal origin: Secondary | ICD-10-CM | POA: Diagnosis not present

## 2024-03-22 DIAGNOSIS — D631 Anemia in chronic kidney disease: Secondary | ICD-10-CM | POA: Diagnosis not present

## 2024-03-22 DIAGNOSIS — R519 Headache, unspecified: Secondary | ICD-10-CM | POA: Diagnosis not present

## 2024-03-22 DIAGNOSIS — T8579XA Infection and inflammatory reaction due to other internal prosthetic devices, implants and grafts, initial encounter: Secondary | ICD-10-CM | POA: Diagnosis not present

## 2024-03-22 DIAGNOSIS — D688 Other specified coagulation defects: Secondary | ICD-10-CM | POA: Diagnosis not present

## 2024-03-22 DIAGNOSIS — Z992 Dependence on renal dialysis: Secondary | ICD-10-CM | POA: Diagnosis not present

## 2024-03-22 DIAGNOSIS — E1129 Type 2 diabetes mellitus with other diabetic kidney complication: Secondary | ICD-10-CM | POA: Diagnosis not present

## 2024-03-24 DIAGNOSIS — E1122 Type 2 diabetes mellitus with diabetic chronic kidney disease: Secondary | ICD-10-CM | POA: Diagnosis not present

## 2024-03-24 DIAGNOSIS — Z992 Dependence on renal dialysis: Secondary | ICD-10-CM | POA: Diagnosis not present

## 2024-03-24 DIAGNOSIS — N186 End stage renal disease: Secondary | ICD-10-CM | POA: Diagnosis not present

## 2024-03-25 DIAGNOSIS — D509 Iron deficiency anemia, unspecified: Secondary | ICD-10-CM | POA: Diagnosis not present

## 2024-03-25 DIAGNOSIS — D631 Anemia in chronic kidney disease: Secondary | ICD-10-CM | POA: Diagnosis not present

## 2024-03-25 DIAGNOSIS — N2581 Secondary hyperparathyroidism of renal origin: Secondary | ICD-10-CM | POA: Diagnosis not present

## 2024-03-25 DIAGNOSIS — R519 Headache, unspecified: Secondary | ICD-10-CM | POA: Diagnosis not present

## 2024-03-25 DIAGNOSIS — Z992 Dependence on renal dialysis: Secondary | ICD-10-CM | POA: Diagnosis not present

## 2024-03-25 DIAGNOSIS — E1129 Type 2 diabetes mellitus with other diabetic kidney complication: Secondary | ICD-10-CM | POA: Diagnosis not present

## 2024-03-25 DIAGNOSIS — D688 Other specified coagulation defects: Secondary | ICD-10-CM | POA: Diagnosis not present

## 2024-03-25 DIAGNOSIS — N186 End stage renal disease: Secondary | ICD-10-CM | POA: Diagnosis not present

## 2024-03-27 DIAGNOSIS — Z992 Dependence on renal dialysis: Secondary | ICD-10-CM | POA: Diagnosis not present

## 2024-03-27 DIAGNOSIS — D688 Other specified coagulation defects: Secondary | ICD-10-CM | POA: Diagnosis not present

## 2024-03-27 DIAGNOSIS — E1129 Type 2 diabetes mellitus with other diabetic kidney complication: Secondary | ICD-10-CM | POA: Diagnosis not present

## 2024-03-27 DIAGNOSIS — N2581 Secondary hyperparathyroidism of renal origin: Secondary | ICD-10-CM | POA: Diagnosis not present

## 2024-03-27 DIAGNOSIS — R519 Headache, unspecified: Secondary | ICD-10-CM | POA: Diagnosis not present

## 2024-03-27 DIAGNOSIS — D631 Anemia in chronic kidney disease: Secondary | ICD-10-CM | POA: Diagnosis not present

## 2024-03-27 DIAGNOSIS — N186 End stage renal disease: Secondary | ICD-10-CM | POA: Diagnosis not present

## 2024-03-27 DIAGNOSIS — D509 Iron deficiency anemia, unspecified: Secondary | ICD-10-CM | POA: Diagnosis not present

## 2024-03-29 DIAGNOSIS — N2581 Secondary hyperparathyroidism of renal origin: Secondary | ICD-10-CM | POA: Diagnosis not present

## 2024-03-29 DIAGNOSIS — D688 Other specified coagulation defects: Secondary | ICD-10-CM | POA: Diagnosis not present

## 2024-03-29 DIAGNOSIS — Z992 Dependence on renal dialysis: Secondary | ICD-10-CM | POA: Diagnosis not present

## 2024-03-29 DIAGNOSIS — D631 Anemia in chronic kidney disease: Secondary | ICD-10-CM | POA: Diagnosis not present

## 2024-03-29 DIAGNOSIS — N186 End stage renal disease: Secondary | ICD-10-CM | POA: Diagnosis not present

## 2024-03-29 DIAGNOSIS — E1129 Type 2 diabetes mellitus with other diabetic kidney complication: Secondary | ICD-10-CM | POA: Diagnosis not present

## 2024-03-29 DIAGNOSIS — D509 Iron deficiency anemia, unspecified: Secondary | ICD-10-CM | POA: Diagnosis not present

## 2024-03-29 DIAGNOSIS — R519 Headache, unspecified: Secondary | ICD-10-CM | POA: Diagnosis not present

## 2024-04-01 DIAGNOSIS — Z992 Dependence on renal dialysis: Secondary | ICD-10-CM | POA: Diagnosis not present

## 2024-04-01 DIAGNOSIS — E1129 Type 2 diabetes mellitus with other diabetic kidney complication: Secondary | ICD-10-CM | POA: Diagnosis not present

## 2024-04-01 DIAGNOSIS — D688 Other specified coagulation defects: Secondary | ICD-10-CM | POA: Diagnosis not present

## 2024-04-01 DIAGNOSIS — D509 Iron deficiency anemia, unspecified: Secondary | ICD-10-CM | POA: Diagnosis not present

## 2024-04-01 DIAGNOSIS — D631 Anemia in chronic kidney disease: Secondary | ICD-10-CM | POA: Diagnosis not present

## 2024-04-01 DIAGNOSIS — R519 Headache, unspecified: Secondary | ICD-10-CM | POA: Diagnosis not present

## 2024-04-01 DIAGNOSIS — N2581 Secondary hyperparathyroidism of renal origin: Secondary | ICD-10-CM | POA: Diagnosis not present

## 2024-04-01 DIAGNOSIS — N186 End stage renal disease: Secondary | ICD-10-CM | POA: Diagnosis not present

## 2024-04-02 ENCOUNTER — Ambulatory Visit (INDEPENDENT_AMBULATORY_CARE_PROVIDER_SITE_OTHER): Admitting: Physician Assistant

## 2024-04-02 ENCOUNTER — Encounter: Payer: Self-pay | Admitting: Physician Assistant

## 2024-04-02 VITALS — BP 151/71 | HR 94 | Temp 98.3°F | Ht 60.0 in | Wt 194.7 lb

## 2024-04-02 DIAGNOSIS — N186 End stage renal disease: Secondary | ICD-10-CM

## 2024-04-02 NOTE — Progress Notes (Unsigned)
  POST OPERATIVE OFFICE NOTE    CC:  F/u for surgery  HPI:  This is a 83 y.o. female who is s/p *** on *** by Dr. Marland Kitchen    Pt returns today for follow up.  Pt states ***   Allergies  Allergen Reactions   Penicillins Rash    Did it involve swelling of the face/tongue/throat, SOB, or low BP? No Did it involve sudden or severe rash/hives, skin peeling, or any reaction on the inside of your mouth or nose? Yes Did you need to seek medical attention at a hospital or doctor's office? N/A When did it last happen? N/A      If all above answers are "NO", may proceed with cephalosporin use.    Current Outpatient Medications  Medication Sig Dispense Refill   Accu-Chek FastClix Lancets MISC      ACCU-CHEK GUIDE test strip 1 each by Other route in the morning and at bedtime. E11.22  1   acetaminophen (TYLENOL) 500 MG tablet Take 500-1,000 mg by mouth every 6 (six) hours as needed for moderate pain or headache.     B Complex-C-Zn-Folic Acid (DIALYVITE 800-ZINC 15) 0.8 MG TABS Take 1 tablet by mouth daily with supper.     Blood Glucose Monitoring Suppl (ACCU-CHEK GUIDE) w/Device KIT      calcium acetate (PHOSLO) 667 MG capsule Take 1,334 mg by mouth 3 (three) times daily with meals.  3   carvedilol (COREG) 12.5 MG tablet Take 6.25 mg by mouth in the morning and at bedtime.     Continuous Glucose Receiver (DEXCOM G7 RECEIVER) DEVI Use to monitor blood sugar. 1 each 0   Continuous Glucose Sensor (DEXCOM G7 SENSOR) MISC CHANGE EVERY 10 DAYS 3 each 2   guaifenesin (ROBITUSSIN) 100 MG/5ML syrup Take 100 mg by mouth 3 (three) times daily as needed for cough.     insulin glargine (LANTUS SOLOSTAR) 100 UNIT/ML Solostar Pen INJECT 30 UNITS INTO THE SKIN DAILY. ADJUST AS DIRECTED (Patient taking differently: Inject 20 Units into the skin daily. Adjust as directed) 45 mL 1   Insulin Pen Needle 31G X 5 MM MISC Use for insulin 50 each 1   insulin regular (NOVOLIN R) 100 units/mL injection Inject 22 Units into  the skin 3 (three) times daily before meals.     loratadine (CLARITIN) 10 MG tablet Take 10 mg by mouth daily.     RELION INSULIN SYR 0.5ML/31G 31G X 5/16" 0.5 ML MISC USE 4 times DAILY 100 each 2   rosuvastatin (CRESTOR) 10 MG tablet Take 10 mg by mouth in the morning.     SYMBICORT 160-4.5 MCG/ACT inhaler Inhale 2 puffs into the lungs 2 (two) times daily.     oxyCODONE-acetaminophen (PERCOCET) 5-325 MG tablet Take 1 tablet by mouth every 8 (eight) hours as needed. (Patient not taking: Reported on 04/02/2024) 12 tablet 0   Current Facility-Administered Medications  Medication Dose Route Frequency Provider Last Rate Last Admin   0.9 %  sodium chloride infusion  250 mL Intravenous PRN Maeola Harman, MD         ROS:  See HPI  Physical Exam:  ***  Incision:  *** Extremities:  *** Neuro: *** Abdomen:  ***    Assessment/Plan:  This is a 83 y.o. female who is s/p: ***  -***   Loel Dubonnet, PA-C [facility] [phone]   Clinic MD:  ***

## 2024-04-02 NOTE — H&P (View-Only) (Signed)
  POST OPERATIVE OFFICE NOTE    CC:  F/u for surgery  HPI:  This is a 83 y.o. female who is s/p *** on *** by Dr. Marland Kitchen    Pt returns today for follow up.  Pt states ***   Allergies  Allergen Reactions   Penicillins Rash    Did it involve swelling of the face/tongue/throat, SOB, or low BP? No Did it involve sudden or severe rash/hives, skin peeling, or any reaction on the inside of your mouth or nose? Yes Did you need to seek medical attention at a hospital or doctor's office? N/A When did it last happen? N/A      If all above answers are "NO", may proceed with cephalosporin use.    Current Outpatient Medications  Medication Sig Dispense Refill   Accu-Chek FastClix Lancets MISC      ACCU-CHEK GUIDE test strip 1 each by Other route in the morning and at bedtime. E11.22  1   acetaminophen (TYLENOL) 500 MG tablet Take 500-1,000 mg by mouth every 6 (six) hours as needed for moderate pain or headache.     B Complex-C-Zn-Folic Acid (DIALYVITE 800-ZINC 15) 0.8 MG TABS Take 1 tablet by mouth daily with supper.     Blood Glucose Monitoring Suppl (ACCU-CHEK GUIDE) w/Device KIT      calcium acetate (PHOSLO) 667 MG capsule Take 1,334 mg by mouth 3 (three) times daily with meals.  3   carvedilol (COREG) 12.5 MG tablet Take 6.25 mg by mouth in the morning and at bedtime.     Continuous Glucose Receiver (DEXCOM G7 RECEIVER) DEVI Use to monitor blood sugar. 1 each 0   Continuous Glucose Sensor (DEXCOM G7 SENSOR) MISC CHANGE EVERY 10 DAYS 3 each 2   guaifenesin (ROBITUSSIN) 100 MG/5ML syrup Take 100 mg by mouth 3 (three) times daily as needed for cough.     insulin glargine (LANTUS SOLOSTAR) 100 UNIT/ML Solostar Pen INJECT 30 UNITS INTO THE SKIN DAILY. ADJUST AS DIRECTED (Patient taking differently: Inject 20 Units into the skin daily. Adjust as directed) 45 mL 1   Insulin Pen Needle 31G X 5 MM MISC Use for insulin 50 each 1   insulin regular (NOVOLIN R) 100 units/mL injection Inject 22 Units into  the skin 3 (three) times daily before meals.     loratadine (CLARITIN) 10 MG tablet Take 10 mg by mouth daily.     RELION INSULIN SYR 0.5ML/31G 31G X 5/16" 0.5 ML MISC USE 4 times DAILY 100 each 2   rosuvastatin (CRESTOR) 10 MG tablet Take 10 mg by mouth in the morning.     SYMBICORT 160-4.5 MCG/ACT inhaler Inhale 2 puffs into the lungs 2 (two) times daily.     oxyCODONE-acetaminophen (PERCOCET) 5-325 MG tablet Take 1 tablet by mouth every 8 (eight) hours as needed. (Patient not taking: Reported on 04/02/2024) 12 tablet 0   Current Facility-Administered Medications  Medication Dose Route Frequency Provider Last Rate Last Admin   0.9 %  sodium chloride infusion  250 mL Intravenous PRN Maeola Harman, MD         ROS:  See HPI  Physical Exam:  ***  Incision:  *** Extremities:  *** Neuro: *** Abdomen:  ***    Assessment/Plan:  This is a 83 y.o. female who is s/p: ***  -***   Loel Dubonnet, PA-C [facility] [phone]   Clinic MD:  ***

## 2024-04-03 ENCOUNTER — Other Ambulatory Visit: Payer: Self-pay

## 2024-04-03 DIAGNOSIS — N2581 Secondary hyperparathyroidism of renal origin: Secondary | ICD-10-CM | POA: Diagnosis not present

## 2024-04-03 DIAGNOSIS — E1129 Type 2 diabetes mellitus with other diabetic kidney complication: Secondary | ICD-10-CM | POA: Diagnosis not present

## 2024-04-03 DIAGNOSIS — D688 Other specified coagulation defects: Secondary | ICD-10-CM | POA: Diagnosis not present

## 2024-04-03 DIAGNOSIS — D509 Iron deficiency anemia, unspecified: Secondary | ICD-10-CM | POA: Diagnosis not present

## 2024-04-03 DIAGNOSIS — R519 Headache, unspecified: Secondary | ICD-10-CM | POA: Diagnosis not present

## 2024-04-03 DIAGNOSIS — N186 End stage renal disease: Secondary | ICD-10-CM

## 2024-04-03 DIAGNOSIS — D631 Anemia in chronic kidney disease: Secondary | ICD-10-CM | POA: Diagnosis not present

## 2024-04-03 DIAGNOSIS — Z992 Dependence on renal dialysis: Secondary | ICD-10-CM | POA: Diagnosis not present

## 2024-04-05 DIAGNOSIS — D509 Iron deficiency anemia, unspecified: Secondary | ICD-10-CM | POA: Diagnosis not present

## 2024-04-05 DIAGNOSIS — N2581 Secondary hyperparathyroidism of renal origin: Secondary | ICD-10-CM | POA: Diagnosis not present

## 2024-04-05 DIAGNOSIS — E1129 Type 2 diabetes mellitus with other diabetic kidney complication: Secondary | ICD-10-CM | POA: Diagnosis not present

## 2024-04-05 DIAGNOSIS — Z992 Dependence on renal dialysis: Secondary | ICD-10-CM | POA: Diagnosis not present

## 2024-04-05 DIAGNOSIS — N186 End stage renal disease: Secondary | ICD-10-CM | POA: Diagnosis not present

## 2024-04-05 DIAGNOSIS — R519 Headache, unspecified: Secondary | ICD-10-CM | POA: Diagnosis not present

## 2024-04-05 DIAGNOSIS — D631 Anemia in chronic kidney disease: Secondary | ICD-10-CM | POA: Diagnosis not present

## 2024-04-05 DIAGNOSIS — D688 Other specified coagulation defects: Secondary | ICD-10-CM | POA: Diagnosis not present

## 2024-04-08 DIAGNOSIS — N186 End stage renal disease: Secondary | ICD-10-CM | POA: Diagnosis not present

## 2024-04-08 DIAGNOSIS — D631 Anemia in chronic kidney disease: Secondary | ICD-10-CM | POA: Diagnosis not present

## 2024-04-08 DIAGNOSIS — D688 Other specified coagulation defects: Secondary | ICD-10-CM | POA: Diagnosis not present

## 2024-04-08 DIAGNOSIS — Z992 Dependence on renal dialysis: Secondary | ICD-10-CM | POA: Diagnosis not present

## 2024-04-08 DIAGNOSIS — R519 Headache, unspecified: Secondary | ICD-10-CM | POA: Diagnosis not present

## 2024-04-08 DIAGNOSIS — D509 Iron deficiency anemia, unspecified: Secondary | ICD-10-CM | POA: Diagnosis not present

## 2024-04-08 DIAGNOSIS — E1129 Type 2 diabetes mellitus with other diabetic kidney complication: Secondary | ICD-10-CM | POA: Diagnosis not present

## 2024-04-08 DIAGNOSIS — N2581 Secondary hyperparathyroidism of renal origin: Secondary | ICD-10-CM | POA: Diagnosis not present

## 2024-04-10 DIAGNOSIS — R519 Headache, unspecified: Secondary | ICD-10-CM | POA: Diagnosis not present

## 2024-04-10 DIAGNOSIS — D631 Anemia in chronic kidney disease: Secondary | ICD-10-CM | POA: Diagnosis not present

## 2024-04-10 DIAGNOSIS — D688 Other specified coagulation defects: Secondary | ICD-10-CM | POA: Diagnosis not present

## 2024-04-10 DIAGNOSIS — D509 Iron deficiency anemia, unspecified: Secondary | ICD-10-CM | POA: Diagnosis not present

## 2024-04-10 DIAGNOSIS — N186 End stage renal disease: Secondary | ICD-10-CM | POA: Diagnosis not present

## 2024-04-10 DIAGNOSIS — N2581 Secondary hyperparathyroidism of renal origin: Secondary | ICD-10-CM | POA: Diagnosis not present

## 2024-04-10 DIAGNOSIS — E1129 Type 2 diabetes mellitus with other diabetic kidney complication: Secondary | ICD-10-CM | POA: Diagnosis not present

## 2024-04-10 DIAGNOSIS — Z992 Dependence on renal dialysis: Secondary | ICD-10-CM | POA: Diagnosis not present

## 2024-04-12 DIAGNOSIS — D631 Anemia in chronic kidney disease: Secondary | ICD-10-CM | POA: Diagnosis not present

## 2024-04-12 DIAGNOSIS — E1129 Type 2 diabetes mellitus with other diabetic kidney complication: Secondary | ICD-10-CM | POA: Diagnosis not present

## 2024-04-12 DIAGNOSIS — Z992 Dependence on renal dialysis: Secondary | ICD-10-CM | POA: Diagnosis not present

## 2024-04-12 DIAGNOSIS — D509 Iron deficiency anemia, unspecified: Secondary | ICD-10-CM | POA: Diagnosis not present

## 2024-04-12 DIAGNOSIS — N2581 Secondary hyperparathyroidism of renal origin: Secondary | ICD-10-CM | POA: Diagnosis not present

## 2024-04-12 DIAGNOSIS — R519 Headache, unspecified: Secondary | ICD-10-CM | POA: Diagnosis not present

## 2024-04-12 DIAGNOSIS — D688 Other specified coagulation defects: Secondary | ICD-10-CM | POA: Diagnosis not present

## 2024-04-12 DIAGNOSIS — N186 End stage renal disease: Secondary | ICD-10-CM | POA: Diagnosis not present

## 2024-04-14 ENCOUNTER — Other Ambulatory Visit: Payer: Self-pay

## 2024-04-14 ENCOUNTER — Encounter (HOSPITAL_COMMUNITY): Payer: Self-pay | Admitting: Vascular Surgery

## 2024-04-14 ENCOUNTER — Encounter (HOSPITAL_COMMUNITY)
Admission: RE | Disposition: A | Discharge status: Home / Self Care | Payer: Self-pay | Source: Home / Self Care | Attending: Vascular Surgery

## 2024-04-14 ENCOUNTER — Ambulatory Visit (HOSPITAL_COMMUNITY)
Admission: RE | Admit: 2024-04-14 | Discharge: 2024-04-14 | Disposition: A | Discharge status: Home / Self Care | Attending: Vascular Surgery | Admitting: Vascular Surgery

## 2024-04-14 DIAGNOSIS — T82868A Thrombosis of vascular prosthetic devices, implants and grafts, initial encounter: Secondary | ICD-10-CM | POA: Insufficient documentation

## 2024-04-14 DIAGNOSIS — Z992 Dependence on renal dialysis: Secondary | ICD-10-CM | POA: Insufficient documentation

## 2024-04-14 DIAGNOSIS — N185 Chronic kidney disease, stage 5: Secondary | ICD-10-CM | POA: Diagnosis not present

## 2024-04-14 DIAGNOSIS — N186 End stage renal disease: Secondary | ICD-10-CM | POA: Insufficient documentation

## 2024-04-14 DIAGNOSIS — Y832 Surgical operation with anastomosis, bypass or graft as the cause of abnormal reaction of the patient, or of later complication, without mention of misadventure at the time of the procedure: Secondary | ICD-10-CM | POA: Insufficient documentation

## 2024-04-14 DIAGNOSIS — T82898A Other specified complication of vascular prosthetic devices, implants and grafts, initial encounter: Secondary | ICD-10-CM

## 2024-04-14 HISTORY — PX: A/V FISTULAGRAM: CATH118298

## 2024-04-14 LAB — POCT I-STAT, CHEM 8
BUN: 46 mg/dL — ABNORMAL HIGH (ref 8–23)
Calcium, Ion: 1.32 mmol/L (ref 1.15–1.40)
Chloride: 102 mmol/L (ref 98–111)
Creatinine, Ser: 8.9 mg/dL — ABNORMAL HIGH (ref 0.44–1.00)
Glucose, Bld: 130 mg/dL — ABNORMAL HIGH (ref 70–99)
HCT: 36 % (ref 36.0–46.0)
Hemoglobin: 12.2 g/dL (ref 12.0–15.0)
Potassium: 3.5 mmol/L (ref 3.5–5.1)
Sodium: 139 mmol/L (ref 135–145)
TCO2: 29 mmol/L (ref 22–32)

## 2024-04-14 SURGERY — A/V FISTULAGRAM
Anesthesia: LOCAL | Laterality: Right

## 2024-04-14 MED ORDER — IODIXANOL 320 MG/ML IV SOLN
INTRAVENOUS | Status: DC | PRN
Start: 2024-04-14 — End: 2024-04-14
  Administered 2024-04-14: 45 mL

## 2024-04-14 MED ORDER — LIDOCAINE HCL (PF) 1 % IJ SOLN
INTRAMUSCULAR | Status: AC
  Filled 2024-04-14: qty 30

## 2024-04-14 MED ORDER — HEPARIN SODIUM (PORCINE) 1000 UNIT/ML IJ SOLN
INTRAMUSCULAR | Status: DC | PRN
Start: 2024-04-14 — End: 2024-04-14
  Administered 2024-04-14: 8000 [IU] via INTRAVENOUS

## 2024-04-14 MED ORDER — SODIUM CHLORIDE 0.9% FLUSH: 3.0000 mL | INTRAVENOUS | Status: DC | PRN

## 2024-04-14 MED ORDER — LABETALOL HCL 5 MG/ML IV SOLN
INTRAVENOUS | Status: AC
  Filled 2024-04-14: qty 4

## 2024-04-14 MED ORDER — LIDOCAINE HCL (PF) 1 % IJ SOLN
INTRAMUSCULAR | Status: DC | PRN
  Administered 2024-04-14: 5 mL
  Administered 2024-04-14: 2 mL
  Administered 2024-04-14: 7 mL

## 2024-04-14 MED ORDER — HEPARIN (PORCINE) IN NACL 1000-0.9 UT/500ML-% IV SOLN
INTRAVENOUS | Status: DC | PRN
  Administered 2024-04-14: 500 mL

## 2024-04-14 MED ORDER — LABETALOL HCL 5 MG/ML IV SOLN
INTRAVENOUS | Status: DC | PRN
Start: 2024-04-14 — End: 2024-04-14
  Administered 2024-04-14: 10 mg via INTRAVENOUS

## 2024-04-14 SURGICAL SUPPLY — 24 items
BAG SNAP BAND KOVER 36X36 (MISCELLANEOUS) ×1 IMPLANT
BALLN MUSTANG 7X80X75 (BALLOONS) ×1 IMPLANT
BALLOON FOGARTY 5FR 40 (CATHETERS) IMPLANT
BALLOON MUSTANG 7X80X75 (BALLOONS) IMPLANT
CATH BEACON 5 .035 65 KMP TIP (CATHETERS) IMPLANT
CATH FOGARTY BALL 5FR 40 (CATHETERS) ×1 IMPLANT
COVER DOME SNAP 22 D (MISCELLANEOUS) ×1 IMPLANT
GUIDEWIRE ANGLED .035X150CM (WIRE) IMPLANT
KIT ENCORE 26 ADVANTAGE (KITS) IMPLANT
KIT MICROPUNCTURE NIT STIFF (SHEATH) IMPLANT
PACK CARDIAC CATHETERIZATION (CUSTOM PROCEDURE TRAY) IMPLANT
PROTECTION STATION PRESSURIZED (MISCELLANEOUS) ×1 IMPLANT
SHEATH PINNACLE 8F 10CM (SHEATH) IMPLANT
SHEATH PINNACLE R/O II 6F 4CM (SHEATH) IMPLANT
SHEATH PINNACLE R/O II 7F 4CM (SHEATH) IMPLANT
SHEATH PROBE COVER 6X72 (BAG) ×1 IMPLANT
STATION PROTECTION PRESSURIZED (MISCELLANEOUS) ×1 IMPLANT
STENT VIABAHN 7X50X75 (Permanent Stent) IMPLANT
STENT VIABAHN 8X100X75 (Permanent Stent) IMPLANT
STOPCOCK MORSE 400PSI 3WAY (MISCELLANEOUS) ×1 IMPLANT
TUBING CIL FLEX 10 FLL-RA (TUBING) ×1 IMPLANT
WIRE BENTSON .035X145CM (WIRE) IMPLANT
WIRE G V18X300CM (WIRE) IMPLANT
WIRE ROSEN-J .035X260CM (WIRE) IMPLANT

## 2024-04-14 NOTE — Op Note (Signed)
 Patient name: Katie Garcia Basic MRN: [id number] DOB: 1941-11-26 Sex: female  04/14/2024 Pre-operative Diagnosis: End-stage renal disease, malfunction right upper arm AV graft Post-operative diagnosis:  Same Surgeon:  Sarajane Cumming. Vikki Graves, MD Procedure Performed: 1.  Percutaneous ultrasound-guided access right upper arm AV graft and antegrade and retrograde directions with catheter selection of right axillary artery and right subclavian veins 2.  Right upper arm AV graft percutaneous thrombectomy using over-the-wire 5 Fogarty and 7 x 80 mm balloon 3.  Stent of right axillary vein with central 8 x 10 cm Viabahn and more peripheral 7 x 5 cm Viabahn postdilated with 7 mm balloon  Indications: 83 year old female with history of end-stage renal disease.  She has undergone bilateral upper extremity arteriovenous grafting which have all failed and most recently has a right upper arm axillary loop graft which was noted to be patent on most recent exam but on today's exam appears thrombosed.  She is not indicated for fistulogram with possible thrombectomy.  Findings: The graft was thrombosed however with minimal clot throughout the graft.  Arterial anastomosis remained patent.  The venous anastomosis was entirely thrombosed and was very diminutive.  After thrombectomy we stented the outflow and there was very brisk flow through the graft and a palpable radial artery pulse at the wrist.   Procedure:  The patient was identified in the holding area and taken to room 8.  The patient was then placed supine on the table and prepped and draped in the usual sterile fashion.  A time out was called.  Ultrasound was used to evaluate the graft which was noted to be flat and did not appear to have any thrombus.  I initially cannulated this with a micropuncture needle followed by wire and sheath using ultrasound guidance and an ultrasound image saved the permanent record.  I injected contrast and this really did not travel  consistent with graft thrombosis.  With that I use ultrasound-guided to cannulate towards the venous limb and again use the micropuncture needle followed by wire sheath.  Towards the arterial limb I placed a 6 French sheath and towards the venous limb an 8 [place] sheath.  The patient was fully heparinized at this time using 8000 units of heparin .  I then used a Kumpe catheter to cross the stenosed venous outflow using a Glidewire and confirmed intraluminal access and then placed a Rosen wire into the IVC.  Towards the arterial limb I was able to place a Kumpe catheter and used a Glidewire and confirmed arterial access in the axillary artery and then placed a Bentson wire.  I then used a 7 x 80 mm balloon going towards the venous limb and dilated this throughout the graft and then used this to dilate the venous anastomosis.  Over the Bentson wire through the 6 French sheath I then placed a 5 Fogarty and pulled the clot towards the graft.  I then had strong inflow.  I then primarily stented the axillary vein starting with a 7 x 5 cm Viabahn in the outflow extended with an 8 x 10 cm Viabahn into the axillary vein and this was postdilated with a 7 mm balloon.  Completion demonstrated brisk flow through the graft and there was also a palpable radial pulse.  Satisfied with this all the wires were removed and the cannulation sites were suture-ligated with 4 Monocryl.  Patient tolerated the procedure without immediate complication.  Contrast: 45cc   Keymani Glynn C. Vikki Graves, MD [facility]  Office: [phone] Pager: [phone]

## 2024-04-14 NOTE — Interval H&P Note (Signed)
 History and Physical Interval Note:  04/14/2024 9:20 AM  Katie Garcia  has presented today for surgery, with the diagnosis of instage renal.  The various methods of treatment have been discussed with the patient and family. After consideration of risks, benefits and other options for treatment, the patient has consented to  Procedure(s): A/V Fistulagram (Right) as a surgical intervention.  The patient's history has been reviewed, patient examined, no change in status, stable for surgery.  I have reviewed the patient's chart and labs.  Questions were answered to the patient's satisfaction.     Angela Kell

## 2024-04-14 NOTE — Progress Notes (Signed)
Patient and daughter was given discharge instruction. Both verbalized understanding. ?

## 2024-04-15 ENCOUNTER — Encounter (HOSPITAL_COMMUNITY): Payer: Self-pay | Admitting: Vascular Surgery

## 2024-04-15 DIAGNOSIS — D688 Other specified coagulation defects: Secondary | ICD-10-CM | POA: Diagnosis not present

## 2024-04-15 DIAGNOSIS — D631 Anemia in chronic kidney disease: Secondary | ICD-10-CM | POA: Diagnosis not present

## 2024-04-15 DIAGNOSIS — E1129 Type 2 diabetes mellitus with other diabetic kidney complication: Secondary | ICD-10-CM | POA: Diagnosis not present

## 2024-04-15 DIAGNOSIS — Z992 Dependence on renal dialysis: Secondary | ICD-10-CM | POA: Diagnosis not present

## 2024-04-15 DIAGNOSIS — N186 End stage renal disease: Secondary | ICD-10-CM | POA: Diagnosis not present

## 2024-04-15 DIAGNOSIS — D509 Iron deficiency anemia, unspecified: Secondary | ICD-10-CM | POA: Diagnosis not present

## 2024-04-15 DIAGNOSIS — N2581 Secondary hyperparathyroidism of renal origin: Secondary | ICD-10-CM | POA: Diagnosis not present

## 2024-04-15 DIAGNOSIS — R519 Headache, unspecified: Secondary | ICD-10-CM | POA: Diagnosis not present

## 2024-04-17 DIAGNOSIS — D631 Anemia in chronic kidney disease: Secondary | ICD-10-CM | POA: Diagnosis not present

## 2024-04-17 DIAGNOSIS — Z992 Dependence on renal dialysis: Secondary | ICD-10-CM | POA: Diagnosis not present

## 2024-04-17 DIAGNOSIS — N2581 Secondary hyperparathyroidism of renal origin: Secondary | ICD-10-CM | POA: Diagnosis not present

## 2024-04-17 DIAGNOSIS — E1129 Type 2 diabetes mellitus with other diabetic kidney complication: Secondary | ICD-10-CM | POA: Diagnosis not present

## 2024-04-17 DIAGNOSIS — D509 Iron deficiency anemia, unspecified: Secondary | ICD-10-CM | POA: Diagnosis not present

## 2024-04-17 DIAGNOSIS — D688 Other specified coagulation defects: Secondary | ICD-10-CM | POA: Diagnosis not present

## 2024-04-17 DIAGNOSIS — N186 End stage renal disease: Secondary | ICD-10-CM | POA: Diagnosis not present

## 2024-04-17 DIAGNOSIS — R519 Headache, unspecified: Secondary | ICD-10-CM | POA: Diagnosis not present

## 2024-04-19 DIAGNOSIS — D509 Iron deficiency anemia, unspecified: Secondary | ICD-10-CM | POA: Diagnosis not present

## 2024-04-19 DIAGNOSIS — D688 Other specified coagulation defects: Secondary | ICD-10-CM | POA: Diagnosis not present

## 2024-04-19 DIAGNOSIS — N186 End stage renal disease: Secondary | ICD-10-CM | POA: Diagnosis not present

## 2024-04-19 DIAGNOSIS — E1129 Type 2 diabetes mellitus with other diabetic kidney complication: Secondary | ICD-10-CM | POA: Diagnosis not present

## 2024-04-19 DIAGNOSIS — Z992 Dependence on renal dialysis: Secondary | ICD-10-CM | POA: Diagnosis not present

## 2024-04-19 DIAGNOSIS — D631 Anemia in chronic kidney disease: Secondary | ICD-10-CM | POA: Diagnosis not present

## 2024-04-19 DIAGNOSIS — N2581 Secondary hyperparathyroidism of renal origin: Secondary | ICD-10-CM | POA: Diagnosis not present

## 2024-04-19 DIAGNOSIS — R519 Headache, unspecified: Secondary | ICD-10-CM | POA: Diagnosis not present

## 2024-04-22 DIAGNOSIS — Z992 Dependence on renal dialysis: Secondary | ICD-10-CM | POA: Diagnosis not present

## 2024-04-22 DIAGNOSIS — R519 Headache, unspecified: Secondary | ICD-10-CM | POA: Diagnosis not present

## 2024-04-22 DIAGNOSIS — D509 Iron deficiency anemia, unspecified: Secondary | ICD-10-CM | POA: Diagnosis not present

## 2024-04-22 DIAGNOSIS — D631 Anemia in chronic kidney disease: Secondary | ICD-10-CM | POA: Diagnosis not present

## 2024-04-22 DIAGNOSIS — E1129 Type 2 diabetes mellitus with other diabetic kidney complication: Secondary | ICD-10-CM | POA: Diagnosis not present

## 2024-04-22 DIAGNOSIS — N186 End stage renal disease: Secondary | ICD-10-CM | POA: Diagnosis not present

## 2024-04-22 DIAGNOSIS — D688 Other specified coagulation defects: Secondary | ICD-10-CM | POA: Diagnosis not present

## 2024-04-22 DIAGNOSIS — N2581 Secondary hyperparathyroidism of renal origin: Secondary | ICD-10-CM | POA: Diagnosis not present

## 2024-04-23 DIAGNOSIS — E1122 Type 2 diabetes mellitus with diabetic chronic kidney disease: Secondary | ICD-10-CM | POA: Diagnosis not present

## 2024-04-23 DIAGNOSIS — N186 End stage renal disease: Secondary | ICD-10-CM | POA: Diagnosis not present

## 2024-04-23 DIAGNOSIS — Z992 Dependence on renal dialysis: Secondary | ICD-10-CM | POA: Diagnosis not present

## 2024-04-24 DIAGNOSIS — E1129 Type 2 diabetes mellitus with other diabetic kidney complication: Secondary | ICD-10-CM | POA: Diagnosis not present

## 2024-04-24 DIAGNOSIS — N2581 Secondary hyperparathyroidism of renal origin: Secondary | ICD-10-CM | POA: Diagnosis not present

## 2024-04-24 DIAGNOSIS — D509 Iron deficiency anemia, unspecified: Secondary | ICD-10-CM | POA: Diagnosis not present

## 2024-04-24 DIAGNOSIS — N186 End stage renal disease: Secondary | ICD-10-CM | POA: Diagnosis not present

## 2024-04-24 DIAGNOSIS — D631 Anemia in chronic kidney disease: Secondary | ICD-10-CM | POA: Diagnosis not present

## 2024-04-24 DIAGNOSIS — R519 Headache, unspecified: Secondary | ICD-10-CM | POA: Diagnosis not present

## 2024-04-24 DIAGNOSIS — Z992 Dependence on renal dialysis: Secondary | ICD-10-CM | POA: Diagnosis not present

## 2024-04-24 DIAGNOSIS — D688 Other specified coagulation defects: Secondary | ICD-10-CM | POA: Diagnosis not present

## 2024-04-26 DIAGNOSIS — N186 End stage renal disease: Secondary | ICD-10-CM | POA: Diagnosis not present

## 2024-04-26 DIAGNOSIS — N2581 Secondary hyperparathyroidism of renal origin: Secondary | ICD-10-CM | POA: Diagnosis not present

## 2024-04-26 DIAGNOSIS — R519 Headache, unspecified: Secondary | ICD-10-CM | POA: Diagnosis not present

## 2024-04-26 DIAGNOSIS — D688 Other specified coagulation defects: Secondary | ICD-10-CM | POA: Diagnosis not present

## 2024-04-26 DIAGNOSIS — D631 Anemia in chronic kidney disease: Secondary | ICD-10-CM | POA: Diagnosis not present

## 2024-04-26 DIAGNOSIS — Z992 Dependence on renal dialysis: Secondary | ICD-10-CM | POA: Diagnosis not present

## 2024-04-26 DIAGNOSIS — E1129 Type 2 diabetes mellitus with other diabetic kidney complication: Secondary | ICD-10-CM | POA: Diagnosis not present

## 2024-04-26 DIAGNOSIS — D509 Iron deficiency anemia, unspecified: Secondary | ICD-10-CM | POA: Diagnosis not present

## 2024-04-29 DIAGNOSIS — N2581 Secondary hyperparathyroidism of renal origin: Secondary | ICD-10-CM | POA: Diagnosis not present

## 2024-04-29 DIAGNOSIS — Z992 Dependence on renal dialysis: Secondary | ICD-10-CM | POA: Diagnosis not present

## 2024-04-29 DIAGNOSIS — R519 Headache, unspecified: Secondary | ICD-10-CM | POA: Diagnosis not present

## 2024-04-29 DIAGNOSIS — N186 End stage renal disease: Secondary | ICD-10-CM | POA: Diagnosis not present

## 2024-04-29 DIAGNOSIS — D509 Iron deficiency anemia, unspecified: Secondary | ICD-10-CM | POA: Diagnosis not present

## 2024-04-29 DIAGNOSIS — E1129 Type 2 diabetes mellitus with other diabetic kidney complication: Secondary | ICD-10-CM | POA: Diagnosis not present

## 2024-04-29 DIAGNOSIS — D688 Other specified coagulation defects: Secondary | ICD-10-CM | POA: Diagnosis not present

## 2024-04-29 DIAGNOSIS — D631 Anemia in chronic kidney disease: Secondary | ICD-10-CM | POA: Diagnosis not present

## 2024-04-30 ENCOUNTER — Ambulatory Visit: Payer: Medicare Other | Admitting: Podiatry

## 2024-04-30 ENCOUNTER — Encounter: Payer: Self-pay | Admitting: Podiatry

## 2024-04-30 DIAGNOSIS — M79674 Pain in right toe(s): Secondary | ICD-10-CM

## 2024-04-30 DIAGNOSIS — N186 End stage renal disease: Secondary | ICD-10-CM | POA: Diagnosis not present

## 2024-04-30 DIAGNOSIS — Z992 Dependence on renal dialysis: Secondary | ICD-10-CM

## 2024-04-30 DIAGNOSIS — Z794 Long term (current) use of insulin: Secondary | ICD-10-CM | POA: Diagnosis not present

## 2024-04-30 DIAGNOSIS — E0822 Diabetes mellitus due to underlying condition with diabetic chronic kidney disease: Secondary | ICD-10-CM

## 2024-04-30 DIAGNOSIS — M79675 Pain in left toe(s): Secondary | ICD-10-CM

## 2024-04-30 DIAGNOSIS — B351 Tinea unguium: Secondary | ICD-10-CM | POA: Diagnosis not present

## 2024-04-30 NOTE — Progress Notes (Signed)
 This patient returns to my office for at risk foot care.  This patient requires this care by a professional since this patient will be at risk due to having diabetes with peripheral angiopathy.  This patient is unable to cut nails herself since the patient cannot reach her nails.These nails are painful walking and wearing shoes.  This patient presents for at risk foot care today.  General Appearance  Alert, conversant and in no acute stress.  Vascular  Dorsalis pedis and posterior tibial  pulses are  weakly palpable  bilaterally.  Capillary return is within normal limits  bilaterally. Temperature is within normal limits  bilaterally.  Neurologic  Senn-Weinstein monofilament wire test within normal limits  bilaterally. Muscle power within normal limits bilaterally.  Nails Thick disfigured discolored nails with subungual debris  from hallux to fifth toes bilaterally. No evidence of bacterial infection or drainage bilaterally. Pincer hallux nails.  Orthopedic  No limitations of motion  feet .  No crepitus or effusions noted.  No bony pathology or digital deformities noted.  Skin  normotropic skin with no porokeratosis noted bilaterally.  No signs of infections or ulcers noted.     Onychomycosis  Pain in right toes  Pain in left toes  Consent was obtained for treatment procedures.   Mechanical debridement of nails 1-5  bilaterally performed with a nail nipper.  Filed with dremel without incident.    Return office visit    3 months                  Told patient to return for periodic foot care and evaluation due to potential at risk complications.   Ruffin Cotton DPM

## 2024-05-01 DIAGNOSIS — D509 Iron deficiency anemia, unspecified: Secondary | ICD-10-CM | POA: Diagnosis not present

## 2024-05-01 DIAGNOSIS — D631 Anemia in chronic kidney disease: Secondary | ICD-10-CM | POA: Diagnosis not present

## 2024-05-01 DIAGNOSIS — N2581 Secondary hyperparathyroidism of renal origin: Secondary | ICD-10-CM | POA: Diagnosis not present

## 2024-05-01 DIAGNOSIS — Z992 Dependence on renal dialysis: Secondary | ICD-10-CM | POA: Diagnosis not present

## 2024-05-01 DIAGNOSIS — E1129 Type 2 diabetes mellitus with other diabetic kidney complication: Secondary | ICD-10-CM | POA: Diagnosis not present

## 2024-05-01 DIAGNOSIS — D688 Other specified coagulation defects: Secondary | ICD-10-CM | POA: Diagnosis not present

## 2024-05-01 DIAGNOSIS — N186 End stage renal disease: Secondary | ICD-10-CM | POA: Diagnosis not present

## 2024-05-01 DIAGNOSIS — R519 Headache, unspecified: Secondary | ICD-10-CM | POA: Diagnosis not present

## 2024-05-03 DIAGNOSIS — D688 Other specified coagulation defects: Secondary | ICD-10-CM | POA: Diagnosis not present

## 2024-05-03 DIAGNOSIS — R519 Headache, unspecified: Secondary | ICD-10-CM | POA: Diagnosis not present

## 2024-05-03 DIAGNOSIS — D631 Anemia in chronic kidney disease: Secondary | ICD-10-CM | POA: Diagnosis not present

## 2024-05-03 DIAGNOSIS — E1129 Type 2 diabetes mellitus with other diabetic kidney complication: Secondary | ICD-10-CM | POA: Diagnosis not present

## 2024-05-03 DIAGNOSIS — Z992 Dependence on renal dialysis: Secondary | ICD-10-CM | POA: Diagnosis not present

## 2024-05-03 DIAGNOSIS — N186 End stage renal disease: Secondary | ICD-10-CM | POA: Diagnosis not present

## 2024-05-03 DIAGNOSIS — D509 Iron deficiency anemia, unspecified: Secondary | ICD-10-CM | POA: Diagnosis not present

## 2024-05-03 DIAGNOSIS — N2581 Secondary hyperparathyroidism of renal origin: Secondary | ICD-10-CM | POA: Diagnosis not present

## 2024-05-06 DIAGNOSIS — D631 Anemia in chronic kidney disease: Secondary | ICD-10-CM | POA: Diagnosis not present

## 2024-05-06 DIAGNOSIS — N2581 Secondary hyperparathyroidism of renal origin: Secondary | ICD-10-CM | POA: Diagnosis not present

## 2024-05-06 DIAGNOSIS — R519 Headache, unspecified: Secondary | ICD-10-CM | POA: Diagnosis not present

## 2024-05-06 DIAGNOSIS — E1129 Type 2 diabetes mellitus with other diabetic kidney complication: Secondary | ICD-10-CM | POA: Diagnosis not present

## 2024-05-06 DIAGNOSIS — D509 Iron deficiency anemia, unspecified: Secondary | ICD-10-CM | POA: Diagnosis not present

## 2024-05-06 DIAGNOSIS — D688 Other specified coagulation defects: Secondary | ICD-10-CM | POA: Diagnosis not present

## 2024-05-06 DIAGNOSIS — N186 End stage renal disease: Secondary | ICD-10-CM | POA: Diagnosis not present

## 2024-05-06 DIAGNOSIS — Z992 Dependence on renal dialysis: Secondary | ICD-10-CM | POA: Diagnosis not present

## 2024-05-08 DIAGNOSIS — D631 Anemia in chronic kidney disease: Secondary | ICD-10-CM | POA: Diagnosis not present

## 2024-05-08 DIAGNOSIS — E1129 Type 2 diabetes mellitus with other diabetic kidney complication: Secondary | ICD-10-CM | POA: Diagnosis not present

## 2024-05-08 DIAGNOSIS — D509 Iron deficiency anemia, unspecified: Secondary | ICD-10-CM | POA: Diagnosis not present

## 2024-05-08 DIAGNOSIS — R519 Headache, unspecified: Secondary | ICD-10-CM | POA: Diagnosis not present

## 2024-05-08 DIAGNOSIS — N186 End stage renal disease: Secondary | ICD-10-CM | POA: Diagnosis not present

## 2024-05-08 DIAGNOSIS — Z992 Dependence on renal dialysis: Secondary | ICD-10-CM | POA: Diagnosis not present

## 2024-05-08 DIAGNOSIS — D688 Other specified coagulation defects: Secondary | ICD-10-CM | POA: Diagnosis not present

## 2024-05-08 DIAGNOSIS — N2581 Secondary hyperparathyroidism of renal origin: Secondary | ICD-10-CM | POA: Diagnosis not present

## 2024-05-10 DIAGNOSIS — D688 Other specified coagulation defects: Secondary | ICD-10-CM | POA: Diagnosis not present

## 2024-05-10 DIAGNOSIS — D509 Iron deficiency anemia, unspecified: Secondary | ICD-10-CM | POA: Diagnosis not present

## 2024-05-10 DIAGNOSIS — Z992 Dependence on renal dialysis: Secondary | ICD-10-CM | POA: Diagnosis not present

## 2024-05-10 DIAGNOSIS — R519 Headache, unspecified: Secondary | ICD-10-CM | POA: Diagnosis not present

## 2024-05-10 DIAGNOSIS — N2581 Secondary hyperparathyroidism of renal origin: Secondary | ICD-10-CM | POA: Diagnosis not present

## 2024-05-10 DIAGNOSIS — N186 End stage renal disease: Secondary | ICD-10-CM | POA: Diagnosis not present

## 2024-05-10 DIAGNOSIS — E1129 Type 2 diabetes mellitus with other diabetic kidney complication: Secondary | ICD-10-CM | POA: Diagnosis not present

## 2024-05-10 DIAGNOSIS — D631 Anemia in chronic kidney disease: Secondary | ICD-10-CM | POA: Diagnosis not present

## 2024-05-12 ENCOUNTER — Encounter (HOSPITAL_COMMUNITY)
Admission: RE | Disposition: A | Discharge status: Home / Self Care | Payer: Self-pay | Source: Home / Self Care | Attending: Vascular Surgery

## 2024-05-12 ENCOUNTER — Other Ambulatory Visit: Payer: Self-pay

## 2024-05-12 ENCOUNTER — Ambulatory Visit (HOSPITAL_COMMUNITY)
Admission: RE | Admit: 2024-05-12 | Discharge: 2024-05-12 | Disposition: A | Discharge status: Home / Self Care | Attending: Vascular Surgery | Admitting: Vascular Surgery

## 2024-05-12 ENCOUNTER — Other Ambulatory Visit (HOSPITAL_COMMUNITY): Payer: Self-pay

## 2024-05-12 DIAGNOSIS — Z992 Dependence on renal dialysis: Secondary | ICD-10-CM

## 2024-05-12 DIAGNOSIS — I871 Compression of vein: Secondary | ICD-10-CM

## 2024-05-12 DIAGNOSIS — T82868A Thrombosis of vascular prosthetic devices, implants and grafts, initial encounter: Secondary | ICD-10-CM

## 2024-05-12 DIAGNOSIS — N186 End stage renal disease: Secondary | ICD-10-CM | POA: Diagnosis not present

## 2024-05-12 DIAGNOSIS — X58XXXA Exposure to other specified factors, initial encounter: Secondary | ICD-10-CM | POA: Insufficient documentation

## 2024-05-12 HISTORY — PX: A/V SHUNT INTERVENTION: CATH118220

## 2024-05-12 HISTORY — PX: PERIPHERAL VASCULAR THROMBECTOMY: CATH118306

## 2024-05-12 HISTORY — PX: A/V FISTULAGRAM: CATH118298

## 2024-05-12 SURGERY — A/V SHUNT INTERVENTION
Anesthesia: LOCAL

## 2024-05-12 MED ORDER — HEPARIN SODIUM (PORCINE) 1000 UNIT/ML IJ SOLN
INTRAMUSCULAR | Status: AC
  Filled 2024-05-12: qty 10

## 2024-05-12 MED ORDER — ASPIRIN 81 MG PO TBEC: 81.0000 mg | DELAYED_RELEASE_TABLET | Freq: Every day | ORAL | 2 refills | Status: AC

## 2024-05-12 MED ORDER — HEPARIN SODIUM (PORCINE) 1000 UNIT/ML IJ SOLN
INTRAMUSCULAR | Status: DC | PRN
  Administered 2024-05-12: 9000 [IU] via INTRAVENOUS

## 2024-05-12 MED ORDER — IODIXANOL 320 MG/ML IV SOLN
INTRAVENOUS | Status: DC | PRN
  Administered 2024-05-12: 12 mL

## 2024-05-12 MED ORDER — HEPARIN (PORCINE) IN NACL 2000-0.9 UNIT/L-% IV SOLN
INTRAVENOUS | Status: DC | PRN
  Administered 2024-05-12: 1000 mL

## 2024-05-12 MED ORDER — LIDOCAINE HCL (PF) 1 % IJ SOLN
INTRAMUSCULAR | Status: AC
  Filled 2024-05-12: qty 30

## 2024-05-12 MED ORDER — APIXABAN 5 MG PO TABS: 5.0000 mg | ORAL_TABLET | Freq: Two times a day (BID) | ORAL | 11 refills | Status: DC

## 2024-05-12 SURGICAL SUPPLY — 13 items
BALLOON FOGARTY 5FR 40 (CATHETERS) IMPLANT
BALLOON MUSTANG 6.0X40 75 (BALLOONS) IMPLANT
CATH BAL XXL/14 4/5.8/75 (BALLOONS) IMPLANT
CATH BEACON 5 .035 65 KMP TIP (CATHETERS) IMPLANT
CATH EMB 4FR 40 (CATHETERS) IMPLANT
GLIDEWIRE ADV .035X180CM (WIRE) IMPLANT
KIT ENCORE 40 (KITS) IMPLANT
KIT MICROPUNCTURE NIT STIFF (SHEATH) IMPLANT
SHEATH PINNACLE R/O II 6F 4CM (SHEATH) IMPLANT
SHEATH PINNACLE ST 7F 45CM (SHEATH) IMPLANT
SHEATH PROBE COVER 6X72 (BAG) IMPLANT
WIRE BENTSON .035X145CM (WIRE) IMPLANT
WIRE G V18X300CM (WIRE) IMPLANT

## 2024-05-12 NOTE — H&P (Signed)
 See below for H&P details. Plan RUE AVG thrombectomy in cath lab today. Counseled this may not be effective given recent intervention.  Heber Little. Edgardo Goodwill, MD FACS [facility] Phone Number: [phone] 05/12/2024 9:12 AM     POST OPERATIVE OFFICE NOTE    CC:  F/u for surgery  HPI:  Denesha Vanderwall is a 83 y.o. female who presents today for repeat wound check.  She has a history of right axillary to axillary AV loop graft placement on 03/07/2024 by Dr. Vikki Graves.  Prior to this, she has had several failed dialysis accesses.  At her first follow-up with our office she had erythema and swelling overlying the distal portion of her graft with concern for cellulitis.  She also had a nonpalpable thrill in the graft, but it was dopplerable.  Pt returns today for follow up.  Pt states her arm is feeling better.  All of the redness and swelling overlying the graft has gone away.  She denies any fevers or chills.  She dialyzes on Tuesdays, Thursdays, and Saturdays via [facility].   Allergies  Allergen Reactions   Penicillins Rash    Did it involve swelling of the face/tongue/throat, SOB, or low BP? No Did it involve sudden or severe rash/hives, skin peeling, or any reaction on the inside of your mouth or nose? Yes Did you need to seek medical attention at a hospital or doctor's office? N/A When did it last happen? N/A      If all above answers are "NO", may proceed with cephalosporin use.    No current facility-administered medications for this encounter.     ROS:  See HPI  Physical Exam:  Incision: Well-healed right upper arm incisions without signs of infection Extremities: Resolved erythema and swelling of the right upper arm.  Right upper arm AV loop graft with nonpalpable thrill.  I can auscultate a bruit with the Doppler machine.  1+ right radial pulse Neuro: Intact motor and sensation of RUE    Assessment/Plan:  This is a 83 y.o. female who is here for  repeat wound check  -The patient recently underwent placement of a right axillary artery to axillary vein AV loop graft for permanent dialysis access -At our first wound check, she had some erythema and swelling over the distal portion of her graft, with concern for cellulitis.  At today's wound check, her erythema and swelling has resolved.  There are no signs of infection of the AV graft.  Her right upper arm incisions are also well-healed -She has no symptoms of steal.  She has a palpable right radial pulse -Her AV graft still does not have a palpable thrill within it.  I can hear a bruit within it with the Doppler machine.  She likely has very low flow through it due to potential outflow stenosis. -She will require a fistulogram prior to use of her graft.  This will likely be right upper arm fistulogram with possible outflow angioplasty and/or stenting.  This can be done with Dr. Vikki Graves within the next few weeks.  She is agreeable to this procedure.  For the time being, dialysis can continue to use her TDC   McKenzi Schuh, PA-C [facility] [phone]   Clinic MD:  Vikki Graves

## 2024-05-12 NOTE — Op Note (Addendum)
 DATE OF SERVICE: 05/12/2024  PATIENT:  Katie Garcia  83 y.o. female  PRE-OPERATIVE DIAGNOSIS: End-stage renal disease, thrombosed right arm arteriovenous graft  POST-OPERATIVE DIAGNOSIS:  Same  PROCEDURE:   1) ultrasound guided right arm arteriovenous graft access x 2 2) right arm fistulagram 3) right arm arteriovenous graft mechanical thrombectomy 4) right axillary vein angioplasty (7 x 80 mm Mustang) 5) right brachiocephalic angioplasty (14 x 40 mm Athletis) 6) superior vena cava angioplasty (14 x 40 mm Athletis)  SURGEON:  Heber Little. Edgardo Goodwill, MD  ASSISTANT: none  ANESTHESIA:   local  ESTIMATED BLOOD LOSS: minimal  LOCAL MEDICATIONS USED:  LIDOCAINE    COUNTS: confirmed correct.  PATIENT DISPOSITION:  PACU - hemodynamically stable.   Delay start of Pharmacological VTE agent (>24hrs) due to surgical blood loss or risk of bleeding: no  INDICATION FOR PROCEDURE: Katie Garcia is a 83 y.o. female with ESRD and thrombosed AVG. After careful discussion of risks, benefits, and alternatives the patient was offered thrombectomy. The patient understood and wished to proceed.  OPERATIVE FINDINGS:  Thrombosed arteriovenous graft. Good result from thrombectomy. Some residual thrombus/stenosis in the previously placed axillary vein stenting which resolved with angioplasty. Central venous stenosis noted at the confluence of the right brachiocephalic vein and superior vena cava.  Some improvement with angioplasty.  This appears to be mostly related to catheter occupying space and stenotic area.  DESCRIPTION OF PROCEDURE: After identification of the patient in the pre-operative holding area, the patient was transferred to the operating room. The patient was positioned supine on the operating room table.  The right arm was prepped and draped in standard fashion. A surgical pause was performed confirming correct patient, procedure, and operative location.  Dialysis access was visualized with  intraoperative ultrasound.  2 wheals of 1% lidocaine  were raised over the access.  The arteriovenous graft is a loop graft with the arterial facing limb directed clockwise.  Micropuncture access was obtained facing the arterial anastomosis.  Micropuncture access was exchanged for 6 French sheath using Seldinger technique.  A Bentson wire was navigated across the arterial anastomosis.  A KMP catheter was advanced across the arterial anastomosis.  Angiogram was performed.  This showed normal arterial anatomy.  A over-the-wire thrombectomy was performed restoring inflow to the graft.  New access was then obtained in the venous facing direction.  Micropuncture access was obtained, which was upsized to 6 [place] sheath using Seldinger technique.  A 7 x 80 mm Mustang balloon was then used to perform angioplasty and "bilateral brush" thrombectomy of the graft.  Good technical result was achieved.  Follow-up angiogram showed residual stenosis/chronic thrombus in the previously placed Viabahn stents in the brachiocephalic vein.  This improved with angioplasty.  I further identified central venous stenosis.  This was angioplastied with a 14 x 40 mm Atlas balloon with minimal response.  The patient tolerated the procedure well.  All endovascular equipment was removed.  Pursestring sutures were applied around the access sites.  Hemostasis was achieved.  Sterile bandages were applied.  Upon completion of the case instrument and sharps counts were confirmed correct. The patient was transferred to the PACU in good condition. I was present for all portions of the procedure.  PLAN: Start aspirin  and Eliquis  2 reduce risk of rethrombosis.  Should graft thrombosed again, I would recommend creation of new access.  Patient should follow-up with [facility] PA in 4 weeks with AV fistula duplex.  Heber Little. Edgardo Goodwill, MD FACS [facility]  Office Phone Number: [phone] 05/12/2024 10:29 AM

## 2024-05-13 ENCOUNTER — Encounter (HOSPITAL_COMMUNITY): Payer: Self-pay | Admitting: Vascular Surgery

## 2024-05-13 DIAGNOSIS — D688 Other specified coagulation defects: Secondary | ICD-10-CM | POA: Diagnosis not present

## 2024-05-13 DIAGNOSIS — N186 End stage renal disease: Secondary | ICD-10-CM | POA: Diagnosis not present

## 2024-05-13 DIAGNOSIS — E1129 Type 2 diabetes mellitus with other diabetic kidney complication: Secondary | ICD-10-CM | POA: Diagnosis not present

## 2024-05-13 DIAGNOSIS — R519 Headache, unspecified: Secondary | ICD-10-CM | POA: Diagnosis not present

## 2024-05-13 DIAGNOSIS — Z992 Dependence on renal dialysis: Secondary | ICD-10-CM | POA: Diagnosis not present

## 2024-05-13 DIAGNOSIS — N2581 Secondary hyperparathyroidism of renal origin: Secondary | ICD-10-CM | POA: Diagnosis not present

## 2024-05-13 DIAGNOSIS — D631 Anemia in chronic kidney disease: Secondary | ICD-10-CM | POA: Diagnosis not present

## 2024-05-13 DIAGNOSIS — D509 Iron deficiency anemia, unspecified: Secondary | ICD-10-CM | POA: Diagnosis not present

## 2024-05-13 MED FILL — Lidocaine HCl Local Preservative Free (PF) Inj 1%: INTRAMUSCULAR | Qty: 30 | Status: AC

## 2024-05-15 DIAGNOSIS — R519 Headache, unspecified: Secondary | ICD-10-CM | POA: Diagnosis not present

## 2024-05-15 DIAGNOSIS — D631 Anemia in chronic kidney disease: Secondary | ICD-10-CM | POA: Diagnosis not present

## 2024-05-15 DIAGNOSIS — D688 Other specified coagulation defects: Secondary | ICD-10-CM | POA: Diagnosis not present

## 2024-05-15 DIAGNOSIS — D509 Iron deficiency anemia, unspecified: Secondary | ICD-10-CM | POA: Diagnosis not present

## 2024-05-15 DIAGNOSIS — Z992 Dependence on renal dialysis: Secondary | ICD-10-CM | POA: Diagnosis not present

## 2024-05-15 DIAGNOSIS — E1129 Type 2 diabetes mellitus with other diabetic kidney complication: Secondary | ICD-10-CM | POA: Diagnosis not present

## 2024-05-15 DIAGNOSIS — N2581 Secondary hyperparathyroidism of renal origin: Secondary | ICD-10-CM | POA: Diagnosis not present

## 2024-05-15 DIAGNOSIS — N186 End stage renal disease: Secondary | ICD-10-CM | POA: Diagnosis not present

## 2024-05-16 DIAGNOSIS — Z992 Dependence on renal dialysis: Secondary | ICD-10-CM | POA: Diagnosis not present

## 2024-05-16 DIAGNOSIS — J45998 Other asthma: Secondary | ICD-10-CM | POA: Diagnosis not present

## 2024-05-17 DIAGNOSIS — D631 Anemia in chronic kidney disease: Secondary | ICD-10-CM | POA: Diagnosis not present

## 2024-05-17 DIAGNOSIS — D509 Iron deficiency anemia, unspecified: Secondary | ICD-10-CM | POA: Diagnosis not present

## 2024-05-17 DIAGNOSIS — E1129 Type 2 diabetes mellitus with other diabetic kidney complication: Secondary | ICD-10-CM | POA: Diagnosis not present

## 2024-05-17 DIAGNOSIS — D688 Other specified coagulation defects: Secondary | ICD-10-CM | POA: Diagnosis not present

## 2024-05-17 DIAGNOSIS — R519 Headache, unspecified: Secondary | ICD-10-CM | POA: Diagnosis not present

## 2024-05-17 DIAGNOSIS — Z992 Dependence on renal dialysis: Secondary | ICD-10-CM | POA: Diagnosis not present

## 2024-05-17 DIAGNOSIS — N2581 Secondary hyperparathyroidism of renal origin: Secondary | ICD-10-CM | POA: Diagnosis not present

## 2024-05-17 DIAGNOSIS — N186 End stage renal disease: Secondary | ICD-10-CM | POA: Diagnosis not present

## 2024-05-20 DIAGNOSIS — D688 Other specified coagulation defects: Secondary | ICD-10-CM | POA: Diagnosis not present

## 2024-05-20 DIAGNOSIS — E1129 Type 2 diabetes mellitus with other diabetic kidney complication: Secondary | ICD-10-CM | POA: Diagnosis not present

## 2024-05-20 DIAGNOSIS — D509 Iron deficiency anemia, unspecified: Secondary | ICD-10-CM | POA: Diagnosis not present

## 2024-05-20 DIAGNOSIS — N186 End stage renal disease: Secondary | ICD-10-CM | POA: Diagnosis not present

## 2024-05-20 DIAGNOSIS — N2581 Secondary hyperparathyroidism of renal origin: Secondary | ICD-10-CM | POA: Diagnosis not present

## 2024-05-20 DIAGNOSIS — Z992 Dependence on renal dialysis: Secondary | ICD-10-CM | POA: Diagnosis not present

## 2024-05-20 DIAGNOSIS — R519 Headache, unspecified: Secondary | ICD-10-CM | POA: Diagnosis not present

## 2024-05-20 DIAGNOSIS — D631 Anemia in chronic kidney disease: Secondary | ICD-10-CM | POA: Diagnosis not present

## 2024-05-22 DIAGNOSIS — D631 Anemia in chronic kidney disease: Secondary | ICD-10-CM | POA: Diagnosis not present

## 2024-05-22 DIAGNOSIS — R519 Headache, unspecified: Secondary | ICD-10-CM | POA: Diagnosis not present

## 2024-05-22 DIAGNOSIS — D688 Other specified coagulation defects: Secondary | ICD-10-CM | POA: Diagnosis not present

## 2024-05-22 DIAGNOSIS — Z992 Dependence on renal dialysis: Secondary | ICD-10-CM | POA: Diagnosis not present

## 2024-05-22 DIAGNOSIS — N186 End stage renal disease: Secondary | ICD-10-CM | POA: Diagnosis not present

## 2024-05-22 DIAGNOSIS — N2581 Secondary hyperparathyroidism of renal origin: Secondary | ICD-10-CM | POA: Diagnosis not present

## 2024-05-22 DIAGNOSIS — D509 Iron deficiency anemia, unspecified: Secondary | ICD-10-CM | POA: Diagnosis not present

## 2024-05-22 DIAGNOSIS — E1129 Type 2 diabetes mellitus with other diabetic kidney complication: Secondary | ICD-10-CM | POA: Diagnosis not present

## 2024-05-24 DIAGNOSIS — E1122 Type 2 diabetes mellitus with diabetic chronic kidney disease: Secondary | ICD-10-CM | POA: Diagnosis not present

## 2024-05-24 DIAGNOSIS — D631 Anemia in chronic kidney disease: Secondary | ICD-10-CM | POA: Diagnosis not present

## 2024-05-24 DIAGNOSIS — D509 Iron deficiency anemia, unspecified: Secondary | ICD-10-CM | POA: Diagnosis not present

## 2024-05-24 DIAGNOSIS — N186 End stage renal disease: Secondary | ICD-10-CM | POA: Diagnosis not present

## 2024-05-24 DIAGNOSIS — E1129 Type 2 diabetes mellitus with other diabetic kidney complication: Secondary | ICD-10-CM | POA: Diagnosis not present

## 2024-05-24 DIAGNOSIS — Z992 Dependence on renal dialysis: Secondary | ICD-10-CM | POA: Diagnosis not present

## 2024-05-24 DIAGNOSIS — N2581 Secondary hyperparathyroidism of renal origin: Secondary | ICD-10-CM | POA: Diagnosis not present

## 2024-05-24 DIAGNOSIS — R519 Headache, unspecified: Secondary | ICD-10-CM | POA: Diagnosis not present

## 2024-05-24 DIAGNOSIS — D688 Other specified coagulation defects: Secondary | ICD-10-CM | POA: Diagnosis not present

## 2024-05-27 DIAGNOSIS — N2581 Secondary hyperparathyroidism of renal origin: Secondary | ICD-10-CM | POA: Diagnosis not present

## 2024-05-27 DIAGNOSIS — R519 Headache, unspecified: Secondary | ICD-10-CM | POA: Diagnosis not present

## 2024-05-27 DIAGNOSIS — Z992 Dependence on renal dialysis: Secondary | ICD-10-CM | POA: Diagnosis not present

## 2024-05-27 DIAGNOSIS — N186 End stage renal disease: Secondary | ICD-10-CM | POA: Diagnosis not present

## 2024-05-27 DIAGNOSIS — D688 Other specified coagulation defects: Secondary | ICD-10-CM | POA: Diagnosis not present

## 2024-05-27 DIAGNOSIS — D631 Anemia in chronic kidney disease: Secondary | ICD-10-CM | POA: Diagnosis not present

## 2024-05-27 DIAGNOSIS — E1129 Type 2 diabetes mellitus with other diabetic kidney complication: Secondary | ICD-10-CM | POA: Diagnosis not present

## 2024-05-29 DIAGNOSIS — N2581 Secondary hyperparathyroidism of renal origin: Secondary | ICD-10-CM | POA: Diagnosis not present

## 2024-05-29 DIAGNOSIS — R519 Headache, unspecified: Secondary | ICD-10-CM | POA: Diagnosis not present

## 2024-05-29 DIAGNOSIS — Z992 Dependence on renal dialysis: Secondary | ICD-10-CM | POA: Diagnosis not present

## 2024-05-29 DIAGNOSIS — E1129 Type 2 diabetes mellitus with other diabetic kidney complication: Secondary | ICD-10-CM | POA: Diagnosis not present

## 2024-05-29 DIAGNOSIS — N186 End stage renal disease: Secondary | ICD-10-CM | POA: Diagnosis not present

## 2024-05-29 DIAGNOSIS — D631 Anemia in chronic kidney disease: Secondary | ICD-10-CM | POA: Diagnosis not present

## 2024-05-29 DIAGNOSIS — D688 Other specified coagulation defects: Secondary | ICD-10-CM | POA: Diagnosis not present

## 2024-05-31 DIAGNOSIS — Z992 Dependence on renal dialysis: Secondary | ICD-10-CM | POA: Diagnosis not present

## 2024-05-31 DIAGNOSIS — N186 End stage renal disease: Secondary | ICD-10-CM | POA: Diagnosis not present

## 2024-05-31 DIAGNOSIS — D688 Other specified coagulation defects: Secondary | ICD-10-CM | POA: Diagnosis not present

## 2024-05-31 DIAGNOSIS — R519 Headache, unspecified: Secondary | ICD-10-CM | POA: Diagnosis not present

## 2024-05-31 DIAGNOSIS — E1129 Type 2 diabetes mellitus with other diabetic kidney complication: Secondary | ICD-10-CM | POA: Diagnosis not present

## 2024-05-31 DIAGNOSIS — N2581 Secondary hyperparathyroidism of renal origin: Secondary | ICD-10-CM | POA: Diagnosis not present

## 2024-05-31 DIAGNOSIS — D631 Anemia in chronic kidney disease: Secondary | ICD-10-CM | POA: Diagnosis not present

## 2024-06-02 ENCOUNTER — Telehealth: Payer: Self-pay

## 2024-06-02 NOTE — Telephone Encounter (Signed)
 Pt's daughter, Katie Garcia, called on 05/29/24 about not receiving Eliquis  from the [employer] Patient Assistance [employer]. A phone call was returned by this PAA on 06/02/24 reminding the pt that the form was faxed to [employer] on 05/20/24 at 9:36am (filled out by Katie Ke, RN and stamped with Katie Garcia name) with all the information filled out on the form for pt to receive her medication, however, daughter states that [employer] was needing a fax of the insurance card, so a call was made to the foundation by this PAA. When speaking with a representative, it was confirmed that a copy of the insurance card and a the NPI was needed for Katie Garcia, so NPI was provided via phone call and the copy of the insurance was given via fax, which was submitted successfully at 15:07 on 06/02/24. After phone call was completed with [employer] this PAA called pt's daughter and left a voicemail stating a copy of the insurance along with other information needed was provided, and to call the foundation in the morning to confirm that all information was provided so Eliquis  can be given to pt.

## 2024-06-03 ENCOUNTER — Telehealth: Payer: Self-pay

## 2024-06-03 DIAGNOSIS — D631 Anemia in chronic kidney disease: Secondary | ICD-10-CM | POA: Diagnosis not present

## 2024-06-03 DIAGNOSIS — N2581 Secondary hyperparathyroidism of renal origin: Secondary | ICD-10-CM | POA: Diagnosis not present

## 2024-06-03 DIAGNOSIS — Z992 Dependence on renal dialysis: Secondary | ICD-10-CM | POA: Diagnosis not present

## 2024-06-03 DIAGNOSIS — E1129 Type 2 diabetes mellitus with other diabetic kidney complication: Secondary | ICD-10-CM | POA: Diagnosis not present

## 2024-06-03 DIAGNOSIS — R519 Headache, unspecified: Secondary | ICD-10-CM | POA: Diagnosis not present

## 2024-06-03 DIAGNOSIS — D688 Other specified coagulation defects: Secondary | ICD-10-CM | POA: Diagnosis not present

## 2024-06-03 DIAGNOSIS — N186 End stage renal disease: Secondary | ICD-10-CM | POA: Diagnosis not present

## 2024-06-03 NOTE — Telephone Encounter (Signed)
 Informed via teams by Alban Hudson on 06/03/24 at 15:10 that a denial for Eliquis  financial assistance from [employer] was received via fax. This PAA called the foundation at 15:12 and spoke with a rep about notifying the patient not only by letter but by phone call as well. The rep explained that she would put it in the que but wouldn't be until Friday until pt is called, so this PAA stated that isn't acceptable and that pt needs to be notified immediately if pt is depending on this assistance, the importance of the medication itself, and the chance of pt not having any left. The rep sent a message to her manager for a phone call to be made much sooner (although a definite day was not given). It is unknown by this PAA why the decision of the denial was made. Triage made aware of the denial and a potential back up plan may need to be made.

## 2024-06-05 DIAGNOSIS — Z992 Dependence on renal dialysis: Secondary | ICD-10-CM | POA: Diagnosis not present

## 2024-06-05 DIAGNOSIS — N2581 Secondary hyperparathyroidism of renal origin: Secondary | ICD-10-CM | POA: Diagnosis not present

## 2024-06-05 DIAGNOSIS — N186 End stage renal disease: Secondary | ICD-10-CM | POA: Diagnosis not present

## 2024-06-05 DIAGNOSIS — D631 Anemia in chronic kidney disease: Secondary | ICD-10-CM | POA: Diagnosis not present

## 2024-06-05 DIAGNOSIS — D688 Other specified coagulation defects: Secondary | ICD-10-CM | POA: Diagnosis not present

## 2024-06-05 DIAGNOSIS — E1129 Type 2 diabetes mellitus with other diabetic kidney complication: Secondary | ICD-10-CM | POA: Diagnosis not present

## 2024-06-05 DIAGNOSIS — R519 Headache, unspecified: Secondary | ICD-10-CM | POA: Diagnosis not present

## 2024-06-07 ENCOUNTER — Other Ambulatory Visit: Payer: Self-pay | Admitting: "Endocrinology

## 2024-06-07 DIAGNOSIS — D631 Anemia in chronic kidney disease: Secondary | ICD-10-CM | POA: Diagnosis not present

## 2024-06-07 DIAGNOSIS — R519 Headache, unspecified: Secondary | ICD-10-CM | POA: Diagnosis not present

## 2024-06-07 DIAGNOSIS — Z992 Dependence on renal dialysis: Secondary | ICD-10-CM | POA: Diagnosis not present

## 2024-06-07 DIAGNOSIS — E1151 Type 2 diabetes mellitus with diabetic peripheral angiopathy without gangrene: Secondary | ICD-10-CM

## 2024-06-07 DIAGNOSIS — D688 Other specified coagulation defects: Secondary | ICD-10-CM | POA: Diagnosis not present

## 2024-06-07 DIAGNOSIS — N186 End stage renal disease: Secondary | ICD-10-CM | POA: Diagnosis not present

## 2024-06-07 DIAGNOSIS — N2581 Secondary hyperparathyroidism of renal origin: Secondary | ICD-10-CM | POA: Diagnosis not present

## 2024-06-07 DIAGNOSIS — E1129 Type 2 diabetes mellitus with other diabetic kidney complication: Secondary | ICD-10-CM | POA: Diagnosis not present

## 2024-06-09 DIAGNOSIS — N186 End stage renal disease: Secondary | ICD-10-CM | POA: Diagnosis not present

## 2024-06-09 DIAGNOSIS — Z992 Dependence on renal dialysis: Secondary | ICD-10-CM | POA: Diagnosis not present

## 2024-06-09 NOTE — Telephone Encounter (Signed)
 Requested Prescriptions   Pending Prescriptions Disp Refills   Continuous Glucose Sensor (DEXCOM G7 SENSOR) MISC [Pharmacy Med Name: DEXCOM G7 SENSOR] 3 each 2    Sig: CHANGE EVERY 10 DAYS

## 2024-06-10 DIAGNOSIS — D688 Other specified coagulation defects: Secondary | ICD-10-CM | POA: Diagnosis not present

## 2024-06-10 DIAGNOSIS — R519 Headache, unspecified: Secondary | ICD-10-CM | POA: Diagnosis not present

## 2024-06-10 DIAGNOSIS — E1129 Type 2 diabetes mellitus with other diabetic kidney complication: Secondary | ICD-10-CM | POA: Diagnosis not present

## 2024-06-10 DIAGNOSIS — Z992 Dependence on renal dialysis: Secondary | ICD-10-CM | POA: Diagnosis not present

## 2024-06-10 DIAGNOSIS — N2581 Secondary hyperparathyroidism of renal origin: Secondary | ICD-10-CM | POA: Diagnosis not present

## 2024-06-10 DIAGNOSIS — D631 Anemia in chronic kidney disease: Secondary | ICD-10-CM | POA: Diagnosis not present

## 2024-06-10 DIAGNOSIS — N186 End stage renal disease: Secondary | ICD-10-CM | POA: Diagnosis not present

## 2024-06-12 ENCOUNTER — Other Ambulatory Visit: Payer: Self-pay

## 2024-06-12 DIAGNOSIS — N186 End stage renal disease: Secondary | ICD-10-CM | POA: Diagnosis not present

## 2024-06-12 DIAGNOSIS — N2581 Secondary hyperparathyroidism of renal origin: Secondary | ICD-10-CM | POA: Diagnosis not present

## 2024-06-12 DIAGNOSIS — E1129 Type 2 diabetes mellitus with other diabetic kidney complication: Secondary | ICD-10-CM | POA: Diagnosis not present

## 2024-06-12 DIAGNOSIS — D688 Other specified coagulation defects: Secondary | ICD-10-CM | POA: Diagnosis not present

## 2024-06-12 DIAGNOSIS — Z992 Dependence on renal dialysis: Secondary | ICD-10-CM | POA: Diagnosis not present

## 2024-06-12 DIAGNOSIS — R519 Headache, unspecified: Secondary | ICD-10-CM | POA: Diagnosis not present

## 2024-06-12 DIAGNOSIS — D631 Anemia in chronic kidney disease: Secondary | ICD-10-CM | POA: Diagnosis not present

## 2024-06-14 DIAGNOSIS — N186 End stage renal disease: Secondary | ICD-10-CM | POA: Diagnosis not present

## 2024-06-14 DIAGNOSIS — D631 Anemia in chronic kidney disease: Secondary | ICD-10-CM | POA: Diagnosis not present

## 2024-06-14 DIAGNOSIS — Z992 Dependence on renal dialysis: Secondary | ICD-10-CM | POA: Diagnosis not present

## 2024-06-14 DIAGNOSIS — D688 Other specified coagulation defects: Secondary | ICD-10-CM | POA: Diagnosis not present

## 2024-06-14 DIAGNOSIS — E1129 Type 2 diabetes mellitus with other diabetic kidney complication: Secondary | ICD-10-CM | POA: Diagnosis not present

## 2024-06-14 DIAGNOSIS — N2581 Secondary hyperparathyroidism of renal origin: Secondary | ICD-10-CM | POA: Diagnosis not present

## 2024-06-14 DIAGNOSIS — R519 Headache, unspecified: Secondary | ICD-10-CM | POA: Diagnosis not present

## 2024-06-17 DIAGNOSIS — N186 End stage renal disease: Secondary | ICD-10-CM | POA: Diagnosis not present

## 2024-06-17 DIAGNOSIS — N2581 Secondary hyperparathyroidism of renal origin: Secondary | ICD-10-CM | POA: Diagnosis not present

## 2024-06-17 DIAGNOSIS — D631 Anemia in chronic kidney disease: Secondary | ICD-10-CM | POA: Diagnosis not present

## 2024-06-17 DIAGNOSIS — D688 Other specified coagulation defects: Secondary | ICD-10-CM | POA: Diagnosis not present

## 2024-06-17 DIAGNOSIS — E1129 Type 2 diabetes mellitus with other diabetic kidney complication: Secondary | ICD-10-CM | POA: Diagnosis not present

## 2024-06-17 DIAGNOSIS — R519 Headache, unspecified: Secondary | ICD-10-CM | POA: Diagnosis not present

## 2024-06-17 DIAGNOSIS — Z992 Dependence on renal dialysis: Secondary | ICD-10-CM | POA: Diagnosis not present

## 2024-06-19 DIAGNOSIS — N2581 Secondary hyperparathyroidism of renal origin: Secondary | ICD-10-CM | POA: Diagnosis not present

## 2024-06-19 DIAGNOSIS — N186 End stage renal disease: Secondary | ICD-10-CM | POA: Diagnosis not present

## 2024-06-19 DIAGNOSIS — D631 Anemia in chronic kidney disease: Secondary | ICD-10-CM | POA: Diagnosis not present

## 2024-06-19 DIAGNOSIS — D688 Other specified coagulation defects: Secondary | ICD-10-CM | POA: Diagnosis not present

## 2024-06-19 DIAGNOSIS — E1129 Type 2 diabetes mellitus with other diabetic kidney complication: Secondary | ICD-10-CM | POA: Diagnosis not present

## 2024-06-19 DIAGNOSIS — Z992 Dependence on renal dialysis: Secondary | ICD-10-CM | POA: Diagnosis not present

## 2024-06-19 DIAGNOSIS — R519 Headache, unspecified: Secondary | ICD-10-CM | POA: Diagnosis not present

## 2024-06-21 DIAGNOSIS — E1129 Type 2 diabetes mellitus with other diabetic kidney complication: Secondary | ICD-10-CM | POA: Diagnosis not present

## 2024-06-21 DIAGNOSIS — N186 End stage renal disease: Secondary | ICD-10-CM | POA: Diagnosis not present

## 2024-06-21 DIAGNOSIS — Z992 Dependence on renal dialysis: Secondary | ICD-10-CM | POA: Diagnosis not present

## 2024-06-21 DIAGNOSIS — R519 Headache, unspecified: Secondary | ICD-10-CM | POA: Diagnosis not present

## 2024-06-21 DIAGNOSIS — N2581 Secondary hyperparathyroidism of renal origin: Secondary | ICD-10-CM | POA: Diagnosis not present

## 2024-06-21 DIAGNOSIS — D631 Anemia in chronic kidney disease: Secondary | ICD-10-CM | POA: Diagnosis not present

## 2024-06-21 DIAGNOSIS — D688 Other specified coagulation defects: Secondary | ICD-10-CM | POA: Diagnosis not present

## 2024-06-23 DIAGNOSIS — E1122 Type 2 diabetes mellitus with diabetic chronic kidney disease: Secondary | ICD-10-CM | POA: Diagnosis not present

## 2024-06-23 DIAGNOSIS — N186 End stage renal disease: Secondary | ICD-10-CM | POA: Diagnosis not present

## 2024-06-23 DIAGNOSIS — Z992 Dependence on renal dialysis: Secondary | ICD-10-CM | POA: Diagnosis not present

## 2024-06-24 ENCOUNTER — Ambulatory Visit (HOSPITAL_COMMUNITY)
Admission: RE | Admit: 2024-06-24 | Discharge: 2024-06-24 | Disposition: A | Discharge status: Home / Self Care | Source: Ambulatory Visit | Attending: Vascular Surgery | Admitting: Vascular Surgery

## 2024-06-24 ENCOUNTER — Ambulatory Visit: Attending: Vascular Surgery | Admitting: Physician Assistant

## 2024-06-24 VITALS — BP 141/65 | HR 96 | Temp 97.8°F | Resp 18 | Ht 60.0 in

## 2024-06-24 DIAGNOSIS — R519 Headache, unspecified: Secondary | ICD-10-CM | POA: Diagnosis not present

## 2024-06-24 DIAGNOSIS — D631 Anemia in chronic kidney disease: Secondary | ICD-10-CM | POA: Diagnosis not present

## 2024-06-24 DIAGNOSIS — D688 Other specified coagulation defects: Secondary | ICD-10-CM | POA: Diagnosis not present

## 2024-06-24 DIAGNOSIS — E1129 Type 2 diabetes mellitus with other diabetic kidney complication: Secondary | ICD-10-CM | POA: Diagnosis not present

## 2024-06-24 DIAGNOSIS — Z992 Dependence on renal dialysis: Secondary | ICD-10-CM | POA: Diagnosis not present

## 2024-06-24 DIAGNOSIS — D509 Iron deficiency anemia, unspecified: Secondary | ICD-10-CM | POA: Diagnosis not present

## 2024-06-24 DIAGNOSIS — N186 End stage renal disease: Secondary | ICD-10-CM

## 2024-06-24 DIAGNOSIS — N2581 Secondary hyperparathyroidism of renal origin: Secondary | ICD-10-CM | POA: Diagnosis not present

## 2024-06-24 NOTE — Progress Notes (Signed)
 Established Dialysis Access   History of Present Illness   Katie Garcia is a 83 y.o. (Feb 25, 1941) female who presents for follow up after Fistulogram. On 5/19.25  he underwent 2) right arm fistulogram 3) right arm arteriovenous graft mechanical thrombectomy 4) right axillary vein angioplasty (7 x 80 mm Mustang) 5) right brachiocephalic angioplasty (14 x 40 mm Athletis) and 6) superior vena cava angioplasty (14 x 40 mm Athletis) by Dr. Magda.    She returns today with follow up with duplex evaluation of her RUE AV graft. She has been dialyzing on TTS at [address]. She reports that her graft has been working well. At time of her procedure Dr. Magda had recommended Eliquis  to help try to keep her graft open. Unfortunately it was cost prohibitive and patient was unable to start taking it. She is managed on Aspirin  and statin.  Current Outpatient Medications  Medication Sig Dispense Refill   Accu-Chek FastClix Lancets MISC      ACCU-CHEK GUIDE test strip 1 each by Other route in the morning and at bedtime. E11.22  1   acetaminophen  (TYLENOL ) 500 MG tablet Take 500-1,000 mg by mouth every 6 (six) hours as needed for moderate pain or headache.     allopurinol  (ZYLOPRIM ) 100 MG tablet Take 100 mg by mouth every Tuesday, Thursday, and Saturday at 6 PM. In the morning after dialysis     aspirin  EC 81 MG tablet Take 1 tablet (81 mg total) by mouth daily. Swallow whole. 150 tablet 2   B Complex-C-Folic Acid  (DIALYVITE 800) 0.8 MG TABS Take 1 tablet by mouth every evening.     Blood Glucose Monitoring Suppl (ACCU-CHEK GUIDE) w/Device KIT      calcium  acetate (PHOSLO ) 667 MG capsule Take 1,334 mg by mouth 3 (three) times daily with meals.  3   carvedilol  (COREG ) 12.5 MG tablet Take 6.25 mg by mouth in the morning and at bedtime.     Continuous Glucose Receiver (DEXCOM G7 RECEIVER) DEVI Use to monitor blood sugar. 1 each 0   Continuous Glucose Sensor (DEXCOM G7 SENSOR) MISC CHANGE EVERY  10 DAYS 3 each 2   guaifenesin  (ROBITUSSIN) 100 MG/5ML syrup Take 100 mg by mouth 3 (three) times daily as needed for cough.     insulin  glargine (LANTUS  SOLOSTAR) 100 UNIT/ML Solostar Pen INJECT 30 UNITS INTO THE SKIN DAILY. ADJUST AS DIRECTED (Patient taking differently: Inject 20 Units into the skin at bedtime. Adjust as directed) 45 mL 1   Insulin  Pen Needle 31G X 5 MM MISC Use for insulin  50 each 1   insulin  regular (NOVOLIN R) 100 units/mL injection Inject 20 Units into the skin daily before breakfast.     loratadine  (CLARITIN ) 10 MG tablet Take 10 mg by mouth daily.     RELION INSULIN  SYR 0.5ML/31G 31G X 5/16 0.5 ML MISC USE 4 times DAILY 100 each 2   rosuvastatin  (CRESTOR ) 10 MG tablet Take 10 mg by mouth in the morning.     SYMBICORT 160-4.5 MCG/ACT inhaler Inhale 2 puffs into the lungs 2 (two) times daily.     apixaban  (ELIQUIS ) 5 MG TABS tablet Take 1 tablet (5 mg total) by mouth 2 (two) times daily. (Patient not taking: Reported on 06/24/2024) 60 tablet 11   Current Facility-Administered Medications  Medication Dose Route Frequency Provider Last Rate Last Admin   0.9 %  sodium chloride  infusion  250 mL Intravenous PRN Sheree Penne Bruckner, MD  On ROS today: negative unless stated in HPI   Physical Examination   Vitals:   06/24/24 1318  BP: (!) 141/65  Pulse: 96  Resp: 18  Temp: 97.8 F (36.6 C)  TempSrc: Temporal  SpO2: 98%  Height: 5' (1.524 m)   Body mass index is 38.28 kg/m.  General WDWN, in no distress  Pulmonary Non labored  Cardiac Regular  Vascular 2+ radial pulse, Right upper arm loop graft with palpable thrill   Musculo- skeletal M/S 5/5 throughout  , Extremities without ischemic changes    Neurologic A&O; CN grossly intact     Non-invasive Vascular Imaging   right Arm Access Duplex  (06/24/24):  Patent arteriovenous graft with elevated velocities observed at the arterial  anastomosis. Widely patent outflow vein stent and venous  anastomosis.    Medical Decision Making   Katie Garcia is a 83 y.o. female who presents for follow up afterafter Fistulogram. On 5/19.25  he underwent 2) right arm fistulogram 3) right arm arteriovenous graft mechanical thrombectomy 4) right axillary vein angioplasty (7 x 80 mm Mustang) 5) right brachiocephalic angioplasty (14 x 40 mm Athletis) and 6) superior vena cava angioplasty (14 x 40 mm Athletis) by Dr. Magda.  Her graft has been working well since intervention. Duplex shows patent graft with some elevated velocities. Stent is patent. She is unable to afford Eliquis  at this time. Recommend continued Aspirin . She can follow up as needed.   Katie Damme, PA-C [facility] Office: [phone]  Clinic MD: Magda

## 2024-06-26 DIAGNOSIS — E1129 Type 2 diabetes mellitus with other diabetic kidney complication: Secondary | ICD-10-CM | POA: Diagnosis not present

## 2024-06-26 DIAGNOSIS — R519 Headache, unspecified: Secondary | ICD-10-CM | POA: Diagnosis not present

## 2024-06-26 DIAGNOSIS — Z992 Dependence on renal dialysis: Secondary | ICD-10-CM | POA: Diagnosis not present

## 2024-06-26 DIAGNOSIS — N2581 Secondary hyperparathyroidism of renal origin: Secondary | ICD-10-CM | POA: Diagnosis not present

## 2024-06-26 DIAGNOSIS — D509 Iron deficiency anemia, unspecified: Secondary | ICD-10-CM | POA: Diagnosis not present

## 2024-06-26 DIAGNOSIS — D631 Anemia in chronic kidney disease: Secondary | ICD-10-CM | POA: Diagnosis not present

## 2024-06-26 DIAGNOSIS — D688 Other specified coagulation defects: Secondary | ICD-10-CM | POA: Diagnosis not present

## 2024-06-26 DIAGNOSIS — N186 End stage renal disease: Secondary | ICD-10-CM | POA: Diagnosis not present

## 2024-06-28 DIAGNOSIS — N2581 Secondary hyperparathyroidism of renal origin: Secondary | ICD-10-CM | POA: Diagnosis not present

## 2024-06-28 DIAGNOSIS — D631 Anemia in chronic kidney disease: Secondary | ICD-10-CM | POA: Diagnosis not present

## 2024-06-28 DIAGNOSIS — D688 Other specified coagulation defects: Secondary | ICD-10-CM | POA: Diagnosis not present

## 2024-06-28 DIAGNOSIS — Z992 Dependence on renal dialysis: Secondary | ICD-10-CM | POA: Diagnosis not present

## 2024-06-28 DIAGNOSIS — E1129 Type 2 diabetes mellitus with other diabetic kidney complication: Secondary | ICD-10-CM | POA: Diagnosis not present

## 2024-06-28 DIAGNOSIS — D509 Iron deficiency anemia, unspecified: Secondary | ICD-10-CM | POA: Diagnosis not present

## 2024-06-28 DIAGNOSIS — N186 End stage renal disease: Secondary | ICD-10-CM | POA: Diagnosis not present

## 2024-06-28 DIAGNOSIS — R519 Headache, unspecified: Secondary | ICD-10-CM | POA: Diagnosis not present

## 2024-07-01 DIAGNOSIS — D688 Other specified coagulation defects: Secondary | ICD-10-CM | POA: Diagnosis not present

## 2024-07-01 DIAGNOSIS — N2581 Secondary hyperparathyroidism of renal origin: Secondary | ICD-10-CM | POA: Diagnosis not present

## 2024-07-01 DIAGNOSIS — E1129 Type 2 diabetes mellitus with other diabetic kidney complication: Secondary | ICD-10-CM | POA: Diagnosis not present

## 2024-07-01 DIAGNOSIS — R519 Headache, unspecified: Secondary | ICD-10-CM | POA: Diagnosis not present

## 2024-07-01 DIAGNOSIS — D631 Anemia in chronic kidney disease: Secondary | ICD-10-CM | POA: Diagnosis not present

## 2024-07-01 DIAGNOSIS — Z992 Dependence on renal dialysis: Secondary | ICD-10-CM | POA: Diagnosis not present

## 2024-07-01 DIAGNOSIS — N186 End stage renal disease: Secondary | ICD-10-CM | POA: Diagnosis not present

## 2024-07-01 DIAGNOSIS — D509 Iron deficiency anemia, unspecified: Secondary | ICD-10-CM | POA: Diagnosis not present

## 2024-07-03 DIAGNOSIS — D509 Iron deficiency anemia, unspecified: Secondary | ICD-10-CM | POA: Diagnosis not present

## 2024-07-03 DIAGNOSIS — E1129 Type 2 diabetes mellitus with other diabetic kidney complication: Secondary | ICD-10-CM | POA: Diagnosis not present

## 2024-07-03 DIAGNOSIS — N2581 Secondary hyperparathyroidism of renal origin: Secondary | ICD-10-CM | POA: Diagnosis not present

## 2024-07-03 DIAGNOSIS — N186 End stage renal disease: Secondary | ICD-10-CM | POA: Diagnosis not present

## 2024-07-03 DIAGNOSIS — D688 Other specified coagulation defects: Secondary | ICD-10-CM | POA: Diagnosis not present

## 2024-07-03 DIAGNOSIS — Z992 Dependence on renal dialysis: Secondary | ICD-10-CM | POA: Diagnosis not present

## 2024-07-03 DIAGNOSIS — D631 Anemia in chronic kidney disease: Secondary | ICD-10-CM | POA: Diagnosis not present

## 2024-07-03 DIAGNOSIS — R519 Headache, unspecified: Secondary | ICD-10-CM | POA: Diagnosis not present

## 2024-07-05 DIAGNOSIS — N2581 Secondary hyperparathyroidism of renal origin: Secondary | ICD-10-CM | POA: Diagnosis not present

## 2024-07-05 DIAGNOSIS — N186 End stage renal disease: Secondary | ICD-10-CM | POA: Diagnosis not present

## 2024-07-05 DIAGNOSIS — D509 Iron deficiency anemia, unspecified: Secondary | ICD-10-CM | POA: Diagnosis not present

## 2024-07-05 DIAGNOSIS — D631 Anemia in chronic kidney disease: Secondary | ICD-10-CM | POA: Diagnosis not present

## 2024-07-05 DIAGNOSIS — E1129 Type 2 diabetes mellitus with other diabetic kidney complication: Secondary | ICD-10-CM | POA: Diagnosis not present

## 2024-07-05 DIAGNOSIS — Z992 Dependence on renal dialysis: Secondary | ICD-10-CM | POA: Diagnosis not present

## 2024-07-05 DIAGNOSIS — R519 Headache, unspecified: Secondary | ICD-10-CM | POA: Diagnosis not present

## 2024-07-05 DIAGNOSIS — D688 Other specified coagulation defects: Secondary | ICD-10-CM | POA: Diagnosis not present

## 2024-07-08 DIAGNOSIS — N186 End stage renal disease: Secondary | ICD-10-CM | POA: Diagnosis not present

## 2024-07-08 DIAGNOSIS — D688 Other specified coagulation defects: Secondary | ICD-10-CM | POA: Diagnosis not present

## 2024-07-08 DIAGNOSIS — N2581 Secondary hyperparathyroidism of renal origin: Secondary | ICD-10-CM | POA: Diagnosis not present

## 2024-07-08 DIAGNOSIS — D509 Iron deficiency anemia, unspecified: Secondary | ICD-10-CM | POA: Diagnosis not present

## 2024-07-08 DIAGNOSIS — R519 Headache, unspecified: Secondary | ICD-10-CM | POA: Diagnosis not present

## 2024-07-08 DIAGNOSIS — D631 Anemia in chronic kidney disease: Secondary | ICD-10-CM | POA: Diagnosis not present

## 2024-07-08 DIAGNOSIS — Z992 Dependence on renal dialysis: Secondary | ICD-10-CM | POA: Diagnosis not present

## 2024-07-08 DIAGNOSIS — E1129 Type 2 diabetes mellitus with other diabetic kidney complication: Secondary | ICD-10-CM | POA: Diagnosis not present

## 2024-07-10 DIAGNOSIS — D509 Iron deficiency anemia, unspecified: Secondary | ICD-10-CM | POA: Diagnosis not present

## 2024-07-10 DIAGNOSIS — D688 Other specified coagulation defects: Secondary | ICD-10-CM | POA: Diagnosis not present

## 2024-07-10 DIAGNOSIS — N2581 Secondary hyperparathyroidism of renal origin: Secondary | ICD-10-CM | POA: Diagnosis not present

## 2024-07-10 DIAGNOSIS — N186 End stage renal disease: Secondary | ICD-10-CM | POA: Diagnosis not present

## 2024-07-10 DIAGNOSIS — R519 Headache, unspecified: Secondary | ICD-10-CM | POA: Diagnosis not present

## 2024-07-10 DIAGNOSIS — D631 Anemia in chronic kidney disease: Secondary | ICD-10-CM | POA: Diagnosis not present

## 2024-07-10 DIAGNOSIS — Z992 Dependence on renal dialysis: Secondary | ICD-10-CM | POA: Diagnosis not present

## 2024-07-10 DIAGNOSIS — E1129 Type 2 diabetes mellitus with other diabetic kidney complication: Secondary | ICD-10-CM | POA: Diagnosis not present

## 2024-07-12 DIAGNOSIS — D631 Anemia in chronic kidney disease: Secondary | ICD-10-CM | POA: Diagnosis not present

## 2024-07-12 DIAGNOSIS — D688 Other specified coagulation defects: Secondary | ICD-10-CM | POA: Diagnosis not present

## 2024-07-12 DIAGNOSIS — Z992 Dependence on renal dialysis: Secondary | ICD-10-CM | POA: Diagnosis not present

## 2024-07-12 DIAGNOSIS — N186 End stage renal disease: Secondary | ICD-10-CM | POA: Diagnosis not present

## 2024-07-12 DIAGNOSIS — R519 Headache, unspecified: Secondary | ICD-10-CM | POA: Diagnosis not present

## 2024-07-12 DIAGNOSIS — N2581 Secondary hyperparathyroidism of renal origin: Secondary | ICD-10-CM | POA: Diagnosis not present

## 2024-07-12 DIAGNOSIS — D509 Iron deficiency anemia, unspecified: Secondary | ICD-10-CM | POA: Diagnosis not present

## 2024-07-12 DIAGNOSIS — E1129 Type 2 diabetes mellitus with other diabetic kidney complication: Secondary | ICD-10-CM | POA: Diagnosis not present

## 2024-07-15 DIAGNOSIS — R519 Headache, unspecified: Secondary | ICD-10-CM | POA: Diagnosis not present

## 2024-07-15 DIAGNOSIS — D688 Other specified coagulation defects: Secondary | ICD-10-CM | POA: Diagnosis not present

## 2024-07-15 DIAGNOSIS — N2581 Secondary hyperparathyroidism of renal origin: Secondary | ICD-10-CM | POA: Diagnosis not present

## 2024-07-15 DIAGNOSIS — D509 Iron deficiency anemia, unspecified: Secondary | ICD-10-CM | POA: Diagnosis not present

## 2024-07-15 DIAGNOSIS — E1129 Type 2 diabetes mellitus with other diabetic kidney complication: Secondary | ICD-10-CM | POA: Diagnosis not present

## 2024-07-15 DIAGNOSIS — D631 Anemia in chronic kidney disease: Secondary | ICD-10-CM | POA: Diagnosis not present

## 2024-07-15 DIAGNOSIS — Z992 Dependence on renal dialysis: Secondary | ICD-10-CM | POA: Diagnosis not present

## 2024-07-15 DIAGNOSIS — N186 End stage renal disease: Secondary | ICD-10-CM | POA: Diagnosis not present

## 2024-07-17 DIAGNOSIS — Z992 Dependence on renal dialysis: Secondary | ICD-10-CM | POA: Diagnosis not present

## 2024-07-17 DIAGNOSIS — N2581 Secondary hyperparathyroidism of renal origin: Secondary | ICD-10-CM | POA: Diagnosis not present

## 2024-07-17 DIAGNOSIS — R519 Headache, unspecified: Secondary | ICD-10-CM | POA: Diagnosis not present

## 2024-07-17 DIAGNOSIS — D509 Iron deficiency anemia, unspecified: Secondary | ICD-10-CM | POA: Diagnosis not present

## 2024-07-17 DIAGNOSIS — D631 Anemia in chronic kidney disease: Secondary | ICD-10-CM | POA: Diagnosis not present

## 2024-07-17 DIAGNOSIS — D688 Other specified coagulation defects: Secondary | ICD-10-CM | POA: Diagnosis not present

## 2024-07-17 DIAGNOSIS — E1129 Type 2 diabetes mellitus with other diabetic kidney complication: Secondary | ICD-10-CM | POA: Diagnosis not present

## 2024-07-17 DIAGNOSIS — N186 End stage renal disease: Secondary | ICD-10-CM | POA: Diagnosis not present

## 2024-07-19 DIAGNOSIS — R519 Headache, unspecified: Secondary | ICD-10-CM | POA: Diagnosis not present

## 2024-07-19 DIAGNOSIS — N2581 Secondary hyperparathyroidism of renal origin: Secondary | ICD-10-CM | POA: Diagnosis not present

## 2024-07-19 DIAGNOSIS — Z992 Dependence on renal dialysis: Secondary | ICD-10-CM | POA: Diagnosis not present

## 2024-07-19 DIAGNOSIS — D509 Iron deficiency anemia, unspecified: Secondary | ICD-10-CM | POA: Diagnosis not present

## 2024-07-19 DIAGNOSIS — N186 End stage renal disease: Secondary | ICD-10-CM | POA: Diagnosis not present

## 2024-07-19 DIAGNOSIS — E1129 Type 2 diabetes mellitus with other diabetic kidney complication: Secondary | ICD-10-CM | POA: Diagnosis not present

## 2024-07-19 DIAGNOSIS — D631 Anemia in chronic kidney disease: Secondary | ICD-10-CM | POA: Diagnosis not present

## 2024-07-19 DIAGNOSIS — D688 Other specified coagulation defects: Secondary | ICD-10-CM | POA: Diagnosis not present

## 2024-07-22 DIAGNOSIS — R519 Headache, unspecified: Secondary | ICD-10-CM | POA: Diagnosis not present

## 2024-07-22 DIAGNOSIS — D509 Iron deficiency anemia, unspecified: Secondary | ICD-10-CM | POA: Diagnosis not present

## 2024-07-22 DIAGNOSIS — D688 Other specified coagulation defects: Secondary | ICD-10-CM | POA: Diagnosis not present

## 2024-07-22 DIAGNOSIS — N186 End stage renal disease: Secondary | ICD-10-CM | POA: Diagnosis not present

## 2024-07-22 DIAGNOSIS — E1129 Type 2 diabetes mellitus with other diabetic kidney complication: Secondary | ICD-10-CM | POA: Diagnosis not present

## 2024-07-22 DIAGNOSIS — Z992 Dependence on renal dialysis: Secondary | ICD-10-CM | POA: Diagnosis not present

## 2024-07-22 DIAGNOSIS — D631 Anemia in chronic kidney disease: Secondary | ICD-10-CM | POA: Diagnosis not present

## 2024-07-22 DIAGNOSIS — N2581 Secondary hyperparathyroidism of renal origin: Secondary | ICD-10-CM | POA: Diagnosis not present

## 2024-07-24 DIAGNOSIS — D509 Iron deficiency anemia, unspecified: Secondary | ICD-10-CM | POA: Diagnosis not present

## 2024-07-24 DIAGNOSIS — D688 Other specified coagulation defects: Secondary | ICD-10-CM | POA: Diagnosis not present

## 2024-07-24 DIAGNOSIS — D631 Anemia in chronic kidney disease: Secondary | ICD-10-CM | POA: Diagnosis not present

## 2024-07-24 DIAGNOSIS — R519 Headache, unspecified: Secondary | ICD-10-CM | POA: Diagnosis not present

## 2024-07-24 DIAGNOSIS — Z992 Dependence on renal dialysis: Secondary | ICD-10-CM | POA: Diagnosis not present

## 2024-07-24 DIAGNOSIS — N2581 Secondary hyperparathyroidism of renal origin: Secondary | ICD-10-CM | POA: Diagnosis not present

## 2024-07-24 DIAGNOSIS — E1122 Type 2 diabetes mellitus with diabetic chronic kidney disease: Secondary | ICD-10-CM | POA: Diagnosis not present

## 2024-07-24 DIAGNOSIS — E1129 Type 2 diabetes mellitus with other diabetic kidney complication: Secondary | ICD-10-CM | POA: Diagnosis not present

## 2024-07-24 DIAGNOSIS — N186 End stage renal disease: Secondary | ICD-10-CM | POA: Diagnosis not present

## 2024-07-26 DIAGNOSIS — N186 End stage renal disease: Secondary | ICD-10-CM | POA: Diagnosis not present

## 2024-07-26 DIAGNOSIS — D688 Other specified coagulation defects: Secondary | ICD-10-CM | POA: Diagnosis not present

## 2024-07-26 DIAGNOSIS — Z992 Dependence on renal dialysis: Secondary | ICD-10-CM | POA: Diagnosis not present

## 2024-07-26 DIAGNOSIS — E1129 Type 2 diabetes mellitus with other diabetic kidney complication: Secondary | ICD-10-CM | POA: Diagnosis not present

## 2024-07-26 DIAGNOSIS — N2581 Secondary hyperparathyroidism of renal origin: Secondary | ICD-10-CM | POA: Diagnosis not present

## 2024-07-26 DIAGNOSIS — R519 Headache, unspecified: Secondary | ICD-10-CM | POA: Diagnosis not present

## 2024-07-31 DIAGNOSIS — N186 End stage renal disease: Secondary | ICD-10-CM | POA: Diagnosis not present

## 2024-08-02 DIAGNOSIS — D688 Other specified coagulation defects: Secondary | ICD-10-CM | POA: Diagnosis not present

## 2024-08-02 DIAGNOSIS — Z992 Dependence on renal dialysis: Secondary | ICD-10-CM | POA: Diagnosis not present

## 2024-08-02 DIAGNOSIS — E1129 Type 2 diabetes mellitus with other diabetic kidney complication: Secondary | ICD-10-CM | POA: Diagnosis not present

## 2024-08-02 DIAGNOSIS — N2581 Secondary hyperparathyroidism of renal origin: Secondary | ICD-10-CM | POA: Diagnosis not present

## 2024-08-02 DIAGNOSIS — N186 End stage renal disease: Secondary | ICD-10-CM | POA: Diagnosis not present

## 2024-08-02 DIAGNOSIS — R519 Headache, unspecified: Secondary | ICD-10-CM | POA: Diagnosis not present

## 2024-08-04 ENCOUNTER — Encounter: Payer: Self-pay | Admitting: Podiatry

## 2024-08-04 ENCOUNTER — Ambulatory Visit (INDEPENDENT_AMBULATORY_CARE_PROVIDER_SITE_OTHER): Admitting: Podiatry

## 2024-08-04 DIAGNOSIS — M79674 Pain in right toe(s): Secondary | ICD-10-CM

## 2024-08-04 DIAGNOSIS — E0822 Diabetes mellitus due to underlying condition with diabetic chronic kidney disease: Secondary | ICD-10-CM | POA: Diagnosis not present

## 2024-08-04 DIAGNOSIS — Z794 Long term (current) use of insulin: Secondary | ICD-10-CM | POA: Diagnosis not present

## 2024-08-04 DIAGNOSIS — M79675 Pain in left toe(s): Secondary | ICD-10-CM

## 2024-08-04 DIAGNOSIS — B351 Tinea unguium: Secondary | ICD-10-CM

## 2024-08-04 DIAGNOSIS — Z992 Dependence on renal dialysis: Secondary | ICD-10-CM | POA: Diagnosis not present

## 2024-08-04 DIAGNOSIS — N186 End stage renal disease: Secondary | ICD-10-CM

## 2024-08-04 NOTE — Progress Notes (Signed)
 This patient returns to my office for at risk foot care.  This patient requires this care by a professional since this patient will be at risk due to having diabetes with peripheral angiopathy.  This patient is unable to cut nails herself since the patient cannot reach her nails.These nails are painful walking and wearing shoes.  This patient presents for at risk foot care today.  General Appearance  Alert, conversant and in no acute stress.  Vascular  Dorsalis pedis and posterior tibial  pulses are  weakly palpable  bilaterally.  Capillary return is within normal limits  bilaterally. Temperature is within normal limits  bilaterally.  Neurologic  Senn-Weinstein monofilament wire test within normal limits  bilaterally. Muscle power within normal limits bilaterally.  Nails Thick disfigured discolored nails with subungual debris  from hallux to fifth toes bilaterally. No evidence of bacterial infection or drainage bilaterally. Pincer hallux nails.  Orthopedic  No limitations of motion  feet .  No crepitus or effusions noted.  No bony pathology or digital deformities noted.  Skin  normotropic skin with no porokeratosis noted bilaterally.  No signs of infections or ulcers noted.     Onychomycosis  Pain in right toes  Pain in left toes  Consent was obtained for treatment procedures.   Mechanical debridement of nails 1-5  bilaterally performed with a nail nipper.  Filed with dremel without incident.    Return office visit    3 months                  Told patient to return for periodic foot care and evaluation due to potential at risk complications.   Cordella Bold DPM

## 2024-08-05 DIAGNOSIS — E1129 Type 2 diabetes mellitus with other diabetic kidney complication: Secondary | ICD-10-CM | POA: Diagnosis not present

## 2024-08-05 DIAGNOSIS — Z992 Dependence on renal dialysis: Secondary | ICD-10-CM | POA: Diagnosis not present

## 2024-08-05 DIAGNOSIS — D688 Other specified coagulation defects: Secondary | ICD-10-CM | POA: Diagnosis not present

## 2024-08-05 DIAGNOSIS — R519 Headache, unspecified: Secondary | ICD-10-CM | POA: Diagnosis not present

## 2024-08-05 DIAGNOSIS — N2581 Secondary hyperparathyroidism of renal origin: Secondary | ICD-10-CM | POA: Diagnosis not present

## 2024-08-05 DIAGNOSIS — N186 End stage renal disease: Secondary | ICD-10-CM | POA: Diagnosis not present

## 2024-08-07 DIAGNOSIS — N2581 Secondary hyperparathyroidism of renal origin: Secondary | ICD-10-CM | POA: Diagnosis not present

## 2024-08-07 DIAGNOSIS — D688 Other specified coagulation defects: Secondary | ICD-10-CM | POA: Diagnosis not present

## 2024-08-07 DIAGNOSIS — E1129 Type 2 diabetes mellitus with other diabetic kidney complication: Secondary | ICD-10-CM | POA: Diagnosis not present

## 2024-08-07 DIAGNOSIS — N186 End stage renal disease: Secondary | ICD-10-CM | POA: Diagnosis not present

## 2024-08-07 DIAGNOSIS — R519 Headache, unspecified: Secondary | ICD-10-CM | POA: Diagnosis not present

## 2024-08-09 DIAGNOSIS — D688 Other specified coagulation defects: Secondary | ICD-10-CM | POA: Diagnosis not present

## 2024-08-14 DIAGNOSIS — N186 End stage renal disease: Secondary | ICD-10-CM | POA: Diagnosis not present

## 2024-08-14 DIAGNOSIS — Z992 Dependence on renal dialysis: Secondary | ICD-10-CM | POA: Diagnosis not present

## 2024-08-14 DIAGNOSIS — E1129 Type 2 diabetes mellitus with other diabetic kidney complication: Secondary | ICD-10-CM | POA: Diagnosis not present

## 2024-08-14 DIAGNOSIS — R519 Headache, unspecified: Secondary | ICD-10-CM | POA: Diagnosis not present

## 2024-08-14 DIAGNOSIS — D688 Other specified coagulation defects: Secondary | ICD-10-CM | POA: Diagnosis not present

## 2024-08-14 DIAGNOSIS — N2581 Secondary hyperparathyroidism of renal origin: Secondary | ICD-10-CM | POA: Diagnosis not present

## 2024-08-16 DIAGNOSIS — D688 Other specified coagulation defects: Secondary | ICD-10-CM | POA: Diagnosis not present

## 2024-08-21 DIAGNOSIS — R519 Headache, unspecified: Secondary | ICD-10-CM | POA: Diagnosis not present

## 2024-08-23 DIAGNOSIS — D688 Other specified coagulation defects: Secondary | ICD-10-CM | POA: Diagnosis not present

## 2024-08-23 DIAGNOSIS — Z992 Dependence on renal dialysis: Secondary | ICD-10-CM | POA: Diagnosis not present

## 2024-08-23 DIAGNOSIS — N2581 Secondary hyperparathyroidism of renal origin: Secondary | ICD-10-CM | POA: Diagnosis not present

## 2024-08-23 DIAGNOSIS — R519 Headache, unspecified: Secondary | ICD-10-CM | POA: Diagnosis not present

## 2024-08-23 DIAGNOSIS — E1129 Type 2 diabetes mellitus with other diabetic kidney complication: Secondary | ICD-10-CM | POA: Diagnosis not present

## 2024-08-23 DIAGNOSIS — N186 End stage renal disease: Secondary | ICD-10-CM | POA: Diagnosis not present

## 2024-08-24 DIAGNOSIS — N186 End stage renal disease: Secondary | ICD-10-CM | POA: Diagnosis not present

## 2024-08-24 DIAGNOSIS — Z992 Dependence on renal dialysis: Secondary | ICD-10-CM | POA: Diagnosis not present

## 2024-08-24 DIAGNOSIS — E1122 Type 2 diabetes mellitus with diabetic chronic kidney disease: Secondary | ICD-10-CM | POA: Diagnosis not present

## 2024-08-26 DIAGNOSIS — Z992 Dependence on renal dialysis: Secondary | ICD-10-CM | POA: Diagnosis not present

## 2024-08-26 DIAGNOSIS — N186 End stage renal disease: Secondary | ICD-10-CM | POA: Diagnosis not present

## 2024-08-26 DIAGNOSIS — N2581 Secondary hyperparathyroidism of renal origin: Secondary | ICD-10-CM | POA: Diagnosis not present

## 2024-08-26 DIAGNOSIS — D688 Other specified coagulation defects: Secondary | ICD-10-CM | POA: Diagnosis not present

## 2024-08-26 DIAGNOSIS — D631 Anemia in chronic kidney disease: Secondary | ICD-10-CM | POA: Diagnosis not present

## 2024-08-26 DIAGNOSIS — E1129 Type 2 diabetes mellitus with other diabetic kidney complication: Secondary | ICD-10-CM | POA: Diagnosis not present

## 2024-08-26 DIAGNOSIS — R519 Headache, unspecified: Secondary | ICD-10-CM | POA: Diagnosis not present

## 2024-08-28 DIAGNOSIS — D688 Other specified coagulation defects: Secondary | ICD-10-CM | POA: Diagnosis not present

## 2024-08-28 DIAGNOSIS — R519 Headache, unspecified: Secondary | ICD-10-CM | POA: Diagnosis not present

## 2024-08-28 DIAGNOSIS — D631 Anemia in chronic kidney disease: Secondary | ICD-10-CM | POA: Diagnosis not present

## 2024-08-28 DIAGNOSIS — N2581 Secondary hyperparathyroidism of renal origin: Secondary | ICD-10-CM | POA: Diagnosis not present

## 2024-08-28 DIAGNOSIS — N186 End stage renal disease: Secondary | ICD-10-CM | POA: Diagnosis not present

## 2024-08-28 DIAGNOSIS — E1129 Type 2 diabetes mellitus with other diabetic kidney complication: Secondary | ICD-10-CM | POA: Diagnosis not present

## 2024-08-28 DIAGNOSIS — Z992 Dependence on renal dialysis: Secondary | ICD-10-CM | POA: Diagnosis not present

## 2024-08-30 DIAGNOSIS — N2581 Secondary hyperparathyroidism of renal origin: Secondary | ICD-10-CM | POA: Diagnosis not present

## 2024-08-30 DIAGNOSIS — N186 End stage renal disease: Secondary | ICD-10-CM | POA: Diagnosis not present

## 2024-08-30 DIAGNOSIS — E1129 Type 2 diabetes mellitus with other diabetic kidney complication: Secondary | ICD-10-CM | POA: Diagnosis not present

## 2024-08-30 DIAGNOSIS — R519 Headache, unspecified: Secondary | ICD-10-CM | POA: Diagnosis not present

## 2024-08-30 DIAGNOSIS — D631 Anemia in chronic kidney disease: Secondary | ICD-10-CM | POA: Diagnosis not present

## 2024-08-30 DIAGNOSIS — D688 Other specified coagulation defects: Secondary | ICD-10-CM | POA: Diagnosis not present

## 2024-08-30 DIAGNOSIS — Z992 Dependence on renal dialysis: Secondary | ICD-10-CM | POA: Diagnosis not present

## 2024-09-02 DIAGNOSIS — D688 Other specified coagulation defects: Secondary | ICD-10-CM | POA: Diagnosis not present

## 2024-09-02 DIAGNOSIS — E1129 Type 2 diabetes mellitus with other diabetic kidney complication: Secondary | ICD-10-CM | POA: Diagnosis not present

## 2024-09-02 DIAGNOSIS — N186 End stage renal disease: Secondary | ICD-10-CM | POA: Diagnosis not present

## 2024-09-02 DIAGNOSIS — Z992 Dependence on renal dialysis: Secondary | ICD-10-CM | POA: Diagnosis not present

## 2024-09-02 DIAGNOSIS — N2581 Secondary hyperparathyroidism of renal origin: Secondary | ICD-10-CM | POA: Diagnosis not present

## 2024-09-02 DIAGNOSIS — R519 Headache, unspecified: Secondary | ICD-10-CM | POA: Diagnosis not present

## 2024-09-02 DIAGNOSIS — D631 Anemia in chronic kidney disease: Secondary | ICD-10-CM | POA: Diagnosis not present

## 2024-09-04 DIAGNOSIS — E1129 Type 2 diabetes mellitus with other diabetic kidney complication: Secondary | ICD-10-CM | POA: Diagnosis not present

## 2024-09-04 DIAGNOSIS — R519 Headache, unspecified: Secondary | ICD-10-CM | POA: Diagnosis not present

## 2024-09-04 DIAGNOSIS — D688 Other specified coagulation defects: Secondary | ICD-10-CM | POA: Diagnosis not present

## 2024-09-04 DIAGNOSIS — Z992 Dependence on renal dialysis: Secondary | ICD-10-CM | POA: Diagnosis not present

## 2024-09-04 DIAGNOSIS — N2581 Secondary hyperparathyroidism of renal origin: Secondary | ICD-10-CM | POA: Diagnosis not present

## 2024-09-04 DIAGNOSIS — D631 Anemia in chronic kidney disease: Secondary | ICD-10-CM | POA: Diagnosis not present

## 2024-09-04 DIAGNOSIS — N186 End stage renal disease: Secondary | ICD-10-CM | POA: Diagnosis not present

## 2024-09-06 DIAGNOSIS — Z992 Dependence on renal dialysis: Secondary | ICD-10-CM | POA: Diagnosis not present

## 2024-09-06 DIAGNOSIS — N2581 Secondary hyperparathyroidism of renal origin: Secondary | ICD-10-CM | POA: Diagnosis not present

## 2024-09-06 DIAGNOSIS — D631 Anemia in chronic kidney disease: Secondary | ICD-10-CM | POA: Diagnosis not present

## 2024-09-06 DIAGNOSIS — N186 End stage renal disease: Secondary | ICD-10-CM | POA: Diagnosis not present

## 2024-09-06 DIAGNOSIS — E1129 Type 2 diabetes mellitus with other diabetic kidney complication: Secondary | ICD-10-CM | POA: Diagnosis not present

## 2024-09-06 DIAGNOSIS — R519 Headache, unspecified: Secondary | ICD-10-CM | POA: Diagnosis not present

## 2024-09-06 DIAGNOSIS — D688 Other specified coagulation defects: Secondary | ICD-10-CM | POA: Diagnosis not present

## 2024-09-09 DIAGNOSIS — N2581 Secondary hyperparathyroidism of renal origin: Secondary | ICD-10-CM | POA: Diagnosis not present

## 2024-09-09 DIAGNOSIS — D631 Anemia in chronic kidney disease: Secondary | ICD-10-CM | POA: Diagnosis not present

## 2024-09-09 DIAGNOSIS — E1129 Type 2 diabetes mellitus with other diabetic kidney complication: Secondary | ICD-10-CM | POA: Diagnosis not present

## 2024-09-09 DIAGNOSIS — R519 Headache, unspecified: Secondary | ICD-10-CM | POA: Diagnosis not present

## 2024-09-09 DIAGNOSIS — Z992 Dependence on renal dialysis: Secondary | ICD-10-CM | POA: Diagnosis not present

## 2024-09-09 DIAGNOSIS — N186 End stage renal disease: Secondary | ICD-10-CM | POA: Diagnosis not present

## 2024-09-09 DIAGNOSIS — D688 Other specified coagulation defects: Secondary | ICD-10-CM | POA: Diagnosis not present

## 2024-09-10 ENCOUNTER — Other Ambulatory Visit: Payer: Self-pay | Admitting: "Endocrinology

## 2024-09-10 DIAGNOSIS — E1151 Type 2 diabetes mellitus with diabetic peripheral angiopathy without gangrene: Secondary | ICD-10-CM

## 2024-09-11 DIAGNOSIS — N2581 Secondary hyperparathyroidism of renal origin: Secondary | ICD-10-CM | POA: Diagnosis not present

## 2024-09-11 DIAGNOSIS — E1129 Type 2 diabetes mellitus with other diabetic kidney complication: Secondary | ICD-10-CM | POA: Diagnosis not present

## 2024-09-11 DIAGNOSIS — Z992 Dependence on renal dialysis: Secondary | ICD-10-CM | POA: Diagnosis not present

## 2024-09-11 DIAGNOSIS — D688 Other specified coagulation defects: Secondary | ICD-10-CM | POA: Diagnosis not present

## 2024-09-11 DIAGNOSIS — D631 Anemia in chronic kidney disease: Secondary | ICD-10-CM | POA: Diagnosis not present

## 2024-09-11 DIAGNOSIS — N186 End stage renal disease: Secondary | ICD-10-CM | POA: Diagnosis not present

## 2024-09-11 DIAGNOSIS — R519 Headache, unspecified: Secondary | ICD-10-CM | POA: Diagnosis not present

## 2024-09-15 ENCOUNTER — Other Ambulatory Visit: Payer: Self-pay

## 2024-09-15 ENCOUNTER — Observation Stay (HOSPITAL_COMMUNITY)
Admission: EM | Admit: 2024-09-15 | Discharge: 2024-09-18 | Disposition: A | Discharge status: Home / Self Care | Attending: Internal Medicine | Admitting: Internal Medicine

## 2024-09-15 ENCOUNTER — Encounter (HOSPITAL_COMMUNITY): Payer: Self-pay

## 2024-09-15 DIAGNOSIS — I160 Hypertensive urgency: Secondary | ICD-10-CM | POA: Diagnosis not present

## 2024-09-15 DIAGNOSIS — N2581 Secondary hyperparathyroidism of renal origin: Secondary | ICD-10-CM | POA: Insufficient documentation

## 2024-09-15 DIAGNOSIS — T82868A Thrombosis of vascular prosthetic devices, implants and grafts, initial encounter: Secondary | ICD-10-CM | POA: Diagnosis not present

## 2024-09-15 DIAGNOSIS — Z7982 Long term (current) use of aspirin: Secondary | ICD-10-CM | POA: Insufficient documentation

## 2024-09-15 DIAGNOSIS — E118 Type 2 diabetes mellitus with unspecified complications: Secondary | ICD-10-CM

## 2024-09-15 DIAGNOSIS — E1122 Type 2 diabetes mellitus with diabetic chronic kidney disease: Secondary | ICD-10-CM | POA: Diagnosis not present

## 2024-09-15 DIAGNOSIS — I770 Arteriovenous fistula, acquired: Secondary | ICD-10-CM | POA: Insufficient documentation

## 2024-09-15 DIAGNOSIS — Z794 Long term (current) use of insulin: Secondary | ICD-10-CM | POA: Insufficient documentation

## 2024-09-15 DIAGNOSIS — E1165 Type 2 diabetes mellitus with hyperglycemia: Secondary | ICD-10-CM | POA: Diagnosis not present

## 2024-09-15 DIAGNOSIS — T82590A Other mechanical complication of surgically created arteriovenous fistula, initial encounter: Secondary | ICD-10-CM | POA: Diagnosis not present

## 2024-09-15 DIAGNOSIS — E785 Hyperlipidemia, unspecified: Secondary | ICD-10-CM | POA: Diagnosis not present

## 2024-09-15 DIAGNOSIS — N186 End stage renal disease: Principal | ICD-10-CM | POA: Insufficient documentation

## 2024-09-15 DIAGNOSIS — D649 Anemia, unspecified: Secondary | ICD-10-CM | POA: Diagnosis not present

## 2024-09-15 DIAGNOSIS — Z6835 Body mass index (BMI) 35.0-35.9, adult: Secondary | ICD-10-CM | POA: Insufficient documentation

## 2024-09-15 DIAGNOSIS — E66812 Obesity, class 2: Secondary | ICD-10-CM | POA: Insufficient documentation

## 2024-09-15 DIAGNOSIS — M109 Gout, unspecified: Secondary | ICD-10-CM | POA: Diagnosis not present

## 2024-09-15 DIAGNOSIS — I120 Hypertensive chronic kidney disease with stage 5 chronic kidney disease or end stage renal disease: Secondary | ICD-10-CM | POA: Insufficient documentation

## 2024-09-15 DIAGNOSIS — Z992 Dependence on renal dialysis: Secondary | ICD-10-CM | POA: Diagnosis not present

## 2024-09-15 DIAGNOSIS — T82898A Other specified complication of vascular prosthetic devices, implants and grafts, initial encounter: Secondary | ICD-10-CM | POA: Diagnosis not present

## 2024-09-15 LAB — BASIC METABOLIC PANEL WITH GFR
Anion gap: 18 — ABNORMAL HIGH (ref 5–15)
BUN: 85 mg/dL — ABNORMAL HIGH (ref 8–23)
CO2: 20 mmol/L — ABNORMAL LOW (ref 22–32)
Calcium: 9.1 mg/dL (ref 8.9–10.3)
Chloride: 101 mmol/L (ref 98–111)
Creatinine, Ser: 12.76 mg/dL — ABNORMAL HIGH (ref 0.44–1.00)
GFR, Estimated: 3 mL/min — ABNORMAL LOW (ref >60)
Glucose, Bld: 182 mg/dL — ABNORMAL HIGH (ref 70–99)
Potassium: 4.9 mmol/L (ref 3.5–5.1)
Sodium: 139 mmol/L (ref 135–145)

## 2024-09-15 LAB — I-STAT CHEM 8, ED
BUN: 74 mg/dL — ABNORMAL HIGH (ref 8–23)
Calcium, Ion: 1.01 mmol/L — ABNORMAL LOW (ref 1.15–1.40)
Chloride: 108 mmol/L (ref 98–111)
Creatinine, Ser: 14 mg/dL — ABNORMAL HIGH (ref 0.44–1.00)
Glucose, Bld: 182 mg/dL — ABNORMAL HIGH (ref 70–99)
HCT: 33 % — ABNORMAL LOW (ref 36.0–46.0)
Hemoglobin: 11.2 g/dL — ABNORMAL LOW (ref 12.0–15.0)
Potassium: 4.8 mmol/L (ref 3.5–5.1)
Sodium: 139 mmol/L (ref 135–145)
TCO2: 20 mmol/L — ABNORMAL LOW (ref 22–32)

## 2024-09-15 LAB — CBC
HCT: 34.1 % — ABNORMAL LOW (ref 36.0–46.0)
Hemoglobin: 10.9 g/dL — ABNORMAL LOW (ref 12.0–15.0)
MCH: 28.9 pg (ref 26.0–34.0)
MCHC: 32 g/dL (ref 30.0–36.0)
MCV: 90.5 fL (ref 80.0–100.0)
Platelets: 226 K/uL (ref 150–400)
RBC: 3.77 MIL/uL — ABNORMAL LOW (ref 3.87–5.11)
RDW: 17.1 % — ABNORMAL HIGH (ref 11.5–15.5)
WBC: 5.2 K/uL (ref 4.0–10.5)
nRBC: 0 % (ref 0.0–0.2)

## 2024-09-15 LAB — MRSA NEXT GEN BY PCR, NASAL: MRSA by PCR Next Gen: NOT DETECTED

## 2024-09-15 LAB — HEPATITIS B SURFACE ANTIGEN: Hepatitis B Surface Ag: NONREACTIVE

## 2024-09-15 LAB — GLUCOSE, CAPILLARY
Glucose-Capillary: 140 mg/dL — ABNORMAL HIGH (ref 70–99)
Glucose-Capillary: 200 mg/dL — ABNORMAL HIGH (ref 70–99)

## 2024-09-15 MED ORDER — CALCIUM ACETATE (PHOS BINDER) 667 MG PO CAPS
1334.0000 mg | ORAL_CAPSULE | Freq: Three times a day (TID) | ORAL | Status: DC
  Administered 2024-09-15 – 2024-09-18 (×4): 1334 mg via ORAL
  Filled 2024-09-15 (×5): qty 2

## 2024-09-15 MED ORDER — ONDANSETRON HCL 4 MG/2ML IJ SOLN: 4.0000 mg | Freq: Four times a day (QID) | INTRAMUSCULAR | Status: DC | PRN

## 2024-09-15 MED ORDER — ROSUVASTATIN CALCIUM 5 MG PO TABS
10.0000 mg | ORAL_TABLET | Freq: Every morning | ORAL | Status: DC
  Administered 2024-09-16 – 2024-09-18 (×2): 10 mg via ORAL
  Filled 2024-09-15 (×2): qty 2

## 2024-09-15 MED ORDER — LORATADINE 10 MG PO TABS: 10.0000 mg | ORAL_TABLET | Freq: Every day | ORAL | Status: DC | PRN

## 2024-09-15 MED ORDER — CHLORHEXIDINE GLUCONATE CLOTH 2 % EX PADS
6.0000 | MEDICATED_PAD | Freq: Every day | CUTANEOUS | Status: DC
  Administered 2024-09-16 – 2024-09-18 (×3): 6 via TOPICAL

## 2024-09-15 MED ORDER — HEPARIN SODIUM (PORCINE) 5000 UNIT/ML IJ SOLN
5000.0000 [IU] | Freq: Three times a day (TID) | INTRAMUSCULAR | Status: DC
  Administered 2024-09-15 – 2024-09-18 (×6): 5000 [IU] via SUBCUTANEOUS
  Filled 2024-09-15 (×6): qty 1

## 2024-09-15 MED ORDER — INSULIN GLARGINE 100 UNIT/ML ~~LOC~~ SOLN
20.0000 [IU] | Freq: Every day | SUBCUTANEOUS | Status: DC
  Administered 2024-09-15 – 2024-09-17 (×3): 20 [IU] via SUBCUTANEOUS
  Filled 2024-09-15 (×4): qty 0.2

## 2024-09-15 MED ORDER — INSULIN GLARGINE-YFGN 100 UNIT/ML ~~LOC~~ SOLN: 20.0000 [IU] | Freq: Every day | SUBCUTANEOUS | Status: DC

## 2024-09-15 MED ORDER — RENA-VITE PO TABS
1.0000 | ORAL_TABLET | Freq: Every evening | ORAL | Status: DC
  Administered 2024-09-15 – 2024-09-17 (×3): 1 via ORAL
  Filled 2024-09-15 (×4): qty 1

## 2024-09-15 MED ORDER — ASPIRIN 81 MG PO TBEC
81.0000 mg | DELAYED_RELEASE_TABLET | Freq: Every day | ORAL | Status: DC
  Administered 2024-09-15 – 2024-09-18 (×2): 81 mg via ORAL
  Filled 2024-09-15 (×3): qty 1

## 2024-09-15 MED ORDER — HYDRALAZINE HCL 20 MG/ML IJ SOLN
10.0000 mg | INTRAMUSCULAR | Status: DC | PRN
  Administered 2024-09-16 (×2): 10 mg via INTRAVENOUS
  Filled 2024-09-15 (×2): qty 1

## 2024-09-15 MED ORDER — ONDANSETRON HCL 4 MG PO TABS: 4.0000 mg | ORAL_TABLET | Freq: Four times a day (QID) | ORAL | Status: DC | PRN

## 2024-09-15 MED ORDER — ALLOPURINOL 100 MG PO TABS
100.0000 mg | ORAL_TABLET | ORAL | Status: DC
  Administered 2024-09-16: 100 mg via ORAL
  Filled 2024-09-15: qty 1

## 2024-09-15 MED ORDER — CARVEDILOL 6.25 MG PO TABS
6.2500 mg | ORAL_TABLET | Freq: Two times a day (BID) | ORAL | Status: DC
  Administered 2024-09-15 – 2024-09-18 (×4): 6.25 mg via ORAL
  Filled 2024-09-15 (×5): qty 1

## 2024-09-15 MED ORDER — CALCITRIOL 0.5 MCG PO CAPS: 0.7500 ug | ORAL_CAPSULE | ORAL | Status: DC

## 2024-09-15 MED ORDER — ALBUTEROL SULFATE (2.5 MG/3ML) 0.083% IN NEBU: 2.5000 mg | INHALATION_SOLUTION | Freq: Four times a day (QID) | RESPIRATORY_TRACT | Status: DC | PRN

## 2024-09-15 NOTE — ED Notes (Signed)
 CCMD called.

## 2024-09-15 NOTE — ED Triage Notes (Signed)
 Pt states dialysis center sent pt over here to get dialysis port de clogged. Last full dialysis tx was Thursday.  Denies pain to site. Denies CP and SHOB. Axox4.

## 2024-09-15 NOTE — Consult Note (Signed)
 Reason for Consult: Continuity of ESRD care Referring Physician: Maximino Sharps, MD [facility])  HPI:  83 year old woman with past medical history significant for type 2 diabetes mellitus, hypertension, gout and end-stage renal disease on hemodialysis on a TTS schedule.  She was directed to the emergency room from her outpatient dialysis unit today after her right upper arm graft was found thrombosed on Saturday, 09/13/2024.  Her last hemodialysis treatment was uneventful on 09/11/2024.  She denies any complaints other than for her thrombosed graft.  She specifically denies any chest pain or shortness of breath and does not have any fever or chills.  Dialysis prescription: [facility], TTS, 4 hours, Optiflux 180, BFR 400/DFR 600, right upper arm loop graft, EDW 88.6 kg, 3K/2.5 calcium , UF profile #4, 15G needles, heparin  8400 units bolus, Mircera 30 mcg IV every 4 weeks, calcitriol  0.75 mcg 3 times weekly  Past Medical History:  Diagnosis Date   Anemia    Arthritis    COVID    hospitalized   Diabetes mellitus without complication (HCC)    ESRD    Dialysis T/Th/Sa [address]   GERD (gastroesophageal reflux disease)    Gout    Headache    Hypertension    Type 2 diabetes mellitus [facility])     Past Surgical History:  Procedure Laterality Date   A/V FISTULAGRAM Left 04/05/2022   Procedure: A/V Fistulagram;  Surgeon: Lanis Fonda BRAVO, MD;  Location: [facility] INVASIVE CV LAB;  Service: Cardiovascular;  Laterality: Left;   A/V FISTULAGRAM Right 04/14/2024   Procedure: A/V Fistulagram;  Surgeon: Sheree Penne Bruckner, MD;  Location: [facility] INVASIVE CV LAB;  Service: Cardiovascular;  Laterality: Right;   A/V FISTULAGRAM  05/12/2024   Procedure: A/V Fistulagram;  Surgeon: Magda Debby SAILOR, MD;  Location: [facility] PV LAB;  Service: Cardiovascular;;   A/V SHUNT INTERVENTION N/A 05/12/2024   Procedure: A/V SHUNT INTERVENTION;  Surgeon: Magda Debby SAILOR, MD;  Location: [facility] PV LAB;  Service:  Cardiovascular;  Laterality: N/A;   ABDOMINAL HYSTERECTOMY     AV FISTULA PLACEMENT Left 10/02/2016   Procedure: ARTERIOVENOUS (AV) FISTULA CREATION LEFT UPPER ARM;  Surgeon: Penne Bruckner Sheree, MD;  Location: [facility] OR;  Service: Vascular;  Laterality: Left;   AV FISTULA PLACEMENT Right 07/14/2020   Procedure: RIGHT ARM ARTERIOVENOUS FISTULA CREATION;  Surgeon: Sheree Penne Bruckner, MD;  Location: [facility] OR;  Service: Vascular;  Laterality: Right;   AV FISTULA PLACEMENT Left 05/16/2021   Procedure: INSERTION OF LEFT UPPER ARM ARTERIOVENOUS (AV) GORE-TEX GRAFT;  Surgeon: Gretta Bruckner PARAS, MD;  Location: [facility] OR;  Service: Vascular;  Laterality: Left;   AV FISTULA PLACEMENT Right 07/13/2021   Procedure: RIGHT ARM ARTERIOVENOUS (AV) FISTULA GRAFT INSERTION;  Surgeon: Sheree Penne Bruckner, MD;  Location: [facility] OR;  Service: Vascular;  Laterality: Right;   AV FISTULA PLACEMENT Left 02/03/2022   Procedure: LEFT UPPER ARM ARTERIOVENOUS FISTULA CREATION;  Surgeon: Sheree Penne Bruckner, MD;  Location: [facility] OR;  Service: Vascular;  Laterality: Left;   AV FISTULA PLACEMENT Left 06/23/2022   Procedure: INSERTION OF LEFT ARM ARTERIOVENOUS (AV) GORE-TEX GRAFT;  Surgeon: Sheree Penne Bruckner, MD;  Location: [facility] OR;  Service: Vascular;  Laterality: Left;   AV FISTULA PLACEMENT Right 03/07/2024   Procedure: INSERTION OF ARTERIOVENOUS (AV) GORE-TEX GRAFT ARM;  Surgeon: Sheree Penne Bruckner, MD;  Location: [facility] OR;  Service: Vascular;  Laterality: Right;   BASCILIC VEIN TRANSPOSITION Left 05/17/2020   Procedure: Insertion of Left arm arteriovenous gortex graftarm ;  Surgeon: Eliza Lonni RAMAN, MD;  Location: [facility] OR;  Service: Vascular;  Laterality: Left;   CESAREAN SECTION     DIALYSIS/PERMA CATHETER INSERTION     DIALYSIS/PERMA CATHETER INSERTION  12/10/2023   Procedure: DIALYSIS/PERMA CATHETER INSERTION;  Surgeon: Pearline Norman RAMAN, MD;  Location: [facility] INVASIVE CV LAB;  Service: Cardiovascular;;    DIALYSIS/PERMA CATHETER INSERTION N/A 01/18/2024   Procedure: DIALYSIS/PERMA CATHETER INSERTION;  Surgeon: Norine Manuelita LABOR, MD;  Location: [facility] INVASIVE CV LAB;  Service: Cardiovascular;  Laterality: N/A;   IR FLUORO GUIDE CV LINE LEFT  02/09/2022   IR PTA VENOUS EXCEPT DIALYSIS CIRCUIT  02/09/2022   IR VENOCAVAGRAM SVC  02/09/2022   PERIPHERAL VASCULAR THROMBECTOMY N/A 12/10/2023   Procedure: PERIPHERAL VASCULAR THROMBECTOMY;  Surgeon: Pearline Norman RAMAN, MD;  Location: [facility] INVASIVE CV LAB;  Service: Cardiovascular;  Laterality: N/A;   PERIPHERAL VASCULAR THROMBECTOMY  05/12/2024   Procedure: PERIPHERAL VASCULAR THROMBECTOMY;  Surgeon: Magda Debby SAILOR, MD;  Location: [facility] LAB;  Service: Cardiovascular;;   REVISON OF ARTERIOVENOUS FISTULA Right 09/15/2020   Procedure: REVISON OF RIGHT UPPER ARM  ARTERIOVENOUS FISTULA;  Surgeon: Sheree Penne Lonni, MD;  Location: [facility] OR;  Service: Vascular;  Laterality: Right;   UPPER EXTREMITY VENOGRAPHY Bilateral 04/18/2021   Procedure: UPPER EXTREMITY VENOGRAPHY;  Surgeon: Sheree Penne Lonni, MD;  Location: [facility] INVASIVE CV LAB;  Service: Cardiovascular;  Laterality: Bilateral;   UPPER EXTREMITY VENOGRAPHY Left 01/23/2022   Procedure: UPPER EXTREMITY VENOGRAPHY;  Surgeon: Sheree Penne Lonni, MD;  Location: [facility] INVASIVE CV LAB;  Service: Cardiovascular;  Laterality: Left;   UPPER EXTREMITY VENOGRAPHY Bilateral 02/25/2024   Procedure: UPPER EXTREMITY VENOGRAPHY;  Surgeon: Sheree Penne Lonni, MD;  Location: [facility] INVASIVE CV LAB;  Service: Cardiovascular;  Laterality: Bilateral;    Family History  Problem Relation Age of Onset   Diabetes Mother     Social History:  reports that she has never smoked. She has never used smokeless tobacco. She reports that she does not currently use alcohol. She reports that she does not use drugs.  Allergies:  Allergies  Allergen Reactions   Penicillins Rash    Medications: I have reviewed the patient's  current medications. Scheduled:  [START ON 09/16/2024] allopurinol   100 mg Oral Q T,Th,Sat-1800   aspirin  EC  81 mg Oral Daily   calcium  acetate  1,334 mg Oral TID WC   carvedilol   6.25 mg Oral BID WC   heparin   5,000 Units Subcutaneous Q8H   insulin  glargine-yfgn  20 Units Subcutaneous QHS   multivitamin  1 tablet Oral QPM   [START ON 09/16/2024] rosuvastatin   10 mg Oral q AM      Latest Ref Rng & Units 09/15/2024   10:54 AM 09/15/2024   10:37 AM 04/14/2024    9:37 AM  BMP  Glucose 70 - 99 mg/dL 817  817  869   BUN 8 - 23 mg/dL 74  85  46   Creatinine 0.44 - 1.00 mg/dL 85.99  87.23  1.09   Sodium 135 - 145 mmol/L 139  139  139   Potassium 3.5 - 5.1 mmol/L 4.8  4.9  3.5   Chloride 98 - 111 mmol/L 108  101  102   CO2 22 - 32 mmol/L  20    Calcium  8.9 - 10.3 mg/dL  9.1        Latest Ref Rng & Units 09/15/2024   10:54 AM 09/15/2024   10:37 AM 04/14/2024    9:37 AM  CBC  WBC 4.0 - 10.5 K/uL  5.2    Hemoglobin 12.0 - 15.0 g/dL 88.7  89.0  87.7   Hematocrit 36.0 - 46.0 % 33.0  34.1  36.0   Platelets 150 - 400 K/uL  226       No results found.  Review of Systems  All other systems reviewed and are negative.  Blood pressure (!) 168/92, pulse 78, temperature 98.3 F (36.8 C), temperature source Oral, resp. rate 15, height 5' (1.524 m), weight 87.1 kg, SpO2 100%. Physical Exam Vitals and nursing note reviewed.  Constitutional:      General: She is not in acute distress.    Appearance: Normal appearance. She is obese. She is not ill-appearing.  HENT:     Head: Normocephalic and atraumatic.     Right Ear: External ear normal.     Left Ear: External ear normal.     Nose: Nose normal.     Mouth/Throat:     Mouth: Mucous membranes are dry.     Pharynx: Oropharynx is clear.  Eyes:     Extraocular Movements: Extraocular movements intact.     Conjunctiva/sclera: Conjunctivae normal.  Cardiovascular:     Rate and Rhythm: Normal rate and regular rhythm.     Pulses: Normal pulses.      Heart sounds: Normal heart sounds.  Pulmonary:     Effort: Pulmonary effort is normal.     Breath sounds: Normal breath sounds. No wheezing or rales.  Abdominal:     General: Abdomen is flat. Bowel sounds are normal.     Palpations: Abdomen is soft.     Tenderness: There is no abdominal tenderness.  Musculoskeletal:     Cervical back: Normal range of motion and neck supple.     Right lower leg: No edema.     Left lower leg: No edema.     Comments: Right upper arm loop graft thrombosed, no audible bruit/palpable thrill  Skin:    General: Skin is warm and dry.  Neurological:     Mental Status: She is alert and oriented to person, place, and time. Mental status is at baseline.     Assessment/Plan: 1.  Thrombosed right upper arm loop graft: Awaiting evaluation by interventional radiology with potential schedule for thrombectomy tomorrow and placement of dialysis catheter if unsuccessful in establishing patency. 2.  End-stage renal disease: No acute/compelling indications for dialysis at this time and will order for hemodialysis again tomorrow to resume her outpatient TTS schedule. 3.  Hypertension: Marginally elevated blood pressure, resume antihypertensive therapy and monitor with ultrafiltration and hemodialysis. 4.  Anemia: Hemoglobin and hematocrit currently at goal, no indication for ESA therapy at this time. 5.  Secondary hyperparathyroidism: Will follow calcium /phosphorus levels while in the hospital and resume binders along with calcitriol .  Gordy MARLA Blanch 09/15/2024, 3:35 PM

## 2024-09-15 NOTE — H&P (Signed)
 History and Physical    Patient: Katie Garcia FMW:[id number] DOB: [birth year] DOA: 09/15/2024 DOS: the patient was seen and examined on 09/15/2024 PCP: Elliot Charm, MD  Patient coming from: Home  Chief Complaint:  Chief Complaint  Patient presents with   Vascular Access Problem   HPI: Katie Garcia is a 83 y.o. female with medical history significant of ESRD on HD(TTS), hypertension, diabetes mellitus type 2, and gout presents with a clogged arteriovenous fistula, preventing dialysis access.  She has been unable to undergo dialysis since her last session on Thursday of last week due to a clogged fistula. Attempts to access the fistula on Saturday were unsuccessful. This issue has occurred before, with the last similar incident last year. She does not have a temporary access currently but has had two catheters placed in the past, one in each arm.  Her current medications include allopurinol  100 mg taken on Tuesday, Thursday, and Saturday at 6 PM, aspirin  81 mg daily, carvedilol  6.25 mg twice daily, Crestor  10 mg every morning, and insulin . She takes 20 units of long-acting insulin  at bedtime and 20 units of short-acting insulin  with breakfast. She denies any recent hypoglycemic episodes. She was given a prescription for Eliquis  (apixaban ) but never took it, as she was unable to obtain the medication due to financial constraints, and has only been taking aspirin . She uses Symbicort as needed but has not used it recently.  In the emergency department patient was noted to be afebrile with blood pressures elevated up to 202/97, and all other vital signs maintained.  Labs significant for potassium 4.9, CO2 20, BUN 85, creatinine 12.76, glucose 182, and anion gap 18.  Case have been discussed with vascular surgery Dr. Silver who recommended interventional radiology. Dr. Luverne of interventional radiology consulted and notes likely we will be able to be declotted tomorrow.  Review of  Systems: As mentioned in the history of present illness. All other systems reviewed and are negative. Past Medical History:  Diagnosis Date   Anemia    Arthritis    COVID    hospitalized   Diabetes mellitus without complication (HCC)    ESRD    Dialysis T/Th/Sa at [address]   GERD (gastroesophageal reflux disease)    Gout    Headache    Hypertension    Type 2 diabetes mellitus [facility])    Past Surgical History:  Procedure Laterality Date   A/V FISTULAGRAM Left 04/05/2022   Procedure: A/V Fistulagram;  Surgeon: Lanis Fonda BRAVO, MD;  Location: [facility] INVASIVE CV LAB;  Service: Cardiovascular;  Laterality: Left;   A/V FISTULAGRAM Right 04/14/2024   Procedure: A/V Fistulagram;  Surgeon: Sheree Penne Bruckner, MD;  Location: [facility] INVASIVE CV LAB;  Service: Cardiovascular;  Laterality: Right;   A/V FISTULAGRAM  05/12/2024   Procedure: A/V Fistulagram;  Surgeon: Magda Debby SAILOR, MD;  Location: [facility] PV LAB;  Service: Cardiovascular;;   A/V SHUNT INTERVENTION N/A 05/12/2024   Procedure: A/V SHUNT INTERVENTION;  Surgeon: Magda Debby SAILOR, MD;  Location: [facility] PV LAB;  Service: Cardiovascular;  Laterality: N/A;   ABDOMINAL HYSTERECTOMY     AV FISTULA PLACEMENT Left 10/02/2016   Procedure: ARTERIOVENOUS (AV) FISTULA CREATION LEFT UPPER ARM;  Surgeon: Penne Bruckner Sheree, MD;  Location: [facility] OR;  Service: Vascular;  Laterality: Left;   AV FISTULA PLACEMENT Right 07/14/2020   Procedure: RIGHT ARM ARTERIOVENOUS FISTULA CREATION;  Surgeon: Sheree Penne Bruckner, MD;  Location: [facility] OR;  Service: Vascular;  Laterality: Right;   AV FISTULA  PLACEMENT Left 05/16/2021   Procedure: INSERTION OF LEFT UPPER ARM ARTERIOVENOUS (AV) GORE-TEX GRAFT;  Surgeon: Gretta Lonni PARAS, MD;  Location: [facility] OR;  Service: Vascular;  Laterality: Left;   AV FISTULA PLACEMENT Right 07/13/2021   Procedure: RIGHT ARM ARTERIOVENOUS (AV) FISTULA GRAFT INSERTION;  Surgeon: Sheree Penne Lonni, MD;  Location: [facility] OR;  Service:  Vascular;  Laterality: Right;   AV FISTULA PLACEMENT Left 02/03/2022   Procedure: LEFT UPPER ARM ARTERIOVENOUS FISTULA CREATION;  Surgeon: Sheree Penne Lonni, MD;  Location: [facility] OR;  Service: Vascular;  Laterality: Left;   AV FISTULA PLACEMENT Left 06/23/2022   Procedure: INSERTION OF LEFT ARM ARTERIOVENOUS (AV) GORE-TEX GRAFT;  Surgeon: Sheree Penne Lonni, MD;  Location: [facility] OR;  Service: Vascular;  Laterality: Left;   AV FISTULA PLACEMENT Right 03/07/2024   Procedure: INSERTION OF ARTERIOVENOUS (AV) GORE-TEX GRAFT ARM;  Surgeon: Sheree Penne Lonni, MD;  Location: [facility] OR;  Service: Vascular;  Laterality: Right;   BASCILIC VEIN TRANSPOSITION Left 05/17/2020   Procedure: Insertion of Left arm arteriovenous gortex graftarm ;  Surgeon: Eliza Lonni RAMAN, MD;  Location: [facility] OR;  Service: Vascular;  Laterality: Left;   CESAREAN SECTION     DIALYSIS/PERMA CATHETER INSERTION     DIALYSIS/PERMA CATHETER INSERTION  12/10/2023   Procedure: DIALYSIS/PERMA CATHETER INSERTION;  Surgeon: Pearline Norman RAMAN, MD;  Location: [facility] INVASIVE CV LAB;  Service: Cardiovascular;;   DIALYSIS/PERMA CATHETER INSERTION N/A 01/18/2024   Procedure: DIALYSIS/PERMA CATHETER INSERTION;  Surgeon: Norine Manuelita LABOR, MD;  Location: [facility] INVASIVE CV LAB;  Service: Cardiovascular;  Laterality: N/A;   IR FLUORO GUIDE CV LINE LEFT  02/09/2022   IR PTA VENOUS EXCEPT DIALYSIS CIRCUIT  02/09/2022   IR VENOCAVAGRAM SVC  02/09/2022   PERIPHERAL VASCULAR THROMBECTOMY N/A 12/10/2023   Procedure: PERIPHERAL VASCULAR THROMBECTOMY;  Surgeon: Pearline Norman RAMAN, MD;  Location: [facility] INVASIVE CV LAB;  Service: Cardiovascular;  Laterality: N/A;   PERIPHERAL VASCULAR THROMBECTOMY  05/12/2024   Procedure: PERIPHERAL VASCULAR THROMBECTOMY;  Surgeon: Magda Debby SAILOR, MD;  Location: [facility] PV LAB;  Service: Cardiovascular;;   REVISON OF ARTERIOVENOUS FISTULA Right 09/15/2020   Procedure: REVISON OF RIGHT UPPER ARM  ARTERIOVENOUS FISTULA;  Surgeon:  Sheree Penne Lonni, MD;  Location: [facility] OR;  Service: Vascular;  Laterality: Right;   UPPER EXTREMITY VENOGRAPHY Bilateral 04/18/2021   Procedure: UPPER EXTREMITY VENOGRAPHY;  Surgeon: Sheree Penne Lonni, MD;  Location: [facility] INVASIVE CV LAB;  Service: Cardiovascular;  Laterality: Bilateral;   UPPER EXTREMITY VENOGRAPHY Left 01/23/2022   Procedure: UPPER EXTREMITY VENOGRAPHY;  Surgeon: Sheree Penne Lonni, MD;  Location: [facility] INVASIVE CV LAB;  Service: Cardiovascular;  Laterality: Left;   UPPER EXTREMITY VENOGRAPHY Bilateral 02/25/2024   Procedure: UPPER EXTREMITY VENOGRAPHY;  Surgeon: Sheree Penne Lonni, MD;  Location: [facility] INVASIVE CV LAB;  Service: Cardiovascular;  Laterality: Bilateral;   Social History:  reports that she has never smoked. She has never used smokeless tobacco. She reports that she does not currently use alcohol. She reports that she does not use drugs.  Allergies  Allergen Reactions   Penicillins Rash    Did it involve swelling of the face/tongue/throat, SOB, or low BP? No Did it involve sudden or severe rash/hives, skin peeling, or any reaction on the inside of your mouth or nose? Yes Did you need to seek medical attention at a hospital or doctor's office? N/A When did it last happen? N/A      If all above answers are NO, may proceed with cephalosporin use.  Family History  Problem Relation Age of Onset   Diabetes Mother     Prior to Admission medications   Medication Sig Start Date End Date Taking? Authorizing Provider  Accu-Chek FastClix Lancets MISC  03/31/18   [provider]  ACCU-CHEK GUIDE test strip 1 each by Other route in the morning and at bedtime. E11.22 10/06/18   [provider]  acetaminophen  (TYLENOL ) 500 MG tablet Take 500-1,000 mg by mouth every 6 (six) hours as needed for moderate pain or headache.    [provider]  allopurinol  (ZYLOPRIM ) 100 MG tablet Take 100 mg by mouth every Tuesday, Thursday, and  Saturday at 6 PM. In the morning after dialysis    [provider]  apixaban  (ELIQUIS ) 5 MG TABS tablet Take 1 tablet (5 mg total) by mouth 2 (two) times daily. 05/12/24   Magda Debby SAILOR, MD  aspirin  EC 81 MG tablet Take 1 tablet (81 mg total) by mouth daily. Swallow whole. 05/12/24 05/12/25  Magda Debby SAILOR, MD  B Complex-C-Folic Acid  (DIALYVITE 800) 0.8 MG TABS Take 1 tablet by mouth every evening. 02/21/24   [provider]  Blood Glucose Monitoring Suppl (ACCU-CHEK GUIDE) w/Device KIT     [provider]  calcium  acetate (PHOSLO ) 667 MG capsule Take 1,334 mg by mouth 3 (three) times daily with meals. 10/24/18   [provider]  carvedilol  (COREG ) 12.5 MG tablet Take 6.25 mg by mouth in the morning and at bedtime.    [provider]  Continuous Glucose Receiver (DEXCOM G7 RECEIVER) DEVI Use to monitor blood sugar. 05/15/23   Von Pacific, MD  Continuous Glucose Sensor (DEXCOM G7 SENSOR) MISC CHANGE EVERY 10 DAYS 09/10/24   Dartha Ernst, MD  guaifenesin  (ROBITUSSIN) 100 MG/5ML syrup Take 100 mg by mouth 3 (three) times daily as needed for cough.    [provider]  insulin  glargine (LANTUS  SOLOSTAR) 100 UNIT/ML Solostar Pen INJECT 30 UNITS INTO THE SKIN DAILY. ADJUST AS DIRECTED Patient taking differently: Inject 20 Units into the skin at bedtime. Adjust as directed 06/04/23   Von Pacific, MD  Insulin  Pen Needle 31G X 5 MM MISC Use for insulin  10/20/22   Von Pacific, MD  insulin  regular (NOVOLIN R) 100 units/mL injection Inject 20 Units into the skin daily before breakfast. 02/20/23   [provider]  loratadine  (CLARITIN ) 10 MG tablet Take 10 mg by mouth daily. 11/12/20   [provider]  RELION INSULIN  SYR 0.5ML/31G 31G X 5/16 0.5 ML MISC USE 4 times DAILY 10/03/22   Von Pacific, MD  rosuvastatin  (CRESTOR ) 10 MG tablet Take 10 mg by mouth in the morning.    [provider]  SYMBICORT 160-4.5 MCG/ACT inhaler Inhale 2  puffs into the lungs 2 (two) times daily.    [provider]    Physical Exam: Vitals:   09/15/24 1029 09/15/24 1033  BP:  (!) 202/97  Pulse:  86  Resp:  16  Temp:  98.2 F (36.8 C)  SpO2:  100%  Weight: 87.1 kg   Height: 5' (1.524 m)      Constitutional: Elderly female currently in no acute distress Eyes: PERRL, lids and conjunctivae normal ENMT: Mucous membranes are moist. Posterior pharynx clear of any exudate or lesions.Normal dentition.  Neck: normal, supple, no masses, no thyromegaly Respiratory: clear to auscultation bilaterally, no wheezing, no crackles. Normal respiratory effort. No accessory muscle use.  Cardiovascular: Regular rate and rhythm, no murmurs / rubs / gallops. No extremity  edema.  AV fistula graft present without palpable thrill. Abdomen: no tenderness, no masses palpated.  Bowel sounds positive.  Musculoskeletal: no clubbing / cyanosis. No joint deformity upper and lower extremities. Good ROM, no contractures. Normal muscle tone.  Skin: no rashes, lesions, ulcers. No induration Neurologic: CN 2-12 grossly intact.  Strength 5/5 in all 4.  Psychiatric: Normal judgment and insight. Alert and oriented x 3. Normal mood.   Data Reviewed:   EKG revealed normal sinus rhythm at 90 bpm.  Reviewed labs, imaging, and pertinent records as documented. Assessment and Plan:  Complication of left AV fistula Acute.  Patient presents with clotted dialysis graft.  Normally dialyzes on TTS with last dialysis session on 9/18.  Labs revealed potassium 4.9, CO2 20, BUN 85, creatinine 12.75, and anion gap 18.  IR was consulted for need of dialysis and plan on possibly declotting access in AM.  Of note patient reports prior history of issues with fistula clotting last year.  Supposed to be on Eliquis , but was unable to afford the medication and currently taking just aspirin  as no alternative was recommended. - Admit to a medical telemetry - N.p.o. after midnight - Check  renal function panel daily -Continue aspirin  - Appreciate IR consultative services, will follow-up for any further recommendations - Dr. Tobie of nephrology consulted for likely need of hemodialysis tomorrow after declotting.  Hypertensive urgency Blood pressures elevated up to 202/97 - Continue Coreg  - Hydralazine  IV as needed for elevated blood pressures  Controlled diabetes mellitus type 2, with long-term use of insulin  On admission glucose noted to be 182.  Last available hemoglobin A1c noted to be 6.9 when checked on 01/28/2024. - Hypoglycemic protocols - Continue pharmacy substitution for long-acting insulin  - CBG's before every meal with sensitive SSI  Gout - Continue allopurinol   Hyperlipidemia - Continue Crestor   Obesity, class II  BMI 37.5 kg/m  DVT prophylaxis: Heparin  Advance Care Planning:   Code Status: Full Code   Consults: IR and nephrology  Family Communication: daughter updated at bedside  Severity of Illness: The appropriate patient status for this patient is OBSERVATION. Observation status is judged to be reasonable and necessary in order to provide the required intensity of service to ensure the patient's safety. The patient's presenting symptoms, physical exam findings, and initial radiographic and laboratory data in the context of their medical condition is felt to place them at decreased risk for further clinical deterioration. Furthermore, it is anticipated that the patient will be medically stable for discharge from the hospital within 2 midnights of admission.   Author: Maximino DELENA Sharps, MD 09/15/2024 12:44 PM  For on call review [email or web address].

## 2024-09-15 NOTE — ED Provider Notes (Signed)
 [place] EMERGENCY DEPARTMENT AT [facility] Provider Note   CSN: [id number] Arrival date & time: 09/15/24  1027     Patient presents with: Vascular Access Problem   Katie Garcia is a 83 y.o. female.   HPI Patient presents with clotted dialysis graft.  Last dialysis on Thursday with today being Tuesday.  Missed Saturday.  No chest pain.  No shortness of breath.     Past Medical History:  Diagnosis Date   Anemia    Arthritis    COVID    hospitalized   Diabetes mellitus without complication (HCC)    ESRD    Dialysis T/Th/Sa at [address]   GERD (gastroesophageal reflux disease)    Gout    Headache    Hypertension    Type 2 diabetes mellitus (HCC)     Prior to Admission medications   Medication Sig Start Date End Date Taking? Authorizing Provider  Accu-Chek FastClix Lancets MISC  03/31/18   [provider]  ACCU-CHEK GUIDE test strip 1 each by Other route in the morning and at bedtime. E11.22 10/06/18   [provider]  acetaminophen  (TYLENOL ) 500 MG tablet Take 500-1,000 mg by mouth every 6 (six) hours as needed for moderate pain or headache.    [provider]  allopurinol  (ZYLOPRIM ) 100 MG tablet Take 100 mg by mouth every Tuesday, Thursday, and Saturday at 6 PM. In the morning after dialysis    [provider]  apixaban  (ELIQUIS ) 5 MG TABS tablet Take 1 tablet (5 mg total) by mouth 2 (two) times daily. 05/12/24   Magda Debby SAILOR, MD  aspirin  EC 81 MG tablet Take 1 tablet (81 mg total) by mouth daily. Swallow whole. 05/12/24 05/12/25  Magda Debby SAILOR, MD  B Complex-C-Folic Acid  (DIALYVITE 800) 0.8 MG TABS Take 1 tablet by mouth every evening. 02/21/24   [provider]  Blood Glucose Monitoring Suppl (ACCU-CHEK GUIDE) w/Device KIT     [provider]  calcium  acetate (PHOSLO ) 667 MG capsule Take 1,334 mg by mouth 3 (three) times daily with meals. 10/24/18   [provider]  carvedilol  (COREG ) 12.5  MG tablet Take 6.25 mg by mouth in the morning and at bedtime.    [provider]  Continuous Glucose Receiver (DEXCOM G7 RECEIVER) DEVI Use to monitor blood sugar. 05/15/23   Von Pacific, MD  Continuous Glucose Sensor (DEXCOM G7 SENSOR) MISC CHANGE EVERY 10 DAYS 09/10/24   Dartha Ernst, MD  guaifenesin  (ROBITUSSIN) 100 MG/5ML syrup Take 100 mg by mouth 3 (three) times daily as needed for cough.    [provider]  insulin  glargine (LANTUS  SOLOSTAR) 100 UNIT/ML Solostar Pen INJECT 30 UNITS INTO THE SKIN DAILY. ADJUST AS DIRECTED Patient taking differently: Inject 20 Units into the skin at bedtime. Adjust as directed 06/04/23   Von Pacific, MD  Insulin  Pen Needle 31G X 5 MM MISC Use for insulin  10/20/22   Von Pacific, MD  insulin  regular (NOVOLIN R) 100 units/mL injection Inject 20 Units into the skin daily before breakfast. 02/20/23   [provider]  loratadine  (CLARITIN ) 10 MG tablet Take 10 mg by mouth daily. 11/12/20   [provider]  RELION INSULIN  SYR 0.5ML/31G 31G X 5/16 0.5 ML MISC USE 4 times DAILY 10/03/22   Von Pacific, MD  rosuvastatin  (CRESTOR ) 10 MG tablet Take 10 mg by mouth in the morning.    [provider]  SYMBICORT 160-4.5 MCG/ACT inhaler Inhale 2 puffs into the  lungs 2 (two) times daily.    [provider]    Allergies: Penicillins    Review of Systems  Updated Vital Signs BP (!) 202/97 (BP Location: Left Wrist)   Pulse 86   Temp 98.2 F (36.8 C)   Resp 16   Ht 5' (1.524 m)   Wt 87.1 kg   SpO2 100%   BMI 37.50 kg/m   Physical Exam Vitals and nursing note reviewed.  Cardiovascular:     Rate and Rhythm: Regular rhythm.  Abdominal:     Tenderness: There is no abdominal tenderness.  Musculoskeletal:     Cervical back: Neck supple.     Comments:  told right upper extremity dialysis graft without pulse or thrill.  Skin:    Capillary Refill: Capillary refill takes less than 2 seconds.  Neurological:      Mental Status: She is alert and oriented to person, place, and time.     (all labs ordered are listed, but only abnormal results are displayed) Labs Reviewed  BASIC METABOLIC PANEL WITH GFR - Abnormal; Notable for the following components:      Result Value   CO2 20 (*)    Glucose, Bld 182 (*)    BUN 85 (*)    Creatinine, Ser 12.76 (*)    GFR, Estimated 3 (*)    Anion gap 18 (*)    All other components within normal limits  CBC - Abnormal; Notable for the following components:   RBC 3.77 (*)    Hemoglobin 10.9 (*)    HCT 34.1 (*)    RDW 17.1 (*)    All other components within normal limits  I-STAT CHEM 8, ED - Abnormal; Notable for the following components:   BUN 74 (*)    Creatinine, Ser 14.00 (*)    Glucose, Bld 182 (*)    Calcium , Ion 1.01 (*)    TCO2 20 (*)    Hemoglobin 11.2 (*)    HCT 33.0 (*)    All other components within normal limits    EKG: None  Radiology: No results found.   Procedures   Medications Ordered in the ED - No data to display                                  Medical Decision Making Amount and/or Complexity of Data Reviewed Labs: ordered.   Patient clogged dialysis graft.  Discussed with Dr. Silver from vascular surgery.  Recommended IR for declot.  Discussed with Dr. Luverne from IR.  Cannot get done today.  Recommends admission to hospital.  Will discuss with hospitalist.       Final diagnoses:  ESRD (end stage renal disease) on dialysis [facility])  Clotted renal dialysis arteriovenous graft, initial encounter [facility])    ED Discharge Orders     None          Patsey Lot, MD 09/15/24 1239

## 2024-09-15 NOTE — Plan of Care (Signed)

## 2024-09-16 DIAGNOSIS — Z794 Long term (current) use of insulin: Secondary | ICD-10-CM | POA: Diagnosis not present

## 2024-09-16 DIAGNOSIS — E66812 Obesity, class 2: Secondary | ICD-10-CM | POA: Diagnosis not present

## 2024-09-16 DIAGNOSIS — T82898A Other specified complication of vascular prosthetic devices, implants and grafts, initial encounter: Secondary | ICD-10-CM | POA: Diagnosis not present

## 2024-09-16 DIAGNOSIS — E118 Type 2 diabetes mellitus with unspecified complications: Secondary | ICD-10-CM | POA: Diagnosis not present

## 2024-09-16 DIAGNOSIS — I160 Hypertensive urgency: Secondary | ICD-10-CM | POA: Diagnosis not present

## 2024-09-16 LAB — CBC
HCT: 30.3 % — ABNORMAL LOW (ref 36.0–46.0)
Hemoglobin: 9.9 g/dL — ABNORMAL LOW (ref 12.0–15.0)
MCH: 28.5 pg (ref 26.0–34.0)
MCHC: 32.7 g/dL (ref 30.0–36.0)
MCV: 87.3 fL (ref 80.0–100.0)
Platelets: 223 K/uL (ref 150–400)
RBC: 3.47 MIL/uL — ABNORMAL LOW (ref 3.87–5.11)
RDW: 16.7 % — ABNORMAL HIGH (ref 11.5–15.5)
WBC: 4.6 K/uL (ref 4.0–10.5)
nRBC: 0 % (ref 0.0–0.2)

## 2024-09-16 LAB — GLUCOSE, CAPILLARY
Glucose-Capillary: 98 mg/dL (ref 70–99)
Glucose-Capillary: 116 mg/dL — ABNORMAL HIGH (ref 70–99)
Glucose-Capillary: 166 mg/dL — ABNORMAL HIGH (ref 70–99)
Glucose-Capillary: 227 mg/dL — ABNORMAL HIGH (ref 70–99)

## 2024-09-16 LAB — RENAL FUNCTION PANEL
Albumin: 2.8 g/dL — ABNORMAL LOW (ref 3.5–5.0)
Anion gap: 20 — ABNORMAL HIGH (ref 5–15)
BUN: 101 mg/dL — ABNORMAL HIGH (ref 8–23)
CO2: 17 mmol/L — ABNORMAL LOW (ref 22–32)
Calcium: 8.3 mg/dL — ABNORMAL LOW (ref 8.9–10.3)
Chloride: 99 mmol/L (ref 98–111)
Creatinine, Ser: 13.79 mg/dL — ABNORMAL HIGH (ref 0.44–1.00)
GFR, Estimated: 2 mL/min — ABNORMAL LOW (ref >60)
Glucose, Bld: 115 mg/dL — ABNORMAL HIGH (ref 70–99)
Phosphorus: 6.1 mg/dL — ABNORMAL HIGH (ref 2.5–4.6)
Potassium: 4.9 mmol/L (ref 3.5–5.1)
Sodium: 136 mmol/L (ref 135–145)

## 2024-09-16 LAB — HEPATITIS B SURFACE ANTIBODY, QUANTITATIVE: Hep B S AB Quant (Post): 441 m[IU]/mL

## 2024-09-16 MED ORDER — CEFAZOLIN SODIUM-DEXTROSE 2-4 GM/100ML-% IV SOLN: 2.0000 g | Freq: Once | INTRAVENOUS | Status: DC

## 2024-09-16 MED ORDER — CEFAZOLIN SODIUM-DEXTROSE 2-4 GM/100ML-% IV SOLN
2.0000 g | Freq: Once | INTRAVENOUS | Status: AC
  Administered 2024-09-17: 2 g via INTRAVENOUS

## 2024-09-16 NOTE — TOC CM/SW Note (Signed)
 Transition of Care [facility]) - Inpatient Brief Assessment   Patient Details  Name: Katie Garcia MRN: [id number] Date of Birth: Jan 07, 1941  Transition [facility]) CM/SW Contact:    Tom-Johnson, Jakiyah Stepney Daphne, RN Phone Number: 09/16/2024, 2:37 PM   Clinical Narrative:  Patient presented to the ED with Clogged HD Fistula, patient currently has two HD Catheter one in each Arm. Patient has hx of ESRD on TTS outpatient HD schedule, T2DM, HTN, Gout.  IR consulted for declotting and Tunneled Dialysis Catheter placement.  CM spoke with patient and daughter, [name] at bedside about needs for post hospital transition. Patient lives with her two daughters out of four children. On Disability, does not drive, [name] transports to and from appointments and outpatient Hemodialysis. Does not have DME's at home.  PCP is Elliot Charm, MD and uses [facility] on [address]  No ICM needs or recommendations noted at this time.  Patient not Medically ready for discharge.  CM will continue to follow as patient progresses with care towards discharge.             Transition of Care Asessment: Insurance and Status: Insurance coverage has been reviewed Patient has primary care physician: Yes Home environment has been reviewed: Yes Prior level of function:: Independent Prior/Current Home Services: No current home services Social Drivers of Health Review: SDOH reviewed no interventions necessary Readmission risk has been reviewed: Yes Transition of care needs: no transition of care needs at this time

## 2024-09-16 NOTE — Progress Notes (Signed)
 PROGRESS NOTE  Katie Garcia FMW:[id number] DOB: 12/24/1941   PCP: Elliot Charm, MD  Patient is from: Home.  Independently ambulates at baseline.  DOA: 09/15/2024 LOS: 0  Chief complaints Chief Complaint  Patient presents with   Vascular Access Problem     Brief Narrative / Interim history: 83 year old F with PMH of ESRD on HD TTS, DM-2 and HTN presenting with AVF malfunction.  Missed HD on Saturday due to this.  Patient reports prior history of AVF malfunction.  She says she was supposed to be on Eliquis  but she could not afford.  She takes baby aspirin .  In ED, BP elevated to 202/97.  Other vital stable.  Labs consistent with ESRD without indication for emergent HD.  Vascular surgery consulted and recommended IR consult to declot AVF.  Evaluated by IR and they recommended [facility] and outpatient follow-up with vascular for AVF malfunction.  IR nephrology on board.  Subjective: Seen and examined earlier this morning.  No major events overnight or this morning.  No complaints.  She is up walking in the room  Objective: Vitals:   09/16/24 0553 09/16/24 0737 09/16/24 0831 09/16/24 1255  BP: (!) 179/66 (!) 148/61 (!) 179/83 (!) 139/52  Pulse: 81 87 83 89  Resp:   19 18  Temp: 98.2 F (36.8 C) 98.2 F (36.8 C) 98.6 F (37 C) 98.2 F (36.8 C)  TempSrc: Oral Oral  Oral  SpO2: 100% 98% 100% 100%  Weight:      Height:        Examination:  GENERAL: No apparent distress.  Nontoxic. HEENT: MMM.  Vision and hearing grossly intact.  NECK: Supple.  No apparent JVD.  RESP:  No IWOB.  Fair aeration bilaterally. CVS:  RRR. Heart sounds normal.  ABD/GI/GU: BS+. Abd soft, NTND.  MSK/EXT:  Moves extremities. No apparent deformity. No edema.  SKIN: no apparent skin lesion or wound NEURO: AA.  Oriented appropriately.  No apparent focal neuro deficit. PSYCH: Calm. Normal affect.   Consultants:  Vascular surgery Interventional  radiology Nephrology  Procedures: None  Microbiology summarized: MRSA PCR screen nonreactive  Assessment and plan: ESRD on HD TTS/left AVF malfunction: Missed HD on Saturday due to this. -[facility] recommended IR consult -IR recommended [facility] and outpatient follow-up with [facility] for left AVF malfunction -Nephrology to dialyze after [facility]  Hypertensive urgency: BP was elevated to 202/97.  Improved. - Continue Coreg  - Hydralazine  IV as needed for elevated blood pressures   IDDM-2 with hyperglycemia: A1c 6.9% in 01/2024.  Uses Lantus  20 units at night. Recent Labs  Lab 09/15/24 1557 09/15/24 2112 09/16/24 0713 09/16/24 1102  GLUCAP 140* 200* 98 116*  - Continue Lantus  20 units at night   Gout -Continue allopurinol  TTS.   Hyperlipidemia - Continue Crestor    Morbid obesity: Elevated BMI with diabetes and ESRD. Body mass index is 37.5 kg/m.          DVT prophylaxis:  heparin  injection 5,000 Units Start: 09/15/24 1400  Code Status: Full code Family Communication: None at bedside Level of care: Telemetry Medical Status is: Observation The patient will require care spanning > 2 midnights and should be moved to inpatient because: Due to AVF malfunction   Final disposition: Home   35 minutes with more than 50% spent in reviewing records, counseling patient/family and coordinating care.   Sch Meds:  Scheduled Meds:  allopurinol   100 mg Oral Q T,Th,Sat-1800   aspirin  EC  81 mg Oral Daily   calcitRIOL   0.75  mcg Oral Q T,Th,Sa-HD   calcium  acetate  1,334 mg Oral TID WC   carvedilol   6.25 mg Oral BID WC   Chlorhexidine  Gluconate Cloth  6 each Topical Q0600   heparin   5,000 Units Subcutaneous Q8H   insulin  glargine  20 Units Subcutaneous QHS   multivitamin  1 tablet Oral QPM   rosuvastatin   10 mg Oral q AM   Continuous Infusions:   ceFAZolin  (ANCEF ) IV     PRN Meds:.albuterol , hydrALAZINE , loratadine , ondansetron  **OR** ondansetron  (ZOFRAN )  IV  Antimicrobials: Anti-infectives (From admission, onward)    Start     Dose/Rate Route Frequency Ordered Stop   09/16/24 1400  ceFAZolin  (ANCEF ) IVPB 2g/100 mL premix        2 g 200 mL/hr over 30 Minutes Intravenous  Once 09/16/24 1151     09/16/24 1230  ceFAZolin  (ANCEF ) IVPB 2g/100 mL premix  Status:  Discontinued        2 g 200 mL/hr over 30 Minutes Intravenous  Once 09/16/24 1133 09/16/24 1151        I have personally reviewed the following labs and images: CBC: Recent Labs  Lab 09/15/24 1037 09/15/24 1054 09/16/24 0428  WBC 5.2  --  4.6  HGB 10.9* 11.2* 9.9*  HCT 34.1* 33.0* 30.3*  MCV 90.5  --  87.3  PLT 226  --  223   BMP &GFR Recent Labs  Lab 09/15/24 1037 09/15/24 1054 09/16/24 0428  NA 139 139 136  K 4.9 4.8 4.9  CL 101 108 99  CO2 20*  --  17*  GLUCOSE 182* 182* 115*  BUN 85* 74* 101*  CREATININE 12.76* 14.00* 13.79*  CALCIUM  9.1  --  8.3*  PHOS  --   --  6.1*   Estimated Creatinine Clearance: 3.1 mL/min (A) (by C-G formula based on SCr of 13.79 mg/dL (H)). Liver & Pancreas: Recent Labs  Lab 09/16/24 0428  ALBUMIN  2.8*   No results for input(s): LIPASE, AMYLASE in the last 168 hours. No results for input(s): AMMONIA in the last 168 hours. Diabetic: No results for input(s): HGBA1C in the last 72 hours. Recent Labs  Lab 09/15/24 1557 09/15/24 2112 09/16/24 0713 09/16/24 1102  GLUCAP 140* 200* 98 116*   Cardiac Enzymes: No results for input(s): CKTOTAL, CKMB, CKMBINDEX, TROPONINI in the last 168 hours. No results for input(s): PROBNP in the last 8760 hours. Coagulation Profile: No results for input(s): INR, PROTIME in the last 168 hours. Thyroid Function Tests: No results for input(s): TSH, T4TOTAL, FREET4, T3FREE, THYROIDAB in the last 72 hours. Lipid Profile: No results for input(s): CHOL, HDL, LDLCALC, TRIG, CHOLHDL, LDLDIRECT in the last 72 hours. Anemia Panel: No results for  input(s): VITAMINB12, FOLATE, FERRITIN, TIBC, IRON, RETICCTPCT in the last 72 hours. Urine analysis:    Component Value Date/Time   COLORURINE YELLOW 06/26/2016 1135   APPEARANCEUR CLOUDY (A) 06/26/2016 1135   LABSPEC 1.018 06/26/2016 1135   PHURINE 5.5 06/26/2016 1135   GLUCOSEU 250 (A) 06/26/2016 1135   HGBUR SMALL (A) 06/26/2016 1135   BILIRUBINUR NEGATIVE 06/26/2016 1135   KETONESUR NEGATIVE 06/26/2016 1135   PROTEINUR >300 (A) 06/26/2016 1135   UROBILINOGEN 0.2 03/05/2014 0013   NITRITE NEGATIVE 06/26/2016 1135   LEUKOCYTESUR SMALL (A) 06/26/2016 1135   Sepsis Labs: Invalid input(s): PROCALCITONIN, LACTICIDVEN  Microbiology: Recent Results (from the past 240 hours)  MRSA Next Gen by PCR, Nasal     Status: None   Collection Time: 09/15/24  4:09 PM  Specimen: Nasal Mucosa; Nasal Swab  Result Value Ref Range Status   MRSA by PCR Next Gen NOT DETECTED NOT DETECTED Final    Comment: (NOTE) The GeneXpert MRSA Assay (FDA approved for NASAL specimens only), is one component of a comprehensive MRSA colonization surveillance program. It is not intended to diagnose MRSA infection nor to guide or monitor treatment for MRSA infections. Test performance is not FDA approved in patients less than 56 years old. Performed at [facility], [address], [place], [place] [address]     Radiology Studies: No results found.    Avanelle Pixley T. Christianne Zacher [facility]  If 7PM-7AM, please contact night-coverage [email or web address] 09/16/2024, 1:11 PM

## 2024-09-16 NOTE — Progress Notes (Signed)
  [facility] Progress Note   Subjective: Seen in room. No new events overnight. No chest pain/sob. IR consulted for graft declot. Dialysis after declot.   Objective Vitals:   09/15/24 2100 09/16/24 0553 09/16/24 0737 09/16/24 0831  BP: (!) 145/55 (!) 179/66 (!) 148/61 (!) 179/83  Pulse: 79 81 87 83  Resp:    19  Temp: 98 F (36.7 C) 98.2 F (36.8 C) 98.2 F (36.8 C) 98.6 F (37 C)  TempSrc: Oral Oral Oral   SpO2: 100% 100% 98% 100%  Weight:      Height:        Additional Objective Labs: Basic Metabolic Panel: Recent Labs  Lab 09/15/24 1037 09/15/24 1054 09/16/24 0428  NA 139 139 136  K 4.9 4.8 4.9  CL 101 108 99  CO2 20*  --  17*  GLUCOSE 182* 182* 115*  BUN 85* 74* 101*  CREATININE 12.76* 14.00* 13.79*  CALCIUM  9.1  --  8.3*  PHOS  --   --  6.1*   CBC: Recent Labs  Lab 09/15/24 1037 09/15/24 1054 09/16/24 0428  WBC 5.2  --  4.6  HGB 10.9* 11.2* 9.9*  HCT 34.1* 33.0* 30.3*  MCV 90.5  --  87.3  PLT 226  --  223   Blood Culture No results found for: SDES, SPECREQUEST, CULT, REPTSTATUS  Physical Exam General: Well appearing, nad Heart: RRR Lungs: Clear bilaterally  Abdomen: non -tender Extremities: no LE edema  Dialysis Access: RUE AVG w/o bruit   Medications:   allopurinol   100 mg Oral Q T,Th,Sat-1800   aspirin  EC  81 mg Oral Daily   calcitRIOL   0.75 mcg Oral Q T,Th,Sa-HD   calcium  acetate  1,334 mg Oral TID WC   carvedilol   6.25 mg Oral BID WC   Chlorhexidine  Gluconate Cloth  6 each Topical Q0600   heparin   5,000 Units Subcutaneous Q8H   insulin  glargine  20 Units Subcutaneous QHS   multivitamin  1 tablet Oral QPM   rosuvastatin   10 mg Oral q AM    Dialysis Orders:  GOC TTS 4:00 BFR 400 EDW 88.6 kg  3K/2.5Ca  AVG Hep 8400   Assessment/Plan: 1.  Thrombosed right upper arm loop graft: Awaiting evaluation by interventional radiology with potential schedule for thrombectomy tomorrow and placement of dialysis catheter  if unsuccessful in establishing patency. 2.  End-stage renal disease: Resume her outpatient TTS schedule after graft declot  3.  Hypertension: Marginally elevated blood pressure, resume antihypertensive therapy and monitor with ultrafiltration and hemodialysis. 4.  Anemia: Hemoglobin and hematocrit currently at goal, no indication for ESA therapy at this time. 5.  Secondary hyperparathyroidism: Will follow calcium /phosphorus levels while in the hospital and resume binders along with calcitriol .  Maisie Ronnald Acosta PA-C [facility] 09/16/2024,9:22 AM

## 2024-09-16 NOTE — Care Management Obs Status (Signed)
 MEDICARE OBSERVATION STATUS NOTIFICATION   Patient Details  Name: Katie Garcia MRN: [id number] Date of Birth: [birth year]   Medicare Observation Status Notification Given:     Obs notice signed and copy provided  Claretta Deed 09/16/2024, 12:25 PM

## 2024-09-16 NOTE — Progress Notes (Signed)
 Pt receives out-pt HD at [facility] on TTS 5:50 am chair time. Will assist as needed.   Randine Mungo Dialysis Navigator [phone]

## 2024-09-16 NOTE — Care Management Obs Status (Signed)
 MEDICARE OBSERVATION STATUS NOTIFICATION   Patient Details  Name: Katie Garcia MRN: [id number] Date of Birth: September 16, 1941   Medicare Observation Status Notification Given:  Yes Obs notice signed and copy provided    Claretta Deed 09/16/2024, 2:22 PM

## 2024-09-16 NOTE — Consult Note (Signed)
 Chief Complaint: Malfunction graft. Request is for tunneled dialysis catheter placement  Referring Physician(s): Dr. Rankin River / Dr. Gordy Blanch  Supervising Physician: Jennefer Rover  Patient Status: [facility] - In-pt  History of Present Illness: Katie Garcia is a 83 y.o. female  inpatient. History DM, HTN, ESRD on HD via presented to the ED at [facility] on 9.22.25 with malfunctioning  AVG.   Right upper arm axillary to  axillary AV loop graft. And a prior left arm AV gore tex graft placed on 6.30.23 Patient has complex vascular history including  1) ultrasound guided right arm arteriovenous graft access x 2 2) right arm fistulagram 3) right arm arteriovenous graft mechanical thrombectomy 4) right axillary vein angioplasty (7 x 80 mm Mustang) 5) right brachiocephalic angioplasty (14 x 40 mm Athletis) 6) superior vena cava angioplasty (14 x 40 mm Athletis)  Per note from Dr. Magda dated 5.19.25 Should graft thrombosed again, I would recommend creation of new access. Request discussed with Vascular Attending Dr. Cheryle Rim who recommends tunneled hemodialysis access placement and follow up as outpatient. Team is requesting tunneled hemodialysis catheter placement  Daughter at bedside. Currently without any significant complaints. Patient alert and laying in bed,calm. Denies any fevers, headache, chest pain, SOB, cough, abdominal pain, nausea, vomiting or bleeding.   BUN 101, Cr 13.79, GFR < 2. Patient is on 81 mg of ASA, last dose given on 9.22.25@ 15:27 and Patient is on subcutaneous prophylactic dose of heparin . Allergies include PCN. Patient has been NPO since midnight.   Return precautions and treatment recommendations and follow-up discussed with the patient and her daughter. Both who are agreeable with the plan.     Past Medical History:  Diagnosis Date   Anemia    Arthritis    COVID    hospitalized   Diabetes mellitus without complication (HCC)    ESRD    Dialysis  T/Th/Sa at [address]   GERD (gastroesophageal reflux disease)    Gout    Headache    Hypertension    Type 2 diabetes mellitus [facility])     Past Surgical History:  Procedure Laterality Date   A/V FISTULAGRAM Left 04/05/2022   Procedure: A/V Fistulagram;  Surgeon: Rim Fonda BRAVO, MD;  Location: [facility] INVASIVE CV LAB;  Service: Cardiovascular;  Laterality: Left;   A/V FISTULAGRAM Right 04/14/2024   Procedure: A/V Fistulagram;  Surgeon: Sheree Penne Bruckner, MD;  Location: [facility] INVASIVE CV LAB;  Service: Cardiovascular;  Laterality: Right;   A/V FISTULAGRAM  05/12/2024   Procedure: A/V Fistulagram;  Surgeon: Magda Debby SAILOR, MD;  Location: [facility] PV LAB;  Service: Cardiovascular;;   A/V SHUNT INTERVENTION N/A 05/12/2024   Procedure: A/V SHUNT INTERVENTION;  Surgeon: Magda Debby SAILOR, MD;  Location: [facility] PV LAB;  Service: Cardiovascular;  Laterality: N/A;   ABDOMINAL HYSTERECTOMY     AV FISTULA PLACEMENT Left 10/02/2016   Procedure: ARTERIOVENOUS (AV) FISTULA CREATION LEFT UPPER ARM;  Surgeon: Penne Bruckner Sheree, MD;  Location: [facility] OR;  Service: Vascular;  Laterality: Left;   AV FISTULA PLACEMENT Right 07/14/2020   Procedure: RIGHT ARM ARTERIOVENOUS FISTULA CREATION;  Surgeon: Sheree Penne Bruckner, MD;  Location: [facility] OR;  Service: Vascular;  Laterality: Right;   AV FISTULA PLACEMENT Left 05/16/2021   Procedure: INSERTION OF LEFT UPPER ARM ARTERIOVENOUS (AV) GORE-TEX GRAFT;  Surgeon: Gretta Bruckner PARAS, MD;  Location: [facility] OR;  Service: Vascular;  Laterality: Left;   AV FISTULA PLACEMENT Right 07/13/2021   Procedure: RIGHT ARM ARTERIOVENOUS (AV) FISTULA  GRAFT INSERTION;  Surgeon: Sheree Penne Bruckner, MD;  Location: [facility] OR;  Service: Vascular;  Laterality: Right;   AV FISTULA PLACEMENT Left 02/03/2022   Procedure: LEFT UPPER ARM ARTERIOVENOUS FISTULA CREATION;  Surgeon: Sheree Penne Bruckner, MD;  Location: [facility] OR;  Service: Vascular;  Laterality: Left;   AV FISTULA PLACEMENT Left  06/23/2022   Procedure: INSERTION OF LEFT ARM ARTERIOVENOUS (AV) GORE-TEX GRAFT;  Surgeon: Sheree Penne Bruckner, MD;  Location: [facility] OR;  Service: Vascular;  Laterality: Left;   AV FISTULA PLACEMENT Right 03/07/2024   Procedure: INSERTION OF ARTERIOVENOUS (AV) GORE-TEX GRAFT ARM;  Surgeon: Sheree Penne Bruckner, MD;  Location: [facility] OR;  Service: Vascular;  Laterality: Right;   BASCILIC VEIN TRANSPOSITION Left 05/17/2020   Procedure: Insertion of Left arm arteriovenous gortex graftarm ;  Surgeon: Eliza Bruckner RAMAN, MD;  Location: [facility] OR;  Service: Vascular;  Laterality: Left;   CESAREAN SECTION     DIALYSIS/PERMA CATHETER INSERTION     DIALYSIS/PERMA CATHETER INSERTION  12/10/2023   Procedure: DIALYSIS/PERMA CATHETER INSERTION;  Surgeon: Pearline Norman RAMAN, MD;  Location: [facility] INVASIVE CV LAB;  Service: Cardiovascular;;   DIALYSIS/PERMA CATHETER INSERTION N/A 01/18/2024   Procedure: DIALYSIS/PERMA CATHETER INSERTION;  Surgeon: Norine Manuelita LABOR, MD;  Location: [facility] INVASIVE CV LAB;  Service: Cardiovascular;  Laterality: N/A;   IR FLUORO GUIDE CV LINE LEFT  02/09/2022   IR PTA VENOUS EXCEPT DIALYSIS CIRCUIT  02/09/2022   IR VENOCAVAGRAM SVC  02/09/2022   PERIPHERAL VASCULAR THROMBECTOMY N/A 12/10/2023   Procedure: PERIPHERAL VASCULAR THROMBECTOMY;  Surgeon: Pearline Norman RAMAN, MD;  Location: [facility] INVASIVE CV LAB;  Service: Cardiovascular;  Laterality: N/A;   PERIPHERAL VASCULAR THROMBECTOMY  05/12/2024   Procedure: PERIPHERAL VASCULAR THROMBECTOMY;  Surgeon: Magda Debby SAILOR, MD;  Location: [facility] PV LAB;  Service: Cardiovascular;;   REVISON OF ARTERIOVENOUS FISTULA Right 09/15/2020   Procedure: REVISON OF RIGHT UPPER ARM  ARTERIOVENOUS FISTULA;  Surgeon: Sheree Penne Bruckner, MD;  Location: [facility] OR;  Service: Vascular;  Laterality: Right;   UPPER EXTREMITY VENOGRAPHY Bilateral 04/18/2021   Procedure: UPPER EXTREMITY VENOGRAPHY;  Surgeon: Sheree Penne Bruckner, MD;  Location: [facility] INVASIVE CV LAB;   Service: Cardiovascular;  Laterality: Bilateral;   UPPER EXTREMITY VENOGRAPHY Left 01/23/2022   Procedure: UPPER EXTREMITY VENOGRAPHY;  Surgeon: Sheree Penne Bruckner, MD;  Location: [facility] INVASIVE CV LAB;  Service: Cardiovascular;  Laterality: Left;   UPPER EXTREMITY VENOGRAPHY Bilateral 02/25/2024   Procedure: UPPER EXTREMITY VENOGRAPHY;  Surgeon: Sheree Penne Bruckner, MD;  Location: [facility] INVASIVE CV LAB;  Service: Cardiovascular;  Laterality: Bilateral;    Allergies: Penicillins  Medications: Prior to Admission medications   Medication Sig Start Date End Date Taking? Authorizing Provider  acetaminophen  (TYLENOL ) 500 MG tablet Take 500-1,000 mg by mouth every 6 (six) hours as needed for moderate pain or headache.   Yes [provider]  allopurinol  (ZYLOPRIM ) 100 MG tablet Take 100 mg by mouth every Tuesday, Thursday, and Saturday at 6 PM. In the morning after dialysis   Yes [provider]  aspirin  EC 81 MG tablet Take 1 tablet (81 mg total) by mouth daily. Swallow whole. 05/12/24 05/12/25 Yes Magda Debby SAILOR, MD  B Complex-C-Folic Acid  (DIALYVITE 800) 0.8 MG TABS Take 1 tablet by mouth every evening. 02/21/24  Yes [provider]  calcium  acetate (PHOSLO ) 667 MG capsule Take 1,334 mg by mouth 3 (three) times daily with meals. 10/24/18  Yes [provider]  carvedilol  (COREG ) 12.5 MG tablet Take 6.25 mg by mouth in  the morning and at bedtime.   Yes [provider]  Dextromethorphan-guaiFENesin  10-200 MG/5ML LIQD Take 3 mLs by mouth daily as needed (Cough).   Yes [provider]  guaifenesin  (ROBITUSSIN) 100 MG/5ML syrup Take 100 mg by mouth 3 (three) times daily as needed for cough.   Yes [provider]  insulin  glargine (LANTUS  SOLOSTAR) 100 UNIT/ML Solostar Pen INJECT 30 UNITS INTO THE SKIN DAILY. ADJUST AS DIRECTED Patient taking differently: Inject 20 Units into the skin at bedtime. Adjust as directed 06/04/23  Yes Von Pacific,  MD  insulin  regular (NOVOLIN R) 100 units/mL injection Inject 20 Units into the skin daily before breakfast. 02/20/23  Yes [provider]  loratadine  (CLARITIN ) 10 MG tablet Take 10 mg by mouth daily. 11/12/20  Yes [provider]  rosuvastatin  (CRESTOR ) 10 MG tablet Take 10 mg by mouth in the morning.   Yes [provider]  SYMBICORT 160-4.5 MCG/ACT inhaler Inhale 2 puffs into the lungs 2 (two) times daily as needed (Asthma).   Yes [provider]  Continuous Glucose Sensor (DEXCOM G7 SENSOR) MISC CHANGE EVERY 10 DAYS 09/10/24   Dartha Ernst, MD     Family History  Problem Relation Age of Onset   Diabetes Mother     Social History   Socioeconomic History   Marital status: Married    Spouse name: Not on file   Number of children: Not on file   Years of education: Not on file   Highest education level: Not on file  Occupational History   Not on file  Tobacco Use   Smoking status: Never   Smokeless tobacco: Never  Vaping Use   Vaping status: Never Used  Substance and Sexual Activity   Alcohol use: Not Currently   Drug use: No   Sexual activity: Not on file  Other Topics Concern   Not on file  Social History Narrative   Not on file   Social Drivers of Health   Financial Resource Strain: Not on file  Food Insecurity: No Food Insecurity (09/15/2024)   Hunger Vital Sign    Worried About Running Out of Food in the Last Year: Never true    Ran Out of Food in the Last Year: Never true  Transportation Needs: No Transportation Needs (09/15/2024)   PRAPARE - Administrator, Civil Service (Medical): No    Lack of Transportation (Non-Medical): No  Physical Activity: Not on file  Stress: Not on file  Social Connections: Patient Declined (09/15/2024)   Social Connection and Isolation Panel    Frequency of Communication with Friends and Family: Patient declined    Frequency of Social Gatherings with Friends and Family: Patient declined     Attends Religious Services: Patient declined    Database administrator or Organizations: Patient declined    Attends Banker Meetings: Patient declined    Marital Status: Patient declined      Review of Systems: A 12 point ROS discussed and pertinent positives are indicated in the HPI above.  All other systems are negative.  Review of Systems  Constitutional:  Negative for fatigue and fever.  HENT:  Negative for congestion.   Respiratory:  Negative for cough and shortness of breath.   Gastrointestinal:  Negative for abdominal pain, diarrhea, nausea and vomiting.    Vital Signs: BP (!) 179/83 (BP Location: Left Arm)   Pulse 83   Temp 98.6 F (37 C)   Resp 19   Ht  5' (1.524 m)   Wt 192 lb (87.1 kg)   SpO2 100%   BMI 37.50 kg/m   Advance Care Plan: The advanced care plan/surrogate decision maker was discussed at the time of visit and the patient did not wish to discuss or was not able to name a surrogate decision maker or provide an advance care plan.    Physical Exam Vitals and nursing note reviewed.  Constitutional:      Appearance: She is well-developed.  HENT:     Head: Normocephalic and atraumatic.     Mouth/Throat:     Mouth: Mucous membranes are dry.  Eyes:     Conjunctiva/sclera: Conjunctivae normal.  Cardiovascular:     Rate and Rhythm: Normal rate.     Comments: Swelling noted to upper right arm. AVG present. Pulmonary:     Effort: Pulmonary effort is normal.  Musculoskeletal:        General: Normal range of motion.     Cervical back: Normal range of motion.  Skin:    General: Skin is warm and dry.  Neurological:     General: No focal deficit present.     Mental Status: She is alert and oriented to person, place, and time. Mental status is at baseline.  Psychiatric:        Mood and Affect: Mood normal.        Behavior: Behavior normal.        Thought Content: Thought content normal.        Judgment: Judgment normal.      Imaging: No results found.  Labs:  CBC: Recent Labs    04/14/24 0937 09/15/24 1037 09/15/24 1054 09/16/24 0428  WBC  --  5.2  --  4.6  HGB 12.2 10.9* 11.2* 9.9*  HCT 36.0 34.1* 33.0* 30.3*  PLT  --  226  --  223    COAGS: No results for input(s): INR, APTT in the last 8760 hours.  BMP: Recent Labs    04/14/24 0937 09/15/24 1037 09/15/24 1054 09/16/24 0428  NA 139 139 139 136  K 3.5 4.9 4.8 4.9  CL 102 101 108 99  CO2  --  20*  --  17*  GLUCOSE 130* 182* 182* 115*  BUN 46* 85* 74* 101*  CALCIUM   --  9.1  --  8.3*  CREATININE 8.90* 12.76* 14.00* 13.79*  GFRNONAA  --  3*  --  2*    LIVER FUNCTION TESTS: Recent Labs    09/16/24 0428  ALBUMIN  2.8*     Assessment and Plan:  83 y.o. female inpatient. History DM, HTN, ESRD on HD via presented to the ED at [facility] on 9.22.25 with malfunctioning  AVG.   Right upper arm axillary to  axillary AV loop graft. And a prior left arm AV gore tex graft placed on 6.30.23 Patient has complex vascular history including  1) ultrasound guided right arm arteriovenous graft access x 2 2) right arm fistulagram 3) right arm arteriovenous graft mechanical thrombectomy 4) right axillary vein angioplasty (7 x 80 mm Mustang) 5) right brachiocephalic angioplasty (14 x 40 mm Athletis) 6) superior vena cava angioplasty (14 x 40 mm Athletis)  Per note from Dr. Magda dated 5.19.25 Should graft thrombosed again, I would recommend creation of new access. Request discussed with Vascular Attending Dr. Cheryle Rim who recommends tunneled hemodialysis access placement and follow up as outpatient.  PLAN: IR Image Guided Tunneled Dialysis Catheter  Risks and benefits discussed with the patient including, but  not limited to bleeding, infection, vascular injury, pneumothorax which may require chest tube placement, air embolism or even death  All of the patient's questions were answered, patient is agreeable to proceed. Consent signed and in  chart.   Team is requesting fistulogram with possible intervention. After direct discussion with Dr. Cheryle Rim in vascular surgery request has been changed to tunneled hemodialysis catheter placement. Recommend follow up with vascular surgery   Thank you for this interesting consult.  I greatly enjoyed meeting Nakshatra Klose Arvidson and look forward to participating in their care.  A copy of this report was sent to the requesting provider on this date.  Electronically Signed: Delon JAYSON Beagle, NP 09/16/2024, 10:15 AM   I spent a total of 40 Minutes    in face to face in clinical consultation, greater than 50% of which was counseling/coordinating care for tunneled HD catheter placement

## 2024-09-17 ENCOUNTER — Observation Stay (HOSPITAL_COMMUNITY)

## 2024-09-17 DIAGNOSIS — T829XXA Unspecified complication of cardiac and vascular prosthetic device, implant and graft, initial encounter: Secondary | ICD-10-CM | POA: Diagnosis not present

## 2024-09-17 DIAGNOSIS — N186 End stage renal disease: Secondary | ICD-10-CM | POA: Diagnosis not present

## 2024-09-17 DIAGNOSIS — T82898A Other specified complication of vascular prosthetic devices, implants and grafts, initial encounter: Secondary | ICD-10-CM | POA: Diagnosis not present

## 2024-09-17 HISTORY — PX: IR TUNNELED CENTRAL VENOUS CATH PLC W IMG: IMG1939

## 2024-09-17 LAB — RENAL FUNCTION PANEL
Albumin: 2.8 g/dL — ABNORMAL LOW (ref 3.5–5.0)
Anion gap: 17 — ABNORMAL HIGH (ref 5–15)
BUN: 114 mg/dL — ABNORMAL HIGH (ref 8–23)
CO2: 19 mmol/L — ABNORMAL LOW (ref 22–32)
Calcium: 8.4 mg/dL — ABNORMAL LOW (ref 8.9–10.3)
Chloride: 102 mmol/L (ref 98–111)
Creatinine, Ser: 15.93 mg/dL — ABNORMAL HIGH (ref 0.44–1.00)
GFR, Estimated: 2 mL/min — ABNORMAL LOW (ref >60)
Glucose, Bld: 111 mg/dL — ABNORMAL HIGH (ref 70–99)
Phosphorus: 6.2 mg/dL — ABNORMAL HIGH (ref 2.5–4.6)
Potassium: 4.9 mmol/L (ref 3.5–5.1)
Sodium: 138 mmol/L (ref 135–145)

## 2024-09-17 LAB — GLUCOSE, CAPILLARY
Glucose-Capillary: 93 mg/dL (ref 70–99)
Glucose-Capillary: 109 mg/dL — ABNORMAL HIGH (ref 70–99)
Glucose-Capillary: 218 mg/dL — ABNORMAL HIGH (ref 70–99)

## 2024-09-17 LAB — HEMOGLOBIN A1C
Hgb A1c MFr Bld: 7.5 % — ABNORMAL HIGH (ref 4.8–5.6)
Mean Plasma Glucose: 168.55 mg/dL

## 2024-09-17 MED ORDER — LIDOCAINE-PRILOCAINE 2.5-2.5 % EX CREA: 1.0000 | TOPICAL_CREAM | CUTANEOUS | Status: DC | PRN

## 2024-09-17 MED ORDER — LIDOCAINE-EPINEPHRINE 1 %-1:100000 IJ SOLN
20.0000 mL | Freq: Once | INTRAMUSCULAR | Status: AC
  Administered 2024-09-17: 15 mL via INTRADERMAL

## 2024-09-17 MED ORDER — MIDAZOLAM HCL 2 MG/2ML IJ SOLN
INTRAMUSCULAR | Status: AC
  Filled 2024-09-17: qty 2

## 2024-09-17 MED ORDER — ALTEPLASE 2 MG IJ SOLR: 2.0000 mg | Freq: Once | INTRAMUSCULAR | Status: DC | PRN

## 2024-09-17 MED ORDER — LIDOCAINE-EPINEPHRINE 1 %-1:100000 IJ SOLN
INTRAMUSCULAR | Status: AC
  Filled 2024-09-17: qty 1

## 2024-09-17 MED ORDER — FENTANYL CITRATE (PF) 100 MCG/2ML IJ SOLN
INTRAMUSCULAR | Status: AC | PRN
  Administered 2024-09-17 (×2): 25 ug via INTRAVENOUS

## 2024-09-17 MED ORDER — ANTICOAGULANT SODIUM CITRATE 4% (200MG/5ML) IV SOLN: 5.0000 mL | Status: DC | PRN

## 2024-09-17 MED ORDER — INSULIN ASPART 100 UNIT/ML IJ SOLN
0.0000 [IU] | Freq: Three times a day (TID) | INTRAMUSCULAR | Status: DC
  Administered 2024-09-18: 1 [IU] via SUBCUTANEOUS

## 2024-09-17 MED ORDER — HEPARIN SODIUM (PORCINE) 1000 UNIT/ML DIALYSIS: 100.0000 [IU]/kg | INTRAMUSCULAR | Status: DC | PRN

## 2024-09-17 MED ORDER — PENTAFLUOROPROP-TETRAFLUOROETH EX AERO: 1.0000 | INHALATION_SPRAY | CUTANEOUS | Status: DC | PRN

## 2024-09-17 MED ORDER — HEPARIN SODIUM (PORCINE) 1000 UNIT/ML IJ SOLN
4000.0000 [IU] | Freq: Once | INTRAMUSCULAR | Status: AC
  Administered 2024-09-17: 3.2 mL via INTRAVENOUS

## 2024-09-17 MED ORDER — HEPARIN SODIUM (PORCINE) 1000 UNIT/ML IJ SOLN
INTRAMUSCULAR | Status: AC
  Filled 2024-09-17: qty 4

## 2024-09-17 MED ORDER — FENTANYL CITRATE (PF) 100 MCG/2ML IJ SOLN
INTRAMUSCULAR | Status: AC
  Filled 2024-09-17: qty 2

## 2024-09-17 MED ORDER — HEPARIN SODIUM (PORCINE) 1000 UNIT/ML DIALYSIS: 5000.0000 [IU] | INTRAMUSCULAR | Status: DC | PRN

## 2024-09-17 MED ORDER — HEPARIN SODIUM (PORCINE) 1000 UNIT/ML DIALYSIS
1000.0000 [IU] | INTRAMUSCULAR | Status: DC | PRN
  Administered 2024-09-17: 1000 [IU]

## 2024-09-17 MED ORDER — CEFAZOLIN SODIUM-DEXTROSE 2-4 GM/100ML-% IV SOLN
INTRAVENOUS | Status: AC
  Filled 2024-09-17: qty 100

## 2024-09-17 MED ORDER — MIDAZOLAM HCL 2 MG/2ML IJ SOLN
INTRAMUSCULAR | Status: AC | PRN
  Administered 2024-09-17 (×2): .5 mg via INTRAVENOUS

## 2024-09-17 MED ORDER — HEPARIN SODIUM (PORCINE) 1000 UNIT/ML IJ SOLN
INTRAMUSCULAR | Status: AC
  Filled 2024-09-17: qty 10

## 2024-09-17 NOTE — Progress Notes (Signed)
 The patient tolerated treatment well and goal met. DFR 300-350 ml/min.  09/17/24 1837  Vitals  Temp 98.2 F (36.8 C)  Temp Source Oral  BP (!) 180/90  BP Location Left Arm  BP Method Automatic  Patient Position (if appropriate) Lying  Pulse Rate 97  Resp 15  Oxygen Therapy  SpO2 100 %  O2 Device Room Air  During Treatment Monitoring  Intra-Hemodialysis Comments Tx completed  Post Treatment  Dialyzer Clearance Heavily streaked  Hemodialysis Intake (mL) 0 mL  Liters Processed 66  Fluid Removed (mL) 3000 mL  Tolerated HD Treatment Yes  Post-Hemodialysis Comments see notes.  Hemodialysis Catheter Right Internal jugular Double lumen Permanent (Tunneled)  Placement Date/Time: 09/17/24 0839   Placed prior to admission: No  Serial / Lot #: [id number]  Expiration Date: 02/21/29  Time Out: Correct patient;Correct site;Correct procedure  Maximum sterile barrier precautions: Hand hygiene;Large sterile sheet;S...  Site Condition No complications  Blue Lumen Status Heparin  locked  Red Lumen Status Heparin  locked  Catheter fill solution Heparin  1000 units/ml  Catheter fill volume (Arterial) 1.6 cc  Catheter fill volume (Venous) 1.6  Dressing Type Gauze/Drain sponge;Transparent  Dressing Status Antimicrobial disc/dressing in place;Clean, Dry, Intact  Interventions New dressing  Drainage Description None  Dressing Change Due 09/24/24  Post treatment catheter status Capped and Clamped

## 2024-09-17 NOTE — Progress Notes (Signed)
  [facility] Progress Note   Subjective: Seen in room. Just back from IR -- had TDC placed. Plan to resume dialysis today.   Objective Vitals:   09/17/24 0840 09/17/24 0845 09/17/24 0847 09/17/24 0911  BP: (!) 173/75 (!) 173/75 (!) 161/72 (!) 156/72  Pulse: 84 77 82 80  Resp: (!) 23 14 15 18   Temp:    98 F (36.7 C)  TempSrc:    Oral  SpO2: 100% 99% 98% 99%  Weight:      Height:        Additional Objective Labs: Basic Metabolic Panel: Recent Labs  Lab 09/15/24 1037 09/15/24 1054 09/16/24 0428 09/17/24 0429  NA 139 139 136 138  K 4.9 4.8 4.9 4.9  CL 101 108 99 102  CO2 20*  --  17* 19*  GLUCOSE 182* 182* 115* 111*  BUN 85* 74* 101* 114*  CREATININE 12.76* 14.00* 13.79* 15.93*  CALCIUM  9.1  --  8.3* 8.4*  PHOS  --   --  6.1* 6.2*   CBC: Recent Labs  Lab 09/15/24 1037 09/15/24 1054 09/16/24 0428  WBC 5.2  --  4.6  HGB 10.9* 11.2* 9.9*  HCT 34.1* 33.0* 30.3*  MCV 90.5  --  87.3  PLT 226  --  223   Blood Culture No results found for: SDES, SPECREQUEST, CULT, REPTSTATUS  Physical Exam General: Well appearing, nad Heart: RRR Lungs: Clear bilaterally  Abdomen: non -tender Extremities: no LE edema  Dialysis Access: RUE AVG w/o bruit: R TDC in place   Medications:   allopurinol   100 mg Oral Q T,Th,Sat-1800   aspirin  EC  81 mg Oral Daily   calcitRIOL   0.75 mcg Oral Q T,Th,Sa-HD   calcium  acetate  1,334 mg Oral TID WC   carvedilol   6.25 mg Oral BID WC   Chlorhexidine  Gluconate Cloth  6 each Topical Q0600   heparin   5,000 Units Subcutaneous Q8H   insulin  aspart  0-6 Units Subcutaneous TID WC   insulin  glargine  20 Units Subcutaneous QHS   multivitamin  1 tablet Oral QPM   rosuvastatin   10 mg Oral q AM    Dialysis Orders:  GOC TTS 4:00 BFR 400 EDW 88.6 kg  3K/2.5Ca  AVG Hep 8400   Assessment/Plan: 1.  Thrombosed right upper arm loop graft: R internal jugular TDC placed by IR this am. Vascular surgery recommends creation of new  permanent access - can be pursed as outpatient  2.  End-stage renal disease: Usual HD TTS. Off schedule due to access issues. Plan for dialysis today.  3.  Hypertension: Marginally elevated blood pressure, resume antihypertensive therapy and monitor with ultrafiltration and hemodialysis. 4.  Anemia: Hemoglobin and hematocrit currently at goal, no indication for ESA therapy at this time. 5.  Secondary hyperparathyroidism: Will follow calcium /phosphorus levels while in the hospital and resume binders along with calcitriol .  Maisie Ronnald Acosta PA-C [facility] 09/17/2024,9:50 AM

## 2024-09-17 NOTE — Progress Notes (Signed)
 PROGRESS NOTE    Katie Garcia  FMW:[id number] DOB: July 01, 1941 DOA: 09/15/2024 PCP: Elliot Charm, MD    Brief Narrative:  83 year old F with PMH of ESRD on HD TTS, DM-2 and HTN presenting with AVF malfunction.  Missed HD on Saturday due to this.  Patient reports prior history of AVF malfunction.  She says she was supposed to be on Eliquis  but she could not afford.  She takes baby aspirin .  Upon admission noted to have elevated blood pressure, IR and vascular consulted for [facility] placement.   Assessment & Plan:  ESRD on HD TTS/left AVF malfunction Nephrology, IR vascular consulted.  Status post HD catheter placement 9/24.  Plans for HD later today   Hypertensive urgency Resume home medication.  IV as needed.  Should improve with HD   IDDM-2 with hyperglycemia  A1c 6.9% in 01/2024.   Continue Lantus .  Sliding scale and Accu-Cheks   Gout -Continue allopurinol  TTS.   Hyperlipidemia - Continue Crestor    Morbid obesity: Elevated BMI with diabetes and ESRD. Body mass index is 37.5 kg/m.   DVT prophylaxis: heparin  injection 5,000 Units Start: 09/15/24 1400  Code Status: Full code Family Communication: None at bedside Level of care: Telemetry Medical Status is: Observation The patient will require care spanning > 2 midnights and should be moved to inpatient because: Due to AVF malfunction     PT Follow up Recs:   Subjective: Seen at bedside after [facility] placement this morning.  No complaints.   Examination:  General exam: Appears calm and comfortable  Respiratory system: Clear to auscultation. Respiratory effort normal. Cardiovascular system: S1 & S2 heard, RRR. No JVD, murmurs, rubs, gallops or clicks. No pedal edema. Gastrointestinal system: Abdomen is nondistended, soft and nontender. No organomegaly or masses felt. Normal bowel sounds heard. Central nervous system: Alert and oriented. No focal neurological deficits. Extremities: Symmetric 5 x 5 power. Skin: No  rashes, lesions or ulcers Psychiatry: Judgement and insight appear normal. Mood & affect appropriate.                Diet Orders (From admission, onward)     Start     Ordered   09/17/24 1021  Diet renal with fluid restriction Fluid restriction: 1500 mL Fluid; Room service appropriate? Yes; Fluid consistency: Thin  Diet effective now       Question Answer Comment  Fluid restriction: 1500 mL Fluid   Room service appropriate? Yes   Fluid consistency: Thin      09/17/24 1021            Objective: Vitals:   09/17/24 0840 09/17/24 0845 09/17/24 0847 09/17/24 0911  BP: (!) 173/75 (!) 173/75 (!) 161/72 (!) 156/72  Pulse: 84 77 82 80  Resp: (!) 23 14 15 18   Temp:    98 F (36.7 C)  TempSrc:    Oral  SpO2: 100% 99% 98% 99%  Weight:      Height:        Intake/Output Summary (Last 24 hours) at 09/17/2024 1106 Last data filed at 09/17/2024 0911 Gross per 24 hour  Intake 400 ml  Output 0 ml  Net 400 ml   Filed Weights   09/15/24 1029  Weight: 87.1 kg    Scheduled Meds:  allopurinol   100 mg Oral Q T,Th,Sat-1800   aspirin  EC  81 mg Oral Daily   calcitRIOL   0.75 mcg Oral Q T,Th,Sa-HD   calcium  acetate  1,334 mg Oral TID WC   carvedilol   6.25  mg Oral BID WC   Chlorhexidine  Gluconate Cloth  6 each Topical Q0600   heparin   5,000 Units Subcutaneous Q8H   insulin  aspart  0-6 Units Subcutaneous TID WC   insulin  glargine  20 Units Subcutaneous QHS   multivitamin  1 tablet Oral QPM   rosuvastatin   10 mg Oral q AM   Continuous Infusions:  Nutritional status     Body mass index is 37.5 kg/m.  Data Reviewed:   CBC: Recent Labs  Lab 09/15/24 1037 09/15/24 1054 09/16/24 0428  WBC 5.2  --  4.6  HGB 10.9* 11.2* 9.9*  HCT 34.1* 33.0* 30.3*  MCV 90.5  --  87.3  PLT 226  --  223   Basic Metabolic Panel: Recent Labs  Lab 09/15/24 1037 09/15/24 1054 09/16/24 0428 09/17/24 0429  NA 139 139 136 138  K 4.9 4.8 4.9 4.9  CL 101 108 99 102  CO2 20*  --  17*  19*  GLUCOSE 182* 182* 115* 111*  BUN 85* 74* 101* 114*  CREATININE 12.76* 14.00* 13.79* 15.93*  CALCIUM  9.1  --  8.3* 8.4*  PHOS  --   --  6.1* 6.2*   GFR: Estimated Creatinine Clearance: 2.7 mL/min (A) (by C-G formula based on SCr of 15.93 mg/dL (H)). Liver Function Tests: Recent Labs  Lab 09/16/24 0428 09/17/24 0429  ALBUMIN  2.8* 2.8*   No results for input(s): LIPASE, AMYLASE in the last 168 hours. No results for input(s): AMMONIA in the last 168 hours. Coagulation Profile: No results for input(s): INR, PROTIME in the last 168 hours. Cardiac Enzymes: No results for input(s): CKTOTAL, CKMB, CKMBINDEX, TROPONINI in the last 168 hours. BNP (last 3 results) No results for input(s): PROBNP in the last 8760 hours. HbA1C: Recent Labs    09/17/24 0429  HGBA1C 7.5*   CBG: Recent Labs  Lab 09/15/24 2112 09/16/24 0713 09/16/24 1102 09/16/24 1655 09/16/24 2014  GLUCAP 200* 98 116* 166* 227*   Lipid Profile: No results for input(s): CHOL, HDL, LDLCALC, TRIG, CHOLHDL, LDLDIRECT in the last 72 hours. Thyroid Function Tests: No results for input(s): TSH, T4TOTAL, FREET4, T3FREE, THYROIDAB in the last 72 hours. Anemia Panel: No results for input(s): VITAMINB12, FOLATE, FERRITIN, TIBC, IRON, RETICCTPCT in the last 72 hours. Sepsis Labs: No results for input(s): PROCALCITON, LATICACIDVEN in the last 168 hours.  Recent Results (from the past 240 hours)  MRSA Next Gen by PCR, Nasal     Status: None   Collection Time: 09/15/24  4:09 PM   Specimen: Nasal Mucosa; Nasal Swab  Result Value Ref Range Status   MRSA by PCR Next Gen NOT DETECTED NOT DETECTED Final    Comment: (NOTE) The GeneXpert MRSA Assay (FDA approved for NASAL specimens only), is one component of a comprehensive MRSA colonization surveillance program. It is not intended to diagnose MRSA infection nor to guide or monitor treatment for MRSA infections. Test  performance is not FDA approved in patients less than 80 years old. Performed at [facility], [address], [place], [place] [address]          Radiology Studies: No results found.         LOS: 0 days   Time spent= 35 mins    Burgess JAYSON Dare, MD [facility]  If 7PM-7AM, please contact night-coverage  09/17/2024, 11:06 AM

## 2024-09-17 NOTE — Hospital Course (Addendum)
 Brief Narrative:  83 year old F with PMH of ESRD on HD TTS, DM-2 and HTN presenting with AVF malfunction.  Missed HD on Saturday due to this.  Patient reports prior history of AVF malfunction.  She says she was supposed to be on Eliquis  but she could not afford.  She takes baby aspirin .  Upon admission noted to have elevated blood pressure, IR and vascular consulted for [facility] placement.   Assessment & Plan:  ESRD on HD TTS/left AVF malfunction Nephrology, IR vascular consulted.  Status post HD catheter placement 9/24.  Plans for HD later today   Hypertensive urgency Resume home medication.  IV as needed.  Should improve with HD   IDDM-2 with hyperglycemia  A1c 6.9% in 01/2024.   Continue Lantus .  Sliding scale and Accu-Cheks   Gout -Continue allopurinol  TTS.   Hyperlipidemia - Continue Crestor    Morbid obesity: Elevated BMI with diabetes and ESRD. Body mass index is 37.5 kg/m.   DVT prophylaxis: heparin  injection 5,000 Units Start: 09/15/24 1400  Code Status: Full code Family Communication: None at bedside Level of care: Telemetry Medical Status is: Observation The patient will require care spanning > 2 midnights and should be moved to inpatient because: Due to AVF malfunction     PT Follow up Recs:   Subjective: Seen at bedside after [facility] placement this morning.  No complaints.   Examination:  General exam: Appears calm and comfortable  Respiratory system: Clear to auscultation. Respiratory effort normal. Cardiovascular system: S1 & S2 heard, RRR. No JVD, murmurs, rubs, gallops or clicks. No pedal edema. Gastrointestinal system: Abdomen is nondistended, soft and nontender. No organomegaly or masses felt. Normal bowel sounds heard. Central nervous system: Alert and oriented. No focal neurological deficits. Extremities: Symmetric 5 x 5 power. Skin: No rashes, lesions or ulcers Psychiatry: Judgement and insight appear normal. Mood & affect appropriate.

## 2024-09-17 NOTE — Procedures (Signed)
 Interventional Radiology Procedure Note  Procedure: Tunneled hemodialysis catheter placement   Findings: Please refer to procedural dictation for full description. 19 cm tunneled right internal jugular catheter placement.  Complications: None immediate  Estimated Blood Loss: < 5 ml  Recommendations: Catheter ready for immediate use.   Ester Sides, MD

## 2024-09-18 ENCOUNTER — Encounter (HOSPITAL_COMMUNITY): Payer: Self-pay

## 2024-09-18 ENCOUNTER — Other Ambulatory Visit (HOSPITAL_COMMUNITY): Payer: Self-pay

## 2024-09-18 DIAGNOSIS — T82898A Other specified complication of vascular prosthetic devices, implants and grafts, initial encounter: Secondary | ICD-10-CM | POA: Diagnosis not present

## 2024-09-18 LAB — RENAL FUNCTION PANEL
Albumin: 3 g/dL — ABNORMAL LOW (ref 3.5–5.0)
Anion gap: 11 (ref 5–15)
BUN: 54 mg/dL — ABNORMAL HIGH (ref 8–23)
CO2: 23 mmol/L (ref 22–32)
Calcium: 8.3 mg/dL — ABNORMAL LOW (ref 8.9–10.3)
Chloride: 100 mmol/L (ref 98–111)
Creatinine, Ser: 9.91 mg/dL — ABNORMAL HIGH (ref 0.44–1.00)
GFR, Estimated: 4 mL/min — ABNORMAL LOW (ref >60)
Glucose, Bld: 74 mg/dL (ref 70–99)
Phosphorus: 5.3 mg/dL — ABNORMAL HIGH (ref 2.5–4.6)
Potassium: 4 mmol/L (ref 3.5–5.1)
Sodium: 134 mmol/L — ABNORMAL LOW (ref 135–145)

## 2024-09-18 LAB — GLUCOSE, CAPILLARY
Glucose-Capillary: 59 mg/dL — ABNORMAL LOW (ref 70–99)
Glucose-Capillary: 172 mg/dL — ABNORMAL HIGH (ref 70–99)

## 2024-09-18 MED ORDER — CALCITRIOL 0.25 MCG PO CAPS
0.7500 ug | ORAL_CAPSULE | ORAL | 0 refills | Status: DC
  Filled 2024-09-18: qty 36, 28d supply, fill #0

## 2024-09-18 MED ORDER — SODIUM CHLORIDE 0.9% FLUSH: 10.0000 mL | INTRAVENOUS | Status: DC | PRN

## 2024-09-18 MED ORDER — CALCITRIOL 0.25 MCG PO CAPS: 0.7500 ug | ORAL_CAPSULE | ORAL | 0 refills | Status: AC

## 2024-09-18 NOTE — TOC Transition Note (Signed)
 Transition of Care [facility]) - Discharge Note   Patient Details  Name: Katie Garcia MRN: [id number] Date of Birth: 1941/06/02  Transition of Care [facility]) CM/SW Contact:  Tom-Johnson, Aarron Wierzbicki Daphne, RN Phone Number: 09/18/2024, 12:02 PM   Clinical Narrative:     Patient is scheduled for discharge today.  Outpatient f/u, hospital f/u and discharge instructions on AVS. No ICM needs or recommendations noted. Daughter, [name] at bedside and will transport at discharge.  No further ICM needs noted.       Final next level of care: Home/Self Care Barriers to Discharge: Barriers Resolved   Patient Goals and CMS Choice Patient states their goals for this hospitalization and ongoing recovery are:: To return home CMS Medicare.gov Compare Post Acute Care list provided to:: Patient Choice offered to / list presented to : NA      Discharge Placement                Patient to be transferred to facility by: Daughter Name of family member notified: Ann    Discharge Plan and Services Additional resources added to the After Visit Summary for                  DME Arranged: N/A DME Agency: NA       HH Arranged: NA HH Agency: NA        Social Drivers of Health (SDOH) Interventions SDOH Screenings   Food Insecurity: No Food Insecurity (09/15/2024)  Housing: Low Risk  (09/15/2024)  Transportation Needs: No Transportation Needs (09/15/2024)  Utilities: Not At Risk (09/15/2024)  Social Connections: Patient Declined (09/15/2024)  Tobacco Use: Low Risk  (09/15/2024)     Readmission Risk Interventions     No data to display

## 2024-09-18 NOTE — Discharge Planning (Signed)
 [facility] Patient Discharge Orders- [facility] CLINIC: GOC  Patient's name: Katie Garcia Admit/DC Dates: 09/15/2024 - 09/18/2024  Discharge Diagnoses: Clotted AV graft. TDC placed by IR and was dialyzed on 9/24.  Outpatient Dialysis Orders:  -Heparin : No change -EDW 85 kg   -[place]: No change   Anemia Aranesp : Given: --   Date of last dose/amount: --   PRBC's Given: -- Date/# of units: -- ESA dose for discharge: per protocol   Recent Labs  Lab 09/16/24 0428 09/17/24 0429 09/18/24 0419  HGB 9.9*  --   --   K 4.9   < > 4.0  CALCIUM  8.3*   < > 8.3*  PHOS 6.1*   < > 5.3*  ALBUMIN  2.8*   < > 3.0*   < > = values in this interval not displayed.   Access intervention/Change:  R internal jugular TDC placed 09/17/24 per IR Will need to follow up with [facility] for new access creation   Medications: -IV Antibiotics: ---  OTHER/APPTS/LABS    Completed by: Maisie Ronnald Acosta PA-C   D/C Meds to be reconciled by nurse after every discharge.    Reviewed by: MD:______ RN_______

## 2024-09-18 NOTE — Progress Notes (Addendum)
 Pt has DC order, AVS was given and explained to pt and daughter. Printed AVS was given to daughter. Daughter to provide transport.

## 2024-09-18 NOTE — Discharge Summary (Signed)
 Physician Discharge Summary  Katie Garcia FMW:[id number] DOB: Feb 06, 1941 DOA: 09/15/2024  PCP: Elliot Charm, MD  Admit date: 09/15/2024 Discharge date: 09/18/2024  Admitted From: Home Disposition: Home  Recommendations for Outpatient Follow-up:  Follow up with PCP in 1-2 weeks Please obtain BMP/CBC in one week your next doctors visit.  Outpatient follow-up with vascular and nephrology team  Discharge Condition: Stable CODE STATUS: Full code Diet recommendation: Renal  Brief/Interim Summary: Brief Narrative:  83 year old F with PMH of ESRD on HD TTS, DM-2 and HTN presenting with AVF malfunction.  Missed HD on Saturday due to this.  Patient reports prior history of AVF malfunction.  She says she was supposed to be on Eliquis  but she could not afford.  She takes baby aspirin .  Upon admission noted to have elevated blood pressure, IR and vascular consulted for [facility] placement. Status post HD catheter placement 9/24.  HD per nephrology.  Vascular eventually work on creation of new permanent access which will be pursued outpatient.   Assessment & Plan:  ESRD on HD TTS/left AVF malfunction Nephrology, IR vascular consulted.  Status post HD catheter placement 9/24.  HD per nephrology.  Vascular eventually work on creation of new permanent access which will be pursued outpatient.   Hypertensive urgency; improved.  Resume home medication.  IV as needed.     IDDM-2 with hyperglycemia  A1c 6.9% in 01/2024.   Continue Lantus .  Sliding scale and Accu-Cheks   Gout -Continue allopurinol  TTS.   Hyperlipidemia - Continue Crestor    Morbid obesity: Elevated BMI with diabetes and ESRD. Body mass index is 37.5 kg/m.   DVT prophylaxis: heparin  injection 5,000 Units Start: 09/15/24 1400  Code Status: Full code Family Communication: None at bedside Level of care: Telemetry Medical Hopefully dc today     PT Follow up Recs:   Subjective: No complaints feeling  well   Examination:  General exam: Appears calm and comfortable  Respiratory system: Clear to auscultation. Respiratory effort normal. Cardiovascular system: S1 & S2 heard, RRR. No JVD, murmurs, rubs, gallops or clicks. No pedal edema. Gastrointestinal system: Abdomen is nondistended, soft and nontender. No organomegaly or masses felt. Normal bowel sounds heard. Central nervous system: Alert and oriented. No focal neurological deficits. Extremities: Symmetric 5 x 5 power. Skin: No rashes, lesions or ulcers Psychiatry: Judgement and insight appear normal. Mood & affect appropriate.    Discharge Diagnoses:  Principal Problem:   AV fistula occlusion, initial encounter Active Problems:   Hypertensive urgency   Controlled type 2 diabetes mellitus with complication, with long-term current use of insulin  (HCC)   Hyperlipidemia   Obesity, Class II, BMI 35-39.9      Discharge Exam: Vitals:   09/18/24 0520 09/18/24 0753  BP: (!) 164/87 (!) 127/55  Pulse: 88 99  Resp: 18 17  Temp: 98.8 F (37.1 C) 98 F (36.7 C)  SpO2: 100% 100%   Vitals:   09/17/24 1903 09/17/24 2020 09/18/24 0520 09/18/24 0753  BP: (!) 180/83 (!) 171/71 (!) 164/87 (!) 127/55  Pulse: 89 94 88 99  Resp: 18 15 18 17   Temp: 98.6 F (37 C) 99 F (37.2 C) 98.8 F (37.1 C) 98 F (36.7 C)  TempSrc: Oral Oral Oral Oral  SpO2: 100% 100% 100% 100%  Weight:      Height:          Discharge Instructions   Allergies as of 09/18/2024       Reactions   Penicillins Rash  Medication List     TAKE these medications    acetaminophen  500 MG tablet Commonly known as: TYLENOL  Take 500-1,000 mg by mouth every 6 (six) hours as needed for moderate pain or headache.   allopurinol  100 MG tablet Commonly known as: ZYLOPRIM  Take 100 mg by mouth every Tuesday, Thursday, and Saturday at 6 PM. In the morning after dialysis   aspirin  EC 81 MG tablet Take 1 tablet (81 mg total) by mouth daily. Swallow  whole.   calcitRIOL  0.25 MCG capsule Commonly known as: ROCALTROL  Take 3 capsules (0.75 mcg total) by mouth Every Tuesday,Thursday,and Saturday with dialysis.   calcium  acetate 667 MG capsule Commonly known as: PHOSLO  Take 1,334 mg by mouth 3 (three) times daily with meals.   carvedilol  12.5 MG tablet Commonly known as: COREG  Take 6.25 mg by mouth in the morning and at bedtime.   Dexcom G7 Sensor Misc CHANGE EVERY 10 DAYS   Dextromethorphan-guaiFENesin  10-200 MG/5ML Liqd Take 3 mLs by mouth daily as needed (Cough).   Dialyvite 800 0.8 MG Tabs Take 1 tablet by mouth every evening.   guaifenesin  100 MG/5ML syrup Commonly known as: ROBITUSSIN Take 100 mg by mouth 3 (three) times daily as needed for cough.   insulin  regular 100 units/mL injection Commonly known as: NOVOLIN R Inject 20 Units into the skin daily before breakfast.   Lantus  SoloStar 100 UNIT/ML Solostar Pen Generic drug: insulin  glargine INJECT 30 UNITS INTO THE SKIN DAILY. ADJUST AS DIRECTED What changed:  how much to take when to take this   loratadine  10 MG tablet Commonly known as: CLARITIN  Take 10 mg by mouth daily.   rosuvastatin  10 MG tablet Commonly known as: CRESTOR  Take 10 mg by mouth in the morning.   Symbicort 160-4.5 MCG/ACT inhaler Generic drug: budesonide-formoterol Inhale 2 puffs into the lungs 2 (two) times daily as needed (Asthma).        Follow-up Information     [facility] Follow up.   Specialty: Vascular Surgery Why: Our office will call you to arrange follow up for dialysis access Contact information: [address] [place]  [address] [phone]        Elliot Charm, MD Follow up in 1 week(s).   Specialty: Internal Medicine Contact information: [address] [place] [address] [phone]                Allergies  Allergen Reactions    Penicillins Rash    You were cared for by a hospitalist during your hospital stay. If you have any questions about your discharge medications or the care you received while you were in the hospital after you are discharged, you can call the unit and asked to speak with the hospitalist on call if the hospitalist that took care of you is not available. Once you are discharged, your primary care physician will handle any further medical issues. Please note that no refills for any discharge medications will be authorized once you are discharged, as it is imperative that you return to your primary care physician (or establish a relationship with a primary care physician if you do not have one) for your aftercare needs so that they can reassess your need for medications and monitor your lab values.  You were cared for by a hospitalist during your hospital stay. If you have any questions about your discharge medications or the care  you received while you were in the hospital after you are discharged, you can call the unit and asked to speak with the hospitalist on call if the hospitalist that took care of you is not available. Once you are discharged, your primary care physician will handle any further medical issues. Please note that NO REFILLS for any discharge medications will be authorized once you are discharged, as it is imperative that you return to your primary care physician (or establish a relationship with a primary care physician if you do not have one) for your aftercare needs so that they can reassess your need for medications and monitor your lab values.  Please request your Prim.MD to go over all Hospital Tests and Procedure/Radiological results at the follow up, please get all Hospital records sent to your Prim MD by signing hospital release before you go home.  Get CBC, CMP, 2 view Chest X ray checked  by Primary MD during your next visit or SNF MD in 5-7 days ( we routinely change or add  medications that can affect your baseline labs and fluid status, therefore we recommend that you get the mentioned basic workup next visit with your PCP, your PCP may decide not to get them or add new tests based on their clinical decision)  On your next visit with your primary care physician please Get Medicines reviewed and adjusted.  If you experience worsening of your admission symptoms, develop shortness of breath, life threatening emergency, suicidal or homicidal thoughts you must seek medical attention immediately by calling 911 or calling your MD immediately  if symptoms less severe.  You Must read complete instructions/literature along with all the possible adverse reactions/side effects for all the Medicines you take and that have been prescribed to you. Take any new Medicines after you have completely understood and accpet all the possible adverse reactions/side effects.   Do not drive, operate heavy machinery, perform activities at heights, swimming or participation in water activities or provide baby sitting services if your were admitted for syncope or siezures until you have seen by Primary MD or a Neurologist and advised to do so again.  Do not drive when taking Pain medications.   Procedures/Studies: IR TUNNELED CENTRAL VENOUS CATH PLC W IMG Result Date: 09/17/2024 INDICATION: 83 year old female with history of end-stage renal disease on hemodialysis with malfunctioning fistula. EXAM: TUNNELED CENTRAL VENOUS HEMODIALYSIS CATHETER PLACEMENT WITH ULTRASOUND AND FLUOROSCOPIC GUIDANCE MEDICATIONS: Ancef  2 gm IV . The antibiotic was given in an appropriate time interval prior to skin puncture. ANESTHESIA/SEDATION: Moderate (conscious) sedation was employed during this procedure. A total of Versed  1 mg and Fentanyl  20 mcg was administered intravenously. Moderate Sedation Time: 11 minutes. The patient's level of consciousness and vital signs were monitored continuously by radiology nursing  throughout the procedure under my direct supervision. FLUOROSCOPY TIME:  Five mGy reference air kerma COMPLICATIONS: None immediate. PROCEDURE: Informed written consent was obtained from the patient after a discussion of the risks, benefits, and alternatives to treatment. Questions regarding the procedure were encouraged and answered. The right neck and chest were prepped with chlorhexidine  in a sterile fashion, and a sterile drape was applied covering the operative field. Maximum barrier sterile technique with sterile gowns and gloves were used for the procedure. A timeout was performed prior to the initiation of the procedure. After creating a small venotomy incision, a 21 gauge micropuncture kit was utilized to access the internal jugular vein. Real-time ultrasound guidance was utilized for vascular access including the acquisition  of a permanent ultrasound image documenting patency of the accessed vessel. A Rosen wire was advanced to the level of the IVC and the micropuncture sheath was exchanged for an 8 Fr dilator. A 14.5 French tunneled hemodialysis catheter measuring 19 cm from tip to cuff was tunneled in a retrograde fashion from the anterior chest wall to the venotomy incision. Serial dilation was then performed an a peel-away sheath was placed. The catheter was then placed through the peel-away sheath with the catheter tip ultimately positioned within the right atrium. Final catheter positioning was confirmed and documented with a spot radiographic image. The catheter aspirates and flushes normally. The catheter was flushed with appropriate volume heparin  dwells. The catheter exit site was secured with a 0-Silk retention suture. The venotomy incision was closed with Dermabond. Sterile dressings were applied. The patient tolerated the procedure well without immediate post procedural complication. IMPRESSION: Successful placement of 19 cm tip to cuff tunneled hemodialysis catheter via the right internal  jugular vein with catheter tip terminating within the right atrium. The catheter is ready for immediate use. Ester Sides, MD [facility] Electronically Signed   By: Ester Sides M.D.   On: 09/17/2024 12:06     The results of significant diagnostics from this hospitalization (including imaging, microbiology, ancillary and laboratory) are listed below for reference.     Microbiology: Recent Results (from the past 240 hours)  MRSA Next Gen by PCR, Nasal     Status: None   Collection Time: 09/15/24  4:09 PM   Specimen: Nasal Mucosa; Nasal Swab  Result Value Ref Range Status   MRSA by PCR Next Gen NOT DETECTED NOT DETECTED Final    Comment: (NOTE) The GeneXpert MRSA Assay (FDA approved for NASAL specimens only), is one component of a comprehensive MRSA colonization surveillance program. It is not intended to diagnose MRSA infection nor to guide or monitor treatment for MRSA infections. Test performance is not FDA approved in patients less than 50 years old. Performed at [facility], [address] [place], [place] [address]      Labs: BNP (last 3 results) No results for input(s): BNP in the last 8760 hours. Basic Metabolic Panel: Recent Labs  Lab 09/15/24 1037 09/15/24 1054 09/16/24 0428 09/17/24 0429 09/18/24 0419  NA 139 139 136 138 134*  K 4.9 4.8 4.9 4.9 4.0  CL 101 108 99 102 100  CO2 20*  --  17* 19* 23  GLUCOSE 182* 182* 115* 111* 74  BUN 85* 74* 101* 114* 54*  CREATININE 12.76* 14.00* 13.79* 15.93* 9.91*  CALCIUM  9.1  --  8.3* 8.4* 8.3*  PHOS  --   --  6.1* 6.2* 5.3*   Liver Function Tests: Recent Labs  Lab 09/16/24 0428 09/17/24 0429 09/18/24 0419  ALBUMIN  2.8* 2.8* 3.0*   No results for input(s): LIPASE, AMYLASE in the last 168 hours. No results for input(s): AMMONIA in the last 168 hours. CBC: Recent Labs  Lab 09/15/24 1037 09/15/24 1054 09/16/24 0428  WBC 5.2  --  4.6   HGB 10.9* 11.2* 9.9*  HCT 34.1* 33.0* 30.3*  MCV 90.5  --  87.3  PLT 226  --  223   Cardiac Enzymes: No results for input(s): CKTOTAL, CKMB, CKMBINDEX, TROPONINI in the last 168 hours. BNP: Invalid input(s): POCBNP CBG: Recent Labs  Lab 09/17/24 1204 09/17/24 1900 09/17/24 2025 09/18/24 0604 09/18/24 0752  GLUCAP 93 109* 218* 59* 172*   D-Dimer No results  for input(s): DDIMER in the last 72 hours. Hgb A1c Recent Labs    09/17/24 0429  HGBA1C 7.5*   Lipid Profile No results for input(s): CHOL, HDL, LDLCALC, TRIG, CHOLHDL, LDLDIRECT in the last 72 hours. Thyroid function studies No results for input(s): TSH, T4TOTAL, T3FREE, THYROIDAB in the last 72 hours.  Invalid input(s): FREET3 Anemia work up No results for input(s): VITAMINB12, FOLATE, FERRITIN, TIBC, IRON, RETICCTPCT in the last 72 hours. Urinalysis    Component Value Date/Time   COLORURINE YELLOW 06/26/2016 1135   APPEARANCEUR CLOUDY (A) 06/26/2016 1135   LABSPEC 1.018 06/26/2016 1135   PHURINE 5.5 06/26/2016 1135   GLUCOSEU 250 (A) 06/26/2016 1135   HGBUR SMALL (A) 06/26/2016 1135   BILIRUBINUR NEGATIVE 06/26/2016 1135   KETONESUR NEGATIVE 06/26/2016 1135   PROTEINUR >300 (A) 06/26/2016 1135   UROBILINOGEN 0.2 03/05/2014 0013   NITRITE NEGATIVE 06/26/2016 1135   LEUKOCYTESUR SMALL (A) 06/26/2016 1135   Sepsis Labs Recent Labs  Lab 09/15/24 1037 09/16/24 0428  WBC 5.2 4.6   Microbiology Recent Results (from the past 240 hours)  MRSA Next Gen by PCR, Nasal     Status: None   Collection Time: 09/15/24  4:09 PM   Specimen: Nasal Mucosa; Nasal Swab  Result Value Ref Range Status   MRSA by PCR Next Gen NOT DETECTED NOT DETECTED Final    Comment: (NOTE) The GeneXpert MRSA Assay (FDA approved for NASAL specimens only), is one component of a comprehensive MRSA colonization surveillance program. It is not intended to diagnose MRSA infection nor to  guide or monitor treatment for MRSA infections. Test performance is not FDA approved in patients less than 65 years old. Performed at [facility], [address], [place], [place] [address]      Time coordinating discharge:  I have spent 35 minutes face to face with the patient and on the ward discussing the patients care, assessment, plan and disposition with other care givers. >50% of the time was devoted counseling the patient about the risks and benefits of treatment/Discharge disposition and coordinating care.   SIGNED:   Burgess JAYSON Dare, MD  [facility] 09/18/2024, 11:58 AM   If 7PM-7AM, please contact night-coverage

## 2024-09-18 NOTE — Progress Notes (Signed)
 D/C order noted. Contacted [facility]eron[facility] to be advised of pt's d/c today and that pt should resume care on Saturday.   Randine Mungo Dialysis Navigator [phone]

## 2024-09-18 NOTE — Progress Notes (Signed)
  [facility] Progress Note   Subjective: Seen in room. Completed dialysis last night via TDC. Blood pressure better this am. She has no complaints and is ready to go home.   Objective Vitals:   09/17/24 1903 09/17/24 2020 09/18/24 0520 09/18/24 0753  BP: (!) 180/83 (!) 171/71 (!) 164/87 (!) 127/55  Pulse: 89 94 88 99  Resp: 18 15 18 17   Temp: 98.6 F (37 C) 99 F (37.2 C) 98.8 F (37.1 C) 98 F (36.7 C)  TempSrc: Oral Oral Oral Oral  SpO2: 100% 100% 100% 100%  Weight:      Height:        Additional Objective Labs: Basic Metabolic Panel: Recent Labs  Lab 09/16/24 0428 09/17/24 0429 09/18/24 0419  NA 136 138 134*  K 4.9 4.9 4.0  CL 99 102 100  CO2 17* 19* 23  GLUCOSE 115* 111* 74  BUN 101* 114* 54*  CREATININE 13.79* 15.93* 9.91*  CALCIUM  8.3* 8.4* 8.3*  PHOS 6.1* 6.2* 5.3*   CBC: Recent Labs  Lab 09/15/24 1037 09/15/24 1054 09/16/24 0428  WBC 5.2  --  4.6  HGB 10.9* 11.2* 9.9*  HCT 34.1* 33.0* 30.3*  MCV 90.5  --  87.3  PLT 226  --  223   Blood Culture No results found for: SDES, SPECREQUEST, CULT, REPTSTATUS  Physical Exam General: Well appearing, nad Heart: RRR Lungs: Clear bilaterally  Abdomen: non -tender Extremities: no LE edema  Dialysis Access: RUE AVG w/o bruit: R TDC in place   Medications:   allopurinol   100 mg Oral Q T,Th,Sat-1800   aspirin  EC  81 mg Oral Daily   calcitRIOL   0.75 mcg Oral Q T,Th,Sa-HD   calcium  acetate  1,334 mg Oral TID WC   carvedilol   6.25 mg Oral BID WC   Chlorhexidine  Gluconate Cloth  6 each Topical Q0600   heparin   5,000 Units Subcutaneous Q8H   insulin  aspart  0-6 Units Subcutaneous TID WC   insulin  glargine  20 Units Subcutaneous QHS   multivitamin  1 tablet Oral QPM   rosuvastatin   10 mg Oral q AM    Dialysis Orders:  GOC TTS 4:00 BFR 400 EDW 88.6 kg  3K/2.5Ca  AVG Hep 8400   Assessment/Plan: 1.  Thrombosed right upper arm loop graft: R internal jugular TDC placed by IR 9/24 .  Vascular surgery recommends creation of new permanent access - can be pursed as outpatient  2.  End-stage renal disease: Usual HD TTS. Off schedule due to access issues. Completed dialysis Wednesday. No indication for dialysis today. Resume regular dialysis Saturday.  3.  Hypertension: Marginally elevated blood pressure, resume antihypertensive therapy and monitor with ultrafiltration and hemodialysis. 4.  Anemia: Hemoglobin and hematocrit currently at goal, no indication for ESA therapy at this time. 5.  Secondary hyperparathyroidism: Will follow calcium /phosphorus levels while in the hospital and resume binders along with calcitriol .  Maisie Ronnald Acosta PA-C [facility] 09/18/2024,8:56 AM

## 2024-09-19 ENCOUNTER — Telehealth (HOSPITAL_COMMUNITY): Payer: Self-pay | Admitting: Nephrology

## 2024-09-19 NOTE — Telephone Encounter (Signed)
 Transition of Care - Initial Contact after Hospitalization  Date of discharge:  09/18/24 Date of contact: 09/19/24  Method: Phone Spoke to: Patient's daughter [name]  Patient contacted to discuss transition of care from recent inpatient hospitalization. Patient was admitted to [facility] from 9/23-9/25/25 with Dx clotted AVF. She had TDC placed during admit.  The discharge medication list was reviewed. Daughter asking if ok to take tylenol  for arm soreness - yes.  Patient will return to his/her outpatient HD unit on: tomorrow.  No other concerns at this time.  Izetta Boehringer, PA-C [employer] Pager [phone]

## 2024-09-20 DIAGNOSIS — D688 Other specified coagulation defects: Secondary | ICD-10-CM | POA: Diagnosis not present

## 2024-09-20 DIAGNOSIS — N186 End stage renal disease: Secondary | ICD-10-CM | POA: Diagnosis not present

## 2024-09-20 DIAGNOSIS — D631 Anemia in chronic kidney disease: Secondary | ICD-10-CM | POA: Diagnosis not present

## 2024-09-20 DIAGNOSIS — E1129 Type 2 diabetes mellitus with other diabetic kidney complication: Secondary | ICD-10-CM | POA: Diagnosis not present

## 2024-09-20 DIAGNOSIS — N2581 Secondary hyperparathyroidism of renal origin: Secondary | ICD-10-CM | POA: Diagnosis not present

## 2024-09-20 DIAGNOSIS — Z992 Dependence on renal dialysis: Secondary | ICD-10-CM | POA: Diagnosis not present

## 2024-09-20 DIAGNOSIS — R519 Headache, unspecified: Secondary | ICD-10-CM | POA: Diagnosis not present

## 2024-09-23 DIAGNOSIS — E1129 Type 2 diabetes mellitus with other diabetic kidney complication: Secondary | ICD-10-CM | POA: Diagnosis not present

## 2024-09-23 DIAGNOSIS — D631 Anemia in chronic kidney disease: Secondary | ICD-10-CM | POA: Diagnosis not present

## 2024-09-23 DIAGNOSIS — Z992 Dependence on renal dialysis: Secondary | ICD-10-CM | POA: Diagnosis not present

## 2024-09-23 DIAGNOSIS — D688 Other specified coagulation defects: Secondary | ICD-10-CM | POA: Diagnosis not present

## 2024-09-23 DIAGNOSIS — N2581 Secondary hyperparathyroidism of renal origin: Secondary | ICD-10-CM | POA: Diagnosis not present

## 2024-09-23 DIAGNOSIS — R519 Headache, unspecified: Secondary | ICD-10-CM | POA: Diagnosis not present

## 2024-09-23 DIAGNOSIS — N186 End stage renal disease: Secondary | ICD-10-CM | POA: Diagnosis not present

## 2024-09-23 DIAGNOSIS — E1122 Type 2 diabetes mellitus with diabetic chronic kidney disease: Secondary | ICD-10-CM | POA: Diagnosis not present

## 2024-09-25 DIAGNOSIS — Z992 Dependence on renal dialysis: Secondary | ICD-10-CM | POA: Diagnosis not present

## 2024-09-25 DIAGNOSIS — E1122 Type 2 diabetes mellitus with diabetic chronic kidney disease: Secondary | ICD-10-CM | POA: Diagnosis not present

## 2024-09-25 DIAGNOSIS — T82898A Other specified complication of vascular prosthetic devices, implants and grafts, initial encounter: Secondary | ICD-10-CM | POA: Diagnosis not present

## 2024-09-25 DIAGNOSIS — N186 End stage renal disease: Secondary | ICD-10-CM | POA: Diagnosis not present

## 2024-09-26 DIAGNOSIS — E1122 Type 2 diabetes mellitus with diabetic chronic kidney disease: Secondary | ICD-10-CM | POA: Diagnosis not present

## 2024-09-26 DIAGNOSIS — N186 End stage renal disease: Secondary | ICD-10-CM | POA: Diagnosis not present

## 2024-10-15 ENCOUNTER — Encounter: Payer: Self-pay | Admitting: Vascular Surgery

## 2024-10-15 ENCOUNTER — Other Ambulatory Visit: Payer: Self-pay

## 2024-10-15 ENCOUNTER — Ambulatory Visit: Attending: Vascular Surgery | Admitting: Vascular Surgery

## 2024-10-15 VITALS — BP 178/76 | HR 90 | Temp 97.9°F

## 2024-10-15 DIAGNOSIS — N186 End stage renal disease: Secondary | ICD-10-CM

## 2024-10-15 NOTE — Progress Notes (Signed)
 Patient ID: Katie Garcia, female   DOB: July 28, 1941, 83 y.o.   MRN: [id number]  Reason for Consult: Follow-up   Referred by Elliot Charm,*  Subjective:     HPI:  Katie Garcia is a 83 y.o. female history of multiple bilateral upper extremity dialysis access is currently dialyzing via right IJ tunnel catheter.  She states that she is having some difficulty with dialysis via the catheter.  She most recently had a right upper extremity axillary loop graft which has thrombosed over 1 month ago.  She is here to discuss options moving forward.  Past Medical History:  Diagnosis Date   Anemia    Arthritis    COVID    hospitalized   Diabetes mellitus without complication (HCC)    ESRD    Dialysis T/Th/Sa at [address]   GERD (gastroesophageal reflux disease)    Gout    Headache    Hypertension    Type 2 diabetes mellitus (HCC)    Family History  Problem Relation Age of Onset   Diabetes Mother    Past Surgical History:  Procedure Laterality Date   A/V FISTULAGRAM Left 04/05/2022   Procedure: A/V Fistulagram;  Surgeon: Lanis Fonda BRAVO, MD;  Location: [facility] INVASIVE CV LAB;  Service: Cardiovascular;  Laterality: Left;   A/V FISTULAGRAM Right 04/14/2024   Procedure: A/V Fistulagram;  Surgeon: Sheree Penne Bruckner, MD;  Location: [facility] INVASIVE CV LAB;  Service: Cardiovascular;  Laterality: Right;   A/V FISTULAGRAM  05/12/2024   Procedure: A/V Fistulagram;  Surgeon: Magda Debby SAILOR, MD;  Location: [facility];  Service: Cardiovascular;;   A/V SHUNT INTERVENTION N/A 05/12/2024   Procedure: A/V SHUNT INTERVENTION;  Surgeon: Magda Debby SAILOR, MD;  Location: [facility];  Service: Cardiovascular;  Laterality: N/A;   ABDOMINAL HYSTERECTOMY     AV FISTULA PLACEMENT Left 10/02/2016   Procedure: ARTERIOVENOUS (AV) FISTULA CREATION LEFT UPPER ARM;  Surgeon: Penne Bruckner Sheree, MD;  Location: [facility] OR;  Service: Vascular;  Laterality: Left;   AV FISTULA PLACEMENT Right 07/14/2020    Procedure: RIGHT ARM ARTERIOVENOUS FISTULA CREATION;  Surgeon: Sheree Penne Bruckner, MD;  Location: [facility] OR;  Service: Vascular;  Laterality: Right;   AV FISTULA PLACEMENT Left 05/16/2021   Procedure: INSERTION OF LEFT UPPER ARM ARTERIOVENOUS (AV) GORE-TEX GRAFT;  Surgeon: Gretta Bruckner PARAS, MD;  Location: [facility] OR;  Service: Vascular;  Laterality: Left;   AV FISTULA PLACEMENT Right 07/13/2021   Procedure: RIGHT ARM ARTERIOVENOUS (AV) FISTULA GRAFT INSERTION;  Surgeon: Sheree Penne Bruckner, MD;  Location: [facility] OR;  Service: Vascular;  Laterality: Right;   AV FISTULA PLACEMENT Left 02/03/2022   Procedure: LEFT UPPER ARM ARTERIOVENOUS FISTULA CREATION;  Surgeon: Sheree Penne Bruckner, MD;  Location: [facility] OR;  Service: Vascular;  Laterality: Left;   AV FISTULA PLACEMENT Left 06/23/2022   Procedure: INSERTION OF LEFT ARM ARTERIOVENOUS (AV) GORE-TEX GRAFT;  Surgeon: Sheree Penne Bruckner, MD;  Location: [facility] OR;  Service: Vascular;  Laterality: Left;   AV FISTULA PLACEMENT Right 03/07/2024   Procedure: INSERTION OF ARTERIOVENOUS (AV) GORE-TEX GRAFT ARM;  Surgeon: Sheree Penne Bruckner, MD;  Location: [facility] OR;  Service: Vascular;  Laterality: Right;   BASCILIC VEIN TRANSPOSITION Left 05/17/2020   Procedure: Insertion of Left arm arteriovenous gortex graftarm ;  Surgeon: Eliza Bruckner RAMAN, MD;  Location: [facility] OR;  Service: Vascular;  Laterality: Left;   CESAREAN SECTION     DIALYSIS/PERMA CATHETER INSERTION     DIALYSIS/PERMA CATHETER INSERTION  12/10/2023  Procedure: DIALYSIS/PERMA CATHETER INSERTION;  Surgeon: Pearline Norman RAMAN, MD;  Location: [facility] INVASIVE CV LAB;  Service: Cardiovascular;;   DIALYSIS/PERMA CATHETER INSERTION N/A 01/18/2024   Procedure: DIALYSIS/PERMA CATHETER INSERTION;  Surgeon: Norine Manuelita LABOR, MD;  Location: [facility] INVASIVE CV LAB;  Service: Cardiovascular;  Laterality: N/A;   IR FLUORO GUIDE CV LINE LEFT  02/09/2022   IR PTA VENOUS EXCEPT DIALYSIS CIRCUIT  02/09/2022   IR  TUNNELED CENTRAL VENOUS CATH PLC W IMG  09/17/2024   IR VENOCAVAGRAM SVC  02/09/2022   PERIPHERAL VASCULAR THROMBECTOMY N/A 12/10/2023   Procedure: PERIPHERAL VASCULAR THROMBECTOMY;  Surgeon: Pearline Norman RAMAN, MD;  Location: [facility] INVASIVE CV LAB;  Service: Cardiovascular;  Laterality: N/A;   PERIPHERAL VASCULAR THROMBECTOMY  05/12/2024   Procedure: PERIPHERAL VASCULAR THROMBECTOMY;  Surgeon: Magda Debby SAILOR, MD;  Location: [facility];  Service: Cardiovascular;;   REVISON OF ARTERIOVENOUS FISTULA Right 09/15/2020   Procedure: REVISON OF RIGHT UPPER ARM  ARTERIOVENOUS FISTULA;  Surgeon: Sheree Penne Bruckner, MD;  Location: [facility] OR;  Service: Vascular;  Laterality: Right;   UPPER EXTREMITY VENOGRAPHY Bilateral 04/18/2021   Procedure: UPPER EXTREMITY VENOGRAPHY;  Surgeon: Sheree Penne Bruckner, MD;  Location: [facility] INVASIVE CV LAB;  Service: Cardiovascular;  Laterality: Bilateral;   UPPER EXTREMITY VENOGRAPHY Left 01/23/2022   Procedure: UPPER EXTREMITY VENOGRAPHY;  Surgeon: Sheree Penne Bruckner, MD;  Location: [facility] INVASIVE CV LAB;  Service: Cardiovascular;  Laterality: Left;   UPPER EXTREMITY VENOGRAPHY Bilateral 02/25/2024   Procedure: UPPER EXTREMITY VENOGRAPHY;  Surgeon: Sheree Penne Bruckner, MD;  Location: [facility] INVASIVE CV LAB;  Service: Cardiovascular;  Laterality: Bilateral;    Short Social History:  Social History   Tobacco Use   Smoking status: Never   Smokeless tobacco: Never  Substance Use Topics   Alcohol use: Not Currently    Allergies  Allergen Reactions   Penicillins Rash    Current Outpatient Medications  Medication Sig Dispense Refill   acetaminophen  (TYLENOL ) 500 MG tablet Take 500-1,000 mg by mouth every 6 (six) hours as needed for moderate pain or headache.     allopurinol  (ZYLOPRIM ) 100 MG tablet Take 100 mg by mouth every Tuesday, Thursday, and Saturday at 6 PM. In the morning after dialysis     aspirin  EC 81 MG tablet Take 1 tablet (81 mg total) by mouth daily.  Swallow whole. 150 tablet 2   B Complex-C-Folic Acid  (DIALYVITE 800) 0.8 MG TABS Take 1 tablet by mouth every evening.     calcitRIOL  (ROCALTROL ) 0.25 MCG capsule Take 3 capsules (0.75 mcg total) by mouth Every Tuesday,Thursday,and Saturday with dialysis. 36 capsule 0   calcium  acetate (PHOSLO ) 667 MG capsule Take 1,334 mg by mouth 3 (three) times daily with meals.  3   carvedilol  (COREG ) 12.5 MG tablet Take 6.25 mg by mouth in the morning and at bedtime.     Continuous Glucose Sensor (DEXCOM G7 SENSOR) MISC CHANGE EVERY 10 DAYS 3 each 2   Dextromethorphan-guaiFENesin  10-200 MG/5ML LIQD Take 3 mLs by mouth daily as needed (Cough).     guaifenesin  (ROBITUSSIN) 100 MG/5ML syrup Take 100 mg by mouth 3 (three) times daily as needed for cough.     insulin  glargine (LANTUS  SOLOSTAR) 100 UNIT/ML Solostar Pen INJECT 30 UNITS INTO THE SKIN DAILY. ADJUST AS DIRECTED (Patient taking differently: Inject 20 Units into the skin at bedtime. Adjust as directed) 45 mL 1   insulin  regular (NOVOLIN R) 100 units/mL injection Inject 20 Units into the skin daily before breakfast.  loratadine  (CLARITIN ) 10 MG tablet Take 10 mg by mouth daily.     rosuvastatin  (CRESTOR ) 10 MG tablet Take 10 mg by mouth in the morning.     SYMBICORT 160-4.5 MCG/ACT inhaler Inhale 2 puffs into the lungs 2 (two) times daily as needed (Asthma).     Current Facility-Administered Medications  Medication Dose Route Frequency Provider Last Rate Last Admin   0.9 %  sodium chloride  infusion  250 mL Intravenous PRN Sheree Penne Bruckner, MD        Review of Systems  Constitutional:  Constitutional negative. HENT: HENT negative.  Eyes: Eyes negative.  Respiratory: Respiratory negative.  Cardiovascular: Cardiovascular negative.  GI: Gastrointestinal negative.  Musculoskeletal: Musculoskeletal negative.  Skin: Skin negative.  Neurological: Neurological negative. Hematologic: Hematologic/lymphatic negative.  Psychiatric: Psychiatric  negative.        Objective:  Objective   Vitals:   10/15/24 0946  BP: (!) 178/76  Pulse: 90  Temp: 97.9 F (36.6 C)  SpO2: 97%   There is no height or weight on file to calculate BMI.  Physical Exam HENT:     Head: Normocephalic.     Mouth/Throat:     Mouth: Mucous membranes are moist.  Cardiovascular:     Comments: Strong brachial artery pulses bilaterally Pulmonary:     Effort: Pulmonary effort is normal.  Abdominal:     General: Abdomen is flat.  Musculoskeletal:     Comments: All upper extremity incisions are well-healed there are multiple palpable previous accesses bilaterally  Skin:    Capillary Refill: Capillary refill takes less than 2 seconds.  Neurological:     General: No focal deficit present.     Mental Status: She is alert.     Data: No new studies     Assessment/Plan:    83 year old female with end-stage renal disease currently dialyzing via right IJ tunneled catheter.  She does not have many options for further access in her upper extremities and does not appear to be a suitable lower extremity graft candidate.  We have discussed her options which are likely limited to the left upper extremity being high risk graft versus hero graft.  Will plan to repeat venography given that her last venography was almost 8 months ago.  After venography we can plan left upper extremity graft or HeRO as indicated.     Penne Bruckner Sheree MD [facility]

## 2024-10-15 NOTE — H&P (View-Only) (Signed)
 Patient ID: Katie Garcia, female   DOB: July 28, 1941, 83 y.o.   MRN: [id number]  Reason for Consult: Follow-up   Referred by Elliot Charm,*  Subjective:     HPI:  Katie Garcia is a 83 y.o. female history of multiple bilateral upper extremity dialysis access is currently dialyzing via right IJ tunnel catheter.  She states that she is having some difficulty with dialysis via the catheter.  She most recently had a right upper extremity axillary loop graft which has thrombosed over 1 month ago.  She is here to discuss options moving forward.  Past Medical History:  Diagnosis Date   Anemia    Arthritis    COVID    hospitalized   Diabetes mellitus without complication (HCC)    ESRD    Dialysis T/Th/Sa at [address]   GERD (gastroesophageal reflux disease)    Gout    Headache    Hypertension    Type 2 diabetes mellitus (HCC)    Family History  Problem Relation Age of Onset   Diabetes Mother    Past Surgical History:  Procedure Laterality Date   A/V FISTULAGRAM Left 04/05/2022   Procedure: A/V Fistulagram;  Surgeon: Lanis Fonda BRAVO, MD;  Location: [facility] INVASIVE CV LAB;  Service: Cardiovascular;  Laterality: Left;   A/V FISTULAGRAM Right 04/14/2024   Procedure: A/V Fistulagram;  Surgeon: Sheree Penne Bruckner, MD;  Location: [facility] INVASIVE CV LAB;  Service: Cardiovascular;  Laterality: Right;   A/V FISTULAGRAM  05/12/2024   Procedure: A/V Fistulagram;  Surgeon: Magda Debby SAILOR, MD;  Location: [facility];  Service: Cardiovascular;;   A/V SHUNT INTERVENTION N/A 05/12/2024   Procedure: A/V SHUNT INTERVENTION;  Surgeon: Magda Debby SAILOR, MD;  Location: [facility];  Service: Cardiovascular;  Laterality: N/A;   ABDOMINAL HYSTERECTOMY     AV FISTULA PLACEMENT Left 10/02/2016   Procedure: ARTERIOVENOUS (AV) FISTULA CREATION LEFT UPPER ARM;  Surgeon: Penne Bruckner Sheree, MD;  Location: [facility] OR;  Service: Vascular;  Laterality: Left;   AV FISTULA PLACEMENT Right 07/14/2020    Procedure: RIGHT ARM ARTERIOVENOUS FISTULA CREATION;  Surgeon: Sheree Penne Bruckner, MD;  Location: [facility] OR;  Service: Vascular;  Laterality: Right;   AV FISTULA PLACEMENT Left 05/16/2021   Procedure: INSERTION OF LEFT UPPER ARM ARTERIOVENOUS (AV) GORE-TEX GRAFT;  Surgeon: Gretta Bruckner PARAS, MD;  Location: [facility] OR;  Service: Vascular;  Laterality: Left;   AV FISTULA PLACEMENT Right 07/13/2021   Procedure: RIGHT ARM ARTERIOVENOUS (AV) FISTULA GRAFT INSERTION;  Surgeon: Sheree Penne Bruckner, MD;  Location: [facility] OR;  Service: Vascular;  Laterality: Right;   AV FISTULA PLACEMENT Left 02/03/2022   Procedure: LEFT UPPER ARM ARTERIOVENOUS FISTULA CREATION;  Surgeon: Sheree Penne Bruckner, MD;  Location: [facility] OR;  Service: Vascular;  Laterality: Left;   AV FISTULA PLACEMENT Left 06/23/2022   Procedure: INSERTION OF LEFT ARM ARTERIOVENOUS (AV) GORE-TEX GRAFT;  Surgeon: Sheree Penne Bruckner, MD;  Location: [facility] OR;  Service: Vascular;  Laterality: Left;   AV FISTULA PLACEMENT Right 03/07/2024   Procedure: INSERTION OF ARTERIOVENOUS (AV) GORE-TEX GRAFT ARM;  Surgeon: Sheree Penne Bruckner, MD;  Location: [facility] OR;  Service: Vascular;  Laterality: Right;   BASCILIC VEIN TRANSPOSITION Left 05/17/2020   Procedure: Insertion of Left arm arteriovenous gortex graftarm ;  Surgeon: Eliza Bruckner RAMAN, MD;  Location: [facility] OR;  Service: Vascular;  Laterality: Left;   CESAREAN SECTION     DIALYSIS/PERMA CATHETER INSERTION     DIALYSIS/PERMA CATHETER INSERTION  12/10/2023  Procedure: DIALYSIS/PERMA CATHETER INSERTION;  Surgeon: Pearline Norman RAMAN, MD;  Location: [facility] INVASIVE CV LAB;  Service: Cardiovascular;;   DIALYSIS/PERMA CATHETER INSERTION N/A 01/18/2024   Procedure: DIALYSIS/PERMA CATHETER INSERTION;  Surgeon: Norine Manuelita LABOR, MD;  Location: [facility] INVASIVE CV LAB;  Service: Cardiovascular;  Laterality: N/A;   IR FLUORO GUIDE CV LINE LEFT  02/09/2022   IR PTA VENOUS EXCEPT DIALYSIS CIRCUIT  02/09/2022   IR  TUNNELED CENTRAL VENOUS CATH PLC W IMG  09/17/2024   IR VENOCAVAGRAM SVC  02/09/2022   PERIPHERAL VASCULAR THROMBECTOMY N/A 12/10/2023   Procedure: PERIPHERAL VASCULAR THROMBECTOMY;  Surgeon: Pearline Norman RAMAN, MD;  Location: [facility] INVASIVE CV LAB;  Service: Cardiovascular;  Laterality: N/A;   PERIPHERAL VASCULAR THROMBECTOMY  05/12/2024   Procedure: PERIPHERAL VASCULAR THROMBECTOMY;  Surgeon: Magda Debby SAILOR, MD;  Location: [facility];  Service: Cardiovascular;;   REVISON OF ARTERIOVENOUS FISTULA Right 09/15/2020   Procedure: REVISON OF RIGHT UPPER ARM  ARTERIOVENOUS FISTULA;  Surgeon: Sheree Penne Bruckner, MD;  Location: [facility] OR;  Service: Vascular;  Laterality: Right;   UPPER EXTREMITY VENOGRAPHY Bilateral 04/18/2021   Procedure: UPPER EXTREMITY VENOGRAPHY;  Surgeon: Sheree Penne Bruckner, MD;  Location: [facility] INVASIVE CV LAB;  Service: Cardiovascular;  Laterality: Bilateral;   UPPER EXTREMITY VENOGRAPHY Left 01/23/2022   Procedure: UPPER EXTREMITY VENOGRAPHY;  Surgeon: Sheree Penne Bruckner, MD;  Location: [facility] INVASIVE CV LAB;  Service: Cardiovascular;  Laterality: Left;   UPPER EXTREMITY VENOGRAPHY Bilateral 02/25/2024   Procedure: UPPER EXTREMITY VENOGRAPHY;  Surgeon: Sheree Penne Bruckner, MD;  Location: [facility] INVASIVE CV LAB;  Service: Cardiovascular;  Laterality: Bilateral;    Short Social History:  Social History   Tobacco Use   Smoking status: Never   Smokeless tobacco: Never  Substance Use Topics   Alcohol use: Not Currently    Allergies  Allergen Reactions   Penicillins Rash    Current Outpatient Medications  Medication Sig Dispense Refill   acetaminophen  (TYLENOL ) 500 MG tablet Take 500-1,000 mg by mouth every 6 (six) hours as needed for moderate pain or headache.     allopurinol  (ZYLOPRIM ) 100 MG tablet Take 100 mg by mouth every Tuesday, Thursday, and Saturday at 6 PM. In the morning after dialysis     aspirin  EC 81 MG tablet Take 1 tablet (81 mg total) by mouth daily.  Swallow whole. 150 tablet 2   B Complex-C-Folic Acid  (DIALYVITE 800) 0.8 MG TABS Take 1 tablet by mouth every evening.     calcitRIOL  (ROCALTROL ) 0.25 MCG capsule Take 3 capsules (0.75 mcg total) by mouth Every Tuesday,Thursday,and Saturday with dialysis. 36 capsule 0   calcium  acetate (PHOSLO ) 667 MG capsule Take 1,334 mg by mouth 3 (three) times daily with meals.  3   carvedilol  (COREG ) 12.5 MG tablet Take 6.25 mg by mouth in the morning and at bedtime.     Continuous Glucose Sensor (DEXCOM G7 SENSOR) MISC CHANGE EVERY 10 DAYS 3 each 2   Dextromethorphan-guaiFENesin  10-200 MG/5ML LIQD Take 3 mLs by mouth daily as needed (Cough).     guaifenesin  (ROBITUSSIN) 100 MG/5ML syrup Take 100 mg by mouth 3 (three) times daily as needed for cough.     insulin  glargine (LANTUS  SOLOSTAR) 100 UNIT/ML Solostar Pen INJECT 30 UNITS INTO THE SKIN DAILY. ADJUST AS DIRECTED (Patient taking differently: Inject 20 Units into the skin at bedtime. Adjust as directed) 45 mL 1   insulin  regular (NOVOLIN R) 100 units/mL injection Inject 20 Units into the skin daily before breakfast.  loratadine  (CLARITIN ) 10 MG tablet Take 10 mg by mouth daily.     rosuvastatin  (CRESTOR ) 10 MG tablet Take 10 mg by mouth in the morning.     SYMBICORT 160-4.5 MCG/ACT inhaler Inhale 2 puffs into the lungs 2 (two) times daily as needed (Asthma).     Current Facility-Administered Medications  Medication Dose Route Frequency Provider Last Rate Last Admin   0.9 %  sodium chloride  infusion  250 mL Intravenous PRN Sheree Penne Bruckner, MD        Review of Systems  Constitutional:  Constitutional negative. HENT: HENT negative.  Eyes: Eyes negative.  Respiratory: Respiratory negative.  Cardiovascular: Cardiovascular negative.  GI: Gastrointestinal negative.  Musculoskeletal: Musculoskeletal negative.  Skin: Skin negative.  Neurological: Neurological negative. Hematologic: Hematologic/lymphatic negative.  Psychiatric: Psychiatric  negative.        Objective:  Objective   Vitals:   10/15/24 0946  BP: (!) 178/76  Pulse: 90  Temp: 97.9 F (36.6 C)  SpO2: 97%   There is no height or weight on file to calculate BMI.  Physical Exam HENT:     Head: Normocephalic.     Mouth/Throat:     Mouth: Mucous membranes are moist.  Cardiovascular:     Comments: Strong brachial artery pulses bilaterally Pulmonary:     Effort: Pulmonary effort is normal.  Abdominal:     General: Abdomen is flat.  Musculoskeletal:     Comments: All upper extremity incisions are well-healed there are multiple palpable previous accesses bilaterally  Skin:    Capillary Refill: Capillary refill takes less than 2 seconds.  Neurological:     General: No focal deficit present.     Mental Status: She is alert.     Data: No new studies     Assessment/Plan:    83 year old female with end-stage renal disease currently dialyzing via right IJ tunneled catheter.  She does not have many options for further access in her upper extremities and does not appear to be a suitable lower extremity graft candidate.  We have discussed her options which are likely limited to the left upper extremity being high risk graft versus hero graft.  Will plan to repeat venography given that her last venography was almost 8 months ago.  After venography we can plan left upper extremity graft or HeRO as indicated.     Penne Bruckner Sheree MD [facility]

## 2024-10-20 ENCOUNTER — Ambulatory Visit (HOSPITAL_COMMUNITY)
Admission: RE | Admit: 2024-10-20 | Discharge: 2024-10-20 | Disposition: A | Discharge status: Home / Self Care | Attending: Vascular Surgery | Admitting: Vascular Surgery

## 2024-10-20 ENCOUNTER — Other Ambulatory Visit: Payer: Self-pay

## 2024-10-20 ENCOUNTER — Encounter (HOSPITAL_COMMUNITY)
Admission: RE | Disposition: A | Discharge status: Home / Self Care | Payer: Self-pay | Source: Home / Self Care | Attending: Vascular Surgery

## 2024-10-20 DIAGNOSIS — Z01818 Encounter for other preprocedural examination: Secondary | ICD-10-CM | POA: Diagnosis not present

## 2024-10-20 DIAGNOSIS — E1122 Type 2 diabetes mellitus with diabetic chronic kidney disease: Secondary | ICD-10-CM | POA: Insufficient documentation

## 2024-10-20 DIAGNOSIS — N186 End stage renal disease: Secondary | ICD-10-CM | POA: Insufficient documentation

## 2024-10-20 DIAGNOSIS — Z992 Dependence on renal dialysis: Secondary | ICD-10-CM | POA: Insufficient documentation

## 2024-10-20 DIAGNOSIS — T82868A Thrombosis of vascular prosthetic devices, implants and grafts, initial encounter: Secondary | ICD-10-CM | POA: Insufficient documentation

## 2024-10-20 DIAGNOSIS — Y832 Surgical operation with anastomosis, bypass or graft as the cause of abnormal reaction of the patient, or of later complication, without mention of misadventure at the time of the procedure: Secondary | ICD-10-CM | POA: Diagnosis not present

## 2024-10-20 DIAGNOSIS — Z794 Long term (current) use of insulin: Secondary | ICD-10-CM | POA: Insufficient documentation

## 2024-10-20 DIAGNOSIS — Z79899 Other long term (current) drug therapy: Secondary | ICD-10-CM | POA: Diagnosis not present

## 2024-10-20 DIAGNOSIS — I120 Hypertensive chronic kidney disease with stage 5 chronic kidney disease or end stage renal disease: Secondary | ICD-10-CM | POA: Diagnosis not present

## 2024-10-20 HISTORY — PX: UPPER EXTREMITY VENOGRAPHY: CATH118272

## 2024-10-20 LAB — POCT I-STAT, CHEM 8
BUN: 58 mg/dL — ABNORMAL HIGH (ref 8–23)
Calcium, Ion: 1.23 mmol/L (ref 1.15–1.40)
Chloride: 96 mmol/L — ABNORMAL LOW (ref 98–111)
Creatinine, Ser: 9.1 mg/dL — ABNORMAL HIGH (ref 0.44–1.00)
Glucose, Bld: 186 mg/dL — ABNORMAL HIGH (ref 70–99)
HCT: 34 % — ABNORMAL LOW (ref 36.0–46.0)
Hemoglobin: 11.6 g/dL — ABNORMAL LOW (ref 12.0–15.0)
Potassium: 4.5 mmol/L (ref 3.5–5.1)
Sodium: 135 mmol/L (ref 135–145)
TCO2: 28 mmol/L (ref 22–32)

## 2024-10-20 SURGERY — UPPER EXTREMITY VENOGRAPHY
Anesthesia: LOCAL | Laterality: Left

## 2024-10-20 MED ORDER — SODIUM CHLORIDE 0.9% FLUSH: 3.0000 mL | INTRAVENOUS | Status: DC | PRN

## 2024-10-20 MED ORDER — IODIXANOL 320 MG/ML IV SOLN
INTRAVENOUS | Status: DC | PRN
  Administered 2024-10-20: 25 mL via INTRAVENOUS

## 2024-10-20 NOTE — Op Note (Signed)
    Patient name: Jaleen Grupp Ackerley MRN: [id number] DOB: 02-May-1941 Sex: female  10/20/2024 Pre-operative Diagnosis: End-stage renal disease, need for permanent dialysis access Post-operative diagnosis:  Same Surgeon:  Penne BROCKS. Sheree, MD Procedure Performed: Left upper extremity venogram  Indications: 83 year old female with end-stage renal disease.  She has multiple previous bilateral upper extremity accesses and is now in need of further access given that most recent right upper arm graft has thrombosed.  She does not appear to be a lower extremity candidate.  We are planning for venography to plan future access.  Findings: Centrally the veins on the left appear patent from the axillary into the subclavian and innominate and appears to drain centrally to the SVC.  Plan will be for left upper arm axillary artery to axillary vein loop graft on a nondialysis day in the near future.   Procedure:  The patient was identified in the holding area and taken to room 8.  The patient was then placed supine on the table and prepped and draped in the usual sterile fashion.  A time out was called.  The existing IV in the left forearm was accessed and contrast was injected and we were able to visualize all the veins of the left upper extremity.  We will plan for left upper extremity graft on a nondialysis day in the near future.  Contrast: 25cc   Shanda Cadotte C. Sheree, MD [facility] [place] Office: [phone] Pager: [phone]

## 2024-10-20 NOTE — Interval H&P Note (Signed)
 History and Physical Interval Note:  10/20/2024 10:41 AM  Katie Garcia  has presented today for surgery, with the diagnosis of instage renal.  The various methods of treatment have been discussed with the patient and family. After consideration of risks, benefits and other options for treatment, the patient has consented to  Procedure(s): UPPER EXTREMITY VENOGRAPHY (N/A) as a surgical intervention.  The patient's history has been reviewed, patient examined, no change in status, stable for surgery.  I have reviewed the patient's chart and labs.  Questions were answered to the patient's satisfaction.     [name]

## 2024-10-21 ENCOUNTER — Other Ambulatory Visit: Payer: Self-pay

## 2024-10-21 ENCOUNTER — Encounter (HOSPITAL_COMMUNITY): Payer: Self-pay | Admitting: Vascular Surgery

## 2024-10-21 DIAGNOSIS — N186 End stage renal disease: Secondary | ICD-10-CM

## 2024-10-23 ENCOUNTER — Other Ambulatory Visit: Payer: Self-pay

## 2024-10-23 ENCOUNTER — Encounter (HOSPITAL_COMMUNITY): Payer: Self-pay | Admitting: Vascular Surgery

## 2024-10-23 NOTE — Progress Notes (Signed)
 PCP - Elliot Charm, MD  Cardiologist -   PPM/ICD - denies Device Orders - n/a Rep Notified - n/a  Chest x-ray - 01-14-23 EKG - 09-15-24 Stress Test -  ECHO - 04-11-18 Cardiac Cath - denies  CPAP - denies  GLP-1 -denies  Fasting Blood Sugar - Per daughter blood sugars range from 119-130 Checks Blood Sugar  Has dexcom, family removed due to surgery  Blood Thinner Instructions: denies Aspirin  Instructions: continue  ERAS Protcol - NPO  COVID TEST- n/a  Anesthesia review: No, Hxof ESRD, DM, HTN  Patient verbally denies any shortness of breath, fever, cough and chest pain during phone call   -------------  SDW INSTRUCTIONS given:  Your procedure is scheduled on October 24, 2024.  Report to [facility] Main Entrance A at 8:45 A.M., and check in at the Admitting office.  Call this number if you have problems the morning of surgery:  [phone]   Remember:  Do not eat or drink after midnight the night before your surgery      Take these medicines the morning of surgery with A SIP OF WATER  acetaminophen  (TYLENOL )  aspirin   carvedilol  (COREG )  guaifenesin  (ROBITUSSIN)  loratadine  (CLARITIN )  rosuvastatin  (CRESTOR )  rosuvastatin  (CRESTOR )   As of today, STOP taking any (unless otherwise instructed by your surgeon) Aleve, Naproxen, Ibuprofen, Motrin, Advil, Goody's, BC's, all herbal medications, fish oil, and all vitamins. WHAT DO I DO ABOUT MY DIABETES MEDICATION?   Do not take oral diabetes medicines (pills) the morning of surgery.  THE NIGHT BEFORE SURGERY, take 10 units of LANTUS  insulin . (Per daughter she only take half if blood sugar is elevated.)      THE MORNING OF SURGERY, take NOVOLIN R if blood sugar greater than 220.  The day of surgery, do not take other diabetes injectables, including Byetta (exenatide), Bydureon (exenatide ER), Victoza (liraglutide), or Trulicity (dulaglutide).  If your CBG is greater than 220 mg/dL, you may take  of  your sliding scale (correction) dose of insulin .   HOW TO MANAGE YOUR DIABETES BEFORE AND AFTER SURGERY  Why is it important to control my blood sugar before and after surgery? Improving blood sugar levels before and after surgery helps healing and can limit problems. A way of improving blood sugar control is eating a healthy diet by:  Eating less sugar and carbohydrates  Increasing activity/exercise  Talking with your doctor about reaching your blood sugar goals High blood sugars (greater than 180 mg/dL) can raise your risk of infections and slow your recovery, so you will need to focus on controlling your diabetes during the weeks before surgery. Make sure that the doctor who takes care of your diabetes knows about your planned surgery including the date and location.  How do I manage my blood sugar before surgery? Check your blood sugar at least 4 times a day, starting 2 days before surgery, to make sure that the level is not too high or low.  Check your blood sugar the morning of your surgery when you wake up and every 2 hours until you get to the Short Stay unit.  If your blood sugar is less than 70 mg/dL, you will need to treat for low blood sugar: Do not take insulin . Treat a low blood sugar (less than 70 mg/dL) with  cup of clear juice (cranberry or apple), 4 glucose tablets, OR glucose gel. Recheck blood sugar in 15 minutes after treatment (to make sure it is greater than 70 mg/dL). If your  blood sugar is not greater than 70 mg/dL on recheck, call [phone] for further instructions. Report your blood sugar to the short stay nurse when you get to Short Stay.  If you are admitted to the hospital after surgery: Your blood sugar will be checked by the staff and you will probably be given insulin  after surgery (instead of oral diabetes medicines) to make sure you have good blood sugar levels. The goal for blood sugar control after surgery is 80-180 mg/dL.                       Do not wear jewelry, make up, or nail polish            Do not wear lotions, powders, perfumes/colognes, or deodorant.            Do not shave 48 hours prior to surgery.  Men may shave face and neck.            Do not bring valuables to the hospital.            [facility] is not responsible for any belongings or valuables.  Do NOT Smoke (Tobacco/Vaping) 24 hours prior to your procedure If you use a CPAP at night, you may bring all equipment for your overnight stay.   Contacts, glasses, dentures or bridgework may not be worn into surgery.      For patients admitted to the hospital, discharge time will be determined by your treatment team.   Patients discharged the day of surgery will not be allowed to drive home, and someone needs to stay with them for 24 hours.    Special instructions:   [facility]- Preparing For Surgery  Before surgery, you can play an important role. Because skin is not sterile, your skin needs to be as free of germs as possible. You can reduce the number of germs on your skin by washing with CHG (chlorahexidine gluconate) Soap before surgery.  CHG is an antiseptic cleaner which kills germs and bonds with the skin to continue killing germs even after washing.    Oral Hygiene is also important to reduce your risk of infection.  Remember - BRUSH YOUR TEETH THE MORNING OF SURGERY WITH YOUR REGULAR TOOTHPASTE  Please do not use if you have an allergy to CHG or antibacterial soaps. If your skin becomes reddened/irritated stop using the CHG.  Do not shave (including legs and underarms) for at least 48 hours prior to first CHG shower. It is OK to shave your face.  Please follow these instructions carefully.   Shower the NIGHT BEFORE SURGERY and the MORNING OF SURGERY with DIAL  Soap.   Pat yourself dry with a CLEAN TOWEL.  Wear CLEAN PAJAMAS to bed the night before surgery  Place CLEAN SHEETS on your bed the night of your first shower and DO NOT SLEEP WITH  PETS.   Day of Surgery: Please shower morning of surgery  Wear Clean/Comfortable clothing the morning of surgery Do not apply any deodorants/lotions.   Remember to brush your teeth WITH YOUR REGULAR TOOTHPASTE.   Questions were answered. Patient verbalized understanding of instructions.

## 2024-10-24 ENCOUNTER — Other Ambulatory Visit (HOSPITAL_COMMUNITY): Payer: Self-pay

## 2024-10-24 ENCOUNTER — Encounter (HOSPITAL_COMMUNITY): Payer: Self-pay | Admitting: Vascular Surgery

## 2024-10-24 ENCOUNTER — Ambulatory Visit (HOSPITAL_COMMUNITY)

## 2024-10-24 ENCOUNTER — Ambulatory Visit (HOSPITAL_COMMUNITY)
Admission: RE | Admit: 2024-10-24 | Discharge: 2024-10-24 | Disposition: A | Discharge status: Home / Self Care | Attending: Vascular Surgery | Admitting: Vascular Surgery

## 2024-10-24 ENCOUNTER — Other Ambulatory Visit: Payer: Self-pay

## 2024-10-24 ENCOUNTER — Encounter (HOSPITAL_COMMUNITY): Admission: RE | Disposition: A | Payer: Self-pay | Source: Home / Self Care | Attending: Vascular Surgery

## 2024-10-24 DIAGNOSIS — Z992 Dependence on renal dialysis: Secondary | ICD-10-CM | POA: Diagnosis not present

## 2024-10-24 DIAGNOSIS — Z794 Long term (current) use of insulin: Secondary | ICD-10-CM | POA: Diagnosis not present

## 2024-10-24 DIAGNOSIS — N186 End stage renal disease: Secondary | ICD-10-CM | POA: Insufficient documentation

## 2024-10-24 DIAGNOSIS — I120 Hypertensive chronic kidney disease with stage 5 chronic kidney disease or end stage renal disease: Secondary | ICD-10-CM | POA: Insufficient documentation

## 2024-10-24 DIAGNOSIS — E1122 Type 2 diabetes mellitus with diabetic chronic kidney disease: Secondary | ICD-10-CM | POA: Insufficient documentation

## 2024-10-24 DIAGNOSIS — I129 Hypertensive chronic kidney disease with stage 1 through stage 4 chronic kidney disease, or unspecified chronic kidney disease: Secondary | ICD-10-CM | POA: Diagnosis not present

## 2024-10-24 HISTORY — PX: INSERTION OF ARTERIOVENOUS (AV) ARTEGRAFT ARM: SHX6779

## 2024-10-24 LAB — POCT I-STAT, CHEM 8
BUN: 30 mg/dL — ABNORMAL HIGH (ref 8–23)
Calcium, Ion: 1.02 mmol/L — ABNORMAL LOW (ref 1.15–1.40)
Chloride: 95 mmol/L — ABNORMAL LOW (ref 98–111)
Creatinine, Ser: 6.2 mg/dL — ABNORMAL HIGH (ref 0.44–1.00)
Glucose, Bld: 187 mg/dL — ABNORMAL HIGH (ref 70–99)
HCT: 33 % — ABNORMAL LOW (ref 36.0–46.0)
Hemoglobin: 11.2 g/dL — ABNORMAL LOW (ref 12.0–15.0)
Potassium: 3.7 mmol/L (ref 3.5–5.1)
Sodium: 135 mmol/L (ref 135–145)
TCO2: 29 mmol/L (ref 22–32)

## 2024-10-24 LAB — GLUCOSE, CAPILLARY
Glucose-Capillary: 173 mg/dL — ABNORMAL HIGH (ref 70–99)
Glucose-Capillary: 197 mg/dL — ABNORMAL HIGH (ref 70–99)
Glucose-Capillary: 183 mg/dL — ABNORMAL HIGH (ref 70–99)

## 2024-10-24 SURGERY — INSERTION, GRAFT, ARTERIOVENOUS, UPPER EXTREMITY
Anesthesia: General | Site: Arm Upper | Laterality: Left

## 2024-10-24 MED ORDER — HEPARIN 6000 UNIT IRRIGATION SOLUTION
Status: DC | PRN
  Administered 2024-10-24: 1

## 2024-10-24 MED ORDER — FENTANYL CITRATE (PF) 100 MCG/2ML IJ SOLN
INTRAMUSCULAR | Status: AC
  Filled 2024-10-24: qty 2

## 2024-10-24 MED ORDER — CHLORHEXIDINE GLUCONATE 0.12 % MT SOLN: 15.0000 mL | Freq: Once | OROMUCOSAL | Status: AC

## 2024-10-24 MED ORDER — CHLORHEXIDINE GLUCONATE 4 % EX SOLN: 60.0000 mL | Freq: Once | CUTANEOUS | Status: DC

## 2024-10-24 MED ORDER — FENTANYL CITRATE (PF) 100 MCG/2ML IJ SOLN: 25.0000 ug | INTRAMUSCULAR | Status: DC | PRN

## 2024-10-24 MED ORDER — VANCOMYCIN HCL IN DEXTROSE 1-5 GM/200ML-% IV SOLN
INTRAVENOUS | Status: AC
  Administered 2024-10-24: 1000 mg via INTRAVENOUS
  Filled 2024-10-24: qty 200

## 2024-10-24 MED ORDER — FENTANYL CITRATE (PF) 100 MCG/2ML IJ SOLN
INTRAMUSCULAR | Status: DC | PRN
  Administered 2024-10-24 (×2): 25 ug via INTRAVENOUS

## 2024-10-24 MED ORDER — PROPOFOL 10 MG/ML IV BOLUS
INTRAVENOUS | Status: DC | PRN
Start: 2024-10-24 — End: 2024-10-24
  Administered 2024-10-24: 200 mg via INTRAVENOUS

## 2024-10-24 MED ORDER — INSULIN ASPART 100 UNIT/ML IJ SOLN
0.0000 [IU] | INTRAMUSCULAR | Status: DC | PRN
  Administered 2024-10-24: 2 [IU] via SUBCUTANEOUS

## 2024-10-24 MED ORDER — OXYCODONE HCL 5 MG PO TABS: 5.0000 mg | ORAL_TABLET | Freq: Once | ORAL | Status: DC | PRN

## 2024-10-24 MED ORDER — CHLORHEXIDINE GLUCONATE 0.12 % MT SOLN
OROMUCOSAL | Status: AC
  Administered 2024-10-24: 15 mL via OROMUCOSAL
  Filled 2024-10-24: qty 15

## 2024-10-24 MED ORDER — ONDANSETRON HCL 4 MG/2ML IJ SOLN
INTRAMUSCULAR | Status: DC | PRN
  Administered 2024-10-24: 4 mg via INTRAVENOUS

## 2024-10-24 MED ORDER — LIDOCAINE 2% (20 MG/ML) 5 ML SYRINGE
INTRAMUSCULAR | Status: DC | PRN
Start: 2024-10-24 — End: 2024-10-24
  Administered 2024-10-24: 100 mg via INTRAVENOUS

## 2024-10-24 MED ORDER — VANCOMYCIN HCL IN DEXTROSE 1-5 GM/200ML-% IV SOLN: 1000.0000 mg | INTRAVENOUS | Status: AC

## 2024-10-24 MED ORDER — ONDANSETRON HCL 4 MG/2ML IJ SOLN: 4.0000 mg | Freq: Once | INTRAMUSCULAR | Status: DC | PRN

## 2024-10-24 MED ORDER — SODIUM CHLORIDE 0.9 % IV SOLN: INTRAVENOUS | Status: DC

## 2024-10-24 MED ORDER — PHENYLEPHRINE HCL-NACL 20-0.9 MG/250ML-% IV SOLN
INTRAVENOUS | Status: DC | PRN
Start: 2024-10-24 — End: 2024-10-24
  Administered 2024-10-24: 20 ug/min via INTRAVENOUS

## 2024-10-24 MED ORDER — LIDOCAINE-EPINEPHRINE (PF) 1 %-1:200000 IJ SOLN
INTRAMUSCULAR | Status: AC
  Filled 2024-10-24: qty 30

## 2024-10-24 MED ORDER — ACETAMINOPHEN 10 MG/ML IV SOLN: 1000.0000 mg | Freq: Once | INTRAVENOUS | Status: DC | PRN

## 2024-10-24 MED ORDER — ORAL CARE MOUTH RINSE: 15.0000 mL | Freq: Once | OROMUCOSAL | Status: AC

## 2024-10-24 MED ORDER — INSULIN ASPART 100 UNIT/ML IJ SOLN
INTRAMUSCULAR | Status: AC
  Filled 2024-10-24: qty 1

## 2024-10-24 MED ORDER — 0.9 % SODIUM CHLORIDE (POUR BTL) OPTIME
TOPICAL | Status: DC | PRN
  Administered 2024-10-24: 1000 mL

## 2024-10-24 MED ORDER — HEPARIN 6000 UNIT IRRIGATION SOLUTION
Status: AC
  Filled 2024-10-24: qty 500

## 2024-10-24 MED ORDER — PHENYLEPHRINE 80 MCG/ML (10ML) SYRINGE FOR IV PUSH (FOR BLOOD PRESSURE SUPPORT)
PREFILLED_SYRINGE | INTRAVENOUS | Status: DC | PRN
  Administered 2024-10-24 (×3): 80 ug via INTRAVENOUS

## 2024-10-24 MED ORDER — OXYCODONE HCL 5 MG/5ML PO SOLN: 5.0000 mg | Freq: Once | ORAL | Status: DC | PRN

## 2024-10-24 MED ORDER — OXYCODONE-ACETAMINOPHEN 5-325 MG PO TABS
1.0000 | ORAL_TABLET | Freq: Four times a day (QID) | ORAL | 0 refills | Status: AC | PRN
  Filled 2024-10-24: qty 20, 5d supply, fill #0

## 2024-10-24 SURGICAL SUPPLY — 30 items
ARMBAND PINK RESTRICT EXTREMIT (MISCELLANEOUS) ×2 IMPLANT
BAG COUNTER SPONGE SURGICOUNT (BAG) ×1 IMPLANT
CANISTER SUCTION 3000ML PPV (SUCTIONS) ×1 IMPLANT
CATH EMB 4FR 40 (CATHETERS) IMPLANT
CLIP LIGATING EXTRA MED SLVR (CLIP) ×1 IMPLANT
CLIP LIGATING EXTRA SM BLUE (MISCELLANEOUS) ×1 IMPLANT
DERMABOND ADVANCED .7 DNX12 (GAUZE/BANDAGES/DRESSINGS) ×1 IMPLANT
ELECTRODE REM PT RTRN 9FT ADLT (ELECTROSURGICAL) ×1 IMPLANT
GLOVE BIO SURGEON STRL SZ7.5 (GLOVE) ×1 IMPLANT
GOWN STRL REUS W/ TWL LRG LVL3 (GOWN DISPOSABLE) ×2 IMPLANT
GOWN STRL REUS W/ TWL XL LVL3 (GOWN DISPOSABLE) ×1 IMPLANT
GRAFT GORETEX STRT 4-7X45 (Vascular Products) IMPLANT
HEMOSTAT SNOW SURGICEL 2X4 (HEMOSTASIS) IMPLANT
INSERT FOGARTY SM (MISCELLANEOUS) ×1 IMPLANT
KIT BASIN OR (CUSTOM PROCEDURE TRAY) ×1 IMPLANT
KIT TURNOVER KIT B (KITS) ×1 IMPLANT
PACK CV ACCESS (CUSTOM PROCEDURE TRAY) ×1 IMPLANT
PAD ARMBOARD POSITIONER FOAM (MISCELLANEOUS) ×2 IMPLANT
POWDER SURGICEL 3.0 GRAM (HEMOSTASIS) IMPLANT
SLING ARM FOAM STRAP LRG (SOFTGOODS) IMPLANT
SOLN 0.9% NACL POUR BTL 1000ML (IV SOLUTION) ×1 IMPLANT
SOLN STERILE WATER BTL 1000 ML (IV SOLUTION) ×1 IMPLANT
STOPCOCK 4WAY DISCOFIX (IV SETS) IMPLANT
SUT MNCRL AB 4-0 PS2 18 (SUTURE) IMPLANT
SUT PROLENE 6 0 BV (SUTURE) IMPLANT
SUT SILK 2 0 SH (SUTURE) IMPLANT
SUT VIC AB 3-0 SH 27X BRD (SUTURE) ×2 IMPLANT
SYR TOOMEY 50ML (SYRINGE) IMPLANT
TOWEL GREEN STERILE (TOWEL DISPOSABLE) ×1 IMPLANT
UNDERPAD 30X36 HEAVY ABSORB (UNDERPADS AND DIAPERS) ×1 IMPLANT

## 2024-10-24 NOTE — Anesthesia Procedure Notes (Signed)
 Procedure Name: LMA Insertion Date/Time: 10/24/2024 12:13 PM  Performed by: Mannie Krystal LABOR, CRNAPre-anesthesia Checklist: Patient identified, Emergency Drugs available, Suction available, Patient being monitored and Timeout performed Patient Re-evaluated:Patient Re-evaluated prior to induction Oxygen Delivery Method: Circle system utilized Preoxygenation: Pre-oxygenation with 100% oxygen Induction Type: IV induction Ventilation: Mask ventilation without difficulty LMA: LMA inserted LMA Size: 4.0 Number of attempts: 1

## 2024-10-24 NOTE — Transfer of Care (Signed)
 Immediate Anesthesia Transfer of Care Note  Patient: Katie Garcia  Procedure(s) Performed: INSERTION, GRAFT, ARTERIOVENOUS, UPPER EXTREMITY (Left: Arm Upper)  Patient Location: PACU  Anesthesia Type:General  Level of Consciousness: awake  Airway & Oxygen Therapy: Patient Spontanous Breathing and Patient connected to face mask oxygen  Post-op Assessment: Report given to RN and Post -op Vital signs reviewed and stable  Post vital signs: Reviewed and stable  Last Vitals:  Vitals Value Taken Time  BP 177/51 10/24/24 13:37  Temp    Pulse 74 10/24/24 13:39  Resp 20 10/24/24 13:39  SpO2 100 % 10/24/24 13:39  Vitals shown include unfiled device data.  Last Pain:  Vitals:   10/24/24 0902  TempSrc:   PainSc: 0-No pain         Complications: There were no known notable events for this encounter.

## 2024-10-24 NOTE — Anesthesia Preprocedure Evaluation (Signed)
 Anesthesia Evaluation  Patient identified by MRN, date of birth, ID band Patient awake    Reviewed: Allergy & Precautions, NPO status , Patient's Chart, lab work & pertinent test results, reviewed documented beta blocker date and time   History of Anesthesia Complications Negative for: history of anesthetic complications  Airway Mallampati: III  TM Distance: >3 FB     Dental  (+) Edentulous Upper, Edentulous Lower   Pulmonary shortness of breath and with exertion, asthma , pneumonia   breath sounds clear to auscultation       Cardiovascular hypertension, + Peripheral Vascular Disease  + Valvular Problems/Murmurs  Rhythm:Regular Rate:Normal  Left ventricle: The cavity size was normal. Wall thickness was    normal. Systolic function was normal. The estimated ejection    fraction was in the range of 55% to 60%. Wall motion was normal;    there were no regional wall motion abnormalities. Features are    consistent with a pseudonormal left ventricular filling pattern,    with concomitant abnormal relaxation and increased filling    pressure (grade 2 diastolic dysfunction).  - Aortic valve: There was no stenosis.  - Mitral valve: There was trivial regurgitation.  - Right ventricle: The cavity size was normal. Systolic function    was normal.  - Tricuspid valve: Peak RV-RA gradient (S): 34 mm Hg.  - Pulmonary arteries: PA peak pressure: 37 mm Hg (S).  - Inferior vena cava: The vessel was normal in size. The    respirophasic diameter changes were in the normal range (>= 50%),    consistent with normal central venous pressure.     Neuro/Psych  Headaches, neg Seizures PSYCHIATRIC DISORDERS  Depression       GI/Hepatic ,GERD  ,,(+) neg Cirrhosis        Endo/Other  diabetes, Type 2    Renal/GU ESRFRenal disease     Musculoskeletal  (+) Arthritis ,    Abdominal   Peds  Hematology  (+) Blood dyscrasia, anemia    Anesthesia Other Findings   Reproductive/Obstetrics                              Anesthesia Physical Anesthesia Plan  ASA: 3  Anesthesia Plan: General   Post-op Pain Management:    Induction: Intravenous  PONV Risk Score and Plan: 2 and Ondansetron  and Dexamethasone   Airway Management Planned: LMA  Additional Equipment:   Intra-op Plan:   Post-operative Plan: Extubation in OR  Informed Consent: I have reviewed the patients History and Physical, chart, labs and discussed the procedure including the risks, benefits and alternatives for the proposed anesthesia with the patient or authorized representative who has indicated his/her understanding and acceptance.     Dental advisory given  Plan Discussed with: CRNA  Anesthesia Plan Comments:          Anesthesia Quick Evaluation

## 2024-10-24 NOTE — Interval H&P Note (Signed)
 History and Physical Interval Note:  10/24/2024 9:59 AM  Katie Garcia  has presented today for surgery, with the diagnosis of ESRD.  The various methods of treatment have been discussed with the patient and family. After consideration of risks, benefits and other options for treatment, the patient has consented to  Procedure(s): INSERTION, GRAFT, ARTERIOVENOUS, UPPER EXTREMITY (Left) as a surgical intervention.  The patient's history has been reviewed, patient examined, no change in status, stable for surgery.  I have reviewed the patient's chart and labs.  Questions were answered to the patient's satisfaction.     [name]

## 2024-10-24 NOTE — Op Note (Signed)
 Patient name: Katie Garcia MRN: [id number] DOB: 06/29/1941 Sex: female  10/24/2024 Pre-operative Diagnosis: End-stage renal disease, need for permanent dialysis access Post-operative diagnosis:  Same Surgeon:  Penne BROCKS. Sheree, MD Assistant: Lucie Apt, PA Procedure Performed:  Left brachial artery to axillary vein AV graft with 4-7 mm PTFE  Indications: 83 year old female with history of end-stage renal disease currently dialyzing via catheter.  She has multiple previous upper extremity accesses and is now indicated for left arm AV graft.  Findings: There was an existing graft in her left axilla this was transected and excised to expose the vein.  There was also a thrombosed vein with an existing stent.  The axillary vein appear occluded and for this reason the graft was sewn to the axillary vein in an end to end fashion.  We elected to use the brachial artery in the mid upper arm rather than the axillary artery due to previous scar tissue and existence of stent and graft in the axilla.  At completion there was a very strong thrill in the graft and a palpable radial artery pulse at the wrist confirmed with Doppler.   Procedure:  The patient was identified in the holding area and taken to the operating room where she was placed supine operative table and LMA anesthesia was induced.  She is sterilely prepped and draped in the left upper extremity usual fashion, antibiotics were administered and a timeout was called.  We began using ultrasound we identified the axillary vein which was compressible.  We made a vertical incision dissected down to the skin and subcutaneous tissue where there was dense scar tissue.  We identified a graft there and we transected this there appeared to be acute appearing thrombus we attempted to pass a Fogarty but the graft was quite sclerotic.  We excised the existing graft in the wound bed.  We then identified one of the axillary veins which appeared thrombosed  secondary to a stent.  The axillary artery was adjacent to this.  Deeper in the wound bed we identified an axillary vein which was patent and compressible.  It did appear to possibly be occluded more peripherally but centrally was notably patent and appeared normal.  I then evaluated the axillary artery but given the amount of scar tissue in the wound I elected possibly using the brachial artery.  In the mid upper arm identified a brachial artery with ultrasound which appeared normal.  A vertical incision was created we dissected down to a normal-appearing brachial artery encircled this with vessel loop.  A 4-7 mm PTFE graft was then tunneled.  We began by tying off the axillary veins peripherally and then transecting it.  Was flushed heparinized saline and clamped.  The graft was trimmed to size and sewn end to end with 6-0 Prolene suture.  Upon completion we then flushed through the graft and reclamped the vein.  The artery was then clamped distally approximate open longitudinally and flushed with heparinized saline in both directions.  The graft was then trimmed to size at the 4 mm end and sewn end to side with 6-0 Prolene suture.  Prior completion without flushing all directions.  Upon completion there was a very strong thrill in the graft and we had confirmed Doppler flow in the axillary vein there is also a palpable radial artery pulse confirmed with Doppler.  Satisfied with this we irrigated the wounds obtained stasis closed in layers of Vicryl and Monocryl.  Dermabond was placed at the skin  level.  She was awakened from anesthesia having tolerated the procedure without any complication.  All counts were correct at completion.  EBL: 50cc  Shakora Nordquist C. Sheree, MD [facility] [place] Office: [phone] Pager: [phone]

## 2024-10-24 NOTE — Discharge Instructions (Signed)
   [facility]  Discharge Instructions  AV Fistula or Graft Surgery for Dialysis Access  Please refer to the following instructions for your post-procedure care. Your surgeon or physician assistant will discuss any changes with you.  Activity  You may drive the day following your surgery, if you are comfortable and no longer taking prescription pain medication. Resume full activity as the soreness in your incision resolves.  Bathing/Showering  You may shower after you go home. Keep your incision dry for 48 hours. Do not soak in a bathtub, hot tub, or swim until the incision heals completely. You may not shower if you have a hemodialysis catheter.  Incision Care  Clean your incision with mild soap and water after 48 hours. Pat the area dry with a clean towel. You do not need a bandage unless otherwise instructed. Do not apply any ointments or creams to your incision. You may have skin glue on your incision. Do not peel it off. It will come off on its own in about one week. Your arm may swell a bit after surgery. To reduce swelling use pillows to elevate your arm so it is above your heart. Your doctor will tell you if you need to lightly wrap your arm with an ACE bandage.  Diet  Resume your normal diet. There are not special food restrictions following this procedure. In order to heal from your surgery, it is CRITICAL to get adequate nutrition. Your body requires vitamins, minerals, and protein. Vegetables are the best source of vitamins and minerals. Vegetables also provide the perfect balance of protein. Processed food has little nutritional value, so try to avoid this.  Medications  Resume taking all of your medications. If your incision is causing pain, you may take over-the counter pain relievers such as acetaminophen  (Tylenol ). If you were prescribed a stronger pain medication, please be aware these medications can cause nausea and constipation. Prevent  nausea by taking the medication with a snack or meal. Avoid constipation by drinking plenty of fluids and eating foods with high amount of fiber, such as fruits, vegetables, and grains.  Do not take Tylenol  if you are taking prescription pain medications.  Follow up Your surgeon may want to see you in the office following your access surgery. If so, this will be arranged at the time of your surgery.  Please call us  immediately for any of the following conditions:  Increased pain, redness, drainage (pus) from your incision site Fever of 101 degrees or higher Severe or worsening pain at your incision site Hand pain or numbness.  Reduce your risk of vascular disease:  Stop smoking. If you would like help, call [facility] at 1-800-QUIT-NOW (623 668 8013) or [place] at (272)425-3449  Manage your cholesterol Maintain a desired weight Control your diabetes Keep your blood pressure down  Dialysis  It will take several weeks to several months for your new dialysis access to be ready for use. Your surgeon will determine when it is okay to use it. Your nephrologist will continue to direct your dialysis. You can continue to use your Permcath until your new access is ready for use.   10/24/2024 Katie Garcia [id number] 30-Sep-1941  Surgeon(s): Sheree Penne Bruckner, MD  Procedure(s): INSERTION, GRAFT, ARTERIOVENOUS, UPPER EXTREMITY  x Do not stick graft for 4 weeks    If you have any questions, please call the office at [phone].

## 2024-10-24 NOTE — Anesthesia Postprocedure Evaluation (Signed)
 Anesthesia Post Note  Patient: Katie Garcia  Procedure(s) Performed: INSERTION, GRAFT, ARTERIOVENOUS, UPPER EXTREMITY (Left: Arm Upper)     Patient location during evaluation: PACU Anesthesia Type: General Level of consciousness: awake and alert Pain management: pain level controlled Vital Signs Assessment: post-procedure vital signs reviewed and stable Respiratory status: spontaneous breathing, nonlabored ventilation, respiratory function stable and patient connected to nasal cannula oxygen Cardiovascular status: blood pressure returned to baseline and stable Postop Assessment: no apparent nausea or vomiting Anesthetic complications: no   There were no known notable events for this encounter.  Last Vitals:  Vitals:   10/24/24 1400 10/24/24 1415  BP: (!) 156/56 (!) 177/77  Pulse: 77 77  Resp: (!) 27 15  Temp:  36.7 C  SpO2: 100% 99%    Last Pain:  Vitals:   10/24/24 1415  TempSrc:   PainSc: 0-No pain                 [name] Cornea

## 2024-10-25 ENCOUNTER — Encounter (HOSPITAL_COMMUNITY): Payer: Self-pay | Admitting: Vascular Surgery

## 2024-11-03 ENCOUNTER — Encounter: Payer: Self-pay | Admitting: Podiatry

## 2024-11-03 ENCOUNTER — Ambulatory Visit (INDEPENDENT_AMBULATORY_CARE_PROVIDER_SITE_OTHER): Admitting: Podiatry

## 2024-11-03 DIAGNOSIS — E0822 Diabetes mellitus due to underlying condition with diabetic chronic kidney disease: Secondary | ICD-10-CM

## 2024-11-03 DIAGNOSIS — N186 End stage renal disease: Secondary | ICD-10-CM

## 2024-11-03 DIAGNOSIS — M79674 Pain in right toe(s): Secondary | ICD-10-CM | POA: Diagnosis not present

## 2024-11-03 DIAGNOSIS — B351 Tinea unguium: Secondary | ICD-10-CM

## 2024-11-03 DIAGNOSIS — Z794 Long term (current) use of insulin: Secondary | ICD-10-CM

## 2024-11-03 DIAGNOSIS — Z992 Dependence on renal dialysis: Secondary | ICD-10-CM

## 2024-11-03 DIAGNOSIS — M79675 Pain in left toe(s): Secondary | ICD-10-CM

## 2024-11-03 NOTE — Progress Notes (Addendum)
 This patient returns to my office for at risk foot care.  This patient requires this care by a professional since this patient will be at risk due to having diabetes with peripheral angiopathy.  This patient is unable to cut nails herself since the patient cannot reach her nails.These nails are painful walking and wearing shoes.  This patient presents for at risk foot care today.  General Appearance  Alert, conversant and in no acute stress.  Vascular  Dorsalis pedis and posterior tibial  pulses are  weakly palpable  bilaterally.  Capillary return is within normal limits  bilaterally. Temperature is within normal limits  bilaterally.  Neurologic  Senn-Weinstein monofilament wire test within normal limits  bilaterally. Muscle power within normal limits bilaterally.  Nails Thick disfigured discolored nails with subungual debris  from hallux to fifth toes bilaterally. No evidence of bacterial infection or drainage bilaterally. Pincer hallux nails.  Orthopedic  No limitations of motion  feet .  No crepitus or effusions noted.  No bony pathology or digital deformities noted.  Skin  normotropic skin with no porokeratosis noted bilaterally.  No signs of infections or ulcers noted.     Onychomycosis  Pain in right toes  Pain in left toes  Consent was obtained for treatment procedures.   Mechanical debridement of nails 1-5  bilaterally performed with a nail nipper.  Filed with dremel without incident.    Return office visit    4  months                  Told patient to return for periodic foot care and evaluation due to potential at risk complications.   Cordella Bold DPM

## 2024-11-24 ENCOUNTER — Telehealth: Payer: Self-pay | Admitting: Vascular Surgery

## 2024-11-24 ENCOUNTER — Telehealth: Payer: Self-pay

## 2024-11-24 NOTE — Telephone Encounter (Signed)
 Patient's daughter, [name], called to report pt's left land and arm are tingling and painful.  She reported cold hand with weak grip.  Pt us  unable to hold a cup.  Schedule appt with PA on 11/25/24

## 2024-11-24 NOTE — Telephone Encounter (Signed)
 Entered in error

## 2024-11-25 ENCOUNTER — Ambulatory Visit: Attending: Vascular Surgery

## 2024-11-25 VITALS — BP 147/75 | HR 90 | Temp 97.7°F

## 2024-11-25 DIAGNOSIS — T82898A Other specified complication of vascular prosthetic devices, implants and grafts, initial encounter: Secondary | ICD-10-CM

## 2024-11-25 DIAGNOSIS — N186 End stage renal disease: Secondary | ICD-10-CM

## 2024-11-25 NOTE — Progress Notes (Signed)
    Postoperative Access Visit   History of Present Illness   Katie Garcia is a 83 y.o. year old female who presents for postoperative follow-up for: Left brachial artery to axillary vein AV graft by Dr. Sheree on 10/24/24. She presents today with her daughter with concerns of left upper arm intermittent sharp pains and hand tingling/numbness, coldness and weakness. This started after her surgery but she reports she also tripped and caught herself on the back of the couch about 2 weeks ago and her arm has been sore since then. She has been concerned she did something to her graft when she fell. She does explain that the cold sensation comes and goes. The weakness seems to be improving. Says she has been trying to  work her hand.   She unfortunately has had failed access in both upper extremities. She is not a good candidate for lower extremity graft.   She currently dialyzes on TTS via right internal jugular TDC  Physical Examination   Vitals:   11/25/24 1039  BP: (!) 147/75  Pulse: 90  Temp: 97.7 F (36.5 C)  TempSrc: Temporal   There is no height or weight on file to calculate BMI.  left arm Incision is well healed, 1+ radial pulse, hand grip is 5/5, sensation in digits is intact, palpable thrill, bruit can  be auscultated. Hand is warm and well perfused    Medical Decision Making   Katie Garcia is a 83 y.o. year old female who presents s/p Left brachial artery to axillary vein AV graft by Dr. Sheree on 10/24/24. Incision is well healed. She is having some mild steal symptoms with some numbness/ tingling in her fingers, cold sensation and weakness. This is all improving though. On exam her graft has great thrill. Her hand is warm and well perfused with good grip strength. Suspect she may have pulled a muscle when she fell and caught herself. Reassured her that it will take time for her to heal from that. I discussed with her and her daughter that with her improving and tolerable  symptoms would encourage her to keep this access since she is running out of access options. She presently doesn't have any ischemic symptoms to warrant immediate ligation of her graft. Provided reassurance that graft is working well and that she did not damage it when she feel. Patient and daughter feel much better now about arm.  The patient's access will be ready for use immediately The patient's tunneled dialysis catheter can be removed when Nephrology is comfortable with the performance of the left AV graft The patient may follow up on a prn basis   [name], PA-C [facility] [place] Office: [phone]  Clinic MD: Magda

## 2024-12-09 ENCOUNTER — Telehealth (HOSPITAL_COMMUNITY): Payer: Self-pay

## 2024-12-09 ENCOUNTER — Other Ambulatory Visit (HOSPITAL_COMMUNITY): Payer: Self-pay | Admitting: Nephrology

## 2024-12-09 DIAGNOSIS — N186 End stage renal disease: Secondary | ICD-10-CM

## 2024-12-09 NOTE — Telephone Encounter (Signed)
 LMOM for pt to call back to get scheduled.GR

## 2024-12-12 ENCOUNTER — Ambulatory Visit (HOSPITAL_COMMUNITY)
Admission: RE | Admit: 2024-12-12 | Discharge: 2024-12-12 | Disposition: A | Discharge status: Home / Self Care | Source: Ambulatory Visit | Attending: Nephrology | Admitting: Nephrology

## 2024-12-12 DIAGNOSIS — Z4901 Encounter for fitting and adjustment of extracorporeal dialysis catheter: Secondary | ICD-10-CM | POA: Diagnosis present

## 2024-12-12 DIAGNOSIS — N186 End stage renal disease: Secondary | ICD-10-CM | POA: Insufficient documentation

## 2024-12-12 HISTORY — PX: IR REMOVAL TUN CV CATH W/O FL: IMG2289

## 2024-12-12 MED ORDER — LIDOCAINE-EPINEPHRINE 1 %-1:100000 IJ SOLN
INTRAMUSCULAR | Status: AC
  Filled 2024-12-12: qty 20

## 2025-01-30 ENCOUNTER — Telehealth: Payer: Self-pay | Admitting: Pharmacy Technician

## 2025-01-30 ENCOUNTER — Other Ambulatory Visit (HOSPITAL_COMMUNITY): Payer: Self-pay

## 2025-01-30 NOTE — Telephone Encounter (Signed)
 Called pt regarding PA. Transferred pt to the front to schedule appt.

## 2025-01-30 NOTE — Telephone Encounter (Signed)
 Pharmacy Patient Advocate Encounter   Received notification from [facility] KEY that prior authorization for Dexcom G7 Sensor  is required/requested.   Insurance verification completed.   The patient is insured through [facility].   Action: Patient hasn't been seen in your office in over a year. Plan requires updated chart notes for PA renewal. PA cancelled.  Archived Key: [id number]

## 2025-02-02 ENCOUNTER — Ambulatory Visit: Admitting: Podiatry

## 2025-03-09 ENCOUNTER — Ambulatory Visit: Admitting: "Endocrinology
